# Patient Record
Sex: Female | Born: 1955
Health system: Southern US, Community
[De-identification: ages and names within clinical notes are randomized; demographics above are authoritative.]

## PROBLEM LIST (undated history)

## (undated) DIAGNOSIS — R011 Cardiac murmur, unspecified: Secondary | ICD-10-CM

## (undated) DIAGNOSIS — H269 Unspecified cataract: Secondary | ICD-10-CM

## (undated) DIAGNOSIS — H409 Unspecified glaucoma: Secondary | ICD-10-CM

## (undated) DIAGNOSIS — G4730 Sleep apnea, unspecified: Secondary | ICD-10-CM

## (undated) DIAGNOSIS — E785 Hyperlipidemia, unspecified: Secondary | ICD-10-CM

## (undated) DIAGNOSIS — F32A Depression, unspecified: Secondary | ICD-10-CM

## (undated) DIAGNOSIS — T8859XA Other complications of anesthesia, initial encounter: Secondary | ICD-10-CM

## (undated) DIAGNOSIS — I341 Nonrheumatic mitral (valve) prolapse: Secondary | ICD-10-CM

## (undated) DIAGNOSIS — N390 Urinary tract infection, site not specified: Secondary | ICD-10-CM

## (undated) DIAGNOSIS — K219 Gastro-esophageal reflux disease without esophagitis: Secondary | ICD-10-CM

## (undated) DIAGNOSIS — E119 Type 2 diabetes mellitus without complications: Secondary | ICD-10-CM

## (undated) DIAGNOSIS — I10 Essential (primary) hypertension: Secondary | ICD-10-CM

## (undated) DIAGNOSIS — F419 Anxiety disorder, unspecified: Secondary | ICD-10-CM

## (undated) DIAGNOSIS — M1990 Unspecified osteoarthritis, unspecified site: Secondary | ICD-10-CM

## (undated) DIAGNOSIS — T7840XA Allergy, unspecified, initial encounter: Secondary | ICD-10-CM

## (undated) DIAGNOSIS — F319 Bipolar disorder, unspecified: Secondary | ICD-10-CM

## (undated) DIAGNOSIS — K5909 Other constipation: Secondary | ICD-10-CM

## (undated) DIAGNOSIS — J189 Pneumonia, unspecified organism: Secondary | ICD-10-CM

## (undated) DIAGNOSIS — F329 Major depressive disorder, single episode, unspecified: Secondary | ICD-10-CM

## (undated) DIAGNOSIS — I499 Cardiac arrhythmia, unspecified: Secondary | ICD-10-CM

## (undated) DIAGNOSIS — T4145XA Adverse effect of unspecified anesthetic, initial encounter: Secondary | ICD-10-CM

## (undated) HISTORY — DX: Sleep apnea, unspecified: G47.30

## (undated) HISTORY — DX: Unspecified glaucoma: H40.9

## (undated) HISTORY — PX: COLONOSCOPY: SHX174

## (undated) HISTORY — PX: TONSILLECTOMY: SUR1361

## (undated) HISTORY — DX: Allergy, unspecified, initial encounter: T78.40XA

## (undated) HISTORY — DX: Type 2 diabetes mellitus without complications: E11.9

## (undated) HISTORY — DX: Hyperlipidemia, unspecified: E78.5

## (undated) HISTORY — DX: Essential (primary) hypertension: I10

## (undated) HISTORY — DX: Depression, unspecified: F32.A

## (undated) HISTORY — DX: Unspecified cataract: H26.9

## (undated) HISTORY — DX: Nonrheumatic mitral (valve) prolapse: I34.1

## (undated) HISTORY — DX: Bipolar disorder, unspecified: F31.9

## (undated) HISTORY — PX: CATARACT EXTRACTION: SUR2

## (undated) HISTORY — DX: Major depressive disorder, single episode, unspecified: F32.9

---

## 1998-05-09 ENCOUNTER — Inpatient Hospital Stay (HOSPITAL_COMMUNITY): Admission: RE | Admit: 1998-05-09 | Discharge: 1998-05-10 | Payer: Self-pay | Admitting: Neurosurgery

## 1998-08-09 ENCOUNTER — Other Ambulatory Visit: Admission: RE | Admit: 1998-08-09 | Discharge: 1998-08-09 | Payer: Self-pay | Admitting: Family Medicine

## 1999-09-05 ENCOUNTER — Other Ambulatory Visit: Admission: RE | Admit: 1999-09-05 | Discharge: 1999-09-05 | Payer: Self-pay | Admitting: Family Medicine

## 1999-09-24 ENCOUNTER — Encounter: Admission: RE | Admit: 1999-09-24 | Discharge: 1999-09-24 | Payer: Self-pay | Admitting: Family Medicine

## 1999-09-24 ENCOUNTER — Encounter: Payer: Self-pay | Admitting: Family Medicine

## 1999-11-10 HISTORY — PX: BACK SURGERY: SHX140

## 2000-05-17 ENCOUNTER — Encounter: Payer: Self-pay | Admitting: Family Medicine

## 2000-10-07 ENCOUNTER — Encounter: Admission: RE | Admit: 2000-10-07 | Discharge: 2000-10-07 | Payer: Self-pay | Admitting: Family Medicine

## 2000-10-07 ENCOUNTER — Encounter: Payer: Self-pay | Admitting: Family Medicine

## 2000-11-09 HISTORY — PX: ABDOMINAL HYSTERECTOMY: SHX81

## 2000-11-10 ENCOUNTER — Encounter (INDEPENDENT_AMBULATORY_CARE_PROVIDER_SITE_OTHER): Payer: Self-pay

## 2000-11-10 ENCOUNTER — Other Ambulatory Visit: Admission: RE | Admit: 2000-11-10 | Discharge: 2000-11-10 | Payer: Self-pay | Admitting: Obstetrics & Gynecology

## 2000-12-14 ENCOUNTER — Encounter (INDEPENDENT_AMBULATORY_CARE_PROVIDER_SITE_OTHER): Payer: Self-pay

## 2000-12-14 ENCOUNTER — Inpatient Hospital Stay (HOSPITAL_COMMUNITY): Admission: RE | Admit: 2000-12-14 | Discharge: 2000-12-16 | Payer: Self-pay | Admitting: Obstetrics & Gynecology

## 2001-03-28 ENCOUNTER — Encounter: Payer: Self-pay | Admitting: Internal Medicine

## 2001-03-28 ENCOUNTER — Inpatient Hospital Stay (HOSPITAL_COMMUNITY): Admission: EM | Admit: 2001-03-28 | Discharge: 2001-04-05 | Payer: Self-pay | Admitting: Psychiatry

## 2001-04-06 ENCOUNTER — Other Ambulatory Visit (HOSPITAL_COMMUNITY): Admission: RE | Admit: 2001-04-06 | Discharge: 2001-04-15 | Payer: Self-pay | Admitting: Psychiatry

## 2001-07-27 ENCOUNTER — Emergency Department (HOSPITAL_COMMUNITY): Admission: EM | Admit: 2001-07-27 | Discharge: 2001-07-27 | Payer: Self-pay | Admitting: Emergency Medicine

## 2001-07-27 ENCOUNTER — Encounter: Payer: Self-pay | Admitting: Emergency Medicine

## 2002-10-05 ENCOUNTER — Encounter: Payer: Self-pay | Admitting: Emergency Medicine

## 2002-10-05 ENCOUNTER — Emergency Department (HOSPITAL_COMMUNITY): Admission: EM | Admit: 2002-10-05 | Discharge: 2002-10-05 | Payer: Self-pay | Admitting: Emergency Medicine

## 2002-10-17 ENCOUNTER — Encounter: Admission: RE | Admit: 2002-10-17 | Discharge: 2003-01-15 | Payer: Self-pay | Admitting: Family Medicine

## 2003-01-16 ENCOUNTER — Encounter: Admission: RE | Admit: 2003-01-16 | Discharge: 2003-04-16 | Payer: Self-pay | Admitting: Family Medicine

## 2003-04-18 ENCOUNTER — Encounter: Admission: RE | Admit: 2003-04-18 | Discharge: 2003-07-17 | Payer: Self-pay | Admitting: Family Medicine

## 2003-08-01 ENCOUNTER — Encounter: Admission: RE | Admit: 2003-08-01 | Discharge: 2003-10-25 | Payer: Self-pay | Admitting: Family Medicine

## 2003-12-19 ENCOUNTER — Encounter: Admission: RE | Admit: 2003-12-19 | Discharge: 2004-03-18 | Payer: Self-pay | Admitting: Family Medicine

## 2004-12-22 ENCOUNTER — Encounter: Admission: RE | Admit: 2004-12-22 | Discharge: 2004-12-22 | Payer: Self-pay | Admitting: Family Medicine

## 2004-12-22 ENCOUNTER — Encounter: Payer: Self-pay | Admitting: Family Medicine

## 2006-11-09 DIAGNOSIS — Z86010 Personal history of colonic polyps: Secondary | ICD-10-CM

## 2006-11-09 DIAGNOSIS — Z860101 Personal history of adenomatous and serrated colon polyps: Secondary | ICD-10-CM

## 2006-11-09 HISTORY — DX: Personal history of adenomatous and serrated colon polyps: Z86.0101

## 2006-11-09 HISTORY — DX: Personal history of colonic polyps: Z86.010

## 2006-11-11 ENCOUNTER — Ambulatory Visit: Payer: Self-pay | Admitting: Internal Medicine

## 2006-12-10 ENCOUNTER — Encounter (INDEPENDENT_AMBULATORY_CARE_PROVIDER_SITE_OTHER): Payer: Self-pay | Admitting: *Deleted

## 2006-12-10 ENCOUNTER — Ambulatory Visit: Payer: Self-pay | Admitting: Internal Medicine

## 2007-01-31 ENCOUNTER — Other Ambulatory Visit (HOSPITAL_COMMUNITY): Admission: RE | Admit: 2007-01-31 | Discharge: 2007-05-01 | Payer: Self-pay | Admitting: Psychiatry

## 2007-01-31 ENCOUNTER — Ambulatory Visit: Payer: Self-pay | Admitting: Psychiatry

## 2008-03-12 ENCOUNTER — Encounter: Payer: Self-pay | Admitting: Family Medicine

## 2008-04-11 ENCOUNTER — Encounter: Payer: Self-pay | Admitting: Family Medicine

## 2008-04-12 ENCOUNTER — Encounter: Payer: Self-pay | Admitting: Family Medicine

## 2008-09-11 ENCOUNTER — Encounter: Payer: Self-pay | Admitting: Family Medicine

## 2008-09-11 ENCOUNTER — Encounter (INDEPENDENT_AMBULATORY_CARE_PROVIDER_SITE_OTHER): Payer: Self-pay | Admitting: *Deleted

## 2008-09-11 LAB — CONVERTED CEMR LAB
ALT: 29 units/L
Albumin: 4.5 g/dL
Alkaline Phosphatase: 42 units/L
BUN: 16 mg/dL
CO2, serum: 24 mmol/L
Calcium: 9.2 mg/dL
Chloride, Serum: 102 mmol/L
HDL: 52 mg/dL
Potassium, serum: 4.7 mmol/L
Sodium, serum: 140 mmol/L
Triglycerides: 449 mg/dL

## 2008-09-19 ENCOUNTER — Encounter (INDEPENDENT_AMBULATORY_CARE_PROVIDER_SITE_OTHER): Payer: Self-pay | Admitting: *Deleted

## 2008-10-10 ENCOUNTER — Encounter (INDEPENDENT_AMBULATORY_CARE_PROVIDER_SITE_OTHER): Payer: Self-pay | Admitting: *Deleted

## 2008-10-10 ENCOUNTER — Ambulatory Visit: Payer: Self-pay | Admitting: Family Medicine

## 2008-10-10 DIAGNOSIS — E785 Hyperlipidemia, unspecified: Secondary | ICD-10-CM | POA: Insufficient documentation

## 2008-10-10 DIAGNOSIS — F319 Bipolar disorder, unspecified: Secondary | ICD-10-CM | POA: Insufficient documentation

## 2008-10-10 DIAGNOSIS — F988 Other specified behavioral and emotional disorders with onset usually occurring in childhood and adolescence: Secondary | ICD-10-CM | POA: Insufficient documentation

## 2008-10-11 ENCOUNTER — Ambulatory Visit: Payer: Self-pay | Admitting: Family Medicine

## 2008-10-15 ENCOUNTER — Telehealth (INDEPENDENT_AMBULATORY_CARE_PROVIDER_SITE_OTHER): Payer: Self-pay | Admitting: *Deleted

## 2008-10-15 LAB — CONVERTED CEMR LAB
Alkaline Phosphatase: 22 units/L — ABNORMAL LOW (ref 39–117)
Basophils Absolute: 0 10*3/uL (ref 0.0–0.1)
Bilirubin, Direct: 0.1 mg/dL (ref 0.0–0.3)
CO2: 27 meq/L (ref 19–32)
Calcium: 9.3 mg/dL (ref 8.4–10.5)
Chloride: 105 meq/L (ref 96–112)
Cholesterol: 269 mg/dL (ref 0–200)
Creatinine, Ser: 0.7 mg/dL (ref 0.4–1.2)
Glucose, Bld: 87 mg/dL (ref 70–99)
HCT: 38.7 % (ref 36.0–46.0)
HDL: 50.3 mg/dL (ref >39.0)
Hemoglobin: 13.2 g/dL (ref 12.0–15.0)
MCV: 93.8 fL (ref 78.0–100.0)
Monocytes Absolute: 0.3 10*3/uL (ref 0.1–1.0)
Neutrophils Relative %: 66.4 % (ref 43.0–77.0)
Platelets: 218 10*3/uL (ref 150–400)
Potassium: 4.2 meq/L (ref 3.5–5.1)
RBC: 4.12 M/uL (ref 3.87–5.11)
TSH: 1.55 microintl units/mL (ref 0.35–5.50)
Triglycerides: 244 mg/dL (ref 0–149)

## 2008-10-16 ENCOUNTER — Telehealth (INDEPENDENT_AMBULATORY_CARE_PROVIDER_SITE_OTHER): Payer: Self-pay | Admitting: *Deleted

## 2008-11-19 ENCOUNTER — Encounter (INDEPENDENT_AMBULATORY_CARE_PROVIDER_SITE_OTHER): Payer: Self-pay | Admitting: *Deleted

## 2008-11-19 ENCOUNTER — Ambulatory Visit: Payer: Self-pay | Admitting: Family Medicine

## 2008-11-19 DIAGNOSIS — N760 Acute vaginitis: Secondary | ICD-10-CM | POA: Insufficient documentation

## 2008-11-19 LAB — CONVERTED CEMR LAB
ALT: 16 units/L (ref 0–35)
AST: 17 units/L (ref 0–37)
Albumin: 4.4 g/dL (ref 3.5–5.2)
Bilirubin, Direct: 0.1 mg/dL (ref 0.0–0.3)
Total Bilirubin: 0.9 mg/dL (ref 0.3–1.2)
Total Protein: 7.5 g/dL (ref 6.0–8.3)
Whiff Test: POSITIVE

## 2008-11-27 ENCOUNTER — Ambulatory Visit: Payer: Self-pay | Admitting: Family Medicine

## 2008-11-27 DIAGNOSIS — N390 Urinary tract infection, site not specified: Secondary | ICD-10-CM | POA: Insufficient documentation

## 2008-11-27 LAB — CONVERTED CEMR LAB
Bilirubin Urine: NEGATIVE
Protein, U semiquant: NEGATIVE
pH: 8

## 2008-11-28 ENCOUNTER — Encounter: Payer: Self-pay | Admitting: Family Medicine

## 2008-11-28 ENCOUNTER — Encounter (INDEPENDENT_AMBULATORY_CARE_PROVIDER_SITE_OTHER): Payer: Self-pay | Admitting: *Deleted

## 2008-12-03 ENCOUNTER — Encounter: Payer: Self-pay | Admitting: Family Medicine

## 2008-12-04 ENCOUNTER — Telehealth (INDEPENDENT_AMBULATORY_CARE_PROVIDER_SITE_OTHER): Payer: Self-pay | Admitting: *Deleted

## 2008-12-18 ENCOUNTER — Telehealth (INDEPENDENT_AMBULATORY_CARE_PROVIDER_SITE_OTHER): Payer: Self-pay | Admitting: *Deleted

## 2009-01-03 ENCOUNTER — Ambulatory Visit: Payer: Self-pay | Admitting: Family Medicine

## 2009-01-03 DIAGNOSIS — M6281 Muscle weakness (generalized): Secondary | ICD-10-CM | POA: Insufficient documentation

## 2009-01-03 DIAGNOSIS — R631 Polydipsia: Secondary | ICD-10-CM | POA: Insufficient documentation

## 2009-01-03 LAB — CONVERTED CEMR LAB: Heterophile Ab Screen: NEGATIVE

## 2009-01-04 ENCOUNTER — Encounter (INDEPENDENT_AMBULATORY_CARE_PROVIDER_SITE_OTHER): Payer: Self-pay | Admitting: *Deleted

## 2009-01-04 LAB — CONVERTED CEMR LAB
BUN: 20 mg/dL (ref 6–23)
Basophils Absolute: 0 10*3/uL (ref 0.0–0.1)
Basophils Relative: 0.4 % (ref 0.0–3.0)
CO2: 26 meq/L (ref 19–32)
Creatinine, Ser: 0.8 mg/dL (ref 0.4–1.2)
Eosinophils Absolute: 0.1 10*3/uL (ref 0.0–0.7)
GFR calc non Af Amer: 80 mL/min
Glucose, Bld: 89 mg/dL (ref 70–99)
HCT: 39.1 % (ref 36.0–46.0)
MCV: 93.1 fL (ref 78.0–100.0)
Monocytes Relative: 7.2 % (ref 3.0–12.0)
RBC: 4.2 M/uL (ref 3.87–5.11)
RDW: 12.6 % (ref 11.5–14.6)
Sodium: 142 meq/L (ref 135–145)
TSH: 1.11 microintl units/mL (ref 0.35–5.50)
Total CK: 73 units/L (ref 7–177)
WBC: 5.1 10*3/uL (ref 4.5–10.5)

## 2009-01-16 ENCOUNTER — Ambulatory Visit: Payer: Self-pay | Admitting: Family Medicine

## 2009-02-04 ENCOUNTER — Encounter: Payer: Self-pay | Admitting: Family Medicine

## 2009-02-13 ENCOUNTER — Encounter: Payer: Self-pay | Admitting: Family Medicine

## 2009-02-14 ENCOUNTER — Encounter: Payer: Self-pay | Admitting: Family Medicine

## 2009-02-15 ENCOUNTER — Encounter: Payer: Self-pay | Admitting: Family Medicine

## 2009-02-28 ENCOUNTER — Telehealth (INDEPENDENT_AMBULATORY_CARE_PROVIDER_SITE_OTHER): Payer: Self-pay | Admitting: *Deleted

## 2009-04-09 ENCOUNTER — Telehealth (INDEPENDENT_AMBULATORY_CARE_PROVIDER_SITE_OTHER): Payer: Self-pay | Admitting: *Deleted

## 2009-05-01 ENCOUNTER — Ambulatory Visit: Payer: Self-pay | Admitting: Family Medicine

## 2009-05-01 LAB — CONVERTED CEMR LAB

## 2009-06-14 ENCOUNTER — Ambulatory Visit: Payer: Self-pay | Admitting: Family Medicine

## 2009-06-14 DIAGNOSIS — J309 Allergic rhinitis, unspecified: Secondary | ICD-10-CM | POA: Insufficient documentation

## 2009-06-18 ENCOUNTER — Telehealth (INDEPENDENT_AMBULATORY_CARE_PROVIDER_SITE_OTHER): Payer: Self-pay | Admitting: *Deleted

## 2009-06-18 LAB — CONVERTED CEMR LAB
AST: 14 units/L (ref 0–37)
Albumin: 4.5 g/dL (ref 3.5–5.2)
Bilirubin, Direct: 0.1 mg/dL (ref 0.0–0.3)
Cholesterol: 219 mg/dL — ABNORMAL HIGH (ref 0–200)
Direct LDL: 153.5 mg/dL
HDL: 52.6 mg/dL (ref >39.00)
Triglycerides: 126 mg/dL (ref 0.0–149.0)

## 2009-07-18 ENCOUNTER — Ambulatory Visit: Payer: Self-pay | Admitting: Internal Medicine

## 2009-07-18 DIAGNOSIS — J069 Acute upper respiratory infection, unspecified: Secondary | ICD-10-CM | POA: Insufficient documentation

## 2009-08-19 ENCOUNTER — Ambulatory Visit: Payer: Self-pay | Admitting: Family Medicine

## 2009-08-22 LAB — CONVERTED CEMR LAB
AST: 18 units/L (ref 0–37)
Bilirubin, Direct: 0.1 mg/dL (ref 0.0–0.3)
Total Bilirubin: 0.7 mg/dL (ref 0.3–1.2)
Total Protein: 7.3 g/dL (ref 6.0–8.3)

## 2009-08-26 ENCOUNTER — Encounter: Payer: Self-pay | Admitting: Family Medicine

## 2009-08-30 ENCOUNTER — Telehealth (INDEPENDENT_AMBULATORY_CARE_PROVIDER_SITE_OTHER): Payer: Self-pay | Admitting: *Deleted

## 2009-09-09 ENCOUNTER — Encounter: Admission: RE | Admit: 2009-09-09 | Discharge: 2009-09-09 | Payer: Self-pay | Admitting: Otolaryngology

## 2009-10-30 ENCOUNTER — Ambulatory Visit: Payer: Self-pay | Admitting: Family

## 2009-10-30 DIAGNOSIS — R29818 Other symptoms and signs involving the nervous system: Secondary | ICD-10-CM | POA: Insufficient documentation

## 2009-12-16 ENCOUNTER — Encounter: Payer: Self-pay | Admitting: Family Medicine

## 2009-12-19 ENCOUNTER — Encounter: Payer: Self-pay | Admitting: Family Medicine

## 2009-12-20 ENCOUNTER — Encounter (INDEPENDENT_AMBULATORY_CARE_PROVIDER_SITE_OTHER): Payer: Self-pay | Admitting: *Deleted

## 2009-12-24 ENCOUNTER — Telehealth (INDEPENDENT_AMBULATORY_CARE_PROVIDER_SITE_OTHER): Payer: Self-pay | Admitting: *Deleted

## 2009-12-27 ENCOUNTER — Encounter: Admission: RE | Admit: 2009-12-27 | Discharge: 2009-12-27 | Payer: Self-pay | Admitting: Otolaryngology

## 2010-01-07 HISTORY — PX: NASAL SINUS SURGERY: SHX719

## 2010-01-14 ENCOUNTER — Ambulatory Visit (HOSPITAL_BASED_OUTPATIENT_CLINIC_OR_DEPARTMENT_OTHER): Admission: RE | Admit: 2010-01-14 | Discharge: 2010-01-14 | Payer: Self-pay | Admitting: Otolaryngology

## 2010-01-31 ENCOUNTER — Encounter: Admission: RE | Admit: 2010-01-31 | Discharge: 2010-01-31 | Payer: Self-pay | Admitting: Otolaryngology

## 2010-02-13 ENCOUNTER — Ambulatory Visit (HOSPITAL_BASED_OUTPATIENT_CLINIC_OR_DEPARTMENT_OTHER): Admission: RE | Admit: 2010-02-13 | Discharge: 2010-02-13 | Payer: Self-pay | Admitting: Otolaryngology

## 2010-02-24 ENCOUNTER — Ambulatory Visit: Payer: Self-pay | Admitting: Family Medicine

## 2010-02-24 DIAGNOSIS — B379 Candidiasis, unspecified: Secondary | ICD-10-CM | POA: Insufficient documentation

## 2010-02-27 ENCOUNTER — Telehealth (INDEPENDENT_AMBULATORY_CARE_PROVIDER_SITE_OTHER): Payer: Self-pay | Admitting: *Deleted

## 2010-02-27 LAB — CONVERTED CEMR LAB
ALT: 18 units/L (ref 0–35)
Albumin: 4.7 g/dL (ref 3.5–5.2)
Alkaline Phosphatase: 42 units/L (ref 39–117)
Basophils Relative: 0.3 % (ref 0.0–3.0)
Bilirubin, Direct: 0.1 mg/dL (ref 0.0–0.3)
CO2: 28 meq/L (ref 19–32)
Calcium: 9.5 mg/dL (ref 8.4–10.5)
Chloride: 106 meq/L (ref 96–112)
Cholesterol: 222 mg/dL — ABNORMAL HIGH (ref 0–200)
Eosinophils Relative: 1.7 % (ref 0.0–5.0)
GFR calc non Af Amer: 79.89 mL/min (ref >60)
Lymphocytes Relative: 18.7 % (ref 12.0–46.0)
Lymphs Abs: 1 10*3/uL (ref 0.7–4.0)
MCHC: 35.1 g/dL (ref 30.0–36.0)
MCV: 89.9 fL (ref 78.0–100.0)
Monocytes Absolute: 0.4 10*3/uL (ref 0.1–1.0)
Neutro Abs: 3.9 10*3/uL (ref 1.4–7.7)
Neutrophils Relative %: 72.5 % (ref 43.0–77.0)
Platelets: 313 10*3/uL (ref 150.0–400.0)
Sodium: 141 meq/L (ref 135–145)
Triglycerides: 167 mg/dL — ABNORMAL HIGH (ref 0.0–149.0)
VLDL: 33.4 mg/dL (ref 0.0–40.0)

## 2010-03-06 ENCOUNTER — Encounter (INDEPENDENT_AMBULATORY_CARE_PROVIDER_SITE_OTHER): Payer: Self-pay | Admitting: *Deleted

## 2010-04-08 ENCOUNTER — Telehealth (INDEPENDENT_AMBULATORY_CARE_PROVIDER_SITE_OTHER): Payer: Self-pay | Admitting: *Deleted

## 2010-07-08 ENCOUNTER — Telehealth (INDEPENDENT_AMBULATORY_CARE_PROVIDER_SITE_OTHER): Payer: Self-pay | Admitting: *Deleted

## 2010-07-17 ENCOUNTER — Ambulatory Visit: Payer: Self-pay | Admitting: Family Medicine

## 2010-07-17 DIAGNOSIS — F329 Major depressive disorder, single episode, unspecified: Secondary | ICD-10-CM | POA: Insufficient documentation

## 2010-07-17 DIAGNOSIS — R059 Cough, unspecified: Secondary | ICD-10-CM | POA: Insufficient documentation

## 2010-07-17 DIAGNOSIS — N951 Menopausal and female climacteric states: Secondary | ICD-10-CM | POA: Insufficient documentation

## 2010-07-17 DIAGNOSIS — R05 Cough: Secondary | ICD-10-CM

## 2010-07-18 LAB — CONVERTED CEMR LAB
Basophils Relative: 0.4 % (ref 0.0–3.0)
HCT: 37.2 % (ref 36.0–46.0)
Hemoglobin: 12.8 g/dL (ref 12.0–15.0)
Lymphocytes Relative: 20.9 % (ref 12.0–46.0)
Lymphs Abs: 1 10*3/uL (ref 0.7–4.0)
MCV: 92.2 fL (ref 78.0–100.0)
Monocytes Absolute: 0.3 10*3/uL (ref 0.1–1.0)
Monocytes Relative: 6.6 % (ref 3.0–12.0)
Neutro Abs: 3.3 10*3/uL (ref 1.4–7.7)
Neutrophils Relative %: 71.1 % (ref 43.0–77.0)
RBC: 4.04 M/uL (ref 3.87–5.11)
RDW: 13.6 % (ref 11.5–14.6)
WBC: 4.7 10*3/uL (ref 4.5–10.5)

## 2010-08-11 ENCOUNTER — Telehealth: Payer: Self-pay | Admitting: Family Medicine

## 2010-08-25 ENCOUNTER — Ambulatory Visit: Payer: Self-pay | Admitting: Family Medicine

## 2010-08-28 ENCOUNTER — Telehealth: Payer: Self-pay | Admitting: Family Medicine

## 2010-09-02 ENCOUNTER — Telehealth (INDEPENDENT_AMBULATORY_CARE_PROVIDER_SITE_OTHER): Payer: Self-pay | Admitting: *Deleted

## 2010-09-15 ENCOUNTER — Ambulatory Visit: Payer: Self-pay | Admitting: Family Medicine

## 2010-09-15 DIAGNOSIS — E559 Vitamin D deficiency, unspecified: Secondary | ICD-10-CM | POA: Insufficient documentation

## 2010-09-17 LAB — CONVERTED CEMR LAB
Basophils Relative: 0.3 % (ref 0.0–3.0)
Eosinophils Absolute: 0.1 10*3/uL (ref 0.0–0.7)
Eosinophils Relative: 2.1 % (ref 0.0–5.0)
Lymphocytes Relative: 27.4 % (ref 12.0–46.0)
Lymphs Abs: 1.2 10*3/uL (ref 0.7–4.0)
MCV: 91.7 fL (ref 78.0–100.0)
Neutrophils Relative %: 62.4 % (ref 43.0–77.0)
RDW: 13.6 % (ref 11.5–14.6)

## 2010-10-31 ENCOUNTER — Telehealth (INDEPENDENT_AMBULATORY_CARE_PROVIDER_SITE_OTHER): Payer: Self-pay | Admitting: *Deleted

## 2010-11-28 ENCOUNTER — Encounter
Admission: RE | Admit: 2010-11-28 | Discharge: 2010-11-28 | Payer: Self-pay | Source: Home / Self Care | Attending: Orthopedic Surgery | Admitting: Orthopedic Surgery

## 2010-12-09 NOTE — Progress Notes (Signed)
Summary: labs  Phone Note Outgoing Call   Call placed by: Doristine Devoid,  February 27, 2010 3:44 PM Summary of Call: total cholesterol, LDL, and triglycerides are elevated.  pt on Pravastatin but needs to switch to Crestor 10mg .  Please give her a month of samples and coupon card if available.  level is slightly low.  start 50,000 units weekly x8 weeks.  then repeat level  Follow-up for Phone Call        left message on machine ........Marland KitchenDoristine Devoid  February 27, 2010 3:45 PM   spoke w/ patient aware of labs and start of medication left samples and coupon card up front for pick up along w/ copy of labs....Marland KitchenMarland KitchenDoristine Devoid  February 27, 2010 4:07 PM     New/Updated Medications: CRESTOR 10 MG TABS (ROSUVASTATIN CALCIUM) take one tablet at bedtime VITAMIN D (ERGOCALCIFEROL) 50000 UNIT CAPS (ERGOCALCIFEROL) take one tablet weekly Prescriptions: VITAMIN D (ERGOCALCIFEROL) 50000 UNIT CAPS (ERGOCALCIFEROL) take one tablet weekly  #8 x 0   Entered by:   Doristine Devoid   Authorized by:   Neena Rhymes MD   Signed by:   Doristine Devoid on 02/27/2010   Method used:   Electronically to        [facility]* (retail)       [address]       [place], [place]  [address]       Ph: [phone] or [phone]       Fax: [phone]   RxID:   [id number] CRESTOR 10 MG TABS (ROSUVASTATIN CALCIUM) take one tablet at bedtime  #30 x 1   Entered by:   Doristine Devoid   Authorized by:   Neena Rhymes MD   Signed by:   Doristine Devoid on 02/27/2010   Method used:   Samples Given   RxID:   [id number]

## 2010-12-09 NOTE — Assessment & Plan Note (Signed)
Summary: HEAD COLD AND COUGH//PH   Vital Signs:  Patient profile:   55 year old female Weight:      222 pounds Temp:     98.9 degrees F oral BP sitting:   116 / 72  (left arm)  Vitals Entered By: Doristine Devoid CMA (August 25, 2010 3:59 PM) CC: sinus congestion and cough x1 week    History of Present Illness: 55 yo woman here today for URI.  sxs started 1 week ago w/ head congestion, nasal congestion, cough.  + sick contacts.  cough is productive of white sputum.  no fevers.  denies facial pain/pressure.  no N/V/D.  no ear pain.   Allergies (verified): 1)  ! Xanax  Review of Systems      See HPI  Physical Exam  General:  Well-developed,well-nourished,in no acute distress; alert,appropriate and cooperative throughout examination Head:  Normocephalic and atraumatic without obvious abnormalities. No apparent alopecia or balding.  no TTP over sinuses Eyes:  no injxn or inflammation Ears:  External ear exam shows no significant lesions or deformities.  Otoscopic examination reveals clear canals, tympanic membranes are intact bilaterally without bulging, retraction, inflammation or discharge. Hearing is grossly normal bilaterally. Nose:  mild congestion Mouth:  Oral mucosa and oropharynx without lesions or exudates.  Teeth in good repair. Neck:  No deformities, masses, or tenderness noted. Lungs:  Normal respiratory effort, chest expands symmetrically. Lungs are clear to auscultation, no crackles or wheezes.  + hacking cough Heart:  Normal rate and regular rhythm. S1 and S2 normal without gallop, murmur, click, rub or other extra sounds.   Impression & Recommendations:  Problem # 1:  BRONCHITIS- ACUTE (ICD-466.0) Assessment New pt's sxs consistent w/ bronchitis.  in grey area between viral and bacterial- but given 7 days of sxs and no improvement suspect bacterial.  will start Zpack.  tessalon as needed for cough.  if pt needs codeine syrup she is to call.  reviewed supportive care  and red flags that should prompt return.  Pt expresses understanding and is in agreement w/ this plan. Her updated medication list for this problem includes:    Singulair 10 Mg Tabs (Montelukast sodium) .Marland Kitchen... Take one tablet daily    Azithromycin 250 Mg Tabs (Azithromycin) .Marland Kitchen... 2 by  mouth today and then 1 daily for 4 days    Tessalon 200 Mg Caps (Benzonatate) .Marland Kitchen... Take one capsule by mouth three times a day as needed for cough  Complete Medication List: 1)  Seroquel 400 Mg Tabs (Quetiapine fumarate) .... Take one tablet daily and one tablet at bedtime 2)  Lamictal 200 Mg Tabs (Lamotrigine) .... Take one tablet two times a day 3)  Effexor 37.5 Mg Tabs (Venlafaxine hcl) .... Take  one tablet three times a day 4)  Klonopin 1 Mg Tabs (Clonazepam) .... Take one tablet qid as needed 5)  Atenolol 50 Mg Tabs (Atenolol) .... Take one tablet two times a day 6)  Gemfibrozil 600 Mg Tabs (Gemfibrozil) .... Take one tablet two times a day 7)  Fish Oil 1000 Mg Caps (Omega-3 fatty acids) .... Take one tablet qid 8)  Crestor 10 Mg Tabs (Rosuvastatin calcium) .... Take one tablet at bedtime 9)  Omeprazole 40 Mg Cpdr (Omeprazole) .Marland Kitchen.. 1 tab by mouth daily 10)  Flonase 50 Mcg/act Susp (Fluticasone propionate) .... 2 sprays each nostil daily 11)  Singulair 10 Mg Tabs (Montelukast sodium) .... Take one tablet daily 12)  Xyzal 5 Mg Tabs (Levocetirizine dihydrochloride) .... Take 2 tablet  daily 13)  Nystop 100000 Unit/gm Powd (Nystatin) .... Apply to affected area two times a day.  disp 60 grams 14)  Vitamin D (ergocalciferol) 50000 Unit Caps (Ergocalciferol) .... Take one tablet weekly 15)  Azithromycin 250 Mg Tabs (Azithromycin) .... 2 by  mouth today and then 1 daily for 4 days 16)  Tessalon 200 Mg Caps (Benzonatate) .... Take one capsule by mouth three times a day as needed for cough  Patient Instructions: 1)  Follow up as needed 2)  This is likely a bronchitis and should improve w/ the Azithromycin 3)   Drink plenty of fluids 4)  Tessalon (cough pills) as needed.  If no improvement, call and we can do a cough syrup 5)  Continue your allergy meds 6)  Hang in there!!! Prescriptions: TESSALON 200 MG CAPS (BENZONATATE) Take one capsule by mouth three times a day as needed for cough  #60 x 0   Entered and Authorized by:   Neena Rhymes MD   Signed by:   Neena Rhymes MD on 08/25/2010   Method used:   Print then Give to Patient   RxID:   [id number] AZITHROMYCIN 250 MG  TABS (AZITHROMYCIN) 2 by  mouth today and then 1 daily for 4 days  #6 x 0   Entered and Authorized by:   Neena Rhymes MD   Signed by:   Neena Rhymes MD on 08/25/2010   Method used:   Print then Give to Patient   RxID:   [id number]    Orders Added: 1)  Est. Patient Level III [84696]

## 2010-12-09 NOTE — Progress Notes (Signed)
Summary: mailorder rx  Phone Note Refill Request Message from:  Patient  Refills Requested: Medication #1:  GEMFIBROZIL 600 MG TABS take one tablet two times a day has 10 days left need rx sent to [facility]  Initial call taken by: Kandice Hams,  Apr 08, 2010 11:58 AM    Prescriptions: GEMFIBROZIL 600 MG TABS (GEMFIBROZIL) take one tablet two times a day  #180 x 1   Entered by:   Kandice Hams   Authorized by:   Nolon Rod. Paz MD   Signed by:   Kandice Hams on 04/08/2010   Method used:   Faxed to ...       [facility]* (mail-order)             ,          Ph: [phone]       Fax: [phone]   RxID:   [id number]

## 2010-12-09 NOTE — Progress Notes (Signed)
Summary: still no better  Phone Note Call from Patient Call back at Work Phone [phone]   Caller: Patient Summary of Call: Pt states that she has one pill left and is planing to going out of town this weekend. Pt now c/o greenish/yellowish mucous. pt advise med does still work in systems several days after completion of med. Pt ok info but is afraid that she will get stuck out of town with no med and is concerned that mucous was once clear and now has turn green......................Marland KitchenFelecia Deloach CMA  August 28, 2010 11:58 AM   Follow-up for Phone Call        change in mucous color doesn't mean infxn is worsening.  normal progression is for mucous to turn from white to yellow to green back to yellow then back to white.  she should finish meds and give this more time.  if still no better by next week should call. Follow-up by: Neena Rhymes MD,  August 28, 2010 12:56 PM  Additional Follow-up for Phone Call Additional follow up Details #1::        discuss with patient....................Marland KitchenFelecia Deloach CMA  August 28, 2010 2:47 PM

## 2010-12-09 NOTE — Progress Notes (Signed)
Summary: Fax labs to [facility] per request  Phone Note Call from Patient Call back at Work Phone [phone]   Summary of Call: Patient left message on triage that she would like her labs sent to [facility] at [facility]--- [place] Fax # [phone]  Patient aware that her labs were faxed per request. Initial call taken by: Lucious Groves CMA,  August 11, 2010 12:36 PM

## 2010-12-09 NOTE — Miscellaneous (Signed)
  Clinical Lists Changes  Observations: Added new observation of MAMMOGRAM: normal (12/19/2009 14:21)      Preventive Care Screening  Mammogram:    Date:  12/19/2009    Results:  normal

## 2010-12-09 NOTE — Letter (Signed)
Summary: [facility]  [facility]   Imported By: Lanelle Bal 12/31/2009 10:26:44  _____________________________________________________________________  External Attachment:    Type:   Image     Comment:   External Document

## 2010-12-09 NOTE — Assessment & Plan Note (Signed)
Summary: still coughing.cbs   Vital Signs:  Patient profile:   55 year old female Weight:      227 pounds BMI:     34.14 O2 Sat:      95 % on Room air Temp:     98.4 degrees F oral Pulse rate:   94 / minute BP sitting:   122 / 78  (left arm)  Vitals Entered By: Doristine Devoid CMA (September 15, 2010 1:51 PM)  O2 Flow:  Room air CC: cough mainly dry but deep and fever blisters along w/ weakness    History of Present Illness: 55 yo woman here today for cough.  started 5 day course of Avelox on 10/25.  'i just don't feel good'.  reports weakness, fatigue.  has also broken out w/ cold sores- 2 days ago.  no fever.  not using Mucinex.  taking Allegra.  cough is dry.  using Tessalon w/ some relief.  is sleeping at night.  using Qvar 2 puffs two times a day.  pt has not felt well and had cough for almost 1 month and been on abx w/out relief.  Current Medications (verified): 1)  Seroquel 400 Mg Tabs (Quetiapine Fumarate) .... Take One Tablet Daily and One Tablet At Bedtime 2)  Lamictal 200 Mg Tabs (Lamotrigine) .... Take One Tablet Two Times A Day 3)  Effexor 37.5 Mg Tabs (Venlafaxine Hcl) .... Take  One Tablet Three Times A Day 4)  Klonopin 1 Mg Tabs (Clonazepam) .... Take One Tablet Qid As Needed 5)  Atenolol 50 Mg Tabs (Atenolol) .... Take One Tablet Two Times A Day 6)  Gemfibrozil 600 Mg Tabs (Gemfibrozil) .... Take One Tablet Two Times A Day 7)  Fish Oil 1000 Mg Caps (Omega-3 Fatty Acids) .... Take One Tablet Qid 8)  Crestor 10 Mg Tabs (Rosuvastatin Calcium) .... Take One Tablet At Bedtime 9)  Omeprazole 40 Mg Cpdr (Omeprazole) .Marland Kitchen.. 1 Tab By Mouth Daily 10)  Flonase 50 Mcg/act Susp (Fluticasone Propionate) .... 2 Sprays Each Nostil Daily 11)  Singulair 10 Mg Tabs (Montelukast Sodium) .... Take One Tablet Daily 12)  Xyzal 5 Mg Tabs (Levocetirizine Dihydrochloride) .... Take 2 Tablet Daily 13)  Nystop 100000 Unit/gm Powd (Nystatin) .... Apply To Affected Area Two Times A Day.  Disp 60  Grams 14)  Tessalon 200 Mg Caps (Benzonatate) .... Take One Capsule By Mouth Three Times A Day As Needed For Cough  Allergies (verified): 1)  ! Xanax  Review of Systems      See HPI  Physical Exam  General:  Well-developed,well-nourished,in no acute distress; alert,appropriate and cooperative throughout examination Head:  Normocephalic and atraumatic without obvious abnormalities. No apparent alopecia or balding.  no TTP over sinuses Ears:  External ear exam shows no significant lesions or deformities.  Otoscopic examination reveals clear canals, tympanic membranes are intact bilaterally without bulging, retraction, inflammation or discharge. Hearing is grossly normal bilaterally. Nose:  mild congestion Mouth:  Oral mucosa and oropharynx without lesions or exudates.  Teeth in good repair. Neck:  No deformities, masses, or tenderness noted. Lungs:  Normal respiratory effort, chest expands symmetrically. Lungs are clear to auscultation, no crackles or wheezes.  + hacking cough Heart:  Normal rate and regular rhythm. S1 and S2 normal without gallop, murmur, click, rub or other extra sounds.   Impression & Recommendations:  Problem # 1:  COUGH (ICD-786.2) Assessment Unchanged pt w/ persistant cough for  ~ 74month.  not consistent w/ post-infectious cough b/c pt  continues to feel poorly.  get CXR to evaluate.  continue tessalon, add Mucinex.  hold on additional abx until CXR results available.  check CBC to r/o infxn.  reviewed supportive care and red flags that should prompt return.  Pt expresses understanding and is in agreement w/ this plan. Orders: Venipuncture (13086) TLB-CBC Platelet - w/Differential (85025-CBCD) T-2 View CXR (71020TC) Specimen Handling (57846)  Problem # 2:  VITAMIN D DEFICIENCY (ICD-268.9) Assessment: New due for lab recheck Orders: T-Vitamin D (25-Hydroxy) (96295-28413) Specimen Handling (24401)  Complete Medication List: 1)  Seroquel 400 Mg Tabs  (Quetiapine fumarate) .... Take one tablet daily and one tablet at bedtime 2)  Lamictal 200 Mg Tabs (Lamotrigine) .... Take one tablet two times a day 3)  Effexor 37.5 Mg Tabs (Venlafaxine hcl) .... Take  one tablet three times a day 4)  Klonopin 1 Mg Tabs (Clonazepam) .... Take one tablet qid as needed 5)  Atenolol 50 Mg Tabs (Atenolol) .... Take one tablet two times a day 6)  Gemfibrozil 600 Mg Tabs (Gemfibrozil) .... Take one tablet two times a day 7)  Fish Oil 1000 Mg Caps (Omega-3 fatty acids) .... Take one tablet qid 8)  Crestor 10 Mg Tabs (Rosuvastatin calcium) .... Take one tablet at bedtime 9)  Omeprazole 40 Mg Cpdr (Omeprazole) .Marland Kitchen.. 1 tab by mouth daily 10)  Flonase 50 Mcg/act Susp (Fluticasone propionate) .... 2 sprays each nostil daily 11)  Singulair 10 Mg Tabs (Montelukast sodium) .... Take one tablet daily 12)  Xyzal 5 Mg Tabs (Levocetirizine dihydrochloride) .... Take 2 tablet daily 13)  Nystop 100000 Unit/gm Powd (Nystatin) .... Apply to affected area two times a day.  disp 60 grams 14)  Tessalon 200 Mg Caps (Benzonatate) .... Take one capsule by mouth three times a day as needed for cough  Patient Instructions: 1)  Go to [address] for your chest xray 2)  We'll notify you of the results 3)  Keep using the Tessalon for cough 4)  Add Mucinex to try and break up the cough 5)  We'll notify you of your blood work 6)  Use Abreva on the cold sores 7)  Hang in there!!! Prescriptions: TESSALON 200 MG CAPS (BENZONATATE) Take one capsule by mouth three times a day as needed for cough  #60 x 0   Entered and Authorized by:   Neena Rhymes MD   Signed by:   Neena Rhymes MD on 09/15/2010   Method used:   Electronically to        [id number]* (retail)       [address]       [place], [place]  [address]       Ph: [phone]       Fax: [phone]   RxID:   [id number]    Orders Added: 1)  Venipuncture [01601] 2)  TLB-CBC Platelet - w/Differential [85025-CBCD] 3)   T-Vitamin D (25-Hydroxy) [09323-55732] 4)  T-2 View CXR [71020TC] 5)  Specimen Handling [99000] 6)  Est. Patient Level III [20254]

## 2010-12-09 NOTE — Assessment & Plan Note (Signed)
Summary: CPX/YEARLY//PH   Vital Signs:  Patient profile:   55 year old female Height:      68.50 inches Weight:      211 pounds BMI:     31.73 Pulse rate:   74 / minute BP sitting:   124 / 76  (left arm)  Vitals Entered By: Doristine Devoid (February 24, 2010 8:29 AM) CC: CPX AND PAP W/ LAB    History of Present Illness: 55 yo woman here today for CPE.  concerned b/c father had MI at early age.  notes recent depression- having a hard time w/ her 2 recent surgeries and loss of her mom.  Preventive Screening-Counseling & Management  Alcohol-Tobacco     Smoking Status: never  Caffeine-Diet-Exercise     Does Patient Exercise: yes     Type of exercise: walking  Current Medications (verified): 1)  Seroquel 400 Mg Tabs (Quetiapine Fumarate) .... Take One Tablet Daily and One Tablet At Bedtime 2)  Lamictal 200 Mg Tabs (Lamotrigine) .... Take One Tablet Two Times A Day 3)  Effexor 37.5 Mg Tabs (Venlafaxine Hcl) .... Take  One Tablet Three Times A Day 4)  Klonopin 1 Mg Tabs (Clonazepam) .... Take One Tablet Qid As Needed 5)  Atenolol 50 Mg Tabs (Atenolol) .... Take One Tablet Two Times A Day 6)  Gemfibrozil 600 Mg Tabs (Gemfibrozil) .... Take One Tablet Two Times A Day 7)  Fish Oil 1000 Mg Caps (Omega-3 Fatty Acids) .... Take One Tablet Qid 8)  Pravastatin Sodium 40 Mg Tabs (Pravastatin Sodium) .... Take One Tablet At Bedtime 9)  Omeprazole 40 Mg Cpdr (Omeprazole) .Marland Kitchen.. 1 Tab By Mouth Daily 10)  Flonase 50 Mcg/act Susp (Fluticasone Propionate) .... 2 Sprays Each Nostil Daily 11)  Singulair 10 Mg Tabs (Montelukast Sodium) .... Take One Tablet Daily 12)  Xyzal 5 Mg Tabs (Levocetirizine Dihydrochloride) .... Take 2 Tablet Daily  Allergies (verified): 1)  ! Xanax  Past History:  Past Medical History: Last updated: 10/10/2008 Bipolar Disorder Valve Prolaspe Hyperlipidemia  Family History: Last updated: 10/10/2008 CAD-father MI 29 HTN-brother DM-no STROKE-no COLON CA-no BREAST  CA-no  Social History: Last updated: 10/10/2008 not a smoker, glass of wine weekly, no drugs lives alone w/ 2 dogs and cats retired from [employer], has pet sitting business.  Past Surgical History: Hysterectomy Tonsillectomy Back Surgery Sinus Surgey x2-01/2010  Social History: Does Patient Exercise:  yes  Review of Systems       The patient complains of prolonged cough and depression.  The patient denies anorexia, fever, weight loss, weight gain, vision loss, decreased hearing, hoarseness, chest pain, syncope, dyspnea on exertion, peripheral edema, headaches, abdominal pain, melena, hematochezia, severe indigestion/heartburn, hematuria, suspicious skin lesions, abnormal bleeding, enlarged lymph nodes, and breast masses.         cough variant asthma  Physical Exam  General:  Well-developed,well-nourished,in no acute distress; alert,appropriate and cooperative throughout examination Head:  Normocephalic and atraumatic without obvious abnormalities. No apparent alopecia or balding. Eyes:  No corneal or conjunctival inflammation noted. EOMI. Perrla.  Vision grossly normal. Ears:  External ear exam shows no significant lesions or deformities.  Otoscopic examination reveals clear canals, tympanic membranes are intact bilaterally without bulging, retraction, inflammation or discharge. Hearing is grossly normal bilaterally. Nose:  External nasal examination shows no deformity or inflammation. Nasal mucosa aredry without lesions or exudates. Mouth:  Oral mucosa and oropharynx without lesions or exudates.  Teeth in good repair. Neck:  No deformities, masses, or tenderness noted. Breasts:  breast exam WNL w/ exception of yeast infxn in breast fold bilaterally Lungs:  Normal respiratory effort, chest expands symmetrically. Lungs are clear to auscultation, no crackles or wheezes. Heart:  Normal rate and regular rhythm. S1 and S2 normal without gallop, murmur, click, rub or other extra  sounds. Abdomen:  Bowel sounds positive,abdomen soft and non-tender without masses, organomegaly or hernias noted. Genitalia:  normal bimanual exam without adnexa fullness noted Pulses:  +2 carotid, radial, DP Extremities:  no C/C/E Neurologic:  No cranial nerve deficits noted. Station and gait are normal. Plantar reflexes are down-going bilaterally. DTRs are symmetrical throughout. Sensory, motor and coordinative functions appear intact. Skin:  yeast infxn of breast folds Cervical Nodes:  No lymphadenopathy noted Axillary Nodes:  No palpable lymphadenopathy Psych:  Cognition and judgment appear intact. Alert and cooperative with normal attention span and concentration. No apparent delusions, illusions, hallucinations   Impression & Recommendations:  Problem # 1:  ROUTINE GYNECOLOGICAL EXAMINATION (ICD-V72.31) Assessment Unchanged pt's PE WNL w/ exception of breast fold yeast (see below).  check labs.  UTD on health maintainence.  anticipatory guidance provided. Orders: T-Vitamin D (25-Hydroxy) [phone]) TLB-BMP (Basic Metabolic Panel-BMET) (80048-METABOL) TLB-CBC Platelet - w/Differential (85025-CBCD) TLB-TSH (Thyroid Stimulating Hormone) (84443-TSH)  Problem # 2:  HYPERLIPIDEMIA (ICD-272.4) Assessment: Unchanged due for labs.  tolerating meds w/out difficulty.  due to family hx of MI and elevated cholesterol will refer for stress test. Her updated medication list for this problem includes:    Gemfibrozil 600 Mg Tabs (Gemfibrozil) .Marland Kitchen... Take one tablet two times a day    Pravastatin Sodium 40 Mg Tabs (Pravastatin sodium) .Marland Kitchen... Take one tablet at bedtime  Orders: Venipuncture (44010) Cardiology Referral (Cardiology) TLB-Lipid Panel (80061-LIPID) TLB-Hepatic/Liver Function Pnl (80076-HEPATIC) EKG w/ Interpretation (93000)  Problem # 3:  MYOCARDIAL INFARCTION, FAMILY HX (ICD-V17.3) Assessment: New refer for baseline stress test. Orders: Cardiology Referral  (Cardiology)  Problem # 4:  CANDIDIASIS (ICD-112.9) Assessment: New start nystatin powder and OTC miconazole cream.  Complete Medication List: 1)  Seroquel 400 Mg Tabs (Quetiapine fumarate) .... Take one tablet daily and one tablet at bedtime 2)  Lamictal 200 Mg Tabs (Lamotrigine) .... Take one tablet two times a day 3)  Effexor 37.5 Mg Tabs (Venlafaxine hcl) .... Take  one tablet three times a day 4)  Klonopin 1 Mg Tabs (Clonazepam) .... Take one tablet qid as needed 5)  Atenolol 50 Mg Tabs (Atenolol) .... Take one tablet two times a day 6)  Gemfibrozil 600 Mg Tabs (Gemfibrozil) .... Take one tablet two times a day 7)  Fish Oil 1000 Mg Caps (Omega-3 fatty acids) .... Take one tablet qid 8)  Pravastatin Sodium 40 Mg Tabs (Pravastatin sodium) .... Take one tablet at bedtime 9)  Omeprazole 40 Mg Cpdr (Omeprazole) .Marland Kitchen.. 1 tab by mouth daily 10)  Flonase 50 Mcg/act Susp (Fluticasone propionate) .... 2 sprays each nostil daily 11)  Singulair 10 Mg Tabs (Montelukast sodium) .... Take one tablet daily 12)  Xyzal 5 Mg Tabs (Levocetirizine dihydrochloride) .... Take 2 tablet daily 13)  Nystop 100000 Unit/gm Powd (Nystatin) .... Apply to affected area two times a day.  disp 60 grams  Patient Instructions: 1)  Please schedule a follow-up appointment in 6 months to recheck cholesterol- or as needed. 2)  Use the Nystatin powder as directed.  Alternate w/ OTC Miconazole cream two times a day. 3)  Try and keep the area clean and dry 4)  Focus on healthy diet and regular exercise 5)  Someone will call  you with your cardiology appt 6)  We'll notify you of your lab results 7)  Hang in there! 8)  Happy Odis Luster!  Prescriptions: NYSTOP 100000 UNIT/GM POWD (NYSTATIN) apply to affected area two times a day.  disp 60 grams  #60 x 1   Entered and Authorized by:   Neena Rhymes MD   Signed by:   Neena Rhymes MD on 02/24/2010   Method used:   Electronically to        [facility]  (retail)       [address]       [place], [place]  [address]       Ph: [phone] or [phone]       Fax: [phone]   RxID:   [id number]

## 2010-12-09 NOTE — Progress Notes (Signed)
Summary: gemfibrozil refill   Phone Note Refill Request Message from:  Patient on December 24, 2009 11:13 AM  Refills Requested: Medication #1:  GEMFIBROZIL 600 MG TABS take one tablet two times a day Initial call taken by: Doristine Devoid,  December 24, 2009 11:13 AM    Prescriptions: GEMFIBROZIL 600 MG TABS (GEMFIBROZIL) take one tablet two times a day  #180 x 0   Entered by:   Doristine Devoid   Authorized by:   Neena Rhymes MD   Signed by:   Doristine Devoid on 12/24/2009   Method used:   Electronically to        [facility]* (mail-order)             ,          Ph: [phone]       Fax: [phone]   RxID:   [id number]

## 2010-12-09 NOTE — Progress Notes (Signed)
Summary: still no better  Phone Note Call from Patient Call back at Work Phone [phone]   Caller: Patient Summary of Call: Pt left VM that she is still no better. Pt still c/o cough and congestion. pt request another med rx. pls advise............Marland KitchenFelecia Deloach CMA  September 02, 2010 3:22 PM   Follow-up for Phone Call        avelox 400mg   1 by mouth once daily for 5 days  ov after that if no better Follow-up by: Loreen Freud DO,  September 02, 2010 3:36 PM    New/Updated Medications: AVELOX 400 MG TABS (MOXIFLOXACIN HCL) 1 by mouth once daily Prescriptions: AVELOX 400 MG TABS (MOXIFLOXACIN HCL) 1 by mouth once daily  #5 x 0   Entered by:   Doristine Devoid CMA   Authorized by:   Loreen Freud DO   Signed by:   Doristine Devoid CMA on 09/02/2010   Method used:   Electronically to        [id number]* (retail)       [address]       [place], [place]  [address]       Ph: [phone]       Fax: [phone]   RxID:   [id number] AVELOX 400 MG TABS (MOXIFLOXACIN HCL) 1 by mouth once daily  #5 x 0   Entered and Authorized by:   Loreen Freud DO   Signed by:   Loreen Freud DO on 09/02/2010   Method used:   Historical   RxID:   [id number]

## 2010-12-09 NOTE — Progress Notes (Signed)
Summary: crestor and atenolol refill   Phone Note Call from Patient   Caller: Patient Summary of Call: Pt left message on Triage line requesting a 3 month supply of Crestor and Attenenol be sent to [facility]. Also if she could have a few samples of Crestor she would appreciate it. Pt can be contacted at [phone]. Initial call taken by: Lavell Islam,  July 08, 2010 3:29 PM  Follow-up for Phone Call        left detailed msg prescriptions sent to pharmacy and samples left up front for pick up.......Marland KitchenDoristine Devoid CMA  July 09, 2010 12:20 PM     Prescriptions: CRESTOR 10 MG TABS (ROSUVASTATIN CALCIUM) take one tablet at bedtime  #90 x 0   Entered by:   Lucious Groves CMA   Authorized by:   Neena Rhymes MD   Signed by:   Lucious Groves CMA on 07/09/2010   Method used:   Faxed to ...       [facility] (mail-order)             , [place]         Ph: [phone]       Fax: [phone]   RxID:   [id number] ATENOLOL 50 MG TABS (ATENOLOL) take one tablet two times a day  #90 x 0   Entered by:   Lucious Groves CMA   Authorized by:   Neena Rhymes MD   Signed by:   Lucious Groves CMA on 07/09/2010   Method used:   Faxed to ...       [facility] (mail-order)             , [place]         Ph: [phone]       Fax: [phone]   RxID:   [id number]

## 2010-12-09 NOTE — Assessment & Plan Note (Signed)
Summary: night sweats,depressed/cbs   Vital Signs:  Patient profile:   55 year old female Weight:      216 pounds O2 Sat:      91 % on Room air Pulse rate:   100 / minute BP sitting:   120 / 82  (left arm)  Vitals Entered By: Doristine Devoid CMA (July 17, 2010 11:11 AM)  O2 Flow:  Room air CC: sweating and feeling sluggish somewhat emotional    History of Present Illness: 55 yo woman here today for 'feeling sluggish' and sweating.  sxs started 2 months ago.  also c/o increased depression.  pt isn't certain of her menopausal status due to hysterectomy- but ovaries remain.  hasn't talked w/ psych about her depression- anniversary of mom's death just passed.  no fevers.  + cough- hx of allergy/asthma, follows w/ Dr [place] Callas.  no sinus pain or pressure.  denies CP, SOB, N/V/D.  sweating will occur in waves, having night sweats.  also having HAs- relates this to her stress level.  HA is frontal.  no visual changes, dizziness.  Current Medications (verified): 1)  Seroquel 400 Mg Tabs (Quetiapine Fumarate) .... Take One Tablet Daily and One Tablet At Bedtime 2)  Lamictal 200 Mg Tabs (Lamotrigine) .... Take One Tablet Two Times A Day 3)  Effexor 37.5 Mg Tabs (Venlafaxine Hcl) .... Take  One Tablet Three Times A Day 4)  Klonopin 1 Mg Tabs (Clonazepam) .... Take One Tablet Qid As Needed 5)  Atenolol 50 Mg Tabs (Atenolol) .... Take One Tablet Two Times A Day 6)  Gemfibrozil 600 Mg Tabs (Gemfibrozil) .... Take One Tablet Two Times A Day 7)  Fish Oil 1000 Mg Caps (Omega-3 Fatty Acids) .... Take One Tablet Qid 8)  Crestor 10 Mg Tabs (Rosuvastatin Calcium) .... Take One Tablet At Bedtime 9)  Omeprazole 40 Mg Cpdr (Omeprazole) .Marland Kitchen.. 1 Tab By Mouth Daily 10)  Flonase 50 Mcg/act Susp (Fluticasone Propionate) .... 2 Sprays Each Nostil Daily 11)  Singulair 10 Mg Tabs (Montelukast Sodium) .... Take One Tablet Daily 12)  Xyzal 5 Mg Tabs (Levocetirizine Dihydrochloride) .... Take 2 Tablet Daily 13)  Nystop  100000 Unit/gm Powd (Nystatin) .... Apply To Affected Area Two Times A Day.  Disp 60 Grams 14)  Vitamin D (Ergocalciferol) 50000 Unit Caps (Ergocalciferol) .... Take One Tablet Weekly  Allergies (verified): 1)  ! Xanax  Past History:  Past Medical History: Last updated: 10/10/2008 Bipolar Disorder Valve Prolaspe Hyperlipidemia  Past Surgical History: Last updated: 02/24/2010 Hysterectomy Tonsillectomy Back Surgery Sinus Surgey x2-01/2010  Review of Systems      See HPI  Physical Exam  General:  Well-developed,well-nourished,in no acute distress; alert,appropriate and cooperative throughout examination Head:  Normocephalic and atraumatic without obvious abnormalities. No apparent alopecia or balding.  no TTP over sinuses Eyes:  No corneal or conjunctival inflammation noted. EOMI. Perrla.  Vision grossly normal. Ears:  External ear exam shows no significant lesions or deformities.  Otoscopic examination reveals clear canals, tympanic membranes are intact bilaterally without bulging, retraction, inflammation or discharge. Hearing is grossly normal bilaterally. Nose:  no congestion Mouth:  Oral mucosa and oropharynx without lesions or exudates.  Teeth in good repair. Neck:  No deformities, masses, or tenderness noted. Lungs:  Normal respiratory effort, chest expands symmetrically. Lungs are clear to auscultation, no crackles or wheezes. Heart:  Normal rate and regular rhythm. S1 and S2 normal without gallop, murmur, click, rub or other extra sounds. Abdomen:  Bowel sounds positive,abdomen soft and  non-tender without masses, organomegaly or hernias noted. Pulses:  +2 carotid, radial, DP Extremities:  no C/C/E Psych:  Cognition and judgment appear intact. Alert and cooperative with normal attention span and concentration. No apparent delusions, illusions, hallucinations   Impression & Recommendations:  Problem # 1:  FATIGUE (ICD-780.79) Assessment Unchanged pt not sure if this  is menopause or depression related- or something else entirely.  check TSH and r/o anemia w/ CBC. Orders: Venipuncture ([address]) Specimen Handling ([address]) TLB-TSH (Thyroid Stimulating Hormone) (84443-TSH) TLB-CBC Platelet - w/Differential (85025-CBCD)  Problem # 2:  HOT FLASHES (ICD-627.2) Assessment: New pt likely experiencing menopause.  check hormones as pt is s/p hysterectomy.  encouraged black cohosh- pt already on SSRI. Orders: TLB-Luteinizing Hormone (LH) (83002-LH) TLB-FSH (Follicle Stimulating Hormone) (83001-FSH)  Problem # 3:  DEPRESSIVE DISORDER (ICD-311) Assessment: Deteriorated pt struggling w/ anniversary of mom's death.  encouraged her to call psych for f/u.  pt agreeable. Her updated medication list for this problem includes:    Effexor 37.5 Mg Tabs (Venlafaxine hcl) .Marland Kitchen... Take  one tablet three times a day    Klonopin 1 Mg Tabs (Clonazepam) .Marland Kitchen... Take one tablet qid as needed  Problem # 4:  COUGH (ICD-786.2) Assessment: New not appreciated on exam today but pt w/ decreased O2 sat.  has hx of asthma- follows w/ Dr [place] Callas.  check CXR.  will follow closely. Orders: T-2 View CXR (71020TC)  Complete Medication List: 1)  Seroquel 400 Mg Tabs (Quetiapine fumarate) .... Take one tablet daily and one tablet at bedtime 2)  Lamictal 200 Mg Tabs (Lamotrigine) .... Take one tablet two times a day 3)  Effexor 37.5 Mg Tabs (Venlafaxine hcl) .... Take  one tablet three times a day 4)  Klonopin 1 Mg Tabs (Clonazepam) .... Take one tablet qid as needed 5)  Atenolol 50 Mg Tabs (Atenolol) .... Take one tablet two times a day 6)  Gemfibrozil 600 Mg Tabs (Gemfibrozil) .... Take one tablet two times a day 7)  Fish Oil 1000 Mg Caps (Omega-3 fatty acids) .... Take one tablet qid 8)  Crestor 10 Mg Tabs (Rosuvastatin calcium) .... Take one tablet at bedtime 9)  Omeprazole 40 Mg Cpdr (Omeprazole) .Marland Kitchen.. 1 tab by mouth daily 10)  Flonase 50 Mcg/act Susp (Fluticasone propionate) .... 2 sprays  each nostil daily 11)  Singulair 10 Mg Tabs (Montelukast sodium) .... Take one tablet daily 12)  Xyzal 5 Mg Tabs (Levocetirizine dihydrochloride) .... Take 2 tablet daily 13)  Nystop 100000 Unit/gm Powd (Nystatin) .... Apply to affected area two times a day.  disp 60 grams 14)  Vitamin D (ergocalciferol) 50000 Unit Caps (Ergocalciferol) .... Take one tablet weekly  Patient Instructions: 1)  Please go to [address] to get your chest xray 2)  We'll notify you of your lab and xray results 3)  Start the [facility] as directed on the package 4)  Call your psychiatrist if the depression continues 5)  Call with any questions or concerns 6)  Hang in there!

## 2010-12-09 NOTE — Letter (Signed)
Summary: Primary Care Consult Scheduled Letter  [facility]  [address] [place], [place] [address]   Phone: [phone]  Fax: [phone]      03/06/2010 MRN: [id number]  [facility] [address] [place], [place]  [address]    Dear Ms. Leinweber,    We have scheduled an appointment for you.  At the recommendation of Dr. Neena Rhymes, we have scheduled you for a Stress Test with [facility] on 03-27-2010 at 11:15am.  Their address is [address] floor, [place] [address]. The office phone number is [phone].  If this appointment day and time is not convenient for you, please feel free to call the office of the doctor you are being referred to at the number listed above and reschedule the appointment.    It is important for you to keep your scheduled appointments. We are here to make sure you are given good patient care.   Thank you,    Renee, Patient Care Coordinator [facility]

## 2010-12-11 NOTE — Progress Notes (Signed)
Summary: refill  Phone Note Refill Request Call back at Work Phone [phone] Message from:  Patient  Refills Requested: Medication #1:  ATENOLOL 50 MG TABS take one tablet two times a day [facility]............Marland KitchenFelecia Deloach CMA  October 31, 2010 8:58 AM      Prescriptions: ATENOLOL 50 MG TABS (ATENOLOL) take one tablet two times a day  #90 Tablet x 0   Entered by:   Doristine Devoid CMA   Authorized by:   Neena Rhymes MD   Signed by:   Doristine Devoid CMA on 10/31/2010   Method used:   Faxed to ...       [facility] (mail-order)             , [place]         Ph: [phone]       Fax: [phone]   RxID:   [id number]

## 2010-12-15 ENCOUNTER — Ambulatory Visit (INDEPENDENT_AMBULATORY_CARE_PROVIDER_SITE_OTHER): Payer: Medicare Other | Admitting: Family Medicine

## 2010-12-15 ENCOUNTER — Encounter: Payer: Self-pay | Admitting: Family Medicine

## 2010-12-15 DIAGNOSIS — R059 Cough, unspecified: Secondary | ICD-10-CM

## 2010-12-15 DIAGNOSIS — R05 Cough: Secondary | ICD-10-CM

## 2010-12-25 NOTE — Assessment & Plan Note (Signed)
Summary: CHEST CONGESTION/RH.....   Vital Signs:  Patient profile:   55 year old female Weight:      234 pounds BMI:     35.19 Temp:     98.4 degrees F oral BP sitting:   114 / 78  (left arm)  Vitals Entered By: Doristine Devoid CMA (December 15, 2010 2:42 PM) CC: cough and wheezing x1 week mainly dry cough    History of Present Illness: 55 yo woman here today for cough.  sxs started 1 week ago.  also reports wheezing.  no fevers.  cough is dry.  'sounds like a smokers' cough'  has been using Mucinex for chest congestion. not taking Singulair or xyzal.  using Qvar two times a day.  has been out of albuterol inhaler.  needs refill on Omeprazole.  Current Medications (verified): 1)  Seroquel 400 Mg Tabs (Quetiapine Fumarate) .... Take One Tablet Daily and One Tablet At Bedtime 2)  Lamictal 200 Mg Tabs (Lamotrigine) .... Take One Tablet Two Times A Day 3)  Effexor 37.5 Mg Tabs (Venlafaxine Hcl) .... Take  One Tablet Three Times A Day 4)  Klonopin 1 Mg Tabs (Clonazepam) .... Take One Tablet Qid As Needed 5)  Atenolol 50 Mg Tabs (Atenolol) .... Take One Tablet Two Times A Day 6)  Gemfibrozil 600 Mg Tabs (Gemfibrozil) .... Take One Tablet Two Times A Day 7)  Fish Oil 1000 Mg Caps (Omega-3 Fatty Acids) .... Take One Tablet Qid 8)  Crestor 10 Mg Tabs (Rosuvastatin Calcium) .... Take One Tablet At Bedtime 9)  Omeprazole 40 Mg Cpdr (Omeprazole) .Marland Kitchen.. 1 Tab By Mouth Daily 10)  Flonase 50 Mcg/act Susp (Fluticasone Propionate) .... 2 Sprays Each Nostil Daily 11)  Singulair 10 Mg Tabs (Montelukast Sodium) .... Take One Tablet Daily 12)  Xyzal 5 Mg Tabs (Levocetirizine Dihydrochloride) .... Take 2 Tablet Daily 13)  Nystop 100000 Unit/gm Powd (Nystatin) .... Apply To Affected Area Two Times A Day.  Disp 60 Grams 14)  Tessalon 200 Mg Caps (Benzonatate) .... Take One Capsule By Mouth Three Times A Day As Needed For Cough  Allergies (verified): 1)  ! Xanax  Past History:  Past Medical  History: Bipolar Disorder Valve Prolaspe Hyperlipidemia Asthma Allergic rhinitis  Review of Systems      See HPI  Physical Exam  General:  Well-developed,well-nourished,in no acute distress; alert,appropriate and cooperative throughout examination Head:  Normocephalic and atraumatic without obvious abnormalities. No apparent alopecia or balding.  no TTP over sinuses Eyes:  no injxn or inflammation Ears:  External ear exam shows no significant lesions or deformities.  Otoscopic examination reveals clear canals, tympanic membranes are intact bilaterally without bulging, retraction, inflammation or discharge. Hearing is grossly normal bilaterally. Nose:  marked turbinate edema and congestion Mouth:  + PND Neck:  No deformities, masses, or tenderness noted. Lungs:  Normal respiratory effort, chest expands symmetrically. Lungs are clear to auscultation, no crackles or wheezes.  + dry cough Heart:  Normal rate and regular rhythm. S1 and S2 normal without gallop, murmur, click, rub or other extra sounds.   Impression & Recommendations:  Problem # 1:  COUGH (ICD-786.2) Assessment Deteriorated  pt's cough likely multifactorial- untreated PND, untreated GERD.  restart nasal steroid spray, anti-histamine, singulair and both controller and rescue inhaler.  also provided refill on PPI.  will follow.  Orders: Prescription Created Electronically [id number])  Complete Medication List: 1)  Seroquel 400 Mg Tabs (Quetiapine fumarate) .... Take one tablet daily and one tablet at bedtime  2)  Lamictal 200 Mg Tabs (Lamotrigine) .... Take one tablet two times a day 3)  Effexor 37.5 Mg Tabs (Venlafaxine hcl) .... Take  one tablet three times a day 4)  Klonopin 1 Mg Tabs (Clonazepam) .... Take one tablet qid as needed 5)  Atenolol 50 Mg Tabs (Atenolol) .... Take one tablet two times a day 6)  Gemfibrozil 600 Mg Tabs (Gemfibrozil) .... Take one tablet two times a day 7)  Fish Oil 1000 Mg Caps (Omega-3 fatty  acids) .... Take one tablet qid 8)  Crestor 10 Mg Tabs (Rosuvastatin calcium) .... Take one tablet at bedtime 9)  Omeprazole 40 Mg Cpdr (Omeprazole) .Marland Kitchen.. 1 tab by mouth daily 10)  Flonase 50 Mcg/act Susp (Fluticasone propionate) .... 2 sprays each nostil daily 11)  Singulair 10 Mg Tabs (Montelukast sodium) .... Take one tablet daily 12)  Xyzal 5 Mg Tabs (Levocetirizine dihydrochloride) .... Take 2 tablet daily 13)  Nystop 100000 Unit/gm Powd (Nystatin) .... Apply to affected area two times a day.  disp 60 grams 14)  Tessalon 200 Mg Caps (Benzonatate) .... Take one capsule by mouth three times a day as needed for cough 15)  Proair Hfa 108 (90 Base) Mcg/act Aers (Albuterol sulfate) .... 2 puffs q4 as needed for cough/wheezing 16)  Qvar 80 Mcg/act Aers (Beclomethasone dipropionate) .... 2 puffs two times a day. 17)  Xyzal 5 Mg Tabs (Levocetirizine dihydrochloride) .... Once daily  Patient Instructions: 1)  Please schedule your complete physical in April- do not before this appt 2)  Your cough is due to your nasal allergies 3)  Restart the Xyzal daily 4)  Use the Flonase daily to decrease nasal swelling and drainage 5)  Continue the Singulair 6)  Use the Omeprazole for your acid reflux 7)  Call with any questions or concerns 8)  Hang in there! Prescriptions: SINGULAIR 10 MG TABS (MONTELUKAST SODIUM) take one tablet daily  #30 x 3   Entered and Authorized by:   Neena Rhymes MD   Signed by:   Neena Rhymes MD on 12/15/2010   Method used:   Electronically to        [facility]  [phone]* (retail)       [address] [place], [place]  [address]       Ph: [phone] or [phone]       Fax: [phone]   RxID:   [id number] FLONASE 50 MCG/ACT SUSP (FLUTICASONE PROPIONATE) 2 sprays each nostil daily  #1 x 3   Entered and Authorized by:   Neena Rhymes MD   Signed by:   Neena Rhymes MD on 12/15/2010   Method used:   [facility]ically [facility]        [facility]  [phone][facility] (retail)       [address] [place], [place]  [address]       Ph: [phone] or [phone]       Fax: [phone]   RxID:   [id number] OMEPRAZOLE 40 MG CPDR (OMEPRAZOLE) 1 tab by mouth daily  #30 x 3   Entered and Authorized by:   Neena Rhymes MD   Signed by:   Neena Rhymes MD on 12/15/2010   Method used:   Electronically to        [facility]  [phone][facility] (retail)       [address] [place], [place]  [address]  Ph: [phone] or [phone]       Fax: [phone]   RxID:   [id number] XYZAL 5 MG  TABS (LEVOCETIRIZINE DIHYDROCHLORIDE) once daily  #30 x 11   Entered and Authorized by:   Neena Rhymes MD   Signed by:   Neena Rhymes MD on 12/15/2010   Method used:   Electronically to        [facility]  [phone]* (retail)       [address] [place], [place]  [address]       Ph: [phone] or [phone]       Fax: [phone]   RxID:   [id number] QVAR 80 MCG/ACT AERS (BECLOMETHASONE DIPROPIONATE) 2 puffs two times a day.  #1 x 3   Entered and Authorized by:   Neena Rhymes MD   Signed by:   Neena Rhymes MD on 12/15/2010   Method used:   Electronically to        [facility]  [phone]* (retail)       [address] [place], [place]  [address]       Ph: [phone] or [phone]       Fax: [phone]   RxID:   [id number] PROAIR HFA 108 (90 BASE) MCG/ACT AERS (ALBUTEROL SULFATE) 2 puffs Q4 as needed for cough/wheezing  #1 x 3   Entered and Authorized by:   Neena Rhymes MD   Signed by:   Neena Rhymes MD on 12/15/2010   Method used:   Electronically to        [facility]  [phone]* (retail)       [address] [place], [place]  [address]       Ph: [phone] or [phone]       Fax: [phone]   RxID:   [id number]    Orders Added: 1)  Est. Patient Level III [83382] 2)  Prescription Created Electronically [phone]

## 2011-01-01 ENCOUNTER — Encounter: Payer: Self-pay | Admitting: Family Medicine

## 2011-01-20 NOTE — Consult Note (Signed)
Summary: [facility]  [facility]   Imported By: Maryln Gottron 01/13/2011 12:56:14  _____________________________________________________________________  External Attachment:    Type:   Image     Comment:   External Document

## 2011-01-28 LAB — BASIC METABOLIC PANEL
BUN: 14 mg/dL (ref 6–23)
Calcium: 8.9 mg/dL (ref 8.4–10.5)
Chloride: 106 mEq/L (ref 96–112)
GFR calc Af Amer: >60 mL/min (ref >60)
GFR calc non Af Amer: >60 mL/min (ref >60)
Potassium: 4.1 mEq/L (ref 3.5–5.1)

## 2011-01-28 LAB — ANAEROBIC CULTURE

## 2011-01-28 LAB — FUNGUS CULTURE W SMEAR

## 2011-01-28 LAB — CULTURE, ROUTINE-SINUS

## 2011-01-28 LAB — POCT HEMOGLOBIN-HEMACUE: Hemoglobin: 12.6 g/dL (ref 12.0–15.0)

## 2011-01-30 ENCOUNTER — Telehealth: Payer: Self-pay | Admitting: Family Medicine

## 2011-01-30 MED ORDER — ATENOLOL 50 MG PO TABS: 50.0000 mg | ORAL_TABLET | Freq: Two times a day (BID) | ORAL | Status: DC

## 2011-01-30 NOTE — Telephone Encounter (Signed)
I spoke with pt and scheduled CPX for May.

## 2011-01-30 NOTE — Telephone Encounter (Signed)
Pt is due for CPX 4/12. Will send refills.

## 2011-02-02 LAB — POCT HEMOGLOBIN-HEMACUE: Hemoglobin: 13 g/dL (ref 12.0–15.0)

## 2011-02-02 LAB — BASIC METABOLIC PANEL
BUN: 16 mg/dL (ref 6–23)
Calcium: 9.2 mg/dL (ref 8.4–10.5)
GFR calc non Af Amer: >60 mL/min (ref >60)
Glucose, Bld: 105 mg/dL — ABNORMAL HIGH (ref 70–99)
Potassium: 4.4 mEq/L (ref 3.5–5.1)
Sodium: 135 mEq/L (ref 135–145)

## 2011-02-04 MED ORDER — ATENOLOL 50 MG PO TABS: 50.0000 mg | ORAL_TABLET | Freq: Two times a day (BID) | ORAL | Status: DC

## 2011-02-04 NOTE — Telephone Encounter (Signed)
Addended byCandie Echevaria on: 02/04/2011 09:37 AM   Modules accepted: Orders

## 2011-02-04 NOTE — Telephone Encounter (Signed)
Pt called back stating that RX not at pharmacy.[facility]. Pt aware Rx sent to pharmacy.Felecia Khalie Wince CMA

## 2011-03-05 ENCOUNTER — Ambulatory Visit (INDEPENDENT_AMBULATORY_CARE_PROVIDER_SITE_OTHER): Payer: Medicare Other | Admitting: Family Medicine

## 2011-03-05 ENCOUNTER — Encounter: Payer: Self-pay | Admitting: Family Medicine

## 2011-03-05 DIAGNOSIS — J40 Bronchitis, not specified as acute or chronic: Secondary | ICD-10-CM

## 2011-03-05 DIAGNOSIS — R062 Wheezing: Secondary | ICD-10-CM

## 2011-03-05 DIAGNOSIS — J309 Allergic rhinitis, unspecified: Secondary | ICD-10-CM

## 2011-03-05 MED ORDER — AMOXICILLIN 500 MG PO CAPS: 500.0000 mg | ORAL_CAPSULE | Freq: Two times a day (BID) | ORAL | Status: AC

## 2011-03-05 MED ORDER — ALBUTEROL SULFATE HFA 108 (90 BASE) MCG/ACT IN AERS: 2.0000 | INHALATION_SPRAY | RESPIRATORY_TRACT | Status: DC | PRN

## 2011-03-05 NOTE — Progress Notes (Signed)
  Subjective:    Patient ID: [name], female    DOB: 12/10/1955, 55 y.o.   MRN: [id number]  HPI ? Bronchitis- having increased wheezing, coughing.  sxs started 1 month ago.  No fevers.  + nasal congestion, denies PND.  Some sinus pain.  + fatigue.  Not using inhalers b/c Dr [place] Callas took her off the meds and put her on steroids which made her manic.   Review of Systems For ROS see HPI     Objective:   Physical Exam  Constitutional: She appears well-developed and well-nourished. No distress.  HENT:  Head: Normocephalic and atraumatic.       TMs normal bilaterally + turbinate edema + PND  Eyes: Conjunctivae and EOM are normal. Pupils are equal, round, and reactive to light.  Neck: Normal range of motion. Neck supple.  Cardiovascular: Normal rate, regular rhythm, normal heart sounds and intact distal pulses.   No murmur heard. Pulmonary/Chest: Effort normal. No respiratory distress. She has wheezes.       + hacking cough  Lymphadenopathy:    She has no cervical adenopathy.          Assessment & Plan:

## 2011-03-05 NOTE — Patient Instructions (Signed)
This is most likely allergy/asthma related Take the Amoxicillin for a possible bronchitis- take w/ food to avoid upset stomach Use the albuterol inhaler as needed for cough and wheezing Use the Qvar daily- 1 puff twice a day- as your controller medicine Continue the Singulair at night and the Claritin/Zyrtec/Allegra daily for allergies Use the Tessalon as needed for cough Drink plenty of fluids Hang in there!

## 2011-03-10 NOTE — Assessment & Plan Note (Signed)
Restart daily nasal spray, continue antihistamines, singulair.

## 2011-03-10 NOTE — Assessment & Plan Note (Signed)
Given duration of sxs will start abx to treat bronchitis.  Restart controller inhaler and albuterol prn.  Reviewed supportive care and red flags that should prompt return.  Pt expressed understanding and is in agreement w/ plan.

## 2011-03-12 ENCOUNTER — Other Ambulatory Visit: Payer: Self-pay | Admitting: *Deleted

## 2011-03-12 NOTE — Telephone Encounter (Signed)
Pt was seen on 4/26. Please advise.

## 2011-03-12 NOTE — Telephone Encounter (Signed)
Ok to refill, #60 

## 2011-03-13 MED ORDER — BENZONATATE 200 MG PO CAPS: 200.0000 mg | ORAL_CAPSULE | Freq: Three times a day (TID) | ORAL | Status: DC | PRN

## 2011-03-13 NOTE — Telephone Encounter (Signed)
Pt aware.

## 2011-03-25 ENCOUNTER — Other Ambulatory Visit: Payer: Self-pay | Admitting: Family Medicine

## 2011-03-25 MED ORDER — ALBUTEROL SULFATE HFA 108 (90 BASE) MCG/ACT IN AERS: 2.0000 | INHALATION_SPRAY | RESPIRATORY_TRACT | Status: DC | PRN

## 2011-03-27 NOTE — Discharge Summary (Signed)
[facility]  Patient:    [name], [name]                        MRN: [id number] Adm. Date:  [id number] Disc. Date: [id number] Attending:  Geoffery Lyons A Dictator:   Candi Leash. Orsini, N.P.                           Discharge Summary  IDENTIFYING INFORMATION:  This is a 55 year old divorced white female who was voluntarily admitted for confusion and decreasing ADLs.  The patient presents with a history of bipolar disorder.  The patient was found in her home on the floor by her mother, apparently in a catatonic state.  The patient has no recollection of what had happened prior to this event.  She does report some sort of a fall and presented with slurred speech.  The patient does state that she has been taking up to 6 mg or more of Xanax for anxiety, but cannot remember if she took more or when she took them.  The patient reports that she has been having some slurred speech for approximately three weeks.  She denies any overt depression, denies taking the extra Xanax as a suicide attempt.  She reports she has been having increasing anxiety over the past several weeks. The patient does deny any suicidal or homicidal ideation.  She has been experiencing positive auditory hallucinations, with voices telling her that "there is something wrong with you."  She had positive visual hallucinations yesterday with the patient seeing spiders and "sunspots."  The patient is experiencing positive paranoia.  She feels that cameras in her room are watching her. The patient feels very afraid.  She also reports she has been sleeping well. Appetite has been good.  PAST MEDICAL HISTORY:  The patient has a history of palpitations, has been put on a beta blocker.  She had a recent hysterectomy in February of this year. Her primary care Duana Benedict is Dr. Duaine Dredge on [facility] in [place].  CURRENT MEDICATIONS: 1. Seroquel 25 mg two t.i.d. 2. Seroquel 100 mg two q.h.s. 3.  Atenolol 50 mg one q.a.m. 4. Gabitril tabs t.i.d. 5. Effexor XR 150 mg one q.d. 6. Gemfibrozil 600 mg b.i.d. 7. Xanax one tab t.i.d. as needed.  DRUG ALLERGIES:  The patient states she is allergic to [employer] [employer].  PHYSICAL EXAMINATION:  Performed at [facility].  The patient does present with some abrasions on her left knee and a reddened area on her right knee.  LABORATORY DATA:  CT scan was negative.  Neutrophils were 80.  Urine drug screen was positive for benzodiazepines, positive for phencyclidines.  MENTAL STATUS EXAMINATION:  She is an alert, middle-aged Caucasian female, mildly overweight, dressed in hospital wear.  She is cooperative and restless. The patient did vomit once during the interview.  She has good eye contact. Her speech is normal with a lot of thought blocking.  The patient is trying to come up with the words, but is unable to do so.  Mood is anxious and fearful. Affect is anxious and somewhat suspicious.  Thought processes:  The patient is experiencing positive auditory and visual hallucinations, positive paranoia, some flight of ideas, no delusions.  Cognitively, short term memory is poor. The patient knows who she is but is uncertain of the date and time.  She states she is in a clinic across from [employer].  She appears to  have average or above average intelligence.  Concentration is decreased.  Judgment is fair, insight is fair.  DIAGNOSES: Axis I:    1. Bipolar disorder.            2. Anxiety disorder.            3. Xanax abuse. Axis II:   Deferred. Axis III:  1. Heart palpitations.            2. Temporal mandibular joint problems. Axis IV:   Mild to moderate psychosocial stressors. Axis V:    Current is 30, this past year is 60.  HOSPITAL COURSE:  The patient was admitted on a voluntary basis for confusion and deteriorating anxiety and psychosis.  The patient was monitored every 15 minutes.  The patient is encouraged to ask for assistance.  A  phenobarb protocol will be initiated for her Xanax detox.  The patient is also considered to be a high fall risk.  We will contact the mother for background information and Dr. Betti Cruz for consideration of mood stabilizers.  We will resume her routine medications.  Our goal is to improve her thought process and improve her stabilization and thinking so the patient can return to prior living arrangements, and to decrease her endangering symptoms so the patient can live within safe limits.  Initially the patient was very anxious, having pressured speech and palpitations.  The clients mother was contacted and her mother stated that the patient was doing fairly well until the patient had her hysterectomy in February.  The patient was becoming increasingly agitated and confused, having difficulty following directions.  The patient seemed to be aware that she was having some problems.  The patient required a now dose of Ativan to calm the patient down.  The patient was sleeping kind of on and off, having some sort of twitching type episodes.  The patient required a lot of reassurance and support.  The patient was beginning to settle down some, although she was still somewhat labile and ruminated about getting better, what was wrong with her.  The patient was feeling very unsure about herself and continued to require a lot of reassurance.  She was continued to be detoxed with her phenobarbital.  We decreased her Gabitril and increased her Seroquel.  The patient did have a rough night.  It seemed to be related to her Seroquel.  She seemed to be more confused and agitated.  We discontinued her phenobarbital and discontinued her Gabitril and discussed the use of Depakote as a mood stabilizer.  The patient was started on Depakote 125 mg and appears to be tolerating it well without any side effects.  Her mental status remained the same.  There were no episodes of confusion.  She was tolerating  her medication.  The patient continued to improve.  There was no confusion.  She appeared to be back to her baseline.  The patient continued to have some  anxiety and some worry.  Her Depakote was increased and there was some consideration for the patient to attend the IOP program and thoughts about her seeing a therapist.  CONDITION UPON DISCHARGE:  The patient was much improved.  She was sleeping better on the Seroquel.  Her anxiety was in a more acceptable range.  The patient was not expressing any suicidal ideation.  It was felt that the patient could be managed on an outpatient basis.  The patient was to be discharged to the IOP program.  The patient was to  follow up with [facility] in an intensive outpatient program.  The patient was provided phone numbers and an appointment was made.  DISCHARGE MEDICATIONS: 1. Depakote 250 mg one b.i.d. 2. Seroquel 200 mg q.h.s. 3. Effexor XR 150 mg one q.d.  DISCHARGE DIAGNOSES: Axis I:    1. Bipolar disorder.            2. Anxiety disorder.            3. Xanax abuse. Axis II:   Deferred. Axis III:  1. Heart palpitations.            2. Temporomandibular joint problems. Axis IV:   Mild to moderate psychosocial stressors. Axis V:    Current is 60, this past year 60. DD:  04/05/01 TD:  04/05/01 Job: [id number]

## 2011-03-27 NOTE — Discharge Summary (Signed)
[facility]  Patient:    [name], [name]                     MRN: [id number] Adm. Date:  [id number] Disc. Date: [id number] Attending:  Lars Pinks                           Discharge Summary  FINAL DIAGNOSIS:              Myoma uteri.  SECONDARY DIAGNOSIS:          Bipolar psychiatric disorder.  PROCEDURE:                    Total abdominal hysterectomy.  COMPLICATIONS:                None.  CONDITION ON DISCHARGE:       Improved.  HISTORY OF PRESENT ILLNESS:   This is a 55 year old nulligravida female who was noted to have enlarged uterine myoma and also complained of severe menorrhagia which did not respond to oral contraceptives.  Because of the size and the symptoms associated with the myoma, the decision was made to proceed with hysterectomy.  HOSPITAL COURSE:              The patient was taken to the operating room on the day of admission where a total abdominal hysterectomy was performed without complications.  The patients postoperative course was benign.  Her hemoglobin, which was 11 on admission, dropped to 9 postoperatively; otherwise the patient remained perfectly stable.  On the second postoperative day the patient was afebrile, voiding without difficulty, passing gas, and her pain was controlled well with oral analgesics.  For that reason, she was felt to be ready for discharge.  DIET:                         She was discharged on a regular diet.  ACTIVITY:                     She was told to limit her activity.  DISCHARGE MEDICATIONS:        She was given Tylox 30 tablets to take one to two every four hours for pain. She was asked to supplement this with over-the-counter pain medication.  She was told to take iron one to two tablets a day with meals and to resume all her antipsychotic medications which she was taking prior to admission.  FOLLOW-UP:                    She was also asked to return to the office  in four weeks for follow-up evaluation. DD:  12/16/00 TD:  12/17/00 Job: [id number]

## 2011-03-27 NOTE — Assessment & Plan Note (Signed)
[facility]                         GASTROENTEROLOGY OFFICE NOTE   [name], [name]                        MRN:          [id number]  DATE:11/11/2006                            DOB:          [birth year]    REFERRING PHYSICIAN:  Mosetta Putt, M.D.   REASON FOR CONSULTATION:  Change in bowel habits with worsening  constipation.   ASSESSMENT:  A 55 year old, white woman with chronic constipation which  has gotten worse.  This may be related to medication.  Change in bowel  habits could be the tumor or diverticulosis.  She is currently better on  Amitiza 24 mcg twice daily.  She is using some of her mother's supply.  In the past, PEG-based regimens, stimulant laxatives fiber have either  not worked or made her worse.  She has taken up to 4 doses of MiraLax a  day.  It does sound like she probably has some sort of slow transient  constipation.  In fact, her mother has the same sort of problem.   RECOMMENDATIONS AND PLAN:  1. Continue Amitiza.  Samples and prescription given.  2. Go ahead and add MiraLax 2 doses a day to that.  This may help      relieve some of the straining she is having even though she is      moving her bowels every 3 days or so on Amitiza which is a big      improvement.  3. Schedule colonoscopy to investigate for any type of mass lesion or      other change that could be linked to a change in her bowels.  I      know she has chronic constipation but it has worsened, probably      medication-related but we should be sure and she is close enough to      50 anyway.  Risks, benefits, and indications are explained.      MiraLax prep will be used.   HISTORY:  A 55 year old black woman with problems as above.  She has  been constipated her whole life but she has developed worsening problems  and Dr. Duaine Dredge has treated her with aggressive doses of MiraLax and  even Colyte up to several liters at a time.  He has suspected that  this  may be somewhat in part due to her psychotropic medications and she  concurs.  She has really been constipated her whole life.  She has tried  the medications as described above without significant benefit.  Recently, she started using some of her mother's Amitiza and noted that  helps.   MEDICATIONS:  1. Lamictal 200 mg b.i.d.  2. Klonopin 2 mg p.r.n.  3. Effexor 150 mg daily.  4. Seroquel XR 400 mg nightly.  5. Welchol 625 mg 6 tablets daily.  6. Gemfibrozil 600 mg b.i.d.  7. Atenolol 50 mg daily.  8. Minocycline 100 mg b.i.d.   DRUG ALLERGIES:  XANAX.   PAST MEDICAL HISTORY:  1. Depression/bipolar disorder.  2. Some panic and anxiety problems.  3. Dyslipidemia.  4. Chronic headaches.  5. Prior  hysterectomy.  6. Mitral prolapse.  7. Ruptured disk and back surgery.   FAMILY HISTORY:  As above.  Mother has chronic constipation.  No colon  cancer.   SOCIAL HISTORY:  She is divorced.  She has worked at [employer].  Rare alcohol.  No tobacco or drugs.  No children.   REVIEW OF SYSTEMS:  Eyeglasses are noted.  When her constipation  worsens, she will have some left back pain.  All other systems including  GI review of systems except for rare heartburn are negative.   PHYSICAL EXAM:  Obese, pleasant, white woman in no acute distress.  Height 5 feet 8-1/2 inches.  Weight 217 pounds.  Blood pressure 92/64.  Pulse 70.  EYES:  Anicteric.  ENT:  Normal mouth, nose, pharynx.  NECK:  Supple.  No thyromegaly, mass.  CHEST:  Clear.  HEART:  S1, S2.  No gallops.  ABDOMEN:  Obese, soft, nontender.  No organomegaly or mass.  RECTAL:  Deferred.  LYMPHATIC:  __________.  EXTREMITIES:  No edema.  SKIN:  No rash.  NEURO:  She is alert and oriented x3.   LAB DATA:  White count 3.3, glucose 105, CO2 19, otherwise normal CMET.  Total cholesterol 228, triglycerides 278, VLDL 56, LDL 132.  TSH is  normal at 1.964.  Her hemoglobin was 13.4.   I appreciate the opportunity to care for this  patient.     Iva Boop, MD,FACG  Electronically Signed    CEG/MedQ  DD: 11/11/2006  DT: 11/11/2006  Job #: [id number]   cc:   Mosetta Putt, M.D.

## 2011-03-27 NOTE — H&P (Signed)
[facility]  PatientDELRAE, [name]                           MRN: [id number] Adm. Date:  12/14/00 Attending:  Caralyn Guile. Arlyce Dice, M.D.                         History and Physical  CHIEF COMPLAINT:              Menorrhagia and leiomyomata uteri.  HISTORY OF PRESENT ILLNESS:   This is a 55 year old, nulligravida female who was seen in the office in November with complaints of heavy vaginal bleeding for approximately one year.  The patient states that she has had three days of heavy bleeding that requires her to stay in the house.  At times she floods through pads and tampons and has to change her clothing.  She has tried birth control pills for this problem which has failed to alleviate the heavy bleeding.  She has had an ultrasound in the past which shows she has uterine leiomyomata.  PAST MEDICAL HISTORY:         She has manic bipolar disorder for which she is being treated by Dr. Betti Cruz in [place].  She has no other medical problems. She has had back surgery, no other operations.  Her family doctor is Dr. Duaine Dredge.  She has never had a blood transfusion.  ALLERGIES:                    No known allergies.  PRESENT MEDICATIONS:          Klonopin and gabitril, both of which are being used for her bipolar disorder.  FAMILY HISTORY:               Reveals that her father had heart disease.  No diabetes or hypertension in the family.  SOCIAL HISTORY:               She is a nonsmoker, and she has not experimented with nonprescription medications.  REVIEW OF SYSTEMS:            Negative in detail.  PHYSICAL EXAMINATION:  VITAL SIGNS:                  She is 5 feet 8-1/2 inches, 169 pounds.  Blood pressure 128/78.  HEENT:                        Normal.  NECK:                         Supple without thyromegaly.  BREASTS:                      Without masses.  HEART:                        Regular sinus rhythm without murmurs or  gallops.  CHEST:                        Clear to auscultation and percussion.  ABDOMEN:                      Revealed a 12 to 14-week pelvic mass consistent with myoma.  PELVIC:  External genitalia normal.  Vagina was normal. Her cervix showed a 1 cm polyp.  Her uterus was irregular with a pedunculated myoma approximately 12-week size.  Her adnexa were without masses.  ADMISSION DIAGNOSES:          Menorrhagia with leiomyomata uteri.  HOSPITAL PLAN:                Total abdominal hysterectomy and removal of the ovaries only if pathology of the ovaries is identified at the time of the laparotomy. DD:  12/13/00 TD:  12/13/00 Job: [id number]

## 2011-03-27 NOTE — Op Note (Signed)
[facility]  Patient:    [name], [name]                     MRN: [id number] Proc. Date: 12/14/00 Adm. Date:  [id number] Attending:  Lars Pinks                           Operative Report  PREOPERATIVE DIAGNOSIS:       1. Myoma uteri.                               2. Menorrhagia.  POSTOPERATIVE DIAGNOSIS:      1. Myoma uteri                               2. Menorrhagia  OPERATION:                    Total abdominal hysterectomy.  SURGEON:                      Richard D. Arlyce Dice, M.D.  ASSISTANT:                    Marcelle Overlie, M.D.  ANESTHESIA:                   General endotracheal anesthesia .  ESTIMATED BLOOD LOSS:         300 cc.  FINDINGS:                     Multiple myoma, normal adnexa.  The kidneys were palpable bilaterally.  The liver edge was smooth.  The gallbladder was nonpalpable.  There were no adhesions in the pelvis.  INDICATIONS:                  This is a 55 year old nulligravida female who has a history of markedly heavy periods which have not responded to oral contraceptives.  In addition, the patient has known uterine myoma confirmed by ultrasound.  Decision was made to proceed with hysterectomy.  DESCRIPTION OF PROCEDURE:     The patient was taken to the operating room, and and placed in supine position.  General endotracheal anesthesia was induced. The abdomen was then prepped and draped in sterile fashion.  The bladder was catheterized.  Low transverse incision was made, after local anesthesia was infused into the area, and carried down to the fascia.  The fascia was divided transversely.  The rectus muscle was separated from the overlying rectus sheath and then divided in the midline.  The peritoneum was entered sharply and extended vertically.  The self-retaining retractor was placed.  The bowel was packed off.  The uterus and myoma were delivered through the incision. The round ligaments were  identified, clamped, ligated, and incised, and the bladder plane was created, and the bladder was displaced inferiorly.  The utero-ovarian anastomoses were isolated bilaterally, clamped, cut, and doubly ligated.  The uterine vessels were then clamped, cut, and ligated.  The fundus and myoma were then separated from the cervix.  The cervical stump was grasped with Lahey clamp.  The bladder was further displaced inferiorly.  The cardinal ligaments were clamped, cut, and ligated.  The vagina was entered anteriorly and, with Satinsky scissors was then incised completely so that the cervix  and cervical stump were removed.  The vaginal cuff was then closed with figure-of-eight sutures at the angles and figure-of-eight sutures throughout. Hemostasis was noted to be present, and the lap packs were removed.  The retractor was removed.  The peritoneum was closed with running 0 Vicryl suture.  The fascia was closed with running 0 Vicryl suture.  The skin was closed with staples.  The patient tolerated the procedure well and left the operating room in good condition. DD:  12/14/00 TD:  12/15/00 Job: [id number]

## 2011-03-27 NOTE — H&P (Signed)
[facility]  Patient:    [name], [name]                     MRN: [id number] Adm. Date:  [id number] Disc. Date: [id number] Attending:  Kelvin Cellar Dictator:   Candi Leash. Orsini, N.P.                   Psychiatric Admission Assessment  IDENTIFYING INFORMATION:  This is a 55 year old divorced white female, voluntarily admitted to [facility] on Mar 28, 2001 for confusion and decrease in ADLs.  HISTORY OF PRESENT ILLNESS:  Patient presents with a history of bipolar disorder.  Patient was found in her home on the floor by her mother apparently in a catatonic state.  Patient has no recollection of what had happened to her or what her last memory was.  She recalled some type of fall and presented with slurred speech.  Patient does state that she has been taking up to 6 mg or more of Xanax for anxiety but she cannot remember if she took more or when she took them.  Patient reports she has been having slurred speech for about three weeks when her Xanax dosage was increased.  Patient denies any overt depression, denies taking the extra Xanax as a suicide attempt.  She reports she has been having increased anxiety over the past several weeks.  She denies any suicidal or homicidal ideation.  Patient has been experiencing positive auditory hallucinations with voices stating that "there is something wrong with you."  Positive visual hallucinations yesterday and this morning seeing spiders but now seeing "sun spots."  Patient has been experiencing positive paranoia.  She feels that there are cameras in her room watching her.  She is feeling very afraid.  Patient reports that she has been sleeping well.  Her appetite has been good.  Patient repeatedly states that "this all happened so fast, I dont know what happened."  Patient, during the interview, did vomit once after she took her medications.  PAST PSYCHIATRIC HISTORY:  Patient has a history of bipolar  diagnosed back in 1993.  She has been seeing Dr. Betti Cruz as an outpatient for the past 10 years. She states this is her first admission for any psychiatric problem.  She also sees a Warden/ranger.  I do not have that name available at this time.  SOCIAL HISTORY:  She is a 55 year old divorced white female.  Been divorced for 11 years.  She lives alone.  She does not work.  She is on disability for her psychiatric illness.  She has completed the 13th grade.  She has no legal problems.  FAMILY HISTORY:  She has a grandfather who is bipolar.  ALCOHOL/DRUG HISTORY:  She is a nonsmoker.  Nondrinker.  Denies any substance abuse.  PRIMARY CARE [name]:  Dr. Marden Noble, who is on [facility] in [place].  MEDICAL PROBLEMS:  Patient has history of palpitations, has been put on a beta-blocker.  She also had a hysterectomy in February of this year.  MEDICATIONS:  Seroquel 25 mg 2 t.i.d., Seroquel 100 mg 2 q.h.s., atenolol 50 mg 1 q.a.m., Gabitril tabs t.i.d., Effexor XR 150 mg 1 q.d., gemfibrozil 600 mg b.i.d., Xanax 1 tab t.i.d. p.r.n.  Patient does state she has been taking up to 6 mg for about a week but, again, she is not sure exactly how she has been dosing herself.  DRUG ALLERGIES:  Patient says she is allergic to [facility].  PHYSICAL  EXAMINATION:  Performed at [facility].  Patient does have some abrasions to her left knee and she has a reddened area to her right knee.  LABORATORY DATA:  There was some concerns that patient had some organic problem.  Her CT scan was negative.  Her neutrophils were 80.  Her urine drug screen was positive for benzodiazepines, positive for phencyclidine.  MENTAL STATUS EXAMINATION:  She is an alert, middle-aged, Caucasian female. She is mildly overweight.  She is dressed in hospital-wear.  She is cooperative.  She is restless.  Patient did vomit once during the interview. She has good eye contact.  Speech:  Her tone is normal.  There is a lot  of thought-blocking.  Patient is trying to come up with the words but is unable to do so.  Mood is anxious and fearful.  Her affect is anxious, somewhat suspicious.  Thought process:  Patient experiencing positive auditory and visual hallucinations.  Positive paranoia.  Some flight of ideas.  No delusions.  Cognitive:  Her short-term memory is poor.  Patient knows who she is but she is uncertain of the date and the time of day.  She states she is in the clinic across from [facility].  She appears to have average or above average intelligence.  Her concentration decreased.  Her judgment fair.  Her insight fair.  DIAGNOSES: Axis I:    1. Bipolar disorder.            2. Anxiety disorder.            3. Xanax abuse. Axis II:   Deferred. Axis III:  1. Heart palpitations.            2. Temporomandibular joint. Axis IV:   Mild to moderate psychosocial stressors. Axis V:    Current 30; this past year 79.  PLAN:  Voluntary admit to [facility] for confusion, deteriorating ADLs, psychosis.  Contract for safety.  Check every 15 minutes. Patient will ask for assistance and she will be monitored.  A phenobarbital protocol for Xanax detox.  Patient is considered to be a high fall risk.  Will contact the mother and Dr. Betti Cruz for consideration of mood stabilizers that had been tried in the past.  Will resume her routine medications.  Our goal is to improve her thought processes, improve stabilization in her thinking to return patient to her prior living arrangement and to decrease her endangering symptoms so patient can live within safe limits.  TENTATIVE LENGTH OF STAY:  Four to five days. DD:  03/29/01 TD:  03/29/01 Job: [id number]

## 2011-04-01 ENCOUNTER — Encounter: Payer: Medicare Other | Admitting: Family Medicine

## 2011-04-08 ENCOUNTER — Ambulatory Visit (INDEPENDENT_AMBULATORY_CARE_PROVIDER_SITE_OTHER): Payer: Medicare Other | Admitting: Internal Medicine

## 2011-04-08 ENCOUNTER — Encounter: Payer: Self-pay | Admitting: Internal Medicine

## 2011-04-08 DIAGNOSIS — N951 Menopausal and female climacteric states: Secondary | ICD-10-CM

## 2011-04-08 DIAGNOSIS — R5383 Other fatigue: Secondary | ICD-10-CM

## 2011-04-08 DIAGNOSIS — R5381 Other malaise: Secondary | ICD-10-CM

## 2011-04-08 DIAGNOSIS — E785 Hyperlipidemia, unspecified: Secondary | ICD-10-CM

## 2011-04-08 MED ORDER — CLONIDINE HCL 0.1 MG PO TABS: 0.1000 mg | ORAL_TABLET | Freq: Two times a day (BID) | ORAL | Status: AC

## 2011-04-08 NOTE — Progress Notes (Signed)
  Subjective:    Patient ID: [name], female    DOB: 02-13-1956, 55 y.o.   MRN: [id number]  HPI Her major concern is  hot flashes for > 12 months  which did  not respond to black cohosh & are getting worse. She also feels the nausea is a menopausal symptom.  She had been on gemfibrozil and lovastatin in the past but stopped these.She wanted to see if  Red Yeast Rice would be beneficial in treating  her cholesterol.   Her father had a massive myocardial infarction at age 58      Review of Systems she denies abdominal pain, vomiting, melena, rectal bleeding, diarrhea, or constipation.       Objective:   Physical Exam General appearance is one of good health and nourishment w/o distress.  Eyes: No conjunctival inflammation or scleral icterus is present.  Oral exam: Dental hygiene is good; lips and gums are healthy appearing.There is no oropharyngeal erythema or exudate noted.   Heart:  Normal rate and regular rhythm. S1 and S2 normal without gallop, murmur, click, rub or other extra sounds  . S4   Lungs:Chest clear to auscultation; no wheezes, rhonchi,rales ,or rubs present.No increased work of breathing.   Abdomen: bowel sounds normal, soft and non-tender without masses, organomegaly or hernias noted.  No guarding or rebound   Skin:Warm & dry.  Intact without suspicious lesions or rashes ; no jaundice or tenting  Lymphatic: No lymphadenopathy is noted about the head, neck, axilla, or inguinal areas.        Assessment & Plan:  #1 hot flashes  #2 nausea  #3 postmenopausal state  #4 dyslipidemia in the context of family history of premature coronary disease/heart attack. She is presently off statins and on  a supplement  There that it be increased risk of cardiovascular event with hormone replacement if her lipids are not at goal.  Plan: #1 low-dose clonidine will be implemented to  treat the hot flashes.  #2 advanced cholesterol testing would be recommended to  optimally assess her cardiovascular risk & need for  statin.

## 2011-04-08 NOTE — Patient Instructions (Addendum)
Your last labs were in November 2011.An  Advanced  cholesterol test  [facility] Heart panel) will be performed to optimally assess your risk and need for the statin drug. Codes: 272.4, V17.3

## 2011-04-09 ENCOUNTER — Other Ambulatory Visit (INDEPENDENT_AMBULATORY_CARE_PROVIDER_SITE_OTHER): Payer: Medicare Other

## 2011-04-09 DIAGNOSIS — R5383 Other fatigue: Secondary | ICD-10-CM

## 2011-04-09 DIAGNOSIS — R5381 Other malaise: Secondary | ICD-10-CM

## 2011-04-09 DIAGNOSIS — E785 Hyperlipidemia, unspecified: Secondary | ICD-10-CM

## 2011-04-09 DIAGNOSIS — N951 Menopausal and female climacteric states: Secondary | ICD-10-CM

## 2011-04-14 NOTE — Progress Notes (Signed)
Labs only

## 2011-04-28 ENCOUNTER — Ambulatory Visit: Payer: Medicare Other | Admitting: Family Medicine

## 2011-04-30 ENCOUNTER — Ambulatory Visit: Payer: Medicare Other | Admitting: Family Medicine

## 2011-05-28 ENCOUNTER — Other Ambulatory Visit: Payer: Self-pay | Admitting: Family Medicine

## 2011-05-28 MED ORDER — ATENOLOL 50 MG PO TABS: 50.0000 mg | ORAL_TABLET | Freq: Every day | ORAL | Status: DC

## 2011-05-28 NOTE — Telephone Encounter (Signed)
Pt is overdue for CPX, refill sent.

## 2011-06-04 ENCOUNTER — Telehealth: Payer: Self-pay | Admitting: Family Medicine

## 2011-06-04 NOTE — Telephone Encounter (Signed)
Pt called says she has BV again and would like to have something called into pharmacy again has appt pending for cpx 07/03/11.

## 2011-06-05 MED ORDER — METRONIDAZOLE 500 MG PO TABS: 500.0000 mg | ORAL_TABLET | Freq: Two times a day (BID) | ORAL | Status: AC

## 2011-06-05 NOTE — Telephone Encounter (Signed)
Rx sent, left message notifying pt.

## 2011-06-05 NOTE — Telephone Encounter (Signed)
Ok for Flagyl 500mg  1 tab po bid x7 days, #14, no refills

## 2011-06-10 ENCOUNTER — Encounter: Payer: Self-pay | Admitting: Family Medicine

## 2011-07-03 ENCOUNTER — Ambulatory Visit (INDEPENDENT_AMBULATORY_CARE_PROVIDER_SITE_OTHER): Payer: Medicare Other | Admitting: Family Medicine

## 2011-07-03 ENCOUNTER — Encounter: Payer: Self-pay | Admitting: *Deleted

## 2011-07-03 ENCOUNTER — Encounter: Payer: Self-pay | Admitting: Family Medicine

## 2011-07-03 ENCOUNTER — Other Ambulatory Visit (HOSPITAL_COMMUNITY)
Admission: RE | Admit: 2011-07-03 | Discharge: 2011-07-03 | Disposition: A | Discharge status: Home / Self Care | Payer: Medicare Other | Source: Ambulatory Visit | Attending: Family Medicine | Admitting: Family Medicine

## 2011-07-03 DIAGNOSIS — N760 Acute vaginitis: Secondary | ICD-10-CM | POA: Insufficient documentation

## 2011-07-03 DIAGNOSIS — Z0000 Encounter for general adult medical examination without abnormal findings: Secondary | ICD-10-CM | POA: Insufficient documentation

## 2011-07-03 DIAGNOSIS — Z01419 Encounter for gynecological examination (general) (routine) without abnormal findings: Secondary | ICD-10-CM

## 2011-07-03 DIAGNOSIS — E559 Vitamin D deficiency, unspecified: Secondary | ICD-10-CM

## 2011-07-03 DIAGNOSIS — Z124 Encounter for screening for malignant neoplasm of cervix: Secondary | ICD-10-CM

## 2011-07-03 DIAGNOSIS — E785 Hyperlipidemia, unspecified: Secondary | ICD-10-CM

## 2011-07-03 LAB — HEPATIC FUNCTION PANEL
ALT: 28 U/L (ref 0–35)
AST: 20 U/L (ref 0–37)
Alkaline Phosphatase: 43 U/L (ref 39–117)
Bilirubin, Direct: 0 mg/dL (ref 0.0–0.3)
Total Bilirubin: 0.9 mg/dL (ref 0.3–1.2)

## 2011-07-03 LAB — BASIC METABOLIC PANEL
CO2: 28 mEq/L (ref 19–32)
Chloride: 101 mEq/L (ref 96–112)
Potassium: 4.3 mEq/L (ref 3.5–5.1)
Sodium: 138 mEq/L (ref 135–145)

## 2011-07-03 LAB — CBC WITH DIFFERENTIAL/PLATELET
Basophils Absolute: 0 10*3/uL (ref 0.0–0.1)
Eosinophils Absolute: 0.1 10*3/uL (ref 0.0–0.7)
Lymphocytes Relative: 29.5 % (ref 12.0–46.0)
MCHC: 34.1 g/dL (ref 30.0–36.0)
Monocytes Relative: 7.6 % (ref 3.0–12.0)
Neutrophils Relative %: 61.2 % (ref 43.0–77.0)
RDW: 13.7 % (ref 11.5–14.6)

## 2011-07-03 MED ORDER — ATORVASTATIN CALCIUM 40 MG PO TABS: 40.0000 mg | ORAL_TABLET | Freq: Every day | ORAL | Status: DC

## 2011-07-03 NOTE — Patient Instructions (Signed)
Follow up in 3 months to recheck cholesterol- do not eat before this appt Try and get regular exercise and make healthy food choices Start the Lipitor daily We'll notify you of your lab results Call with any questions or concerns Happy Labor Day!

## 2011-07-03 NOTE — Progress Notes (Signed)
  Subjective:    Patient ID: [name], female    DOB: 1955/12/17, 55 y.o.   MRN: [id number]  HPI Here today for CPE.  Risk Factors: Hyperlipidemia- chronic problem for pt, not currently on meds.  Dr Alwyn Ren ordered [facility] panel in June.  Has strong family hx of CAD.  Not exercising or following healthy diet. Physical Activity: limited due to knee arthritis- seeing ortho for this.  Walking dogs Fall Risk: low risk Depression: ongoing problem for pt- is bipolar.  Regularly sees psych for this Hearing: normal to whispered voice at 6 ft ADL's: independent Cognitive: normal linear thought process, memory and attention intact Home Safety: safe at home Height, Weight, BMI, Visual Acuity: see vitals, vision corrected to 20/20 w/ glasses Counseling: UTD on mammo, colonosocopy Labs Ordered: See A&P Care Plan: See A&P    Review of Systems Patient reports no vision/ hearing changes, adenopathy,fever, weight change,  persistant/recurrent hoarseness , swallowing issues, chest pain, palpitations, edema, persistant/recurrent cough, hemoptysis, dyspnea (rest/exertional/paroxysmal nocturnal), gastrointestinal bleeding (melena, rectal bleeding), abdominal pain, significant heartburn, bowel changes, GU symptoms (dysuria, hematuria, incontinence), Gyn symptoms (abnormal  bleeding, pain),  syncope, focal weakness, memory loss, numbness & tingling, skin/hair/nail changes, abnormal bruising or bleeding.    Objective:   Physical Exam  General Appearance:    Alert, cooperative, no distress, appears stated age  Head:    Normocephalic, without obvious abnormality, atraumatic  Eyes:    PERRL, conjunctiva/corneas clear, EOM's intact, fundi    benign, both eyes  Ears:    Normal TM's and external ear canals, both ears  Nose:   Nares normal, septum midline, mucosa normal, no drainage    or sinus tenderness  Throat:   Lips, mucosa, and tongue normal; teeth and gums normal  Neck:   Supple, symmetrical,  trachea midline, no adenopathy;    Thyroid: no enlargement/tenderness/nodules  Back:     Symmetric, no curvature, ROM normal, no CVA tenderness  Lungs:     Clear to auscultation bilaterally, respirations unlabored  Chest Wall:    No tenderness or deformity   Heart:    Regular rate and rhythm, S1 and S2 normal, no murmur, rub   or gallop  Breast Exam:    No tenderness, masses, or nipple abnormality  Abdomen:     Soft, non-tender, bowel sounds active all four quadrants,    no masses, no organomegaly  Genitalia:    External genitalia normal, s/p hysterectomy, adnexa w/out mass or tenderness, mucosa pink and moist, no lesions or discharge present  Rectal:    Normal external appearance  Extremities:   Extremities normal, atraumatic, no cyanosis or edema  Pulses:   2+ and symmetric all extremities  Skin:   Skin color, texture, turgor normal, no rashes or lesions  Lymph nodes:   Cervical, supraclavicular, and axillary nodes normal  Neurologic:   CNII-XII intact, normal strength, sensation and reflexes    throughout          Assessment & Plan:

## 2011-07-06 ENCOUNTER — Telehealth: Payer: Self-pay | Admitting: *Deleted

## 2011-07-06 NOTE — Telephone Encounter (Signed)
Pt states that she just remeber why lipitor was stopped previously. Pt note that since starting back on med she has had increase nausea. Pt is requesting to be change to another med.Please advise

## 2011-07-06 NOTE — Telephone Encounter (Signed)
Can switch to Crestor 10mg - please provide samples to see if she tolerates this medicine

## 2011-07-06 NOTE — Telephone Encounter (Signed)
Pt notified and aware of lab results. Samples ready for pick up

## 2011-07-07 ENCOUNTER — Telehealth: Payer: Self-pay

## 2011-07-07 NOTE — Telephone Encounter (Signed)
Call from patient, she stated she started having Vertigo today. Denied any visual issues, sinus issues, no CP or Cardiac issuse. She stated she has had this issue in the past and not sure what her diagnosis was but she wanted to wait to see Dr.Tabori tomorrow. I offered her Dr.Lowne today and she declined. Appt scheduled 8/29 @ 2 pm and patient has been advised to call sooner if any other symptome present. She agreed    KP

## 2011-07-08 ENCOUNTER — Ambulatory Visit (INDEPENDENT_AMBULATORY_CARE_PROVIDER_SITE_OTHER): Payer: Medicare Other | Admitting: Family Medicine

## 2011-07-08 DIAGNOSIS — R42 Dizziness and giddiness: Secondary | ICD-10-CM

## 2011-07-08 NOTE — Patient Instructions (Signed)
This sounds like your blood sugar dropped Make sure you are eating regularly to avoid your sugar dropping Call with any questions or concerns Hang in there!!!

## 2011-07-08 NOTE — Assessment & Plan Note (Signed)
Pt's sxs are most consistent w/ hypoglycemia- pt did not eat breakfast and sxs improved ~1 hr after eating.  Asymptomatic today.  Neuro exam normal.  Not orthostatic.  Encouraged pt to eat regularly and not skip meals.  Reviewed importance of protein at each meal to stabilize blood sugar.  Pt to call if sxs return.  Pt expressed understanding and is in agreement w/ plan.

## 2011-07-08 NOTE — Progress Notes (Signed)
  Subjective:    Patient ID: [name], female    DOB: 09/20/1956, 55 y.o.   MRN: [id number]  HPI Dizziness- went to get eyes checked yesterday and had 'severe dizzy spell'.  Had sensation of room spinning, had difficulty walking upright.  Episode lasted 30 minutes and then had 2nd episode that lasted an hour.  No dizziness today- just fatigue.  sxs started when pt stood up to go into exam room.  Had not eaten breakfast.  Pt had sensation of about to pass out.  Was sweaty, shaking.  sxs improved ~1 hr after eating.  Asymptomatic today.   Review of Systems For ROS see HPI     Objective:   Physical Exam  Vitals reviewed. Constitutional: She is oriented to person, place, and time. She appears well-developed and well-nourished. No distress.  HENT:  Head: Normocephalic and atraumatic.  Eyes: Conjunctivae and EOM are normal. Pupils are equal, round, and reactive to light.  Neck: Normal range of motion. Neck supple. No thyromegaly present.  Cardiovascular: Normal rate, regular rhythm, normal heart sounds and intact distal pulses.   No murmur heard. Pulmonary/Chest: Effort normal and breath sounds normal. No respiratory distress. She has no wheezes. She has no rales.  Lymphadenopathy:    She has no cervical adenopathy.  Neurological: She is alert and oriented to person, place, and time. She has normal reflexes. No cranial nerve deficit. Coordination normal.  Psychiatric: She has a normal mood and affect. Her behavior is normal. Judgment and thought content normal.          Assessment & Plan:

## 2011-07-09 ENCOUNTER — Encounter: Payer: Self-pay | Admitting: Family Medicine

## 2011-07-09 NOTE — Assessment & Plan Note (Signed)
Check labs 

## 2011-07-09 NOTE — Assessment & Plan Note (Signed)
Reviewed results of [facility] lab w/ pt.  Given extensive family hx and cholesterol level pt needs to start statin to reduce her risk of MI/CVA.  Pt expressed understanding and is in agreement w/ plan.

## 2011-07-09 NOTE — Assessment & Plan Note (Signed)
Pt's PE WNL.  UTD on mammo and colonoscopy.  Check labs.  Anticipatory guidance provided.  

## 2011-07-16 ENCOUNTER — Telehealth: Payer: Self-pay

## 2011-07-16 NOTE — Telephone Encounter (Signed)
Message copied by Beverely Low on Thu Jul 16, 2011 11:38 AM ------      Message from: Sheliah Hatch      Created: Thu Jul 16, 2011  9:02 AM       Normal pap- no evidence of BV

## 2011-07-16 NOTE — Telephone Encounter (Signed)
Results mailed 

## 2011-07-20 ENCOUNTER — Other Ambulatory Visit: Payer: Self-pay | Admitting: Family Medicine

## 2011-07-20 MED ORDER — MONTELUKAST SODIUM 10 MG PO TABS: 10.0000 mg | ORAL_TABLET | Freq: Every day | ORAL | Status: DC

## 2011-07-20 NOTE — Telephone Encounter (Signed)
rx sent into pharmacy

## 2011-07-22 ENCOUNTER — Other Ambulatory Visit: Payer: Self-pay | Admitting: Family Medicine

## 2011-07-24 ENCOUNTER — Other Ambulatory Visit: Payer: Self-pay | Admitting: Family Medicine

## 2011-07-24 NOTE — Telephone Encounter (Signed)
Left message to call office to see if Pt is having symptoms, if so OV will be needed.

## 2011-07-28 NOTE — Telephone Encounter (Signed)
Patient complains of a fishy smell that will not go away.  Denies itching or irritations.  Wants the flagyl called in because it helped last time she had this smell.

## 2011-07-28 NOTE — Telephone Encounter (Signed)
Pt recently had pap and was (-) for BV.  Will need to have repeat testing done rather than take a medicine that isn't indicated

## 2011-08-04 ENCOUNTER — Telehealth: Payer: Self-pay | Admitting: Family Medicine

## 2011-08-04 NOTE — Telephone Encounter (Signed)
I spoke with patient and she indicated she understood and would schedule an appointment.

## 2011-08-05 ENCOUNTER — Ambulatory Visit: Payer: Medicare Other | Admitting: Family Medicine

## 2011-08-05 MED ORDER — ROSUVASTATIN CALCIUM 10 MG PO TABS: 10.0000 mg | ORAL_TABLET | Freq: Every day | ORAL | Status: DC

## 2011-08-05 NOTE — Telephone Encounter (Signed)
Crestor 10mg

## 2011-08-05 NOTE — Telephone Encounter (Signed)
Done

## 2011-08-06 ENCOUNTER — Ambulatory Visit (INDEPENDENT_AMBULATORY_CARE_PROVIDER_SITE_OTHER): Payer: Medicare Other | Admitting: Family Medicine

## 2011-08-06 ENCOUNTER — Encounter: Payer: Self-pay | Admitting: Family Medicine

## 2011-08-06 DIAGNOSIS — N898 Other specified noninflammatory disorders of vagina: Secondary | ICD-10-CM | POA: Insufficient documentation

## 2011-08-06 DIAGNOSIS — N949 Unspecified condition associated with female genital organs and menstrual cycle: Secondary | ICD-10-CM

## 2011-08-06 NOTE — Progress Notes (Signed)
  Subjective:    Patient ID: [name], female    DOB: September 03, 1956, 55 y.o.   MRN: [id number]  HPI Vaginal odor- pt reports strong odor despite washing regularly.  No discharge.  Reports this worsens w/ sweating.  If very self conscious about this.  Doesn't know what to do.   Review of Systems For ROS see HPI     Objective:   Physical Exam  Vitals reviewed. Constitutional: She is oriented to person, place, and time. She appears well-developed and well-nourished. No distress.  Genitourinary:       Deferred at pt's request  Neurological: She is alert and oriented to person, place, and time.  Skin: Skin is warm and dry.          Assessment & Plan:

## 2011-08-06 NOTE — Assessment & Plan Note (Signed)
Pt's odor may be caused by pH abnormality.  Start RePHresh OTC.  If sxs don't improve in 2 weeks will refer to GYN.  Pt expressed understanding and is in agreement w/ plan.

## 2011-08-06 NOTE — Patient Instructions (Signed)
Try the RePHresh gel for the odor If no better in 2 weeks- call me and we'll refer you to GYN Follow up as scheduled in November Call with any questions or concerns Hang in there!!!

## 2011-08-18 ENCOUNTER — Ambulatory Visit (INDEPENDENT_AMBULATORY_CARE_PROVIDER_SITE_OTHER): Payer: Medicare Other | Admitting: Internal Medicine

## 2011-08-18 ENCOUNTER — Encounter: Payer: Self-pay | Admitting: Internal Medicine

## 2011-08-18 VITALS — BP 102/68 | HR 76 | Temp 98.9°F | Resp 14 | Wt 227.0 lb

## 2011-08-18 DIAGNOSIS — N390 Urinary tract infection, site not specified: Secondary | ICD-10-CM

## 2011-08-18 DIAGNOSIS — R319 Hematuria, unspecified: Secondary | ICD-10-CM

## 2011-08-18 LAB — POCT URINALYSIS DIPSTICK
Bilirubin, UA: NEGATIVE
Ketones, UA: NEGATIVE
Spec Grav, UA: 1.005
pH, UA: 8.5

## 2011-08-18 MED ORDER — NITROFURANTOIN MONOHYD MACRO 100 MG PO CAPS: 100.0000 mg | ORAL_CAPSULE | Freq: Two times a day (BID) | ORAL | Status: AC

## 2011-08-18 NOTE — Patient Instructions (Signed)
Lots of fluids macrobid x 5 days Call if fevers or not getting better in few days

## 2011-08-18 NOTE — Progress Notes (Signed)
  Subjective:    Patient ID: [name], female    DOB: 09-Sep-1956, 55 y.o.   MRN: [id number]  HPI Today noted some blood in the urine, a few drops in the toilet paper. Also dysuria. She was recently seen with a "vaginal odor" using over-the-counter cream, the odor  has diminished.  Past medical history reviewed  Past surgical history reviewed   Review of Systems Denies any fever or chills She has been getting slightly now she is at but no vomiting. Denies lower abdominal pain or flank pain. Denies any vaginal bleeding or vaginal discharge per se.    Objective:   Physical Exam  Constitutional: She appears well-developed and well-nourished. No distress.  Cardiovascular: Normal rate, regular rhythm and normal heart sounds.   No murmur heard. Pulmonary/Chest: Effort normal and breath sounds normal. No respiratory distress. She has no wheezes. She has no rales.  Abdominal: Soft. Bowel sounds are normal. She exhibits no distension. There is no tenderness. There is no rebound and no guarding.       No CVA tenderness  Musculoskeletal: She exhibits no edema.  Skin: She is not diaphoretic.          Assessment & Plan:

## 2011-08-18 NOTE — Assessment & Plan Note (Signed)
Symptoms and urinalysis consistent with UTI. Cipro interact with some of her medicines so will use Macrobid. See instructions. She does not have at the present time any vaginitis symptoms except for  A "odor" which is getting better.

## 2011-08-19 ENCOUNTER — Ambulatory Visit: Payer: Medicare Other | Admitting: Family Medicine

## 2011-08-22 LAB — CULTURE, URINE COMPREHENSIVE

## 2011-09-07 ENCOUNTER — Telehealth: Payer: Self-pay | Admitting: *Deleted

## 2011-09-07 DIAGNOSIS — N898 Other specified noninflammatory disorders of vagina: Secondary | ICD-10-CM

## 2011-09-07 NOTE — Telephone Encounter (Signed)
Ok to refer- no need for OV

## 2011-09-07 NOTE — Telephone Encounter (Signed)
Discuss with patient referral put in.  

## 2011-09-07 NOTE — Telephone Encounter (Addendum)
Pt left VM on Friday that vaginal odor is still no better and would like to be referred to GYN.Pt saw Paz on 08-18-11 for UTI and issue was also discuss .Please advise if you would like to see Pt or refer.

## 2011-09-08 ENCOUNTER — Other Ambulatory Visit: Payer: Self-pay | Admitting: Orthopedic Surgery

## 2011-09-08 DIAGNOSIS — M25562 Pain in left knee: Secondary | ICD-10-CM

## 2011-09-09 ENCOUNTER — Ambulatory Visit
Admission: RE | Admit: 2011-09-09 | Discharge: 2011-09-09 | Disposition: A | Discharge status: Home / Self Care | Payer: Medicare Other | Source: Ambulatory Visit | Attending: Orthopedic Surgery | Admitting: Orthopedic Surgery

## 2011-09-09 DIAGNOSIS — M25562 Pain in left knee: Secondary | ICD-10-CM

## 2011-09-14 ENCOUNTER — Encounter: Payer: Self-pay | Admitting: Family Medicine

## 2011-09-14 ENCOUNTER — Ambulatory Visit (INDEPENDENT_AMBULATORY_CARE_PROVIDER_SITE_OTHER): Payer: Medicare Other | Admitting: Family Medicine

## 2011-09-14 DIAGNOSIS — R7309 Other abnormal glucose: Secondary | ICD-10-CM

## 2011-09-14 DIAGNOSIS — E785 Hyperlipidemia, unspecified: Secondary | ICD-10-CM

## 2011-09-14 DIAGNOSIS — M25569 Pain in unspecified knee: Secondary | ICD-10-CM | POA: Insufficient documentation

## 2011-09-14 DIAGNOSIS — R739 Hyperglycemia, unspecified: Secondary | ICD-10-CM

## 2011-09-14 LAB — BASIC METABOLIC PANEL
CO2: 27 mEq/L (ref 19–32)
Chloride: 105 mEq/L (ref 96–112)
Creatinine, Ser: 0.8 mg/dL (ref 0.4–1.2)

## 2011-09-14 LAB — HEPATIC FUNCTION PANEL
Albumin: 4.8 g/dL (ref 3.5–5.2)
Alkaline Phosphatase: 47 U/L (ref 39–117)
Bilirubin, Direct: 0 mg/dL (ref 0.0–0.3)

## 2011-09-14 NOTE — Progress Notes (Signed)
  Subjective:    Patient ID: [name], female    DOB: 1956-10-09, 55 y.o.   MRN: [id number]  HPI Knee pain- needs upcoming R TKR.  Ortho requesting medical clearance.  Had recent normal CPE.  Hyperlipidemia- on Crestor nightly.  Denies abdominal pain, N/V, myalgias.  Hyperglycemia- discovered on last labs.  Has changed diet, attempting to walk daily- this is limited by knee pain.  Denies dizziness, shaky feeling, CP, SOB, HAs, visual changes.   Review of Systems For ROS see HPI     Objective:   Physical Exam  Vitals reviewed. Constitutional: She is oriented to person, place, and time. She appears well-developed and well-nourished. No distress.  HENT:  Head: Normocephalic and atraumatic.  Eyes: Conjunctivae and EOM are normal. Pupils are equal, round, and reactive to light.  Neck: Normal range of motion. Neck supple. No thyromegaly present.  Cardiovascular: Normal rate, regular rhythm, normal heart sounds and intact distal pulses.   No murmur heard. Pulmonary/Chest: Effort normal and breath sounds normal. No respiratory distress.  Abdominal: Soft. She exhibits no distension. There is no tenderness.  Musculoskeletal: She exhibits no edema.  Lymphadenopathy:    She has no cervical adenopathy.  Neurological: She is alert and oriented to person, place, and time.  Skin: Skin is warm and dry.  Psychiatric: She has a normal mood and affect. Her behavior is normal.          Assessment & Plan:

## 2011-09-14 NOTE — Assessment & Plan Note (Signed)
Chronic problem for pt- on Crestor.  Check NMR given pt's poor [facility] Profile earlier this year.  Adjust meds prn.

## 2011-09-14 NOTE — Assessment & Plan Note (Signed)
Pt has upcoming TKR scheduled.  Will send clearance letter along w/ most recent lab results.  No reason at this time why pt can't proceed.

## 2011-09-14 NOTE — Assessment & Plan Note (Signed)
Pt has lost 15 lbs in 1 month by changing diet and limiting carb intake.  Applauded her efforts.  Will check fasting BMP.

## 2011-09-14 NOTE — Patient Instructions (Signed)
Follow up in 6 months to recheck cholesterol We'll fax the clearance letter to the surgeon [facility] notify you of your lab results Keep up the good work!!!  You look great and I'm so proud of you!!! Call with any questions or concerns Good luck w/ surgery!!!

## 2011-09-15 LAB — NMR LIPOPROFILE WITH LIPIDS
Cholesterol, Total: 195 mg/dL (ref <200)
HDL Particle Number: 40.7 umol/L (ref >=30.5)
LDL (calc): 103 mg/dL — ABNORMAL HIGH (ref <100)
LP-IR Score: 70 — ABNORMAL HIGH (ref <=45)

## 2011-09-16 ENCOUNTER — Other Ambulatory Visit: Payer: Self-pay | Admitting: *Deleted

## 2011-09-16 MED ORDER — FENOFIBRATE 160 MG PO TABS: 160.0000 mg | ORAL_TABLET | Freq: Every day | ORAL | Status: DC

## 2011-09-16 NOTE — Telephone Encounter (Signed)
Spoke to pt about lab results and new rx for tricor, pt understood. rx sent to pharmacy by e-script

## 2011-09-29 ENCOUNTER — Telehealth: Payer: Self-pay | Admitting: *Deleted

## 2011-09-29 ENCOUNTER — Telehealth: Payer: Self-pay

## 2011-09-29 NOTE — Telephone Encounter (Signed)
I tried to work patient in for an apt to be seen for her UTI in the morning and she stated she [facility] and she is upset because she was told we did not have any appointments here. She said she felt like the person who answered the phone did not care and she really wanted to talk to Dr.Tabori about it. She will wait until she is back in the office or she is willing to speak with Harriett Sine when he came back in the office. i apologized for the inconvenience and she voiced understanding, I advised I would deliver the message and someone will reach her .   KPP

## 2011-09-29 NOTE — Telephone Encounter (Signed)
Pt left VM that she thinks that she may have UTI again because she has the same symptoms again. Pt c/o nausea, dark urine and fatigue. Pt is requesting to have the same antibiotic Rx again. Left Pt detail message advising her the OV will be needed in order to Rx a antibiotic, Pt to return call to schedule OV.

## 2011-09-30 NOTE — Telephone Encounter (Signed)
I will remind Md on Monday and Harriett Sine on Friday as well.

## 2011-09-30 NOTE — Telephone Encounter (Signed)
This is a very sweet, typically low maintenance pt.  I hate that she's upset or feels unattended to.  Harriett Sine- please call her on Friday if you have time and I'll try and get in touch w/ her on Monday Selena Batten- please remind me!).

## 2011-10-05 ENCOUNTER — Other Ambulatory Visit: Payer: Self-pay | Admitting: *Deleted

## 2011-10-06 MED ORDER — ATENOLOL 50 MG PO TABS: 50.0000 mg | ORAL_TABLET | Freq: Every day | ORAL | Status: DC

## 2011-10-06 NOTE — Telephone Encounter (Signed)
rx sent to pharmacy by e-script  

## 2011-10-08 NOTE — Telephone Encounter (Signed)
Advised by nancy verbally that she spoke to pt about the matter.

## 2011-10-12 ENCOUNTER — Encounter: Payer: Self-pay | Admitting: Family Medicine

## 2011-10-13 ENCOUNTER — Telehealth: Payer: Self-pay | Admitting: *Deleted

## 2011-10-13 NOTE — Telephone Encounter (Signed)
Called pt to set up an EKG visit per MD tabori, left message for pt to call office to schedule a appt for just an ekg.

## 2011-10-29 ENCOUNTER — Encounter: Payer: Self-pay | Admitting: Family Medicine

## 2011-10-29 ENCOUNTER — Ambulatory Visit (INDEPENDENT_AMBULATORY_CARE_PROVIDER_SITE_OTHER): Payer: Medicare Other | Admitting: Family Medicine

## 2011-10-29 VITALS — BP 118/75 | HR 65 | Temp 98.5°F | Ht 69.0 in | Wt 222.2 lb

## 2011-10-29 DIAGNOSIS — Z01818 Encounter for other preprocedural examination: Secondary | ICD-10-CM

## 2011-10-29 NOTE — Progress Notes (Signed)
  Subjective:    Patient ID: [name], female    DOB: 1956-08-07, 55 y.o.   MRN: [id number]  HPI Surgical clearance- pt here today for pre-op EKG.  Surgical clearance letter already written based on recent CPE.   Review of Systems For ROS see HPI     Objective:   Physical Exam  Vitals reviewed. Constitutional: She appears well-developed and well-nourished. No distress.          Assessment & Plan:

## 2011-10-29 NOTE — Assessment & Plan Note (Signed)
Pt had normal PE recently.  Had EKG today.  Letter written to clear pt for surgery.

## 2011-10-30 ENCOUNTER — Other Ambulatory Visit (HOSPITAL_COMMUNITY): Payer: Medicare Other

## 2011-10-30 ENCOUNTER — Telehealth: Payer: Self-pay | Admitting: *Deleted

## 2011-10-30 NOTE — Telephone Encounter (Signed)
Sent letter via fax to MD [facility] office for surgery verifaction

## 2011-11-09 ENCOUNTER — Other Ambulatory Visit (INDEPENDENT_AMBULATORY_CARE_PROVIDER_SITE_OTHER): Payer: Medicare Other

## 2011-11-09 ENCOUNTER — Telehealth: Payer: Self-pay | Admitting: *Deleted

## 2011-11-09 DIAGNOSIS — N390 Urinary tract infection, site not specified: Secondary | ICD-10-CM

## 2011-11-09 LAB — POCT URINALYSIS DIPSTICK
Bilirubin, UA: NEGATIVE
Glucose, UA: NEGATIVE
Ketones, UA: NEGATIVE
Leukocytes, UA: NEGATIVE
Nitrite, UA: NEGATIVE

## 2011-11-09 MED ORDER — NITROFURANTOIN MONOHYD MACRO 100 MG PO CAPS: 100.0000 mg | ORAL_CAPSULE | Freq: Two times a day (BID) | ORAL | Status: AC

## 2011-11-09 NOTE — Progress Notes (Signed)
Addended by: Candie Echevaria L on: 11/09/2011 03:25 PM   Modules accepted: Orders

## 2011-11-09 NOTE — Telephone Encounter (Signed)
Pt had urine sample analyzed in lab via urine dipstick and MD Tabori noted pt need for ABT, sent via escribe for Marobid BID x 7 days #14 tablets

## 2011-11-11 LAB — URINE CULTURE: Colony Count: 65000

## 2011-11-17 ENCOUNTER — Encounter: Payer: Self-pay | Admitting: Family Medicine

## 2011-11-17 ENCOUNTER — Ambulatory Visit (INDEPENDENT_AMBULATORY_CARE_PROVIDER_SITE_OTHER): Payer: Medicare Other | Admitting: Family Medicine

## 2011-11-17 VITALS — BP 110/70 | HR 71 | Temp 98.3°F | Ht 69.0 in | Wt 217.4 lb

## 2011-11-17 DIAGNOSIS — R52 Pain, unspecified: Secondary | ICD-10-CM

## 2011-11-17 DIAGNOSIS — N390 Urinary tract infection, site not specified: Secondary | ICD-10-CM

## 2011-11-17 LAB — POCT URINALYSIS DIPSTICK
Blood, UA: NEGATIVE
Glucose, UA: NEGATIVE
Nitrite, UA: NEGATIVE
Spec Grav, UA: 1.01
Urobilinogen, UA: 0.2
pH, UA: 7.5

## 2011-11-17 NOTE — Assessment & Plan Note (Signed)
Given recent UTIs and pt's concern due to upcoming surgery, will refer to urology.  UA clear today, no abx needed.  Pt expressed understanding and is in agreement w/ plan.

## 2011-11-17 NOTE — Progress Notes (Signed)
  Subjective:    Patient ID: [name], female    DOB: 08/29/1956, 56 y.o.   MRN: [id number]  HPI Frequent UTI- having dysuria but this has improved.  Has had 3 infections since Oct (we only have 2 urine cxs but pt also sees GYN).  Concerned about upcoming surgery.  UA today is clear.  Recent infxns have been 40,000 e coli and then 65,000 mixed flora.  Has never seen uro.   Review of Systems For ROS see HPI     Objective:   Physical Exam  Vitals reviewed. Constitutional: She appears well-developed and well-nourished. No distress.  Abdominal: Soft. She exhibits no distension. There is no tenderness (no suprapubic or CVA tenderness).          Assessment & Plan:

## 2011-11-17 NOTE — Patient Instructions (Signed)
We'll call you with your Urology appt Continue to drink LOTS of fluids Call with any questions or concerns Happy New Year!!!

## 2011-12-08 ENCOUNTER — Encounter (HOSPITAL_COMMUNITY): Payer: Self-pay | Admitting: Pharmacy Technician

## 2011-12-08 ENCOUNTER — Other Ambulatory Visit (HOSPITAL_COMMUNITY): Payer: Medicare Other

## 2011-12-16 ENCOUNTER — Encounter (HOSPITAL_COMMUNITY): Payer: Self-pay

## 2011-12-16 ENCOUNTER — Encounter (HOSPITAL_COMMUNITY)
Admission: RE | Admit: 2011-12-16 | Discharge: 2011-12-16 | Disposition: A | Discharge status: Home / Self Care | Payer: Medicare Other | Source: Ambulatory Visit | Attending: Orthopedic Surgery | Admitting: Orthopedic Surgery

## 2011-12-16 ENCOUNTER — Encounter (HOSPITAL_COMMUNITY)
Admission: RE | Admit: 2011-12-16 | Discharge: 2011-12-16 | Disposition: A | Discharge status: Home / Self Care | Payer: Medicare Other | Source: Ambulatory Visit | Attending: Surgery | Admitting: Surgery

## 2011-12-16 ENCOUNTER — Inpatient Hospital Stay: Admit: 2011-12-16 | Payer: Self-pay | Admitting: Orthopedic Surgery

## 2011-12-16 HISTORY — DX: Unspecified osteoarthritis, unspecified site: M19.90

## 2011-12-16 HISTORY — DX: Urinary tract infection, site not specified: N39.0

## 2011-12-16 HISTORY — DX: Cardiac arrhythmia, unspecified: I49.9

## 2011-12-16 LAB — CBC
MCH: 31.1 pg (ref 26.0–34.0)
MCV: 91.1 fL (ref 78.0–100.0)
Platelets: 261 10*3/uL (ref 150–400)
RBC: 3.92 MIL/uL (ref 3.87–5.11)
RDW: 13.2 % (ref 11.5–15.5)

## 2011-12-16 LAB — URINALYSIS, ROUTINE W REFLEX MICROSCOPIC
Bilirubin Urine: NEGATIVE
Glucose, UA: NEGATIVE mg/dL
Hgb urine dipstick: NEGATIVE
Ketones, ur: NEGATIVE mg/dL
Leukocytes, UA: NEGATIVE
Protein, ur: NEGATIVE mg/dL

## 2011-12-16 LAB — SURGICAL PCR SCREEN: MRSA, PCR: NEGATIVE

## 2011-12-16 LAB — COMPREHENSIVE METABOLIC PANEL
AST: 19 U/L (ref 0–37)
BUN: 14 mg/dL (ref 6–23)
CO2: 25 mEq/L (ref 19–32)
Calcium: 9.8 mg/dL (ref 8.4–10.5)
Creatinine, Ser: 0.82 mg/dL (ref 0.50–1.10)
GFR calc non Af Amer: 80 mL/min — ABNORMAL LOW (ref >90)

## 2011-12-16 LAB — PROTIME-INR
INR: 1.06 (ref 0.00–1.49)
Prothrombin Time: 14 seconds (ref 11.6–15.2)

## 2011-12-16 LAB — TYPE AND SCREEN: ABO/RH(D): A POS

## 2011-12-16 SURGERY — ARTHROPLASTY, KNEE, TOTAL
Anesthesia: General | Laterality: Right

## 2011-12-16 NOTE — Progress Notes (Addendum)
Pts BP was 93/68 on arrival to pre- admit.  Pt states that she had to take extra dose of Seroquel on Tues night .  BP rechecked and 109/ 62 obtained.

## 2011-12-16 NOTE — H&P (Signed)
  [facility] [address]   [address] [place], [place] [address] [phone] A Division of [facility]  Loreta Ave, M.D.     Robert A. Thurston Hole, M.D.     Lunette Stands, M.D. Eulas Post, M.D.    Buford Dresser, M.D. Estell Harpin, M.D. Ralene Cork, D.O.          Genene Churn. Barry Dienes, PA-C            Kirstin A. Shepperson, PA-C [place], OPA-C   RE: Melanie, Hill   [id number]      DOB: October 08, 1956 PROGRESS NOTE: 12-11-11 Chief complaint: right knee pain. History of present illness: 56 year old white female with history of end stage degenerative joint disease right knee and chronic pain returns. Symptoms are unchanged from previous visit. She is wanting to proceed with total knee replacement as scheduled.  She states she's had a long history with constipation and is concerned about this post-op. This has been discussed with her primary care physician but she has not seen a gastroenterologist for workup. No abdominal pain melana hematochezia.  Current medications: Neurontin Seroquel Klonopin Lamictal Atenolol.  Drug allergies: Xanax.  Past medical/surgical history bipolar disorder asthma hypertension hysterectomy lumbar spine surgery tonsillectomy.  Family history: positive hypertension. Social history: she is single and retired. Admits occasional alcohol use. Denies smoking. Review of systems: all other systems unremarkable.  EXAMINATION: Height 5'8" weight 238 pounds. Blood pressure 111/78 pulse 74. Alert and oriented x3 in no acute distress. Head normal cephalic atraumatic. PERRLA Extraocular motion is intact. Cervical spine unremarkable. Lungs CTA bilaterally no wheezes noted. Heart regular rate and rhythm. S1 S2. Abdomen round non-distended. NBSx4 soft non-tender. Right knee decreased range of motion. Positive crepitus. Joint line tenderness. 1 to 2+ effusion. Ligaments stable. Bilateral calves non-tender  neurovascularly intact. Skin warm and dry.   IMPRESSION: Right knee end stage degenerative joint disease with chronic pain. Failed conservative treatment.  DISPOSITION: We'll proceed with right total knee replacement as scheduled. Discussed risks benefits and possible complications in detail. All questions answered. In regards to chronic constipation we feel best to avoid post-op problems. I recommend she discuss this with her primary care physician to see if gastroenterology consult is needed.   Loreta Ave, M.D. Electronically verified by Loreta Ave, M.D. DFM(JMO):kh cc:  Neena Rhymes, MD fax [phone] D 12-14-11 T 12-14-11

## 2011-12-16 NOTE — Pre-Procedure Instructions (Signed)
[name]  12/16/2011   Your procedure is scheduled on:  Wednesday, February 13th.   Report to [facility] at  9:00  AM.  Call this number if you have problems the morning of surgery: [phone]   Remember:   Do not eat food:After Midnight.  May have clear liquids: up to 4 Hours before arrival.   Clear liquids include soda, tea, black coffee, apple or grape juice, broth.  Take these medicines the morning of surgery with A SIP OF WATER: Altenol, Zyprexa, Singular, Lamictal, Allegra, Premarin. May use Flonase.   Do not wear jewelry, make-up or nail polish.  Do not wear lotions, powders, or perfumes. You may wear deodorant.  Do not shave 48 hours prior to surgery.  Do not bring valuables to the hospital.  Contacts, dentures or bridgework may not be worn into surgery.  Leave suitcase in the car. After surgery it may be brought to your room.  For patients admitted to the hospital, checkout time is 11:00 AM the day of discharge.   Patients discharged the day of surgery will not be allowed to drive home.  Name and phone number of your driver: ---  Special Instructions: CHG Shower Use Special Wash: 1/2 bottle night before surgery and 1/2 bottle morning of surgery.   Please read over the following fact sheets that you were given: Pain Booklet, Coughing and Deep Breathing, Blood Transfusion Information, Total Joint Packet, MRSA Information and Surgical Site Infection Prevention

## 2011-12-17 ENCOUNTER — Other Ambulatory Visit: Payer: Self-pay | Admitting: Family Medicine

## 2011-12-17 MED ORDER — FLUTICASONE PROPIONATE 50 MCG/ACT NA SUSP: 2.0000 | Freq: Every day | NASAL | Status: DC

## 2011-12-17 NOTE — Telephone Encounter (Signed)
rx sent to pharmacy by e-script  

## 2011-12-22 ENCOUNTER — Other Ambulatory Visit: Payer: Self-pay | Admitting: Family Medicine

## 2011-12-22 ENCOUNTER — Telehealth: Payer: Self-pay | Admitting: Family Medicine

## 2011-12-22 MED ORDER — CEFAZOLIN SODIUM-DEXTROSE 2-3 GM-% IV SOLR
2.0000 g | INTRAVENOUS | Status: AC
  Administered 2011-12-23: 2 g via INTRAVENOUS
  Filled 2011-12-22: qty 50

## 2011-12-22 MED ORDER — BECLOMETHASONE DIPROPIONATE 80 MCG/ACT IN AERS: 2.0000 | INHALATION_SPRAY | Freq: Two times a day (BID) | RESPIRATORY_TRACT | Status: DC

## 2011-12-22 NOTE — Telephone Encounter (Signed)
rx sent to pharmacy by e-script  

## 2011-12-22 NOTE — Telephone Encounter (Signed)
Opened in error

## 2011-12-22 NOTE — Telephone Encounter (Signed)
Refill- QVAR inhaler. Inhale 2 puffs two times a day. Qty 8.7gm. Last fill 2.6.11

## 2011-12-22 NOTE — Telephone Encounter (Signed)
Called and left Vm for pt on both lines to advise the Q-var has been sent to [facility].

## 2011-12-22 NOTE — Telephone Encounter (Signed)
Please refill Qvar for her

## 2011-12-22 NOTE — Telephone Encounter (Signed)
To: [facility] (Daytime Triage) Fax: [phone] From: [facility] Date/ Time: 12/22/2011 12:00 PM Taken By: Annalee Genta, CSR Caller: Chad Facility: not collected Patient: Melanie, Hill DOB: 1956-04-07 Phone: [phone] Reason for Call: Please call pt re Qvar inhaler. She advises pharmacy has requested refill with no response. She is scheduled to be admitted tomorrow and needs this today. Regarding Appointment: Appt Date: Appt Time: Unknown

## 2011-12-23 ENCOUNTER — Inpatient Hospital Stay (HOSPITAL_COMMUNITY)
Admission: RE | Admit: 2011-12-23 | Discharge: 2011-12-26 | DRG: 470 | Disposition: A | Discharge status: Home Health | Payer: Medicare Other | Source: Ambulatory Visit | Attending: Orthopedic Surgery | Admitting: Orthopedic Surgery

## 2011-12-23 ENCOUNTER — Encounter (HOSPITAL_COMMUNITY): Payer: Self-pay | Admitting: Certified Registered"

## 2011-12-23 ENCOUNTER — Encounter (HOSPITAL_COMMUNITY)
Admission: RE | Disposition: A | Discharge status: Home Health | Payer: Self-pay | Source: Ambulatory Visit | Attending: Orthopedic Surgery

## 2011-12-23 ENCOUNTER — Ambulatory Visit (HOSPITAL_COMMUNITY): Payer: Medicare Other

## 2011-12-23 ENCOUNTER — Ambulatory Visit (HOSPITAL_COMMUNITY): Payer: Medicare Other | Admitting: Certified Registered"

## 2011-12-23 DIAGNOSIS — M1710 Unilateral primary osteoarthritis, unspecified knee: Principal | ICD-10-CM | POA: Diagnosis present

## 2011-12-23 DIAGNOSIS — Z471 Aftercare following joint replacement surgery: Secondary | ICD-10-CM

## 2011-12-23 DIAGNOSIS — Z01818 Encounter for other preprocedural examination: Secondary | ICD-10-CM

## 2011-12-23 DIAGNOSIS — D62 Acute posthemorrhagic anemia: Secondary | ICD-10-CM | POA: Diagnosis not present

## 2011-12-23 DIAGNOSIS — Z01812 Encounter for preprocedural laboratory examination: Secondary | ICD-10-CM

## 2011-12-23 DIAGNOSIS — Z888 Allergy status to other drugs, medicaments and biological substances status: Secondary | ICD-10-CM

## 2011-12-23 DIAGNOSIS — G8929 Other chronic pain: Secondary | ICD-10-CM | POA: Diagnosis present

## 2011-12-23 HISTORY — PX: TOTAL KNEE ARTHROPLASTY: SHX125

## 2011-12-23 SURGERY — ARTHROPLASTY, KNEE, TOTAL
Anesthesia: Regional | Laterality: Right | Wound class: Clean

## 2011-12-23 MED ORDER — MENTHOL 3 MG MT LOZG: 1.0000 | LOZENGE | OROMUCOSAL | Status: DC | PRN

## 2011-12-23 MED ORDER — FLEET ENEMA 7-19 GM/118ML RE ENEM: 1.0000 | ENEMA | Freq: Once | RECTAL | Status: AC | PRN

## 2011-12-23 MED ORDER — DROPERIDOL 2.5 MG/ML IJ SOLN
INTRAMUSCULAR | Status: DC | PRN
  Administered 2011-12-23: 0.625 mg via INTRAVENOUS

## 2011-12-23 MED ORDER — QUETIAPINE FUMARATE ER 50 MG PO TB24
450.0000 mg | ORAL_TABLET | Freq: Every day | ORAL | Status: DC
  Administered 2011-12-23: 400 mg via ORAL
  Administered 2011-12-24: 300 mg via ORAL
  Administered 2011-12-25: 400 mg via ORAL
  Filled 2011-12-23 (×4): qty 1

## 2011-12-23 MED ORDER — MIDAZOLAM HCL 5 MG/5ML IJ SOLN
INTRAMUSCULAR | Status: DC | PRN
  Administered 2011-12-23: 2 mg via INTRAVENOUS

## 2011-12-23 MED ORDER — LAMOTRIGINE 200 MG PO TABS
200.0000 mg | ORAL_TABLET | Freq: Two times a day (BID) | ORAL | Status: DC
  Administered 2011-12-23: 100 mg via ORAL
  Administered 2011-12-24 – 2011-12-26 (×4): 200 mg via ORAL
  Filled 2011-12-23 (×7): qty 1

## 2011-12-23 MED ORDER — ALBUTEROL SULFATE HFA 108 (90 BASE) MCG/ACT IN AERS
2.0000 | INHALATION_SPRAY | Freq: Four times a day (QID) | RESPIRATORY_TRACT | Status: DC | PRN
  Filled 2011-12-23: qty 6.7

## 2011-12-23 MED ORDER — ONDANSETRON HCL 4 MG PO TABS: 4.0000 mg | ORAL_TABLET | Freq: Four times a day (QID) | ORAL | Status: DC | PRN

## 2011-12-23 MED ORDER — FLUTICASONE PROPIONATE HFA 44 MCG/ACT IN AERO
2.0000 | INHALATION_SPRAY | Freq: Two times a day (BID) | RESPIRATORY_TRACT | Status: DC
  Administered 2011-12-23 – 2011-12-25 (×5): 2 via RESPIRATORY_TRACT
  Filled 2011-12-23 (×2): qty 10.6

## 2011-12-23 MED ORDER — ONDANSETRON HCL 4 MG/2ML IJ SOLN: 4.0000 mg | Freq: Four times a day (QID) | INTRAMUSCULAR | Status: DC | PRN

## 2011-12-23 MED ORDER — DEXAMETHASONE SODIUM PHOSPHATE 4 MG/ML IJ SOLN
INTRAMUSCULAR | Status: DC | PRN
  Administered 2011-12-23: 4 mg via INTRAVENOUS

## 2011-12-23 MED ORDER — HYDROMORPHONE HCL PF 1 MG/ML IJ SOLN: 0.5000 mg | INTRAMUSCULAR | Status: DC | PRN

## 2011-12-23 MED ORDER — ENOXAPARIN SODIUM 30 MG/0.3ML ~~LOC~~ SOLN
30.0000 mg | Freq: Two times a day (BID) | SUBCUTANEOUS | Status: DC
  Administered 2011-12-24 – 2011-12-25 (×4): 30 mg via SUBCUTANEOUS
  Filled 2011-12-23 (×8): qty 0.3

## 2011-12-23 MED ORDER — SUFENTANIL CITRATE 50 MCG/ML IV SOLN
INTRAVENOUS | Status: DC | PRN
  Administered 2011-12-23 (×2): 5 ug via INTRAVENOUS
  Administered 2011-12-23: 20 ug via INTRAVENOUS

## 2011-12-23 MED ORDER — MORPHINE SULFATE 4 MG/ML IJ SOLN
INTRAMUSCULAR | Status: DC | PRN
  Administered 2011-12-23: 4 mg via INTRAVENOUS

## 2011-12-23 MED ORDER — METHOCARBAMOL 500 MG PO TABS
500.0000 mg | ORAL_TABLET | Freq: Four times a day (QID) | ORAL | Status: DC | PRN
  Administered 2011-12-24 – 2011-12-26 (×3): 500 mg via ORAL
  Filled 2011-12-23 (×3): qty 1

## 2011-12-23 MED ORDER — EPHEDRINE SULFATE 50 MG/ML IJ SOLN
INTRAMUSCULAR | Status: DC | PRN
  Administered 2011-12-23: 10 mg via INTRAVENOUS

## 2011-12-23 MED ORDER — METHOCARBAMOL 100 MG/ML IJ SOLN
500.0000 mg | Freq: Four times a day (QID) | INTRAVENOUS | Status: DC | PRN
  Filled 2011-12-23: qty 5

## 2011-12-23 MED ORDER — FENOFIBRATE 160 MG PO TABS
160.0000 mg | ORAL_TABLET | Freq: Every day | ORAL | Status: DC
  Administered 2011-12-23 – 2011-12-26 (×4): 160 mg via ORAL
  Filled 2011-12-23 (×4): qty 1

## 2011-12-23 MED ORDER — ONDANSETRON HCL 4 MG/2ML IJ SOLN
INTRAMUSCULAR | Status: DC | PRN
  Administered 2011-12-23: 4 mg via INTRAVENOUS

## 2011-12-23 MED ORDER — ESTROGENS CONJUGATED 0.625 MG PO TABS
0.6250 mg | ORAL_TABLET | Freq: Every day | ORAL | Status: DC
  Administered 2011-12-24 – 2011-12-26 (×3): 0.625 mg via ORAL
  Filled 2011-12-23 (×4): qty 1

## 2011-12-23 MED ORDER — PROPOFOL 10 MG/ML IV EMUL
INTRAVENOUS | Status: DC | PRN
  Administered 2011-12-23: 180 mg via INTRAVENOUS

## 2011-12-23 MED ORDER — PATIENT'S GUIDE TO USING COUMADIN BOOK
Freq: Once | Status: DC
  Filled 2011-12-23: qty 1

## 2011-12-23 MED ORDER — WARFARIN SODIUM 7.5 MG PO TABS
7.5000 mg | ORAL_TABLET | Freq: Once | ORAL | Status: AC
  Administered 2011-12-23: 7.5 mg via ORAL
  Filled 2011-12-23: qty 1

## 2011-12-23 MED ORDER — LACTATED RINGERS IV SOLN
INTRAVENOUS | Status: DC | PRN
  Administered 2011-12-23 (×2): via INTRAVENOUS

## 2011-12-23 MED ORDER — POTASSIUM CHLORIDE IN NACL 20-0.9 MEQ/L-% IV SOLN
INTRAVENOUS | Status: DC
  Administered 2011-12-23: via INTRAVENOUS
  Filled 2011-12-23 (×8): qty 1000

## 2011-12-23 MED ORDER — CLONAZEPAM 1 MG PO TABS
1.0000 mg | ORAL_TABLET | Freq: Four times a day (QID) | ORAL | Status: DC | PRN
  Administered 2011-12-24 – 2011-12-25 (×3): 1 mg via ORAL
  Filled 2011-12-23 (×4): qty 1

## 2011-12-23 MED ORDER — BUPIVACAINE HCL (PF) 0.25 % IJ SOLN
INTRAMUSCULAR | Status: DC | PRN
  Administered 2011-12-23: 30 mL

## 2011-12-23 MED ORDER — ACETAMINOPHEN 325 MG PO TABS
650.0000 mg | ORAL_TABLET | Freq: Four times a day (QID) | ORAL | Status: DC | PRN
  Filled 2011-12-23: qty 2

## 2011-12-23 MED ORDER — ACETAMINOPHEN 650 MG RE SUPP: 650.0000 mg | Freq: Four times a day (QID) | RECTAL | Status: DC | PRN

## 2011-12-23 MED ORDER — MIDAZOLAM HCL 2 MG/2ML IJ SOLN
INTRAMUSCULAR | Status: AC
  Filled 2011-12-23: qty 2

## 2011-12-23 MED ORDER — ATENOLOL 50 MG PO TABS
50.0000 mg | ORAL_TABLET | Freq: Every day | ORAL | Status: DC
  Administered 2011-12-25 – 2011-12-26 (×2): 50 mg via ORAL
  Filled 2011-12-23 (×4): qty 1

## 2011-12-23 MED ORDER — HYDROMORPHONE HCL PF 1 MG/ML IJ SOLN
0.2500 mg | INTRAMUSCULAR | Status: DC | PRN
  Administered 2011-12-23: 0.25 mg via INTRAVENOUS
  Administered 2011-12-23: 0.5 mg via INTRAVENOUS
  Administered 2011-12-23: 0.25 mg via INTRAVENOUS

## 2011-12-23 MED ORDER — LIDOCAINE HCL 4 % MT SOLN
OROMUCOSAL | Status: DC | PRN
  Administered 2011-12-23: 4 mL via TOPICAL

## 2011-12-23 MED ORDER — PHENOL 1.4 % MT LIQD
1.0000 | OROMUCOSAL | Status: DC | PRN
  Filled 2011-12-23: qty 177

## 2011-12-23 MED ORDER — FENTANYL CITRATE 0.05 MG/ML IJ SOLN
INTRAMUSCULAR | Status: AC
  Filled 2011-12-23: qty 2

## 2011-12-23 MED ORDER — SODIUM CHLORIDE 0.9 % IR SOLN
Status: DC | PRN
  Administered 2011-12-23: 3000 mL

## 2011-12-23 MED ORDER — MONTELUKAST SODIUM 10 MG PO TABS
10.0000 mg | ORAL_TABLET | Freq: Every day | ORAL | Status: DC
Start: 2011-12-23 — End: 2011-12-26
  Administered 2011-12-23 – 2011-12-25 (×3): 10 mg via ORAL
  Filled 2011-12-23 (×4): qty 1

## 2011-12-23 MED ORDER — VENLAFAXINE HCL ER 75 MG PO CP24
225.0000 mg | ORAL_CAPSULE | Freq: Every day | ORAL | Status: DC
  Administered 2011-12-23 – 2011-12-26 (×4): 225 mg via ORAL
  Filled 2011-12-23 (×4): qty 1

## 2011-12-23 MED ORDER — FENTANYL CITRATE 0.05 MG/ML IJ SOLN
50.0000 ug | INTRAMUSCULAR | Status: DC | PRN
  Administered 2011-12-23: 100 ug via INTRAVENOUS

## 2011-12-23 MED ORDER — ROSUVASTATIN CALCIUM 10 MG PO TABS
10.0000 mg | ORAL_TABLET | Freq: Every day | ORAL | Status: DC
  Administered 2011-12-23 – 2011-12-25 (×3): 10 mg via ORAL
  Filled 2011-12-23 (×4): qty 1

## 2011-12-23 MED ORDER — CEFAZOLIN SODIUM 1-5 GM-% IV SOLN
1.0000 g | Freq: Three times a day (TID) | INTRAVENOUS | Status: AC
  Administered 2011-12-23 – 2011-12-24 (×3): 1 g via INTRAVENOUS
  Filled 2011-12-23 (×3): qty 50

## 2011-12-23 MED ORDER — DOCUSATE SODIUM 100 MG PO CAPS
100.0000 mg | ORAL_CAPSULE | Freq: Two times a day (BID) | ORAL | Status: DC
  Administered 2011-12-23 – 2011-12-26 (×6): 100 mg via ORAL
  Filled 2011-12-23 (×7): qty 1

## 2011-12-23 MED ORDER — WARFARIN VIDEO: Freq: Once | Status: DC

## 2011-12-23 MED ORDER — OLANZAPINE 5 MG PO TABS
5.0000 mg | ORAL_TABLET | Freq: Two times a day (BID) | ORAL | Status: DC | PRN
  Administered 2011-12-24: 5 mg via ORAL
  Filled 2011-12-23 (×2): qty 1

## 2011-12-23 MED ORDER — SENNOSIDES-DOCUSATE SODIUM 8.6-50 MG PO TABS: 1.0000 | ORAL_TABLET | Freq: Every evening | ORAL | Status: DC | PRN

## 2011-12-23 MED ORDER — OXYCODONE-ACETAMINOPHEN 5-325 MG PO TABS
1.0000 | ORAL_TABLET | ORAL | Status: DC | PRN
  Administered 2011-12-23 – 2011-12-24 (×6): 2 via ORAL
  Administered 2011-12-25 (×2): 1 via ORAL
  Administered 2011-12-25 (×3): 2 via ORAL
  Administered 2011-12-25 – 2011-12-26 (×4): 1 via ORAL
  Filled 2011-12-23: qty 1
  Filled 2011-12-23 (×3): qty 2
  Filled 2011-12-23 (×2): qty 1
  Filled 2011-12-23 (×3): qty 2
  Filled 2011-12-23: qty 1
  Filled 2011-12-23 (×2): qty 2
  Filled 2011-12-23 (×2): qty 1
  Filled 2011-12-23: qty 2

## 2011-12-23 MED ORDER — MIDAZOLAM HCL 2 MG/2ML IJ SOLN
1.0000 mg | INTRAMUSCULAR | Status: DC | PRN
  Administered 2011-12-23: 2 mg via INTRAVENOUS

## 2011-12-23 MED ORDER — POTASSIUM CHLORIDE IN NACL 20-0.9 MEQ/L-% IV SOLN
INTRAVENOUS | Status: DC
  Filled 2011-12-23 (×2): qty 1000

## 2011-12-23 MED ORDER — FLUTICASONE PROPIONATE 50 MCG/ACT NA SUSP
2.0000 | Freq: Every day | NASAL | Status: DC
  Administered 2011-12-24 – 2011-12-25 (×3): 2 via NASAL
  Filled 2011-12-23: qty 16

## 2011-12-23 MED ORDER — BUPIVACAINE-EPINEPHRINE PF 0.5-1:200000 % IJ SOLN
INTRAMUSCULAR | Status: DC | PRN
  Administered 2011-12-23: 30 mL

## 2011-12-23 MED ORDER — LORATADINE 10 MG PO TABS
10.0000 mg | ORAL_TABLET | Freq: Every day | ORAL | Status: DC
  Administered 2011-12-23 – 2011-12-26 (×4): 10 mg via ORAL
  Filled 2011-12-23 (×4): qty 1

## 2011-12-23 MED ORDER — BISACODYL 10 MG RE SUPP
10.0000 mg | Freq: Every day | RECTAL | Status: DC | PRN
  Administered 2011-12-25: 10 mg via RECTAL
  Filled 2011-12-23: qty 1

## 2011-12-23 MED ORDER — ROCURONIUM BROMIDE 100 MG/10ML IV SOLN
INTRAVENOUS | Status: DC | PRN
  Administered 2011-12-23: 50 mg via INTRAVENOUS

## 2011-12-23 SURGICAL SUPPLY — 55 items
BANDAGE ESMARK 6X9 LF (GAUZE/BANDAGES/DRESSINGS) ×1 IMPLANT
BLADE SAG 18X100X1.27 (BLADE) ×4 IMPLANT
BNDG ESMARK 6X9 LF (GAUZE/BANDAGES/DRESSINGS) ×2
BOOTCOVER CLEANROOM LRG (PROTECTIVE WEAR) ×4 IMPLANT
BOWL SMART MIX CTS (DISPOSABLE) ×2 IMPLANT
CEMENT BONE SIMPLEX SPEEDSET (Cement) ×2 IMPLANT
CLOTH BEACON ORANGE TIMEOUT ST (SAFETY) ×2 IMPLANT
COVER BACK TABLE 24X17X13 BIG (DRAPES) ×2 IMPLANT
COVER SURGICAL LIGHT HANDLE (MISCELLANEOUS) ×2 IMPLANT
CUFF TOURNIQUET SINGLE 34IN LL (TOURNIQUET CUFF) ×2 IMPLANT
DRAPE EXTREMITY T 121X128X90 (DRAPE) ×2 IMPLANT
DRAPE PROXIMA HALF (DRAPES) ×2 IMPLANT
DRAPE U-SHAPE 47X51 STRL (DRAPES) ×2 IMPLANT
DRSG PAD ABDOMINAL 8X10 ST (GAUZE/BANDAGES/DRESSINGS) ×2 IMPLANT
DURAPREP 26ML APPLICATOR (WOUND CARE) ×2 IMPLANT
ELECT CAUTERY BLADE 6.4 (BLADE) ×2 IMPLANT
ELECT REM PT RETURN 9FT ADLT (ELECTROSURGICAL) ×2
ELECTRODE REM PT RTRN 9FT ADLT (ELECTROSURGICAL) ×1 IMPLANT
EVACUATOR 1/8 PVC DRAIN (DRAIN) ×2 IMPLANT
FACESHIELD LNG OPTICON STERILE (SAFETY) ×2 IMPLANT
GAUZE XEROFORM 5X9 LF (GAUZE/BANDAGES/DRESSINGS) ×2 IMPLANT
GLOVE BIOGEL PI IND STRL 8 (GLOVE) ×1 IMPLANT
GLOVE BIOGEL PI INDICATOR 8 (GLOVE) ×1
GOWN PREVENTION PLUS XLARGE (GOWN DISPOSABLE) ×4 IMPLANT
GOWN STRL NON-REIN LRG LVL3 (GOWN DISPOSABLE) ×4 IMPLANT
GOWN STRL REIN 2XL XLG LVL4 (GOWN DISPOSABLE) ×2 IMPLANT
HANDPIECE INTERPULSE COAX TIP (DISPOSABLE) ×1
IMMOBILIZER KNEE 22 UNIV (SOFTGOODS) ×2 IMPLANT
IMMOBILIZER KNEE 24 THIGH 36 (MISCELLANEOUS) IMPLANT
IMMOBILIZER KNEE 24 UNIV (MISCELLANEOUS)
KIT BASIN OR (CUSTOM PROCEDURE TRAY) ×2 IMPLANT
KIT ROOM TURNOVER OR (KITS) ×2 IMPLANT
MANIFOLD NEPTUNE II (INSTRUMENTS) ×2 IMPLANT
NS IRRIG 1000ML POUR BTL (IV SOLUTION) ×2 IMPLANT
PACK TOTAL JOINT (CUSTOM PROCEDURE TRAY) ×2 IMPLANT
PAD ARMBOARD 7.5X6 YLW CONV (MISCELLANEOUS) ×4 IMPLANT
PAD CAST 4YDX4 CTTN HI CHSV (CAST SUPPLIES) ×1 IMPLANT
PADDING CAST COTTON 4X4 STRL (CAST SUPPLIES) ×1
PADDING CAST COTTON 6X4 STRL (CAST SUPPLIES) ×2 IMPLANT
PADDING WEBRIL 4 STERILE (GAUZE/BANDAGES/DRESSINGS) ×2 IMPLANT
RUBBERBAND STERILE (MISCELLANEOUS) ×2 IMPLANT
SET HNDPC FAN SPRY TIP SCT (DISPOSABLE) ×1 IMPLANT
SPONGE GAUZE 4X4 12PLY (GAUZE/BANDAGES/DRESSINGS) ×2 IMPLANT
STAPLER VISISTAT 35W (STAPLE) ×2 IMPLANT
SUCTION FRAZIER TIP 10 FR DISP (SUCTIONS) ×2 IMPLANT
SUT VIC AB 1 CTX 36 (SUTURE) ×2
SUT VIC AB 1 CTX36XBRD ANBCTR (SUTURE) ×2 IMPLANT
SUT VIC AB 2-0 CT1 27 (SUTURE) ×2
SUT VIC AB 2-0 CT1 TAPERPNT 27 (SUTURE) ×2 IMPLANT
SYR 30ML LL (SYRINGE) ×2 IMPLANT
SYR 30ML SLIP (SYRINGE) ×2 IMPLANT
TOWEL OR 17X24 6PK STRL BLUE (TOWEL DISPOSABLE) ×2 IMPLANT
TOWEL OR 17X26 10 PK STRL BLUE (TOWEL DISPOSABLE) ×2 IMPLANT
TRAY FOLEY CATH 14FR (SET/KITS/TRAYS/PACK) ×2 IMPLANT
WATER STERILE IRR 1000ML POUR (IV SOLUTION) ×6 IMPLANT

## 2011-12-23 NOTE — Transfer of Care (Signed)
Immediate Anesthesia Transfer of Care Note  Patient: [name]  Procedure(s) Performed: Procedure(s) (LRB): TOTAL KNEE ARTHROPLASTY (Right)  Patient Location: PACU  Anesthesia Type: General and GA combined with regional for post-op pain  Level of Consciousness: alert , oriented, patient cooperative and responds to stimulation  Airway & Oxygen Therapy: Patient Spontanous Breathing and Patient connected to nasal cannula oxygen  Post-op Assessment: Report given to PACU RN, Post -op Vital signs reviewed and stable and Patient moving all extremities  Post vital signs: Reviewed and stable  Complications: No apparent anesthesia complications

## 2011-12-23 NOTE — Anesthesia Postprocedure Evaluation (Signed)
Anesthesia Post Note  Patient: [name]  Procedure(s) Performed: Procedure(s) (LRB): TOTAL KNEE ARTHROPLASTY (Right)  Anesthesia type: general  Patient location: PACU  Post pain: Pain level controlled  Post assessment: Patient's Cardiovascular Status Stable  Last Vitals:  Filed Vitals:   12/23/11 1530  BP:   Pulse:   Temp: 36.6 C  Resp:     Post vital signs: Reviewed and stable  Level of consciousness: sedated  Complications: No apparent anesthesia complications

## 2011-12-23 NOTE — Anesthesia Procedure Notes (Signed)
Anesthesia Regional Block:  Femoral nerve block  Pre-Anesthetic Checklist: ,, timeout performed, Correct Patient, Correct Site, Correct Laterality, Correct Procedure,, site marked, risks and benefits discussed, Surgical consent,  Pre-op evaluation,  At surgeon's request and post-op pain management  Laterality: Right  Prep: chloraprep       Needles:  Injection technique: Single-shot  Needle Type: Echogenic Stimulator Needle     Needle Length: 9cm  Needle Gauge: 21    Additional Needles:  Procedures: nerve stimulator Femoral nerve block  Nerve Stimulator or Paresthesia:  Response: Quadriceps muscle contraction, 0.45 mA,   Additional Responses:   Narrative:  Start time: 12/23/2011 10:10 AM End time: 12/23/2011 10:24 AM Injection made incrementally with aspirations every 5 mL.  Performed by: Personally  Anesthesiologist: Dr Chaney Malling  Additional Notes: Functioning IV was confirmed and monitors were applied.  A 90mm 21ga Arrow echogenic stimulator needle was used. Sterile prep and drape,hand hygiene and sterile gloves were used.  Negative aspiration and negative test dose prior to incremental administration of local anesthetic. The patient tolerated the procedure well.    Femoral nerve block

## 2011-12-23 NOTE — Anesthesia Preprocedure Evaluation (Signed)
Anesthesia Evaluation  Patient identified by MRN, date of birth, ID band Patient awake    Reviewed: Allergy & Precautions, H&P , NPO status , Patient's Chart, lab work & pertinent test results  Airway Mallampati: II  Neck ROM: full    Dental   Pulmonary asthma ,          Cardiovascular     Neuro/Psych PSYCHIATRIC DISORDERS Depression    GI/Hepatic   Endo/Other    Renal/GU      Musculoskeletal   Abdominal   Peds  Hematology   Anesthesia Other Findings   Reproductive/Obstetrics                           Anesthesia Physical Anesthesia Plan  ASA: II  Anesthesia Plan: General and Regional   Post-op Pain Management: MAC Combined w/ Regional for Post-op pain   Induction: Intravenous  Airway Management Planned:   Additional Equipment:   Intra-op Plan:   Post-operative Plan: Extubation in OR  Informed Consent: I have reviewed the patients History and Physical, chart, labs and discussed the procedure including the risks, benefits and alternatives for the proposed anesthesia with the patient or authorized representative who has indicated his/her understanding and acceptance.     Plan Discussed with: CRNA and Surgeon  Anesthesia Plan Comments:         Anesthesia Quick Evaluation

## 2011-12-23 NOTE — Progress Notes (Signed)
ANTICOAGULATION CONSULT NOTE - Initial Consult  Pharmacy Consult for Coumadin Indication: VTE prophylaxis  Allergies  Allergen Reactions  . Macrobid Nausea Only    Severe headache  . Alprazolam     Patient Measurements: Height: 5' 8.9" (175 cm) Weight: 216 lb 7.9 oz (98.2 kg) IBW/kg (Calculated) : 65.97   Vital Signs: Temp: 97.9 F (36.6 C) (02/13 1530) Temp src: Oral (02/13 0907) BP: 115/64 mmHg (02/13 1415) Pulse Rate: 59  (02/13 1022)  Labs: No results found for this basename: HGB:2,HCT:3,PLT:3,APTT:3,LABPROT:3,INR:3,HEPARINUNFRC:3,CREATININE:3,CKTOTAL:3,CKMB:3,TROPONINI:3 in the last 72 hours Estimated Creatinine Clearance: 97.7 ml/min (by C-G formula based on Cr of 0.82).  Medical History: Past Medical History  Diagnosis Date  . Bipolar disorder   . MVP (mitral valve prolapse)   . Hyperlipidemia   . Asthma   . Allergic rhinitis   . Arthritis     R knee, Left Knee  . Urinary tract infection     frequent, see Urololgist 12/16/2010  . Dysrhythmia     Palpations occ, takes Tenormin  . Constipation     Medications:  Scheduled:    . atenolol  50 mg Oral Daily  .  ceFAZolin (ANCEF) IV  1 g Intravenous Q8H  .  ceFAZolin (ANCEF) IV  2 g Intravenous 60 min Pre-Op  . docusate sodium  100 mg Oral BID  . enoxaparin  30 mg Subcutaneous Q12H  . estrogens (conjugated)  0.625 mg Oral Daily  . fenofibrate  160 mg Oral Daily  . fluticasone  2 spray Each Nare Daily  . fluticasone  2 puff Inhalation BID  . lamoTRIgine  200 mg Oral BID  . loratadine  10 mg Oral Daily  . montelukast  10 mg Oral QHS  . QUEtiapine  450 mg Oral QHS  . rosuvastatin  10 mg Oral QHS  . venlafaxine  225 mg Oral Daily    Assessment: 55 YOF s/p right TKA to start coumadin for post-op VTE prophylaxis. Patient does not take anticoagulants prior to surgery, baseline INR 1.06 (2/6), LFTs wnl. Patient is also on lovenox 30mg  sq q12hrs.   Goal of Therapy:  INR 2-3   Plan:  1. Coumadin 7.5 mg po x  1 2. F/u daily INR 3. Coumadin education book and video 4. D/c lovenox when INR > 2  Riki Rusk 12/23/2011,4:55 PM

## 2011-12-23 NOTE — Interval H&P Note (Signed)
History and Physical Interval Note:  12/23/2011 8:37 AM  [name]  has presented today for surgery, with the diagnosis of DJD RIGHT KNEE  The various methods of treatment have been discussed with the patient and family. After consideration of risks, benefits and other options for treatment, the patient has consented to  Procedure(s) (LRB): TOTAL KNEE ARTHROPLASTY (Right) as a surgical intervention .  The patients' history has been reviewed, patient examined, no change in status, stable for surgery.  I have reviewed the patients' chart and labs.  Questions were answered to the patient's satisfaction.     [name] F

## 2011-12-23 NOTE — Progress Notes (Signed)
Orthopedic Tech Progress Note Patient Details:  [name] Oct 16, 1956 [id number]  CPM Right Knee CPM Right Knee: On Right Knee Flexion (Degrees): 60  Right Knee Extension (Degrees): 0  Trapeze bar patient helper;viewed order from rn order list  Nikki Dom 12/23/2011, 1:56 PM

## 2011-12-23 NOTE — Brief Op Note (Signed)
12/23/2011  12:43 PM  PATIENT:  [name]  56 y.o. female  PRE-OPERATIVE DIAGNOSIS:  DJD RIGHT KNEE  POST-OPERATIVE DIAGNOSIS:  DJD RIGHT KNEE  PROCEDURE:  Procedure(s) (LRB): TOTAL KNEE ARTHROPLASTY (Right)  SURGEON:  Surgeon(s) and Role:    * Loreta Ave, MD - Primary  PHYSICIAN ASSISTANT: Zonia Kief M  ANESTHESIA:   general  EBL:  Total I/O In: 1000 [I.V.:1000] Out: -   SPECIMEN:  No Specimen  DISPOSITION OF SPECIMEN:  N/A  TOURNIQUET:   Total Tourniquet Time Documented: Thigh (Right) - 73 minutes   PATIENT DISPOSITION:  PACU - hemodynamically stable.

## 2011-12-24 ENCOUNTER — Encounter (HOSPITAL_COMMUNITY): Payer: Self-pay | Admitting: Orthopedic Surgery

## 2011-12-24 LAB — BASIC METABOLIC PANEL
Chloride: 104 mEq/L (ref 96–112)
Creatinine, Ser: 0.71 mg/dL (ref 0.50–1.10)
GFR calc Af Amer: >90 mL/min (ref >90)
Sodium: 140 mEq/L (ref 135–145)

## 2011-12-24 LAB — CBC
HCT: 33.1 % — ABNORMAL LOW (ref 36.0–46.0)
Hemoglobin: 11 g/dL — ABNORMAL LOW (ref 12.0–15.0)
MCH: 30.6 pg (ref 26.0–34.0)
Platelets: 238 10*3/uL (ref 150–400)
RDW: 12.9 % (ref 11.5–15.5)

## 2011-12-24 MED ORDER — DIPHENHYDRAMINE HCL 25 MG PO CAPS: 25.0000 mg | ORAL_CAPSULE | Freq: Every evening | ORAL | Status: DC | PRN

## 2011-12-24 MED ORDER — WARFARIN SODIUM 7.5 MG PO TABS
7.5000 mg | ORAL_TABLET | Freq: Once | ORAL | Status: AC
  Administered 2011-12-24: 7.5 mg via ORAL
  Filled 2011-12-24: qty 1

## 2011-12-24 NOTE — Op Note (Signed)
NAMESHALANE, FLORENDO                 ACCOUNT NO.:  [id number]  MEDICAL RECORD NO.:  [id number]  LOCATION:  5009                         FACILITY:  [facility]  PHYSICIAN:  Loreta Ave, M.D. DATE OF BIRTH:  1956-05-25  DATE OF PROCEDURE:  12/23/2011 DATE OF DISCHARGE:                              OPERATIVE REPORT   PREOPERATIVE DIAGNOSIS:  Right knee end-stage degenerative arthritis, varus alignment.  POSTOPERATIVE DIAGNOSIS:  Right knee end-stage degenerative arthritis, varus alignment.  PROCEDURE:  Right modified minimally invasive total knee replacement Stryker Triathlon prosthesis.  Soft tissue balancing.  Cemented pegged posterior stabilized #4 femoral component.  Cemented #4 tibial component, 9-mm polyethylene insert.  Cemented resurfacing 32-mm patellar component.  SURGEON:  Loreta Ave, MD  ASSISTANT:  Zonia Kief, PA present throughout the entire case, necessary for timely completion of procedure.  ANESTHESIA:  General.  BLOOD LOSS:  Minimal.  SPECIMENS:  None.  CULTURES:  None.  COMPLICATIONS:  None.  DRESSINGS:  Soft compressive knee immobilizer.  TOURNIQUET TIME:  1 hour.  DRAIN:  Hemovac x1.  PROCEDURE:  The patient was brought to the operating room, placed on the operating table in supine position.  After adequate anesthesia had been obtained, knee examined.  Varus alignment, which was correctable. Extension and flexion 100 degrees.  Tourniquet applied and prepped and draped in usual sterile fashion.  Exsanguinated with elevation of Esmarch,  tourniquet inflated to 350 mmHg.  Straight incision above the patella down to tibial tubercle.  Medial arthrotomy, vastus splitting, preserving quad tendon.  Knee exposed.  Marked grade 4 changes medially, lesser extent other compartments.  Remnants of menisci, cruciate ligaments, periarticular spurs removed.  Distal femur exposed. Intramedullary guide placed.  A 10-mm resection 5 -degrees of valgus. Using  epicondylar axis, the femur was sized, cut, and fitted for posterior stabilized #4 component.  Proximal tibial resection below the defect medially.  Size #4 component.  Debris cleared throughout the knee including posterior recess.  Patella exposed.  Posterior 10 mm removed. Drilled, sized, and fitted for a 32-mm component.  Trials put in place. #4 above and below, 9-mm insert, 32 patella.  With this construct, excellent motion, full extension, full flexion, good patellofemoral tracking.  Balance in flexion and extension, good biomechanical axis. Tibia was marked for rotation and hand reamed.  All trials removed. Copious irrigation with a pulse irrigating device.  Cement prepared, placed on all components, firmly seated.  Polyethylene attached to tibia, knee reduced.  Patella held with a clamp.  Once the cement hardened, the knee was reexamined.  Nicely balanced in flexion and extension.  Good mechanical axis, good tracking.  Good stability. Arthrotomy closed with #1 Vicryl.  Skin and subcutaneous tissue with Vicryl staples.  A Hemovac had been placed through a separate stab wound.  After wound was closed, sterile compressive dressing applied. Tourniquet deflated and removed.  Knee immobilizer applied.  Anesthesia reversed.  Brought to the recovery room.  Tolerated surgery well.  No complications.     Loreta Ave, M.D.     DFM/MEDQ  D:  12/23/2011  T:  12/24/2011  Job:  [id number]

## 2011-12-24 NOTE — Progress Notes (Signed)
Subjective: Doing well.  Pain controlled.  Some feeling of anxiety last night.   Objective: Vital signs in last 24 hours: Temp:  [97 F (36.1 C)-98.5 F (36.9 C)] 98 F (36.7 C) (02/14 0457) Pulse Rate:  [56-67] 67  (02/14 0457) Resp:  [10-18] 18  (02/14 0457) BP: (98-115)/(56-66) 111/62 mmHg (02/14 0457) SpO2:  [95 %-100 %] 99 % (02/14 0457) Weight:  [98.2 kg (216 lb 7.9 oz)] 98.2 kg (216 lb 7.9 oz) (02/13 1416)  Intake/Output from previous day: 02/13 0701 - 02/14 0700 In: 1700 [I.V.:1700] Out: 2225 [Urine:2000; Drains:225] Intake/Output this shift:     Basename 12/24/11 0516  HGB 11.0*    Basename 12/24/11 0516  WBC 7.2  RBC 3.59*  HCT 33.1*  PLT 238    Basename 12/24/11 0516  NA 140  K 4.4  CL 104  CO2 25  BUN 15  CREATININE 0.71  GLUCOSE 117*  CALCIUM 9.1    Basename 12/24/11 0516  LABPT --  INR 1.09    Neurovascular intact, dressing c/d/i.    Assessment/Plan: PT today.   Anticipate d/c home tomorrow.   Benadryl qhs prn sleep.   [name] M 12/24/2011, 8:36 AM

## 2011-12-24 NOTE — Progress Notes (Signed)
ANTICOAGULATION CONSULT NOTE - Initial Consult  Pharmacy Consult for Coumadin Indication: VTE prophylaxis s/p R TKA  Allergies  Allergen Reactions  . Macrobid Nausea Only    Severe headache  . Alprazolam     Patient Measurements: Height: 5' 8.9" (175 cm) Weight: 216 lb 7.9 oz (98.2 kg) IBW/kg (Calculated) : 65.97   Vital Signs: Temp: 98 F (36.7 C) (02/14 1312) Temp src: Oral (02/14 0457) BP: 91/49 mmHg (02/14 1312) Pulse Rate: 80  (02/14 1312)  Labs:  [facility] 12/24/11 0516  HGB 11.0*  HCT 33.1*  PLT 238  APTT --  LABPROT 14.3  INR 1.09  HEPARINUNFRC --  CREATININE 0.71  CKTOTAL --  CKMB --  TROPONINI --   Estimated Creatinine Clearance: 100.1 ml/min (by C-G formula based on Cr of 0.71).  Assessment: 50 YOF s/p right TKA to start coumadin for post-op VTE prophylaxis. INR with no movement past first dose of coumadin. Patient is also on lovenox 30mg  sq q12hrs. No bleeding noted.   Goal of Therapy:  INR 2-3   Plan:  1. Coumadin 7.5 mg po x 1 2. F/u daily INR 3. D/c lovenox when INR > 2  Daralyn Bert, Hilario Quarry, PharmD, BCPS Clinical pharmacist, pager [phone] 12/24/2011,2:52 PM

## 2011-12-24 NOTE — Progress Notes (Signed)
Physical Therapy Evaluation Patient Details Name: [name] MRN: [id number] DOB: 04/28/1956 Today's Date: 12/24/2011  Problem List:  Patient Active Problem List  Diagnoses  . CANDIDIASIS  . VITAMIN D DEFICIENCY  . HYPERLIPIDEMIA  . BIPOLAR AFFECTIVE DISORDER  . DEPRESSIVE DISORDER  . ADD  . RHINITIS  . VAGINITIS  . HOT FLASHES  . MUSCLE WEAKNESS (GENERALIZED)  . FATIGUE  . MUSCULOSKELETAL PAIN  . POLYDIPSIA  . COUGH  . Routine gynecological examination  . Dizziness  . Vaginal odor  . UTI (lower urinary tract infection)  . Knee pain  . Hyperglycemia  . Pre-op exam    Past Medical History:  Past Medical History  Diagnosis Date  . Bipolar disorder   . MVP (mitral valve prolapse)   . Hyperlipidemia   . Asthma   . Allergic rhinitis   . Arthritis     R knee, Left Knee  . Urinary tract infection     frequent, see Urololgist 12/16/2010  . Dysrhythmia     Palpations occ, takes Tenormin  . Constipation    Past Surgical History:  Past Surgical History  Procedure Date  . Nasal sinus surgery 01/2010    x2  . Back surgery 2001    L5 - S1  . Abdominal hysterectomy 2002    no oophorectomy  . Tonsillectomy     PT Assessment/Plan/Recommendation PT Assessment Clinical Impression Statement: Patient s/p R TKA presenting with decreased R LE strength and active/passive R knee ROM. Patient to benefit from skilled PT both while in hospital and upon d/c for maximal functional recovery. PT Recommendation/Assessment: Patient will need skilled PT in the acute care venue PT Problem List: Decreased strength;Decreased range of motion;Decreased activity tolerance;Decreased balance;Decreased coordination PT Therapy Diagnosis : Difficulty walking;Abnormality of gait;Generalized weakness;Acute pain PT Plan PT Frequency: 7X/week PT Treatment/Interventions: DME instruction;Gait training;Stair training;Functional mobility training;Therapeutic activities;Therapeutic exercise PT  Recommendation Follow Up Recommendations: Home health PT;Supervision/Assistance - 24 hour Equipment Recommended: None recommended by PT (pt has all DME recommended) PT Goals  Acute Rehab PT Goals PT Goal Formulation: With patient Time For Goal Achievement: 7 days Pt will go Supine/Side to Sit: with modified independence;with HOB 0 degrees PT Goal: Supine/Side to Sit - Progress: Goal set today Pt will go Sit to Stand: with modified independence PT Goal: Sit to Stand - Progress: Goal set today Pt will Ambulate: >150 feet;with supervision;with rolling walker PT Goal: Ambulate - Progress: Goal set today Pt will Go Up / Down Stairs: 1-2 stairs;with min assist;with rolling walker PT Goal: Up/Down Stairs - Progress: Goal set today Pt will Perform Home Exercise Program: Independently PT Goal: Perform Home Exercise Program - Progress: Goal set today  PT Evaluation Precautions/Restrictions  Precautions Precautions: Knee Required Braces or Orthoses: Yes Knee Immobilizer: On when out of bed or walking (R LE) Restrictions Weight Bearing Restrictions: Yes RLE Weight Bearing: Weight bearing as tolerated Prior Functioning  Home Living Lives With: Alone (family and caregiver to provide 24/7 assist upon d/c) Receives Help From: Home health;Family;Friend(s) Type of Home: House Home Layout: One level Home Access: Stairs to enter Entrance Stairs-Rails:  (post on R side) Entrance Stairs-Number of Steps: 1 Bathroom Shower/Tub: Forensic scientist: Handicapped height Bathroom Accessibility: Yes How Accessible: Accessible via walker Home Adaptive Equipment: Bedside commode/3-in-1;Walker - rolling;Tub transfer bench Prior Function Level of Independence: Independent with basic ADLs;Independent with homemaking with ambulation;Independent with homemaking with wheelchair;Independent with gait;Independent with transfers Able to Take Stairs?: Yes Driving: Yes Vocation:  Retired IT consultant  Cognition Arousal/Alertness: Awake/alert Overall Cognitive Status: Appears within functional limits for tasks assessed Orientation Level: Oriented X4 Sensation/Coordination Sensation Light Touch: Appears Intact  Pain: 6/10  R knee  Extremity Assessment RUE Assessment RUE Assessment: Within Functional Limits LUE Assessment LUE Assessment: Within Functional Limits RLE Assessment RLE Assessment: Not tested (due to pt s/p R TKA) LLE Assessment LLE Assessment: Within Functional Limits Mobility (including Balance) Bed Mobility Bed Mobility: Yes Supine to Sit: 4: Min assist Supine to Sit Details (indicate cue type and reason): verbal cues for technique, mina for R LE management Transfers Transfers: Yes Sit to Stand: 4: Min assist;From bed Sit to Stand Details (indicate cue type and reason): verbal cues for handplacement and safety with use of RW Ambulation/Gait Ambulation/Gait: Yes Ambulation/Gait Assistance: 4: Min assist Ambulation/Gait Assistance Details (indicate cue type and reason): pt with increased anxiety due to feeling R LE weakness limitng ambulation distance at this time Ambulation Distance (Feet): 5 Feet Assistive device: Rolling walker Gait Pattern: Step-to pattern;Decreased step length - right;Decreased stance time - right Gait velocity: decreased cadence Stairs: No Wheelchair Mobility Wheelchair Mobility: No  Posture/Postural Control Posture/Postural Control: No significant limitations Balance Balance Assessed: No Exercise  Total Joint Exercises Ankle Circles/Pumps: AROM;Both;10 reps;Supine Quad Sets: AROM;Right;10 reps;Supine Heel Slides: AAROM;Right;10 reps;Supine End of Session PT - End of Session Equipment Utilized During Treatment: Gait belt;Right knee immobilizer Activity Tolerance: Patient tolerated treatment well Patient left: in chair;with call bell in reach Nurse Communication: Mobility status for transfers;Mobility status for  ambulation General Behavior During Session: [facility] for tasks performed (however had mild anxiety/nerves) Cognition: WFL for tasks performed  Marcene Brawn 12/24/2011, 11:31 AM  Lewis Shock, PT, DPT Pager #: [phone] Office #: [phone]

## 2011-12-24 NOTE — Progress Notes (Signed)
UR COMPLETED  

## 2011-12-24 NOTE — Progress Notes (Signed)
Orthopedic Tech Progress Note Patient Details:  [name] [birth year] [id number]  Patient ID: [name], female   DOB: 1956-02-06, 56 y.o.   MRN: [id number] Changed out cpm and increased flexion by ten 0-70 degrees.  [name] 12/24/2011, 6:14 PM

## 2011-12-24 NOTE — Progress Notes (Signed)
PT TREATMENT NOTE   12/24/11 1341  PT Visit Information  Last PT Received On 12/24/11  Precautions  Precautions Knee  Knee Immobilizer On when out of bed or walking  Restrictions  RLE Weight Bearing WBAT  Bed Mobility  Supine to Sit 4: Min assist;HOB elevated (Comment degrees)  Supine to Sit Details (indicate cue type and reason) assist for R LE managment  Transfers  Sit to Stand 4: Min assist;From bed  Sit to Stand Details (indicate cue type and reason) verbal cues for hand placement  Ambulation/Gait  Ambulation/Gait Assistance 4: Min assist (contact guard)  Ambulation/Gait Assistance Details (indicate cue type and reason) patient with improved sequencing with min verbal cues  Ambulation Distance (Feet) 50 Feet  Assistive device Rolling walker  Gait Pattern Step-to pattern;Decreased step length - right;Decreased stance time - right  Gait velocity decreased  Stairs No  Wheelchair Mobility  Wheelchair Mobility No  Posture/Postural Control  Posture/Postural Control No significant limitations  Balance  Balance Assessed No  Total Joint Exercises  Ankle Circles/Pumps AROM;Both;15 reps;Supine  Quad Sets AROM;Right;10 reps;Supine  Heel Slides AROM;10 reps;Right;Supine  PT - End of Session  Equipment Utilized During Treatment Gait belt;Right knee immobilizer  Activity Tolerance Patient tolerated treatment well  Patient left in chair;with call bell in reach;with family/visitor present  Nurse Communication Mobility status for ambulation  General  Behavior During Session [facility] for tasks performed  Cognition [facility] for tasks performed  PT - Assessment/Plan  Comments on Treatment Session Patient with improved ambulation endurance. Patient remains to have decreased quad strength and active R knee ROM however has improved since AM.  PT Plan Discharge plan remains appropriate;Frequency remains appropriate  PT Frequency 7X/week  Follow Up Recommendations Home health PT;Supervision/Assistance  - 24 hour  Equipment Recommended None recommended by PT  Acute Rehab PT Goals  PT Goal: Supine/Side to Sit - Progress Progressing toward goal  PT Goal: Sit to Stand - Progress Progressing toward goal  PT Goal: Ambulate - Progress Progressing toward goal  PT Goal: Up/Down Stairs - Progress Progressing toward goal  PT Goal: Perform Home Exercise Program - Progress Progressing toward goal     Lewis Shock, PT, DPT Pager #: [phone] Office #: [phone]

## 2011-12-25 LAB — BASIC METABOLIC PANEL WITH GFR
BUN: 11 mg/dL (ref 6–23)
CO2: 26 meq/L (ref 19–32)
Calcium: 8.8 mg/dL (ref 8.4–10.5)
Chloride: 102 meq/L (ref 96–112)
Creatinine, Ser: 0.63 mg/dL (ref 0.50–1.10)
GFR calc Af Amer: >90 mL/min
GFR calc non Af Amer: >90 mL/min
Glucose, Bld: 109 mg/dL — ABNORMAL HIGH (ref 70–99)
Potassium: 3.4 meq/L — ABNORMAL LOW (ref 3.5–5.1)
Sodium: 137 meq/L (ref 135–145)

## 2011-12-25 LAB — CBC
MCH: 30.9 pg (ref 26.0–34.0)
MCHC: 33.7 g/dL (ref 30.0–36.0)
Platelets: 205 10*3/uL (ref 150–400)
RBC: 3.2 MIL/uL — ABNORMAL LOW (ref 3.87–5.11)

## 2011-12-25 LAB — PROTIME-INR
INR: 1.67 — ABNORMAL HIGH (ref 0.00–1.49)
Prothrombin Time: 20 s — ABNORMAL HIGH (ref 11.6–15.2)

## 2011-12-25 MED ORDER — DIPHENHYDRAMINE HCL 25 MG PO CAPS: 25.0000 mg | ORAL_CAPSULE | Freq: Every evening | ORAL | Status: DC | PRN

## 2011-12-25 MED ORDER — WARFARIN SODIUM 5 MG PO TABS
5.0000 mg | ORAL_TABLET | Freq: Once | ORAL | Status: AC
  Administered 2011-12-25: 5 mg via ORAL
  Filled 2011-12-25: qty 1

## 2011-12-25 NOTE — Progress Notes (Signed)
Physical Therapy Treatment Note   12/25/11 1100  PT Visit Information  Last PT Received On 12/25/11  Precautions  Precautions Knee  Restrictions  RLE Weight Bearing WBAT  Transfers  Sit to Stand 4: Min assist (contact guard)  Sit to Stand Details (indicate cue type and reason) min verbal cues for hand placement reminder due to lethargy from pain meds  Ambulation/Gait  Ambulation/Gait Assistance 4: Min assist (contact guard)  Ambulation/Gait Assistance Details (indicate cue type and reason) increased time due to increased lethargy  Ambulation Distance (Feet) 75 Feet  Assistive device Rolling walker  Gait Pattern Step-to pattern;Decreased step length - right;Decreased stance time - right  Gait velocity decreased  Stairs Yes  Stairs Assistance 4: Min assist (contact guard)  Stairs Assistance Details (indicate cue type and reason) cousin Tammy present for education and assist patient  Stair Management Technique No rails;With walker;Backwards;Forwards (trialed both ways)  Number of Stairs 1  (x2)  Height of Stairs 4  (to mimic home set up)  Wheelchair Mobility  Wheelchair Mobility No  Total Joint Exercises  Ankle Circles/Pumps AROM;Right;10 reps;Seated (with LEs elevated)  Quad Sets AROM;Right;10 reps;Seated (with LEs elevated)  Short Arc Quad AROM;Right;10 reps;Seated (with LEs elevated)  Knee Flexion AROM;Right;10 reps;Seated (with LEs elevated)  PT - End of Session  Equipment Utilized During Treatment Gait belt  Activity Tolerance Patient tolerated treatment well (however increased grogginess due to just received pain meds)  Patient left in chair;with call bell in reach;with family/visitor present  Nurse Communication Mobility status for ambulation  General  Behavior During Session [facility] for tasks performed  Cognition [facility] for tasks performed  PT - Assessment/Plan  Comments on Treatment Session Patient with increased R knee pain and increased lethargy/grogginess  secondary to just receiving pain medication.   PT Plan Discharge plan remains appropriate;Frequency remains appropriate  PT Frequency 7X/week  Follow Up Recommendations Home health PT  Equipment Recommended None recommended by PT  Acute Rehab PT Goals  PT Goal: Supine/Side to Sit - Progress Progressing toward goal  PT Goal: Sit to Stand - Progress Progressing toward goal  PT Goal: Ambulate - Progress Progressing toward goal  PT Goal: Up/Down Stairs - Progress Progressing toward goal  PT Goal: Perform Home Exercise Program - Progress Met   Pain: 6/10, R knee, RN provided pain medication  Lewis Shock, PT, DPT Pager #: [phone] Office #: [phone]

## 2011-12-25 NOTE — Progress Notes (Signed)
Subjective: Doing well.  Pain controlled.  Objective: Vital signs in last 24 hours: Temp:  [99.4 F (37.4 C)-99.5 F (37.5 C)] 99.4 F (37.4 C) (02/15 0557) Pulse Rate:  [102-118] 118  (02/15 0557) Resp:  [18] 18  (02/15 0557) BP: (119-120)/(61-71) 120/71 mmHg (02/15 0557) SpO2:  [92 %-93 %] 92 % (02/15 0557)  Intake/Output from previous day: 02/14 0701 - 02/15 0700 In: 480 [P.O.:480] Out: 2800 [Urine:2700; Drains:100] Intake/Output this shift: Total I/O In: 240 [P.O.:240] Out: 200 [Urine:200]   Basename 12/25/11 0630 12/24/11 0516  HGB 9.9* 11.0*    Basename 12/25/11 0630 12/24/11 0516  WBC 5.3 7.2  RBC 3.20* 3.59*  HCT 29.4* 33.1*  PLT 205 238    Basename 12/25/11 0630 12/24/11 0516  NA 137 140  K 3.4* 4.4  CL 102 104  CO2 26 25  BUN 11 15  CREATININE 0.63 0.71  GLUCOSE 109* 117*  CALCIUM 8.8 9.1    Basename 12/25/11 0630 12/24/11 0516  LABPT -- --  INR 1.67* 1.09    Neurovascular intact. Wound looks good.  Staples intact.  No drainage or signs of infection.  hemovac removed.   Assessment/Plan: D/c foley.  Anticipate d/c home Saturday.   Evonte Prestage M 12/25/2011, 1:25 PM

## 2011-12-25 NOTE — Progress Notes (Signed)
ANTICOAGULATION CONSULT NOTE - Follow-up  Pharmacy Consult for Coumadin Indication: VTE prophylaxis s/p R TKA  Allergies  Allergen Reactions  . Macrobid Nausea Only    Severe headache  . Alprazolam     Patient Measurements: Height: 5' 8.9" (175 cm) Weight: 216 lb 7.9 oz (98.2 kg) IBW/kg (Calculated) : 65.97   Vital Signs: Temp: 99.4 F (37.4 C) (02/15 0557) BP: 120/71 mmHg (02/15 0557) Pulse Rate: 118  (02/15 0557)  Labs:  Basename 12/25/11 0630 12/24/11 0516  HGB 9.9* 11.0*  HCT 29.4* 33.1*  PLT 205 238  APTT -- --  LABPROT 20.0* 14.3  INR 1.67* 1.09  HEPARINUNFRC -- --  CREATININE 0.63 0.71  CKTOTAL -- --  CKMB -- --  TROPONINI -- --   Estimated Creatinine Clearance: 100.1 ml/min (by C-G formula based on Cr of 0.63).  Assessment: 32 YOF s/p right TKA to start coumadin for post-op VTE prophylaxis. INR rising today. Patient is also on lovenox 30mg  sq q12hrs. No bleeding noted.   Goal of Therapy:  INR 2-3   Plan:  1. Coumadin 5 mg po x 1 2. F/u daily INR 3. D/c lovenox when INR > 2  Jaylie Neaves, Gwenlyn Found, PharmD, BCPS  12/25/2011,9:37 AM

## 2011-12-25 NOTE — Progress Notes (Signed)
PT Treatment Note   12/25/11 0834  PT Visit Information  Last PT Received On 12/25/11  Precautions  Precautions Knee  Restrictions  RLE Weight Bearing WBAT  Bed Mobility  Bed Mobility No (pt received sitting up in chair)  Transfers  Sit to Stand 4: Min assist  Ambulation/Gait  Ambulation/Gait Assistance 4: Min assist  Ambulation/Gait Assistance Details (indicate cue type and reason) patient with improved sequence, trialed ambulation without KI and tolerated well. no episodes of  R knee buckling  Ambulation Distance (Feet) 75 Feet  Assistive device Rolling walker  Gait Pattern Step-to pattern;Decreased step length - right;Decreased stance time - right  Gait velocity decreased  Stairs No  Wheelchair Mobility  Wheelchair Mobility No  Posture/Postural Control  Posture/Postural Control No significant limitations  Balance  Balance Assessed No  Total Joint Exercises  Heel Slides AROM;10 reps;Right;Seated (with towel beneath foot)  Long Arc Quad AROM;Right;10 reps;Seated  PT - End of Session  Equipment Utilized During Treatment Gait belt  Activity Tolerance Patient tolerated treatment well  Patient left in chair (with OT)  General  Behavior During Session [facility] for tasks performed  Cognition [facility] for tasks performed  PT - Assessment/Plan  Comments on Treatment Session Patient with improved transfer and ambulation tolerance this date. Will trial steps when cousin/caregiver arrives.  PT Plan Discharge plan remains appropriate;Frequency remains appropriate  PT Frequency 7X/week  Follow Up Recommendations Home health PT  Acute Rehab PT Goals  PT Goal: Supine/Side to Sit - Progress Progressing toward goal  PT Goal: Sit to Stand - Progress Progressing toward goal  PT Goal: Ambulate - Progress Progressing toward goal  PT Goal: Up/Down Stairs - Progress Progressing toward goal  PT Goal: Perform Home Exercise Program - Progress Met    Pain: 5/10 R knee  Lewis Shock, PT,  DPT Pager #: [phone] Office #: [phone]

## 2011-12-25 NOTE — Evaluation (Signed)
Occupational Therapy Evaluation Patient Details Name: [name] MRN: [id number] DOB: 05-12-1956 Today's Date: 12/25/2011  Problem List:  Patient Active Problem List  Diagnoses  . CANDIDIASIS  . VITAMIN D DEFICIENCY  . HYPERLIPIDEMIA  . BIPOLAR AFFECTIVE DISORDER  . DEPRESSIVE DISORDER  . ADD  . RHINITIS  . VAGINITIS  . HOT FLASHES  . MUSCLE WEAKNESS (GENERALIZED)  . FATIGUE  . MUSCULOSKELETAL PAIN  . POLYDIPSIA  . COUGH  . Routine gynecological examination  . Dizziness  . Vaginal odor  . UTI (lower urinary tract infection)  . Knee pain  . Hyperglycemia  . Pre-op exam    Past Medical History:  Past Medical History  Diagnosis Date  . Bipolar disorder   . MVP (mitral valve prolapse)   . Hyperlipidemia   . Asthma   . Allergic rhinitis   . Arthritis     R knee, Left Knee  . Urinary tract infection     frequent, see Urololgist 12/16/2010  . Dysrhythmia     Palpations occ, takes Tenormin  . Constipation    Past Surgical History:  Past Surgical History  Procedure Date  . Nasal sinus surgery 01/2010    x2  . Back surgery 2001    L5 - S1  . Abdominal hysterectomy 2002    no oophorectomy  . Tonsillectomy   . Total knee arthroplasty 12/23/2011    Procedure: TOTAL KNEE ARTHROPLASTY;  Surgeon: Loreta Ave, MD;  Location: [facility] OR;  Service: Orthopedics;  Laterality: Right;  DR MURPHY WANTS 120 MINUTES FOR SURGERY    OT Assessment/Plan/Recommendation OT Assessment Clinical Impression Statement: Pt. presents s/p Rt. TKA and with increased pain. All education complete and will sign off acutely on pt. recommend D/C home with family/caregiver assist. OT Recommendation/Assessment: Patient will need skilled OT in the acute care venue Barriers to Discharge: None OT Recommendation Follow Up Recommendations: No OT follow up Equipment Recommended: None recommended by OT Individuals Consulted Consulted and Agree with Results and Recommendations: Patient OT Goals    OT  Evaluation Precautions/Restrictions  Precautions Precautions: Knee Required Braces or Orthoses: Yes Knee Immobilizer: On except when in CPM;On when out of bed or walking Restrictions Weight Bearing Restrictions: Yes RLE Weight Bearing: Weight bearing as tolerated Prior Functioning Home Living Lives With: Alone (family and caregiver to provide 24/7 assist upon d/c) Receives Help From: Home health;Family;Friend(s) Type of Home: House Home Layout: One level Home Access: Stairs to enter Entrance Stairs-Rails:  (post on R side) Entrance Stairs-Number of Steps: 1 Bathroom Shower/Tub: Forensic scientist: Handicapped height Bathroom Accessibility: Yes How Accessible: Accessible via walker Home Adaptive Equipment: Bedside commode/3-in-1;Walker - rolling;Tub transfer bench Prior Function Level of Independence: Independent with basic ADLs;Independent with homemaking with ambulation;Independent with homemaking with wheelchair;Independent with gait;Independent with transfers Able to Take Stairs?: Yes Driving: Yes Vocation: Retired ADL ADL Eating/Feeding: Performed;Independent Where Assessed - Eating/Feeding: Chair Grooming: Simulated;Wash/dry hands;Wash/dry face;Set up;Supervision/safety Where Assessed - Grooming: Standing at sink Upper Body Bathing: Simulated;Chest;Right arm;Left arm;Abdomen;Set up Where Assessed - Upper Body Bathing: Sitting, chair Lower Body Bathing: Simulated;Set up Lower Body Bathing Details (indicate cue type and reason): With use of long handled sponge Where Assessed - Lower Body Bathing: Sit to stand from chair Upper Body Dressing: Performed;Minimal assistance Upper Body Dressing Details (indicate cue type and reason): With donning gown Where Assessed - Upper Body Dressing: Sitting, chair Lower Body Dressing: Performed;Minimal assistance Lower Body Dressing Details (indicate cue type and reason): Pt. donned bil socks with use  of reacher and  sock aid and educated pt. on use of reacher for LB dressing techniques Where Assessed - Lower Body Dressing: Sit to stand from chair Toilet Transfer: Simulated;Minimal assistance Toilet Transfer Details (indicate cue type and reason): Min verbal cues for hand placement and technique Toilet Transfer Method: Ambulating Toilet Transfer Equipment: Other (comment) (recliner) Toileting - Clothing Manipulation: Simulated;Minimal assistance Where Assessed - Glass blower/designer Manipulation: Standing Toileting - Hygiene: Simulated;Minimal assistance Where Assessed - Toileting Hygiene: Standing Tub/Shower Transfer: Engineer, manufacturing Details (indicate cue type and reason): Mod verbal cues for technique and sequencing of transfer for increased safety Tub/Shower Transfer Method: Ambulating Tub/Shower Transfer Equipment: Counsellor Used: Rolling walker;Sock aid;Reacher Ambulation Related to ADLs: Pt. min guard assist for ambulation ~ 25' with RW ADL Comments: Pt. educated on LB ADL techniques to increase independence at D/C home. Pt. reports will get AE kit prior to D/C home.    Cognition Cognition Arousal/Alertness: Awake/alert Overall Cognitive Status: Appears within functional limits for tasks assessed Orientation Level: Oriented X4    Extremity Assessment RUE Assessment RUE Assessment: Within Functional Limits LUE Assessment LUE Assessment: Within Functional Limits Mobility  Bed Mobility Bed Mobility:  (pt received sitting up in chair) Transfers Transfers: Yes Sit to Stand: 4: Min assist Sit to Stand Details (indicate cue type and reason): min verbal cues for hand placement  End of Session OT - End of Session Equipment Utilized During Treatment: Gait belt Activity Tolerance: Patient tolerated treatment well Patient left: in chair;with call bell in reach Nurse Communication: Mobility status for transfers General Behavior During  Session: [facility] for tasks performed Cognition: [facility] for tasks performed   Laverle Pillard, OTR/L Pager [phone] 12/25/2011, 10:44 AM

## 2011-12-26 LAB — CBC
HCT: 30.2 % — ABNORMAL LOW (ref 36.0–46.0)
Hemoglobin: 10.1 g/dL — ABNORMAL LOW (ref 12.0–15.0)
MCH: 30.5 pg (ref 26.0–34.0)
MCHC: 33.4 g/dL (ref 30.0–36.0)
MCV: 91.2 fL (ref 78.0–100.0)
Platelets: 218 K/uL (ref 150–400)
RBC: 3.31 MIL/uL — ABNORMAL LOW (ref 3.87–5.11)
RDW: 13.1 % (ref 11.5–15.5)
WBC: 5.7 K/uL (ref 4.0–10.5)

## 2011-12-26 LAB — PROTIME-INR
INR: 1.55 — ABNORMAL HIGH (ref 0.00–1.49)
Prothrombin Time: 18.9 seconds — ABNORMAL HIGH (ref 11.6–15.2)

## 2011-12-26 LAB — BASIC METABOLIC PANEL
GFR calc Af Amer: >90 mL/min (ref >90)
GFR calc non Af Amer: >90 mL/min (ref >90)
Potassium: 3.5 mEq/L (ref 3.5–5.1)
Sodium: 138 mEq/L (ref 135–145)

## 2011-12-26 MED ORDER — WARFARIN SODIUM 7.5 MG PO TABS
7.5000 mg | ORAL_TABLET | Freq: Once | ORAL | Status: DC
  Filled 2011-12-26: qty 1

## 2011-12-26 NOTE — Progress Notes (Signed)
Physical Therapy Treatment Patient Details Name: [name] MRN: [id number] DOB: 05/06/1956 Today's Date: 12/26/2011  PT Assessment/Plan  PT - Assessment/Plan Comments on Treatment Session: Pt admitted s/p right TKA and has continued to increase independence/tolerance to mobility.  Pt is ready for d/c home once medically cleared by MD. PT Plan: Discharge plan remains appropriate;Frequency remains appropriate PT Frequency: 7X/week Follow Up Recommendations: Home health PT Equipment Recommended: None recommended by PT PT Goals  Acute Rehab PT Goals PT Goal Formulation: With patient Time For Goal Achievement: 7 days PT Goal: Supine/Side to Sit - Progress: Met PT Goal: Sit to Stand - Progress: Progressing toward goal PT Goal: Ambulate - Progress: Progressing toward goal PT Goal: Up/Down Stairs - Progress: Progressing toward goal  PT Treatment Precautions/Restrictions  Precautions Precautions: Knee Precaution Booklet Issued: No Required Braces or Orthoses: Yes Knee Immobilizer: On except when in CPM;On when out of bed or walking (Right LE.) Restrictions Weight Bearing Restrictions: Yes RLE Weight Bearing: Weight bearing as tolerated Mobility (including Balance) Bed Mobility Bed Mobility: Yes Supine to Sit: 6: Modified independent (Device/Increase time) (2 trials.) Sit to Supine: 6: Modified independent (Device/Increase time) Transfers Transfers: Yes Sit to Stand: 5: Supervision;With upper extremity assist;From bed Sit to Stand Details (indicate cue type and reason): Verbal cues for hand placement. Stand to Sit: 5: Supervision;With upper extremity assist;To chair/3-in-1 Stand to Sit Details: Verbal cues for hand placement. Ambulation/Gait Ambulation/Gait: Yes Ambulation/Gait Assistance: 5: Supervision Ambulation/Gait Assistance Details (indicate cue type and reason): Verbal cues for safest sequence inside RW. Ambulation Distance (Feet): 120 Feet Assistive device: Rolling  walker Gait Pattern: Step-to pattern;Decreased step length - right;Decreased stance time - right Gait velocity: decreased Stairs: Yes Stairs Assistance: 4: Min assist (3 trials:  backward with RW twice and forward with HHA.) Stairs Assistance Details (indicate cue type and reason): Verbal cues for sequence using "up with good, down with bad."  Trialed backwards with RW for 1 stair x 2 instances and supervision as well as forward with unilateral HHA requiring min assist only for HHA. Stair Management Technique: Backwards;With walker;Step to pattern (2 trials backward; 1 trial forward with HHA.) Number of Stairs: 3  (1 step x 3 trials (twice backwards, once forward).) Height of Stairs: 4  (inches.) Wheelchair Mobility Wheelchair Mobility: No  Posture/Postural Control Posture/Postural Control: No significant limitations Balance Balance Assessed: No End of Session PT - End of Session Equipment Utilized During Treatment: Gait belt Activity Tolerance: Patient tolerated treatment well Patient left: in chair;with call bell in reach Nurse Communication: Mobility status for transfers;Mobility status for ambulation General Behavior During Session: [facility] for tasks performed Cognition: [facility] for tasks performed  Cephus Shelling 12/26/2011, 11:54 AM  12/26/2011 Cephus Shelling, PT, DPT [phone]

## 2011-12-26 NOTE — Progress Notes (Signed)
Patient ID: [name], female   DOB: 11/09/1956, 56 y.o.   MRN: [id number] PATIENT ID:      [name]  MRN:     [id number] DOB/AGE:    Sep 17, 1956 / 56 y.o.    PROGRESS NOTE Subjective:  negative for Chest Pain  negative for Shortness of Breath  negative for Nausea/Vomiting   negative for Calf Pain  positive for Bowel Movement   Tolerating Diet: yes         Patient reports pain as 5 on 0-10 scale.    Objective: Vital signs in last 24 hours:   Patient Vitals for the past 24 hrs:  BP Temp Pulse Resp SpO2  12/26/11 0619 103/63 mmHg 98.2 F (36.8 C) 91  18  97 %  12/25/11 2109 126/73 mmHg 98.9 F (37.2 C) 84  18  96 %  12/25/11 2046 - - - - 95 %  12/25/11 1440 100/63 mmHg 97.9 F (36.6 C) 73  18  94 %    @flow {1959:LAST@   Intake/Output from previous day:   02/15 0701 - 02/16 0700 In: 480 [P.O.:480] Out: 200 [Urine:200]   Intake/Output this shift:       Intake/Output      02/15 0701 - 02/16 0700 02/16 0701 - 02/17 0700   P.O. 480    Total Intake(mL/kg) 480 (4.9)    Urine (mL/kg/hr) 200 (0.1)    Drains     Total Output 200    Net +280         Urine Occurrence 5 x       LABORATORY DATA:  Basename 12/26/11 0600 12/25/11 0630 12/24/11 0516  WBC 5.7 5.3 7.2  HGB 10.1* 9.9* 11.0*  HCT 30.2* 29.4* 33.1*  PLT 218 205 238    Basename 12/26/11 0600 12/25/11 0630 12/24/11 0516  NA 138 137 140  K 3.5 3.4* 4.4  CL 101 102 104  CO2 29 26 25   BUN 9 11 15   CREATININE 0.69 0.63 0.71  GLUCOSE 103* 109* 117*  CALCIUM 9.1 8.8 9.1   Lab Results  Component Value Date   INR 1.55* 12/26/2011   INR 1.67* 12/25/2011   INR 1.09 12/24/2011    Examination:  General appearance: alert, cooperative, no distress and mildly obese  Wound Exam: clean, dry, intact   Drainage:  None: wound tissue dry  Motor Exam: EHL, FHL, Anterior Tibial and Posterior Tibial Intact  Sensory Exam: Superficial Peroneal, Deep Peroneal and Tibial normal  Vascular Exam: pulses +1  bilaterally  Assessment:    3 Days Post-Op  Procedure(s) (LRB): TOTAL KNEE ARTHROPLASTY (Right)  ADDITIONAL DIAGNOSIS:  Active Problems:  * No active hospital problems. *   Acute Blood Loss Anemia   Plan: Physical Therapy as ordered Weight Bearing as Tolerated (WBAT)  DVT Prophylaxis:  Lovenox and Coumadin  DISCHARGE PLAN: Home today  DISCHARGE NEEDS: HHPT, CPM, Walker and 3-in-1 comode seat         Kyelle Urbas W 12/26/2011, 9:55 AM

## 2011-12-26 NOTE — Progress Notes (Signed)
ANTICOAGULATION CONSULT NOTE - Follow-up  Pharmacy Consult for Coumadin Indication: VTE prophylaxis s/p R TKA  Allergies  Allergen Reactions  . Macrobid Nausea Only    Severe headache  . Alprazolam     Patient Measurements: Height: 5' 8.9" (175 cm) Weight: 216 lb 7.9 oz (98.2 kg) IBW/kg (Calculated) : 65.97   Vital Signs: Temp: 98.2 F (36.8 C) (02/16 0619) BP: 103/63 mmHg (02/16 0619) Pulse Rate: 91  (02/16 0619)  Labs:  Basename 12/26/11 0600 12/25/11 0630 12/24/11 0516  HGB 10.1* 9.9* --  HCT 30.2* 29.4* 33.1*  PLT 218 205 238  APTT -- -- --  LABPROT 18.9* 20.0* 14.3  INR 1.55* 1.67* 1.09  HEPARINUNFRC -- -- --  CREATININE 0.69 0.63 0.71  CKTOTAL -- -- --  CKMB -- -- --  TROPONINI -- -- --   Estimated Creatinine Clearance: 100.1 ml/min (by C-G formula based on Cr of 0.69).  Assessment: 15 YOF s/p right TKA to start coumadin for post-op VTE prophylaxis. INR still subtherapeutic today. Patient is also on lovenox 30mg  sq q12hrs. No bleeding noted.   Goal of Therapy:  INR 2-3   Plan:  1. Coumadin 7.5 mg po x 1 2. Noted plans to d/c home today, recommend Coumadin 7.5 mg daily at home until next INR check.  Would check INR Mon or Tues. 3. D/c lovenox when INR > 2  Trinka Keshishyan, Gwenlyn Found, PharmD, BCPS  12/26/2011,10:39 AM

## 2012-01-06 NOTE — Discharge Summary (Signed)
NAMEAISLIN, [name]                 ACCOUNT NO.:  [id number]  MEDICAL RECORD NO.:  [id number]  LOCATION:  5009                         FACILITY:  [facility]  PHYSICIAN:  Genene Churn. Barry Dienes, P.A.   DATE OF BIRTH:  1956-06-20  DATE OF ADMISSION:  12/23/2011 DATE OF DISCHARGE:  12/26/2011                              DISCHARGE SUMMARY   FINAL DIAGNOSES: 1. Status post right total knee replacement for end-stage degenerative     joint disease. 2. Bipolar disorder. 3. Asthma. 4. Hypertension.  HISTORY OF PRESENT ILLNESS:  A 56 year old white female with history of end-stage DJD right knee and chronic pain presented to our office for preop evaluation for total knee replacement.  She had progressively worsening pain with failed response with conservative treatment. Significant decrease in her daily activities due to the ongoing complaint.  SURGEON:  Mckinley Jewel, MD.  ASSISTANT:  Zonia Kief [facility].  ANESTHESIA:  General with femoral nerve block.  PATHOLOGY:  No specimens.  TOURNIQUET TIME:  73 minutes.  DRAINS:  One Hemovac drain placed.  HOSPITAL COURSE:  On December 23, 2011, the patient was taken to the [facility] OR and a right total knee replacement procedure performed.  There were no surgical or anesthesia complications. The patient was transferred to recovery in stable condition.  After arriving to the orthopedic unit, pharmacy protocol, Coumadin, Lovenox started for DVT prophylaxis.  On December 24, 2011, the patient doing well with good pain control. Did have some feeling of anxiety over night.  Hemoglobin 11.0, hematocrit 33.1. Sodium 140, potassium 4.4, chloride 104, CO2 of 25, BUN 15, creatinine 0.71, glucose 117, INR 1.09.  Dressing clean, dry, intact.  Calf nontender neurovascularly.  Skin warm and dry.  PT/OT consults.  On December 25, 2011, again the patient doing well.  Hemoglobin 9.9, hematocrit 29.4. Potassium 3.4. INR 1.67.  Knee wound looks good and staples  intact.  No drainage or signs of infection. Hemovac drain removed.  Discontinued Foley.  Continue rehab and anticipate discharge home Saturday.  On December 26, 2011, the patient doing well and states she is ready to go home. Excellent progress with rehab.  Vital signs stable, afebrile. Hemoglobin 10.1, hematocrit 30.2, glucose 103. INR 1.55.  Knee wound looks good and staples intact.  No drainage or signs of infection.  Calf nontender neurovascularly.  Skin warm and dry.  CONDITION:  Good and stable.  DISPOSITION:  Discharged home.  MEDICATIONS: 1. Percocet 5/325 one to two tablets p.o. every 4 to 6 hours p.r.n.     for pain. 2. Robaxin 500 mg 1 tablet p.o. every 6 hours p.r.n. for spasms. 3. Lovenox 30 mg 1 subcu injection every 12 hours and discontinue when     Coumadin is therapeutic with INR 2-3. 4. Coumadin pharmacy protocol.  Maintain INR 2 to 3 x4 weeks postop     for DVT prophylaxis.  This will be monitored by home health agency. 5. Resume previous home meds.  DISCHARGE INSTRUCTIONS:  The patient worked with home health PT and OT to improve ambulation and knee range of motion and strengthening. Weight bear as tolerated.  Continue using CPM 6 to 8 hours daily.  Okay to shower, but no tub soaking.  Daily dressing changes with 4 x 4 gauze and apply TED hose over this.  Coumadin x4 weeks postop for DVT prophylaxis and discontinue Lovenox when Coumadin is therapeutic with INR of 2 to 3.  Follow up in office with Dr. Eulah Pont 2 weeks postop for recheck or will return sooner if needed.     Genene Churn. Denton Meek.     JMO/MEDQ  D:  01/06/2012  T:  01/06/2012  Job:  [id number]

## 2012-03-01 ENCOUNTER — Ambulatory Visit (INDEPENDENT_AMBULATORY_CARE_PROVIDER_SITE_OTHER): Payer: Medicare Other | Admitting: Internal Medicine

## 2012-03-01 VITALS — BP 130/82 | HR 76 | Temp 98.2°F | Wt 214.0 lb

## 2012-03-01 DIAGNOSIS — J069 Acute upper respiratory infection, unspecified: Secondary | ICD-10-CM

## 2012-03-01 DIAGNOSIS — J309 Allergic rhinitis, unspecified: Secondary | ICD-10-CM

## 2012-03-01 MED ORDER — AMOXICILLIN 500 MG PO CAPS: 1000.0000 mg | ORAL_CAPSULE | Freq: Two times a day (BID) | ORAL | Status: AC

## 2012-03-01 NOTE — Patient Instructions (Signed)
Rest, fluids , tylenol For cough, take Mucinex DM twice a day as needed  Continue with the  allergy medications Take the antibiotic as prescribed  (Amoxicillin) Call if no better in few days Call anytime if the symptoms are severe

## 2012-03-01 NOTE — Progress Notes (Signed)
  Subjective:    Patient ID: [name], female    DOB: [birth year], 56 y.o.   MRN: [id number]  HPI Acute visit: Sx started a week ago with ST which is now slt better. Then she developed other sx: Fatigue, headaches mostly frontal, sinus congestion.  Past medical history reviewed   Past surgical history reviewed   Review of Systems No fever or chills. Very mild cough, no wheezing or chest congestion. No itchiness in the eyes or  nose but she has a sneezing more frequently lately. Status post a right total knee replacement-2000 been doing physical therapy, feeling well.      Objective:   Physical Exam  General -- alert, well-developed. No apparent distress.  Neck --no LADs HEENT -- TMs normal, throat w/o redness, face symmetric and  slt tender to palpation at both maxillary and frontal sinuses , nose congested . Lungs -- normal respiratory effort, no intercostal retractions, no accessory muscle use, and normal breath sounds.   Heart-- normal rate, regular rhythm, no murmur, and no gallop.   Extremities-- no pretibial edema bilaterally  Psych-- Cognition and judgment appear intact. Alert and cooperative with normal attention span and concentration.  not anxious appearing and not depressed appearing.         Assessment & Plan:  URI versus allergies, Upper respiratory symptoms, URI versus allergies, she is already taking an antihistaminic, nasal sprays, Singulair. Plan: Continue with present meds Amoxicillin Patient to call if not better in a few days. See instructions

## 2012-03-02 ENCOUNTER — Ambulatory Visit: Payer: Medicare Other | Admitting: Family Medicine

## 2012-03-02 ENCOUNTER — Encounter: Payer: Self-pay | Admitting: Internal Medicine

## 2012-04-13 ENCOUNTER — Other Ambulatory Visit: Payer: Self-pay | Admitting: Family Medicine

## 2012-04-13 MED ORDER — FENOFIBRATE 160 MG PO TABS: 160.0000 mg | ORAL_TABLET | Freq: Every day | ORAL | Status: DC

## 2012-04-13 NOTE — Telephone Encounter (Signed)
refill fenofibrate 160mg  tablet Qty 30 Take one tablet by mouth every day  Last filled 05.13.13 Last ov 4.23.13

## 2012-04-13 NOTE — Telephone Encounter (Signed)
rx sent to pharmacy by e-script  

## 2012-04-15 ENCOUNTER — Telehealth: Payer: Self-pay | Admitting: *Deleted

## 2012-04-15 MED ORDER — ROSUVASTATIN CALCIUM 10 MG PO TABS: 10.0000 mg | ORAL_TABLET | Freq: Every day | ORAL | Status: DC

## 2012-04-15 NOTE — Telephone Encounter (Signed)
Pt called to advise she wants to have a 90 day supply [facility] to [facility] [address], pt aware to call back in to schedule her 6month follow up office visit in 3 months. Pt understood

## 2012-05-16 ENCOUNTER — Telehealth: Payer: Self-pay | Admitting: *Deleted

## 2012-05-16 ENCOUNTER — Telehealth: Payer: Self-pay | Admitting: Family Medicine

## 2012-05-16 NOTE — Telephone Encounter (Signed)
Pt  Called and would like someone to call her back - questioning her physical and retaking chole testing, amym

## 2012-05-16 NOTE — Telephone Encounter (Signed)
Returned pt call to discuss her questions about CPE and cholesterol testing, advised to call office

## 2012-05-16 NOTE — Telephone Encounter (Signed)
Patient would like to know if time to come in for Cholesterol labs? Please advise.

## 2012-05-16 NOTE — Telephone Encounter (Signed)
Left vm on all numbers listed in chart advising pt needs to have fasting labs with her Complete Physical AFTER 07-02-12 per last CPE noted 07-03-11, per MD Tabori gave verbal order pt can wait til then to have the labs drawn per upcoming date

## 2012-05-17 NOTE — Telephone Encounter (Signed)
Called pt to advise and she noted that she scheduled an apt for 07-08-12

## 2012-05-17 NOTE — Telephone Encounter (Signed)
Called pt to advise and she noted that she scheduled an apt for 07-08-12 

## 2012-05-20 ENCOUNTER — Other Ambulatory Visit: Payer: Self-pay | Admitting: Family Medicine

## 2012-05-20 MED ORDER — ATENOLOL 50 MG PO TABS: 50.0000 mg | ORAL_TABLET | Freq: Every day | ORAL | Status: DC

## 2012-05-20 NOTE — Telephone Encounter (Signed)
refill Atenolol (Tab) TENORMIN 50 MG Take 50 mg by mouth daily. #180 last ov 4.23.13

## 2012-05-20 NOTE — Telephone Encounter (Signed)
Rx sent 

## 2012-06-16 ENCOUNTER — Ambulatory Visit: Payer: Medicare Other | Admitting: Family Medicine

## 2012-06-20 ENCOUNTER — Encounter: Payer: Self-pay | Admitting: Family Medicine

## 2012-06-20 ENCOUNTER — Ambulatory Visit (INDEPENDENT_AMBULATORY_CARE_PROVIDER_SITE_OTHER): Payer: Medicare Other | Admitting: Family Medicine

## 2012-06-20 VITALS — BP 135/73 | HR 77 | Temp 98.7°F | Ht 68.75 in | Wt 218.6 lb

## 2012-06-20 DIAGNOSIS — R42 Dizziness and giddiness: Secondary | ICD-10-CM

## 2012-06-20 LAB — CBC WITH DIFFERENTIAL/PLATELET
Basophils Absolute: 0 10*3/uL (ref 0.0–0.1)
Basophils Relative: 0.2 % (ref 0.0–3.0)
HCT: 33.4 % — ABNORMAL LOW (ref 36.0–46.0)
Hemoglobin: 11.4 g/dL — ABNORMAL LOW (ref 12.0–15.0)
Lymphocytes Relative: 23.9 % (ref 12.0–46.0)
Lymphs Abs: 1.3 10*3/uL (ref 0.7–4.0)
Monocytes Relative: 7.2 % (ref 3.0–12.0)
Neutro Abs: 3.7 10*3/uL (ref 1.4–7.7)
RBC: 3.68 Mil/uL — ABNORMAL LOW (ref 3.87–5.11)
RDW: 13.8 % (ref 11.5–14.6)

## 2012-06-20 LAB — BASIC METABOLIC PANEL
BUN: 27 mg/dL — ABNORMAL HIGH (ref 6–23)
CO2: 27 mEq/L (ref 19–32)
Calcium: 9.3 mg/dL (ref 8.4–10.5)
Chloride: 102 mEq/L (ref 96–112)
Creatinine, Ser: 0.8 mg/dL (ref 0.4–1.2)
Glucose, Bld: 80 mg/dL (ref 70–99)

## 2012-06-20 MED ORDER — MECLIZINE HCL 25 MG PO TABS: 25.0000 mg | ORAL_TABLET | Freq: Three times a day (TID) | ORAL | Status: AC | PRN

## 2012-06-20 NOTE — Patient Instructions (Addendum)
We'll notify you of your lab results Start the Meclizine as needed for the dizziness Try and keep a journal of your symptoms- when they occur, how long they last, what makes it better/worse, etc We can always send you to neuro Make sure you drink plenty of water Get lots of sleep Eat regularly Call with any questions or concerns Hang in there!!!

## 2012-06-20 NOTE — Assessment & Plan Note (Signed)
Deteriorated.  Pt reports that she has had this for 'years'.  Unclear as to whether pt is having hypoglycemia, orthostasis, anxiety, BPV, or other possible cause.  Pt reports she has been treated w/ meclizine previously but can't recall whether this helped.  Will check labs to r/o abnormal thyroid, anemia, electrolyte imbalance.  Offered neuro referral- pt declined.  Prefers to restart meclizine and see if sxs improve.  Pt to journal her sxs to see if there is a commonality or trigger.  Will follow closely.

## 2012-06-20 NOTE — Progress Notes (Signed)
  Subjective:    Patient ID: [name], female    DOB: [birth year], 56 y.o.   MRN: [id number]  HPI Equilibrium problem- pt reports this has been ongoing throughout her life.  Occurred last week while in [facility]- felt like she was 'going to the R'.  Felt like she was going to pass out.  Felt ok once she got to the car after eating protein bar.  Occuring 'every couple of weeks'.  Will get sweaty and clammy when it starts.  Denies nausea.  'i get scared to death'.  Pt reports she had eaten prior to episode.  sxs will vary from side to side- not always veering to R or L.  Hx of panic attacks and reports this doesn't feel similar.   Review of Systems For ROS see HPI     Objective:   Physical Exam  Vitals reviewed. Constitutional: She is oriented to person, place, and time. She appears well-developed and well-nourished. No distress.  HENT:  Head: Normocephalic and atraumatic.  Nose: Nose normal.  Mouth/Throat: Oropharynx is clear and moist.       TMs WNL No TTP over sinuses  Eyes: Conjunctivae and EOM are normal. Pupils are equal, round, and reactive to light.  Neck: Normal range of motion. Neck supple. No thyromegaly present.  Cardiovascular: Normal rate, regular rhythm and normal heart sounds.   Pulmonary/Chest: Effort normal and breath sounds normal. No respiratory distress. She has no wheezes. She has no rales.  Lymphadenopathy:    She has no cervical adenopathy.  Neurological: She is alert and oriented to person, place, and time. She has normal reflexes. No cranial nerve deficit. Coordination normal.       (-) Romberg Able to stand on 1 foot w/out difficulty Tandem gait intact  Skin: Skin is warm and dry.          Assessment & Plan:

## 2012-06-21 ENCOUNTER — Encounter: Payer: Self-pay | Admitting: *Deleted

## 2012-06-30 ENCOUNTER — Encounter: Payer: Self-pay | Admitting: Family Medicine

## 2012-07-08 ENCOUNTER — Encounter: Payer: Medicare Other | Admitting: Family Medicine

## 2012-07-15 ENCOUNTER — Ambulatory Visit (INDEPENDENT_AMBULATORY_CARE_PROVIDER_SITE_OTHER): Payer: Medicare Other | Admitting: Family Medicine

## 2012-07-15 ENCOUNTER — Encounter: Payer: Self-pay | Admitting: Family Medicine

## 2012-07-15 VITALS — BP 131/78 | HR 84 | Temp 98.2°F | Ht 69.0 in | Wt 223.6 lb

## 2012-07-15 DIAGNOSIS — Z01419 Encounter for gynecological examination (general) (routine) without abnormal findings: Secondary | ICD-10-CM

## 2012-07-15 DIAGNOSIS — E559 Vitamin D deficiency, unspecified: Secondary | ICD-10-CM

## 2012-07-15 DIAGNOSIS — E785 Hyperlipidemia, unspecified: Secondary | ICD-10-CM

## 2012-07-15 DIAGNOSIS — Z23 Encounter for immunization: Secondary | ICD-10-CM

## 2012-07-15 DIAGNOSIS — Z0000 Encounter for general adult medical examination without abnormal findings: Secondary | ICD-10-CM

## 2012-07-15 LAB — BASIC METABOLIC PANEL
BUN: 17 mg/dL (ref 6–23)
CO2: 20 mEq/L (ref 19–32)
Calcium: 9.2 mg/dL (ref 8.4–10.5)
Creatinine, Ser: 0.8 mg/dL (ref 0.4–1.2)
GFR: 84 mL/min (ref >60.00)
Glucose, Bld: 89 mg/dL (ref 70–99)
Sodium: 140 mEq/L (ref 135–145)

## 2012-07-15 LAB — TSH: TSH: 1.08 u[IU]/mL (ref 0.35–5.50)

## 2012-07-15 LAB — HEPATIC FUNCTION PANEL
Albumin: 4.3 g/dL (ref 3.5–5.2)
Alkaline Phosphatase: 35 U/L — ABNORMAL LOW (ref 39–117)
Total Protein: 7.1 g/dL (ref 6.0–8.3)

## 2012-07-15 NOTE — Assessment & Plan Note (Signed)
Check labs, replete prn. 

## 2012-07-15 NOTE — Patient Instructions (Addendum)
Follow up in 6 months We'll notify you of your lab results Keep up the good work!  You look great! Call with any questions or concerns Happy Early Birthday!!!

## 2012-07-15 NOTE — Assessment & Plan Note (Signed)
Chronic problem.  Pt has extensive family hx of CAD and had bad [facility] panel previously.  Taking Crestor daily and tolerating med w/out difficulty.  Will check NMR today to make sure that particle size and # has improved.

## 2012-07-15 NOTE — Progress Notes (Signed)
  Subjective:    Patient ID: [name], female    DOB: 1956/03/07, 56 y.o.   MRN: [id number]  HPI Here today for CPE.  Risk Factors: Hyperlipidemia- chronic problem, on Crestor and fenofibrate.  Strong family hx of CAD.  Due for labs.  Denies abd pain, N/V, myalgias. Physical Activity: walking regularly Fall Risk: low risk Depression: chronic problem, carries dx of bipolar- following regularly w/ psych Hearing: normal to conversational tones and whispered voice at 6 ft ADL's: independent Cognitive: normal linear thought process Home Safety: safe at home Height, Weight, BMI, Visual Acuity: see vitals, vision corrected to 20/20 w/ glasses Counseling: UTD on mammo, colonoscopy, Tdap given today Labs Ordered: See A&P Care Plan: See A&P    Review of Systems Patient reports no vision/ hearing changes, adenopathy,fever, weight change,  persistant/recurrent hoarseness , swallowing issues, chest pain, palpitations, edema, persistant/recurrent cough, hemoptysis, dyspnea (rest/exertional/paroxysmal nocturnal), gastrointestinal bleeding (melena, rectal bleeding), abdominal pain, significant heartburn, bowel changes, GU symptoms (dysuria, hematuria, incontinence), Gyn symptoms (abnormal  bleeding, pain),  syncope, focal weakness, memory loss, numbness & tingling, skin/hair/nail changes, abnormal bruising or bleeding, anxiety, or depression.     Objective:   Physical Exam General Appearance:    Alert, cooperative, no distress, appears stated age, overwt  Head:    Normocephalic, without obvious abnormality, atraumatic  Eyes:    PERRL, conjunctiva/corneas clear, EOM's intact, fundi    benign, both eyes  Ears:    Normal TM's and external ear canals, both ears  Nose:   Nares normal, septum midline, mucosa normal, no drainage    or sinus tenderness  Throat:   Lips, mucosa, and tongue normal; teeth and gums normal  Neck:   Supple, symmetrical, trachea midline, no adenopathy;    Thyroid: no  enlargement/tenderness/nodules  Back:     Symmetric, no curvature, ROM normal, no CVA tenderness  Lungs:     Clear to auscultation bilaterally, respirations unlabored  Chest Wall:    No tenderness or deformity   Heart:    Regular rate and rhythm, S1 and S2 normal, no murmur, rub   or gallop  Breast Exam:    Deferred to GYN  Abdomen:     Soft, non-tender, bowel sounds active all four quadrants,    no masses, no organomegaly  Genitalia:    Deferred to GYN  Rectal:    Extremities:   Extremities normal, atraumatic, no cyanosis or edema  Pulses:   2+ and symmetric all extremities  Skin:   Skin color, texture, turgor normal, no rashes or lesions  Lymph nodes:   Cervical, supraclavicular, and axillary nodes normal  Neurologic:   CNII-XII intact, normal strength, sensation and reflexes    throughout          Assessment & Plan:

## 2012-07-15 NOTE — Assessment & Plan Note (Signed)
Pt's PE WNL w/ exception of obesity.  UTD on health maintenance.  Check labs.  Anticipatory guidance provided.  

## 2012-07-19 LAB — NMR LIPOPROFILE WITH LIPIDS
HDL Particle Number: 41.4 umol/L (ref >=30.5)
HDL-C: 60 mg/dL (ref >=40)
LDL (calc): 101 mg/dL — ABNORMAL HIGH (ref <100)
LDL Particle Number: 2136 nmol/L — ABNORMAL HIGH (ref <1000)
LP-IR Score: 87 — ABNORMAL HIGH (ref <=45)

## 2012-07-20 ENCOUNTER — Telehealth: Payer: Self-pay | Admitting: Family Medicine

## 2012-07-20 ENCOUNTER — Encounter: Payer: Self-pay | Admitting: Family Medicine

## 2012-07-20 ENCOUNTER — Ambulatory Visit (INDEPENDENT_AMBULATORY_CARE_PROVIDER_SITE_OTHER): Payer: Medicare Other | Admitting: Family Medicine

## 2012-07-20 VITALS — BP 125/72 | HR 74 | Temp 98.6°F | Ht 69.0 in | Wt 223.0 lb

## 2012-07-20 DIAGNOSIS — J329 Chronic sinusitis, unspecified: Secondary | ICD-10-CM | POA: Insufficient documentation

## 2012-07-20 DIAGNOSIS — E785 Hyperlipidemia, unspecified: Secondary | ICD-10-CM

## 2012-07-20 LAB — VITAMIN D 1,25 DIHYDROXY
Vitamin D 1, 25 (OH)2 Total: 20 pg/mL (ref 18–72)
Vitamin D3 1, 25 (OH)2: 20 pg/mL

## 2012-07-20 MED ORDER — SULFAMETHOXAZOLE-TRIMETHOPRIM 800-160 MG PO TABS: 1.0000 | ORAL_TABLET | Freq: Two times a day (BID) | ORAL | Status: AC

## 2012-07-20 MED ORDER — FENOFIBRATE 160 MG PO TABS: 160.0000 mg | ORAL_TABLET | Freq: Every day | ORAL | Status: DC

## 2012-07-20 MED ORDER — NIACIN ER (ANTIHYPERLIPIDEMIC) 1000 MG PO TBCR: 1000.0000 mg | EXTENDED_RELEASE_TABLET | Freq: Every day | ORAL | Status: DC

## 2012-07-20 NOTE — Addendum Note (Signed)
Addended by: Derry Lory A on: 07/20/2012 04:31 PM   Modules accepted: Orders

## 2012-07-20 NOTE — Progress Notes (Signed)
  Subjective:    Patient ID: [name], female    DOB: 1956-09-07, 56 y.o.   MRN: [id number]  HPI Bilateral ear pain x3 days.  + sinus pressure/pain.  No fever.  No known sick contacts.  + hx of seasonal allergies.  On Allegra.  Minimal cough.   Review of Systems For ROS see HPI     Objective:   Physical Exam  Vitals reviewed. Constitutional: She appears well-developed and well-nourished. No distress.  HENT:  Head: Normocephalic and atraumatic.  Right Ear: Tympanic membrane normal.  Left Ear: Tympanic membrane normal.  Nose: Mucosal edema and rhinorrhea present. Right sinus exhibits maxillary sinus tenderness and frontal sinus tenderness. Left sinus exhibits maxillary sinus tenderness and frontal sinus tenderness.  Mouth/Throat: Uvula is midline and mucous membranes are normal. Posterior oropharyngeal erythema present. No oropharyngeal exudate.  Eyes: Conjunctivae normal and EOM are normal. Pupils are equal, round, and reactive to light.  Neck: Normal range of motion. Neck supple.  Cardiovascular: Normal rate, regular rhythm and normal heart sounds.   Pulmonary/Chest: Effort normal and breath sounds normal. No respiratory distress. She has no wheezes.  Lymphadenopathy:    She has no cervical adenopathy.          Assessment & Plan:

## 2012-07-20 NOTE — Assessment & Plan Note (Signed)
Chronic problem.  Pt's LDL is at goal but her particle size and # are terrible!  Already on Crestor and Fenofibrate.  Will start Niaspan.  Reviewed supportive care and red flags that should prompt return.  Pt expressed understanding and is in agreement w/ plan.

## 2012-07-20 NOTE — Telephone Encounter (Signed)
Refill: fenofibrate 160mg  tablet. Take 1 tablet by mouth every day. Qty 30. Last fill 7.17.13

## 2012-07-20 NOTE — Telephone Encounter (Signed)
rx sent to pharmacy by e-script  

## 2012-07-20 NOTE — Assessment & Plan Note (Signed)
New.  Pt's sxs and PE consistent w/ infxn.  Start abx.  Reviewed supportive care and red flags that should prompt return.  Pt expressed understanding and is in agreement w/ plan.  

## 2012-07-20 NOTE — Patient Instructions (Addendum)
This is a sinus infection Start the Bactrim twice daily (w/ food) for 10 days Continue your allergy meds Once you're done w/ your antibiotic, start the Niaspan before bed.  Take the 1 month of samples and then when you pick up the prescription it will be a higher dose (500mg  vs 1000mg ) Continue the Crestor, Fenofibrate Call with any questions or concerns Hang in there!

## 2012-07-22 ENCOUNTER — Telehealth: Payer: Self-pay | Admitting: Family Medicine

## 2012-07-22 MED ORDER — AMOXICILLIN 875 MG PO TABS: 875.0000 mg | ORAL_TABLET | Freq: Two times a day (BID) | ORAL | Status: DC

## 2012-07-22 NOTE — Telephone Encounter (Signed)
Ok to switch to Amox 875mg   Bid x10 days, #20, no refills

## 2012-07-22 NOTE — Telephone Encounter (Signed)
Please advise 

## 2012-07-22 NOTE — Telephone Encounter (Signed)
Mess Left on Triage line regarding BACTRIM @ 926am pt states she is allergic it is making her breakout & burn up, would like new rx for Amoxicillin CB# [phone]

## 2012-07-22 NOTE — Telephone Encounter (Signed)
RX sent.  Message left new RX sent to [facility]

## 2012-07-26 ENCOUNTER — Telehealth: Payer: Self-pay | Admitting: Family Medicine

## 2012-07-26 DIAGNOSIS — E782 Mixed hyperlipidemia: Secondary | ICD-10-CM

## 2012-07-26 NOTE — Telephone Encounter (Signed)
Pt states as soon as she started taking the niaspan, but started feeling terrible afterward, headache, arms are red, feeling like pins sticking all over, she did take asa before hand.  She last took it last night and does not want to take anymore.  [facility]

## 2012-07-26 NOTE — Telephone Encounter (Signed)
LM on triage line at 856am Pt states she is having a reaction to the niaspan it is making her feel tired, itchy and she feels like pins are sticking in all over her. She is going to stop taking today  She also stated her fenofibrate has not been sent to the pharmacy yet, she called the pharmacy and they stated we have not responded to them  Epic shows sent Escribe 9.11  Cb# [phone]

## 2012-07-26 NOTE — Telephone Encounter (Signed)
If pt unable to tolerate Niaspan will need to see lipid clinic for better management of high risk lipid profile.  Please refer.

## 2012-07-26 NOTE — Telephone Encounter (Signed)
Spoke to pt to advise results/instructions. Pt understood. Pt will STOP taking Niaspan, advised [facility] Tiffany and verifed the RX for fenofibrite is there, pt advised she will be out of town 08-09-12 through 08-13-12 and knee surgery for 08-31-12 schedule thus can not have apt for lipid clinic on those days, added notations to referral in chart, pt understood all instructions and will pick up fenofibrite when ready

## 2012-07-27 ENCOUNTER — Telehealth: Payer: Self-pay | Admitting: Family Medicine

## 2012-07-27 NOTE — Telephone Encounter (Signed)
LM on triage line 9.17.13 429pm  Needs labs faxed to psych dr. Tresa Endo virgil [phone] once done needs call back to confirm has an appt today  Cb# [phone]  I do not see a release form to share information with this provider and as recently informed HIPPA does not allow a verbal notification from the patient

## 2012-07-27 NOTE — Telephone Encounter (Signed)
Pt will come by today to sign release and to pick up a copy of her lab work to take with her.

## 2012-08-01 ENCOUNTER — Telehealth: Payer: Self-pay | Admitting: Hematology & Oncology

## 2012-08-01 ENCOUNTER — Telehealth: Payer: Self-pay | Admitting: Family Medicine

## 2012-08-01 NOTE — Telephone Encounter (Signed)
Patient is scheduled to be seen in Lipid Clinic on 08/08/12.  Patient is questioning why she needs to be seen in the lipid clinic instead of just raising her Crestor to 40 mg.  Please advise.

## 2012-08-01 NOTE — Telephone Encounter (Signed)
That may be an option but due to her exceptionally high particle number and family hx, we need the experts to help [place] w/ this decision.  Especially since she has had trouble tolerating meds in the past.

## 2012-08-01 NOTE — Telephone Encounter (Signed)
Renee at referring aware we don't see pt's for this

## 2012-08-02 NOTE — Telephone Encounter (Signed)
OZH:YQMVH to pt to advise results/instructions. Pt understood. Pt will keep her apt with the lipid apt per MD Tabori instructions.

## 2012-08-05 ENCOUNTER — Other Ambulatory Visit: Payer: Self-pay | Admitting: Family Medicine

## 2012-08-05 MED ORDER — MONTELUKAST SODIUM 10 MG PO TABS: 10.0000 mg | ORAL_TABLET | Freq: Every day | ORAL | Status: DC

## 2012-08-05 NOTE — Telephone Encounter (Signed)
MONTELUKAST SOD 10 MG  QTY:30 LAST REFILL:02/22/12 TAKE 1 TABLET BY MOUTH ONCE DAILY

## 2012-08-05 NOTE — Telephone Encounter (Signed)
Refill done.  

## 2012-08-08 ENCOUNTER — Ambulatory Visit (INDEPENDENT_AMBULATORY_CARE_PROVIDER_SITE_OTHER): Payer: Medicare Other | Admitting: Pharmacist

## 2012-08-08 DIAGNOSIS — E785 Hyperlipidemia, unspecified: Secondary | ICD-10-CM

## 2012-08-08 NOTE — Patient Instructions (Addendum)
It was nice meeting you today  Start taking the Zetia one 10mg  tablet by mouth daily  Continue to work on your diet and we will see you back in 6 weeks

## 2012-08-08 NOTE — Progress Notes (Signed)
HPI  [name] is a 56 yo F who is seen for her first visit in Lipid Clinic. Patient was in good spirits but fairly concerned with her cholesterol. She does not have any other significant co-morbidities (No CAD, HTN, DM, not a smoker). Her family history is significant for her father who died of an MI at age 17. She also has a brother w/ high cholesterol but like her has no other significant comorbidities.  Patient is currently taking Crestor 10mg , fenofibrate 160mg , and Krill oil. She has tried Niaspan in the past and experienced intolerable flushing. Patient also did not tolerate atorvastatin in the past but wasn't sure why.   Patient is currently unable to exercise much due to a knee brace, but she endorses walking in the past and looks forward to exercising more after the brace comes off. Her diet has been worse recently, but she was adamant about improving it. Her avg breakfast is small and usually consist of oatmeal. Lunch is usually her largest meal and she will often go out for lunch. She used to eat half and bring the leftovers home and plans to resume this practice. Dinner is often small and fairly inconsistent. She does snack frequently/stress eat on things like chips/crackers, but she does not drink sweet beverages.   Current Outpatient Prescriptions on File Prior to Visit  Medication Sig Dispense Refill  . albuterol (PROVENTIL HFA;VENTOLIN HFA) 108 (90 BASE) MCG/ACT inhaler Inhale 2 puffs into the lungs every 6 (six) hours as needed. For shortness of breath      . atenolol (TENORMIN) 50 MG tablet Take 1 tablet (50 mg total) by mouth daily.  180 tablet  0  . beclomethasone (QVAR) 80 MCG/ACT inhaler Inhale 2 puffs into the lungs 2 (two) times daily.  1 Inhaler  3  . clonazePAM (KLONOPIN) 1 MG tablet Take 1 mg by mouth 4 (four) times daily as needed. For anxiety      . estrogens, conjugated, (PREMARIN) 0.625 MG tablet Take 0.625 mg by mouth daily.       . fenofibrate 160 MG tablet Take 1  tablet (160 mg total) by mouth daily.  90 tablet  1  . fexofenadine (ALLEGRA) 180 MG tablet Take 180 mg by mouth daily.      . fluticasone (FLONASE) 50 MCG/ACT nasal spray Place 2 sprays into the nose daily.  16 g  3  . HYDROcodone-acetaminophen (NORCO) 5-325 MG per tablet Take 1 tablet by mouth every 6 (six) hours as needed.      . lamoTRIgine (LAMICTAL) 200 MG tablet Take 200 mg by mouth 2 (two) times daily.       . montelukast (SINGULAIR) 10 MG tablet Take 1 tablet (10 mg total) by mouth at bedtime.  30 tablet  3  . QUEtiapine (SEROQUEL XR) 300 MG 24 hr tablet Take 450 mg by mouth at bedtime.      . rosuvastatin (CRESTOR) 10 MG tablet Take 1 tablet (10 mg total) by mouth at bedtime.  90 tablet  0  . venlafaxine (EFFEXOR-XR) 75 MG 24 hr capsule Take 225 mg by mouth daily.       Marland Kitchen buPROPion (WELLBUTRIN SR) 200 MG 12 hr tablet       . gabapentin (NEURONTIN) 300 MG capsule       . OLANZapine (ZYPREXA) 10 MG tablet         Allergies  Allergen Reactions  . Nitrofurantoin Monohyd Macro Nausea Only    Severe headache  .  Alprazolam

## 2012-08-09 NOTE — Assessment & Plan Note (Signed)
Assesment/Plan  TC 238  (goal<200), TG 387 (goal<150), HDL 60 (goal>45), LDL 101 (goal<100), and LDL Particle Number [id number] (<2542). LFTs are WNL. Patient is near goal on all labs except TG and particle #. This is the first time her TG have been significantly elevated which should improve with improving her diet. Will start zetia 10mg  daily to target patient's high LDL particle number. Samples were given and patient will return in 6 weeks to have lipids rechecked and assess tolerance to Zetia.

## 2012-08-22 ENCOUNTER — Telehealth: Payer: Self-pay | Admitting: Family Medicine

## 2012-08-22 NOTE — Telephone Encounter (Signed)
Spoke with patient and informed her that message was received and will be addressed tomorrow, patient states that is fine. Patient aware that if she expierences Chest Pain, SOB, left side pain to urgently seek medical attention at the Emergency Room or Urgent Care. Patient verbalized understanding

## 2012-08-22 NOTE — Telephone Encounter (Signed)
Caller: Letticia/Patient; Patient Name: [name]; PCP: Sheliah Hatch.; Best Callback Phone Number: [phone]; Call regarding Reflux flare-up, onset 10-11.  Pt requesting Omeprazole script. Zantac not relieving symptoms.  All emergent symptoms ruled out per Heartburn, call provider immediately due to persistent burning sensation in epigastric area, no relief from home care. Pt last seen on 08-08-12.  [facility] uses  [facility], [phone]. Please follow up with Pt if MD will call in RX.

## 2012-08-23 ENCOUNTER — Encounter (HOSPITAL_COMMUNITY): Payer: Self-pay | Admitting: Pharmacy Technician

## 2012-08-23 ENCOUNTER — Telehealth: Payer: Self-pay | Admitting: Pharmacist

## 2012-08-23 NOTE — Telephone Encounter (Signed)
Ok for Omeprazole 40mg  #30, 3 refills

## 2012-08-23 NOTE — Telephone Encounter (Signed)
.  left message to have patient return my call.  

## 2012-08-23 NOTE — Telephone Encounter (Signed)
Pt called to report that she has had muscle aches and pains as well as GI upset and acid reflux from Zetia.  She stopped taking this 2 days ago and feels better now.  She has a TKA scheduled for later this month.  She wants to try something different but would prefer to wait until after her surgery.  She will call us once she is ambulating and able to come in for follow up.

## 2012-08-25 ENCOUNTER — Encounter (HOSPITAL_COMMUNITY)
Admission: RE | Admit: 2012-08-25 | Discharge: 2012-08-25 | Payer: Medicare Other | Source: Ambulatory Visit | Attending: Orthopedic Surgery | Admitting: Orthopedic Surgery

## 2012-08-25 NOTE — Telephone Encounter (Signed)
Left message on cell# for pt to return my call. 

## 2012-08-25 NOTE — Pre-Procedure Instructions (Signed)
[name]  08/25/2012   Your procedure is scheduled on:  Wednesday August 31, 2012  Report to [facility] at 6:30 AM.  Call this number if you have problems the morning of surgery: [phone]   Remember:   Do not eat food or drink :After Midnight.      Take these medicines the morning of surgery with A SIP OF WATER: albuterol, atenolol, qvar, clonazepam, premarin, allegra, flonase, norco, lamictal, singular, effexor   Do not wear jewelry, make-up or nail polish.  Do not wear lotions, powders, or perfumes.  Do not shave 48 hours prior to surgery. Men may shave face and neck.  Do not bring valuables to the hospital.  Contacts, dentures or bridgework may not be worn into surgery.  Leave suitcase in the car. After surgery it may be brought to your room.  For patients admitted to the hospital, checkout time is 11:00 AM the day of discharge.   Patients discharged the day of surgery will not be allowed to drive home.  Name and phone number of your driver: family / friend  Special Instructions: Incentive Spirometry - Practice and bring it with you on the day of surgery. Shower using CHG 2 nights before surgery and the night before surgery.  If you shower the day of surgery use CHG.  Use special wash - you have one bottle of CHG for all showers.  You should use approximately 1/3 of the bottle for each shower.   Please read over the following fact sheets that you were given: Pain Booklet, Coughing and Deep Breathing, Blood Transfusion Information, Total Joint Packet, MRSA Information and Surgical Site Infection Prevention

## 2012-08-26 ENCOUNTER — Telehealth: Payer: Self-pay

## 2012-08-26 MED ORDER — OMEPRAZOLE 40 MG PO CPDR: 40.0000 mg | DELAYED_RELEASE_CAPSULE | Freq: Every day | ORAL | Status: DC

## 2012-08-26 NOTE — Telephone Encounter (Signed)
Pt called back to see why she had been called and check on Rx I seen its been Okay by Beverely Low so I sent it over to pharmacy pt aware.      MW

## 2012-08-26 NOTE — Telephone Encounter (Signed)
Pt called back to see why she had been called and check on Rx I see its been Okay by Beverely Low so I sent it over to pharmacy.      MW

## 2012-08-29 ENCOUNTER — Encounter (HOSPITAL_COMMUNITY)
Admission: RE | Admit: 2012-08-29 | Discharge: 2012-08-29 | Disposition: A | Discharge status: Home / Self Care | Payer: Medicare Other | Source: Ambulatory Visit | Attending: Orthopedic Surgery | Admitting: Orthopedic Surgery

## 2012-08-29 ENCOUNTER — Other Ambulatory Visit (HOSPITAL_COMMUNITY): Payer: Self-pay | Admitting: Internal Medicine

## 2012-08-29 ENCOUNTER — Encounter (HOSPITAL_COMMUNITY): Payer: Self-pay

## 2012-08-29 HISTORY — DX: Cardiac murmur, unspecified: R01.1

## 2012-08-29 HISTORY — DX: Adverse effect of unspecified anesthetic, initial encounter: T41.45XA

## 2012-08-29 HISTORY — DX: Gastro-esophageal reflux disease without esophagitis: K21.9

## 2012-08-29 HISTORY — DX: Other constipation: K59.09

## 2012-08-29 HISTORY — DX: Other complications of anesthesia, initial encounter: T88.59XA

## 2012-08-29 HISTORY — DX: Pneumonia, unspecified organism: J18.9

## 2012-08-29 HISTORY — DX: Anxiety disorder, unspecified: F41.9

## 2012-08-29 LAB — COMPREHENSIVE METABOLIC PANEL
Alkaline Phosphatase: 37 U/L — ABNORMAL LOW (ref 39–117)
BUN: 17 mg/dL (ref 6–23)
Calcium: 10.5 mg/dL (ref 8.4–10.5)
GFR calc Af Amer: 87 mL/min — ABNORMAL LOW (ref >90)
GFR calc non Af Amer: 75 mL/min — ABNORMAL LOW (ref >90)
Glucose, Bld: 77 mg/dL (ref 70–99)
Total Protein: 7.8 g/dL (ref 6.0–8.3)

## 2012-08-29 LAB — URINALYSIS, ROUTINE W REFLEX MICROSCOPIC
Bilirubin Urine: NEGATIVE
Glucose, UA: NEGATIVE mg/dL
Hgb urine dipstick: NEGATIVE
Specific Gravity, Urine: 1.01 (ref 1.005–1.030)
Urobilinogen, UA: 0.2 mg/dL (ref 0.0–1.0)

## 2012-08-29 LAB — PROTIME-INR: Prothrombin Time: 14 seconds (ref 11.6–15.2)

## 2012-08-29 LAB — CBC
HCT: 38.5 % (ref 36.0–46.0)
Hemoglobin: 13.1 g/dL (ref 12.0–15.0)
MCH: 30.3 pg (ref 26.0–34.0)
MCHC: 34 g/dL (ref 30.0–36.0)
MCV: 88.9 fL (ref 78.0–100.0)

## 2012-08-29 LAB — SURGICAL PCR SCREEN: Staphylococcus aureus: NEGATIVE

## 2012-08-29 LAB — TYPE AND SCREEN
ABO/RH(D): A POS
Antibody Screen: NEGATIVE

## 2012-08-29 NOTE — Progress Notes (Addendum)
Patient denied having a stress test, cardiac cath, or sleep study. PC is Dr. Neena Rhymes.

## 2012-08-30 ENCOUNTER — Telehealth: Payer: Self-pay | Admitting: *Deleted

## 2012-08-30 MED ORDER — OMEPRAZOLE 40 MG PO CPDR: 40.0000 mg | DELAYED_RELEASE_CAPSULE | Freq: Every day | ORAL | Status: DC

## 2012-08-30 MED ORDER — CEFAZOLIN SODIUM-DEXTROSE 2-3 GM-% IV SOLR
2.0000 g | INTRAVENOUS | Status: AC
  Administered 2012-08-31: 2 g via INTRAVENOUS
  Filled 2012-08-30: qty 50

## 2012-08-30 NOTE — Telephone Encounter (Signed)
Noted incoming fax for pt RX for omeprazole, sent RX via escribe

## 2012-08-30 NOTE — H&P (Signed)
  [facility] [address]   [address] [place], [place] [address] [phone] A Division of [facility]  Loreta Ave, M.D.   Robert A. Thurston Hole, M.D.   Burnell Blanks, M.D.   Eulas Post, M.D.   Lunette Stands, M.D Buford Dresser, M.D.  Charlsie Quest, M.D.   Estell Harpin, M.D.   Melina Fiddler, M.D. Genene Churn. Barry Dienes, PA-C            Kirstin A. Shepperson, PA-C Josh Lake [place], PA-C [place], [place]   RE: [name], [name]   [id number]      DOB: 1956-08-16 PROGRESS NOTE: 08-19-12 Chief complaint: left knee pain. History of present illness: 56 year old white female with history of end stage degenerative joint disease and chronic pain. Symptoms are unchanged. She wants to proceed with total knee replacement as scheduled. She is status post right total knee replacement 2/13 and this is doing well.  Current medications: Fluticasone, Proair and Hydrocodone. Drug allergies: Xanax.  Past medical/surgical history: bipolar disorder asthma hypertension hysterectomy lumbar spine surgery tonsillectomy.  Family history: positive hypertension. Social history: she is single and retired. Admits occasional alcohol use. Denies smoking. Review of systems: all other systems unremarkable.  EXAMINATION: Height 5'8" weight 219 pounds. Blood pressure 123/75 pulse 68. Alert and oriented x3 in no acute distress. Head normal cephalic atraumatic. PERRLA Extraocular motion is intact. Cervical spine unremarkable. Lungs CTA bilaterally no wheezes noted. Heart regular rate and rhythm. S1 S2. Abdomen round non-distended. NBSx4 soft non-tender. Left knee decreased range of motion. Positive patellofemoral crepitus. Joint line tenderness. 1 to 2+ effusion. Ligaments stable. Bilateral calves non-tender neurovascularly intact. Skin warm and dry.  Right knee range of motion 0-125 degrees.  X-RAYS: Previous films left knee AP lateral sunrise show end stage  degenerative joint disease with periarticular spurs.  IMPRESSION: Left knee end stage degenerative joint disease with chronic pain. Failed conservative treatment.  DISPOSITION: We'll proceed with right total knee replacement as scheduled. Discussed risks benefits and possible complications in detail. All questions answered.  Immediately post-op we'll use low dose morphine PCA and wean to Percocet.  Loreta Ave, M.D.  Electronically verified by Loreta Ave, M.D. DFM(JMO):kh D 08-19-12 T 08-19-12

## 2012-08-31 ENCOUNTER — Encounter (HOSPITAL_COMMUNITY): Payer: Self-pay | Admitting: *Deleted

## 2012-08-31 ENCOUNTER — Encounter (HOSPITAL_COMMUNITY): Payer: Self-pay | Admitting: Vascular Surgery

## 2012-08-31 ENCOUNTER — Ambulatory Visit (HOSPITAL_COMMUNITY): Payer: Medicare Other | Admitting: Anesthesiology

## 2012-08-31 ENCOUNTER — Inpatient Hospital Stay (HOSPITAL_COMMUNITY)
Admission: RE | Admit: 2012-08-31 | Discharge: 2012-09-02 | DRG: 470 | Disposition: A | Discharge status: Home Health | Payer: Medicare Other | Source: Ambulatory Visit | Attending: Orthopedic Surgery | Admitting: Orthopedic Surgery

## 2012-08-31 ENCOUNTER — Inpatient Hospital Stay (HOSPITAL_COMMUNITY): Payer: Medicare Other

## 2012-08-31 ENCOUNTER — Encounter (HOSPITAL_COMMUNITY)
Admission: RE | Disposition: A | Discharge status: Home Health | Payer: Self-pay | Source: Ambulatory Visit | Attending: Orthopedic Surgery

## 2012-08-31 DIAGNOSIS — Z8249 Family history of ischemic heart disease and other diseases of the circulatory system: Secondary | ICD-10-CM

## 2012-08-31 DIAGNOSIS — F319 Bipolar disorder, unspecified: Secondary | ICD-10-CM | POA: Diagnosis present

## 2012-08-31 DIAGNOSIS — M1710 Unilateral primary osteoarthritis, unspecified knee: Principal | ICD-10-CM | POA: Diagnosis present

## 2012-08-31 DIAGNOSIS — Z96659 Presence of unspecified artificial knee joint: Secondary | ICD-10-CM

## 2012-08-31 DIAGNOSIS — Z01812 Encounter for preprocedural laboratory examination: Secondary | ICD-10-CM

## 2012-08-31 DIAGNOSIS — I10 Essential (primary) hypertension: Secondary | ICD-10-CM | POA: Diagnosis present

## 2012-08-31 DIAGNOSIS — Z23 Encounter for immunization: Secondary | ICD-10-CM

## 2012-08-31 DIAGNOSIS — J45909 Unspecified asthma, uncomplicated: Secondary | ICD-10-CM | POA: Diagnosis present

## 2012-08-31 HISTORY — PX: TOTAL KNEE ARTHROPLASTY: SHX125

## 2012-08-31 SURGERY — ARTHROPLASTY, KNEE, TOTAL
Anesthesia: General | Site: Knee | Laterality: Left | Wound class: Clean

## 2012-08-31 MED ORDER — BISACODYL 10 MG RE SUPP: 10.0000 mg | Freq: Every day | RECTAL | Status: DC | PRN

## 2012-08-31 MED ORDER — SENNOSIDES-DOCUSATE SODIUM 8.6-50 MG PO TABS: 1.0000 | ORAL_TABLET | Freq: Every evening | ORAL | Status: DC | PRN

## 2012-08-31 MED ORDER — DOCUSATE SODIUM 100 MG PO CAPS
100.0000 mg | ORAL_CAPSULE | Freq: Two times a day (BID) | ORAL | Status: DC
  Administered 2012-08-31 – 2012-09-02 (×4): 100 mg via ORAL
  Filled 2012-08-31 (×5): qty 1

## 2012-08-31 MED ORDER — VENLAFAXINE HCL ER 75 MG PO CP24
225.0000 mg | ORAL_CAPSULE | Freq: Every day | ORAL | Status: DC
  Administered 2012-09-01 – 2012-09-02 (×2): 225 mg via ORAL
  Filled 2012-08-31 (×2): qty 1

## 2012-08-31 MED ORDER — SODIUM CHLORIDE 0.9 % IJ SOLN: 9.0000 mL | INTRAMUSCULAR | Status: DC | PRN

## 2012-08-31 MED ORDER — MENTHOL 3 MG MT LOZG: 1.0000 | LOZENGE | OROMUCOSAL | Status: DC | PRN

## 2012-08-31 MED ORDER — FENOFIBRATE 160 MG PO TABS
160.0000 mg | ORAL_TABLET | Freq: Every day | ORAL | Status: DC
  Administered 2012-08-31 – 2012-09-02 (×3): 160 mg via ORAL
  Filled 2012-08-31 (×3): qty 1

## 2012-08-31 MED ORDER — BUPIVACAINE HCL (PF) 0.25 % IJ SOLN
INTRAMUSCULAR | Status: DC | PRN
  Administered 2012-08-31: 30 mL

## 2012-08-31 MED ORDER — QUETIAPINE FUMARATE 25 MG PO TABS
25.0000 mg | ORAL_TABLET | Freq: Four times a day (QID) | ORAL | Status: DC | PRN
  Administered 2012-08-31 – 2012-09-01 (×2): 25 mg via ORAL
  Filled 2012-08-31 (×2): qty 1

## 2012-08-31 MED ORDER — ACETAMINOPHEN 10 MG/ML IV SOLN: 1000.0000 mg | Freq: Once | INTRAVENOUS | Status: DC | PRN

## 2012-08-31 MED ORDER — CEFAZOLIN SODIUM 1-5 GM-% IV SOLN
1.0000 g | Freq: Three times a day (TID) | INTRAVENOUS | Status: AC
  Administered 2012-08-31 (×2): 1 g via INTRAVENOUS
  Filled 2012-08-31 (×2): qty 50

## 2012-08-31 MED ORDER — WARFARIN VIDEO: Freq: Once | Status: DC

## 2012-08-31 MED ORDER — METHOCARBAMOL 100 MG/ML IJ SOLN
500.0000 mg | Freq: Four times a day (QID) | INTRAVENOUS | Status: DC | PRN
  Filled 2012-08-31: qty 5

## 2012-08-31 MED ORDER — ATORVASTATIN CALCIUM 20 MG PO TABS
20.0000 mg | ORAL_TABLET | Freq: Every day | ORAL | Status: DC
  Filled 2012-08-31 (×3): qty 1

## 2012-08-31 MED ORDER — MORPHINE SULFATE (PF) 1 MG/ML IV SOLN
INTRAVENOUS | Status: AC
  Administered 2012-08-31: 5 mg
  Filled 2012-08-31: qty 25

## 2012-08-31 MED ORDER — LIDOCAINE HCL (CARDIAC) 20 MG/ML IV SOLN
INTRAVENOUS | Status: DC | PRN
  Administered 2012-08-31 (×2): 50 mg via INTRAVENOUS

## 2012-08-31 MED ORDER — ENOXAPARIN SODIUM 30 MG/0.3ML ~~LOC~~ SOLN
30.0000 mg | Freq: Two times a day (BID) | SUBCUTANEOUS | Status: DC
  Filled 2012-08-31: qty 0.3

## 2012-08-31 MED ORDER — DIPHENHYDRAMINE HCL 50 MG/ML IJ SOLN: 12.5000 mg | Freq: Four times a day (QID) | INTRAMUSCULAR | Status: DC | PRN

## 2012-08-31 MED ORDER — HYDROMORPHONE HCL PF 1 MG/ML IJ SOLN: 0.5000 mg | INTRAMUSCULAR | Status: DC | PRN

## 2012-08-31 MED ORDER — COUMADIN BOOK
Freq: Once | Status: AC
  Administered 2012-09-01: 08:00:00
  Filled 2012-08-31: qty 1

## 2012-08-31 MED ORDER — LORATADINE 10 MG PO TABS
10.0000 mg | ORAL_TABLET | Freq: Every day | ORAL | Status: DC
  Administered 2012-09-01 – 2012-09-02 (×2): 10 mg via ORAL
  Filled 2012-08-31 (×2): qty 1

## 2012-08-31 MED ORDER — PROPOFOL 10 MG/ML IV BOLUS
INTRAVENOUS | Status: DC | PRN
  Administered 2012-08-31: 170 mg via INTRAVENOUS

## 2012-08-31 MED ORDER — METHOCARBAMOL 500 MG PO TABS
500.0000 mg | ORAL_TABLET | Freq: Four times a day (QID) | ORAL | Status: DC | PRN
  Administered 2012-08-31 – 2012-09-02 (×4): 500 mg via ORAL
  Filled 2012-08-31 (×4): qty 1

## 2012-08-31 MED ORDER — FLUTICASONE PROPIONATE 50 MCG/ACT NA SUSP
2.0000 | Freq: Every day | NASAL | Status: DC
  Administered 2012-09-02: 2 via NASAL
  Filled 2012-08-31: qty 16

## 2012-08-31 MED ORDER — BUPIVACAINE HCL (PF) 0.25 % IJ SOLN
INTRAMUSCULAR | Status: AC
  Filled 2012-08-31: qty 30

## 2012-08-31 MED ORDER — ACETAMINOPHEN 325 MG PO TABS
650.0000 mg | ORAL_TABLET | Freq: Four times a day (QID) | ORAL | Status: DC | PRN
  Administered 2012-09-01 – 2012-09-02 (×2): 650 mg via ORAL
  Filled 2012-08-31 (×2): qty 2

## 2012-08-31 MED ORDER — ALBUTEROL SULFATE HFA 108 (90 BASE) MCG/ACT IN AERS
2.0000 | INHALATION_SPRAY | Freq: Four times a day (QID) | RESPIRATORY_TRACT | Status: DC | PRN
  Filled 2012-08-31: qty 6.7

## 2012-08-31 MED ORDER — ACETAMINOPHEN 650 MG RE SUPP: 650.0000 mg | Freq: Four times a day (QID) | RECTAL | Status: DC | PRN

## 2012-08-31 MED ORDER — NALOXONE HCL 0.4 MG/ML IJ SOLN: 0.4000 mg | INTRAMUSCULAR | Status: DC | PRN

## 2012-08-31 MED ORDER — ATENOLOL 50 MG PO TABS
50.0000 mg | ORAL_TABLET | Freq: Every day | ORAL | Status: DC
  Administered 2012-09-01 – 2012-09-02 (×2): 50 mg via ORAL
  Filled 2012-08-31 (×2): qty 1

## 2012-08-31 MED ORDER — ENOXAPARIN SODIUM 30 MG/0.3ML ~~LOC~~ SOLN
30.0000 mg | Freq: Two times a day (BID) | SUBCUTANEOUS | Status: DC
  Administered 2012-09-01 – 2012-09-02 (×4): 30 mg via SUBCUTANEOUS
  Filled 2012-08-31 (×5): qty 0.3

## 2012-08-31 MED ORDER — ONDANSETRON HCL 4 MG/2ML IJ SOLN: 4.0000 mg | Freq: Four times a day (QID) | INTRAMUSCULAR | Status: DC | PRN

## 2012-08-31 MED ORDER — CLONAZEPAM 1 MG PO TABS
1.0000 mg | ORAL_TABLET | Freq: Four times a day (QID) | ORAL | Status: DC | PRN
  Administered 2012-08-31 – 2012-09-01 (×2): 1 mg via ORAL
  Filled 2012-08-31 (×2): qty 1

## 2012-08-31 MED ORDER — DEXMEDETOMIDINE HCL IN NACL 200 MCG/50ML IV SOLN
0.4000 ug/kg/h | INTRAVENOUS | Status: AC
  Administered 2012-08-31: 0.4 ug/kg/h via INTRAVENOUS
  Filled 2012-08-31: qty 50

## 2012-08-31 MED ORDER — MIDAZOLAM HCL 5 MG/5ML IJ SOLN
INTRAMUSCULAR | Status: DC | PRN
  Administered 2012-08-31: 2 mg via INTRAVENOUS

## 2012-08-31 MED ORDER — METOCLOPRAMIDE HCL 5 MG/ML IJ SOLN: 5.0000 mg | Freq: Three times a day (TID) | INTRAMUSCULAR | Status: DC | PRN

## 2012-08-31 MED ORDER — FLUTICASONE PROPIONATE HFA 44 MCG/ACT IN AERO
1.0000 | INHALATION_SPRAY | Freq: Two times a day (BID) | RESPIRATORY_TRACT | Status: DC
  Administered 2012-08-31 – 2012-09-02 (×4): 1 via RESPIRATORY_TRACT
  Filled 2012-08-31: qty 10.6

## 2012-08-31 MED ORDER — INFLUENZA VIRUS VACC SPLIT PF IM SUSP
0.5000 mL | INTRAMUSCULAR | Status: AC
  Administered 2012-09-02: 0.5 mL via INTRAMUSCULAR
  Filled 2012-08-31: qty 0.5

## 2012-08-31 MED ORDER — ARTIFICIAL TEARS OP OINT
TOPICAL_OINTMENT | OPHTHALMIC | Status: DC | PRN
  Administered 2012-08-31: 1 via OPHTHALMIC

## 2012-08-31 MED ORDER — MORPHINE SULFATE 4 MG/ML IJ SOLN
INTRAMUSCULAR | Status: DC | PRN
  Administered 2012-08-31: 4 mg via INTRAVENOUS

## 2012-08-31 MED ORDER — DIPHENHYDRAMINE HCL 12.5 MG/5ML PO ELIX
12.5000 mg | ORAL_SOLUTION | Freq: Four times a day (QID) | ORAL | Status: DC | PRN
  Filled 2012-08-31: qty 5

## 2012-08-31 MED ORDER — PANTOPRAZOLE SODIUM 40 MG PO TBEC
40.0000 mg | DELAYED_RELEASE_TABLET | Freq: Every day | ORAL | Status: DC
  Administered 2012-09-01 – 2012-09-02 (×2): 40 mg via ORAL
  Filled 2012-08-31 (×2): qty 1

## 2012-08-31 MED ORDER — PHENOL 1.4 % MT LIQD: 1.0000 | OROMUCOSAL | Status: DC | PRN

## 2012-08-31 MED ORDER — FENTANYL CITRATE 0.05 MG/ML IJ SOLN
INTRAMUSCULAR | Status: DC | PRN
  Administered 2012-08-31 (×3): 50 ug via INTRAVENOUS

## 2012-08-31 MED ORDER — MORPHINE SULFATE 4 MG/ML IJ SOLN
INTRAMUSCULAR | Status: AC
  Filled 2012-08-31: qty 1

## 2012-08-31 MED ORDER — OXYCODONE-ACETAMINOPHEN 5-325 MG PO TABS
1.0000 | ORAL_TABLET | ORAL | Status: DC | PRN
  Administered 2012-08-31 – 2012-09-01 (×4): 2 via ORAL
  Filled 2012-08-31 (×4): qty 2

## 2012-08-31 MED ORDER — MONTELUKAST SODIUM 10 MG PO TABS
10.0000 mg | ORAL_TABLET | Freq: Every day | ORAL | Status: DC
  Administered 2012-08-31 – 2012-09-01 (×2): 10 mg via ORAL
  Filled 2012-08-31 (×3): qty 1

## 2012-08-31 MED ORDER — POTASSIUM CHLORIDE IN NACL 20-0.9 MEQ/L-% IV SOLN
INTRAVENOUS | Status: DC
  Administered 2012-08-31: 14:00:00 via INTRAVENOUS
  Filled 2012-08-31 (×6): qty 1000

## 2012-08-31 MED ORDER — METOCLOPRAMIDE HCL 10 MG PO TABS: 5.0000 mg | ORAL_TABLET | Freq: Three times a day (TID) | ORAL | Status: DC | PRN

## 2012-08-31 MED ORDER — PNEUMOCOCCAL VAC POLYVALENT 25 MCG/0.5ML IJ INJ
0.5000 mL | INJECTION | INTRAMUSCULAR | Status: AC
  Administered 2012-09-02: 0.5 mL via INTRAMUSCULAR
  Filled 2012-08-31: qty 0.5

## 2012-08-31 MED ORDER — LACTATED RINGERS IV SOLN
INTRAVENOUS | Status: DC | PRN
  Administered 2012-08-31 (×2): via INTRAVENOUS

## 2012-08-31 MED ORDER — MORPHINE SULFATE (PF) 1 MG/ML IV SOLN
INTRAVENOUS | Status: DC
  Administered 2012-08-31: 11:00:00 via INTRAVENOUS
  Administered 2012-08-31: 3 mg via INTRAVENOUS

## 2012-08-31 MED ORDER — HYDROMORPHONE HCL PF 1 MG/ML IJ SOLN: 0.2500 mg | INTRAMUSCULAR | Status: DC | PRN

## 2012-08-31 MED ORDER — ONDANSETRON HCL 4 MG/2ML IJ SOLN
INTRAMUSCULAR | Status: DC | PRN
  Administered 2012-08-31: 4 mg via INTRAVENOUS

## 2012-08-31 MED ORDER — ONDANSETRON HCL 4 MG/2ML IJ SOLN: 4.0000 mg | Freq: Once | INTRAMUSCULAR | Status: DC | PRN

## 2012-08-31 MED ORDER — QUETIAPINE FUMARATE ER 50 MG PO TB24
450.0000 mg | ORAL_TABLET | Freq: Every day | ORAL | Status: DC
  Administered 2012-08-31 – 2012-09-01 (×2): 450 mg via ORAL
  Filled 2012-08-31 (×3): qty 1

## 2012-08-31 MED ORDER — SODIUM CHLORIDE 0.9 % IR SOLN
Status: DC | PRN
  Administered 2012-08-31: 3000 mL

## 2012-08-31 MED ORDER — ONDANSETRON HCL 4 MG PO TABS: 4.0000 mg | ORAL_TABLET | Freq: Four times a day (QID) | ORAL | Status: DC | PRN

## 2012-08-31 SURGICAL SUPPLY — 55 items
BANDAGE ESMARK 6X9 LF (GAUZE/BANDAGES/DRESSINGS) ×1 IMPLANT
BLADE SAG 18X100X1.27 (BLADE) ×4 IMPLANT
BNDG ESMARK 6X9 LF (GAUZE/BANDAGES/DRESSINGS) ×2
BOOTCOVER CLEANROOM LRG (PROTECTIVE WEAR) ×4 IMPLANT
BOWL SMART MIX CTS (DISPOSABLE) ×2 IMPLANT
CEMENT BONE SIMPLEX SPEEDSET (Cement) ×4 IMPLANT
CLOTH BEACON ORANGE TIMEOUT ST (SAFETY) ×2 IMPLANT
COVER BACK TABLE 24X17X13 BIG (DRAPES) ×2 IMPLANT
COVER SURGICAL LIGHT HANDLE (MISCELLANEOUS) ×2 IMPLANT
CUFF TOURNIQUET SINGLE 34IN LL (TOURNIQUET CUFF) ×2 IMPLANT
DRAPE EXTREMITY T 121X128X90 (DRAPE) ×2 IMPLANT
DRAPE PROXIMA HALF (DRAPES) ×2 IMPLANT
DRAPE U-SHAPE 47X51 STRL (DRAPES) ×2 IMPLANT
DRSG PAD ABDOMINAL 8X10 ST (GAUZE/BANDAGES/DRESSINGS) ×2 IMPLANT
DURAPREP 26ML APPLICATOR (WOUND CARE) ×2 IMPLANT
ELECT CAUTERY BLADE 6.4 (BLADE) ×2 IMPLANT
ELECT REM PT RETURN 9FT ADLT (ELECTROSURGICAL) ×2
ELECTRODE REM PT RTRN 9FT ADLT (ELECTROSURGICAL) ×1 IMPLANT
EVACUATOR 1/8 PVC DRAIN (DRAIN) ×2 IMPLANT
FACESHIELD LNG OPTICON STERILE (SAFETY) ×2 IMPLANT
GAUZE XEROFORM 5X9 LF (GAUZE/BANDAGES/DRESSINGS) ×2 IMPLANT
GLOVE BIOGEL PI IND STRL 8 (GLOVE) ×1 IMPLANT
GLOVE BIOGEL PI INDICATOR 8 (GLOVE) ×1
GLOVE ORTHO TXT STRL SZ7.5 (GLOVE) ×2 IMPLANT
GOWN PREVENTION PLUS XLARGE (GOWN DISPOSABLE) ×4 IMPLANT
GOWN STRL NON-REIN LRG LVL3 (GOWN DISPOSABLE) ×4 IMPLANT
GOWN STRL REIN 2XL XLG LVL4 (GOWN DISPOSABLE) ×2 IMPLANT
HANDPIECE INTERPULSE COAX TIP (DISPOSABLE) ×1
IMMOBILIZER KNEE 22 UNIV (SOFTGOODS) ×2 IMPLANT
IMMOBILIZER KNEE 24 THIGH 36 (MISCELLANEOUS) IMPLANT
IMMOBILIZER KNEE 24 UNIV (MISCELLANEOUS)
KIT BASIN OR (CUSTOM PROCEDURE TRAY) ×2 IMPLANT
KIT ROOM TURNOVER OR (KITS) ×2 IMPLANT
MANIFOLD NEPTUNE II (INSTRUMENTS) ×2 IMPLANT
NS IRRIG 1000ML POUR BTL (IV SOLUTION) ×2 IMPLANT
PACK TOTAL JOINT (CUSTOM PROCEDURE TRAY) ×2 IMPLANT
PAD ARMBOARD 7.5X6 YLW CONV (MISCELLANEOUS) ×4 IMPLANT
PAD CAST 4YDX4 CTTN HI CHSV (CAST SUPPLIES) ×1 IMPLANT
PADDING CAST COTTON 4X4 STRL (CAST SUPPLIES) ×1
PADDING CAST COTTON 6X4 STRL (CAST SUPPLIES) ×2 IMPLANT
RUBBERBAND STERILE (MISCELLANEOUS) ×2 IMPLANT
SET HNDPC FAN SPRY TIP SCT (DISPOSABLE) ×1 IMPLANT
SPONGE GAUZE 4X4 12PLY (GAUZE/BANDAGES/DRESSINGS) ×2 IMPLANT
STAPLER VISISTAT 35W (STAPLE) ×2 IMPLANT
SUCTION FRAZIER TIP 10 FR DISP (SUCTIONS) ×2 IMPLANT
SUT VIC AB 1 CTX 36 (SUTURE) ×2
SUT VIC AB 1 CTX36XBRD ANBCTR (SUTURE) ×2 IMPLANT
SUT VIC AB 2-0 CT1 27 (SUTURE) ×2
SUT VIC AB 2-0 CT1 TAPERPNT 27 (SUTURE) ×2 IMPLANT
SYR 30ML LL (SYRINGE) ×2 IMPLANT
SYR 30ML SLIP (SYRINGE) ×2 IMPLANT
TOWEL OR 17X24 6PK STRL BLUE (TOWEL DISPOSABLE) ×2 IMPLANT
TOWEL OR 17X26 10 PK STRL BLUE (TOWEL DISPOSABLE) ×2 IMPLANT
TRAY FOLEY CATH 14FR (SET/KITS/TRAYS/PACK) ×2 IMPLANT
WATER STERILE IRR 1000ML POUR (IV SOLUTION) ×2 IMPLANT

## 2012-08-31 NOTE — Preoperative (Signed)
Beta Blockers   Reason not to administer Beta Blockers:Not Applicable 

## 2012-08-31 NOTE — Transfer of Care (Signed)
Immediate Anesthesia Transfer of Care Note  Patient: [name]  Procedure(s) Performed: Procedure(s) (LRB) with comments: TOTAL KNEE ARTHROPLASTY (Left)  Patient Location: PACU  Anesthesia Type: General  Level of Consciousness: awake and oriented  Airway & Oxygen Therapy: Patient Spontanous Breathing and Patient connected to nasal cannula oxygen  Post-op Assessment: Report given to PACU RN and Post -op Vital signs reviewed and stable  Post vital signs: Reviewed  Complications: No apparent anesthesia complications

## 2012-08-31 NOTE — Progress Notes (Signed)
ANTICOAGULATION CONSULT NOTE - Initial Consult  Pharmacy Consult for Coumadin Indication: VTE prophylaxis  Allergies  Allergen Reactions  . Nitrofurantoin Monohyd Macro Nausea Only    Severe headache  . Alprazolam   . Sulfa Antibiotics Nausea And Vomiting    Patient Measurements: Weight: 227 lb (102.967 kg)  Vital Signs: Temp: 97.7 F (36.5 C) (10/23 1130) Temp src: Oral (10/23 0701) BP: 101/61 mmHg (10/23 1130) Pulse Rate: 71  (10/23 1130)  Labs:  [facility] 08/29/12 1354  HGB 13.1  HCT 38.5  PLT 288  APTT 31  LABPROT 14.0  INR 1.09  HEPARINUNFRC --  CREATININE 0.86  CKTOTAL --  CKMB --  TROPONINI --    The CrCl is unknown because both a height and weight (above a minimum accepted value) are required for this calculation.   Medical History: Past Medical History  Diagnosis Date  . Bipolar disorder   . MVP (mitral valve prolapse)   . Hyperlipidemia   . Asthma   . Allergic rhinitis   . Arthritis     R knee, Left Knee  . Urinary tract infection     frequent, see Urololgist 12/16/2010  . Dysrhythmia     Palpations occ, takes Tenormin  . Constipation   . Complication of anesthesia     had bad anxiety after anesthesia  . Heart murmur   . Pneumonia   . Bronchitis     hx of  . Anxiety   . Constipation, chronic   . GERD (gastroesophageal reflux disease)   . Seasonal allergies     Medications:  Prescriptions prior to admission  Medication Sig Dispense Refill  . albuterol (PROVENTIL HFA;VENTOLIN HFA) 108 (90 BASE) MCG/ACT inhaler Inhale 2 puffs into the lungs every 6 (six) hours as needed. For shortness of breath      . atenolol (TENORMIN) 50 MG tablet Take 1 tablet (50 mg total) by mouth daily.  180 tablet  0  . beclomethasone (QVAR) 80 MCG/ACT inhaler Inhale 2 puffs into the lungs 2 (two) times daily.  1 Inhaler  3  . clonazePAM (KLONOPIN) 1 MG tablet Take 1 mg by mouth 4 (four) times daily as needed. For anxiety      . estrogens, conjugated, (PREMARIN)  0.625 MG tablet Take 0.625 mg by mouth daily.       . fenofibrate 160 MG tablet Take 1 tablet (160 mg total) by mouth daily.  90 tablet  1  . fexofenadine (ALLEGRA) 180 MG tablet Take 180 mg by mouth daily.      . fluticasone (FLONASE) 50 MCG/ACT nasal spray Place 2 sprays into the nose daily.  16 g  3  . HYDROcodone-acetaminophen (NORCO) 5-325 MG per tablet Take 1 tablet by mouth every 6 (six) hours as needed. For pain      . KRILL OIL 1000 MG CAPS Take 1,000 mg by mouth daily.       Marland Kitchen lamoTRIgine (LAMICTAL) 200 MG tablet Take 200 mg by mouth 2 (two) times daily.       . montelukast (SINGULAIR) 10 MG tablet Take 1 tablet (10 mg total) by mouth at bedtime.  30 tablet  3  . omeprazole (PRILOSEC) 40 MG capsule Take 1 capsule (40 mg total) by mouth daily.  30 capsule  3  . QUEtiapine (SEROQUEL XR) 300 MG 24 hr tablet Take 450 mg by mouth at bedtime.      Marland Kitchen QUEtiapine (SEROQUEL) 25 MG tablet Take 25 mg by mouth 4 (four) times daily  as needed. For anxiety      . rosuvastatin (CRESTOR) 10 MG tablet Take 1 tablet (10 mg total) by mouth at bedtime.  90 tablet  0  . venlafaxine (EFFEXOR-XR) 75 MG 24 hr capsule Take 225 mg by mouth daily.         Assessment: 17 YOF admitted w/ end stage DJD, now s/p left TKA.  Patient to start coumadin; baseline INR 1.09 Also noted on Lovenox 30 mg Q12.  Goal of Therapy:  INR 2-3 Monitor platelets by anticoagulation protocol: Yes   Plan: Coumadin 7.5 mg po x1 Daily INR Will begin education process  Bernadene Person PharmD Candidate  I have reviewed the above and agree with the plan.  Harland German, Pharm D 08/31/2012 1:05 PM

## 2012-08-31 NOTE — Anesthesia Preprocedure Evaluation (Addendum)
Anesthesia Evaluation  Patient identified by MRN, date of birth, ID band Patient awake    Reviewed: Allergy & Precautions, H&P , NPO status , Patient's Chart, lab work & pertinent test results  History of Anesthesia Complications (+) Emergence Delirium  Airway       Dental   Pulmonary  Regular inhaler use - Qvar for seasonal allergies. Albuterol past 2 days.         Cardiovascular + Valvular Problems/Murmurs MVP     Neuro/Psych Anxiety Depression Bipolar Disorder    GI/Hepatic GERD-  Medicated and Controlled,  Endo/Other    Renal/GU      Musculoskeletal   Abdominal   Peds  Hematology   Anesthesia Other Findings   Reproductive/Obstetrics                          Anesthesia Physical Anesthesia Plan  ASA: III  Anesthesia Plan: General   Post-op Pain Management:    Induction: Intravenous  Airway Management Planned: LMA  Additional Equipment:   Intra-op Plan:   Post-operative Plan:   Informed Consent: I have reviewed the patients History and Physical, chart, labs and discussed the procedure including the risks, benefits and alternatives for the proposed anesthesia with the patient or authorized representative who has indicated his/her understanding and acceptance.   Dental advisory given  Plan Discussed with: CRNA and Surgeon  Anesthesia Plan Comments:         Anesthesia Quick Evaluation

## 2012-08-31 NOTE — Progress Notes (Signed)
Orthopedic Tech Progress Note Patient Details:  [name] [birth year] [id number]  CPM Left Knee CPM Left Knee: On Left Knee Flexion (Degrees): 60  Left Knee Extension (Degrees): 0  Additional Comments: trapeze bar patient  helper   Nikki Dom 08/31/2012, 11:35 AM

## 2012-08-31 NOTE — Progress Notes (Signed)
UR COMPLETED  

## 2012-08-31 NOTE — Anesthesia Procedure Notes (Addendum)
Anesthesia Regional Block:  Femoral nerve block  Pre-Anesthetic Checklist: ,, timeout performed, Correct Patient, Correct Site, Correct Laterality, Correct Procedure, Correct Position, site marked, Risks and benefits discussed,  Surgical consent,  Pre-op evaluation,  At surgeon's request and post-op pain management  Laterality: Left  Prep: chloraprep       Needles:  Injection technique: Single-shot  Needle Type: Echogenic Stimulator Needle      Needle Gauge: 22 and 22 G    Additional Needles:  Procedures: ultrasound guided and nerve stimulator Femoral nerve block Narrative:  Start time: 08/31/2012 8:20 AM End time: 08/31/2012 8:25 AM  Performed by: Personally   Additional Notes: 30 cc 0.5% Marcaine with 1:200 Epi injected easily in 5 cc increments.  Kipp Brood, MD   Procedure Name: LMA Insertion Date/Time: 08/31/2012 8:49 AM Performed by: Lovie Chol Pre-anesthesia Checklist: Patient identified, Emergency Drugs available, Suction available, Patient being monitored and Timeout performed Patient Re-evaluated:Patient Re-evaluated prior to inductionOxygen Delivery Method: Circle system utilized Preoxygenation: Pre-oxygenation with 100% oxygen Intubation Type: IV induction Ventilation: Mask ventilation without difficulty LMA: LMA inserted LMA Size: 5.0 Number of attempts: 1 Placement Confirmation: ETT inserted through vocal cords under direct vision,  positive ETCO2 and breath sounds checked- equal and bilateral Tube secured with: Tape Dental Injury: Teeth and Oropharynx as per pre-operative assessment

## 2012-08-31 NOTE — Anesthesia Postprocedure Evaluation (Signed)
  Anesthesia Post-op Note  Patient: [name]  Procedure(s) Performed: Procedure(s) (LRB) with comments: TOTAL KNEE ARTHROPLASTY (Left)  Patient Location: PACU  Anesthesia Type: General and GA combined with regional for post-op pain  Level of Consciousness: awake, alert  and oriented  Airway and Oxygen Therapy: Patient Spontanous Breathing and Patient connected to nasal cannula oxygen  Post-op Pain: mild  Post-op Assessment: Post-op Vital signs reviewed and Patient's Cardiovascular Status Stable  Post-op Vital Signs: stable  Complications: No apparent anesthesia complications

## 2012-08-31 NOTE — Progress Notes (Signed)
Orthopedic Tech Progress Note Patient Details:  [name] 02-20-1956 [id number]  Patient ID: [name], female   DOB: [birth year], 56 y.o.   MRN: [id number] Viewed order from rn order list  Nikki Dom 08/31/2012, 11:35 AM

## 2012-08-31 NOTE — Brief Op Note (Signed)
08/31/2012  12:05 PM  PATIENT:  [name]  56 y.o. female  PRE-OPERATIVE DIAGNOSIS:  END STAGE DEGENERATIVE JOINT  POST-OPERATIVE DIAGNOSIS:  END STAGE DEGENERATIVE JOINT  PROCEDURE:  Procedure(s) (LRB) with comments: TOTAL KNEE ARTHROPLASTY (Left)  SURGEON:  Surgeon(s) and Role:    * Loreta Ave, MD - Primary  PHYSICIAN ASSISTANT: Junnie Loschiavo M  ANESTHESIA:   regional and general  EBL:  Total I/O In: 1600 [I.V.:1600] Out: 350 [Urine:250; Blood:100]   SPECIMEN:  No Specimen  DISPOSITION OF SPECIMEN:  N/A  COUNTS:  YES  TOURNIQUET:   Total Tourniquet Time Documented: Thigh (Left) - 72 minutes   PATIENT DISPOSITION:  PACU - hemodynamically stable.

## 2012-08-31 NOTE — Interval H&P Note (Signed)
History and Physical Interval Note:  08/31/2012 8:21 AM  [name]  has presented today for surgery, with the diagnosis of END STAGE DEGENERATIVE JOINT  The various methods of treatment have been discussed with the patient and family. After consideration of risks, benefits and other options for treatment, the patient has consented to  Procedure(s) (LRB) with comments: TOTAL KNEE ARTHROPLASTY (Left) as a surgical intervention .  The patient's history has been reviewed, patient examined, no change in status, stable for surgery.  I have reviewed the patient's chart and labs.  Questions were answered to the patient's satisfaction.     Shauntee Karp F

## 2012-09-01 ENCOUNTER — Encounter (HOSPITAL_COMMUNITY): Payer: Self-pay | Admitting: General Practice

## 2012-09-01 LAB — BASIC METABOLIC PANEL
BUN: 11 mg/dL (ref 6–23)
CO2: 28 mEq/L (ref 19–32)
Chloride: 101 mEq/L (ref 96–112)
Creatinine, Ser: 0.68 mg/dL (ref 0.50–1.10)

## 2012-09-01 LAB — CBC
HCT: 31.7 % — ABNORMAL LOW (ref 36.0–46.0)
Hemoglobin: 10.4 g/dL — ABNORMAL LOW (ref 12.0–15.0)
MCV: 90.6 fL (ref 78.0–100.0)
RBC: 3.5 MIL/uL — ABNORMAL LOW (ref 3.87–5.11)
WBC: 4.5 10*3/uL (ref 4.0–10.5)

## 2012-09-01 MED ORDER — OXYCODONE HCL 5 MG PO TABS
5.0000 mg | ORAL_TABLET | ORAL | Status: DC | PRN
  Administered 2012-09-01: 10 mg via ORAL
  Administered 2012-09-01 – 2012-09-02 (×4): 5 mg via ORAL
  Filled 2012-09-01: qty 1
  Filled 2012-09-01 (×2): qty 2
  Filled 2012-09-01 (×2): qty 1

## 2012-09-01 MED ORDER — OXYCODONE-ACETAMINOPHEN 5-325 MG PO TABS
1.0000 | ORAL_TABLET | ORAL | Status: DC | PRN
  Administered 2012-09-01: 2 via ORAL
  Administered 2012-09-02: 1 via ORAL

## 2012-09-01 MED ORDER — OXYCODONE-ACETAMINOPHEN 5-325 MG PO TABS
1.0000 | ORAL_TABLET | ORAL | Status: DC | PRN
  Administered 2012-09-01 (×2): 1 via ORAL
  Filled 2012-09-01: qty 1
  Filled 2012-09-01 (×2): qty 2
  Filled 2012-09-01: qty 1

## 2012-09-01 MED ORDER — WARFARIN SODIUM 7.5 MG PO TABS
7.5000 mg | ORAL_TABLET | Freq: Once | ORAL | Status: AC
  Administered 2012-09-01: 7.5 mg via ORAL
  Filled 2012-09-01: qty 1

## 2012-09-01 MED ORDER — WARFARIN - PHARMACIST DOSING INPATIENT: Freq: Every day | Status: DC

## 2012-09-01 MED ORDER — OXYCODONE HCL 5 MG PO TABS: 2.5000 mg | ORAL_TABLET | ORAL | Status: DC | PRN

## 2012-09-01 NOTE — Progress Notes (Signed)
Referral received for SNF. Chart reviewed and CSW has spoken with RNCM who indicates that patient is for DC to home with Home Health and DME.  CSW to sign off. Please re-consult if CSW needs arise.  Jalilah Wiltsie T. Zailee Vallely, LCSWA  [phone]  

## 2012-09-01 NOTE — Evaluation (Signed)
Occupational Therapy Evaluation Patient Details Name: [name] MRN: [id number] DOB: Oct 14, 1956 Today's Date: 09/01/2012 Time: 4098-1191 OT Time Calculation (min): 42 min  OT Assessment / Plan / Recommendation Clinical Impression  56 yo female s/p LT TKA that coudl benefit from skilled OT acutely.     OT Assessment  Patient needs continued OT Services    Follow Up Recommendations  No OT follow up    Barriers to Discharge      Equipment Recommendations  None recommended by OT    Recommendations for Other Services    Frequency  Min 2X/week    Precautions / Restrictions Precautions Precautions: Knee Required Braces or Orthoses: Knee Immobilizer - Left Restrictions LLE Weight Bearing: Weight bearing as tolerated   Pertinent Vitals/Pain 10 out 10 Rn called to room to provide pain medication Pt provided ice on LT TKA at end of session to (A) with pain management    ADL  Eating/Feeding: Performed;Independent Where Assessed - Eating/Feeding: Chair Grooming: Performed;Wash/dry hands;Wash/dry face;Teeth care;Brushing hair;Modified independent Where Assessed - Grooming: Unsupported standing Toilet Transfer: Performed;Minimal assistance Toilet Transfer Method: Sit to stand Toilet Transfer Equipment: Raised toilet seat with arms (or 3-in-1 over toilet) Toileting - Clothing Manipulation and Hygiene: Performed;Minimal assistance Where Assessed - Engineer, mining and Hygiene: Sit to stand from 3-in-1 or toilet Equipment Used: Gait belt;Knee Immobilizer;Rolling walker Transfers/Ambulation Related to ADLs: Pt required MOd v/c for sequence with RW. Using teach back method Ot had patient verbalize sequence aloud to increase return demo. Pt ambulated Min guard to bathroom ADL Comments: Pt completed bed mobility, basic transfer , toilet transfer and groomign at sink level.  Pt progressing well and will need LB adl education with tub transfer prior to d/c    OT Diagnosis:  Acute pain  OT Problem List: Decreased activity tolerance;Impaired balance (sitting and/or standing);Decreased safety awareness;Decreased knowledge of use of DME or AE;Decreased knowledge of precautions;Pain OT Treatment Interventions: Self-care/ADL training;Therapeutic exercise;DME and/or AE instruction;Therapeutic activities;Balance training;Patient/family education   OT Goals Acute Rehab OT Goals OT Goal Formulation: With patient Time For Goal Achievement: 09/15/12 Potential to Achieve Goals: Good ADL Goals Pt Will Perform Lower Body Bathing: with modified independence;Sit to stand from chair;with adaptive equipment ADL Goal: Lower Body Bathing - Progress: Goal set today Pt Will Perform Lower Body Dressing: with modified independence;Sit to stand from chair;with adaptive equipment ADL Goal: Lower Body Dressing - Progress: Goal set today Pt Will Transfer to Toilet: with modified independence;with DME;3-in-1 ADL Goal: Toilet Transfer - Progress: Goal set today Pt Will Perform Tub/Shower Transfer: Tub transfer;with supervision;Ambulation;with DME;Transfer tub bench ADL Goal: Tub/Shower Transfer - Progress: Goal set today  Visit Information  Last OT Received On: 09/01/12 Assistance Needed: +1    Subjective Data  Subjective: "I had my right one done in February on the 15th and this one hurts more" Patient Stated Goal: to go home with cousin (A) - got it all planned out this time   Prior Functioning     Home Living Lives With: Other (Comment) (cousin to (A) for 2 weeks) Available Help at Discharge: Available 24 hours/day Type of Home: Other (Comment) (townhome) Home Access: Level entry Home Layout: One level Bathroom Shower/Tub: Tub/shower unit;Curtain Firefighter: Standard (with 3n1) Bathroom Accessibility: Yes How Accessible: Accessible via walker Home Adaptive Equipment: Bedside commode/3-in-1;Walker - rolling;Tub transfer bench Prior Function Level of Independence:  Independent Able to Take Stairs?: Yes Driving: Yes Vocation: Retired Musician: No difficulties Dominant Hand: Right  Vision/Perception     Cognition  Overall Cognitive Status: Appears within functional limits for tasks assessed/performed Arousal/Alertness: Awake/alert Orientation Level: Appears intact for tasks assessed Behavior During Session: [facility] for tasks performed    Extremity/Trunk Assessment Right Upper Extremity Assessment RUE ROM/Strength/Tone: Within functional levels Left Upper Extremity Assessment LUE ROM/Strength/Tone: Within functional levels Trunk Assessment Trunk Assessment: Normal     Mobility Bed Mobility Bed Mobility: Supine to Sit;Sitting - Scoot to Edge of Bed Supine to Sit: 4: Min guard;HOB flat Sitting - Scoot to [place] of Bed: 4: Min guard Details for Bed Mobility Assistance: WFL - just extended time due to first attempt s/p surg LT TKA Transfers Transfers: Sit to Stand;Stand to Sit Sit to Stand: 4: Min assist;From elevated surface;With upper extremity assist;From bed Stand to Sit: 4: Min assist;With upper extremity assist;To chair/3-in-1 Details for Transfer Assistance: min v/c for Lt le extension prior to sitting and hand placement     Shoulder Instructions     Exercise     Balance     End of Session OT - End of Session Activity Tolerance: Patient tolerated treatment well Patient left: in chair;with call bell/phone within reach Nurse Communication: Mobility status;Precautions  GO     Lucile Shutters 09/01/2012, 10:27 AM Pager: [phone]

## 2012-09-01 NOTE — Progress Notes (Signed)
CARE MANAGEMENT NOTE 09/01/2012  Patient:  [name], [name]   Account Number:  [id number]  Date Initiated:  09/01/2012  Documentation initiated by:  Vance Peper  Subjective/Objective Assessment:   56 yr old female s/p left total knee arthroplasty.     Action/Plan:   CM spoke with patient concerning home health and DME needs. Patient preoperatively setup with [facility], no changes.CPM to be delivered to home, Patient has rolling walker and 3in1.   Anticipated DC Date:  09/02/2012   Anticipated DC Plan:  HOME W HOME HEALTH SERVICES      DC Planning Services  CM consult      [facility] Choice  HOME HEALTH   Choice offered to / List presented to:  C-1 Patient        HH arranged  HH-1 RN  HH-2 PT      [facility] agency  [facility]   Status of service:  Completed, signed off Medicare Important Message given?   (If response is "NO", the following Medicare IM given date fields will be blank) Date Medicare IM given:   Date Additional Medicare IM given:    Discharge Disposition:  HOME W HOME HEALTH SERVICES  Per UR Regulation:    If discussed at Long Length of Stay Meetings, dates discussed:    Comments:

## 2012-09-01 NOTE — Op Note (Signed)
NAMESHYENNE, MAGGARD                 ACCOUNT NO.:  [id number]  MEDICAL RECORD NO.:  [id number]  LOCATION:  5N28C                        FACILITY:  [facility]  PHYSICIAN:  Loreta Ave, M.D. DATE OF BIRTH:  1956/04/26  DATE OF PROCEDURE:  08/31/2012 DATE OF DISCHARGE:                              OPERATIVE REPORT   PREOPERATIVE DIAGNOSIS:  Left knee end-stage degenerative arthritis, varus alignment.  POSTOPERATIVE DIAGNOSIS:  Left knee end-stage degenerative arthritis, varus alignment.  PROCEDURE:  Left knee modified minimally invasive total knee replacement, Stryker triathlon prosthesis.  Soft tissue balancing. Cemented pegged posterior stabilized #3 femoral component.  Cemented #4 tibial component, 9 mm polyethylene insert.  Cemented resurfacing 32 mm patellar component.  SURGEON:  Loreta Ave, M.D.  ASSISTANT:  Genene Churn. Barry Dienes, [place], present throughout the entire case and necessary for timely completion of procedure.  ANESTHESIA:  General.  BLOOD LOSS:  Minimal.  SPECIMENS:  None.  CULTURES:  None.  COMPLICATIONS:  None.  DRESSINGS:  Soft compressive knee immobilizer.  DRAINS:  Hemovac x1.  TOURNIQUET TIME:  45 minutes.  PROCEDURE:  The patient was brought to the operating room, placed on the operating table in supine position.  After adequate anesthesia had been obtained, tourniquet applied.  Prepped and draped in usual sterile fashion.  Exsanguinated with elevation, Esmarch.  Tourniquet inflated to 350 mmHg.  Just full extension, 5 degrees of varus partially correctable.  Flexion better than 100 degrees.  Anterior incision patella tibial tubercle up above the patella.  Skin and subcutaneous tissue divided.  Medial arthrotomy, vastus splitting, preserving quad tendon.  Knee exposed.  Medial capsule release.  Grade 4 change throughout most marked medially.  Remnants of menisci, cruciate ligaments, periarticular spurs removed.  Distal femur  exposed. Intramedullary guide placed.  10-mm resection, 5 degrees of valgus. Using epicondylar axis, femur was initially sized and cut for a #4 component, which turned out to be too big from front to back.  I downsized it to a #3 component, which fit much better.  Appropriate jigs for all the cuts for the posterior stabilized components with pegs. Proximal tibial resection extramedullary guide.  A 3-degree posterior slope cut.  Size #4 component.  Knee was nicely balanced in flexion/extension.  Debris cleared throughout including posterior recess.  Patella exposed.  Posterior 10 mm removed.  Drilled, sized, and fitted for a 32 mm component.  Trials put in place.  #3 on the femur #4 on the tibia, 9 mm insert, and a 32 patella.  With this construct, excellent biomechanical axis.  Good patellar tracking.  Nicely balanced in flexion extension.  Full motion.  Tibia was marked for rotation and hand reamed.  All trials had been removed.  Copious irrigation with pulse irrigating device.  Cement prepared, placed on all components, firmly seated.  Polyethylene attached to tibia, knee reduced.  Patella held with a clamp.  Once cement hardened, knee was once again irrigated. Hemovac was placed and brought out through a separate stab wound. Arthrotomy closed with #1 Vicryl.  Skin and subcutaneous tissue with Vicryl and staples.  Sterile compressive dressing applied.  Tourniquet deflated and removed.  Knee immobilizer applied.  Anesthesia reversed.  Brought to the recovery room.  Tolerated surgery well.  No complications.     Loreta Ave, M.D.     DFM/MEDQ  D:  08/31/2012  T:  09/01/2012  Job:  [id number]

## 2012-09-01 NOTE — Progress Notes (Signed)
Physical Therapy Treatment Patient Details Name: [name] MRN: [id number] DOB: December 01, 1955 Today's Date: 09/01/2012 Time: 1478-2956 PT Time Calculation (min): 38 min  PT Assessment / Plan / Recommendation Comments on Treatment Session  Pt making excellent progress should meet all acute PT goals tomorrow.     Follow Up Recommendations  Home health PT;Supervision - Intermittent     Does the patient have the potential to tolerate intense rehabilitation     Barriers to Discharge        Equipment Recommendations  None recommended by PT    Recommendations for Other Services    Frequency 7X/week   Plan Discharge plan remains appropriate;Frequency remains appropriate    Precautions / Restrictions Precautions Precautions: Knee Required Braces or Orthoses: Knee Immobilizer - Left Restrictions LLE Weight Bearing: Weight bearing as tolerated   Pertinent Vitals/Pain 4/10 L knee.  Premedicated.     Mobility  Bed Mobility Bed Mobility: Sit to Supine;Sitting - Scoot to Edge of Bed;Supine to Sit Supine to Sit: 5: Supervision Sitting - Scoot to Edge of Bed: 5: Supervision Sit to Supine: 5: Supervision Details for Bed Mobility Assistance: Pt able to manage L LE without assistance.  Required extra time and effort to lift leg into bed.  Transfers Transfers: Sit to Stand;Stand to Sit Sit to Stand: 5: Supervision;With armrests;From bed Stand to Sit: 5: Supervision;To bed;With upper extremity assist Details for Transfer Assistance: Supervision for safety.  Ambulation/Gait Ambulation/Gait Assistance: 5: Supervision Ambulation Distance (Feet): 150 Feet Assistive device: Rolling walker Ambulation/Gait Assistance Details: Instructed pt in proper sequencing and walker management for reciprocal gait  Gait Pattern: Step-through pattern Gait velocity: WFL  Stairs: No Wheelchair Mobility Wheelchair Mobility: No    Exercises Total Joint Exercises Ankle Circles/Pumps: Both;10  reps;Seated Quad Sets: Left;10 reps;Seated Short Arc Quad: Left;10 reps;Supine;AAROM;AROM (5 AA and 5 active) Heel Slides: 10 reps;Left;AROM;Supine Straight Leg Raises: Left;5 reps;Supine   PT Diagnosis:    PT Problem List:   PT Treatment Interventions:     PT Goals Acute Rehab PT Goals PT Goal Formulation: With patient Time For Goal Achievement: 09/08/12 Potential to Achieve Goals: Good Pt will go Supine/Side to Sit: with modified independence PT Goal: Supine/Side to Sit - Progress: Progressing toward goal Pt will go Sit to Supine/Side: with modified independence PT Goal: Sit to Supine/Side - Progress: Progressing toward goal Pt will Transfer Bed to Chair/Chair to Bed: with modified independence PT Transfer Goal: Bed to Chair/Chair to Bed - Progress: Progressing toward goal Pt will Ambulate: >150 feet;with modified independence;with rolling walker PT Goal: Ambulate - Progress: Progressing toward goal Pt will Perform Home Exercise Program: Independently PT Goal: Perform Home Exercise Program - Progress: Progressing toward goal  Visit Information  Last PT Received On: 09/01/12 Assistance Needed: +1    Subjective Data  Subjective: I feel pretty good.  Patient Stated Goal: Walk without pain.    Cognition  Overall Cognitive Status: Appears within functional limits for tasks assessed/performed Arousal/Alertness: Awake/alert Orientation Level: Appears intact for tasks assessed Behavior During Session: [facility] for tasks performed    Balance     End of Session PT - End of Session Equipment Utilized During Treatment: Gait belt   GP     Harlym Gehling 09/01/2012, 5:13 PM Shaydon Lease L. Shandon Matson DPT [phone]

## 2012-09-01 NOTE — Progress Notes (Signed)
Subjective: Still c/o knee pain, but improved.     Objective: Vital signs in last 24 hours: Temp:  [97.6 F (36.4 C)-97.7 F (36.5 C)] 97.7 F (36.5 C) (10/24 0651) Pulse Rate:  [70-77] 74  (10/24 0651) Resp:  [9-18] 18  (10/24 0651) BP: (95-115)/(55-68) 115/68 mmHg (10/24 0651) SpO2:  [93 %-100 %] 100 % (10/24 0651) Weight:  [103 kg (227 lb 1.2 oz)] 103 kg (227 lb 1.2 oz) (10/23 1700)  Intake/Output from previous day: 10/23 0701 - 10/24 0700 In: 2570.8 [I.V.:2520.8; IV Piggyback:50] Out: 975 [Urine:500; Drains:375; Blood:100] Intake/Output this shift: Total I/O In: 240 [P.O.:240] Out: -    Basename 09/01/12 0540 08/29/12 1354  HGB 10.4* 13.1    Basename 09/01/12 0540 08/29/12 1354  WBC 4.5 6.1  RBC 3.50* 4.33  HCT 31.7* 38.5  PLT 176 288    Basename 09/01/12 0540 08/29/12 1354  NA 137 139  K 3.7 4.7  CL 101 99  CO2 28 27  BUN 11 17  CREATININE 0.68 0.86  GLUCOSE 105* 77  CALCIUM 8.4 10.5    Basename 09/01/12 0540 08/29/12 1354  LABPT -- --  INR 1.14 1.09    Exam:  Dressing c/d/i.  Calf nt, nvi.    Assessment/Plan: Start PT.  Change percocet to 7.5/325.  Anticipate d/c home Friday or Saturday.     Usman Millett M 09/01/2012, 10:42 AM

## 2012-09-01 NOTE — Evaluation (Signed)
Physical Therapy Evaluation Patient Details Name: [name] MRN: [id number] DOB: Apr 04, 1956 Today's Date: 09/01/2012 Time: 8657-8469 PT Time Calculation (min): 29 min  PT Assessment / Plan / Recommendation Clinical Impression  Pt is a 56 y/o female s/p L TKA.  Pt doing well with mobility and should be able to d/c home with HHPT.  Acute PT to follow pt to progress functional mobility.      PT Assessment  Patient needs continued PT services    Follow Up Recommendations  Home health PT;Supervision - Intermittent    Does the patient have the potential to tolerate intense rehabilitation      Barriers to Discharge        Equipment Recommendations  None recommended by PT    Recommendations for Other Services     Frequency 7X/week    Precautions / Restrictions Precautions Precautions: Knee Required Braces or Orthoses: Knee Immobilizer - Left Restrictions LLE Weight Bearing: Weight bearing as tolerated   Pertinent Vitals/Pain Pt reporting pain in knee 8/10 repositioned knee and performed A-P joint mobs on knee with pt reporting relief in pain 5/10.        Mobility  Bed Mobility Bed Mobility: Not assessed Supine to Sit: 4: Min guard;HOB flat Sitting - Scoot to Edge of Bed: 4: Min guard Details for Bed Mobility Assistance: WFL - just extended time due to first attempt s/p surg LT TKA Transfers Transfers: Sit to Stand;Stand to Sit Sit to Stand: 4: Min guard;From chair/3-in-1;With upper extremity assist Stand to Sit: 4: Min guard;To chair/3-in-1;With upper extremity assist;With armrests Details for Transfer Assistance: Verbal and visual cues for technique.  Ambulation/Gait Ambulation/Gait Assistance: 4: Min guard Ambulation Distance (Feet): 100 Feet Assistive device: Rolling walker Ambulation/Gait Assistance Details: Cues for sequencing and to decrease distance from walker.  Gait Pattern: Step-to pattern Stairs: No Wheelchair Mobility Wheelchair Mobility: No      Shoulder Instructions     Exercises Total Joint Exercises Ankle Circles/Pumps: Both;10 reps;Seated Quad Sets: Left;10 reps;Seated Goniometric ROM: 6-82 degrees of AAROM in L Knee   PT Diagnosis: Abnormality of gait;Acute pain  PT Problem List: Decreased strength;Decreased range of motion;Decreased activity tolerance;Decreased mobility;Pain;Obesity PT Treatment Interventions: Gait training;Stair training;DME instruction;Functional mobility training;Therapeutic activities;Therapeutic exercise;Neuromuscular re-education;Patient/family education   PT Goals Acute Rehab PT Goals PT Goal Formulation: With patient Time For Goal Achievement: 09/08/12 Potential to Achieve Goals: Good Pt will go Supine/Side to Sit: with modified independence PT Goal: Supine/Side to Sit - Progress: Goal set today Pt will go Sit to Supine/Side: with modified independence PT Goal: Sit to Supine/Side - Progress: Goal set today Pt will Transfer Bed to Chair/Chair to Bed: with modified independence PT Transfer Goal: Bed to Chair/Chair to Bed - Progress: Goal set today Pt will Ambulate: >150 feet;with modified independence;with rolling walker PT Goal: Ambulate - Progress: Goal set today Pt will Perform Home Exercise Program: Independently PT Goal: Perform Home Exercise Program - Progress: Goal set today  Visit Information  Last PT Received On: 09/01/12 Assistance Needed: +1    Subjective Data  Subjective: agree to PT eval.    Prior Functioning  Home Living Lives With: Other (Comment) (cousin to (A) for 2 weeks) Available Help at Discharge: Available 24 hours/day Type of Home: Other (Comment) (townhome) Home Access: Level entry Home Layout: One level Bathroom Shower/Tub: Tub/shower unit;Curtain Firefighter: Standard (with 3n1) Bathroom Accessibility: Yes How Accessible: Accessible via walker Home Adaptive Equipment: Bedside commode/3-in-1;Walker - rolling;Tub transfer bench Prior Function Level of  Independence: Independent  Able to Take Stairs?: Yes Driving: Yes Vocation: Retired Musician: No difficulties Dominant Hand: Right    Cognition  Overall Cognitive Status: Appears within functional limits for tasks assessed/performed Arousal/Alertness: Awake/alert Orientation Level: Appears intact for tasks assessed Behavior During Session: [facility] for tasks performed    Extremity/Trunk Assessment Right Upper Extremity Assessment RUE ROM/Strength/Tone: Within functional levels Left Upper Extremity Assessment LUE ROM/Strength/Tone: Within functional levels Right Lower Extremity Assessment RLE ROM/Strength/Tone: Within functional levels Left Lower Extremity Assessment LLE ROM/Strength/Tone: Unable to fully assess Trunk Assessment Trunk Assessment: Normal   Balance    End of Session PT - End of Session Equipment Utilized During Treatment: Gait belt CPM Left Knee CPM Left Knee: Off  GP     Lexany Belknap 09/01/2012, 12:32 PM  Koral Thaden L. Gittel Mccamish DPT [phone]

## 2012-09-02 ENCOUNTER — Other Ambulatory Visit: Payer: Self-pay | Admitting: *Deleted

## 2012-09-02 ENCOUNTER — Encounter: Payer: Self-pay | Admitting: Family Medicine

## 2012-09-02 DIAGNOSIS — K219 Gastro-esophageal reflux disease without esophagitis: Secondary | ICD-10-CM | POA: Insufficient documentation

## 2012-09-02 LAB — CBC
HCT: 29.9 % — ABNORMAL LOW (ref 36.0–46.0)
MCH: 30.1 pg (ref 26.0–34.0)
MCV: 89.3 fL (ref 78.0–100.0)
Platelets: 175 10*3/uL (ref 150–400)
RBC: 3.35 MIL/uL — ABNORMAL LOW (ref 3.87–5.11)
WBC: 4.5 10*3/uL (ref 4.0–10.5)

## 2012-09-02 LAB — BASIC METABOLIC PANEL
BUN: 8 mg/dL (ref 6–23)
CO2: 28 mEq/L (ref 19–32)
Calcium: 8.5 mg/dL (ref 8.4–10.5)
Chloride: 101 mEq/L (ref 96–112)
Creatinine, Ser: 0.73 mg/dL (ref 0.50–1.10)

## 2012-09-02 MED ORDER — OXYCODONE-ACETAMINOPHEN 7.5-325 MG PO TABS: 1.0000 | ORAL_TABLET | ORAL | Status: DC | PRN

## 2012-09-02 MED ORDER — WARFARIN SODIUM 7.5 MG PO TABS
7.5000 mg | ORAL_TABLET | Freq: Once | ORAL | Status: DC
  Filled 2012-09-02: qty 1

## 2012-09-02 MED ORDER — WARFARIN SODIUM 5 MG PO TABS: 5.0000 mg | ORAL_TABLET | Freq: Every day | ORAL | Status: DC

## 2012-09-02 MED ORDER — METHOCARBAMOL 500 MG PO TABS: 500.0000 mg | ORAL_TABLET | Freq: Four times a day (QID) | ORAL | Status: DC | PRN

## 2012-09-02 MED ORDER — ENOXAPARIN SODIUM 30 MG/0.3ML ~~LOC~~ SOLN: 30.0000 mg | Freq: Two times a day (BID) | SUBCUTANEOUS | Status: DC

## 2012-09-02 NOTE — Progress Notes (Signed)
ANTICOAGULATION CONSULT NOTE - Follow up Consult  Pharmacy Consult for Coumadin Indication: VTE prophylaxis  Allergies  Allergen Reactions  . Nitrofurantoin Monohyd Macro Nausea Only    Severe headache  . Alprazolam   . Sulfa Antibiotics Nausea And Vomiting    Patient Measurements: Height: 5' 8.9" (175 cm) Weight: 227 lb 1.2 oz (103 kg) IBW/kg (Calculated) : 65.97   Vital Signs: Temp: 98.6 F (37 C) (10/25 0611) Temp src: Oral (10/25 0611) BP: 93/50 mmHg (10/25 0611) Pulse Rate: 75  (10/25 0907)  Labs:  Basename 09/02/12 0555 09/01/12 0540  HGB 10.1* 10.4*  HCT 29.9* 31.7*  PLT 175 176  APTT -- --  LABPROT 14.8 14.4  INR 1.18 1.14  HEPARINUNFRC -- --  CREATININE 0.73 0.68  CKTOTAL -- --  CKMB -- --  TROPONINI -- --    Estimated Creatinine Clearance: 101.4 ml/min (by C-G formula based on Cr of 0.73).   Medical History: Past Medical History  Diagnosis Date  . Bipolar disorder   . MVP (mitral valve prolapse)   . Hyperlipidemia   . Asthma   . Allergic rhinitis   . Arthritis     R knee, Left Knee  . Urinary tract infection     frequent, see Urololgist 12/16/2010  . Dysrhythmia     Palpations occ, takes Tenormin  . Constipation   . Complication of anesthesia     had bad anxiety after anesthesia  . Heart murmur   . Pneumonia   . Bronchitis     hx of  . Anxiety   . Constipation, chronic   . GERD (gastroesophageal reflux disease)   . Seasonal allergies     Medications:  Prescriptions prior to admission  Medication Sig Dispense Refill  . albuterol (PROVENTIL HFA;VENTOLIN HFA) 108 (90 BASE) MCG/ACT inhaler Inhale 2 puffs into the lungs every 6 (six) hours as needed. For shortness of breath      . atenolol (TENORMIN) 50 MG tablet Take 1 tablet (50 mg total) by mouth daily.  180 tablet  0  . beclomethasone (QVAR) 80 MCG/ACT inhaler Inhale 2 puffs into the lungs 2 (two) times daily.  1 Inhaler  3  . clonazePAM (KLONOPIN) 1 MG tablet Take 1 mg by mouth 4  (four) times daily as needed. For anxiety      . estrogens, conjugated, (PREMARIN) 0.625 MG tablet Take 0.625 mg by mouth daily.       . fenofibrate 160 MG tablet Take 1 tablet (160 mg total) by mouth daily.  90 tablet  1  . fexofenadine (ALLEGRA) 180 MG tablet Take 180 mg by mouth daily.      . fluticasone (FLONASE) 50 MCG/ACT nasal spray Place 2 sprays into the nose daily.  16 g  3  . HYDROcodone-acetaminophen (NORCO) 5-325 MG per tablet Take 1 tablet by mouth every 6 (six) hours as needed. For pain      . KRILL OIL 1000 MG CAPS Take 1,000 mg by mouth daily.       Marland Kitchen lamoTRIgine (LAMICTAL) 200 MG tablet Take 200 mg by mouth 2 (two) times daily.       . montelukast (SINGULAIR) 10 MG tablet Take 1 tablet (10 mg total) by mouth at bedtime.  30 tablet  3  . omeprazole (PRILOSEC) 40 MG capsule Take 1 capsule (40 mg total) by mouth daily.  30 capsule  3  . QUEtiapine (SEROQUEL XR) 300 MG 24 hr tablet Take 450 mg by mouth at bedtime.      Marland Kitchen  QUEtiapine (SEROQUEL) 25 MG tablet Take 25 mg by mouth 4 (four) times daily as needed. For anxiety      . rosuvastatin (CRESTOR) 10 MG tablet Take 1 tablet (10 mg total) by mouth at bedtime.  90 tablet  0  . venlafaxine (EFFEXOR-XR) 75 MG 24 hr capsule Take 225 mg by mouth daily.         Assessment: INR = 1.18 in this 30 YOF admitted w/ end stage DJD started coumadin 09/01/12 s/p left TKA for VTE prophylaxis.  Also noted on Lovenox 30 mg Q12h until INR >/= 1.8  CBC decreased post op but stable.  Day #2 of coumadin. No signs of active bleeding.  Warfarin predictor points = 5 points.   Goal of Therapy:  INR 2-3 Monitor platelets by anticoagulation protocol: Yes   Plan: Coumadin 7.5 mg po x1 Daily INR  Noah Delaine, RPh Clinical Pharmacist Pager: [phone] 09/02/2012 11:45 AM

## 2012-09-02 NOTE — Progress Notes (Signed)
Subjective: Doing well.  Concerned about low bloodpressure, but denies feeling lightheaded or dizzy.  Did great with therapy and wants to go home.     Objective: Vital signs in last 24 hours: Temp:  [98.6 F (37 C)-99.7 F (37.6 C)] 98.6 F (37 C) (10/25 1324) Pulse Rate:  [60-81] 75  (10/25 0907) Resp:  [16-18] 18  (10/25 0907) BP: (93-116)/(50-66) 93/50 mmHg (10/25 0611) SpO2:  [96 %-98 %] 98 % (10/25 0907)  Intake/Output from previous day: 10/24 0701 - 10/25 0700 In: 240 [P.O.:240] Out: 50 [Drains:50] Intake/Output this shift:     Basename 09/02/12 0555 09/01/12 0540  HGB 10.1* 10.4*    Basename 09/02/12 0555 09/01/12 0540  WBC 4.5 4.5  RBC 3.35* 3.50*  HCT 29.9* 31.7*  PLT 175 176    Basename 09/02/12 0555 09/01/12 0540  NA 137 137  K 3.6 3.7  CL 101 101  CO2 28 28  BUN 8 11  CREATININE 0.73 0.68  GLUCOSE 143* 105*  CALCIUM 8.5 8.4    Basename 09/02/12 0555 09/01/12 0540  LABPT -- --  INR 1.18 1.14    Wound looks good.  Staples intact.  No drainage or signs of infection.  Calf nontender. Drain removed.    Assessment/Plan: D/c home today. F/u as scheduled.    Emalie Mcwethy M 09/02/2012, 12:42 PM

## 2012-09-02 NOTE — Telephone Encounter (Signed)
Noted incoming PA for omeprazole, called [employer] and requested form to be sent, will fill out when arrived

## 2012-09-02 NOTE — Telephone Encounter (Signed)
Noted incoming fax, filled out forms and faxed back to the pharmacy, will await the approval letter

## 2012-09-02 NOTE — Progress Notes (Signed)
PROGRESS NOTE  09/02/12 1230  PT Visit Information  Last PT Received On 09/02/12  PT Time Calculation  PT Start Time 1230  PT Stop Time 1254  PT Time Calculation (min) 24 min  Subjective Data  Subjective I am going home today  Precautions  Precautions Knee  Required Braces or Orthoses Knee Immobilizer - Left  Knee Immobilizer - Left On except when in CPM  Restrictions  Weight Bearing Restrictions Yes  LLE Weight Bearing WBAT  Cognition  Overall Cognitive Status Appears within functional limits for tasks assessed/performed  Arousal/Alertness Awake/alert  Orientation Level Appears intact for tasks assessed  Behavior During Session [facility] for tasks performed  Bed Mobility  Bed Mobility Supine to Sit;Sit to Supine  Supine to Sit 6: Modified independent (Device/Increase time)  Sitting - Scoot to Edge of Bed 6: Modified independent (Device/Increase time)  Sit to Supine 6: Modified independent (Device/Increase time)  Transfers  Transfers Sit to Stand;Stand to Sit  Sit to Stand 6: Modified independent (Device/Increase time)  Stand to Sit 6: Modified independent (Device/Increase time)  Ambulation/Gait  Ambulation/Gait Assistance Not tested (comment)  Exercises  Exercises Total Joint  Total Joint Exercises  Ankle Circles/Pumps Both;10 reps;Seated  Quad Sets Left;10 reps;Seated  Short Arc Quad 20 reps;Supine;AROM  Heel Slides 10 reps;Left;AROM;Supine  Straight Leg Raises 10 reps;Strengthening;AROM;Supine  PT - End of Session  Equipment Utilized During Treatment Gait belt;Left knee immobilizer  Activity Tolerance Patient tolerated treatment well  Patient left Other (comment) (Pt in bathroom with RN present in room. )  PT - Assessment/Plan  PT Plan All goals met and education completed, patient dischaged from PT services  PT Frequency 7X/week  Follow Up Recommendations Home health PT;Supervision - Intermittent  Equipment Recommended None recommended by PT  Acute Rehab PT Goals  PT  Goal Formulation With patient  Time For Goal Achievement 09/08/12  Potential to Achieve Goals Good  Pt will go Supine/Side to Sit with modified independence  PT Goal: Supine/Side to Sit - Progress Met  Pt will go Sit to Supine/Side with modified independence  PT Goal: Sit to Supine/Side - Progress Met  Pt will Transfer Bed to Chair/Chair to Bed with modified independence  PT Transfer Goal: Bed to Chair/Chair to Bed - Progress Met  Pt will Ambulate >150 feet;with modified independence;with rolling walker  Pt will Perform Home Exercise Program Independently  PT Goal: Perform Home Exercise Program - Progress Progressing toward goal  PT General Charges  $$ ACUTE PT VISIT 1 Procedure  PT Treatments  $Therapeutic Exercise 8-22 mins  $Therapeutic Activity 8-22 mins   Mubashir Mallek L. Clem Wisenbaker DPT [phone]

## 2012-09-02 NOTE — Progress Notes (Signed)
Physical Therapy Treatment Patient Details Name: [name] MRN: [id number] DOB: 1956/03/07 Today's Date: 09/02/2012 Time: 0926-1006 PT Time Calculation (min): 40 min  PT Assessment / Plan / Recommendation Comments on Treatment Session  Pt mobility not limited by her pain.  Pt should be safe to D/c home when cleared by MD.      Follow Up Recommendations  Home health PT;Supervision - Intermittent     Does the patient have the potential to tolerate intense rehabilitation     Barriers to Discharge        Equipment Recommendations  None recommended by PT    Recommendations for Other Services    Frequency 7X/week   Plan Discharge plan remains appropriate;Frequency remains appropriate    Precautions / Restrictions Precautions Precautions: Knee Required Braces or Orthoses: Knee Immobilizer - Left Knee Immobilizer - Left: On except when in CPM Restrictions Weight Bearing Restrictions: Yes LLE Weight Bearing: Weight bearing as tolerated   Pertinent Vitals/Pain Pain in L knee 7/10.  Pt medicated prior to session.      Mobility  Bed Mobility Bed Mobility: Supine to Sit;Sit to Supine Supine to Sit: 5: Supervision Sit to Supine: 4: Min assist Details for Bed Mobility Assistance: Assist lift L LE into bed.   Transfers Transfers: Sit to Stand;Stand to Sit Sit to Stand: 5: Supervision;From bed;From chair/3-in-1;With upper extremity assist Stand to Sit: 5: Supervision;To bed;To chair/3-in-1;With upper extremity assist Details for Transfer Assistance: Supervision for safety.  Ambulation/Gait Ambulation/Gait Assistance: 5: Supervision Ambulation Distance (Feet): 200 Feet Assistive device: Rolling walker Ambulation/Gait Assistance Details: Cues for proper use of RW with reiprocal gait pattern.  Gait Pattern: Step-through pattern Gait velocity: WFL  Stairs: No Wheelchair Mobility Wheelchair Mobility: No    Exercises Total Joint Exercises Quad Sets: Left;10 reps;Seated Short  Arc Quad: 20 reps;Supine;AROM Heel Slides: 10 reps;Left;AROM;Supine Straight Leg Raises: 10 reps;Strengthening;AROM;Supine Goniometric ROM: 0-80 Marching in Standing: 10 reps;Left   PT Diagnosis:    PT Problem List:   PT Treatment Interventions:     PT Goals Acute Rehab PT Goals PT Goal Formulation: With patient Time For Goal Achievement: 09/08/12 Potential to Achieve Goals: Good Pt will go Supine/Side to Sit: with modified independence PT Goal: Supine/Side to Sit - Progress: Progressing toward goal Pt will go Sit to Supine/Side: with modified independence PT Goal: Sit to Supine/Side - Progress: Progressing toward goal Pt will Transfer Bed to Chair/Chair to Bed: with modified independence PT Transfer Goal: Bed to Chair/Chair to Bed - Progress: Progressing toward goal Pt will Ambulate: >150 feet;with modified independence;with rolling walker PT Goal: Ambulate - Progress: Progressing toward goal Pt will Perform Home Exercise Program: Independently PT Goal: Perform Home Exercise Program - Progress: Progressing toward goal  Visit Information  Last PT Received On: 09/02/12    Subjective Data  Subjective: It still hurts pretty bad but I can do therapy.  Blood pressure was low last night.  Patient Stated Goal: Walk without pain.    Cognition  Overall Cognitive Status: Appears within functional limits for tasks assessed/performed Arousal/Alertness: Awake/alert Orientation Level: Appears intact for tasks assessed Behavior During Session: [facility] for tasks performed    Balance  Balance Balance Assessed: No  End of Session PT - End of Session Equipment Utilized During Treatment: Gait belt;Left knee immobilizer Activity Tolerance: Patient tolerated treatment well Patient left: in bed;in CPM;with call bell/phone within reach CPM Left Knee CPM Left Knee: On Left Knee Flexion (Degrees): 60  Left Knee Extension (Degrees): 0  Additional Comments:  CPM on at 1000   GP      Abigale Dorow 09/02/2012, 10:35 AM Darnella Zeiter L. Annabelle Rexroad DPT [phone]

## 2012-09-02 NOTE — Progress Notes (Signed)
Occupational Therapy Treatment Patient Details Name: [name] MRN: [id number] DOB: 1956/10/31 Today's Date: 09/02/2012 Time: 7829-5621 OT Time Calculation (min): 16 min  OT Assessment / Plan / Recommendation Comments on Treatment Session Pt. doing very well able to demonstrate BADL's at mod independence level. Plan for next session to address tub transfer    Follow Up Recommendations  No OT follow up    Barriers to Discharge       Equipment Recommendations  None recommended by OT    Recommendations for Other Services    Frequency Min 2X/week   Plan Discharge plan remains appropriate    Precautions / Restrictions Precautions Precautions: Knee Required Braces or Orthoses: Knee Immobilizer - Left Knee Immobilizer - Left: On except when in CPM Restrictions Weight Bearing Restrictions: Yes LLE Weight Bearing: Weight bearing as tolerated   Pertinent Vitals/Pain None reported     ADL  Lower Body Bathing: Simulated;Modified independent Where Assessed - Lower Body Bathing: Supported sit to stand Lower Body Dressing: Performed;Modified independent Where Assessed - Lower Body Dressing: Supported sit to Pharmacist, hospital: Research scientist (life sciences) Method: Sit to Barista: Raised toilet seat with arms (or 3-in-1 over toilet) Equipment Used: Gait belt;Rolling walker Transfers/Ambulation Related to ADLs: Pt. able to sequence transfers with no vc's. ADL Comments: Completed bed mobility and toilet transfer at supervision level. Educated on components of a hip kit, used reacher to (A) with donning/doffing pants and sock aide to don socks.Pt. plans to purchase hip kit in gift shop prior to returning home. Able to perform LB bathing and dressing at mod independence level.    OT Diagnosis:    OT Problem List:   OT Treatment Interventions:     OT Goals Acute Rehab OT Goals OT Goal Formulation: With patient Time For Goal Achievement:  09/15/12 Potential to Achieve Goals: Good ADL Goals Pt Will Perform Lower Body Bathing: with modified independence;Sit to stand from chair;with adaptive equipment ADL Goal: Lower Body Bathing - Progress: Met Pt Will Perform Lower Body Dressing: with modified independence;Sit to stand from chair;with adaptive equipment ADL Goal: Lower Body Dressing - Progress: Met Pt Will Transfer to Toilet: with modified independence;with DME;3-in-1 ADL Goal: Toilet Transfer - Progress: Progressing toward goals Pt Will Perform Tub/Shower Transfer: Tub transfer;with supervision;Ambulation;with DME;Transfer tub bench  Visit Information  Last OT Received On: 09/02/12 Assistance Needed: +1    Subjective Data      Prior Functioning       Cognition  Overall Cognitive Status: Appears within functional limits for tasks assessed/performed Arousal/Alertness: Awake/alert Orientation Level: Appears intact for tasks assessed Behavior During Session: [facility] for tasks performed    Mobility   Bed Mobility Bed Mobility: Supine to Sit;Sit to Supine Supine to Sit: 5: Supervision Sitting - Scoot to Edge of Bed: 5: Supervision Sit to Supine: 5: Supervision Details for Bed Mobility Assistance: Assist lift L LE into bed.   Transfers Transfers: Sit to Stand;Stand to Sit Sit to Stand: 5: Supervision;With upper extremity assist;From bed Stand to Sit: 5: Supervision;With upper extremity assist;To bed Details for Transfer Assistance: Supervision for safety.        Exercises  Total Joint Exercises Quad Sets: Left;10 reps;Seated Short Arc Quad: 20 reps;Supine;AROM Heel Slides: 10 reps;Left;AROM;Supine Straight Leg Raises: 10 reps;Strengthening;AROM;Supine Goniometric ROM: 0-80 Marching in Standing: 10 reps;Left   Balance Balance Balance Assessed: No   End of Session CPM Left Knee CPM Left Knee: On Left Knee Flexion (Degrees): 60  Left Knee Extension (Degrees):  0  Additional Comments: CPM on at 1000  GO       [name] 09/02/2012, 12:37 PM

## 2012-09-02 NOTE — Progress Notes (Signed)
I agree with the following treatment note after reviewing documentation.   Johnston, Shereese Bonnie Brynn   OTR/L Pager: [phone] Office: [phone] .   

## 2012-09-08 NOTE — Discharge Summary (Signed)
  ABBREVIATED DISCHARGE SUMMARY      DATE OF HOSPITALIZATION:  31 Aug 2012  REASON FOR HOSPITALIZATION:  56 yo wf with hx of end stage djd left knee and pain.  Failed conservative treatment.      SIGNIFICANT FINDINGS:  DJD  OPERATION:  Left total knee replacement  FINAL DIAGNOSIS:  same  SECONDARY DIAGNOSIS: none  CONSULTANTS:  none  DISCHARGE CONDITION:  STABLE  DISCHARGED TO:  HOME

## 2012-09-09 MED ORDER — OMEPRAZOLE 40 MG PO CPDR: 40.0000 mg | DELAYED_RELEASE_CAPSULE | Freq: Every day | ORAL | Status: DC

## 2012-09-09 NOTE — Telephone Encounter (Signed)
Called [employer] per still no incoming fax of approval, was advised the wrong form was sent to [place] unfortunatly was advised by rep Tray that the medication has been approved for this pt for the next 6months and a fax of approval will be sent in, another RX was sent to the pt pharmacy, once the letter is approved this will be scanned into the pt chart, left vm on pt phone to advise the rx has been sent in for her, to call office if any further assistance needed.

## 2012-09-15 ENCOUNTER — Other Ambulatory Visit: Payer: Self-pay

## 2012-09-15 NOTE — Telephone Encounter (Signed)
Ok for samples?

## 2012-09-15 NOTE — Telephone Encounter (Signed)
Called pt to advise Crestor samples will be upfront. Pt stated she will pick up samples tomorrow.        MW

## 2012-09-15 NOTE — Telephone Encounter (Signed)
Pt requesting samples. OV 08/08/12 Crestor last filled 04/15/12 #90 no refills  Plz advise     MW

## 2012-09-21 ENCOUNTER — Ambulatory Visit: Payer: Medicare Other | Admitting: Pharmacist

## 2012-11-21 ENCOUNTER — Ambulatory Visit: Payer: Medicare Other | Admitting: Family Medicine

## 2012-11-21 ENCOUNTER — Ambulatory Visit (INDEPENDENT_AMBULATORY_CARE_PROVIDER_SITE_OTHER): Payer: Medicare Other | Admitting: Family Medicine

## 2012-11-21 ENCOUNTER — Encounter: Payer: Self-pay | Admitting: Family Medicine

## 2012-11-21 VITALS — BP 120/90 | HR 72 | Temp 98.2°F | Ht 69.25 in | Wt 220.4 lb

## 2012-11-21 DIAGNOSIS — J329 Chronic sinusitis, unspecified: Secondary | ICD-10-CM

## 2012-11-21 MED ORDER — AMOXICILLIN 875 MG PO TABS: 875.0000 mg | ORAL_TABLET | Freq: Two times a day (BID) | ORAL | Status: AC

## 2012-11-21 MED ORDER — ALBUTEROL SULFATE HFA 108 (90 BASE) MCG/ACT IN AERS: 2.0000 | INHALATION_SPRAY | RESPIRATORY_TRACT | Status: DC | PRN

## 2012-11-21 MED ORDER — BECLOMETHASONE DIPROPIONATE 80 MCG/ACT IN AERS: 2.0000 | INHALATION_SPRAY | Freq: Two times a day (BID) | RESPIRATORY_TRACT | Status: DC

## 2012-11-21 NOTE — Assessment & Plan Note (Signed)
Pt's sxs and PE consistent w/ infxn.  Start abx.  Reviewed supportive care and red flags that should prompt return.  Pt expressed understanding and is in agreement w/ plan.  

## 2012-11-21 NOTE — Progress Notes (Signed)
  Subjective:    Patient ID: [name], female    DOB: 10-11-1956, 57 y.o.   MRN: [id number]  HPI URI- sxs started 4 days ago, 'feels like i might have some bronchitis'.  + fatigue, sweats/chills, sinus pain, R ear pain.  + dry cough.  Occasional wheezing.  No sore throat.  No known sick contacts.  No N/V/D.  'i just feel awful'.  No body aches.   Review of Systems For ROS see HPI     Objective:   Physical Exam  Vitals reviewed. Constitutional: She appears well-developed and well-nourished. No distress.  HENT:  Head: Normocephalic and atraumatic.  Right Ear: Tympanic membrane normal.  Left Ear: Tympanic membrane normal.  Nose: Mucosal edema and rhinorrhea present. Right sinus exhibits frontal sinus tenderness. Right sinus exhibits no maxillary sinus tenderness. Left sinus exhibits frontal sinus tenderness. Left sinus exhibits no maxillary sinus tenderness.  Mouth/Throat: Uvula is midline and mucous membranes are normal. Posterior oropharyngeal erythema present. No oropharyngeal exudate.  Eyes: Conjunctivae normal and EOM are normal. Pupils are equal, round, and reactive to light.  Neck: Normal range of motion. Neck supple.  Cardiovascular: Normal rate, regular rhythm and normal heart sounds.   Pulmonary/Chest: Effort normal and breath sounds normal. No respiratory distress. She has no wheezes.  Lymphadenopathy:    She has no cervical adenopathy.          Assessment & Plan:

## 2012-11-21 NOTE — Patient Instructions (Addendum)
Start the Amox twice daily- take w/ food- for the sinus infection Add Mucinex to thin your congestion REST! Drink plenty of fluids Hang in there!!!

## 2012-11-29 ENCOUNTER — Other Ambulatory Visit: Payer: Self-pay | Admitting: Internal Medicine

## 2012-12-12 ENCOUNTER — Encounter: Payer: Self-pay | Admitting: Family Medicine

## 2012-12-12 ENCOUNTER — Ambulatory Visit (INDEPENDENT_AMBULATORY_CARE_PROVIDER_SITE_OTHER): Payer: Medicare Other | Admitting: Family Medicine

## 2012-12-12 VITALS — BP 130/70 | HR 61 | Temp 98.3°F | Ht 69.25 in | Wt 219.0 lb

## 2012-12-12 DIAGNOSIS — R5381 Other malaise: Secondary | ICD-10-CM

## 2012-12-12 DIAGNOSIS — R002 Palpitations: Secondary | ICD-10-CM | POA: Insufficient documentation

## 2012-12-12 DIAGNOSIS — R5383 Other fatigue: Secondary | ICD-10-CM | POA: Insufficient documentation

## 2012-12-12 LAB — CBC WITH DIFFERENTIAL/PLATELET
Basophils Absolute: 0 10*3/uL (ref 0.0–0.1)
Eosinophils Absolute: 0.1 10*3/uL (ref 0.0–0.7)
Eosinophils Relative: 1.1 % (ref 0.0–5.0)
HCT: 36.5 % (ref 36.0–46.0)
Lymphs Abs: 1.5 10*3/uL (ref 0.7–4.0)
MCHC: 34.1 g/dL (ref 30.0–36.0)
MCV: 87.6 fl (ref 78.0–100.0)
Monocytes Absolute: 0.4 10*3/uL (ref 0.1–1.0)
Neutrophils Relative %: 65.9 % (ref 43.0–77.0)
Platelets: 314 10*3/uL (ref 150.0–400.0)
RDW: 13.7 % (ref 11.5–14.6)
WBC: 6 10*3/uL (ref 4.5–10.5)

## 2012-12-12 LAB — BASIC METABOLIC PANEL
Calcium: 10.1 mg/dL (ref 8.4–10.5)
GFR: 76.83 mL/min (ref >60.00)
Glucose, Bld: 88 mg/dL (ref 70–99)
Potassium: 4.5 mEq/L (ref 3.5–5.1)
Sodium: 139 mEq/L (ref 135–145)

## 2012-12-12 LAB — TSH: TSH: 1.35 u[IU]/mL (ref 0.35–5.50)

## 2012-12-12 NOTE — Assessment & Plan Note (Signed)
New.  Suspect this is just a prolonged recovery from recent illness but will check labs to r/o underlying metabolic cause (anemia, hypothyroid, electrolyte disturbance)

## 2012-12-12 NOTE — Assessment & Plan Note (Signed)
New.  Pt's EKG WNL today.  Check labs to r/o underlying cause.  If sxs persist- may need cards referral and possibly holter monitor.  Will follow.

## 2012-12-12 NOTE — Progress Notes (Signed)
  Subjective:    Patient ID: [name], female    DOB: 1956-07-27, 57 y.o.   MRN: [id number]  HPI Fatigue- pt was seen on 1/13 for sinusitis.  Reports she was initially feeling better but then 1 week ago developed cold symptoms and overwhelming fatigue.  Sleeping well at night.  Will wake up tired.  No hx of snoring.  Eating well.  'i'm just worn out, it doesn't matter how much sleep i get'.  No hx of anemia or thyroid problems.  Palpitations- hx of MVP, on atenolol.  More noticeable since she got sick.  sxs will come and go.  No CP, SOB, edema.   Review of Systems For ROS see HPI     Objective:   Physical Exam  Vitals reviewed. Constitutional: She is oriented to person, place, and time. She appears well-developed and well-nourished. No distress.  HENT:  Head: Normocephalic and atraumatic.  Nose: Nose normal.  Mouth/Throat: Oropharynx is clear and moist. No oropharyngeal exudate.       No TTP over sinuses TMs WNL bilaterally  Eyes: Conjunctivae normal and EOM are normal. Pupils are equal, round, and reactive to light.  Neck: Normal range of motion. Neck supple. No thyromegaly present.  Cardiovascular: Normal rate, regular rhythm, normal heart sounds and intact distal pulses.   No murmur heard. Pulmonary/Chest: Effort normal and breath sounds normal. No respiratory distress.  Abdominal: Soft. She exhibits no distension. There is no tenderness.  Musculoskeletal: She exhibits no edema.  Lymphadenopathy:    She has no cervical adenopathy.  Neurological: She is alert and oriented to person, place, and time.  Skin: Skin is warm and dry.  Psychiatric: She has a normal mood and affect. Her behavior is normal.          Assessment & Plan:

## 2012-12-12 NOTE — Patient Instructions (Addendum)
We'll notify you of your lab results and make any changes if needed Your EKG looks great!  This is good news! If the palpitations continue- please call and we'll get you set up w/ Cardiology Continue to rest- give yourself time to recover Hang in there!

## 2012-12-13 ENCOUNTER — Encounter: Payer: Self-pay | Admitting: *Deleted

## 2012-12-20 ENCOUNTER — Ambulatory Visit: Payer: BC Managed Care – PPO | Admitting: Pharmacist

## 2013-01-05 ENCOUNTER — Ambulatory Visit (INDEPENDENT_AMBULATORY_CARE_PROVIDER_SITE_OTHER): Payer: Medicare Other | Admitting: Pharmacist

## 2013-01-05 VITALS — Wt 229.0 lb

## 2013-01-05 DIAGNOSIS — E785 Hyperlipidemia, unspecified: Secondary | ICD-10-CM

## 2013-01-05 DIAGNOSIS — E7800 Pure hypercholesterolemia, unspecified: Secondary | ICD-10-CM

## 2013-01-05 MED ORDER — ROSUVASTATIN CALCIUM 10 MG PO TABS: 20.0000 mg | ORAL_TABLET | Freq: Every day | ORAL | Status: DC

## 2013-01-05 NOTE — Progress Notes (Signed)
HPI  [name] is a 57 yo F who is seen for follow up visit in [facility]id Clinic.  She did not come for fasting labs prior to this visit.  Patient was in good spirits but fairly concerned with her cholesterol. She does not have any other significant co-morbidities (No CAD, HTN, DM, not a smoker). Her family history is significant for her father who died of an MI at age 28. She also has a brother w/ high cholesterol but like her has no other significant comorbidities.  Patient is currently taking Crestor 10mg , fenofibrate 160mg .   She has tried Niaspan in the past and experienced intolerable flushing. Patient also did not tolerate atorvastatin in the past but wasn't sure why. She reports GI upset from Zetia as well per phone note 10/13  Diet -  Her diet has been worse recently, but she was adamant about improving it. Her avg breakfast is a prt shake from [facility] with skim milk.   Lunch is usually her largest meal and she will often go out for lunch for fast food or [employer]. She used to eat half and bring the leftovers home and plans to resume this practice but has not implemented that yet. Dinner is often small and fairly inconsistent. She does snack frequently/stress eat on things like chips/crackers, but she does not drink sweet beverages.   Exercise - was minimal until recently.  She has recently started walking 85min(1-1.5mi)  most days a week.  She has joined [employer] but has not gone yet.  Current Outpatient Prescriptions on File Prior to Visit  Medication Sig Dispense Refill  . albuterol (PROAIR HFA) 108 (90 BASE) MCG/ACT inhaler Inhale 2 puffs into the lungs every 4 (four) hours as needed for wheezing.  1 Inhaler  6  . atenolol (TENORMIN) 50 MG tablet TAKE 1 TABLET BY MOUTH EVERY DAY  180 tablet  0  . beclomethasone (QVAR) 80 MCG/ACT inhaler Inhale 2 puffs into the lungs 2 (two) times daily.  1 Inhaler  3  . clonazePAM (KLONOPIN) 1 MG tablet Take 1 mg by mouth 4 (four) times daily as  needed. For anxiety      . estrogens, conjugated, (PREMARIN) 0.625 MG tablet Take 0.625 mg by mouth daily.       . fenofibrate 160 MG tablet Take 1 tablet (160 mg total) by mouth daily.  90 tablet  1  . fexofenadine (ALLEGRA) 180 MG tablet Take 180 mg by mouth daily.      . fluticasone (FLONASE) 50 MCG/ACT nasal spray Place 2 sprays into the nose daily.  16 g  3  . lamoTRIgine (LAMICTAL) 200 MG tablet Take 200 mg by mouth 2 (two) times daily.       . montelukast (SINGULAIR) 10 MG tablet Take 1 tablet (10 mg total) by mouth at bedtime.  30 tablet  3  . omeprazole (PRILOSEC) 40 MG capsule Take 1 capsule (40 mg total) by mouth daily.  30 capsule  3  . QUEtiapine (SEROQUEL XR) 300 MG 24 hr tablet Take 450 mg by mouth at bedtime.      Marland Kitchen QUEtiapine (SEROQUEL) 25 MG tablet Take 25 mg by mouth 4 (four) times daily as needed. For anxiety      . venlafaxine (EFFEXOR-XR) 75 MG 24 hr capsule Take 225 mg by mouth daily.        No current facility-administered medications on file prior to visit.    Allergies  Allergen Reactions  . Nitrofurantoin Monohyd  Macro Nausea Only    Severe headache  . Alprazolam   . Sulfa Antibiotics Nausea And Vomiting

## 2013-01-05 NOTE — Assessment & Plan Note (Addendum)
[name] did not come for fasting blood work prior to this Lipid visit.  I have reviewed her NMR panel from 9/13.   She is compliant with medications Tricor 160mg  and Crestor 10mg  daily.  Last LFT wnl. I have increased her Crestor 20mg  daily and continue Tricor.  Most recent Lipid guidelines recommend highest dose of tolerated statin.  She is retired from [employer] and not working at this time.  She occasionally pet sits for friends. She was encouraged to exercise by walking 1.58mi daily and going to [employer] 2-3 days /week. I have encouraged her to make small strides in her diet each week.  Week 1 decrease portion size, week 2 decrease fast food 1 time week, week 3 decrease serving size of snacks and so on.  If she makes too many changes at a time I do not think she will follow through for very long.   Follow up visit scheduled for 8 weeks to eval inc dose of Crestor on lipid panel and lfts

## 2013-01-05 NOTE — Patient Instructions (Signed)
Glad you are ready to make changes to your diet and exercise!! 1.  Increase Crestor 20mg  daily 2.  Continue Fenofibrate 160mg  daily 3.  Walk 1.5-2 miles daily and start going to the gym 2-3 days a week 4.  Decrease portion sizes of 1/2 plate when out to eat. 5.  Limit fast food to 1 time week 6.  Come for fasting blood work the week before appt 7.  Come for Cholesterol appt in 8 weeks April 24 Thur at 1030

## 2013-01-17 ENCOUNTER — Telehealth: Payer: Self-pay | Admitting: Family Medicine

## 2013-01-17 NOTE — Telephone Encounter (Signed)
Please advise on RF request.//AB/CMA 

## 2013-01-17 NOTE — Telephone Encounter (Signed)
Ok to give 2 weeks of meds to allow pt to get to GYN.  At that time, they will determine whether she is to continue.

## 2013-01-17 NOTE — Telephone Encounter (Signed)
Caller: [name]/Patient; Phone: [phone]; Reason for Call: PCP is Dr.  Beverely Low at [facility] office, but call came in through [place] line.  Patient has appt with new OB/GYN but has run out of Premarin tabs.  States she has contacted GYN office for renewal, but states they have not returned her call.  States appt with new GYN is 01/24/13.  States her Premarin dose is 0.  625mg  1 tab daily.  Declines triage.  Would like bridge Rx for premarin if possible x 7 days.  Per patient request/insistence, info to office for provider review/callback.  Uses [facility].  May reach patient at [phone].  Krs/can

## 2013-01-18 ENCOUNTER — Telehealth: Payer: Self-pay | Admitting: Family Medicine

## 2013-01-18 NOTE — Telephone Encounter (Signed)
LM @ (12:04pm) asking the pt to RTC.//AB/CMA

## 2013-01-18 NOTE — Telephone Encounter (Signed)
Caller: Ranika/Patient; Phone: [phone]; Reason for Call: Patient calling to speak with Angie and states she spoke with her yesterday regarding medicaiton and has not heard anything back.

## 2013-01-18 NOTE — Telephone Encounter (Signed)
See other telephone encounter.Left message to call office.

## 2013-01-20 NOTE — Telephone Encounter (Signed)
Spoke with the pt and she stated that Dr. Thomasene Lot office refilled her Premarin.  Pt stated that she does have an appt with Dr. Arelia Sneddon and she will talk with him more regarding changing her meds.//AB/CMA

## 2013-01-26 ENCOUNTER — Encounter: Payer: Self-pay | Admitting: Internal Medicine

## 2013-01-26 NOTE — Telephone Encounter (Signed)
Message sent in error

## 2013-02-15 ENCOUNTER — Encounter: Payer: Self-pay | Admitting: Family Medicine

## 2013-02-15 ENCOUNTER — Ambulatory Visit (INDEPENDENT_AMBULATORY_CARE_PROVIDER_SITE_OTHER): Payer: Medicare Other | Admitting: Family Medicine

## 2013-02-15 VITALS — BP 108/80 | HR 69 | Temp 98.6°F | Ht 69.25 in | Wt 221.4 lb

## 2013-02-15 DIAGNOSIS — J329 Chronic sinusitis, unspecified: Secondary | ICD-10-CM

## 2013-02-15 MED ORDER — AMOXICILLIN 875 MG PO TABS: 875.0000 mg | ORAL_TABLET | Freq: Two times a day (BID) | ORAL | Status: DC

## 2013-02-15 NOTE — Progress Notes (Signed)
  Subjective:    Patient ID: [name], female    DOB: Aug 22, 1956, 57 y.o.   MRN: [id number]  HPI URI- pt reports sinus pain/pressure, HA, sore throat.  sxs started Sunday.  'i just feel sick'.  No tooth pain.  + maxillary pain.  No ear pain.  Minimal cough.  + nasal congestion.  No fevers.  + sick contacts.   Review of Systems For ROS see HPI     Objective:   Physical Exam  Vitals reviewed. Constitutional: She appears well-developed and well-nourished. No distress.  HENT:  Head: Normocephalic and atraumatic.  Right Ear: Tympanic membrane normal.  Left Ear: Tympanic membrane normal.  Nose: Mucosal edema and rhinorrhea present. Right sinus exhibits maxillary sinus tenderness. Right sinus exhibits no frontal sinus tenderness. Left sinus exhibits maxillary sinus tenderness. Left sinus exhibits no frontal sinus tenderness.  Mouth/Throat: Uvula is midline and mucous membranes are normal. Posterior oropharyngeal erythema present. No oropharyngeal exudate.  Eyes: Conjunctivae and EOM are normal. Pupils are equal, round, and reactive to light.  Neck: Normal range of motion. Neck supple.  Cardiovascular: Normal rate, regular rhythm and normal heart sounds.   Pulmonary/Chest: Effort normal and breath sounds normal. No respiratory distress. She has no wheezes.  Lymphadenopathy:    She has no cervical adenopathy.          Assessment & Plan:

## 2013-02-15 NOTE — Patient Instructions (Addendum)
This is a sinus infection Start the Amoxicillin twice daily- take w/ food Continue the Singulair and Allegra Call with any questions or concerns Hang in there!

## 2013-02-15 NOTE — Assessment & Plan Note (Signed)
Pt's sxs and PE consistent w/ infxn.  Start abx.  Reviewed supportive care and red flags that should prompt return.  Pt expressed understanding and is in agreement w/ plan.  

## 2013-02-20 ENCOUNTER — Other Ambulatory Visit: Payer: Medicare Other

## 2013-02-20 ENCOUNTER — Telehealth: Payer: Self-pay | Admitting: Family Medicine

## 2013-02-20 MED ORDER — AZITHROMYCIN 250 MG PO TABS: ORAL_TABLET | ORAL | Status: DC

## 2013-02-20 NOTE — Telephone Encounter (Signed)
Patient Information:  Caller Name: Milisa  Phone: [phone]  Patient: [name], [name]  Gender: Female  DOB: 04-06-1956  Age: 57 Years  PCP: Sheliah Hatch  Pregnant: No  Office Follow Up:  Does the office need to follow up with this patient?: Yes  Instructions For The Office: Rx question; please call back.  RN Note:  Itching nearly gone 24 hours after stopping Amoxicillin.  No visible rash. Sore throat resolved, sinus pressure improved, but continues to have mild frontal headaches. Asking if needs a different antibiotic. [facility]/[address].  Symptoms  Reason For Call & Symptoms: Called about suspected allergic reaction to Amoxicillin.  Noted "tingling in arms, hands and legs," red rash from itchy/scratching on arms, and generalized itching after several days of Amoxicillin.  Asking for a different antibiotic for sinus infection.  Reviewed Health History In EMR: Yes  Reviewed Medications In EMR: Yes  Reviewed Allergies In EMR: Yes  Reviewed Surgeries / Procedures: Yes  Date of Onset of Symptoms: 02/19/2013  Treatments Tried: Stopped the antibiotic  Treatments Tried Worked: Yes OB / GYN:  LMP: Unknown  Guideline(s) Used:  Itching - Widespread  Disposition Per Guideline:   Discuss with PCP and Callback by Nurse Today  Reason For Disposition Reached:   Taking prescription medication that could cause itching (e.g., codeine/morphine/other opiates, aspirin)  Advice Given:  Roe Coombs  Try not to scratch.  Itching is often worsened by scratching (the "Itch-Scratch" cycle).  Cut the fingernails short. (Reason: prevent secondary bacterial infection.)  Cool Bath For Flare-Up:  For flare-ups of itching, take a cool bath without soap for 15 minutes, 1-2 times per day. Pat dry using towel - do not rub.  Another option is an Insurance account manager Risk manager) [employer]. Sprinkle contents of one packet under running faucet. (Caution: this can make bathtub slippery.)  Oral Antihistamine Medication for  Itching:  Take an antihistamine by mouth to reduce the itching. Diphenhydramine (Benadryl) is available over-the-counter. Adult dose is 25-50 mg. Take it up to 4 times a day.  Antihistamines may cause sleepiness. Do not drink, drive, or operate dangerous machinery while taking antihistamines.  Patient Will Follow Care Advice:  YES

## 2013-02-20 NOTE — Telephone Encounter (Signed)
Can switch to Zpack to complete abx tx

## 2013-02-20 NOTE — Telephone Encounter (Signed)
Discuss with patient, Rx sent. 

## 2013-02-23 ENCOUNTER — Encounter: Payer: Self-pay | Admitting: Family Medicine

## 2013-02-23 ENCOUNTER — Ambulatory Visit (INDEPENDENT_AMBULATORY_CARE_PROVIDER_SITE_OTHER): Payer: Medicare Other | Admitting: Family Medicine

## 2013-02-23 VITALS — BP 130/78 | HR 65 | Temp 98.9°F | Wt 224.0 lb

## 2013-02-23 DIAGNOSIS — L853 Xerosis cutis: Secondary | ICD-10-CM | POA: Insufficient documentation

## 2013-02-23 DIAGNOSIS — L738 Other specified follicular disorders: Secondary | ICD-10-CM

## 2013-02-23 MED ORDER — AMMONIUM LACTATE 12 % EX CREA: TOPICAL_CREAM | CUTANEOUS | Status: DC | PRN

## 2013-02-23 NOTE — Assessment & Plan Note (Signed)
Lac hydrin cream Check thyroid F/u prn

## 2013-02-23 NOTE — Progress Notes (Signed)
  Subjective:    Patient ID: [name], female    DOB: 02/02/1956, 57 y.o.   MRN: [id number]  HPI Pt here c/o dry skin and itching since amoxicillin.  Rash has resolved.  Pt has finished zpak. No other complaints   Review of Systems As above    Objective:   Physical Exam BP 130/78  Pulse 65  Temp(Src) 98.9 F (37.2 C) (Oral)  Wt 224 lb (101.606 kg)  BMI 32.84 kg/m2  SpO2 97%  LMP 11/09/2000 General appearance: alert, cooperative, appears stated age and no distress Skin: Skin color, texture, turgor normal. No rashes or lesions          Skin is very dry        Assessment & Plan:

## 2013-02-24 LAB — TSH: TSH: 1.08 u[IU]/mL (ref 0.35–5.50)

## 2013-02-25 ENCOUNTER — Encounter (HOSPITAL_COMMUNITY): Payer: Self-pay | Admitting: Emergency Medicine

## 2013-02-25 ENCOUNTER — Emergency Department (HOSPITAL_COMMUNITY)
Admission: EM | Admit: 2013-02-25 | Discharge: 2013-02-25 | Disposition: A | Discharge status: Home / Self Care | Payer: Medicare Other | Source: Home / Self Care | Attending: Emergency Medicine | Admitting: Emergency Medicine

## 2013-02-25 DIAGNOSIS — R202 Paresthesia of skin: Secondary | ICD-10-CM

## 2013-02-25 DIAGNOSIS — R209 Unspecified disturbances of skin sensation: Secondary | ICD-10-CM

## 2013-02-25 DIAGNOSIS — L299 Pruritus, unspecified: Secondary | ICD-10-CM

## 2013-02-25 LAB — POCT I-STAT, CHEM 8
BUN: 13 mg/dL (ref 6–23)
Calcium, Ion: 1.22 mmol/L (ref 1.12–1.23)
Creatinine, Ser: 0.7 mg/dL (ref 0.50–1.10)
Glucose, Bld: 89 mg/dL (ref 70–99)
TCO2: 24 mmol/L (ref 0–100)

## 2013-02-25 MED ORDER — TRIAMCINOLONE ACETONIDE 0.1 % EX CREA: TOPICAL_CREAM | Freq: Three times a day (TID) | CUTANEOUS | Status: DC

## 2013-02-25 MED ORDER — HYDROXYZINE HCL 25 MG PO TABS
25.0000 mg | ORAL_TABLET | Freq: Four times a day (QID) | ORAL | Status: DC
Start: 2013-02-25 — End: 2013-06-09

## 2013-02-25 NOTE — ED Provider Notes (Signed)
Chief Complaint:   Chief Complaint  Patient presents with  . Medication Reaction    History of Present Illness:   [name] is a 57 year old female who presents with a one-week history of tingling all over her entire body and itching all over. She has a couple of red spots on her left antecubital fossa, but these appear to be reactions to a Band-Aid that she had there. She has no other skin rash. Her skin feels dry at times and sometimes feels like it's been sunburned. It's worse if she is inactive or sitting and better if she gets up and moves around. She had an allergic reaction to amoxicillin about 2 weeks ago. This was stopped and she was switched to azithromycin. She saw her primary care Dr. Laury Axon on April 17. She was diagnosed as having dry skin and given ammonium lactate cream. They also checked her thyroid levels which she did not know the results of, but looking in the computer it appears that they're normal. She denies any swelling of the lips, tongue, throat and she's had no difficulty breathing.  Review of Systems:  Other than noted above, the patient denies any of the following symptoms. Systemic:  No fever, chills, sweats, fatigue, myalgias, headache, or anorexia. Eye:  No redness, pain or drainage. ENT:  No earache, nasal congestion, rhinorrhea, sinus pressure, or sore throat. Lungs:  No cough, sputum production, wheezing, shortness of breath.  Cardiovascular:  No chest pain, palpitations, or syncope. GI:  No nausea, vomiting, abdominal pain or diarrhea. GU:  No dysuria, frequency, or hematuria. Skin:  No rash or pruritis.  PMFSH:  Past medical history, family history, social history, meds, and allergies were reviewed.  She is allergic to prednisone, Xanax, amoxicillin, clindamycin, and sulfa. She takes Seroquel, clonazepam, Lamictal, Effexor, Crestor, fenofibrate, and atenolol. She has bipolar disorder and elevated cholesterol.  Physical Exam:   Vital signs:  BP 115/68   Pulse 73  Temp(Src) 98.5 F (36.9 C) (Oral)  Resp 18  SpO2 98%  LMP 11/09/2000 General:  Alert, in no distress. Eye:  PERRL, full EOMs.  Lids and conjunctivas were normal. ENT:  TMs and canals were normal, without erythema or inflammation.  Nasal mucosa was clear and uncongested, without drainage.  Mucous membranes were moist.  Pharynx was clear, without exudate or drainage.  There were no oral ulcerations or lesions. Neck:  Supple, no adenopathy, tenderness or mass. Thyroid was normal. Lungs:  No respiratory distress.  Lungs were clear to auscultation, without wheezes, rales or rhonchi.  Breath sounds were clear and equal bilaterally. Heart:  Regular rhythm, without gallops, murmers or rubs. Abdomen:  Soft, flat, and non-tender to palpation.  No hepatosplenomagaly or mass. Skin:  Clear, warm, and dry, without rash or lesions.  Labs:   Results for orders placed during the hospital encounter of 02/25/13  POCT I-STAT, CHEM 8      Result Value Range   Sodium 140  135 - 145 mEq/L   Potassium 3.7  3.5 - 5.1 mEq/L   Chloride 104  96 - 112 mEq/L   BUN 13  6 - 23 mg/dL   Creatinine, Ser 1.61  0.50 - 1.10 mg/dL   Glucose, Bld 89  70 - 99 mg/dL   Calcium, Ion 0.96  0.45 - 1.23 mmol/L   TCO2 24  0 - 100 mmol/L   Hemoglobin 12.9  12.0 - 15.0 g/dL   HCT 40.9  81.1 - 91.4 %    Assessment:  The primary encounter diagnosis was Pruritus. A diagnosis of Paresthesias was also pertinent to this visit.  Paresthesias and pruritus of uncertain cause. This could be residual effects of reaction to amoxicillin, reaction to some other antigen, or idiopathic.  Plan:   1.  The following meds were prescribed:   Discharge Medication List as of 02/25/2013  6:13 PM    START taking these medications   Details  hydrOXYzine (ATARAX/VISTARIL) 25 MG tablet Take 1 tablet (25 mg total) by mouth every 6 (six) hours., Starting 02/25/2013, Until Discontinued, Normal    triamcinolone cream (KENALOG) 0.1 % Apply  topically 3 (three) times daily., Starting 02/25/2013, Until Discontinued, Normal       2.  The patient was instructed in symptomatic care and handouts were given. 3.  The patient was told to return if becoming worse in any way, if no better in 3 or 4 days, and given some red flag symptoms such as skin rash or difficulty breathing that would indicate earlier return.    Reuben Likes, MD 02/25/13 [phone]

## 2013-02-25 NOTE — ED Notes (Signed)
Pt is here for poss reaction to Amoxicillin Was given Amox on Tuesday of last week for a sinus inf Took it for about a week and stopped it b/c it was making her itch, feel tingly like "pins sticking her" Saw PCP on thurs; Thyroid checked... Pending results; given Ammonium Laccate 12% lotion Pt still feeling tingly and burning sensation Denies: f/v/n/d  She is alert and oriented w/no signs of acute distress.

## 2013-03-02 ENCOUNTER — Ambulatory Visit: Payer: Medicare Other | Admitting: Pharmacist

## 2013-03-03 ENCOUNTER — Telehealth: Payer: Self-pay | Admitting: Family Medicine

## 2013-03-03 NOTE — Telephone Encounter (Signed)
No evidence of kidney problems on labs done earlier this month.  Can take benadryl for itching and use Aveeno oatmeal bath to help.  Can schedule appt for Monday w/ me.  If sxs worsen over the weekend can go to UC.

## 2013-03-03 NOTE — Telephone Encounter (Signed)
Patient Information:  Caller Name: [name]  Phone: [phone]  Patient: [name], [name]  Gender: Female  DOB: [birth year]  Age: 57 Years  PCP: [name]  Pregnant: No  Office Follow Up:  Does the office need to follow up with this patient?: Yes  Instructions For The Office: Appts not available 03/03/13; patient wants to know next steps from Dr. Beverely Hill krs/can  RN Note:  States generalized itching noted x 2 weeks.  Initially started after taking amoxicillin, but has not resolved.  States skin feels tingly as well.  No rash present, but skin is dry and "stings."  Able to sleep, but irritation/tingling does interfere with daily activities.  Did see dermatolgist AM 03/03/13 and given Rx for cream and told to follow up with PCP regarding possible liver or kidney issues.  Per itching protocol, emergent symptoms denied; advised being seen within 72 hours.  Patient would like appt 03/03/13, but no appts available in Epic.  Patient is uncomfortable with the tingling.  Info to office for provider review/possible Rx/appt/callback.  May reach patient [phone].  krs/can  Symptoms  Reason For Call & Symptoms: tingling skin; seen in UC, dermatology, and PCP but not improving  Reviewed Health History In EMR: Yes  Reviewed Medications In EMR: Yes  Reviewed Allergies In EMR: Yes  Reviewed Surgeries / Procedures: Yes  Date of Onset of Symptoms: 02/17/2013 OB / GYN:  LMP: Unknown  Guideline(s) Used:  Itching - Widespread  Disposition Per Guideline:   See Within 3 Days in Office  Reason For Disposition Reached:   Widespread itching and cause unknown and present > 48 hours  Advice Given:  N/A  Patient Will Follow Care Advice:  YES

## 2013-03-03 NOTE — Telephone Encounter (Signed)
Spoke with the pt and informed her of Dr. Rennis Golden note and recommendation below.  Pt agreed and was scheduled for an appt on Monday(03-06-13).//AB/CMA

## 2013-03-06 ENCOUNTER — Encounter: Payer: Self-pay | Admitting: Family Medicine

## 2013-03-06 ENCOUNTER — Ambulatory Visit (INDEPENDENT_AMBULATORY_CARE_PROVIDER_SITE_OTHER): Payer: Medicare Other | Admitting: Family Medicine

## 2013-03-06 VITALS — BP 118/70 | HR 82 | Temp 98.7°F | Ht 69.25 in | Wt 222.6 lb

## 2013-03-06 DIAGNOSIS — R202 Paresthesia of skin: Secondary | ICD-10-CM | POA: Insufficient documentation

## 2013-03-06 DIAGNOSIS — R209 Unspecified disturbances of skin sensation: Secondary | ICD-10-CM

## 2013-03-06 NOTE — Progress Notes (Signed)
  Subjective:    Patient ID: [name], female    DOB: 29-Dec-1955, 57 y.o.   MRN: [id number]  HPI Tingling- sxs started 3 weeks ago.  Denies numbness.  Tingling will travel throughout body.  Reports excessively dry skin.  'feels prickly'.  Pt is questioning whether this is menopause related or something else.  Drinking plenty of water.  Is only taking gabapentin prn for severe anxiety but was previously taking it regularly.  Pt can't relate if sxs started when she stopped taking med regularly.   Review of Systems For ROS see HPI     Objective:   Physical Exam  Vitals reviewed. Constitutional: She is oriented to person, place, and time. She appears well-developed and well-nourished. No distress.  Musculoskeletal: She exhibits no edema.  Neurological: She is alert and oriented to person, place, and time. She has normal reflexes. No cranial nerve deficit. Coordination normal.  Skin: Skin is warm and dry. No rash noted. No erythema.  Psychiatric: Her behavior is normal. Thought content normal.  Slightly anxious          Assessment & Plan:

## 2013-03-06 NOTE — Patient Instructions (Addendum)
We'll notify you of your lab results and determine the next steps Restart the gabapentin daily We'll figure this out!

## 2013-03-07 ENCOUNTER — Encounter: Payer: Self-pay | Admitting: Internal Medicine

## 2013-03-07 LAB — B12 AND FOLATE PANEL: Folate: >24.8 ng/mL (ref >5.9)

## 2013-03-09 ENCOUNTER — Encounter: Payer: Self-pay | Admitting: *Deleted

## 2013-03-09 LAB — VITAMIN D 1,25 DIHYDROXY: Vitamin D 1, 25 (OH)2 Total: 46 pg/mL (ref 18–72)

## 2013-03-09 NOTE — Assessment & Plan Note (Signed)
New.  No obvious cause.  Pt had recent thyroid and electrolytes checked.  Check B12 and folate as this can cause sxs.  Encouraged her to resume gabapentin daily.  Will follow.

## 2013-03-21 ENCOUNTER — Other Ambulatory Visit: Payer: Self-pay | Admitting: Family Medicine

## 2013-03-21 ENCOUNTER — Encounter: Payer: Medicare Other | Admitting: Internal Medicine

## 2013-03-22 ENCOUNTER — Emergency Department: Payer: Self-pay

## 2013-03-23 NOTE — Telephone Encounter (Signed)
Rx sent to the pharmacy by e-script.//AB/CMA 

## 2013-04-24 ENCOUNTER — Telehealth: Payer: Self-pay | Admitting: *Deleted

## 2013-04-24 NOTE — Telephone Encounter (Signed)
Pt left VM wanting to know if she can have samples of Crestor.Please advise

## 2013-04-24 NOTE — Telephone Encounter (Signed)
Ok for [employer] 20mg  samples if available (or 10 mgs but will need to take 2)

## 2013-04-24 NOTE — Telephone Encounter (Signed)
Spoke with the pt and informed her that Dr. Tawni Carnes the samples of Crestor 20mg , and I will leave them up front for her to pick-up.  Pt agreed.//AB/CMA

## 2013-04-25 IMAGING — CR DG CHEST 2V
2 series · 2 of 2 positions shown · non-contrast
Comparison: None.

CLINICAL DATA: Preop right total knee arthroplasty

CHEST - 2 VIEW

[view not recorded (1 of 2)]
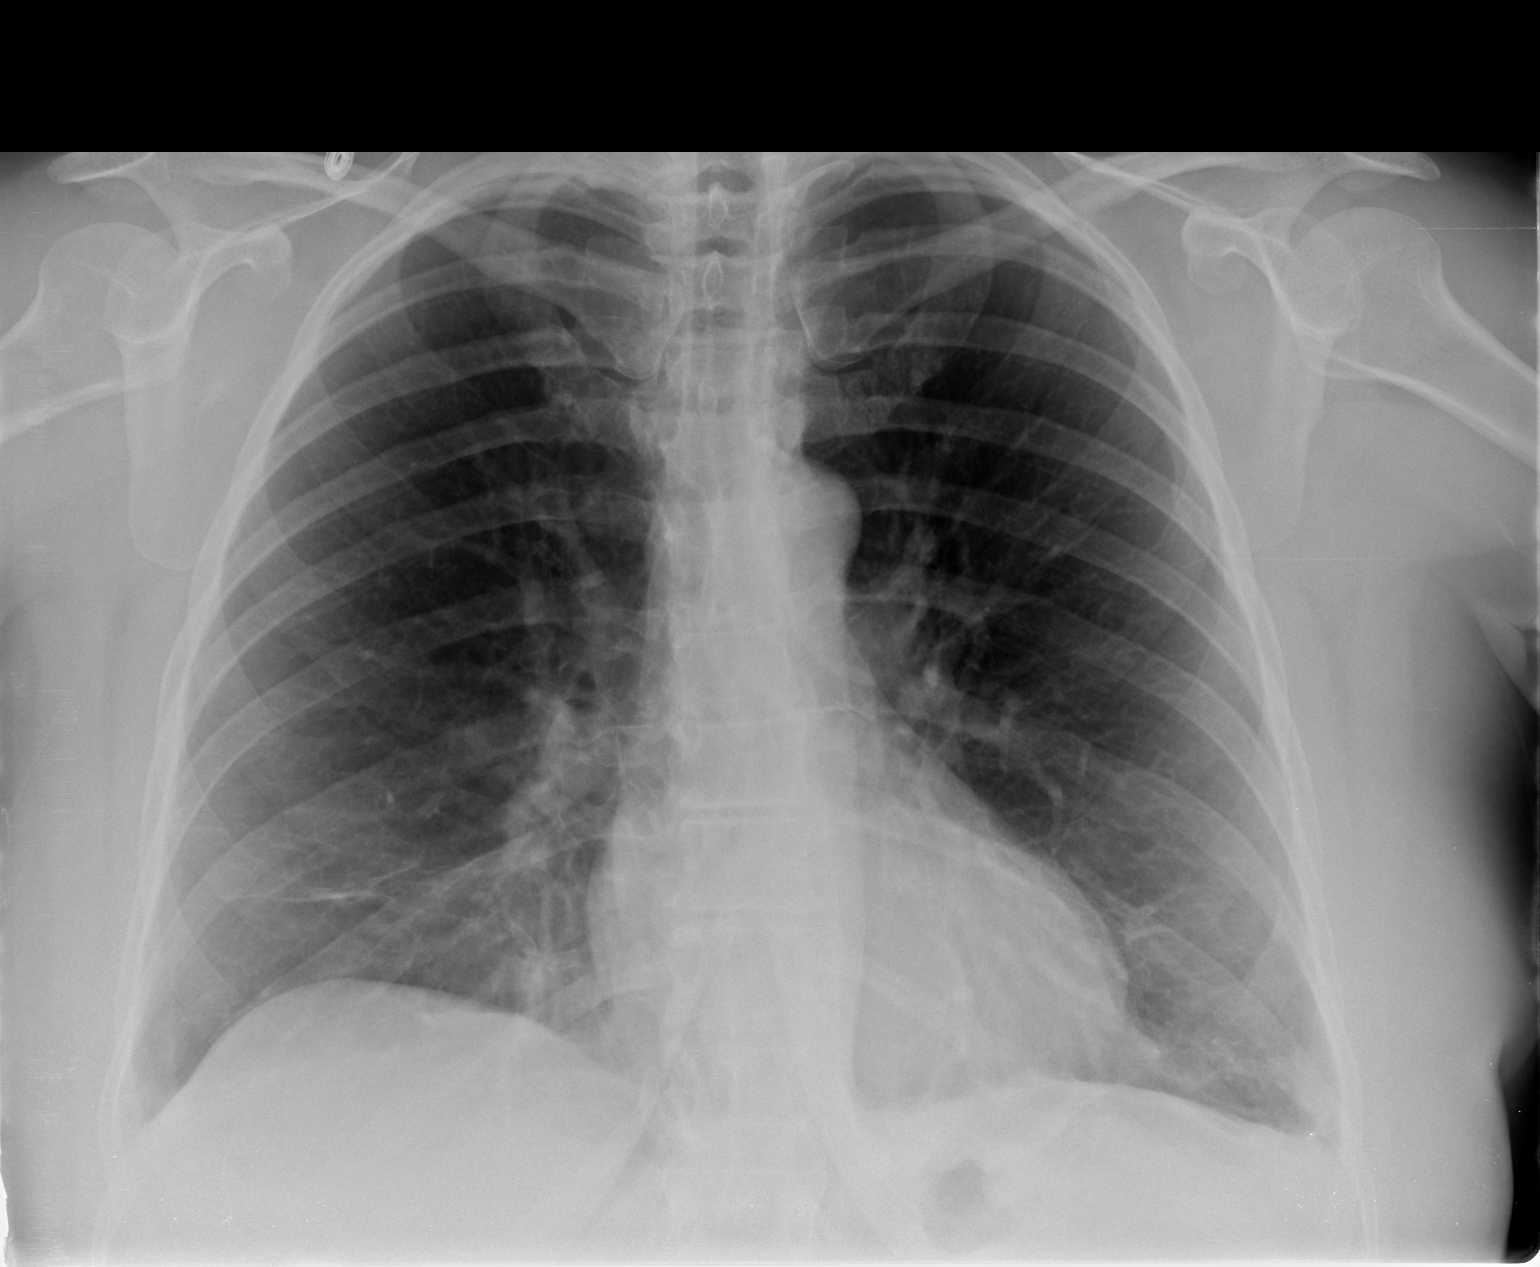

[view not recorded (2 of 2)]
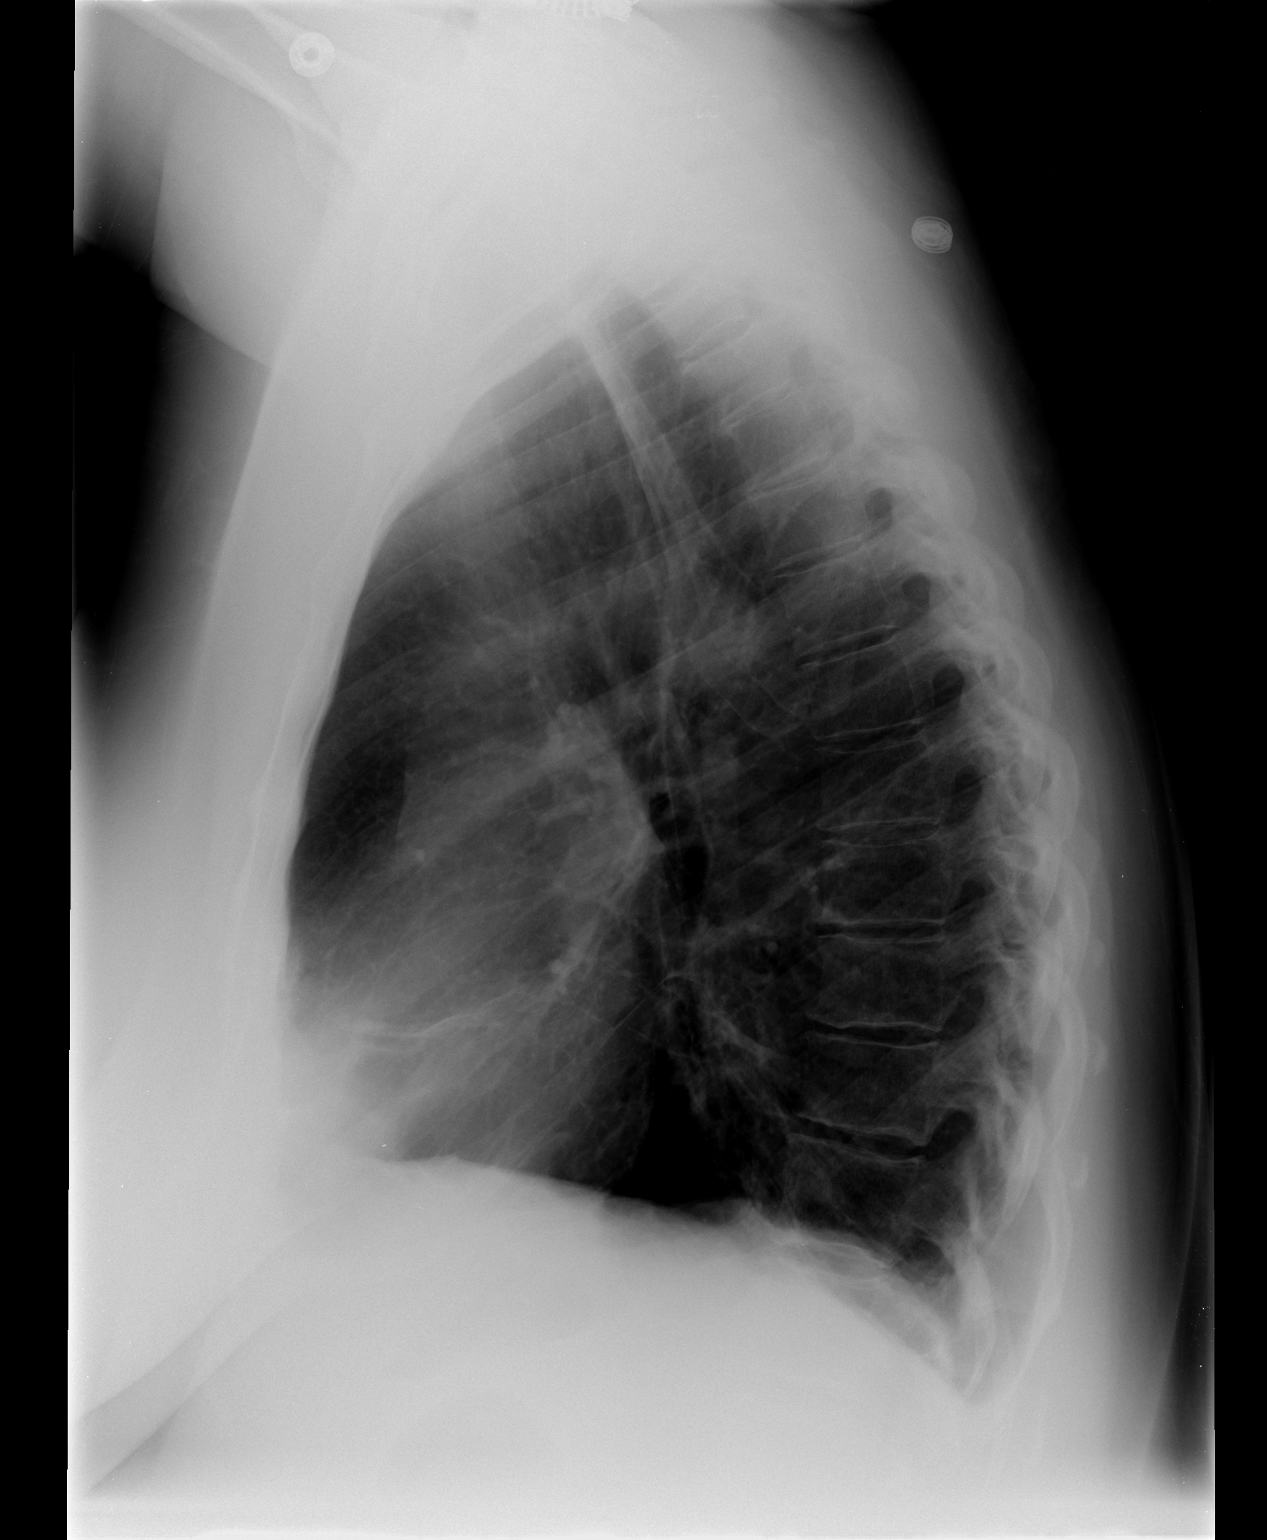

[2 of 2 positions shown; findings below may reference images not displayed]

FINDINGS: Mild bibasilar scarring versus atelectasis. No pleural
effusion or pneumothorax.

Cardiomediastinal silhouette is within normal limits.

Mild degenerative changes of the visualized thoracolumbar spine.
IMPRESSION: No evidence of acute cardiopulmonary disease.

## 2013-05-23 ENCOUNTER — Other Ambulatory Visit: Payer: Medicare Other

## 2013-05-31 ENCOUNTER — Telehealth: Payer: Self-pay | Admitting: Family Medicine

## 2013-05-31 NOTE — Telephone Encounter (Signed)
It is not standard practice that i'm aware of to order studies for an issue I'm not treating.  At this time, I don't feel comfortable authorizing this since another provider is the ordering MD.

## 2013-05-31 NOTE — Telephone Encounter (Signed)
Patient Information:  Caller Name: [name]  Phone: [phone]  Patient: [name], [name]  Gender: Female  DOB: 11-Mar-1956  Age: 57 Years  PCP: [name]  Pregnant: No  Office Follow Up:  Does the office need to follow up with this patient?: Yes  Instructions For The Office: [name] states she has an appointment on 7/24 with Chiroprator Dr. August Saucer [phone] who wants to do yearly xrays.  [employer] requesting authorization from PCP  for [employer] at [employer].   Symptoms  Reason For Call & Symptoms: [name] states she goes to Chiropractor on 7/24." Has been going for years."  Chiropractor is wanting to do xrays.  States she has thinning of  disc at L5 and S1. States her insurance  [employer]/ [employer] is requesting authorization from primary physician. Has been going to Chiropractor for years but BC/BS now wanting authorization from primary. [name] AT [phone]  Reviewed Health History In EMR: Yes  Reviewed Medications In EMR: Yes  Reviewed Allergies In EMR: Yes  Reviewed Surgeries / Procedures: Yes  Date of Onset of Symptoms: Unknown OB / GYN:  LMP: Unknown  Guideline(s) Used:  No Protocol Available - Information Only  Disposition Per Guideline:   Discuss with PCP and Callback by Nurse Today  Reason For Disposition Reached:   Nursing judgment  Advice Given:  N/A  Patient Will Follow Care Advice:  YES

## 2013-06-02 NOTE — Telephone Encounter (Signed)
Spoke with patient. Advised patient of Dr. Beverely Low recommendations. Patient states that she doesn't think that her insurance will cover the procedure. Patient good with the plan.

## 2013-06-07 ENCOUNTER — Telehealth: Payer: Self-pay | Admitting: *Deleted

## 2013-06-07 NOTE — Telephone Encounter (Signed)
Pt called office requesting samples of crestor. Samples placed up front Lot # [id number] , Exp date 08-2015.

## 2013-06-08 ENCOUNTER — Other Ambulatory Visit: Payer: Self-pay | Admitting: Family Medicine

## 2013-06-09 ENCOUNTER — Ambulatory Visit (INDEPENDENT_AMBULATORY_CARE_PROVIDER_SITE_OTHER): Payer: Medicare Other | Admitting: Family Medicine

## 2013-06-09 ENCOUNTER — Encounter: Payer: Self-pay | Admitting: Family Medicine

## 2013-06-09 VITALS — BP 118/88 | HR 68 | Temp 98.4°F | Ht 69.25 in | Wt 224.2 lb

## 2013-06-09 DIAGNOSIS — R5381 Other malaise: Secondary | ICD-10-CM

## 2013-06-09 DIAGNOSIS — R5383 Other fatigue: Secondary | ICD-10-CM

## 2013-06-09 DIAGNOSIS — F329 Major depressive disorder, single episode, unspecified: Secondary | ICD-10-CM

## 2013-06-09 DIAGNOSIS — E559 Vitamin D deficiency, unspecified: Secondary | ICD-10-CM

## 2013-06-09 LAB — BASIC METABOLIC PANEL
BUN: 18 mg/dL (ref 6–23)
GFR: 83.73 mL/min (ref >60.00)
Potassium: 4.1 mEq/L (ref 3.5–5.1)

## 2013-06-09 LAB — CBC WITH DIFFERENTIAL/PLATELET
Basophils Relative: 0.2 % (ref 0.0–3.0)
Eosinophils Absolute: 0.1 10*3/uL (ref 0.0–0.7)
MCHC: 34.1 g/dL (ref 30.0–36.0)
MCV: 90.4 fl (ref 78.0–100.0)
Monocytes Absolute: 0.3 10*3/uL (ref 0.1–1.0)
Neutrophils Relative %: 66.3 % (ref 43.0–77.0)
Platelets: 262 10*3/uL (ref 150.0–400.0)
RBC: 3.96 Mil/uL (ref 3.87–5.11)
RDW: 13.4 % (ref 11.5–14.6)

## 2013-06-09 LAB — TSH: TSH: 1.15 u[IU]/mL (ref 0.35–5.50)

## 2013-06-09 NOTE — Progress Notes (Signed)
  Subjective:    Patient ID: [name], female    DOB: 1956/05/24, 57 y.o.   MRN: [id number]  HPI Fatigue- 'very very tired'.  sxs started 3 weeks ago.  No recent med changes.  Increased stress- friend w/ brain cancer not doing well.  Very low energy.  Seeing psych regularly.  Sleeping well.  Pt reports her psych meds have never previously caused her to feel this well.   Review of Systems For ROS see HPI     Objective:   Physical Exam  Vitals reviewed. Constitutional: She is oriented to person, place, and time. She appears well-developed and well-nourished. No distress.  HENT:  Head: Normocephalic and atraumatic.  Eyes: Conjunctivae and EOM are normal. Pupils are equal, round, and reactive to light.  Neck: Normal range of motion. Neck supple. No thyromegaly present.  Cardiovascular: Normal rate, regular rhythm, normal heart sounds and intact distal pulses.   No murmur heard. Pulmonary/Chest: Effort normal and breath sounds normal. No respiratory distress.  Abdominal: Soft. She exhibits no distension. There is no tenderness.  Musculoskeletal: She exhibits no edema.  Lymphadenopathy:    She has no cervical adenopathy.  Neurological: She is alert and oriented to person, place, and time.  Skin: Skin is warm and dry.  Psychiatric: She has a normal mood and affect. Her behavior is normal.          Assessment & Plan:

## 2013-06-09 NOTE — Assessment & Plan Note (Signed)
Recurrent problem for pt.  Suspect this is due to her psych meds but pt concerned for metabolic abnormality.  Check labs.  Determine next steps based on results.  Will follow.

## 2013-06-09 NOTE — Assessment & Plan Note (Signed)
Check labs to determine if this is contributing to pt's fatigue.

## 2013-06-09 NOTE — Assessment & Plan Note (Signed)
Chronic problem for pt.  Likely contributing to her fatigue and low energy level.  Following w/ psych.  Will follow along and assist as able.

## 2013-06-09 NOTE — Patient Instructions (Addendum)
We'll notify you of your lab results and determine the next steps Try and get regular rest Find a stress outlet Call with any questions or concerns Hang in there!

## 2013-06-13 LAB — VITAMIN D 1,25 DIHYDROXY: Vitamin D2 1, 25 (OH)2: <8 pg/mL

## 2013-07-15 ENCOUNTER — Ambulatory Visit (INDEPENDENT_AMBULATORY_CARE_PROVIDER_SITE_OTHER): Payer: Medicare Other | Admitting: Internal Medicine

## 2013-07-15 ENCOUNTER — Encounter: Payer: Self-pay | Admitting: Internal Medicine

## 2013-07-15 VITALS — BP 120/70 | Temp 98.1°F | Wt 233.0 lb

## 2013-07-15 DIAGNOSIS — J0191 Acute recurrent sinusitis, unspecified: Secondary | ICD-10-CM | POA: Insufficient documentation

## 2013-07-15 DIAGNOSIS — J019 Acute sinusitis, unspecified: Secondary | ICD-10-CM

## 2013-07-15 MED ORDER — DOXYCYCLINE HYCLATE 100 MG PO CAPS: 100.0000 mg | ORAL_CAPSULE | Freq: Two times a day (BID) | ORAL | Status: DC

## 2013-07-15 NOTE — Progress Notes (Signed)
Chief Complaint  Patient presents with  . reccurent sinusitis    HPI: Patient comes in today for [facility] Saturday clinic for  new problem evaluation. Just finished 2 weeks of Levaquin  nasonex saline etc For right sinusitis   Per Dr Suszanne Conners and got better  Then  and about 3 days ago having similar sx.  Had recheck with him 5 days ago and was good   However  Right side pain congestion and tenderness soon after that getting worse  aching  Feeling   Bad all over . No hx of migraines  Hx left sinus surgery    And recurrent infections.   Doesn't take pred cause of hx of bipolar, levaquin made her very tired and had to take it at night ROS: See pertinent positives and negatives per HPI. No cp sob   Had som sweating recently and aches   Past Medical History  Diagnosis Date  . Bipolar disorder   . MVP (mitral valve prolapse)   . Hyperlipidemia   . Asthma   . Allergic rhinitis   . Arthritis     R knee, Left Knee  . Urinary tract infection     frequent, see Urololgist 12/16/2010  . Dysrhythmia     Palpations occ, takes Tenormin  . Constipation   . Complication of anesthesia     had bad anxiety after anesthesia  . Heart murmur   . Pneumonia   . Bronchitis     hx of  . Anxiety   . Constipation, chronic   . GERD (gastroesophageal reflux disease)   . Seasonal allergies     Family History  Problem Relation Age of Onset  . Coronary artery disease Father   . Heart attack Father 43  . Hypertension Brother   . Anesthesia problems Neg Hx     History   Social History  . Marital Status: Divorced    Spouse Name: N/A    Number of Children: N/A  . Years of Education: N/A   Social History Main Topics  . Smoking status: Never Smoker   . Smokeless tobacco: Never Used  . Alcohol Use: Yes     Comment: glass of wine occ.  . Drug Use: No  . Sexual Activity:    Other Topics Concern  . None   Social History Narrative   Lives alone with 2 dogs and cats. Has pet sitting business.     Outpatient Encounter Prescriptions as of 07/15/2013  Medication Sig Dispense Refill  . albuterol (PROAIR HFA) 108 (90 BASE) MCG/ACT inhaler Inhale 2 puffs into the lungs every 4 (four) hours as needed for wheezing.  1 Inhaler  6  . atenolol (TENORMIN) 50 MG tablet TAKE 1 TABLET BY MOUTH EVERY DAY  180 tablet  1  . beclomethasone (QVAR) 80 MCG/ACT inhaler Inhale 2 puffs into the lungs 2 (two) times daily.  1 Inhaler  3  . clonazePAM (KLONOPIN) 1 MG tablet Take 1 mg by mouth 4 (four) times daily as needed. For anxiety      . diclofenac (VOLTAREN) 75 MG EC tablet Take 1 tablet by mouth 2 (two) times daily as needed.      Marland Kitchen estrogens, conjugated, (PREMARIN) 0.625 MG tablet Take 0.625 mg by mouth daily.       . fenofibrate 160 MG tablet TAKE 1 TABLET (160 MG TOTAL) BY MOUTH DAILY.  90 tablet  1  . fexofenadine (ALLEGRA) 180 MG tablet Take 180 mg by mouth daily.      Marland Kitchen  fluticasone (FLONASE) 50 MCG/ACT nasal spray Place 2 sprays into the nose daily.  16 g  3  . gabapentin (NEURONTIN) 300 MG capsule Take 600 mg by mouth 3 (three) times daily.      Marland Kitchen lamoTRIgine (LAMICTAL) 200 MG tablet Take 200 mg by mouth 2 (two) times daily.       . montelukast (SINGULAIR) 10 MG tablet Take 1 tablet (10 mg total) by mouth at bedtime.  30 tablet  3  . omeprazole (PRILOSEC) 40 MG capsule Take 1 capsule (40 mg total) by mouth daily.  30 capsule  3  . rosuvastatin (CRESTOR) 10 MG tablet Take 2 tablets (20 mg total) by mouth at bedtime.  90 tablet  2  . SEROQUEL XR 150 MG 24 hr tablet Take 2 tablets by mouth at bedtime.      Marland Kitchen venlafaxine (EFFEXOR-XR) 75 MG 24 hr capsule Take 225 mg by mouth daily.       Marland Kitchen doxycycline (VIBRAMYCIN) 100 MG capsule Take 1 capsule (100 mg total) by mouth 2 (two) times daily.  20 capsule  0   No facility-administered encounter medications on file as of 07/15/2013.    EXAM:  BP 120/70  Temp(Src) 98.1 F (36.7 C) (Oral)  Wt 233 lb (105.688 kg)  BMI 34.16 kg/m2  LMP 11/09/2000  Body  mass index is 34.16 kg/(m^2).  GENERAL: vitals reviewed and listed above, alert, oriented, appears well hydrated and in no acute distress congested allergic appearing   HEENT: atraumatic, conjunctiva  clear, no obvious abnormalities on inspection of external nose and ears  Tm clear nares 2 + cong right maxilla tender no edema OP : no lesion edema or exudate mild arryhtma  NECK: no obvious masses on inspection palpation shoddy adenopathy  LUNGS: clear to auscultation bilaterally, no wheezes, rales or rhonchi, good air movement CV: HRRR, no clubbing cyanosis or  peripheral edema nl cap refill  MS: moves all extremities without noticeable focal  abnormality PSYCH: pleasant and cooperative,  ASSESSMENT AND PLAN:  Discussed the following assessment and plan:  Recurrent acute sinusitis - right s/p recent rx  relapse? cant take pred given doxy and fu with ENT continue nasal steroids saline etc   Acute sinusitis treated with antibiotics in the past 60 days   Expectant management. And fu with ENT -Patient advised to return or notify health care team  if symptoms worsen or persist or new concerns arise.  Patient Instructions  Will give different antibiotic . For now . Call Dr.   Suszanne Conners    Office on Monday . About further direction.  Compresses.   Increase nasonex twice day. For now.           [name]. [name] M.D.

## 2013-07-15 NOTE — Patient Instructions (Signed)
Will give different antibiotic . For now . Call Dr.   Suszanne Conners    Office on Monday . About further direction.  Compresses.   Increase nasonex twice day. For now.

## 2013-07-17 ENCOUNTER — Telehealth: Payer: Self-pay | Admitting: *Deleted

## 2013-07-17 NOTE — Telephone Encounter (Signed)
[facility] Triage Call Report Triage Record Num: [id number] Operator: April Finney Patient Name: [name] Call Date & Time: 07/15/2013 10:27:46AM Patient Phone: [phone] PCP: Lezlie Octave Patient Gender: Female PCP Fax : [phone] Patient DOB: May 30, 1956 Practice Name: [facility] Reason for Call: Caller: [name]/Patient; PCP: Other; CB#: [phone]; Call regarding Cough/Congestion; She feels she has another sinus infection and wants appt. Declines triage and denies any emergent symptoms. Called back line at [facility] office and appt scheduled for 12p today. Notified [name] of time and that it will be at the [place] office across from [facility] and she is aware. Protocol(s) Used: Office Note Recommended Outcome per Protocol: Information Noted and Sent to Office Reason for Outcome: Caller information to office Care Advice: ~ 09/

## 2013-07-20 ENCOUNTER — Telehealth: Payer: Self-pay | Admitting: Family Medicine

## 2013-07-20 NOTE — Telephone Encounter (Signed)
Last OV 06-2013. Last lipid panel was completed since 09/2011 (please advise if samples are ok)

## 2013-07-20 NOTE — Telephone Encounter (Signed)
Patient would like Samples:crestor 20mg   Thanks

## 2013-07-20 NOTE — Telephone Encounter (Signed)
Ok for samples but pt needs to schedule an appt to recheck cholesterol (or physical if she's due)

## 2013-07-21 ENCOUNTER — Other Ambulatory Visit: Payer: Self-pay | Admitting: General Practice

## 2013-07-21 MED ORDER — ROSUVASTATIN CALCIUM 10 MG PO TABS: 20.0000 mg | ORAL_TABLET | Freq: Every day | ORAL | Status: DC

## 2013-07-21 NOTE — Telephone Encounter (Signed)
LM notifying pt that samples are available and the need to schedule a 1 month follow up.

## 2013-07-26 ENCOUNTER — Encounter: Payer: Self-pay | Admitting: Physician Assistant

## 2013-07-26 ENCOUNTER — Ambulatory Visit (INDEPENDENT_AMBULATORY_CARE_PROVIDER_SITE_OTHER): Payer: Medicare Other | Admitting: Physician Assistant

## 2013-07-26 VITALS — BP 127/80 | HR 75 | Temp 98.6°F | Resp 17 | Wt 230.0 lb

## 2013-07-26 DIAGNOSIS — F319 Bipolar disorder, unspecified: Secondary | ICD-10-CM

## 2013-07-26 NOTE — Progress Notes (Signed)
   [address] [place] [address]   Phone [phone]  Subjective:    Patient ID: [name], female    DOB: 1956/03/02, 56 y.o.   MRN: [id number]  HPI  Pt presents to clinic for possible consultation about medication change.  She has been seeing Dr March Rummage or Saul Fordyce the PA at his office.  Dr Ledon Snare suggested that she see me but I have not had the chance to talk with him.  Pt is a little hesitant for me to change her medications but she thought she made the appt she would get my opinion.  She has had some recent stressors and she feels like her depression is not well controlled and she is having some increase in anxiety and does not feel like the Klonopin is helping her any as much as it used to.  Her mother died in 2008/04/10 and she does not feel like her depression has been good since then.  She is having increased anxiety recently - her good friend has been diagnosed with terminal brain cancer.  She sleeps ok at night because of the Seroquel.  She has been on her bipolar meds since the 1990s and feels like she has been in control but has not seen Saul Fordyce in a long time.  Pt does snore.  She is having hot flashes.   Review of Systems  Psychiatric/Behavioral: Positive for dysphoric mood. Negative for suicidal ideas, sleep disturbance and self-injury. The patient is nervous/anxious.       Objective:   Physical Exam  Vitals reviewed. Constitutional: She is oriented to person, place, and time. She appears well-developed and well-nourished.  HENT:  Head: Normocephalic and atraumatic.  Right Ear: External ear normal.  Left Ear: External ear normal.  Pulmonary/Chest: Effort normal.  Neurological: She is alert and oriented to person, place, and time.  Psychiatric: She has a normal mood and affect. Her behavior is normal. Judgment and thought content normal.      Assessment & Plan:  Bipolar disease with increased depression and anxiety due to recent stressors.  Patient and I  discussed that since she has a psychiatrist she should continue her medication evaluations with them.  She agrees with that plan.  I will call her psychiatrist and Dr Ledon Snare to alert them of our plan.  I do think that she warrants a change in her antidepressant and a possible sleep study as well as a change in her Benzo.  Benny Lennert PA-C 07/26/2013 8:03 PM

## 2013-07-27 ENCOUNTER — Other Ambulatory Visit (INDEPENDENT_AMBULATORY_CARE_PROVIDER_SITE_OTHER): Payer: Self-pay | Admitting: Otolaryngology

## 2013-07-27 DIAGNOSIS — J320 Chronic maxillary sinusitis: Secondary | ICD-10-CM

## 2013-08-04 ENCOUNTER — Other Ambulatory Visit (INDEPENDENT_AMBULATORY_CARE_PROVIDER_SITE_OTHER): Payer: Medicare Other

## 2013-08-04 DIAGNOSIS — E7800 Pure hypercholesterolemia, unspecified: Secondary | ICD-10-CM

## 2013-08-04 LAB — LIPID PANEL
Cholesterol: 223 mg/dL — ABNORMAL HIGH (ref 0–200)
HDL: 61.2 mg/dL (ref >39.00)
Total CHOL/HDL Ratio: 4
Triglycerides: 211 mg/dL — ABNORMAL HIGH (ref 0.0–149.0)
VLDL: 42.2 mg/dL — ABNORMAL HIGH (ref 0.0–40.0)

## 2013-08-04 LAB — HEPATIC FUNCTION PANEL
Albumin: 4.7 g/dL (ref 3.5–5.2)
Alkaline Phosphatase: 29 U/L — ABNORMAL LOW (ref 39–117)
Total Protein: 7.6 g/dL (ref 6.0–8.3)

## 2013-08-04 LAB — LDL CHOLESTEROL, DIRECT: Direct LDL: 144.4 mg/dL

## 2013-08-07 ENCOUNTER — Other Ambulatory Visit: Payer: Medicare Other

## 2013-08-08 ENCOUNTER — Ambulatory Visit
Admission: RE | Admit: 2013-08-08 | Discharge: 2013-08-08 | Disposition: A | Discharge status: Home / Self Care | Payer: Medicare Other | Source: Ambulatory Visit | Attending: Otolaryngology | Admitting: Otolaryngology

## 2013-08-08 ENCOUNTER — Other Ambulatory Visit (INDEPENDENT_AMBULATORY_CARE_PROVIDER_SITE_OTHER): Payer: Self-pay | Admitting: Otolaryngology

## 2013-08-08 ENCOUNTER — Other Ambulatory Visit: Payer: Medicare Other

## 2013-08-08 DIAGNOSIS — J320 Chronic maxillary sinusitis: Secondary | ICD-10-CM

## 2013-08-09 ENCOUNTER — Telehealth: Payer: Self-pay | Admitting: *Deleted

## 2013-08-09 NOTE — Telephone Encounter (Signed)
Based on the research, pt does not need abx prior to a dental cleaning for her artificial knee.  'In 2013, the ADA and AAOS published a joint guideline on the prevention of orthopedic implant infections in patients undergoing dental procedures; it states that there is no convincing evidence to support routine use of prophylactic antibiotics in patients with prosthetic joints who undergo dental procedures'

## 2013-08-09 NOTE — Telephone Encounter (Signed)
Spoke with pt, she is requesting an antibiotic for a dental cleaning. Pt wants it sent to [facility] on [address].

## 2013-08-09 NOTE — Telephone Encounter (Signed)
Spoke with pt. States that Dr. Thurston Hole will write Rx for antibiotics.

## 2013-08-09 NOTE — Telephone Encounter (Signed)
I answered the question to the best of my ability and if he would like her to have antibiotics, he can write the prescription.  This is the info listed in UpToDate.

## 2013-08-09 NOTE — Telephone Encounter (Signed)
Pt states that per Dr. Thurston Hole that your information is incorrect. States that she has to have antibiotics an hour before her appt due to her titanium knees.

## 2013-08-17 ENCOUNTER — Ambulatory Visit: Payer: Medicare Other | Admitting: Pharmacist

## 2013-08-22 ENCOUNTER — Ambulatory Visit: Payer: Medicare Other | Admitting: Pharmacist

## 2013-08-23 ENCOUNTER — Ambulatory Visit (INDEPENDENT_AMBULATORY_CARE_PROVIDER_SITE_OTHER): Payer: Medicare Other | Admitting: Pharmacist

## 2013-08-23 VITALS — Wt 236.0 lb

## 2013-08-23 DIAGNOSIS — Z79899 Other long term (current) drug therapy: Secondary | ICD-10-CM

## 2013-08-23 DIAGNOSIS — E785 Hyperlipidemia, unspecified: Secondary | ICD-10-CM

## 2013-08-23 MED ORDER — ROSUVASTATIN CALCIUM 20 MG PO TABS: 20.0000 mg | ORAL_TABLET | Freq: Every day | ORAL | Status: DC

## 2013-08-23 NOTE — Progress Notes (Signed)
HPI  [name] is a 57 yo F who is seen for follow up visit in Lipid Clinic.   Patient was in good spirits but fairly concerned with her cholesterol. She does not have any other significant co-morbidities (No CAD, HTN, DM, not a smoker). Her family history is significant for her father who died of an MI at age 61. She also has a brother w/ high cholesterol but like her has no other significant comorbidities.  She has gained 7 lbs over past few months as she hasn't been able to exercise given her multiple sinus infections.  She is having a sinus procedure done tomorrow by ENT, and will hopefully be able to start back at the gym again in the near future.  Risk factors include age (>19) and family h/o CAD.    LDL goal < 130, non-HDL goal < 160 at least.   She is taking and tolerating both Crestor 20 mg qd and fenofibrate 160 mg.  She states she is taking her fenofibrate on an empty stomach.  Intolerant:  She has tried Niaspan in the past and experienced intolerable flushing. Patient also did not tolerate atorvastatin in the past but wasn't sure why. She reports GI upset from Zetia as well per phone note 10/13  Patient is taking oral HRT (Premarin) which was given to her by [facility].  She has never tried the patch form.  TG can elevate from oral estrogen.  Diet -  Her diet has been worse recently. Breakfast is typically oatmeal or a protein shake.  Lunch is usually her largest meal and she will often go out for lunch for fast food or [employer]. She does try to eat salads when possible when she goes out to eat. . Dinner is often small and fairly inconsistent. She does snack frequently/stress eat on things like chips/crackers/candy.  Drinks soda on occasion, and rarely drinks alcohol.  She feels stress eating is contributing greatly to her weight.  Exercise - none for the past few months.  She states her sinus issues have prevented her from exercising as she feels fatigued and has hard  time breathing.  Hopes to restart exercising after having this sinus procedure tomorrow.  She has joined [employer] but has not gone yet.  07/2013 labs:  TC:  223;  TG 211;  HDL 61;  LDL 144 (Crestor 20 mg + fenofibrate 160 mg)   07/2012:  TC:  238, LDL 101, TG 387;  LDL-P#  1191 (Crestor 10 mg + fenofibrate 160 mg)  Current Outpatient Prescriptions on File Prior to Visit  Medication Sig Dispense Refill  . albuterol (PROAIR HFA) 108 (90 BASE) MCG/ACT inhaler Inhale 2 puffs into the lungs every 4 (four) hours as needed for wheezing.  1 Inhaler  6  . atenolol (TENORMIN) 50 MG tablet TAKE 1 TABLET BY MOUTH EVERY DAY  180 tablet  1  . beclomethasone (QVAR) 80 MCG/ACT inhaler Inhale 2 puffs into the lungs 2 (two) times daily.  1 Inhaler  3  . clonazePAM (KLONOPIN) 1 MG tablet Take 1 mg by mouth 4 (four) times daily as needed. For anxiety      . estrogens, conjugated, (PREMARIN) 0.625 MG tablet Take 0.625 mg by mouth daily.       . fenofibrate 160 MG tablet TAKE 1 TABLET (160 MG TOTAL) BY MOUTH DAILY.  90 tablet  1  . fexofenadine (ALLEGRA) 180 MG tablet Take 180 mg by mouth daily.      Marland Kitchen  fluticasone (FLONASE) 50 MCG/ACT nasal spray Place 2 sprays into the nose daily.  16 g  3  . gabapentin (NEURONTIN) 300 MG capsule Take 900 mg by mouth 3 (three) times daily.       Marland Kitchen lamoTRIgine (LAMICTAL) 200 MG tablet Take 200 mg by mouth 2 (two) times daily.       . montelukast (SINGULAIR) 10 MG tablet Take 1 tablet (10 mg total) by mouth at bedtime.  30 tablet  3  . omeprazole (PRILOSEC) 40 MG capsule Take 1 capsule (40 mg total) by mouth daily.  30 capsule  3  . rosuvastatin (CRESTOR) 10 MG tablet Take 2 tablets (20 mg total) by mouth at bedtime.  56 tablet  0  . SEROQUEL XR 150 MG 24 hr tablet Take 2 tablets by mouth at bedtime.      Marland Kitchen venlafaxine (EFFEXOR-XR) 75 MG 24 hr capsule Take 225 mg by mouth daily.        No current facility-administered medications on file prior to visit.    Allergies   Allergen Reactions  . Nitrofurantoin Monohyd Macro Nausea Only    Severe headache  . Alprazolam   . Ciprofloxacin     Made her hyper  . Sulfa Antibiotics Nausea And Vomiting  . Amoxicillin Rash

## 2013-08-23 NOTE — Assessment & Plan Note (Signed)
LDL appears to have gone up over past year, however TC similar - I suspect the LDL appears higher as TG have come down over past year, and thus "unmasking" her true LDL.  Her estrogen is likely a source of elevated TG, non-HDL, and LDL-P, and this would likely improve with a topical form of HRT.  Fenofibrate needs to be taken with the largest meal of the day, unless only ~ 60% of the drug is absorbed due to low bioavailability without food.  Patient starting to exercise again following sinus procedure will assist not only in weight loss, but also in lowering cholesterol further.  We discussed different dietary interventions she should make, and discussed some healthy options if she needed to "stress eat".  She will try to eat healther alternative such as a handful of almonds/walnuts.  Given family h/o premature disease, and h/o elevated LDL-P, will check an NMR in 3 months (goal at least < 1300 LDL-P).    Plan: 1.  STart taking fenofibrate 160 mg everyday with largest meal of the day to improve bioavailability. 2.  Continue Crestor 20 mg qd. 3.  Talk with OB/GYN about changing premarin to Vivelle Dot or Climara patch. 4.  Exercise at gym 5 days per week - at least 30-40 minutes per day  - after sinus procedure has been done.  Patient tells me her goal is to lose 60 lbs. 5.  Low fat snacks, eating out less, and avoiding sodas.   6.  Recheck NMR and liver fxn in 3 months, then see me 1 week after labs.  If LDL-P well above 1300 may need to discuss changing therapy. Labs set up, Crestor rx sent to pharmacy, appt made.

## 2013-08-23 NOTE — Patient Instructions (Signed)
Plan:   1.  STart taking fenofibrate with largest meal of the day, everyday. 2.  Continue Crestor 20 mg qd daily. 3.  [facility] to [facility] about changing estrogen to patch - Vivelle Dot or Climara would be appropriate.  Elevated TG due to oral estrogen. 4.  After sinus surgery tomorrow, try to get back to the gym 5 days per week - walk at least 30 minutes daily. 5.  Reduce eating out.  Instead of stress eating with bags of candy, eat a handful of walnuts/almonds when you need a snack. 6.  Avoid sodas. 7.  Recheck cholesterol with me in 3 months (see me 1 week after).

## 2013-09-14 ENCOUNTER — Other Ambulatory Visit: Payer: Self-pay

## 2013-09-29 ENCOUNTER — Other Ambulatory Visit: Payer: Self-pay | Admitting: Family Medicine

## 2013-09-29 NOTE — Telephone Encounter (Signed)
Med filled.  

## 2013-10-09 ENCOUNTER — Encounter: Payer: Self-pay | Admitting: Family Medicine

## 2013-10-09 ENCOUNTER — Ambulatory Visit: Payer: Medicare Other

## 2013-10-09 ENCOUNTER — Ambulatory Visit (INDEPENDENT_AMBULATORY_CARE_PROVIDER_SITE_OTHER): Payer: Medicare Other | Admitting: Family Medicine

## 2013-10-09 VITALS — BP 126/78 | HR 76 | Temp 98.5°F | Resp 16 | Ht 69.75 in | Wt 235.2 lb

## 2013-10-09 DIAGNOSIS — Z0000 Encounter for general adult medical examination without abnormal findings: Secondary | ICD-10-CM

## 2013-10-09 DIAGNOSIS — J4540 Moderate persistent asthma, uncomplicated: Secondary | ICD-10-CM | POA: Insufficient documentation

## 2013-10-09 DIAGNOSIS — Z23 Encounter for immunization: Secondary | ICD-10-CM

## 2013-10-09 DIAGNOSIS — E669 Obesity, unspecified: Secondary | ICD-10-CM | POA: Insufficient documentation

## 2013-10-09 DIAGNOSIS — E785 Hyperlipidemia, unspecified: Secondary | ICD-10-CM

## 2013-10-09 DIAGNOSIS — J45909 Unspecified asthma, uncomplicated: Secondary | ICD-10-CM

## 2013-10-09 DIAGNOSIS — E559 Vitamin D deficiency, unspecified: Secondary | ICD-10-CM

## 2013-10-09 DIAGNOSIS — R7309 Other abnormal glucose: Secondary | ICD-10-CM

## 2013-10-09 LAB — HEMOGLOBIN A1C: Hgb A1c MFr Bld: 6.3 % (ref 4.6–6.5)

## 2013-10-09 LAB — CBC WITH DIFFERENTIAL/PLATELET
Basophils Absolute: 0 10*3/uL (ref 0.0–0.1)
Eosinophils Absolute: 0.1 10*3/uL (ref 0.0–0.7)
HCT: 37.7 % (ref 36.0–46.0)
Hemoglobin: 13 g/dL (ref 12.0–15.0)
Lymphocytes Relative: 26.4 % (ref 12.0–46.0)
Lymphs Abs: 1.1 10*3/uL (ref 0.7–4.0)
MCHC: 34.5 g/dL (ref 30.0–36.0)
MCV: 89 fl (ref 78.0–100.0)
Monocytes Absolute: 0.3 10*3/uL (ref 0.1–1.0)
Neutro Abs: 2.7 10*3/uL (ref 1.4–7.7)
Platelets: 265 10*3/uL (ref 150.0–400.0)
RDW: 13.9 % (ref 11.5–14.6)

## 2013-10-09 LAB — BASIC METABOLIC PANEL
BUN: 19 mg/dL (ref 6–23)
Calcium: 9.8 mg/dL (ref 8.4–10.5)
Creatinine, Ser: 0.7 mg/dL (ref 0.4–1.2)
Potassium: 4.6 mEq/L (ref 3.5–5.1)

## 2013-10-09 LAB — TSH: TSH: 1.39 u[IU]/mL (ref 0.35–5.50)

## 2013-10-09 NOTE — Assessment & Plan Note (Signed)
Chronic problem.  Encouraged regular, aerobic activity for 30 minutes at least 4x/week and monitoring caloric intake w/ the help of MyFitnessPal app.  Will follow.

## 2013-10-09 NOTE — Assessment & Plan Note (Signed)
Chronic problem, check labs.  Replete prn. 

## 2013-10-09 NOTE — Assessment & Plan Note (Signed)
New.  Pt currently on Qvar daily as a maintenance med.  Refill Singulair today.  Continue Albuterol PRN.  Reviewed supportive care and red flags that should prompt return.  Pt expressed understanding and is in agreement w/ plan.

## 2013-10-09 NOTE — Assessment & Plan Note (Signed)
Chronic problem, following now w/ lipid clinic.  Will follow along.

## 2013-10-09 NOTE — Progress Notes (Signed)
   Subjective:    Patient ID: [name], female    DOB: 1956/08/15, 57 y.o.   MRN: [id number]  HPI Pre visit review using our clinic review tool, if applicable. No additional management support is needed unless otherwise documented below in the visit note.  Here today for CPE.  Risk Factors: Hyperlipidemia- following w/ lipid clinic. On Crestor and Fenofibrate.  Asking for Crestor samples. Vit D deficiency- due for labs Asthma- chronic problem, on Qvar and singulair regularly, albuterol prn.  Pt reports sxs are well controlled. Physical Activity: no formal exercise Fall Risk: low risk Depression: chronic problem, following w/ psych Hearing: normal to conversational tones and whispered voice ADL's: independent Cognitive: normal linear thought process, memory and attention intact Home Safety: safe at home Height, Weight, BMI, Visual Acuity: see vitals, vision corrected to 20/20 w/ glasses Counseling: UTD on colonoscopy, mammo, and pap (GYN- [facility]) Labs Ordered: See A&P Care Plan: See A&P    Review of Systems Patient reports no vision/ hearing changes, adenopathy,fever, weight change,  persistant/recurrent hoarseness , swallowing issues, chest pain, palpitations, edema, persistant/recurrent cough, hemoptysis, dyspnea (rest/exertional/paroxysmal nocturnal), gastrointestinal bleeding (melena, rectal bleeding), abdominal pain, significant heartburn, bowel changes, GU symptoms (dysuria, hematuria, incontinence), Gyn symptoms (abnormal  bleeding, pain),  syncope, focal weakness, memory loss, numbness & tingling, skin/hair/nail changes, abnormal bruising or bleeding, anxiety, or depression.     Objective:   Physical Exam General Appearance:    Alert, cooperative, no distress, appears stated age, overweight  Head:    Normocephalic, without obvious abnormality, atraumatic  Eyes:    PERRL, conjunctiva/corneas clear, EOM's intact, fundi    benign, both eyes  Ears:    Normal TM's and  external ear canals, both ears  Nose:   Nares normal, septum midline, mucosa normal, no drainage    or sinus tenderness  Throat:   Lips, mucosa, and tongue normal; teeth and gums normal  Neck:   Supple, symmetrical, trachea midline, no adenopathy;    Thyroid: no enlargement/tenderness/nodules  Back:     Symmetric, no curvature, ROM normal, no CVA tenderness  Lungs:     Clear to auscultation bilaterally, respirations unlabored  Chest Wall:    No tenderness or deformity   Heart:    Regular rate and rhythm, S1 and S2 normal, no murmur, rub   or gallop  Breast Exam:    Deferred to GYN  Abdomen:     Soft, non-tender, bowel sounds active all four quadrants,    no masses, no organomegaly  Genitalia:    Deferred to GYN  Rectal:    Extremities:   Extremities normal, atraumatic, no cyanosis or edema  Pulses:   2+ and symmetric all extremities  Skin:   Skin color, texture, turgor normal, no rashes or lesions  Lymph nodes:   Cervical, supraclavicular, and axillary nodes normal  Neurologic:   CNII-XII intact, normal strength, sensation and reflexes    throughout          Assessment & Plan:

## 2013-10-09 NOTE — Patient Instructions (Signed)
Follow up in 1 year for your physical- sooner if needed We'll notify you of your lab results and make any changes if needed Keep up the good work! Call with any questions or concerns Happy Holidays!!!

## 2013-10-09 NOTE — Assessment & Plan Note (Signed)
Pt's PE WNL.  UTD on health maintenance, continues to see GYN.  Check labs.  Anticipatory guidance provided.

## 2013-10-12 LAB — VITAMIN D 1,25 DIHYDROXY
Vitamin D 1, 25 (OH)2 Total: 55 pg/mL (ref 18–72)
Vitamin D2 1, 25 (OH)2: <8 pg/mL

## 2013-10-30 ENCOUNTER — Ambulatory Visit (INDEPENDENT_AMBULATORY_CARE_PROVIDER_SITE_OTHER): Payer: Medicare Other | Admitting: Internal Medicine

## 2013-10-30 ENCOUNTER — Encounter: Payer: Self-pay | Admitting: Internal Medicine

## 2013-10-30 VITALS — BP 133/74 | HR 66 | Temp 99.0°F | Wt 237.0 lb

## 2013-10-30 DIAGNOSIS — J40 Bronchitis, not specified as acute or chronic: Secondary | ICD-10-CM

## 2013-10-30 MED ORDER — DOXYCYCLINE HYCLATE 100 MG PO TABS: 100.0000 mg | ORAL_TABLET | Freq: Two times a day (BID) | ORAL | Status: DC

## 2013-10-30 NOTE — Progress Notes (Signed)
   Subjective:    Patient ID: [name], female    DOB: [birth year], 57 y.o.   MRN: [id number]  HPI Acute visit Sx started 5 days ago: Had fever initially. + Sore throat, sinus congestion, mild chest congestion, headaches, feeling achy and tired. She started ciprofloxacin few days ago, she had a leftover antibiotic. Did not see any improvement after abx started   Past Medical History  Diagnosis Date  . Bipolar disorder   . MVP (mitral valve prolapse)   . Hyperlipidemia   . Asthma   . Allergic rhinitis   . Arthritis     R knee, Left Knee  . Urinary tract infection     frequent, see Urololgist 12/16/2010  . Dysrhythmia     Palpations occ, takes Tenormin  . Constipation   . Complication of anesthesia     had bad anxiety after anesthesia  . Heart murmur   . Pneumonia   . Bronchitis     hx of  . Anxiety   . Constipation, chronic   . GERD (gastroesophageal reflux disease)   . Seasonal allergies   . Depression   . Cataract    Past Surgical History  Procedure Laterality Date  . Nasal sinus surgery  01/2010    x2  . Back surgery  2001    L5 - S1  . Abdominal hysterectomy  2002    no oophorectomy  . Tonsillectomy    . Total knee arthroplasty  12/23/2011    Procedure: TOTAL KNEE ARTHROPLASTY;  Surgeon: Loreta Ave, MD;  Location[facility];  Service: Orthopedics;  Laterality: Right;  DR MURPHY WANTS 120 MINUTES FOR SURGERY  . Total knee arthroplasty  08/31/2012    left knee  . Total knee arthroplasty  08/31/2012    Procedure: TOTAL KNEE ARTHROPLASTY;  Surgeon: Loreta Ave, MD;  Location: [facility];  Service: Orthopedics;  Laterality: Left;    Review of Systems Asthma symptoms have increased, using albuterol more frequently. Denies nausea, vomiting, diarrhea.     Objective:   Physical Exam BP 133/74  Pulse 66  Temp(Src) 99 F (37.2 C)  Wt 237 lb (107.502 kg)  SpO2 99%  LMP 11/09/2000 General -- alert, well-developed, NAD.  no toxic appearing HEENT-- Not pale.  TMs normal, throat symmetric, no redness or discharge. Face symmetric, sinuses not tender to palpation. Nose slt congested.  Lungs -- normal respiratory effort, no intercostal retractions, no accessory muscle use, few ronchi B and end expiratory wheezing. Heart-- normal rate, regular rhythm, no murmur.   Neurologic--  alert & oriented X3. Speech normal  Psych-- Cognition and judgment appear intact. Cooperative with normal attention span and concentration. No anxious appearing , no depressed appearing.         Assessment & Plan:  Bronchitis Discontinue ciprofloxacin, start Z-Pak Temporarily change to Symbicort, see instructions

## 2013-10-30 NOTE — Patient Instructions (Signed)
Rest, fluids , tylenol For cough, take Mucinex DM twice a day as needed  For congestion use OTC Nasocort: 2 nasal sprays on each side of the nose daily until you feel better  For 2 weeks: Stop Qvar, use symbicort 2 puffs twice a day (sample) Then go back to Qvar  Take the antibiotic as prescribed  (doxy) Call if no better in few days Call anytime if the symptoms are severe

## 2013-10-30 NOTE — Progress Notes (Signed)
Pre visit review using our clinic review tool, if applicable. No additional management support is needed unless otherwise documented below in the visit note. 

## 2013-11-29 ENCOUNTER — Other Ambulatory Visit: Payer: Medicare Other

## 2013-12-06 ENCOUNTER — Other Ambulatory Visit (INDEPENDENT_AMBULATORY_CARE_PROVIDER_SITE_OTHER): Payer: Medicare Other

## 2013-12-06 ENCOUNTER — Ambulatory Visit: Payer: Medicare Other | Admitting: Pharmacist

## 2013-12-06 DIAGNOSIS — E785 Hyperlipidemia, unspecified: Secondary | ICD-10-CM

## 2013-12-06 DIAGNOSIS — Z79899 Other long term (current) drug therapy: Secondary | ICD-10-CM

## 2013-12-06 LAB — HEPATIC FUNCTION PANEL
ALBUMIN: 4.4 g/dL (ref 3.5–5.2)
ALT: 16 U/L (ref 0–35)
AST: 14 U/L (ref 0–37)
Alkaline Phosphatase: 30 U/L — ABNORMAL LOW (ref 39–117)
Bilirubin, Direct: 0 mg/dL (ref 0.0–0.3)
Total Bilirubin: 0.5 mg/dL (ref 0.3–1.2)
Total Protein: 7.2 g/dL (ref 6.0–8.3)

## 2013-12-07 LAB — NMR LIPOPROFILE WITH LIPIDS
Cholesterol, Total: 230 mg/dL — ABNORMAL HIGH (ref <200)
HDL PARTICLE NUMBER: 52.3 umol/L (ref >=30.5)
HDL Size: 8.6 nm — ABNORMAL LOW (ref >=9.2)
HDL-C: 61 mg/dL (ref >=40)
LDL (calc): 117 mg/dL — ABNORMAL HIGH (ref <100)
LDL PARTICLE NUMBER: 2103 nmol/L — AB (ref <1000)
LDL SIZE: 20 nm — AB (ref >20.5)
LP-IR Score: 81 — ABNORMAL HIGH (ref <=45)
Large HDL-P: 4.8 umol/L (ref >=4.8)
Large VLDL-P: 8.9 nmol/L — ABNORMAL HIGH (ref <=2.7)
Small LDL Particle Number: 1422 nmol/L — ABNORMAL HIGH (ref <=527)
Triglycerides: 258 mg/dL — ABNORMAL HIGH (ref <150)
VLDL Size: 52.8 nm — ABNORMAL HIGH (ref <=46.6)

## 2013-12-13 ENCOUNTER — Ambulatory Visit (INDEPENDENT_AMBULATORY_CARE_PROVIDER_SITE_OTHER): Payer: Medicare Other | Admitting: Pharmacist

## 2013-12-13 ENCOUNTER — Other Ambulatory Visit: Payer: Self-pay | Admitting: Family Medicine

## 2013-12-13 VITALS — Wt 231.0 lb

## 2013-12-13 DIAGNOSIS — E785 Hyperlipidemia, unspecified: Secondary | ICD-10-CM

## 2013-12-13 DIAGNOSIS — Z79899 Other long term (current) drug therapy: Secondary | ICD-10-CM

## 2013-12-13 MED ORDER — PSYLLIUM 30.9 % PO POWD: ORAL | Status: DC

## 2013-12-13 NOTE — Assessment & Plan Note (Addendum)
Patient's diet has improved, starting working out at the gym last month, and has lost 5 lbs over past few weeks.  Despite this, her cholesterol is no better, most likely due to her running out of fenofibrate 10-14 days prior to her lab work in 11/2013.  She is now back on fenofibrate and is looking for a part time job to make sure she can afford her medications moving forward.  Crestor hasn't been an issue with cost, and fenofibrate isn't too expensive ($90 for 90 day supply).  I recommended using metamucil twice daily b/t her meals as she wants to be full so she won't snack on "junk" during the day.  I told her she could add fish oil 2 g/d if she would like as she was interested in this, but I think further weight loss and increase in exercise will make the biggest impact at this point.  She will discuss HRT in patch form with ob/gyn next visit as well.  I gave her a few examples of shakes she could do in the morning that would be both healthy and keep her full during the day.  She is to continue her exercise, and hopefully increase endurance moving forward. Plan: 1.  Continue fenofibrate 160 mg with largest meal of the day. 2.  Continue Crestor 20 mg qd. 3.  Start metamucil powder twice daily - use 1 tablespoon in 8 ounces of water and drink twice daily.  Do this between meals. 4.  If you want to start fish oil, okay to use 1,000 mg capsules  - 2 per day to start with. 5.  Breakfast options for your blender / bullet:  1).  Unsweetened almond milk, frozen strawberry and blueberry, banana, kale, and ice.  Can add whey protein powder if you would like.  2).  Unsweetened almond milk (small amount), peanut butter, banana, ice.  3).  Orange, kale, banana, ice, agave nectar (sweetner), lime wedge. 6.  Continue oatmeal other mornings. 7.  Talk to [facility] about changing you estrogen to patch form for cholesterol purposes. 8.  Continue working out at gym 3 days per week.  Goal 30 minutes on  treadmill/bike - work up to this.  Continue walking dogs daily. 9.  Continue to avoid fast food, and eat vegetables for lunch / salad. 10.  Recheck cholesterol in 3 months (5/18 - fasting), and see Ysidro Evert 03/29/14 at 10 am

## 2013-12-13 NOTE — Patient Instructions (Signed)
1.  Continue fenofibrate 160 mg with largest meal of the day. 2.  Continue Crestor 20 mg qd. 3.  Start metamucil powder twice daily - use 1 tablespoon in 8 ounces of water and drink twice daily.  Do this between meals. 4.  If you want to start fish oil, okay to use 1,000 mg capsules  - 2 per day to start with. 5.  Breakfast options for your blender / bullet:  1).  Unsweetened almond milk, frozen strawberry and blueberry, banana, kale, and ice.  Can add whey protein powder if you would like.  2).  Unsweetened almond milk (small amount), peanut butter, banana, ice.  3).  Orange, kale, banana, ice, agave nectar (sweetner), lime wedge. 6.  Continue oatmeal other mornings. 7.  Talk to [facility] about changing you estrogen to patch form for cholesterol purposes. 8.  Continue working out at gym 3 days per week.  Goal 30 minutes on treadmill/bike - work up to this.  Continue walking dogs daily. 9.  Continue to avoid fast food, and eat vegetables for lunch / salad. 10.  Recheck cholesterol in 3 months (5/18 - fasting), and see Ysidro Evert 03/29/14 at 10 am

## 2013-12-13 NOTE — Progress Notes (Signed)
HPI  [name] is a 58 yo F who is seen for follow up visit in [facility].   She is tolerating Crestor 20 mg qd and fenofibrate 160 mg well, however she tells me that she ran out of fibrate for 10 days prior to her most recent lab work being done.  She can get a 90 day supply of fenofibrate for $90, but she was short on money last month, but is back on it now.  Her brother helps her with living expenses typically, but hasn't been able to do this lately.  She is trying to get a part time job now to make sure she doesn't run out of meds moving forward.  She does not have any other significant co-morbidities (No CAD, HTN, DM, not a smoker). Her family history is significant for her father who died of an MI at age 87. She also has a brother w/ high cholesterol but like her has no other significant comorbidities.  She has lost 5 lbs over past month since starting to work out again.  Her chronic sinus infections have resolved as well so this isn't preventing her from working out.  She is now slowly working out again, building up her endurance.  She asks about fish oil and drinking a shake in the morning to help curb her appetite.   Risk factors include age (>98) and family h/o CAD.  LDL goal < 130, non-HDL goal < 160 at least. She is taking and tolerating both Crestor 20 mg qd and fenofibrate 160 mg.  Is now taking fenofibrate with full meal, but ran out of fibrate 10 days prior to most recent labs (thus numbers look higher). Intolerant:  She has tried Niaspan in the past and experienced intolerable flushing. Patient also did not tolerate atorvastatin in the past but wasn't sure why. She reports GI upset from Zetia as well per phone note 10/13  Patient is taking oral HRT (Premarin) still which was given to her by [facility].  She has never tried the patch form.  TG can elevate from oral estrogen.  She will see her ob/gyn soon and will discuss making this change.  Diet -  Diet has improved as she is  eating oatmeal most mornings, and eating less fast food for lunch.  Lunch is often a salad or half a sub sandwich.  Dinner she sometimes skips, but is trying to eat a meat and vegetable most nights.  Patient rarely drinks soda or alcohol.  She is stress eating less than she use to, especially now that she is working out.  She would like to start drinking some type of shake or protein drink, and wants my suggestion of what types to drink.  Exercise - She just started back in the gym last month.  Currently only going 3 times per week and working out on treadmill for 5-10 minutes as she is building up her endurance.  Her goal is to get up to at least 30 minutes a day, 3 days per week.  Labs: 11/2013  LDL-P number 2103 (goal < 1300), LDL 117, HDL 61, TG 258, TC 230, LFTs normal (Crestor 20 mg qd, Fenofibrate 160 mg w/ food - she was off fibrate 10 days prior to this lab work; back on now) 07/2013  TC:  223;  TG 211;  HDL 61;  LDL 144 (Crestor 20 mg + fenofibrate 160 mg)  07/2012:  TC:  238, LDL 101, TG 387;  LDL-P#  2136 (Crestor 10 mg + fenofibrate 160 mg)  Current Outpatient Prescriptions on File Prior to Visit  Medication Sig Dispense Refill  . albuterol (PROAIR HFA) 108 (90 BASE) MCG/ACT inhaler Inhale 2 puffs into the lungs every 4 (four) hours as needed for wheezing.  1 Inhaler  6  . atenolol (TENORMIN) 50 MG tablet TAKE 1 TABLET BY MOUTH EVERY DAY  180 tablet  1  . beclomethasone (QVAR) 80 MCG/ACT inhaler Inhale 2 puffs into the lungs 2 (two) times daily.  1 Inhaler  3  . clonazePAM (KLONOPIN) 1 MG tablet Take 1 mg by mouth 4 (four) times daily as needed. For anxiety      . estrogens, conjugated, (PREMARIN) 0.625 MG tablet Take 0.625 mg by mouth daily.       . fexofenadine (ALLEGRA) 180 MG tablet Take 180 mg by mouth daily.      Marland Kitchen gabapentin (NEURONTIN) 300 MG capsule Take 900 mg by mouth 3 (three) times daily.       Marland Kitchen lamoTRIgine (LAMICTAL) 200 MG tablet Take 200 mg by mouth 2 (two) times daily.        . montelukast (SINGULAIR) 10 MG tablet Take 1 tablet (10 mg total) by mouth at bedtime.  30 tablet  3  . omeprazole (PRILOSEC) 40 MG capsule TAKE 1 CAPSULE BY MOUTH ONCE DAILY  30 capsule  2  . rosuvastatin (CRESTOR) 20 MG tablet Take 1 tablet (20 mg total) by mouth daily.  30 tablet  5  . SEROQUEL XR 150 MG 24 hr tablet Take 2 tablets by mouth at bedtime.      Marland Kitchen venlafaxine (EFFEXOR-XR) 75 MG 24 hr capsule Take 225 mg by mouth daily.        No current facility-administered medications on file prior to visit.    Allergies  Allergen Reactions  . Nitrofurantoin Monohyd Macro Nausea Only    Severe headache  . Alprazolam   . Sulfa Antibiotics Nausea And Vomiting  . Amoxicillin Rash  . Prednisone Anxiety

## 2013-12-13 NOTE — Telephone Encounter (Signed)
Med filled.  

## 2014-02-01 ENCOUNTER — Encounter: Payer: Self-pay | Admitting: Internal Medicine

## 2014-02-02 ENCOUNTER — Other Ambulatory Visit: Payer: Self-pay | Admitting: Family Medicine

## 2014-02-05 NOTE — Telephone Encounter (Signed)
Med filled.  

## 2014-02-08 ENCOUNTER — Telehealth: Payer: Self-pay | Admitting: Family Medicine

## 2014-02-08 NOTE — Telephone Encounter (Signed)
Pt notified and samples placed at the front desk for pick up.

## 2014-02-08 NOTE — Telephone Encounter (Signed)
Patient called and requested a QVAR 80 MCG/ACT inhaler sample  because she states they are expensive at the pharmacy. Please advise

## 2014-02-22 ENCOUNTER — Ambulatory Visit (INDEPENDENT_AMBULATORY_CARE_PROVIDER_SITE_OTHER): Payer: Medicare Other | Admitting: Pulmonary Disease

## 2014-02-22 ENCOUNTER — Encounter: Payer: Self-pay | Admitting: Pulmonary Disease

## 2014-02-22 ENCOUNTER — Ambulatory Visit (INDEPENDENT_AMBULATORY_CARE_PROVIDER_SITE_OTHER)
Admission: RE | Admit: 2014-02-22 | Discharge: 2014-02-22 | Disposition: A | Discharge status: Home / Self Care | Payer: Medicare Other | Source: Ambulatory Visit | Attending: Pulmonary Disease | Admitting: Pulmonary Disease

## 2014-02-22 VITALS — BP 118/64 | HR 76 | Temp 99.8°F | Ht 69.0 in | Wt 229.8 lb

## 2014-02-22 DIAGNOSIS — R059 Cough, unspecified: Secondary | ICD-10-CM

## 2014-02-22 DIAGNOSIS — R05 Cough: Secondary | ICD-10-CM

## 2014-02-22 DIAGNOSIS — R053 Chronic cough: Secondary | ICD-10-CM

## 2014-02-22 MED ORDER — TRAMADOL HCL 50 MG PO TABS: 50.0000 mg | ORAL_TABLET | Freq: Four times a day (QID) | ORAL | Status: DC | PRN

## 2014-02-22 NOTE — Addendum Note (Signed)
Addended by: Virl Cagey on: 02/22/2014 10:47 AM   Modules accepted: Orders

## 2014-02-22 NOTE — Progress Notes (Signed)
   Subjective:    Patient ID: [name], female    DOB: [birth year], 57 y.o.   MRN: [id number]  HPI The patient is a 58 year old female who I've been asked to see for chronic cough. The patient has a history of cough dating back about 20 years, and it is only minimally worse over the years. She has a tickle either in her throat or upper chest the leads to her cough, and she does have issues with throat clearing at times. The cough is typically dry in nature, and can often lead to hoarseness. She was diagnosed with asthma about 4-5 years ago after seeing an allergist, but is unsure if she ever had spirometry. She has never had an asthma attack, or breathing issues that would cause her to go to the emergency room or hospital. She has been started on Qvar by an allergist, and she thinks that it did make a difference. She has not had a recent chest x-ray. She has a history of chronic sinus disease and is followed by otolaryngology. A recent CT of her sinuses last year showed mild chronic changes, but no air-fluid levels. She admits to ongoing postnasal drip that could be an issue, but has not had any purulence from her nose. However, she currently is having some type of upper respiratory issue that sounds viral in nature. The patient does have a history of reflux symptoms, and is currently taking omeprazole. Despite this, she occasionally has breakthrough reflux symptoms. She feels that her breathing is not overly abnormal, and that her exertional tolerance is completely stable.   Review of Systems  Constitutional: Positive for fever and diaphoresis. Negative for unexpected weight change.  HENT: Positive for postnasal drip and rhinorrhea. Negative for congestion, dental problem, ear pain, nosebleeds, sinus pressure, sneezing, sore throat and trouble swallowing.   Eyes: Negative for redness and itching.  Respiratory: Positive for cough and wheezing. Negative for chest tightness and shortness of breath.    Cardiovascular: Negative for palpitations and leg swelling.  Gastrointestinal: Positive for nausea. Negative for vomiting.  Genitourinary: Negative for dysuria.  Musculoskeletal: Negative for joint swelling.  Skin: Negative for rash.  Neurological: Positive for headaches.  Hematological: Does not bruise/bleed easily.  Psychiatric/Behavioral: Negative for dysphoric mood. The patient is not nervous/anxious.        Objective:   Physical Exam Constitutional:  Overweight female, no acute distress  HENT:  Nares patent without discharge, mild turbinate hypertrophy  Oropharynx without exudate, palate and uvula are elongated.  +drainage noted posteriorly  Eyes:  Perrla, eomi, no scleral icterus  Neck:  No JVD, no TMG  Cardiovascular:  Normal rate, regular rhythm, no rubs or gallops.  No murmurs        Intact distal pulses  Pulmonary :  Normal breath sounds, no stridor or respiratory distress   No rales, rhonchi, or wheezing  Abdominal:  Soft, nondistended, bowel sounds present.  No tenderness noted.   Musculoskeletal:  No lower extremity edema noted.  Lymph Nodes:  No cervical lymphadenopathy noted  Skin:  No cyanosis noted  Neurologic:  Alert, appropriate, moves all 4 extremities without obvious deficit.         Assessment & Plan:

## 2014-02-22 NOTE — Patient Instructions (Signed)
Take chlorpheniramine 4mg  otc, 2 tabs at bedtime and one at lunch for next 3 weeks Take tramadol 50mg  one every 6hrs if needed for cough. Do not clear your throat!  The cough will not improve if this continues. Keep hard candy, no mint or menthol, in your mouth all during the day to help with cough and throat clearing. Increase your omeprazole to one in am AND pm until next visit. Can take mucinex dm otc for your current cold symptoms.  Let me know if you feel getting worse.  Continue qvar for now, but will discuss stopping at next visit.  Will check chest xray today and call you with results. followup with me in 3 weeks.

## 2014-02-22 NOTE — Assessment & Plan Note (Signed)
The patient has a chronic cough of 20 years duration, and I suspect this is more upper airway in origin than lower. Her lungs are totally clear today, and her spirometry is totally normal as well. This does not mean that she does not have asthma, but simply that her current symptoms are not being caused by asthma. At this point, I would like to treat this as an upper airway cough, and we'll work on more aggressive intervention for postnasal drip, reflux, and the irritable larynx syndrome. She is clearing her throat constantly during our visit, and I have told her this will not result in total she quits doing that. I have outlined behavioral therapy to try and help with this, and will also try tramadol. Will check a chest x-ray for completeness. I will leave her on her Qvar for now, but it is unclear to me whether she even has asthma. If her cough does improve, I will discontinue the Qvar and check spirometry again at a later date. If she continues to have issues without improvement, I may have otolaryngology do an upper airway exam to make sure there is no abnormal pathology.

## 2014-02-22 NOTE — Addendum Note (Signed)
Addended by: Mathis Bud on: 02/22/2014 10:45 AM   Modules accepted: Orders

## 2014-02-23 ENCOUNTER — Telehealth: Payer: Self-pay | Admitting: Pulmonary Disease

## 2014-02-23 NOTE — Telephone Encounter (Signed)
Called and spoke with pt and she is aware of [place] on her meds.  Pt voiced her understanding and nothing further is needed.

## 2014-02-23 NOTE — Telephone Encounter (Signed)
She is to continue on singulair She can take allegra during the day if she takes chlorpheniramine only at night.

## 2014-02-23 NOTE — Telephone Encounter (Signed)
Pt wants to know if she should stop Allegra and Singulair while she is taking Chlorpheniramine.  Please advise.

## 2014-02-27 ENCOUNTER — Telehealth: Payer: Self-pay | Admitting: Pulmonary Disease

## 2014-02-27 NOTE — Telephone Encounter (Signed)
Notes Recorded by Kathee Delton, MD on 02/23/2014 at 5:44 PM Please let pt know that her cxr looks pretty good. Has a small area of lung that is not getting enough air, but is not the cause of her cough --  Called spoke with pt. She wants to know what he means by an area of lung no getting enough air and what we need to do about this? Please advise [place] thanks

## 2014-02-27 NOTE — Telephone Encounter (Signed)
Just what I said.  A very very small area of lung that is not inflated, and usually related to not taking a deep breath or sometimes a plug of mucus in the airway.

## 2014-02-27 NOTE — Telephone Encounter (Signed)
Called and spoke with pt and she is aware of cxr results per [facility].  Pt voiced her understanding and nothing further is needed.

## 2014-02-28 NOTE — Telephone Encounter (Signed)
I called and made pt aware of results. Nothing further needed

## 2014-03-09 ENCOUNTER — Ambulatory Visit (INDEPENDENT_AMBULATORY_CARE_PROVIDER_SITE_OTHER): Payer: Medicare Other | Admitting: Family Medicine

## 2014-03-09 ENCOUNTER — Encounter: Payer: Self-pay | Admitting: Family Medicine

## 2014-03-09 VITALS — BP 120/68 | HR 70 | Temp 98.2°F | Resp 16 | Wt 228.0 lb

## 2014-03-09 DIAGNOSIS — R5381 Other malaise: Secondary | ICD-10-CM

## 2014-03-09 DIAGNOSIS — R5383 Other fatigue: Secondary | ICD-10-CM

## 2014-03-09 LAB — HEPATIC FUNCTION PANEL
ALT: 23 U/L (ref 0–35)
AST: 20 U/L (ref 0–37)
Albumin: 4.5 g/dL (ref 3.5–5.2)
Alkaline Phosphatase: 28 U/L — ABNORMAL LOW (ref 39–117)
BILIRUBIN DIRECT: 0 mg/dL (ref 0.0–0.3)
BILIRUBIN TOTAL: 0.4 mg/dL (ref 0.3–1.2)
Total Protein: 7.5 g/dL (ref 6.0–8.3)

## 2014-03-09 LAB — CBC WITH DIFFERENTIAL/PLATELET
BASOS PCT: 0.2 % (ref 0.0–3.0)
Basophils Absolute: 0 10*3/uL (ref 0.0–0.1)
EOS PCT: 1 % (ref 0.0–5.0)
Eosinophils Absolute: 0.1 10*3/uL (ref 0.0–0.7)
HEMATOCRIT: 37 % (ref 36.0–46.0)
Hemoglobin: 12.7 g/dL (ref 12.0–15.0)
Lymphocytes Relative: 15.7 % (ref 12.0–46.0)
Lymphs Abs: 0.8 10*3/uL (ref 0.7–4.0)
MCHC: 34.3 g/dL (ref 30.0–36.0)
MCV: 89.7 fl (ref 78.0–100.0)
MONO ABS: 0.3 10*3/uL (ref 0.1–1.0)
MONOS PCT: 5.6 % (ref 3.0–12.0)
Neutro Abs: 4 10*3/uL (ref 1.4–7.7)
Neutrophils Relative %: 77.5 % — ABNORMAL HIGH (ref 43.0–77.0)
Platelets: 262 10*3/uL (ref 150.0–400.0)
RBC: 4.12 Mil/uL (ref 3.87–5.11)
RDW: 13.7 % (ref 11.5–14.6)
WBC: 5.2 10*3/uL (ref 4.5–10.5)

## 2014-03-09 LAB — BASIC METABOLIC PANEL
BUN: 16 mg/dL (ref 6–23)
CALCIUM: 9.7 mg/dL (ref 8.4–10.5)
CO2: 24 mEq/L (ref 19–32)
Chloride: 105 mEq/L (ref 96–112)
Creatinine, Ser: 0.7 mg/dL (ref 0.4–1.2)
GFR: 96.57 mL/min (ref >60.00)
Glucose, Bld: 90 mg/dL (ref 70–99)
Potassium: 3.8 mEq/L (ref 3.5–5.1)
SODIUM: 140 meq/L (ref 135–145)

## 2014-03-09 LAB — B12 AND FOLATE PANEL
FOLATE: 24.7 ng/mL (ref >5.9)
VITAMIN B 12: 316 pg/mL (ref 211–911)

## 2014-03-09 LAB — TSH: TSH: 0.81 u[IU]/mL (ref 0.35–5.50)

## 2014-03-09 LAB — HEMOGLOBIN A1C: HEMOGLOBIN A1C: 6.1 % (ref 4.6–6.5)

## 2014-03-09 MED ORDER — MONTELUKAST SODIUM 10 MG PO TABS: 10.0000 mg | ORAL_TABLET | Freq: Every day | ORAL | Status: DC

## 2014-03-09 NOTE — Assessment & Plan Note (Signed)
Recurrent issue for pt.  No evidence of infxn.  Suspect this is a component of seasonal allergies, depression, and deconditioning.  Check labs to r/o metabolic causes of fatigue.  Encouraged regular activity, healthy diet.  Will follow.

## 2014-03-09 NOTE — Progress Notes (Signed)
   Subjective:    Patient ID: [name], female    DOB: 1956/08/29, 58 y.o.   MRN: [id number]  HPI Fatigue- 'i just have not felt well'.  Decreased energy, low motivation, increased fatigue.  Saw Dr Gwenette Greet on 4/16 for chronic cough- had normal spirometry, CXR.  Sleeping well.  Pt reports increased stress recently- hx of severe depression.  Pt reports fatigue for 'a couple of months'.  No CP, SOB, HAs, visual changes, edema, N/V/D.   Review of Systems For ROS see HPI     Objective:   Physical Exam  Vitals reviewed. Constitutional: She is oriented to person, place, and time. She appears well-developed and well-nourished. No distress.  HENT:  Head: Normocephalic and atraumatic.  Nose: Nose normal.  Mouth/Throat: Oropharynx is clear and moist.  TMs WNL bilaterally No TTP over sinuses  Eyes: Conjunctivae and EOM are normal. Pupils are equal, round, and reactive to light.  Neck: Normal range of motion. Neck supple. No thyromegaly present.  Cardiovascular: Normal rate, regular rhythm, normal heart sounds and intact distal pulses.   No murmur heard. Pulmonary/Chest: Effort normal and breath sounds normal. No respiratory distress.  Abdominal: Soft. She exhibits no distension. There is no tenderness.  Musculoskeletal: She exhibits no edema.  Lymphadenopathy:    She has no cervical adenopathy.  Neurological: She is alert and oriented to person, place, and time.  Skin: Skin is warm and dry.  Psychiatric: She has a normal mood and affect. Her behavior is normal.          Assessment & Plan:

## 2014-03-09 NOTE — Progress Notes (Signed)
Pre visit review using our clinic review tool, if applicable. No additional management support is needed unless otherwise documented below in the visit note. 

## 2014-03-09 NOTE — Patient Instructions (Signed)
Follow up as needed We'll notify you of your lab results and make any changes if needed Try and get regular exercise to build your stamina and improve your energy Call with any questions or concerns Hang in there!!!

## 2014-03-14 LAB — VITAMIN D 1,25 DIHYDROXY
VITAMIN D3 1, 25 (OH): 65 pg/mL
Vitamin D 1, 25 (OH)2 Total: 65 pg/mL (ref 18–72)
Vitamin D2 1, 25 (OH)2: <8 pg/mL

## 2014-03-15 ENCOUNTER — Ambulatory Visit: Payer: Medicare Other | Admitting: Pulmonary Disease

## 2014-03-19 ENCOUNTER — Telehealth: Payer: Self-pay | Admitting: Family Medicine

## 2014-03-19 NOTE — Telephone Encounter (Signed)
Caller name:[name] Relation to [place]:OTRRNHA Call back [place]:  Reason for call: to request her recent labs be sent to [facility] [place] # [phone]

## 2014-03-19 NOTE — Telephone Encounter (Signed)
Labs faxed per pt request. Pt notified.

## 2014-03-22 ENCOUNTER — Telehealth: Payer: Self-pay

## 2014-03-22 NOTE — Telephone Encounter (Signed)
Patient called in for samples of crestor and had to reschedule her appointment for lab and lipid clinic. Placed samples up front

## 2014-03-26 ENCOUNTER — Other Ambulatory Visit: Payer: Medicare Other

## 2014-03-27 ENCOUNTER — Encounter: Payer: Medicare Other | Admitting: Internal Medicine

## 2014-03-29 ENCOUNTER — Ambulatory Visit: Payer: Medicare Other | Admitting: Pharmacist

## 2014-04-13 ENCOUNTER — Ambulatory Visit (INDEPENDENT_AMBULATORY_CARE_PROVIDER_SITE_OTHER): Payer: Medicare Other

## 2014-04-13 DIAGNOSIS — Z79899 Other long term (current) drug therapy: Secondary | ICD-10-CM

## 2014-04-13 DIAGNOSIS — E785 Hyperlipidemia, unspecified: Secondary | ICD-10-CM

## 2014-04-13 LAB — HEPATIC FUNCTION PANEL
ALT: 21 U/L (ref 0–35)
AST: 21 U/L (ref 0–37)
Albumin: 4.7 g/dL (ref 3.5–5.2)
Alkaline Phosphatase: 31 U/L — ABNORMAL LOW (ref 39–117)
BILIRUBIN DIRECT: 0.1 mg/dL (ref 0.0–0.3)
TOTAL PROTEIN: 7.3 g/dL (ref 6.0–8.3)
Total Bilirubin: 0.7 mg/dL (ref 0.2–1.2)

## 2014-04-16 ENCOUNTER — Other Ambulatory Visit: Payer: Medicare Other

## 2014-04-16 LAB — NMR LIPOPROFILE WITH LIPIDS

## 2014-04-17 ENCOUNTER — Ambulatory Visit (INDEPENDENT_AMBULATORY_CARE_PROVIDER_SITE_OTHER): Payer: Medicare Other | Admitting: *Deleted

## 2014-04-17 DIAGNOSIS — E785 Hyperlipidemia, unspecified: Secondary | ICD-10-CM

## 2014-04-18 LAB — NMR LIPOPROFILE WITH LIPIDS
CHOLESTEROL, TOTAL: 234 mg/dL — AB (ref <200)
HDL PARTICLE NUMBER: 46 umol/L (ref >=30.5)
HDL Size: 8.5 nm — ABNORMAL LOW (ref >=9.2)
HDL-C: 62 mg/dL (ref >=40)
LARGE HDL: 1.6 umol/L — AB (ref >=4.8)
LDL (calc): 134 mg/dL — ABNORMAL HIGH (ref <100)
LDL Particle Number: 2075 nmol/L — ABNORMAL HIGH (ref <1000)
LDL Size: 21.2 nm (ref >20.5)
LP-IR Score: 66 — ABNORMAL HIGH (ref <=45)
Large VLDL-P: 5 nmol/L — ABNORMAL HIGH (ref <=2.7)
SMALL LDL PARTICLE NUMBER: 818 nmol/L — AB (ref <=527)
TRIGLYCERIDES: 190 mg/dL — AB (ref <150)
VLDL SIZE: 45.3 nm (ref <=46.6)

## 2014-04-19 ENCOUNTER — Encounter: Payer: Self-pay | Admitting: Pharmacist

## 2014-04-19 ENCOUNTER — Ambulatory Visit (INDEPENDENT_AMBULATORY_CARE_PROVIDER_SITE_OTHER): Payer: Medicare Other | Admitting: Pharmacist Clinician (PhC)/ Clinical Pharmacy Specialist

## 2014-04-19 VITALS — Ht 69.0 in | Wt 218.8 lb

## 2014-04-19 DIAGNOSIS — E785 Hyperlipidemia, unspecified: Secondary | ICD-10-CM

## 2014-04-19 MED ORDER — ROSUVASTATIN CALCIUM 40 MG PO TABS: 40.0000 mg | ORAL_TABLET | Freq: Every day | ORAL | Status: DC

## 2014-04-19 NOTE — Progress Notes (Signed)
HPI  [name] is a 58 yo F who is seen for follow up visit in [facility].   She is tolerating Crestor 20 mg qd and fenofibrate 160 mg well.  She has not had any further problems with affording or picking up her medications.  She has been working hard on her diet for the past several months, and noted that some of her pants are loose enough for her to need a belt.  Since her visit on May 1, she has lost 10 pounds.  She has cut most breads and fast foods from her diet, increased the number of salads/vegetables.  Her family history is significant for her father who died of an MI at age 80. She also has a brother w/ high cholesterol but like her has no other significant comorbidities. She does not have any other significant co-morbidities (No CAD, HTN, DM, not a smoker)  Risk factors include age (>19) and family h/o CAD.   LDL goal < 130, non-HDL goal < 160 at least.  She is taking and tolerating both Crestor 20 mg qd and fenofibrate 160 mg.  Is now taking fenofibrate with full meal  Intolerant:  She has tried Niaspan in the past and experienced intolerable flushing. Patient also did not tolerate atorvastatin in the past but wasn't sure why. She reports GI upset from Zetia as well per phone note 10/13  Patient is taking oral HRT (Premarin) still which was given to her by [facility].  She spoke with her GYN about going to the patches, but was unsuccessful with both patches and topical cream.  Her menopausal symptoms were severe enough that she went back to the Premarin 0.625 tablets.  She states they spoke of trying to wean her off in the near future, as she has been taking for close to 5 years now.   Diet -  Diet has improved as she is eating oatmeal most mornings, and eating less fast food for lunch.  Lunch is often a salad or half a sub sandwich.  Dinner she sometimes skips, but is trying to eat a meat and vegetable most nights.  Patient rarely drinks soda or alcohol.  She is stress eating  less than she use to, especially now that she is working out.  She has started drinking a whey powder drink, mixing it with skim milk or water and sometimes adding fruit.    Exercise - states that it varies, but is walking 30 minutes at least 3-4 times per week, has not been to gym much recently  Labs: 03/2014  LDL-P number 2075 (goal <1300), LDL 134, HDL 62, TG 190, TC 234  11/2013  LDL-P number 2103 (goal < 1300), LDL 117, HDL 61, TG 258, TC 230, LFTs normal (Crestor 20 mg qd, Fenofibrate 160 mg w/ food - she was off fibrate 10 days prior to this lab work; back on now) 07/2013  TC:  223;  TG 211;  HDL 61;  LDL 144 (Crestor 20 mg + fenofibrate 160 mg)  07/2012:  TC:  238, LDL 101, TG 387;  LDL-P#  2136 (Crestor 10 mg + fenofibrate 160 mg)  Current Outpatient Prescriptions on File Prior to Visit  Medication Sig Dispense Refill  . albuterol (PROAIR HFA) 108 (90 BASE) MCG/ACT inhaler Inhale 2 puffs into the lungs every 4 (four) hours as needed for wheezing.  1 Inhaler  6  . atenolol (TENORMIN) 50 MG tablet TAKE 1 TABLET BY MOUTH EVERY DAY  180  tablet  1  . clonazePAM (KLONOPIN) 1 MG tablet Take 1 mg by mouth 4 (four) times daily as needed. For anxiety      . estrogens, conjugated, (PREMARIN) 0.625 MG tablet Take 0.625 mg by mouth daily.       . fenofibrate 160 MG tablet TAKE 1 TABLET BY MOUTH EVERY DAY  90 tablet  1  . fexofenadine (ALLEGRA) 180 MG tablet Take 180 mg by mouth daily.      Marland Kitchen gabapentin (NEURONTIN) 300 MG capsule Take 900 mg by mouth 3 (three) times daily.       Marland Kitchen lamoTRIgine (LAMICTAL) 200 MG tablet Take 200 mg by mouth 2 (two) times daily.       . montelukast (SINGULAIR) 10 MG tablet Take 1 tablet (10 mg total) by mouth at bedtime.  30 tablet  3  . omeprazole (PRILOSEC) 40 MG capsule TAKE 1 CAPSULE BY MOUTH ONCE DAILY  30 capsule  2  . Psyllium (METAMUCIL) 30.9 % POWD 1 tablespoon mixed in 8 ounces of water twice daily between meals      . QVAR 80 MCG/ACT inhaler INHALE 2 PUFFS INTO  THE LUNGS 2 (TWO) TIMES DAILY.  8.7 g  3  . rosuvastatin (CRESTOR) 20 MG tablet Take 1 tablet (20 mg total) by mouth daily.  30 tablet  5  . SEROQUEL XR 150 MG 24 hr tablet Take 150 mg by mouth at bedtime.       . traMADol (ULTRAM) 50 MG tablet Take 1 tablet (50 mg total) by mouth every 6 (six) hours as needed (cough).  30 tablet  1  . venlafaxine (EFFEXOR-XR) 75 MG 24 hr capsule Take 225 mg by mouth daily.        No current facility-administered medications on file prior to visit.    Allergies  Allergen Reactions  . Nitrofurantoin Monohyd Macro Nausea Only    Severe headache  . Alprazolam   . Sulfa Antibiotics Nausea And Vomiting  . Amoxicillin Rash  . Prednisone Anxiety    Weight 218.8 lbs (down from 228 on 5/1)  ASSESSMENT AND PLAN:  While her LDL and HDL levels are at goal, her LDL-P is still quite elevated, as are her TGs.  At her last visit, [name] recommended that she take Metamucil fiber twice daily between meals to help lower her cholesterol as well as help decrease her appetite.  Pt states she has not done that yet.  We will have her start that now, as she was concerned about taste/grittiness of Metamucil, I suggested Benefiber as a "tasteless" option.  We will also increase her Crestor to 40 daily.  I praised her weight loss and diet/exercise changes and encouraged her to keep working towards her goal weight.  We will repeat labs in 3 months and have her follow up with [name] at that time.  [name] PharmD CPP [facility]

## 2014-04-19 NOTE — Patient Instructions (Signed)
Increase Crestor to 40 mg daily.  If you develop muscle aches cut back to 20 mg  Start fiber supplement twice daily - Benefiber is tasteless.  Use twice daily between meals  Continue to increase exercise as tolerated  Continue to work on [employer] loss/diet.  Great job in losing 10 pounds in past month!    Repeat labs in 3 months, then see [name] several days later.

## 2014-05-16 ENCOUNTER — Ambulatory Visit (INDEPENDENT_AMBULATORY_CARE_PROVIDER_SITE_OTHER): Payer: Medicare Other | Admitting: Family Medicine

## 2014-05-16 ENCOUNTER — Encounter: Payer: Self-pay | Admitting: Family Medicine

## 2014-05-16 VITALS — BP 124/80 | HR 63 | Temp 98.0°F | Resp 16 | Wt 222.0 lb

## 2014-05-16 DIAGNOSIS — R5381 Other malaise: Secondary | ICD-10-CM

## 2014-05-16 DIAGNOSIS — R5383 Other fatigue: Principal | ICD-10-CM

## 2014-05-16 NOTE — Progress Notes (Signed)
Pre visit review using our clinic review tool, if applicable. No additional management support is needed unless otherwise documented below in the visit note. 

## 2014-05-16 NOTE — Patient Instructions (Signed)
Follow up as needed We'll call you with your Sleep appt- do not stress! Start and OTC Vit D supplement 2000 units daily if not already taking Call with any questions or concerns Hang in there!!!

## 2014-05-16 NOTE — Progress Notes (Signed)
   Subjective:    Patient ID: Waylynn Benefiel, female    DOB: Mar 28, 1956, 58 y.o.   MRN: [id number]  Headache    Fatigue- recurrent issue for pt.  This current bout started 2 weeks ago.  Reports adequate sleep.  Has never had a sleep study.  Pt has hx of loud snoring.  Pt reports fatigue will be severe and will have difficulty driving.  No recent med changes but she is on multiple sedating meds.  Pt had labs done in May and everything WNL.   Review of Systems  Neurological: Positive for headaches.   For ROS see HPI     Objective:   Physical Exam  Vitals reviewed. Constitutional: She is oriented to person, place, and time. She appears well-developed and well-nourished. No distress.  HENT:  Head: Normocephalic and atraumatic.  Eyes: Conjunctivae and EOM are normal. Pupils are equal, round, and reactive to light.  Neck: Normal range of motion. Neck supple. No thyromegaly present.  Cardiovascular: Normal rate, regular rhythm, normal heart sounds and intact distal pulses.   No murmur heard. Pulmonary/Chest: Effort normal and breath sounds normal. No respiratory distress.  Abdominal: Soft. She exhibits no distension. There is no tenderness.  Musculoskeletal: She exhibits no edema.  Lymphadenopathy:    She has no cervical adenopathy.  Neurological: She is alert and oriented to person, place, and time.  Skin: Skin is warm and dry.  Psychiatric: She has a normal mood and affect. Her behavior is normal.          Assessment & Plan:

## 2014-05-16 NOTE — Assessment & Plan Note (Signed)
Recurrent problem.  Reviewed recent labs w/ pt- no abnormalities.  Pt does report hx of loud snoring.  Has never had sleep study.  Due to ongoing fatigue and excessive daytime sleepiness, will refer for sleep evaluation.  Pt expressed understanding and is in agreement w/ plan.

## 2014-05-18 ENCOUNTER — Telehealth: Payer: Self-pay | Admitting: Family Medicine

## 2014-05-18 NOTE — Telephone Encounter (Signed)
Caller name: Alexah  Call back Mather   Reason for call:  Pt cant see Dr. Gwenette Greet until 07/10/14 for sleep study.  Pt wants to know other options if available that may be sooner.   Any suggestions.

## 2014-05-28 ENCOUNTER — Telehealth: Payer: Self-pay | Admitting: Pharmacist

## 2014-05-28 NOTE — Telephone Encounter (Signed)
New message     Pt was talking to Ysidro Evert and had to hang up----she needs to discuss what type of cholesterol medication she can switch to

## 2014-05-28 NOTE — Telephone Encounter (Signed)
Patient unable to tolerate Crestor 40 mg once daily. She tolerated 20 mg qd well historically.  She was unable to tolerate lipitor in the past due to muscle aches as well.  Cost is an issue currently, so she would like to get samples if possible, as she feels her finances will improve next month.  Crestor 20 mg samples #42 pills left up front for patient.

## 2014-06-22 ENCOUNTER — Encounter: Payer: Self-pay | Admitting: Family Medicine

## 2014-06-22 ENCOUNTER — Ambulatory Visit (INDEPENDENT_AMBULATORY_CARE_PROVIDER_SITE_OTHER): Payer: Medicare Other | Admitting: Family Medicine

## 2014-06-22 VITALS — BP 114/75 | HR 70 | Temp 97.8°F | Wt 213.0 lb

## 2014-06-22 DIAGNOSIS — M5126 Other intervertebral disc displacement, lumbar region: Secondary | ICD-10-CM

## 2014-06-22 DIAGNOSIS — M5127 Other intervertebral disc displacement, lumbosacral region: Secondary | ICD-10-CM | POA: Insufficient documentation

## 2014-06-22 MED ORDER — TRAMADOL HCL 50 MG PO TABS: 50.0000 mg | ORAL_TABLET | Freq: Four times a day (QID) | ORAL | Status: DC | PRN

## 2014-06-22 NOTE — Progress Notes (Signed)
   Subjective:    Patient ID: [name], female    DOB: Dec 10, 1955, 58 y.o.   MRN: [id number]  HPI Back pain- pt has hx of L5-S1 disc rupture in 'early 2000' after roller blading.  Had initial surgery w/ Dr Luiz Ochoa.  Pt has been seeing Chiropractic but pain has deteriorated and 'it will rip through my hips and down my legs'.  Pt is interested in seeing Dr Vertell Limber.   Review of Systems For ROS see HPI     Objective:   Physical Exam  Vitals reviewed. Constitutional: She is oriented to person, place, and time. She appears well-developed and well-nourished. No distress.  HENT:  Head: Normocephalic and atraumatic.  Musculoskeletal: She exhibits no edema and no tenderness.  Neurological: She is alert and oriented to person, place, and time. She has normal reflexes. No cranial nerve deficit. Coordination normal.  Skin: Skin is warm and dry.          Assessment & Plan:

## 2014-06-22 NOTE — Progress Notes (Signed)
Pre visit review using our clinic review tool, if applicable. No additional management support is needed unless otherwise documented below in the visit note. 

## 2014-06-22 NOTE — Patient Instructions (Signed)
Follow up as needed We'll call you with your Neurosurg appt Call with any questions or concerns Hang in there!!!

## 2014-06-24 NOTE — Assessment & Plan Note (Signed)
New to provider, hx of similar for pt.  Has had surgery previous.  Asking for referral for 2nd opinion.  Will follow.

## 2014-06-25 ENCOUNTER — Other Ambulatory Visit: Payer: Self-pay | Admitting: Family Medicine

## 2014-06-26 NOTE — Telephone Encounter (Signed)
Med filled.  

## 2014-07-10 ENCOUNTER — Institutional Professional Consult (permissible substitution): Payer: Medicare Other | Admitting: Pulmonary Disease

## 2014-07-14 ENCOUNTER — Other Ambulatory Visit: Payer: Self-pay | Admitting: Family Medicine

## 2014-07-19 ENCOUNTER — Other Ambulatory Visit: Payer: Self-pay | Admitting: Family Medicine

## 2014-07-19 NOTE — Telephone Encounter (Signed)
Med filled.  

## 2014-07-20 ENCOUNTER — Telehealth: Payer: Self-pay | Admitting: Family Medicine

## 2014-07-20 DIAGNOSIS — R5383 Other fatigue: Secondary | ICD-10-CM

## 2014-07-20 NOTE — Telephone Encounter (Signed)
Caller name:Horton Minola Relation to DZ:HGDJ Call back number:[phone] Pharmacy:  Reason for call: pt states [name] suggested that she have a sleep study done. Pt needing a referral to: [facility], pt states insurance will pay for it. Pt has Agency PPO, [phone].

## 2014-07-20 NOTE — Telephone Encounter (Signed)
Referral placed.

## 2014-07-20 NOTE — Telephone Encounter (Signed)
Please advise if ok for sleep study?

## 2014-07-20 NOTE — Telephone Encounter (Signed)
Ok for referral?

## 2014-07-26 ENCOUNTER — Ambulatory Visit: Payer: Medicare Other | Admitting: Pharmacist

## 2014-07-30 ENCOUNTER — Telehealth: Payer: Self-pay | Admitting: *Deleted

## 2014-07-30 NOTE — Telephone Encounter (Signed)
Crestor samples placed at the front desk for patient. 

## 2014-08-07 ENCOUNTER — Telehealth: Payer: Self-pay | Admitting: *Deleted

## 2014-08-07 NOTE — Telephone Encounter (Signed)
Spoke with patient to cancel appt for 08/09/14, confirmed and patient will call back to reschedule

## 2014-08-08 ENCOUNTER — Telehealth: Payer: Self-pay | Admitting: Neurology

## 2014-08-08 NOTE — Telephone Encounter (Signed)
Called patient and left Vm message for rescheduled appt,

## 2014-08-09 ENCOUNTER — Institutional Professional Consult (permissible substitution): Payer: Medicare Other | Admitting: Neurology

## 2014-08-15 ENCOUNTER — Ambulatory Visit: Payer: Medicare Other | Admitting: Pharmacist

## 2014-08-15 ENCOUNTER — Telehealth: Payer: Self-pay | Admitting: *Deleted

## 2014-08-15 ENCOUNTER — Encounter: Payer: Self-pay | Admitting: Family Medicine

## 2014-08-15 ENCOUNTER — Ambulatory Visit (INDEPENDENT_AMBULATORY_CARE_PROVIDER_SITE_OTHER): Payer: Medicare Other | Admitting: Family Medicine

## 2014-08-15 ENCOUNTER — Telehealth: Payer: Self-pay | Admitting: Family Medicine

## 2014-08-15 VITALS — BP 120/81 | HR 77 | Temp 99.1°F | Ht 69.0 in | Wt 218.0 lb

## 2014-08-15 DIAGNOSIS — J209 Acute bronchitis, unspecified: Secondary | ICD-10-CM

## 2014-08-15 MED ORDER — AZITHROMYCIN 250 MG PO TABS: ORAL_TABLET | ORAL | Status: DC

## 2014-08-15 NOTE — Progress Notes (Signed)
   Subjective:    Patient ID: [name], female    DOB: May 20, 1956, 58 y.o.   MRN: [id number]  HPI Here for 3 days of fever to 99.9 degrees, ST, chest tightness and coughing up green sputum. Using her inhaler.    Review of Systems  Constitutional: Positive for fever.  HENT: Positive for congestion and postnasal drip. Negative for sinus pressure.   Eyes: Negative.   Respiratory: Positive for cough, chest tightness and wheezing. Negative for shortness of breath.   Cardiovascular: Negative.        Objective:   Physical Exam  Constitutional: She appears well-developed and well-nourished.  HENT:  Right Ear: External ear normal.  Left Ear: External ear normal.  Nose: Nose normal.  Mouth/Throat: Oropharynx is clear and moist.  Eyes: Conjunctivae are normal.  Pulmonary/Chest: Effort normal. No respiratory distress. She has no rales.  Scattered rhonchi and wheezes   Lymphadenopathy:    She has no cervical adenopathy.          Assessment & Plan:  Add Mucinex. Recheck prn

## 2014-08-15 NOTE — Telephone Encounter (Addendum)
Caller name: Shellyann  Relation to pt: self  Call back number: [phone]   Reason for call:   Pt states she does not want to go to  [facility]  on [address]. Pt states she is tired of it and there not doing anything different as when she comes to visit you

## 2014-08-15 NOTE — Progress Notes (Signed)
Pre visit review using our clinic review tool, if applicable. No additional management support is needed unless otherwise documented below in the visit note. 

## 2014-08-15 NOTE — Telephone Encounter (Signed)
Patient calling to cancel appointment with Dr Rexene Alberts on 08/16/14 due to having Bronchitis. Informed patient that there will be a $35. No show fee.

## 2014-08-16 ENCOUNTER — Institutional Professional Consult (permissible substitution): Payer: Medicare Other | Admitting: Neurology

## 2014-08-21 ENCOUNTER — Telehealth: Payer: Self-pay | Admitting: Family Medicine

## 2014-08-21 MED ORDER — CEFUROXIME AXETIL 500 MG PO TABS: 500.0000 mg | ORAL_TABLET | Freq: Two times a day (BID) | ORAL | Status: DC

## 2014-08-21 MED ORDER — HYDROCODONE-HOMATROPINE 5-1.5 MG/5ML PO SYRP: 5.0000 mL | ORAL_SOLUTION | ORAL | Status: DC | PRN

## 2014-08-21 NOTE — Telephone Encounter (Signed)
I printed out a new antibiotic and a stronger cough med

## 2014-08-21 NOTE — Telephone Encounter (Signed)
Pt states she can not afford the crestor meds, and would like to know if there is something else she try.

## 2014-08-21 NOTE — Telephone Encounter (Signed)
Scripts are ready for pick up and I spoke with pt. 

## 2014-08-21 NOTE — Telephone Encounter (Signed)
Called and lmovm informing pt that she would have to contact her insurance formulary to see what is preferred.

## 2014-08-21 NOTE — Telephone Encounter (Signed)
Pt saw dr fry on 08-15-14 and was given abx. Pt is calling back still having cough and per pt she was told by md to callback if needed. Pt has [facility] [address] please advise

## 2014-08-23 NOTE — Telephone Encounter (Signed)
Called pt again. Closing encounter until pt returns call.  

## 2014-09-06 ENCOUNTER — Other Ambulatory Visit: Payer: Self-pay | Admitting: General Practice

## 2014-09-06 MED ORDER — SIMVASTATIN 40 MG PO TABS: 40.0000 mg | ORAL_TABLET | Freq: Every day | ORAL | Status: DC

## 2014-09-06 NOTE — Telephone Encounter (Signed)
Can switch to Simvastatin 40mg 

## 2014-09-06 NOTE — Telephone Encounter (Signed)
Caller name: Chanese  Call back number: [phone] Pharmacy: [facility]  Reason for call:  Pt spoke insurance, they advised changing to [employer]

## 2014-09-06 NOTE — Telephone Encounter (Signed)
Med filled.  

## 2014-09-13 ENCOUNTER — Ambulatory Visit (INDEPENDENT_AMBULATORY_CARE_PROVIDER_SITE_OTHER): Payer: Medicare Other | Admitting: Neurology

## 2014-09-13 ENCOUNTER — Encounter: Payer: Self-pay | Admitting: Neurology

## 2014-09-13 VITALS — BP 125/78 | HR 73 | Temp 97.2°F | Ht 70.0 in | Wt 221.0 lb

## 2014-09-13 DIAGNOSIS — R51 Headache: Secondary | ICD-10-CM

## 2014-09-13 DIAGNOSIS — G478 Other sleep disorders: Secondary | ICD-10-CM

## 2014-09-13 DIAGNOSIS — E669 Obesity, unspecified: Secondary | ICD-10-CM

## 2014-09-13 DIAGNOSIS — G4719 Other hypersomnia: Secondary | ICD-10-CM

## 2014-09-13 DIAGNOSIS — G2581 Restless legs syndrome: Secondary | ICD-10-CM

## 2014-09-13 DIAGNOSIS — G4761 Periodic limb movement disorder: Secondary | ICD-10-CM

## 2014-09-13 DIAGNOSIS — G4733 Obstructive sleep apnea (adult) (pediatric): Secondary | ICD-10-CM

## 2014-09-13 DIAGNOSIS — R519 Headache, unspecified: Secondary | ICD-10-CM

## 2014-09-13 NOTE — Patient Instructions (Signed)
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
## 2014-10-01 ENCOUNTER — Telehealth: Payer: Self-pay | Admitting: Family Medicine

## 2014-10-01 NOTE — Telephone Encounter (Signed)
Transferred to [facility] due to pt feeling weak and shaky and vomiting.

## 2014-10-01 NOTE — Telephone Encounter (Signed)
Patient Information:  Caller Name: Afsheen  Phone: [phone]  Patient: [name], [name]  Gender: Female  DOB: 25-Jan-1956  Age: 58 Years  PCP: Midge Minium.  Office Follow Up:  Does the office need to follow up with this patient?: No  Instructions For The Office: No appt available at the office; pt wants to see Dr Birdie Riddle; office called and instructed to send to UC; pt will comply and go to [facility]; encouraged another adult to drive   Symptoms  Reason For Call & Symptoms: Pt is calling and states that she feels weak; sx started 10/01/2014;  generalized weakness and nauseated yesterday with shaky feeling; today she still feels generally weak and shaky; still can get up and walk but just an uneasy feeling; no pain;  Reviewed Health History In EMR: Yes  Reviewed Medications In EMR: Yes  Reviewed Allergies In EMR: Yes  Reviewed Surgeries / Procedures: Yes  Date of Onset of Symptoms: 09/30/2014  Guideline(s) Used:  Weakness (Generalized) and Fatigue  Disposition Per Guideline:   Go to Office Now  Reason For Disposition Reached:   Moderate weakness (i.e., interferes with work, school, normal activities) and cause unknown  Advice Given:  Call Back If:  You become worse.  Patient Will Follow Care Advice:  YES

## 2014-10-02 NOTE — Telephone Encounter (Signed)
Noted  

## 2014-10-15 ENCOUNTER — Telehealth: Payer: Self-pay | Admitting: Family Medicine

## 2014-10-15 NOTE — Telephone Encounter (Signed)
Called pt and advised that we do not have samples and that she should call her formulary to find out if there is an alternative that they prefer.

## 2014-10-15 NOTE — Telephone Encounter (Signed)
Caller name:Riggin, Kameshia Relation to VI:FBPP Call back number:[phone] Pharmacy:  Reason for call: pt would like to know if you have any samples of qvar inhaler, pt states they are so expensive and she can't afforded it being on medicare. Would like to know if [name] has any suggestions on how she can get them cheaper.

## 2014-10-17 ENCOUNTER — Telehealth: Payer: Self-pay | Admitting: Family Medicine

## 2014-10-17 MED ORDER — FLUTICASONE PROPIONATE HFA 110 MCG/ACT IN AERO: 2.0000 | INHALATION_SPRAY | Freq: Two times a day (BID) | RESPIRATORY_TRACT | Status: DC

## 2014-10-17 NOTE — Telephone Encounter (Signed)
New inhaler placed and chart updated.

## 2014-10-17 NOTE — Telephone Encounter (Addendum)
Caller name: Jalea, Bronaugh Relation to pt: self  Call back number: [phone]   Reason for call:  Pt states insurance will cover flovent inhaler 110 mcg and will not cover inhaler prescribed.

## 2014-11-16 ENCOUNTER — Ambulatory Visit: Payer: Self-pay | Admitting: Internal Medicine

## 2014-11-27 ENCOUNTER — Ambulatory Visit (INDEPENDENT_AMBULATORY_CARE_PROVIDER_SITE_OTHER): Payer: Medicare Other | Admitting: Neurology

## 2014-11-27 VITALS — BP 130/85

## 2014-11-27 DIAGNOSIS — G4733 Obstructive sleep apnea (adult) (pediatric): Secondary | ICD-10-CM

## 2014-11-27 DIAGNOSIS — G479 Sleep disorder, unspecified: Secondary | ICD-10-CM

## 2014-11-27 DIAGNOSIS — G4734 Idiopathic sleep related nonobstructive alveolar hypoventilation: Secondary | ICD-10-CM

## 2014-11-27 DIAGNOSIS — G4720 Circadian rhythm sleep disorder, unspecified type: Secondary | ICD-10-CM

## 2014-11-27 NOTE — Sleep Study (Signed)
Please see the scanned sleep study interpretation located in the Procedure tab within the Chart Review section. 

## 2014-11-30 ENCOUNTER — Encounter (HOSPITAL_COMMUNITY): Payer: Self-pay | Admitting: *Deleted

## 2014-11-30 ENCOUNTER — Emergency Department (HOSPITAL_COMMUNITY)
Admission: EM | Admit: 2014-11-30 | Discharge: 2014-11-30 | Disposition: A | Discharge status: Home / Self Care | Payer: Medicare Other | Attending: Emergency Medicine | Admitting: Emergency Medicine

## 2014-11-30 ENCOUNTER — Emergency Department (HOSPITAL_COMMUNITY): Payer: Medicare Other

## 2014-11-30 DIAGNOSIS — S6991XA Unspecified injury of right wrist, hand and finger(s), initial encounter: Secondary | ICD-10-CM | POA: Diagnosis not present

## 2014-11-30 DIAGNOSIS — Z87440 Personal history of urinary (tract) infections: Secondary | ICD-10-CM | POA: Insufficient documentation

## 2014-11-30 DIAGNOSIS — Z8669 Personal history of other diseases of the nervous system and sense organs: Secondary | ICD-10-CM | POA: Diagnosis not present

## 2014-11-30 DIAGNOSIS — Y9301 Activity, walking, marching and hiking: Secondary | ICD-10-CM | POA: Insufficient documentation

## 2014-11-30 DIAGNOSIS — R011 Cardiac murmur, unspecified: Secondary | ICD-10-CM | POA: Insufficient documentation

## 2014-11-30 DIAGNOSIS — F419 Anxiety disorder, unspecified: Secondary | ICD-10-CM | POA: Diagnosis not present

## 2014-11-30 DIAGNOSIS — Z79899 Other long term (current) drug therapy: Secondary | ICD-10-CM | POA: Insufficient documentation

## 2014-11-30 DIAGNOSIS — Z8739 Personal history of other diseases of the musculoskeletal system and connective tissue: Secondary | ICD-10-CM | POA: Diagnosis not present

## 2014-11-30 DIAGNOSIS — K219 Gastro-esophageal reflux disease without esophagitis: Secondary | ICD-10-CM | POA: Insufficient documentation

## 2014-11-30 DIAGNOSIS — Y9289 Other specified places as the place of occurrence of the external cause: Secondary | ICD-10-CM | POA: Diagnosis not present

## 2014-11-30 DIAGNOSIS — Y998 Other external cause status: Secondary | ICD-10-CM | POA: Insufficient documentation

## 2014-11-30 DIAGNOSIS — J45909 Unspecified asthma, uncomplicated: Secondary | ICD-10-CM | POA: Diagnosis not present

## 2014-11-30 DIAGNOSIS — F319 Bipolar disorder, unspecified: Secondary | ICD-10-CM | POA: Insufficient documentation

## 2014-11-30 DIAGNOSIS — Y9389 Activity, other specified: Secondary | ICD-10-CM | POA: Insufficient documentation

## 2014-11-30 DIAGNOSIS — S59911A Unspecified injury of right forearm, initial encounter: Secondary | ICD-10-CM | POA: Diagnosis present

## 2014-11-30 DIAGNOSIS — W19XXXA Unspecified fall, initial encounter: Secondary | ICD-10-CM

## 2014-11-30 DIAGNOSIS — W01198A Fall on same level from slipping, tripping and stumbling with subsequent striking against other object, initial encounter: Secondary | ICD-10-CM | POA: Diagnosis not present

## 2014-11-30 DIAGNOSIS — Z7951 Long term (current) use of inhaled steroids: Secondary | ICD-10-CM | POA: Diagnosis not present

## 2014-11-30 DIAGNOSIS — S52121A Displaced fracture of head of right radius, initial encounter for closed fracture: Secondary | ICD-10-CM | POA: Diagnosis not present

## 2014-11-30 DIAGNOSIS — E785 Hyperlipidemia, unspecified: Secondary | ICD-10-CM | POA: Insufficient documentation

## 2014-11-30 DIAGNOSIS — Z880 Allergy status to penicillin: Secondary | ICD-10-CM | POA: Insufficient documentation

## 2014-11-30 MED ORDER — HYDROCODONE-ACETAMINOPHEN 5-325 MG PO TABS: ORAL_TABLET | ORAL | Status: DC

## 2014-11-30 MED ORDER — IBUPROFEN 600 MG PO TABS: 600.0000 mg | ORAL_TABLET | Freq: Four times a day (QID) | ORAL | Status: DC | PRN

## 2014-11-30 NOTE — ED Notes (Signed)
Bed: WA07 Expected date:  Expected time:  Means of arrival:  Comments: EMS- 59yo F, fall, arm pain

## 2014-11-30 NOTE — ED Provider Notes (Signed)
CSN: [id number]     Arrival date & time 11/30/14  1310 History   First MD Initiated Contact with Patient 11/30/14 1319     Chief Complaint  Patient presents with  . Arm Injury     (Consider location/radiation/quality/duration/timing/severity/associated sxs/prior Treatment) HPI Comments: Right-handed female presents with complaint of right wrist, elbow, and shoulder pain which started acutely last night after a mechanical fall onto an outstretched right arm. Patient tripped over an object that was in the ground. She treated the pain at home with ibuprofen but pain worsened overnight. She denies head or neck injury. She has no other complaints at this time. No numbness, weakness, or tingling. Pain is worse with movement of her hand and arm and shoulder. Nothing makes symptoms better.  The history is provided by the patient.    Past Medical History  Diagnosis Date  . Bipolar disorder   . MVP (mitral valve prolapse)   . Hyperlipidemia   . Asthma   . Allergic rhinitis   . Arthritis     R knee, Left Knee  . Urinary tract infection     frequent, see Urololgist 12/16/2010  . Dysrhythmia     Palpations occ, takes Tenormin  . Constipation   . Complication of anesthesia     had bad anxiety after anesthesia  . Heart murmur   . Anxiety   . Constipation, chronic   . GERD (gastroesophageal reflux disease)   . Depression   . Cataract    Past Surgical History  Procedure Laterality Date  . Nasal sinus surgery  01/2010    x2  . Back surgery  2001    L5 - S1  . Abdominal hysterectomy  2002    no oophorectomy  . Tonsillectomy    . Total knee arthroplasty  12/23/2011    Procedure: TOTAL KNEE ARTHROPLASTY;  Surgeon: Ninetta Lights, MD;  Location: [place];  Service: Orthopedics;  Laterality: Right;  DR MURPHY WANTS 120 MINUTES FOR SURGERY  . Total knee arthroplasty  08/31/2012    left knee  . Total knee arthroplasty  08/31/2012    Procedure: TOTAL KNEE ARTHROPLASTY;  Surgeon: Ninetta Lights,  MD;  Location: [place];  Service: Orthopedics;  Laterality: Left;   Family History  Problem Relation Age of Onset  . Coronary artery disease Father   . Heart attack Father 70  . Heart disease Father   . Hypertension Brother   . Anesthesia problems Neg Hx   . Lung disease Mother     Mycobacterium avium intracellulare   History  Substance Use Topics  . Smoking status: Never Smoker   . Smokeless tobacco: Never Used  . Alcohol Use: No   OB History    No data available     Review of Systems  Constitutional: Negative for activity change.  Musculoskeletal: Positive for arthralgias. Negative for back pain, joint swelling and neck pain.  Skin: Negative for wound.  Neurological: Negative for weakness and numbness.    Allergies  Nitrofurantoin monohyd macro; Sulfa antibiotics; Alprazolam; Amoxicillin; and Prednisone  Home Medications   Prior to Admission medications   Medication Sig Start Date End Date Taking? Authorizing Provider  albuterol (PROAIR HFA) 108 (90 BASE) MCG/ACT inhaler Inhale 2 puffs into the lungs every 4 (four) hours as needed for wheezing. 11/21/12  Yes Midge Minium, MD  atenolol (TENORMIN) 50 MG tablet TAKE 1 TABLET BY MOUTH ONCE DAILY 07/19/14  Yes Midge Minium, MD  beclomethasone (QVAR) 80 MCG/ACT  inhaler Inhale 2 puffs into the lungs 2 (two) times daily.   Yes Historical Provider, MD  clonazePAM (KLONOPIN) 1 MG tablet Take 1 mg by mouth 4 (four) times daily as needed for anxiety.    Yes Historical Provider, MD  estrogens, conjugated, (PREMARIN) 0.625 MG tablet Take 0.625 mg by mouth daily.    Yes Historical Provider, MD  fenofibrate 160 MG tablet TAKE 1 TABLET BY MOUTH EVERY DAY 07/17/14  Yes Midge Minium, MD  gabapentin (NEURONTIN) 600 MG tablet Take 600 mg by mouth 3 (three) times daily.   Yes Historical Provider, MD  lamoTRIgine (LAMICTAL) 200 MG tablet Take 200 mg by mouth 2 (two) times daily.    Yes Historical Provider, MD  montelukast  (SINGULAIR) 10 MG tablet Take 1 tablet (10 mg total) by mouth at bedtime. 03/09/14  Yes Midge Minium, MD  omeprazole (PRILOSEC) 40 MG capsule TAKE 1 CAPSULE BY MOUTH ONCE DAILY 09/29/13  Yes Midge Minium, MD  SEROQUEL XR 150 MG 24 hr tablet Take 150 mg by mouth at bedtime.  05/21/13  Yes Historical Provider, MD  simvastatin (ZOCOR) 40 MG tablet Take 1 tablet (40 mg total) by mouth at bedtime. 09/06/14  Yes Midge Minium, MD  venlafaxine (EFFEXOR-XR) 75 MG 24 hr capsule Take 225 mg by mouth daily.    Yes Historical Provider, MD  fluticasone (FLOVENT HFA) 110 MCG/ACT inhaler Inhale 2 puffs into the lungs 2 (two) times daily. Patient not taking: Reported on 11/30/2014 10/17/14   Midge Minium, MD  QVAR 80 MCG/ACT inhaler INHALE 2 PUFFS INTO THE LUNGS 2 (TWO) TIMES DAILY. Patient not taking: Reported on 11/30/2014    Midge Minium, MD  traMADol (ULTRAM) 50 MG tablet Take 1 tablet (50 mg total) by mouth every 6 (six) hours as needed (cough). Patient not taking: Reported on 11/30/2014 06/22/14   Midge Minium, MD   BP 135/72 mmHg  Pulse 83  Temp(Src) 99.3 F (37.4 C) (Oral)  Resp 18  SpO2 96%  LMP 11/09/2000   Physical Exam  Constitutional: She appears well-developed and well-nourished.  HENT:  Head: Normocephalic and atraumatic.  Eyes: Pupils are equal, round, and reactive to light.  Neck: Normal range of motion. Neck supple.  Cardiovascular: Exam reveals no decreased pulses.   Pulses:      Radial pulses are 2+ on the right side.  Musculoskeletal: She exhibits tenderness. She exhibits no edema.       Right shoulder: She exhibits tenderness and pain. She exhibits normal range of motion, no bony tenderness, no swelling, no deformity and no laceration.       Right elbow: She exhibits decreased range of motion. She exhibits no swelling and no effusion. Tenderness found. Radial head tenderness noted.       Right wrist: She exhibits tenderness. She exhibits normal range  of motion and no bony tenderness.       Cervical back: She exhibits normal range of motion, no tenderness and no bony tenderness.       Right upper arm: She exhibits tenderness. She exhibits no bony tenderness and no swelling.       Right forearm: She exhibits tenderness. She exhibits no bony tenderness and no swelling.       Arms:      Right hand: Normal. Normal sensation noted. Normal strength noted.  Neurological: She is alert. No sensory deficit.  Motor, sensation, and vascular distal to the injury is fully intact.   Skin:  Skin is warm and dry.  Psychiatric: She has a normal mood and affect.  Nursing note and vitals reviewed.   ED Course  Procedures (including critical care time) Labs Review Labs Reviewed - No data to display  Imaging Review Dg Shoulder Right  11/30/2014   CLINICAL DATA:  Tripped and fell with right shoulder pain, initial encounter  EXAM: RIGHT SHOULDER - 2+ VIEW  COMPARISON:  None.  FINDINGS: No acute fracture or dislocation is noted. No gross soft tissue abnormality is seen. The underlying bony thorax is within normal limits. Minimal right midlung atelectatic changes are seen.  IMPRESSION: No acute abnormality noted.   Electronically Signed   By: Inez Catalina M.D.   On: 11/30/2014 14:18   Dg Elbow Complete Right  11/30/2014   CLINICAL DATA:  The patient was walking across the front yard and fell last night with right elbow pain  EXAM: RIGHT ELBOW - COMPLETE 3+ VIEW  COMPARISON:  None.  FINDINGS: There is minimal displaced fracture of the radial head. There is fat fluid level in the anterior elbow joint.  IMPRESSION: Minimal displaced fracture of the radial head.   Electronically Signed   By: Abelardo Diesel M.D.   On: 11/30/2014 14:20   Dg Wrist Complete Right  11/30/2014   CLINICAL DATA:  Tripped and fall with wrist pain, initial encounter  EXAM: RIGHT WRIST - COMPLETE 3+ VIEW  COMPARISON:  None.  FINDINGS: There is no evidence of fracture or dislocation. There is no  evidence of arthropathy or other focal bone abnormality. Soft tissues are unremarkable.  IMPRESSION: No acute abnormality noted.   Electronically Signed   By: Inez Catalina M.D.   On: 11/30/2014 14:17     EKG Interpretation None       1:51 PM Patient seen and examined. Work-up initiated.    Vital signs reviewed and are as follows: BP 135/72 mmHg  Pulse 83  Temp(Src) 99.3 F (37.4 C) (Oral)  Resp 18  SpO2 96%  LMP 11/09/2000  2:40 PM X-ray shows a minimally displaced radial head fracture. Discussed findings with Dr. Eulis Foster. Patient provided with sling. She has seen Dr. Percell Miller in past -- he is also on-call today.   Patient counseled on use of narcotic pain medications. Counseled not to combine these medications with others containing tylenol. Urged not to drink alcohol, drive, or perform any other activities that requires focus while taking these medications. The patient verbalizes understanding and agrees with the plan.   MDM   Final diagnoses:  Fall  Radial head fracture, right, closed, initial encounter   Radial head fracture: Not significantly displaced. Circulation, motor, sensation intact distally. Treatment and follow-up as above.   No dangerous or life-threatening conditions suspected or identified by history, physical exam, and by work-up. No indications for hospitalization identified.      Carlisle Cater, PA-C 11/30/14 [place], MD 11/30/14 [phone]

## 2014-11-30 NOTE — ED Notes (Signed)
[facility] called to transport home 

## 2014-11-30 NOTE — Discharge Instructions (Signed)
Please read and follow all provided instructions.  Your diagnoses today include:  1. Radial head fracture, right, closed, initial encounter   2. Fall    Tests performed today include:  An x-ray of the affected area - shows radial head fracture, normal wrist and shoulder  Vital signs. See below for your results today.   Medications prescribed:   Vicodin (hydrocodone/acetaminophen) - narcotic pain medication  DO NOT drive or perform any activities that require you to be awake and alert because this medicine can make you drowsy. BE VERY CAREFUL not to take multiple medicines containing Tylenol (also called acetaminophen). Doing so can lead to an overdose which can damage your liver and cause liver failure and possibly death.   Ibuprofen (Motrin, Advil) - anti-inflammatory pain medication  Do not exceed 600mg  ibuprofen every 6 hours, take with food  You have been prescribed an anti-inflammatory medication or NSAID. Take with food. Take smallest effective dose for the shortest duration needed for your pain. Stop taking if you experience stomach pain or vomiting.   Take any prescribed medications only as directed.  Home care instructions:   Follow any educational materials contained in this packet  Follow R.I.C.E. Protocol:  R - rest your injury   I  - use ice on injury without applying directly to skin  C - compress injury with bandage or splint  E - elevate the injury as much as possible  Follow-up instructions: Please follow-up with your primary care provider or the provided orthopedic physician (bone specialist) if you continue to have significant pain in 1 week. In this case you may have a more severe injury that requires further care.   Return instructions:   Please return if your fingers are numb or tingling, appear gray or blue, or you have severe pain (also elevate the arm and loosen splint or wrap if you were given one)   Please return to the Emergency Department  if you experience worsening symptoms.   Please return if you have any other emergent concerns.  Additional Information:  Your vital signs today were: BP 135/72 mmHg   Pulse 83   Temp(Src) 99.3 F (37.4 C) (Oral)   Resp 18   SpO2 96%   LMP 11/09/2000 If your blood pressure (BP) was elevated above 135/85 this visit, please have this repeated by your doctor within one month. --------------

## 2014-11-30 NOTE — ED Notes (Signed)
Pt was walking across front yard, tripped over dog chain and fell. Caught self on outstretched right hand. Now with 9/10 right wrist, forearm, elbow pain.

## 2014-12-06 ENCOUNTER — Telehealth: Payer: Self-pay | Admitting: Neurology

## 2014-12-06 DIAGNOSIS — G4733 Obstructive sleep apnea (adult) (pediatric): Secondary | ICD-10-CM

## 2014-12-06 DIAGNOSIS — G4734 Idiopathic sleep related nonobstructive alveolar hypoventilation: Secondary | ICD-10-CM

## 2014-12-06 NOTE — Telephone Encounter (Signed)
Please call and notify the patient that the recent sleep study did confirm the diagnosis of obstructive sleep apnea and that I recommend treatment for this in the form of CPAP.  She has overall mild obstructive sleep apnea, but low baseline oxygen saturation in sleep and significant desaturations even in the absence of obstructive respiratory events. Her hypoxemia may be due to a combination of things, including sleep disordered breathing, obesity hypoventilation, multiple sedating medications and underlying chronic lung disease. She has seen Dr. Gwenette Greet in pulmonology in the recent past (02/22/14), but has not been labeled with a definitive underlying lung disease, but has had a chronic cough of over 20 years' duration. I would like to suggest that the patient be treated for her mild overall OSA with CPAP due to her degree of desaturations and her sleep related complaints. However, I would also like for her to see her lung doctor again. We can certainly wait for this till after the cpap study. Please also note, that her AHI and desaturation nadir may be underestimated by this study due to the absence of REM sleep. I have placed an order for cpap titration in the chart. Thanks, Star Age, MD, PhD [facility])

## 2014-12-07 ENCOUNTER — Encounter: Payer: Self-pay | Admitting: Neurology

## 2014-12-10 NOTE — Telephone Encounter (Signed)
Patient returned my phone calls and was provided the results of her sleep study that revealed sleep apnea and hypoxemia.  Patient was informed a CPAP titration study was recommended by Dr. Rexene Alberts for treatment.  The patient was in agreement and scheduled her sleep study for March 1st at 08:00 pm.  Dr. Annye Asa was sent a copy of the test results.

## 2015-01-08 ENCOUNTER — Ambulatory Visit (INDEPENDENT_AMBULATORY_CARE_PROVIDER_SITE_OTHER): Payer: Medicare Other | Admitting: Neurology

## 2015-01-08 DIAGNOSIS — G4739 Other sleep apnea: Secondary | ICD-10-CM

## 2015-01-08 DIAGNOSIS — G4734 Idiopathic sleep related nonobstructive alveolar hypoventilation: Secondary | ICD-10-CM

## 2015-01-08 DIAGNOSIS — G4733 Obstructive sleep apnea (adult) (pediatric): Secondary | ICD-10-CM

## 2015-01-08 DIAGNOSIS — G4731 Primary central sleep apnea: Secondary | ICD-10-CM

## 2015-01-08 NOTE — Sleep Study (Signed)
Please see the scanned sleep study interpretation located in the Procedure tab within the Chart Review section. 

## 2015-01-18 ENCOUNTER — Telehealth: Payer: Self-pay | Admitting: Neurology

## 2015-01-18 DIAGNOSIS — G4733 Obstructive sleep apnea (adult) (pediatric): Secondary | ICD-10-CM

## 2015-01-18 NOTE — Telephone Encounter (Signed)
Please call and inform patient that I have entered an order for treatment with PAP. She did well during the latest sleep study with CPAP. We will, therefore, arrange for a machine for home use through a DME (durable medical equipment) company of Her choice; and I will see the patient back in follow-up in about 6 weeks. Please also explain to the patient that I will be looking out for compliance data downloaded from the machine, which can be done remotely through a modem at times or stored on an SD card in the back of the machine. At the time of the followup appointment we will discuss sleep study results and how it is going with PAP treatment at home. Please advise patient to bring Her machine at the time of the visit; at least for the first visit, even though this is cumbersome. Bringing the machine for every visit after that may not be needed, but often helps for the first visit. Please also make sure, the patient has a follow-up appointment with me in about 6 weeks from the setup date, thanks.   Quinesha Selinger, MD, PhD [facility] ([facility])  

## 2015-01-21 ENCOUNTER — Encounter: Payer: Self-pay | Admitting: *Deleted

## 2015-01-21 ENCOUNTER — Encounter: Payer: Self-pay | Admitting: Neurology

## 2015-01-21 NOTE — Telephone Encounter (Signed)
Patient was contacted and provided the results of her CPAP titration sleep study that was considered effective in treatment.  Patient was referred to [facility] for CPAP set up.  [name] was routed a copy of the report.   Patient instructed to contact our office 6-8 weeks post set up to schedule a follow up appointment.  The patient gave verbal permission to mail a copy of her test results.

## 2015-03-05 ENCOUNTER — Telehealth: Payer: Self-pay

## 2015-03-05 NOTE — Telephone Encounter (Signed)
Patient called stating that she was having tingling in her left. Spoke with patient who states that it started about 3 days ago after she helped her neighbor put out mulch.  Patient is not experiencing any additional symptoms. Advised patient that if her symptoms changed to go directly to the ED. Scheduled to see PCP 03/05/14 at 830am.

## 2015-03-06 ENCOUNTER — Encounter: Payer: Self-pay | Admitting: Family Medicine

## 2015-03-06 ENCOUNTER — Other Ambulatory Visit (HOSPITAL_COMMUNITY)
Admission: RE | Admit: 2015-03-06 | Discharge: 2015-03-06 | Disposition: A | Discharge status: Home / Self Care | Payer: Medicare Other | Source: Ambulatory Visit | Attending: Family Medicine | Admitting: Family Medicine

## 2015-03-06 ENCOUNTER — Ambulatory Visit (INDEPENDENT_AMBULATORY_CARE_PROVIDER_SITE_OTHER): Payer: Medicare Other | Admitting: Family Medicine

## 2015-03-06 VITALS — BP 128/78 | HR 66 | Temp 98.1°F | Resp 16 | Wt 227.1 lb

## 2015-03-06 DIAGNOSIS — R202 Paresthesia of skin: Secondary | ICD-10-CM | POA: Diagnosis not present

## 2015-03-06 DIAGNOSIS — N9489 Other specified conditions associated with female genital organs and menstrual cycle: Secondary | ICD-10-CM | POA: Diagnosis not present

## 2015-03-06 DIAGNOSIS — N898 Other specified noninflammatory disorders of vagina: Secondary | ICD-10-CM

## 2015-03-06 DIAGNOSIS — N760 Acute vaginitis: Secondary | ICD-10-CM | POA: Insufficient documentation

## 2015-03-06 NOTE — Patient Instructions (Addendum)
Schedule your complete physical at your convenience We'll notify you of your lab results and make any changes if needed The tingling in your L arm is from overuse while doing the yard work this weekend- this should improve w/ time Heating pad for pain/inflammation Tylenol or ibuprofen as needed Call with any questions or concerns Happy Spring!

## 2015-03-06 NOTE — Assessment & Plan Note (Signed)
Pt has hx of similar.  Wet prep collected.  Will hold off on tx until results available.  Will follow.

## 2015-03-06 NOTE — Progress Notes (Signed)
   Subjective:    Patient ID: [name], female    DOB: Oct 13, 1956, 59 y.o.   MRN: [id number]  HPI L arm tingling- pt reports intermittent sxs.  Will occasionally wake w/ numb fingers.  Did a lot of yard work over the weekend and sxs started at the same time.  No weakness of arm or grip.  No swelling.  No bruising.  No neck pain.  Vaginal odor- recurrent problem for pt.  Hx of BV.  Denies d/c.  'the more you sweat, the worse it gets'.   Review of Systems For ROS see HPI     Objective:   Physical Exam  Constitutional: She is oriented to person, place, and time. She appears well-developed and well-nourished. No distress.  HENT:  Head: Normocephalic and atraumatic.  Neck: Normal range of motion. Neck supple.  Cardiovascular: Intact distal pulses.   Genitourinary: Vagina normal. No vaginal discharge found.  Musculoskeletal: She exhibits no edema or tenderness.  Full ROM of L shoulder Full ROM of L elbow  Lymphadenopathy:    She has no cervical adenopathy.  Neurological: She is alert and oriented to person, place, and time. She has normal reflexes. No cranial nerve deficit. Coordination normal.  Skin: Skin is warm and dry. No rash noted. No erythema.  Psychiatric: She has a normal mood and affect. Her behavior is normal. Thought content normal.  Vitals reviewed.         Assessment & Plan:

## 2015-03-06 NOTE — Progress Notes (Signed)
Pre visit review using our clinic review tool, if applicable. No additional management support is needed unless otherwise documented below in the visit note. 

## 2015-03-06 NOTE — Assessment & Plan Note (Signed)
Pt's L arm tingling is consistent w/ overuse and repetitive motion while gardening.  Pt to take tylenol or ibuprofen as needed for pain/inflammation.  Heat for pain relief.  No red flags on hx or PE.  Will continue to follow.

## 2015-03-07 LAB — CERVICOVAGINAL ANCILLARY ONLY: Wet Prep (BD Affirm): NEGATIVE

## 2015-03-12 ENCOUNTER — Telehealth: Payer: Self-pay | Admitting: Family Medicine

## 2015-03-12 NOTE — Telephone Encounter (Signed)
Caller name: Taina, Landry Relation to pt: self   Call back number: [phone]   Reason for call:  Pt checking the status of bacterial infection, please leave detail message on mobile

## 2015-03-12 NOTE — Telephone Encounter (Signed)
Negative for BV- this was one I tried to release when Epic was having issues over the weekend

## 2015-03-12 NOTE — Telephone Encounter (Signed)
Pt advised of the results and also told that provider stated that she can use rephresh clean and balance OTC.

## 2015-03-12 NOTE — Telephone Encounter (Signed)
I see where the results were sent to MyChart however I do not see any documentation from you. Please advise

## 2015-03-15 ENCOUNTER — Ambulatory Visit: Payer: Medicare Other | Admitting: Neurology

## 2015-03-15 ENCOUNTER — Telehealth: Payer: Self-pay

## 2015-03-15 NOTE — Telephone Encounter (Signed)
I spoke with patient and she is aware that Dr. Rexene Alberts is out sick. Patient is reascheduled for 5/13.

## 2015-03-22 ENCOUNTER — Ambulatory Visit: Payer: Self-pay | Admitting: Neurology

## 2015-03-27 ENCOUNTER — Encounter: Payer: Self-pay | Admitting: Neurology

## 2015-03-27 ENCOUNTER — Ambulatory Visit (INDEPENDENT_AMBULATORY_CARE_PROVIDER_SITE_OTHER): Payer: Medicare Other | Admitting: Neurology

## 2015-03-27 VITALS — BP 118/76 | HR 70 | Resp 18 | Ht 69.0 in | Wt 230.0 lb

## 2015-03-27 DIAGNOSIS — E669 Obesity, unspecified: Secondary | ICD-10-CM | POA: Diagnosis not present

## 2015-03-27 DIAGNOSIS — Z9989 Dependence on other enabling machines and devices: Principal | ICD-10-CM

## 2015-03-27 DIAGNOSIS — G4733 Obstructive sleep apnea (adult) (pediatric): Secondary | ICD-10-CM | POA: Diagnosis not present

## 2015-03-27 NOTE — Patient Instructions (Signed)

## 2015-03-27 NOTE — Progress Notes (Signed)
Subjective:    Patient ID: [name] is a 59 y.o. female.  HPI     Interim history:   [name] is a 59 year old right-handed woman with an underlying medical history of bipolar disorder, obesity, mitral valve prolapse, hyperlipidemia, allergic rhinitis, palpitations, constipation, heart murmur, chronic cough, reflux disease, anxiety, and low back pain status post back surgery in 2001, arthritis, status post left total knee arthroplasty and right total knee arthroplasty, tonsillectomy, and hysterectomy, and status post nasal surgeries twice, who presents for follow-up consultation of her obstructive sleep apnea. The patient is unaccompanied today. I first met her on 09/13/2014 at the request of her primary care physician. At which time the patient reported excessive daytime somnolence and snoring. I invited her back for sleep study. She had a baseline sleep study, followed by a CPAP titration study and I went over her test results with her in detail today. Her baseline sleep study from 11/27/2014 showed a sleep efficiency of 77.7%. Sleep latency was prolonged. Wake after sleep onset was 39.5 minutes with mild to moderate sleep fragmentation. She had an increased percentage of stage I and stage II sleep, absence of slow-wave sleep and absence of REM sleep. She had moderate to loud snoring. Total AHI was 5.8 per hour, rising to 15.8 per hour in the supine position. Average oxygen saturation was only 90%. Nadir was 77%. Based on her significant oxygen desaturations I invited her back for CPAP titration study. Of note, the absence of REM sleep with most likely have underestimated her obstructive sleep apnea. Her CPAP titration study from 01/08/2015 showed a sleep efficiency of 93.6%, latency to sleep was 0 minutes and wake after sleep onset was 33.5 minutes with mild sleep fragmentation noted. She had a normal arousal index. She had a absence of slow-wave sleep and a normal percentage of REM sleep with a  long REM latency. She had no significant PLMS. Average oxygen saturation was 92%, nadir was 84%. She did have evidence of central apneas upon starting with CPAP. CPAP was titrated from 5-11 cm. I prescribed CPAP therapy for home use. I did suggest that she should have a pulse oximetry test while on CPAP once established on treatment.  Today, 03/27/2015: I reviewed her CPAP compliance data from 02/23/2015 through 03/24/2015 which is a total of 30 days during which time she used her machine 25 days with percent used days greater than 4 hours at 77%, indicating good compliance with an average usage of 6 hours and 11 minutes. Residual AHI acceptable at 3.3 per hour and 95th percentile of leak at 12 L/m on a pressure of 11 cm with EPR of 3.  Today, 03/27/2015: She reports that she is feeling better, she is now dreaming and has no "crazy dreams". She feels better rested. Unfortunately, she has had more stress. She worries about her brother. She is a stress eater and has not yet been able to lose weight but she is going to try harder. She has found a book that talks about weight loss and healthy food choices. She is going to exercise more. Overall, she feels that she is doing better with treatment of her sleep apnea and her morning headaches are also improved. She rarely has to get up to use the bathroom. She is accustomed to using a nasal pillows.  Previously:   I reviewed her CPAP compliance data from 02/12/2015 through 03/13/2015 which is a total of 30 days during which time she used her machine only 22  days with percent used days greater than 4 hours at 73%, indicating adequate compliance with an average usage of 5 hours and 41 minutes. Residual AHI acceptable at 3.4 per hour and 95th percentile of leak at 14.7 L/m on a pressure of 11 cm with EPR of 3.  She goes to [facility] for her mood d/o. She is on multiple sedating medications, but has been told she snores. She has snored for 4 years  or so. She does not wake up rested. She wakes up with a headache. She denies nocturia. She has no FHx of OSA. She has had intermittent RLS symptoms and twitches her legs in her sleep. She has dozed off driving more than once and veered out of the lane, but thankfully has had no MVA.   She drinks 2 cups of coffee per day. She does not smoke and very rarely drinks alcohol.   She has 2 cats, who occasionally are in the bed, but don't bother her. She does not watch TV in bed. She mostly sleeps alone. She is divorced and has no children.   Her typical bedtime is reported to be around 10 PM and usual wake time is around 7 AM. Sleep onset typically occurs within minutes. She reports feeling poorly rested upon awakening. She wakes up on an average 3 times in the middle of the night and has to go to the bathroom 0 to 1 times on a typical night.    She reports excessive daytime somnolence (EDS) and Her Epworth Sleepiness Score (ESS) is 9/24 today.   The patient has not been taking a planned nap.   Snoring is reportedly marked, and associated with choking sounds and witnessed apneas. The patient denies a sense of choking or strangling feeling. The patient has noted any RLS symptoms and is known to kick while asleep or before falling asleep. There is no family history of RLS or OSA.   She is a restless sleeper and in the morning, the bed is quite disheveled.    She denies cataplexy, sleep paralysis, hypnagogic or hypnopompic hallucinations, or sleep attacks. She does not report any vivid dreams, nightmares, dream enactments, or parasomnias, such as sleep talking or sleep walking. The patient has not had a sleep study or a home sleep test. Her bedroom is usually dark and cool. She uses a box fan.    Her Past Medical History Is Significant For: Past Medical History  Diagnosis Date  . Bipolar disorder   . MVP (mitral valve prolapse)   . Hyperlipidemia   . Asthma   . Allergic rhinitis   . Arthritis     R knee,  Left Knee  . Urinary tract infection     frequent, see Urololgist 12/16/2010  . Dysrhythmia     Palpations occ, takes Tenormin  . Constipation   . Complication of anesthesia     had bad anxiety after anesthesia  . Heart murmur   . Anxiety   . Constipation, chronic   . GERD (gastroesophageal reflux disease)   . Depression   . Cataract     Her Past Surgical History Is Significant For: Past Surgical History  Procedure Laterality Date  . Nasal sinus surgery  01/2010    x2  . Back surgery  2001    L5 - S1  . Abdominal hysterectomy  2002    no oophorectomy  . Tonsillectomy    . Total knee arthroplasty  12/23/2011    Procedure: TOTAL KNEE ARTHROPLASTY;  Surgeon: Ninetta Lights, MD;  Location: [facility];  Service: Orthopedics;  Laterality: Right;  DR MURPHY WANTS 120 MINUTES FOR SURGERY  . Total knee arthroplasty  08/31/2012    left knee  . Total knee arthroplasty  08/31/2012    Procedure: TOTAL KNEE ARTHROPLASTY;  Surgeon: Ninetta Lights, MD;  Location: [facility];  Service: Orthopedics;  Laterality: Left;    Her Family History Is Significant For: Family History  Problem Relation Age of Onset  . Coronary artery disease Father   . Heart attack Father 69  . Heart disease Father   . Hypertension Brother   . Anesthesia problems Neg Hx   . Lung disease Mother     Mycobacterium avium intracellulare    Her Social History Is Significant For: History   Social History  . Marital Status: Single    Spouse Name: N/A  . Number of Children: 0  . Years of Education: 13   Occupational History  . retired//disability    Social History Main Topics  . Smoking status: Never Smoker   . Smokeless tobacco: Never Used  . Alcohol Use: No  . Drug Use: No  . Sexual Activity: No   Other Topics Concern  . None   Social History Narrative   Lives alone with 2 dogs and cats. Has pet sitting business.   2 cups of coffee a day, Rare soda     Her Allergies Are:  Allergies  Allergen Reactions  .  Nitrofurantoin Monohyd Macro Nausea Only    Severe headache  . Sulfa Antibiotics Nausea And Vomiting  . Alprazolam Anxiety and Other (See Comments)    Hyper   . Amoxicillin Rash  . Prednisone Anxiety  :   Her Current Medications Are:  Outpatient Encounter Prescriptions as of 03/27/2015  Medication Sig  . albuterol (PROAIR HFA) 108 (90 BASE) MCG/ACT inhaler Inhale 2 puffs into the lungs every 4 (four) hours as needed for wheezing.  Marland Kitchen atenolol (TENORMIN) 50 MG tablet TAKE 1 TABLET BY MOUTH ONCE DAILY  . clonazePAM (KLONOPIN) 1 MG tablet Take 1 mg by mouth 4 (four) times daily as needed for anxiety.   Marland Kitchen estrogens, conjugated, (PREMARIN) 0.625 MG tablet Take 0.625 mg by mouth daily.   . fenofibrate 160 MG tablet TAKE 1 TABLET BY MOUTH EVERY DAY  . FLOVENT HFA 110 MCG/ACT inhaler   . gabapentin (NEURONTIN) 600 MG tablet Take 600 mg by mouth 3 (three) times daily.  Marland Kitchen ibuprofen (ADVIL,MOTRIN) 600 MG tablet Take 1 tablet (600 mg total) by mouth every 6 (six) hours as needed.  . lamoTRIgine (LAMICTAL) 200 MG tablet Take 200 mg by mouth 2 (two) times daily.   . meloxicam (MOBIC) 15 MG tablet   . montelukast (SINGULAIR) 10 MG tablet Take 1 tablet (10 mg total) by mouth at bedtime.  Marland Kitchen omeprazole (PRILOSEC) 40 MG capsule TAKE 1 CAPSULE BY MOUTH ONCE DAILY  . QUEtiapine (SEROQUEL) 25 MG tablet Take 25 mg by mouth 3 (three) times daily.  . QUEtiapine (SEROQUEL) 300 MG tablet Take 300 mg by mouth at bedtime.  . simvastatin (ZOCOR) 40 MG tablet Take 1 tablet (40 mg total) by mouth at bedtime.  Marland Kitchen venlafaxine (EFFEXOR-XR) 75 MG 24 hr capsule Take 225 mg by mouth daily.   . [DISCONTINUED] HYDROcodone-acetaminophen (NORCO/VICODIN) 5-325 MG per tablet Take 1-2 tablets every 6 hours as needed for severe pain   No facility-administered encounter medications on file as of 03/27/2015.  :  Review of Systems:  Out of a complete 14 point review of systems, all are reviewed and negative with the exception of these  symptoms as listed below:   Review of Systems  All other systems reviewed and are negative.   Objective:  Neurologic Exam  Physical Exam Physical Examination:   Filed Vitals:   03/27/15 0926  BP: 118/76  Pulse: 70  Resp: 18    General Examination: The patient is a very pleasant 59 yo female in no acute distress. She appears well-developed and well-nourished and well groomed.   HEENT: Normocephalic, atraumatic, pupils are equal, round and reactive to light and accommodation. Funduscopic exam is normal with sharp disc margins noted. Extraocular tracking is good without limitation to gaze excursion or nystagmus noted. Normal smooth pursuit is noted. Hearing is grossly intact. Face is symmetric with normal facial animation and normal facial sensation. Speech is clear with no dysarthria noted. There is no hypophonia. There is no lip, neck/head, jaw or voice tremor. Neck is supple with full range of passive and active motion. There are no carotid bruits on auscultation. Oropharynx exam reveals: moderate mouth dryness, adequate dental hygiene and mild airway crowding, due to narrow airway entry and redundant soft palate. Mallampati is class II. Tongue protrudes centrally and palate elevates symmetrically. Tonsils are absent. She has a Absent overbite. Nasal inspection reveals no significant nasal mucosal bogginess or redness and no septal deviation.   Chest: Clear to auscultation without wheezing, rhonchi or crackles noted.  Heart: S1+S2+0, regular and normal without murmurs, rubs or gallops noted.   Abdomen: Soft, non-tender and non-distended with normal bowel sounds appreciated on auscultation.  Extremities: There is no pitting edema in the distal lower extremities bilaterally. Pedal pulses are intact.  Skin: Warm and dry without trophic changes noted. There are no varicose veins.  Musculoskeletal: exam reveals no obvious joint deformities, tenderness or joint swelling or erythema.    Neurologically:  Mental status: The patient is awake, alert and oriented in all 4 spheres. Her immediate and remote memory, attention, language skills and fund of knowledge are appropriate. There is no evidence of aphasia, agnosia, apraxia or anomia. Speech is clear with normal prosody and enunciation. Thought process is linear. Mood is normal and affect is normal.  Cranial nerves II - XII are as described above under HEENT exam. In addition: shoulder shrug is normal with equal shoulder height noted. Motor exam: Normal bulk, strength and tone is noted. There is no drift, tremor or rebound. Romberg is negative. Reflexes are 2+ throughout. Babinski: Toes are flexor bilaterally. Fine motor skills and coordination: intact with normal finger taps, normal hand movements, normal rapid alternating patting, normal foot taps and normal foot agility.  Cerebellar testing: No dysmetria or intention tremor on finger to nose testing. Heel to shin is unremarkable bilaterally. There is no truncal or gait ataxia.  Sensory exam: intact to light touch, pinprick, vibration, temperature sense in the upper and lower extremities.  Gait, station and balance: She stands easily. No veering to one side is noted. No leaning to one side is noted. Posture is age-appropriate and stance is narrow based. Gait shows normal stride length and normal pace. No problems turning are noted. She turns en bloc. Tandem walk is unremarkable.  Assessment and Plan:   In summary, Raysha Blust is a very pleasant 59 year old female with an underlying medical history of bipolar disorder, obesity, mitral valve prolapse, hyperlipidemia, allergic rhinitis, palpitations, constipation, heart murmur, chronic cough, reflux disease, anxiety, and low back pain status  post back surgery in 2001, arthritis, status post left total knee arthroplasty and right total knee arthroplasty, tonsillectomy, and hysterectomy, and status post nasal surgeries twice, who reports  presents for follow-up consultation of her recent diagnosis of obstructive sleep apnea. We talked about her baseline sleep study from January and her CPAP titration study from March 2016 in detail today. She is compliant with treatment. Of note, she skipped a few days in the last 30 days because she went on a trip and did not take her machine with her. Overall, she has noted improvement in her sleep quality, her dreaming, her daytime somnolence and her morning headaches. She is commended for treatment adherence but is advised not to skip any night sweats if at all possible. She has some recent stressors and has been eating more secondary to stress. She is encouraged to try to lose weight. She is motivated to do so. Her physical exam is stable and nonfocal. She is reassured in that regard. Her mood disorder is stable overall. She is advised to continue using CPAP regularly. We talked about her compliance data and I explained the findings as well. I explained the importance of being compliant with PAP treatment, not only for insurance purposes but primarily to improve Her symptoms, and for the patient's long term health benefit, including to reduce Her cardiovascular risks. I would like to see her back in 6 months, sooner if the need arises. I answered all her questions today and the patient was in agreement. I encouraged her to call with any interim questions, concerns, problems or updates.  I spent 25 minutes in total face-to-face time with the patient, more than 50% of which was spent in counseling and coordination of care, reviewing test results, reviewing medication and discussing or reviewing the diagnosis of OSA, its prognosis and treatment options.

## 2015-04-04 ENCOUNTER — Other Ambulatory Visit: Payer: Self-pay | Admitting: Family Medicine

## 2015-04-05 NOTE — Telephone Encounter (Signed)
Med filled.  

## 2015-04-29 ENCOUNTER — Encounter: Payer: Self-pay | Admitting: General Practice

## 2015-04-29 ENCOUNTER — Other Ambulatory Visit: Payer: Self-pay | Admitting: General Practice

## 2015-04-29 MED ORDER — FENOFIBRATE 160 MG PO TABS: 160.0000 mg | ORAL_TABLET | Freq: Every day | ORAL | Status: DC

## 2015-05-08 ENCOUNTER — Ambulatory Visit (INDEPENDENT_AMBULATORY_CARE_PROVIDER_SITE_OTHER): Payer: Medicare Other | Admitting: Family Medicine

## 2015-05-08 ENCOUNTER — Encounter: Payer: Self-pay | Admitting: Family Medicine

## 2015-05-08 VITALS — BP 122/74 | HR 63 | Temp 98.4°F | Resp 18 | Wt 228.0 lb

## 2015-05-08 DIAGNOSIS — R5383 Other fatigue: Secondary | ICD-10-CM | POA: Diagnosis not present

## 2015-05-08 DIAGNOSIS — R29898 Other symptoms and signs involving the musculoskeletal system: Secondary | ICD-10-CM

## 2015-05-08 LAB — BASIC METABOLIC PANEL
BUN: 19 mg/dL (ref 6–23)
CO2: 27 mEq/L (ref 19–32)
Calcium: 9.6 mg/dL (ref 8.4–10.5)
Chloride: 103 mEq/L (ref 96–112)
Creatinine, Ser: 0.7 mg/dL (ref 0.40–1.20)
GFR: 91.43 mL/min (ref >60.00)
GLUCOSE: 119 mg/dL — AB (ref 70–99)
POTASSIUM: 4.1 meq/L (ref 3.5–5.1)
Sodium: 139 mEq/L (ref 135–145)

## 2015-05-08 LAB — CBC WITH DIFFERENTIAL/PLATELET
Basophils Absolute: 0 10*3/uL (ref 0.0–0.1)
Basophils Relative: 0.3 % (ref 0.0–3.0)
Eosinophils Absolute: 0 10*3/uL (ref 0.0–0.7)
Eosinophils Relative: 0 % (ref 0.0–5.0)
HCT: 37.8 % (ref 36.0–46.0)
HEMOGLOBIN: 13 g/dL (ref 12.0–15.0)
Lymphocytes Relative: 22 % (ref 12.0–46.0)
Lymphs Abs: 0.8 10*3/uL (ref 0.7–4.0)
MCHC: 34.4 g/dL (ref 30.0–36.0)
MCV: 87.8 fl (ref 78.0–100.0)
MONO ABS: 0.2 10*3/uL (ref 0.1–1.0)
MONOS PCT: 5.7 % (ref 3.0–12.0)
NEUTROS ABS: 2.8 10*3/uL (ref 1.4–7.7)
Neutrophils Relative %: 72 % (ref 43.0–77.0)
Platelets: 253 10*3/uL (ref 150.0–400.0)
RBC: 4.31 Mil/uL (ref 3.87–5.11)
RDW: 14.1 % (ref 11.5–15.5)
WBC: 3.9 10*3/uL — ABNORMAL LOW (ref 4.0–10.5)

## 2015-05-08 LAB — HEPATIC FUNCTION PANEL
ALBUMIN: 4.7 g/dL (ref 3.5–5.2)
ALT: 14 U/L (ref 0–35)
AST: 13 U/L (ref 0–37)
Alkaline Phosphatase: 29 U/L — ABNORMAL LOW (ref 39–117)
Bilirubin, Direct: 0.1 mg/dL (ref 0.0–0.3)
Total Bilirubin: 0.5 mg/dL (ref 0.2–1.2)
Total Protein: 7.4 g/dL (ref 6.0–8.3)

## 2015-05-08 LAB — B12 AND FOLATE PANEL
Folate: >24.8 ng/mL (ref >5.9)
Vitamin B-12: 287 pg/mL (ref 211–911)

## 2015-05-08 LAB — TSH: TSH: 1.52 u[IU]/mL (ref 0.35–4.50)

## 2015-05-08 LAB — CK: Total CK: 123 U/L (ref 7–177)

## 2015-05-08 LAB — SEDIMENTATION RATE: Sed Rate: 10 mm/hr (ref 0–22)

## 2015-05-08 NOTE — Progress Notes (Signed)
Pre visit review using our clinic review tool, if applicable. No additional management support is needed unless otherwise documented below in the visit note. 

## 2015-05-08 NOTE — Assessment & Plan Note (Signed)
Recurrent problem for pt.  Reports she is compliant w/ CPAP, getting good rest.  sxs may be viral in nature and in that case would be self limiting but will get labs to r/o metabolic cause.  Reviewed supportive care and red flags that should prompt return.  Pt expressed understanding and is in agreement w/ plan.

## 2015-05-08 NOTE — Patient Instructions (Signed)
Follow up as needed We'll notify you of your lab results and make any changes if needed If no improvement in the next 1-2 weeks, or if your symptoms are worsening, please let me know so that we can move forward with your work up Make sure you are drinking plenty of fluids and resting as needed Call with any questions or concerns Hang in there!!!

## 2015-05-08 NOTE — Assessment & Plan Note (Signed)
New.  Check labs to r/o statin myopathy, ESR to r/o systemic inflammatory process, CBC to r/o infxn.  PE WNL.  sxs are not consistent w/ claudication as there is no pain or burning, just weakness.  If no improvement in sxs and labs are unrevealing, will refer to rheum.  Pt expressed understanding and is in agreement w/ plan.

## 2015-05-08 NOTE — Progress Notes (Signed)
   Subjective:    Patient ID: Waynette Buttery, female    DOB: 09/30/1956, 59 y.o.   MRN: [id number]  HPI Fatigue- recurrent problem for pt.  Pt reports getting plenty of rest, using CPAP.  + nausea, 'bad HA', 'weak'.  'it just doesn't feel normal to me'.  Denies medication changes.  sxs started 2.5 weeks ago.  No CP, SOB, visual changes, abd pain.  No known sick contacts.  Muscle weakness- pt reports 'a weakness in my legs'.  Walking regularly.  Pt reports that legs will feel 'tired and weak' in her thighs bilaterally w/ walking.  Denies burning or pain- 'they're just very very weak'.  Not consistent w/ claudication.   Review of Systems For ROS see HPI     Objective:   Physical Exam  Constitutional: She is oriented to person, place, and time. She appears well-developed and well-nourished. No distress.  HENT:  Head: Normocephalic and atraumatic.  Eyes: Conjunctivae and EOM are normal. Pupils are equal, round, and reactive to light.  Neck: Normal range of motion. Neck supple. No thyromegaly present.  Cardiovascular: Normal rate, regular rhythm, normal heart sounds and intact distal pulses.   No murmur heard. Pulmonary/Chest: Effort normal and breath sounds normal. No respiratory distress.  Abdominal: Soft. She exhibits no distension. There is no tenderness.  Musculoskeletal: She exhibits no edema or tenderness (no tenderness w/ palpation over upper or lower legs bilaterally).  Full ROM of knees and hips bilaterally  Lymphadenopathy:    She has no cervical adenopathy.  Neurological: She is alert and oriented to person, place, and time.  Skin: Skin is warm and dry.  Psychiatric: She has a normal mood and affect. Her behavior is normal.  Vitals reviewed.         Assessment & Plan:

## 2015-05-09 ENCOUNTER — Other Ambulatory Visit (INDEPENDENT_AMBULATORY_CARE_PROVIDER_SITE_OTHER): Payer: Medicare Other

## 2015-05-09 DIAGNOSIS — R7309 Other abnormal glucose: Secondary | ICD-10-CM | POA: Diagnosis not present

## 2015-05-09 LAB — HEMOGLOBIN A1C: Hgb A1c MFr Bld: 6.2 % (ref 4.6–6.5)

## 2015-05-10 ENCOUNTER — Telehealth: Payer: Self-pay | Admitting: Family Medicine

## 2015-05-10 DIAGNOSIS — M79604 Pain in right leg: Secondary | ICD-10-CM

## 2015-05-10 DIAGNOSIS — M79605 Pain in left leg: Secondary | ICD-10-CM

## 2015-05-10 DIAGNOSIS — R29898 Other symptoms and signs involving the musculoskeletal system: Secondary | ICD-10-CM

## 2015-05-10 NOTE — Telephone Encounter (Signed)
Spoke with pt and advised on lab results. We were disconnected before i could tell her that per PCP since she is still having leg weakness and pain we are going to complete a rheumatology referral. This was placed today.

## 2015-05-10 NOTE — Telephone Encounter (Signed)
Pt calling for results of A1C. She said she is still feeling weak. Please call her at [phone].

## 2015-05-10 NOTE — Telephone Encounter (Signed)
Pt called back. No answer to transfer. Advised her of Rheumatology referral. She said that was fine.

## 2015-05-16 ENCOUNTER — Encounter: Payer: Self-pay | Admitting: Family Medicine

## 2015-06-19 ENCOUNTER — Encounter: Payer: Self-pay | Admitting: Family Medicine

## 2015-06-19 ENCOUNTER — Ambulatory Visit (INDEPENDENT_AMBULATORY_CARE_PROVIDER_SITE_OTHER): Payer: Medicare Other | Admitting: Family Medicine

## 2015-06-19 VITALS — BP 126/78 | HR 76 | Temp 98.6°F | Resp 16 | Wt 224.1 lb

## 2015-06-19 DIAGNOSIS — E785 Hyperlipidemia, unspecified: Secondary | ICD-10-CM

## 2015-06-19 DIAGNOSIS — M6281 Muscle weakness (generalized): Secondary | ICD-10-CM | POA: Diagnosis not present

## 2015-06-19 LAB — TSH: TSH: 1.49 u[IU]/mL (ref 0.35–4.50)

## 2015-06-19 LAB — CBC WITH DIFFERENTIAL/PLATELET
BASOS ABS: 0 10*3/uL (ref 0.0–0.1)
Basophils Relative: 0.3 % (ref 0.0–3.0)
EOS PCT: 0 % (ref 0.0–5.0)
Eosinophils Absolute: 0 10*3/uL (ref 0.0–0.7)
HEMATOCRIT: 36.7 % (ref 36.0–46.0)
Hemoglobin: 12.7 g/dL (ref 12.0–15.0)
LYMPHS ABS: 1.1 10*3/uL (ref 0.7–4.0)
Lymphocytes Relative: 27.9 % (ref 12.0–46.0)
MCHC: 34.7 g/dL (ref 30.0–36.0)
MCV: 88.7 fl (ref 78.0–100.0)
MONOS PCT: 6.1 % (ref 3.0–12.0)
Monocytes Absolute: 0.2 10*3/uL (ref 0.1–1.0)
Neutro Abs: 2.5 10*3/uL (ref 1.4–7.7)
Neutrophils Relative %: 65.7 % (ref 43.0–77.0)
Platelets: 205 10*3/uL (ref 150.0–400.0)
RBC: 4.14 Mil/uL (ref 3.87–5.11)
RDW: 14 % (ref 11.5–15.5)
WBC: 3.8 10*3/uL — ABNORMAL LOW (ref 4.0–10.5)

## 2015-06-19 LAB — BASIC METABOLIC PANEL
BUN: 17 mg/dL (ref 6–23)
CALCIUM: 9.6 mg/dL (ref 8.4–10.5)
CO2: 25 meq/L (ref 19–32)
CREATININE: 0.54 mg/dL (ref 0.40–1.20)
Chloride: 104 mEq/L (ref 96–112)
GFR: 123.31 mL/min (ref >60.00)
GLUCOSE: 116 mg/dL — AB (ref 70–99)
Potassium: 3.9 mEq/L (ref 3.5–5.1)
Sodium: 140 mEq/L (ref 135–145)

## 2015-06-19 LAB — HEPATIC FUNCTION PANEL
ALBUMIN: 4.5 g/dL (ref 3.5–5.2)
ALK PHOS: 31 U/L — AB (ref 39–117)
ALT: 17 U/L (ref 0–35)
AST: 12 U/L (ref 0–37)
Bilirubin, Direct: 0.1 mg/dL (ref 0.0–0.3)
Total Bilirubin: 0.7 mg/dL (ref 0.2–1.2)
Total Protein: 7.1 g/dL (ref 6.0–8.3)

## 2015-06-19 LAB — CK: Total CK: 128 U/L (ref 7–177)

## 2015-06-19 LAB — SEDIMENTATION RATE: Sed Rate: 8 mm/hr (ref 0–22)

## 2015-06-19 NOTE — Progress Notes (Signed)
   Subjective:    Patient ID: [name], female    DOB: 1956/01/23, 59 y.o.   MRN: [id number]  HPI Weakness/fatigue- pt was seen on 6/29 w/ similar sxs.  At that time, pt's labs were all normal.  Pt reports that 1 week ago she woke w/ a sore throat.  Since that time, she has had HAs, body aches, sweats, fatigue.  Pt reports she is having a hard time taking small dog for a walk due to myalgias.  Pt reports her previous sxs resolved after stopping simvastatin.  Current sxs and weakness just started 1 week ago after recent illness.  Hyperlipidemia- chronic problem for pt.  Stopped her simvastatin due to body aches.  Previously seeing lipid clinic but stopped going.  Review of Systems For ROS see HPI     Objective:   Physical Exam  Constitutional: She is oriented to person, place, and time. She appears well-developed and well-nourished. No distress.  HENT:  Head: Normocephalic and atraumatic.  Eyes: Conjunctivae and EOM are normal. Pupils are equal, round, and reactive to light.  Neck: Normal range of motion. Neck supple. No thyromegaly present.  Cardiovascular: Normal rate, regular rhythm, normal heart sounds and intact distal pulses.   No murmur heard. Pulmonary/Chest: Effort normal and breath sounds normal. No respiratory distress.  Abdominal: Soft. She exhibits no distension. There is no tenderness.  Musculoskeletal: She exhibits no edema or tenderness (no TTP over muscles in arms, legs).  Lymphadenopathy:    She has no cervical adenopathy.  Neurological: She is alert and oriented to person, place, and time. No cranial nerve deficit. Coordination normal.  Strength 5/5 throughout  Skin: Skin is warm and dry.  Psychiatric: She has a normal mood and affect. Her behavior is normal.  Vitals reviewed.         Assessment & Plan:

## 2015-06-19 NOTE — Assessment & Plan Note (Signed)
Pt has hx of this w/ statin use but this episode of weakness is not due to statin use.  Pt has always had normal lab w/u when she notes these sxs.  Suspect that her muscle aches and weakness on this occasion are due to viral illness given preceding sore throat.  Repeat labs but explained to pt that viral illnesses can last 7-14 days and that these sxs are consistent w/ viral illness.  Will follow.

## 2015-06-19 NOTE — Patient Instructions (Signed)
We'll determine your follow up based on your lab results Allow yourself time to rest but also try and maintain your activity as this will prevent additional soreness Drink plenty of fluids Call with any questions or concerns Hang in there!!

## 2015-06-19 NOTE — Assessment & Plan Note (Signed)
Chronic problem.  Pt stopped her statin due to muscle aches.  Pt was previously seeing lipid clinic but stopped going.  Hx of poor lipoprofile and family hx.  Will repeat lipoprofile and adjust tx prn.

## 2015-06-19 NOTE — Progress Notes (Signed)
Pre visit review using our clinic review tool, if applicable. No additional management support is needed unless otherwise documented below in the visit note. 

## 2015-06-21 ENCOUNTER — Other Ambulatory Visit: Payer: Self-pay | Admitting: General Practice

## 2015-06-21 DIAGNOSIS — E785 Hyperlipidemia, unspecified: Secondary | ICD-10-CM

## 2015-06-21 LAB — NMR LIPOPROFILE WITH LIPIDS
Cholesterol, Total: 312 mg/dL — ABNORMAL HIGH (ref 100–199)
HDL Particle Number: 32.2 umol/L (ref >=30.5)
HDL Size: 8.5 nm — ABNORMAL LOW (ref >=9.2)
HDL-C: 44 mg/dL (ref >39)
LARGE VLDL-P: 48.8 nmol/L — AB (ref <=2.7)
LDL PARTICLE NUMBER: 3424 nmol/L — AB (ref <1000)
LDL SIZE: 19.8 nm (ref >=20.8)
LP-IR Score: 95 — ABNORMAL HIGH (ref <=45)
Large HDL-P: <1.3 umol/L — ABNORMAL LOW (ref >=4.8)
SMALL LDL PARTICLE NUMBER: 2274 nmol/L — AB (ref <=527)
Triglycerides: 657 mg/dL — ABNORMAL HIGH (ref 0–149)
VLDL SIZE: 61.6 nm — AB (ref <=46.6)

## 2015-06-21 MED ORDER — ROSUVASTATIN CALCIUM 20 MG PO TABS
20.0000 mg | ORAL_TABLET | Freq: Every day | ORAL | Status: DC
Start: 2015-06-21 — End: 2016-02-10

## 2015-07-01 ENCOUNTER — Other Ambulatory Visit: Payer: Self-pay | Admitting: Family Medicine

## 2015-07-01 NOTE — Telephone Encounter (Signed)
Medication filled to pharmacy as requested.   

## 2015-07-02 ENCOUNTER — Telehealth: Payer: Self-pay | Admitting: Family Medicine

## 2015-07-02 NOTE — Telephone Encounter (Signed)
Caller name: Relationship to patient: Can be reached: Pharmacy:  Reason for call: Pt states she cannot take Crestor is making her sick. She said simvastatin did same thing. Very tired, legs tired, nauseous. She is asking if pravastatin would be comparable? Please return call. She also said she can't afford the lipid clinic bc the copay is so high.

## 2015-07-03 MED ORDER — PRAVASTATIN SODIUM 40 MG PO TABS: 40.0000 mg | ORAL_TABLET | Freq: Every day | ORAL | Status: DC

## 2015-07-03 NOTE — Telephone Encounter (Signed)
Med filled, will notify pt.  

## 2015-07-03 NOTE — Telephone Encounter (Signed)
We can try pravastatin 40mg  nightly for pt

## 2015-07-11 LAB — HM PAP SMEAR

## 2015-07-11 LAB — HM DEXA SCAN

## 2015-07-11 LAB — HM MAMMOGRAPHY

## 2015-08-07 ENCOUNTER — Ambulatory Visit: Payer: Medicare Other

## 2015-08-13 ENCOUNTER — Telehealth: Payer: Self-pay

## 2015-08-13 ENCOUNTER — Ambulatory Visit (INDEPENDENT_AMBULATORY_CARE_PROVIDER_SITE_OTHER): Payer: Medicare Other

## 2015-08-13 VITALS — BP 108/62 | HR 67 | Temp 98.0°F | Ht 69.0 in | Wt 224.8 lb

## 2015-08-13 DIAGNOSIS — Z114 Encounter for screening for human immunodeficiency virus [HIV]: Secondary | ICD-10-CM | POA: Diagnosis not present

## 2015-08-13 DIAGNOSIS — Z1159 Encounter for screening for other viral diseases: Secondary | ICD-10-CM | POA: Diagnosis not present

## 2015-08-13 DIAGNOSIS — Z23 Encounter for immunization: Secondary | ICD-10-CM | POA: Diagnosis not present

## 2015-08-13 DIAGNOSIS — Z0000 Encounter for general adult medical examination without abnormal findings: Secondary | ICD-10-CM

## 2015-08-13 NOTE — Progress Notes (Signed)
   Subjective:    Patient ID: [name], female    DOB: 20-Jun-1956, 59 y.o.   MRN: [id number]  HPI Agree w/ documentation as provided by RN   Review of Systems     Objective:   Physical Exam Pending CPE       Assessment & Plan:  F/u for CPE w/ me

## 2015-08-13 NOTE — Patient Instructions (Addendum)
Follow up with [name] for CPE.  Contact [facility]- inquire about payment plans.    Make time for family and friends.  Healthy relationships are important.  Take medications as directed by your provider.  Maintain a healthy weight and a trim waistline.  Eat healthy meals and snacks, rich in fruits, vegetables, whole grains and lean proteins.  Aim for 150 minutes of moderate physical activity each week.  Don't smoke.  Avoid alcohol or drink in moderation.  Manage stress through prayer, meditation or mindful relaxation.  Get seven to nine hours of quality sleep each night.    Fat and Cholesterol Control Diet Fat and cholesterol levels in your blood and organs are influenced by your diet. High levels of fat and cholesterol may lead to diseases of the heart, small and large blood vessels, gallbladder, liver, and pancreas. CONTROLLING FAT AND CHOLESTEROL WITH DIET Although exercise and lifestyle factors are important, your diet is key. That is because certain foods are known to raise cholesterol and others to lower it. The goal is to balance foods for their effect on cholesterol and more importantly, to replace saturated and trans fat with other types of fat, such as monounsaturated fat, polyunsaturated fat, and omega-3 fatty acids. On average, a person should consume no more than 15 to 17 g of saturated fat daily. Saturated and trans fats are considered "bad" fats, and they will raise LDL cholesterol. Saturated fats are primarily found in animal products such as meats, butter, and cream. However, that does not mean you need to give up all your favorite foods. Today, there are good tasting, low-fat, low-cholesterol substitutes for most of the things you like to eat. Choose low-fat or nonfat alternatives. Choose round or loin cuts of red meat. These types of cuts are lowest in fat and cholesterol. Chicken (without the skin), fish, veal, and ground [place] breast are great choices. Eliminate  fatty meats, such as hot dogs and salami. Even shellfish have little or no saturated fat. Have a 3 oz (85 g) portion when you eat lean meat, poultry, or fish. Trans fats are also called "partially hydrogenated oils." They are oils that have been scientifically manipulated so that they are solid at room temperature resulting in a longer shelf life and improved taste and texture of foods in which they are added. Trans fats are found in stick margarine, some tub margarines, cookies, crackers, and baked goods.  When baking and cooking, oils are a great substitute for butter. The monounsaturated oils are especially beneficial since it is believed they lower LDL and raise HDL. The oils you should avoid entirely are saturated tropical oils, such as coconut and palm.  Remember to eat a lot from food groups that are naturally free of saturated and trans fat, including fish, fruit, vegetables, beans, grains (barley, rice, couscous, bulgur wheat), and pasta (without cream sauces).  IDENTIFYING FOODS THAT LOWER FAT AND CHOLESTEROL  Soluble fiber may lower your cholesterol. This type of fiber is found in fruits such as apples, vegetables such as broccoli, potatoes, and carrots, legumes such as beans, peas, and lentils, and grains such as barley. Foods fortified with plant sterols (phytosterol) may also lower cholesterol. You should eat at least 2 g per day of these foods for a cholesterol lowering effect.  Read package labels to identify low-saturated fats, trans fat free, and low-fat foods at the supermarket. Select cheeses that have only 2 to 3 g saturated fat per ounce. Use a heart-healthy tub margarine that  is free of trans fats or partially hydrogenated oil. When buying baked goods (cookies, crackers), avoid partially hydrogenated oils. Breads and muffins should be made from whole grains (whole-wheat or whole oat flour, instead of "flour" or "enriched flour"). Buy non-creamy canned soups with reduced salt and no added  fats.  FOOD PREPARATION TECHNIQUES  Never deep-fry. If you must fry, either stir-fry, which uses very little fat, or use non-stick cooking sprays. When possible, broil, bake, or roast meats, and steam vegetables. Instead of putting butter or margarine on vegetables, use lemon and herbs, applesauce, and cinnamon (for squash and sweet potatoes). Use nonfat yogurt, salsa, and low-fat dressings for salads.  LOW-SATURATED FAT / LOW-FAT FOOD SUBSTITUTES Meats / Saturated Fat (g)  Avoid: Steak, marbled (3 oz/85 g) / 11 g  Choose: Steak, lean (3 oz/85 g) / 4 g  Avoid: Hamburger (3 oz/85 g) / 7 g  Choose: Hamburger, lean (3 oz/85 g) / 5 g  Avoid: Ham (3 oz/85 g) / 6 g  Choose: Ham, lean cut (3 oz/85 g) / 2.4 g  Avoid: Chicken, with skin, dark meat (3 oz/85 g) / 4 g  Choose: Chicken, skin removed, dark meat (3 oz/85 g) / 2 g  Avoid: Chicken, with skin, light meat (3 oz/85 g) / 2.5 g  Choose: Chicken, skin removed, light meat (3 oz/85 g) / 1 g Dairy / Saturated Fat (g)  Avoid: Whole milk (1 cup) / 5 g  Choose: Low-fat milk, 2% (1 cup) / 3 g  Choose: Low-fat milk, 1% (1 cup) / 1.5 g  Choose: Skim milk (1 cup) / 0.3 g  Avoid: Hard cheese (1 oz/28 g) / 6 g  Choose: Skim milk cheese (1 oz/28 g) / 2 to 3 g  Avoid: Cottage cheese, 4% fat (1 cup) / 6.5 g  Choose: Low-fat cottage cheese, 1% fat (1 cup) / 1.5 g  Avoid: Ice cream (1 cup) / 9 g  Choose: Sherbet (1 cup) / 2.5 g  Choose: Nonfat frozen yogurt (1 cup) / 0.3 g  Choose: Frozen fruit bar / trace  Avoid: Whipped cream (1 tbs) / 3.5 g  Choose: Nondairy whipped topping (1 tbs) / 1 g Condiments / Saturated Fat (g)  Avoid: Mayonnaise (1 tbs) / 2 g  Choose: Low-fat mayonnaise (1 tbs) / 1 g  Avoid: Butter (1 tbs) / 7 g  Choose: Extra light margarine (1 tbs) / 1 g  Avoid: Coconut oil (1 tbs) / 11.8 g  Choose: Olive oil (1 tbs) / 1.8 g  Choose: Corn oil (1 tbs) / 1.7 g  Choose: Safflower oil (1 tbs) / 1.2 g  Choose:  Sunflower oil (1 tbs) / 1.4 g  Choose: Soybean oil (1 tbs) / 2.4 g  Choose: Canola oil (1 tbs) / 1 g Document Released: 10/26/2005 Document Revised: 02/20/2013 Document Reviewed: 01/24/2014 [employer] Patient Information 2015 [place], [place]. This information is not intended to replace advice given to you by your health care provider. Make sure you discuss any questions you have with your health care provider.  Fat and Cholesterol Control Diet Your diet has an affect on your fat and cholesterol levels in your blood and organs. Too much fat and cholesterol in your blood can affect your:  Heart.  Blood vessels (arteries, veins).  Gallbladder.  Liver.  Pancreas. CONTROL FAT AND CHOLESTEROL WITH DIET Certain foods raise cholesterol and others lower it. It is important to replace bad fats with other types of fat.  Do  not eat:  Fatty meats, such as hot dogs and salami.  Stick margarine and some tub margarines that have "partially hydrogenated oils" in them.  Baked goods, such as cookies and crackers that have "partially hydrogenated oils" in them.  Saturated tropical oils, such as coconut and palm oil. Eat the following foods:  Round or loin cuts of red meat.  Chicken (without skin).  Fish.  Veal.  Ground [place] breast.  Shellfish.  Fruit, such as apples.  Vegetables, such as broccoli, potatoes, and carrots.  Beans, peas, and lentils (legumes).  Grains, such as barley, rice, couscous, and bulgar wheat.  Pasta (without cream sauces). Look for foods that are nonfat, low in fat, and low in cholesterol.  FIND FOODS THAT ARE LOWER IN FAT AND CHOLESTEROL  Find foods with soluble fiber and plant sterols (phytosterol). You should eat 2 grams a day of these foods. These foods include:  Fruits.  Vegetables.  Whole grains.  Dried beans and peas.  Nuts and seeds.  Read package labels. Look for low-saturated fats, trans fat free, low-fat foods.  Choose cheese that  have only 2 to 3 grams of saturated fat per ounce.  Use heart-healthy tub margarine that is free of trans fat or partially hydrogenated oil.  Avoid buying baked goods that have partially hydrogenated oils in them. Instead, buy baked goods made with whole grains (whole-wheat or whole oat flour). Avoid baked goods labeled with "flour" or "enriched flour."  Buy non-creamy canned soups with reduced salt and no added fats. PREPARING YOUR FOOD  Broil, bake, steam, or roast foods. Do not fry food.  Use non-stick cooking sprays.  Use lemon or herbs to flavor food instead of using butter or stick margarine.  Use nonfat yogurt, salsa, or low-fat dressings for salads. LOW-SATURATED FAT / LOW-FAT FOOD SUBSTITUTES  Meats / Saturated Fat (g)  Avoid: Steak, marbled (3 oz/85 g) / 11 g.  Choose: Steak, lean (3 oz/85 g) / 4 g.  Avoid: Hamburger (3 oz/85 g) / 7 g.  Choose: Hamburger, lean (3 oz/85 g) / 5 g.  Avoid: Ham (3 oz/85 g) / 6 g.  Choose: Ham, lean cut (3 oz/85 g) / 2.4 g.  Avoid: Chicken, with skin, dark meat (3 oz/85 g) / 4 g.  Choose: Chicken, skin removed, dark meat (3 oz/85 g) / 2 g.  Avoid: Chicken, with skin, light meat (3 oz/85 g) / 2.5 g.  Choose: Chicken, skin removed, light meat (3 oz/85 g) / 1 g. Dairy / Saturated Fat (g)  Avoid: Whole milk (1 cup) / 5 g.  Choose: Low-fat milk, 2% (1 cup) / 3 g.  Choose: Low-fat milk, 1% (1 cup) / 1.5 g.  Choose: Skim milk (1 cup) / 0.3 g.  Avoid: Hard cheese (1 oz/28 g) / 6 g.  Choose: Skim milk cheese (1 oz/28 g) / 2 to 3 g.  Avoid: Cottage cheese, 4% fat (1 cup) / 6.5 g.  Choose: Low-fat cottage cheese, 1% fat (1 cup) / 1.5 g.  Avoid: Ice cream (1 cup) / 9 g.  Choose: Sherbet (1 cup) / 2.5 g.  Choose: Nonfat frozen yogurt (1 cup) / 0.3 g.  Choose: Frozen fruit bar / trace.  Avoid: Whipped cream (1 tbs) / 3.5 g.  Choose: Nondairy whipped topping (1 tbs) / 1 g. Condiments / Saturated Fat (g)  Avoid: Mayonnaise (1  tbs) / 2 g.  Choose: Low-fat mayonnaise (1 tbs) / 1 g.  Avoid: Butter (1 tbs) /  7 g.  Choose: Extra light margarine (1 tbs) / 1 g.  Avoid: Coconut oil (1 tbs) / 11.8 g.  Choose: Olive oil (1 tbs) / 1.8 g.  Choose: Corn oil (1 tbs) / 1.7 g.  Choose: Safflower oil (1 tbs) / 1.2 g.  Choose: Sunflower oil (1 tbs) / 1.4 g.  Choose: Soybean oil (1 tbs) / 2.4 g .  Choose: Canola oil (1 tbs) / 1 g. Document Released: 04/26/2012 Document Revised: 06/28/2013 Document Reviewed: 01/25/2014 [facility] Patient Information 2015 [place], [place]. This information is not intended to replace advice given to you by your health care provider. Make sure you discuss any questions you have with your health care provider.

## 2015-08-13 NOTE — Progress Notes (Signed)
Pre visit review using our clinic review tool, if applicable. No additional management support is needed unless otherwise documented below in the visit note. 

## 2015-08-13 NOTE — Telephone Encounter (Signed)
Noted  

## 2015-08-13 NOTE — Progress Notes (Signed)
Subjective:   [name] is a 59 y.o. female who presents for an Initial Medicare Annual Wellness Visit.    Review of Systems: No ROS     Sleep patterns: Sleeps with CPAP.  Sleeps 6-7 hours per night. Sometimes wakes up during the night due to anxiety.  Home Safety/Smoke Alarms: Feels safe at home.  Lives alone with 2 cats.  Firearm Safety: Keeps firearm in safe place.  Seat Belt Safety/Bike Helmet:  Always wears seat belt.    Counseling:   Eye Exam- 10/2014--[facility]- Goes every 6 months. Female:  Pap-Hysterectomy-Last Pelvic exam 07/2015     Mammo-07/2015     Dexa scan-07/2015   CCS- 12/10/06--Plans to schedule.    Flu vaccine given during visit.   Objective:    Today's Vitals   08/13/15 0930  BP: 108/62  Pulse: 67  Temp: 98 F (36.7 C)  TempSrc: Oral  Height: 5\' 9"  (1.753 m)  Weight: 224 lb 12.8 oz (101.969 kg)  SpO2: 94%  PainSc: 0-No pain    Current Medications (verified) Outpatient Encounter Prescriptions as of 08/13/2015  Medication Sig  . atenolol (TENORMIN) 50 MG tablet TAKE 1 TABLET BY MOUTH EVERY DAY  . clonazePAM (KLONOPIN) 1 MG tablet Take 1 mg by mouth 4 (four) times daily as needed for anxiety.   Marland Kitchen estrogens, conjugated, (PREMARIN) 0.625 MG tablet Take 0.625 mg by mouth daily.   Marland Kitchen FLOVENT HFA 110 MCG/ACT inhaler   . gabapentin (NEURONTIN) 600 MG tablet Take 600 mg by mouth 3 (three) times daily.  Marland Kitchen ibuprofen (ADVIL,MOTRIN) 600 MG tablet Take 1 tablet (600 mg total) by mouth every 6 (six) hours as needed.  . lamoTRIgine (LAMICTAL) 200 MG tablet Take 200 mg by mouth 2 (two) times daily.   . Omega-3 Fatty Acids (FISH OIL) 1000 MG CAPS Take 1 capsule by mouth daily.  Marland Kitchen omeprazole (PRILOSEC) 40 MG capsule TAKE 1 CAPSULE BY MOUTH ONCE DAILY  . QUEtiapine (SEROQUEL) 25 MG tablet Take 25 mg by mouth 3 (three) times daily.  . QUEtiapine (SEROQUEL) 300 MG tablet Take 300 mg by mouth at bedtime.  Marland Kitchen venlafaxine (EFFEXOR-XR) 75 MG 24 hr capsule Take 225  mg by mouth daily.   Marland Kitchen albuterol (PROAIR HFA) 108 (90 BASE) MCG/ACT inhaler Inhale 2 puffs into the lungs every 4 (four) hours as needed for wheezing. (Patient not taking: Reported on 08/13/2015)  . fenofibrate 160 MG tablet Take 1 tablet (160 mg total) by mouth daily. (Patient not taking: Reported on 08/13/2015)  . meloxicam (MOBIC) 15 MG tablet   . montelukast (SINGULAIR) 10 MG tablet Take 1 tablet (10 mg total) by mouth at bedtime. (Patient not taking: Reported on 08/13/2015)  . pravastatin (PRAVACHOL) 40 MG tablet Take 1 tablet (40 mg total) by mouth at bedtime. (Patient not taking: Reported on 08/13/2015)  . rosuvastatin (CRESTOR) 20 MG tablet Take 1 tablet (20 mg total) by mouth daily. (Patient not taking: Reported on 08/13/2015)  . simvastatin (ZOCOR) 40 MG tablet TAKE 1 TABLET AT BEDTIME (Patient not taking: Reported on 08/13/2015)   No facility-administered encounter medications on file as of 08/13/2015.    Allergies (verified) Nitrofurantoin monohyd macro; Sulfa antibiotics; Alprazolam; Amoxicillin; and Prednisone   History: Past Medical History  Diagnosis Date  . Bipolar disorder ([place])   . MVP (mitral valve prolapse)   . Hyperlipidemia   . Asthma   . Allergic rhinitis   . Arthritis     R knee, Left Knee  .  Urinary tract infection     frequent, see Urololgist 12/16/2010  . Dysrhythmia     Palpations occ, takes Tenormin  . Constipation   . Complication of anesthesia     had bad anxiety after anesthesia  . Heart murmur   . Anxiety   . Constipation, chronic   . GERD (gastroesophageal reflux disease)   . Depression   . Cataract    Past Surgical History  Procedure Laterality Date  . Nasal sinus surgery  01/2010    x2  . Back surgery  2001    L5 - S1  . Abdominal hysterectomy  2002    no oophorectomy  . Tonsillectomy    . Total knee arthroplasty  12/23/2011    Procedure: TOTAL KNEE ARTHROPLASTY;  Surgeon: Ninetta Lights, MD;  Location: [facility];  Service: Orthopedics;   Laterality: Right;  DR MURPHY WANTS 120 MINUTES FOR SURGERY  . Total knee arthroplasty  08/31/2012    left knee  . Total knee arthroplasty  08/31/2012    Procedure: TOTAL KNEE ARTHROPLASTY;  Surgeon: Ninetta Lights, MD;  Location: [facility];  Service: Orthopedics;  Laterality: Left;   Family History  Problem Relation Age of Onset  . Coronary artery disease Father   . Heart attack Father 47  . Heart disease Father   . Hypertension Brother   . Anesthesia problems Neg Hx   . Lung disease Mother     Mycobacterium avium intracellulare   Social History   Occupational History  . retired//disability    Social History Main Topics  . Smoking status: Never Smoker   . Smokeless tobacco: Never Used  . Alcohol Use: 0.0 oz/week    0 Standard drinks or equivalent per week     Comment: Occasional glass of wine/"hardly ever"  . Drug Use: No  . Sexual Activity: No    Tobacco Counseling Counseling given: Not Answered   Activities of Daily Living In your present state of health, do you have any difficulty performing the following activities: 08/13/2015 03/06/2015  Hearing? N N  Vision? N N  Difficulty concentrating or making decisions? N N  Walking or climbing stairs? N N  Dressing or bathing? N N  Doing errands, shopping? N N  Preparing Food and eating ? N -  Using the Toilet? N -  In the past six months, have you accidently leaked urine? N -  Do you have problems with loss of bowel control? N -  Managing your Medications? N -  Managing your Finances? Y -  Housekeeping or managing your Housekeeping? Y -    Immunizations and Health Maintenance Immunization History  Administered Date(s) Administered  . Influenza Split 09/02/2012  . Influenza,inj,Quad PF,36+ Mos 10/09/2013, 08/13/2015  . Pneumococcal Polysaccharide-23 09/02/2012  . Tdap 07/15/2012   Health Maintenance Due  Topic Date Due  . Hepatitis C Screening  1956-05-25  . HIV Screening  08/13/1972    Patient Care  Team: Midge Minium, MD as PCP - General Midge Minium, MD Gatha Mayer, MD as Consulting Physician (Gastroenterology) Arvella Nigh, MD as Consulting Physician (Obstetrics and Gynecology)  Indicate any recent Medical Services you may have received from other than [facility] providers in the past year (date may be approximate).     Assessment:   This is a routine wellness examination for Henslee.   Hyperlipidemia- Last NMR Lipoprofile- 06/19/15-Elevated.  Not on a medication.  Pt states statins make her sick, she cannot afford fenofibrate and lipid clinic  costs $50 up front.  Per patient she currently going through financial difficulty-she's having to move out of her home.  Discussed situation with provider, who suggested patient call lipid clinic to inquire about payment plan.  Pt was also encouraged to continue fish oil, eat heart healthy diet, and increase physical activity.    Obesity- BMI: 33.18 kg/m2. Discussed importance of healthy eating, exercise and weight loss.    Anxiety- Pt is anxious about having to move out of her home.  She is on medication and sees a therapist on a regular basis.  Reports having the emotional support of family and friends.  Denies suicidal and homicidal ideation.    Hearing/Vision screen  Hearing Screening   125Hz  250Hz  500Hz  1000Hz  2000Hz  4000Hz  8000Hz   Right ear:   100      Left ear:   100      Comments: No changes in hearing.   Vision Screening Comments: No changes in vision.   Wears corrective glasses/contacts. Last eye exam:  10/2014--[facility]   Dietary issues and exercise activities discussed: Exercise- Walks dog 6 days per week for 30 mins.    Diet-Consume a lot of sugar.  Tries to choose grilled chicken and vegetables when eating at restaurants.  States she's not eating right.  Stress eats given current financial state.    Goals    . Increase physical activity and eat healthy.    . LDL CALC < 130     LDL-P number goal < 1300 nmol/L       Depression Screen PHQ 2/9 Scores 08/13/2015  PHQ - 2 Score 0    Fall Risk Fall Risk  08/13/2015  Falls in the past year? No    Cognitive Function: MMSE - Mini Mental State Exam 08/13/2015  Orientation to time 5  Orientation to Place 5  Registration 3  Attention/ Calculation 5  Recall 1  Language- name 2 objects 2  Language- repeat 1  Language- follow 3 step command 3  Language- read & follow direction 1  Write a sentence 1  Copy design 1  Total score 28    Screening Tests Health Maintenance  Topic Date Due  . Hepatitis C Screening  [birth year]  . HIV Screening  08/13/1972  . INFLUENZA VACCINE  06/09/2016  . COLONOSCOPY  12/10/2016  . MAMMOGRAM  07/10/2017  . PAP SMEAR  07/10/2018  . TETANUS/TDAP  07/15/2022      Plan:  Follow up with [name] for CPE.  Contact Lipid Clinic- inquire about payment plans.    Make time for family and friends.  Healthy relationships are important.  Take medications as directed by your provider.  Maintain a healthy weight and a trim waistline.  Eat healthy meals and snacks, rich in fruits, vegetables, whole grains and lean proteins.  Aim for 150 minutes of moderate physical activity each week.  Don't smoke.  Avoid alcohol or drink in moderation.  Manage stress through prayer, meditation or mindful relaxation.  Get seven to nine hours of quality sleep each night.      During the course of the visit, Mekisha was educated and counseled about the following appropriate screening and preventive services:   Vaccines to include Pneumoccal, Influenza, Hepatitis B, Td, Zostavax, HCV  Electrocardiogram  Cardiovascular disease screening  Colorectal cancer screening  Bone density screening  Diabetes screening  Glaucoma screening  Mammography/PAP  Nutrition counseling  Smoking cessation counseling  Patient Instructions (the written plan) were given to the patient.  Rudene Anda, RN   08/13/2015

## 2015-08-13 NOTE — Telephone Encounter (Signed)
Pt just had NMR profile done in August- insurance won't pay for repeat testing.  Please read lab note from [place]- she is supposed to be on Crestor and go back to [facility]

## 2015-08-13 NOTE — Telephone Encounter (Signed)
Pt scheduled for Medicare Wellness today at 9:30.  Appt note states that patient would like her cholesterol checked.  She has not been on statin drug.  Please advise.

## 2015-09-03 ENCOUNTER — Encounter: Payer: Self-pay | Admitting: Obstetrics and Gynecology

## 2015-09-11 ENCOUNTER — Ambulatory Visit (INDEPENDENT_AMBULATORY_CARE_PROVIDER_SITE_OTHER): Payer: Medicare Other | Admitting: Family Medicine

## 2015-09-11 ENCOUNTER — Ambulatory Visit (HOSPITAL_BASED_OUTPATIENT_CLINIC_OR_DEPARTMENT_OTHER)
Admission: RE | Admit: 2015-09-11 | Discharge: 2015-09-11 | Disposition: A | Discharge status: Home / Self Care | Payer: Medicare Other | Source: Ambulatory Visit | Attending: Family Medicine | Admitting: Family Medicine

## 2015-09-11 ENCOUNTER — Encounter: Payer: Self-pay | Admitting: Family Medicine

## 2015-09-11 ENCOUNTER — Telehealth: Payer: Self-pay | Admitting: Internal Medicine

## 2015-09-11 DIAGNOSIS — M545 Low back pain, unspecified: Secondary | ICD-10-CM

## 2015-09-11 DIAGNOSIS — M25531 Pain in right wrist: Secondary | ICD-10-CM | POA: Insufficient documentation

## 2015-09-11 DIAGNOSIS — M549 Dorsalgia, unspecified: Secondary | ICD-10-CM | POA: Insufficient documentation

## 2015-09-11 MED ORDER — METHOCARBAMOL 750 MG PO TABS: 750.0000 mg | ORAL_TABLET | Freq: Three times a day (TID) | ORAL | Status: DC | PRN

## 2015-09-11 NOTE — Patient Instructions (Signed)
Follow up as needed Go downstairs and get your xray done Restart the Mobic once daily- take w/ food- for the next 7-10 days ICE the wrist HEAT your back Use the Robaxin at night for spasm Call with any questions or concerns Hang in there!!!

## 2015-09-11 NOTE — Progress Notes (Signed)
Pre visit review using our clinic review tool, if applicable. No additional management support is needed unless otherwise documented below in the visit note. 

## 2015-09-11 NOTE — Telephone Encounter (Signed)
LM on VMail to CB to sch'd colon
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
## 2015-09-11 NOTE — Telephone Encounter (Signed)
See letters tab.  Multiple letters about not being able to reach the patient.  Patient is due now.

## 2015-09-13 NOTE — Assessment & Plan Note (Signed)
New.  TTP over ulnar styloid.  Pain w/ lateral deviation.  Get xrays to assess.  Suspect sprain but must r/o fx.  Start NSAIDs.  Reviewed supportive care and red flags that should prompt return.  Pt expressed understanding and is in agreement w/ plan.
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
## 2015-09-21 ENCOUNTER — Encounter: Payer: Self-pay | Admitting: Internal Medicine

## 2015-09-21 ENCOUNTER — Ambulatory Visit (INDEPENDENT_AMBULATORY_CARE_PROVIDER_SITE_OTHER): Payer: Medicare Other | Admitting: Internal Medicine

## 2015-09-21 VITALS — BP 108/70 | HR 65 | Temp 98.3°F | Resp 16 | Ht 69.0 in | Wt 219.0 lb

## 2015-09-21 DIAGNOSIS — J029 Acute pharyngitis, unspecified: Secondary | ICD-10-CM | POA: Diagnosis not present

## 2015-09-21 DIAGNOSIS — R61 Generalized hyperhidrosis: Secondary | ICD-10-CM

## 2015-09-21 MED ORDER — CEPHALEXIN 500 MG PO CAPS: 500.0000 mg | ORAL_CAPSULE | Freq: Two times a day (BID) | ORAL | Status: DC

## 2015-09-21 NOTE — Patient Instructions (Addendum)
Checking for strep    Culture  test.  Sometimes .   Other infection that is self resolving  Is possible . No alarm features .  We can add antibiotic empirically  Because one sided     But if doesn't get better need to check with your PCP and or further evaluation

## 2015-09-21 NOTE — Progress Notes (Signed)
Pre visit review using our clinic review tool, if applicable. No additional management support is needed unless otherwise documented below in the visit note.   Chief Complaint  Patient presents with  . Sore Throat    left side 5 days     HPI: Patient comes in today for [facility] Saturday clinic for  new problem evaluation. Onset  4 days ago   Left side  Throat pain  Hurst to swallow and tender  Thought would go away and still there ..sweating  And ? Chills   Like not right but no fever .   Hx fo sinus surgery but no sinus infection obvious No fever .  Min nose congestion.    ROS: See pertinent positives and negatives per HPI. Dr Benjamine Mola  Past Medical History  Diagnosis Date  . Bipolar disorder ([place])   . MVP (mitral valve prolapse)   . Hyperlipidemia   . Asthma   . Allergic rhinitis   . Arthritis     R knee, Left Knee  . Urinary tract infection     frequent, see Urololgist 12/16/2010  . Dysrhythmia     Palpations occ, takes Tenormin  . Constipation   . Complication of anesthesia     had bad anxiety after anesthesia  . Heart murmur   . Anxiety   . Constipation, chronic   . GERD (gastroesophageal reflux disease)   . Depression   . Cataract     Family History  Problem Relation Age of Onset  . Coronary artery disease Father   . Heart attack Father 15  . Heart disease Father   . Hypertension Brother   . Anesthesia problems Neg Hx   . Lung disease Mother     Mycobacterium avium intracellulare    Social History   Social History  . Marital Status: Single    Spouse Name: N/A  . Number of Children: 0  . Years of Education: 13   Occupational History  . retired//disability    Social History Main Topics  . Smoking status: Never Smoker   . Smokeless tobacco: Never Used  . Alcohol Use: 0.0 oz/week    0 Standard drinks or equivalent per week     Comment: Occasional glass of wine/"hardly ever"  . Drug Use: No  . Sexual Activity: No   Other Topics Concern  . Not on file    Social History Narrative   Lives alone with 2 cats. Has pet sitting business.   2 cups of coffee a day, Rare soda     Outpatient Prescriptions Prior to Visit  Medication Sig Dispense Refill  . atenolol (TENORMIN) 50 MG tablet TAKE 1 TABLET BY MOUTH EVERY DAY 180 tablet 1  . clonazePAM (KLONOPIN) 1 MG tablet Take 1 mg by mouth 4 (four) times daily as needed for anxiety.     Marland Kitchen estrogens, conjugated, (PREMARIN) 0.625 MG tablet Take 0.625 mg by mouth daily.     . fenofibrate 160 MG tablet Take 1 tablet (160 mg total) by mouth daily. 90 tablet 1  . FLOVENT HFA 110 MCG/ACT inhaler   12  . gabapentin (NEURONTIN) 600 MG tablet Take 600 mg by mouth 3 (three) times daily.    Marland Kitchen ibuprofen (ADVIL,MOTRIN) 600 MG tablet Take 1 tablet (600 mg total) by mouth every 6 (six) hours as needed. 20 tablet 0  . lamoTRIgine (LAMICTAL) 200 MG tablet Take 200 mg by mouth 2 (two) times daily.     . meloxicam (MOBIC) 15 MG tablet  4  . methocarbamol (ROBAXIN) 750 MG tablet Take 1 tablet (750 mg total) by mouth every 8 (eight) hours as needed for muscle spasms. 30 tablet 0  . montelukast (SINGULAIR) 10 MG tablet Take 1 tablet (10 mg total) by mouth at bedtime. 30 tablet 3  . Omega-3 Fatty Acids (FISH OIL) 1000 MG CAPS Take 1 capsule by mouth daily.    Marland Kitchen omeprazole (PRILOSEC) 40 MG capsule TAKE 1 CAPSULE BY MOUTH ONCE DAILY 30 capsule 2  . QUEtiapine (SEROQUEL) 25 MG tablet Take 25 mg by mouth 3 (three) times daily.  3  . QUEtiapine (SEROQUEL) 300 MG tablet Take 300 mg by mouth at bedtime.  3  . rosuvastatin (CRESTOR) 20 MG tablet Take 1 tablet (20 mg total) by mouth daily. 30 tablet 6  . venlafaxine (EFFEXOR-XR) 75 MG 24 hr capsule Take 225 mg by mouth daily.     . pravastatin (PRAVACHOL) 40 MG tablet Take 1 tablet (40 mg total) by mouth at bedtime. 30 tablet 6  . simvastatin (ZOCOR) 40 MG tablet TAKE 1 TABLET AT BEDTIME 90 tablet 1   No facility-administered medications prior to visit.     EXAM:  BP 108/70  mmHg  Pulse 65  Temp(Src) 98.3 F (36.8 C) (Oral)  Resp 16  Ht 5\' 9"  (1.753 m)  Wt 219 lb (99.338 kg)  BMI 32.33 kg/m2  SpO2 95%  LMP 11/09/2000  Body mass index is 32.33 kg/(m^2).  GENERAL: vitals reviewed and listed above, alert, oriented, appears well hydrated and in no acute distress HEENT: atraumatic, conjunctiva  clear, no obvious abnormalities on inspection of external nose and ears tms no OP : no lesion edema or exudate  Red left more than right  No exudate  NECK: no obvious masses on inspection palpation  Tender left ac area   LUNGS: clear to auscultation bilaterally, no wheezes, rales or rhonchi,  MS: moves all extremities without noticeable focal  abnormality PSYCH: pleasant and cooperative, no obvious depression or anxiety  ASSESSMENT AND PLAN:  Discussed the following assessment and plan:  Sore throat left side  - left sided when swallowing  peristsent no fever but sweats . cx for strep.  - Plan: Culture, Group A Strep  sweats Atypical   Empiric  Therapy .   For now .  Call with cx fu if  persistent or progressive  -Patient advised to return or notify health care team  if symptoms worsen ,persist or new concerns arise.  Patient Instructions  Checking for strep    Culture  test.  Sometimes .   Other infection that is self resolving  Is possible . No alarm features .  We can add antibiotic empirically  Because one sided     But if doesn't get better need to check with your PCP and or further evaluation      Mariann Laster K. Panosh M.D.

## 2015-09-24 ENCOUNTER — Telehealth: Payer: Self-pay | Admitting: Family Medicine

## 2015-09-24 NOTE — Telephone Encounter (Signed)
Unable to perform testing.

## 2015-09-24 NOTE — Telephone Encounter (Signed)
Patient was seen at Saturday clinic by Dr. Regis Bill. A throat culture was order on this patient and [facility]  [facility] called regarding the culture stating that the swab was dry. [facility] lab can be reached at [phone] option 3 for microbiology.

## 2015-10-07 ENCOUNTER — Ambulatory Visit: Payer: Self-pay | Admitting: Neurology

## 2015-10-29 ENCOUNTER — Telehealth: Payer: Self-pay | Admitting: Family Medicine

## 2015-10-29 ENCOUNTER — Ambulatory Visit: Payer: Medicare Other | Admitting: Family Medicine

## 2015-10-29 NOTE — Telephone Encounter (Signed)
no

## 2015-10-29 NOTE — Telephone Encounter (Signed)
[name] please disregard previous message

## 2015-10-29 NOTE — Telephone Encounter (Signed)
Patient called and stated she's in [place] and will not be able to make her 2pm appointment today. Patient [facility] for tomorrow. Charge or no charge

## 2015-10-30 ENCOUNTER — Encounter: Payer: Self-pay | Admitting: Family Medicine

## 2015-10-30 ENCOUNTER — Ambulatory Visit (INDEPENDENT_AMBULATORY_CARE_PROVIDER_SITE_OTHER): Payer: Medicare Other | Admitting: Family Medicine

## 2015-10-30 VITALS — BP 124/80 | HR 67 | Temp 98.2°F | Resp 16 | Ht 69.0 in | Wt 216.2 lb

## 2015-10-30 DIAGNOSIS — M791 Myalgia: Secondary | ICD-10-CM

## 2015-10-30 DIAGNOSIS — IMO0001 Reserved for inherently not codable concepts without codable children: Secondary | ICD-10-CM | POA: Insufficient documentation

## 2015-10-30 DIAGNOSIS — M609 Myositis, unspecified: Secondary | ICD-10-CM | POA: Diagnosis not present

## 2015-10-30 NOTE — Progress Notes (Signed)
Pre visit review using our clinic review tool, if applicable. No additional management support is needed unless otherwise documented below in the visit note. 

## 2015-10-30 NOTE — Patient Instructions (Signed)
Follow up as needed We'll notify you of your lab results and make any changes if needed Drink plenty of fluids Hold the Crestor x2 weeks and let me know how you're doing Call with any questions or concerns If you want to join [place] at the new [place] office, any scheduled appointments will automatically transfer and we will see you at [address] [place] [address] [place], [place] [address] (OPENING 11/12/15) Happy Holidays!!!

## 2015-10-30 NOTE — Progress Notes (Signed)
   Subjective:    Patient ID: [name], female    DOB: 02/24/1956, 59 y.o.   MRN: [id number]  HPI Malaise- pt reports ~2 weeks of fatigue, muscle aches, leg weakness.  Some nausea.  Pt reports sxs are similar to when she had previous statin intolerance.  Pt is back on Crestor.  Pt reports sleeping well at night.  No fevers.  No cough.  Mild nasal congestion, some HA.     Review of Systems For ROS see HPI     Objective:   Physical Exam  Constitutional: She is oriented to person, place, and time. She appears well-developed and well-nourished. No distress.  HENT:  Head: Normocephalic and atraumatic.  Right Ear: Tympanic membrane normal.  Left Ear: Tympanic membrane normal.  Nose: Mucosal edema and rhinorrhea present. Right sinus exhibits no maxillary sinus tenderness and no frontal sinus tenderness. Left sinus exhibits no maxillary sinus tenderness and no frontal sinus tenderness.  Mouth/Throat: Mucous membranes are normal. Posterior oropharyngeal erythema (w/ PND) present.  Eyes: Conjunctivae and EOM are normal. Pupils are equal, round, and reactive to light.  Neck: Normal range of motion. Neck supple.  Cardiovascular: Normal rate, regular rhythm and normal heart sounds.   Pulmonary/Chest: Effort normal and breath sounds normal. No respiratory distress. She has no wheezes. She has no rales.  Lymphadenopathy:    She has no cervical adenopathy.  Neurological: She is alert and oriented to person, place, and time.  Skin: Skin is warm and dry.  Psychiatric: She has a normal mood and affect. Her behavior is normal. Thought content normal.  Vitals reviewed.         Assessment & Plan:

## 2015-10-31 LAB — CBC WITH DIFFERENTIAL/PLATELET
BASOS PCT: 0.2 % (ref 0.0–3.0)
Basophils Absolute: 0 10*3/uL (ref 0.0–0.1)
EOS PCT: 0.5 % (ref 0.0–5.0)
Eosinophils Absolute: 0 10*3/uL (ref 0.0–0.7)
HEMATOCRIT: 38.7 % (ref 36.0–46.0)
HEMOGLOBIN: 12.8 g/dL (ref 12.0–15.0)
LYMPHS PCT: 29.3 % (ref 12.0–46.0)
Lymphs Abs: 1.6 10*3/uL (ref 0.7–4.0)
MCHC: 33.2 g/dL (ref 30.0–36.0)
MCV: 90 fl (ref 78.0–100.0)
MONO ABS: 0.4 10*3/uL (ref 0.1–1.0)
Monocytes Relative: 6.6 % (ref 3.0–12.0)
Neutro Abs: 3.4 10*3/uL (ref 1.4–7.7)
Neutrophils Relative %: 63.4 % (ref 43.0–77.0)
Platelets: 230 10*3/uL (ref 150.0–400.0)
RBC: 4.3 Mil/uL (ref 3.87–5.11)
RDW: 13.7 % (ref 11.5–15.5)
WBC: 5.4 10*3/uL (ref 4.0–10.5)

## 2015-10-31 LAB — BASIC METABOLIC PANEL
BUN: 12 mg/dL (ref 6–23)
CHLORIDE: 105 meq/L (ref 96–112)
CO2: 25 meq/L (ref 19–32)
CREATININE: 0.56 mg/dL (ref 0.40–1.20)
Calcium: 9.4 mg/dL (ref 8.4–10.5)
GFR: 118.09 mL/min (ref >60.00)
Glucose, Bld: 78 mg/dL (ref 70–99)
POTASSIUM: 3.9 meq/L (ref 3.5–5.1)
SODIUM: 140 meq/L (ref 135–145)

## 2015-10-31 LAB — B12 AND FOLATE PANEL: VITAMIN B 12: 404 pg/mL (ref 211–911)

## 2015-10-31 LAB — TSH: TSH: 1.55 u[IU]/mL (ref 0.35–4.50)

## 2015-10-31 LAB — CK: Total CK: 127 U/L (ref 7–177)

## 2015-10-31 NOTE — Assessment & Plan Note (Signed)
Recurrent problem for pt.  She had similar sxs previously when taking a statin.  Check labs to r/o infxn, electrolyte disturbance, muscle breakdown.  Hold statin x2 weeks and see if sxs improve.  Reviewed supportive care and red flags that should prompt return.  Pt expressed understanding and is in agreement w/ plan.

## 2015-11-13 ENCOUNTER — Telehealth: Payer: Self-pay | Admitting: Neurology

## 2015-11-13 NOTE — Telephone Encounter (Signed)
error 

## 2015-11-14 ENCOUNTER — Ambulatory Visit: Payer: Medicare Other | Admitting: Neurology

## 2015-11-14 ENCOUNTER — Telehealth: Payer: Self-pay

## 2015-11-14 NOTE — Telephone Encounter (Signed)
Patient did not show to appt today  

## 2015-11-20 ENCOUNTER — Encounter: Payer: Self-pay | Admitting: Neurology

## 2016-01-13 ENCOUNTER — Encounter: Payer: Self-pay | Admitting: Medical

## 2016-01-13 ENCOUNTER — Ambulatory Visit (INDEPENDENT_AMBULATORY_CARE_PROVIDER_SITE_OTHER): Payer: Medicare HMO | Admitting: Medical

## 2016-01-13 VITALS — BP 120/80 | HR 78 | Temp 98.2°F | Resp 16 | Ht 69.0 in | Wt 215.0 lb

## 2016-01-13 DIAGNOSIS — K120 Recurrent oral aphthae: Secondary | ICD-10-CM

## 2016-01-13 MED ORDER — MAGIC MOUTHWASH: ORAL | Status: DC

## 2016-01-13 MED FILL — MAGIC MW LID/MAAL/DP1:1:1: 2 | 10 days supply | Qty: 200 | Fill #0

## 2016-01-13 NOTE — Progress Notes (Signed)
Subjective:    Patient ID: [name], female    DOB: 1956-01-28, 60 y.o.   MRN: [id number]  HPI  Pt in with canker sore in her mouth. Pt states never had before. She states stressed out with new move. Pt tried otc medicine. It burned a lot. Also tried cold gate otc product.   Pt states this started last wed.      Review of Systems  Constitutional: Negative for fever, chills and fatigue.  HENT: Negative for congestion, ear pain and rhinorrhea.        Canker sore in her mouth per pt.  Respiratory: Negative for cough, choking, chest tightness and wheezing.   Cardiovascular: Negative for chest pain and palpitations.  Musculoskeletal: Negative for back pain.  Neurological: Negative for dizziness and headaches.  Hematological: Negative for adenopathy. Does not bruise/bleed easily.   Past Medical History  Diagnosis Date  . Bipolar disorder ([place])   . MVP (mitral valve prolapse)   . Hyperlipidemia   . Asthma   . Allergic rhinitis   . Arthritis     R knee, Left Knee  . Urinary tract infection     frequent, see Urololgist 12/16/2010  . Dysrhythmia     Palpations occ, takes Tenormin  . Constipation   . Complication of anesthesia     had bad anxiety after anesthesia  . Heart murmur   . Anxiety   . Constipation, chronic   . GERD (gastroesophageal reflux disease)   . Depression   . Cataract     Social History   Social History  . Marital Status: Single    Spouse Name: N/A  . Number of Children: 0  . Years of Education: 13   Occupational History  . retired//disability    Social History Main Topics  . Smoking status: Never Smoker   . Smokeless tobacco: Never Used  . Alcohol Use: 0.0 oz/week    0 Standard drinks or equivalent per week     Comment: Occasional glass of wine/"hardly ever"  . Drug Use: No  . Sexual Activity: No   Other Topics Concern  . Not on file   Social History Narrative   Lives alone with 2 cats. Has pet sitting business.   2 cups of coffee a  day, Rare soda     Past Surgical History  Procedure Laterality Date  . Nasal sinus surgery  01/2010    x2  . Back surgery  2001    L5 - S1  . Abdominal hysterectomy  2002    no oophorectomy  . Tonsillectomy    . Total knee arthroplasty  12/23/2011    Procedure: TOTAL KNEE ARTHROPLASTY;  Surgeon: Ninetta Lights, MD;  Location: [facility];  Service: Orthopedics;  Laterality: Right;  DR MURPHY WANTS 120 MINUTES FOR SURGERY  . Total knee arthroplasty  08/31/2012    left knee  . Total knee arthroplasty  08/31/2012    Procedure: TOTAL KNEE ARTHROPLASTY;  Surgeon: Ninetta Lights, MD;  Location: [facility];  Service: Orthopedics;  Laterality: Left;    Family History  Problem Relation Age of Onset  . Coronary artery disease Father   . Heart attack Father 89  . Heart disease Father   . Hypertension Brother   . Anesthesia problems Neg Hx   . Lung disease Mother     Mycobacterium avium intracellulare    Allergies  Allergen Reactions  . Nitrofurantoin Monohyd Macro Nausea Only    Severe headache  .  Sulfa Antibiotics Nausea And Vomiting  . Alprazolam Anxiety and Other (See Comments)    Hyper   . Amoxicillin Rash  . Prednisone Anxiety    Current Outpatient Prescriptions on File Prior to Visit  Medication Sig Dispense Refill  . atenolol (TENORMIN) 50 MG tablet TAKE 1 TABLET BY MOUTH EVERY DAY 180 tablet 1  . clindamycin (CLEOCIN) 300 MG capsule   0  . clonazePAM (KLONOPIN) 2 MG tablet   0  . estrogens, conjugated, (PREMARIN) 0.625 MG tablet Take 0.625 mg by mouth daily.     . fenofibrate 160 MG tablet Take 1 tablet (160 mg total) by mouth daily. 90 tablet 1  . gabapentin (NEURONTIN) 600 MG tablet Take 600 mg by mouth 3 (three) times daily.    Marland Kitchen ibuprofen (ADVIL,MOTRIN) 600 MG tablet Take 1 tablet (600 mg total) by mouth every 6 (six) hours as needed. 20 tablet 0  . lamoTRIgine (LAMICTAL) 200 MG tablet Take 200 mg by mouth 2 (two) times daily.     . montelukast (SINGULAIR) 10 MG tablet Take  1 tablet (10 mg total) by mouth at bedtime. 30 tablet 3  . Omega-3 Fatty Acids (FISH OIL) 1000 MG CAPS Take 1 capsule by mouth daily.    Marland Kitchen omeprazole (PRILOSEC) 40 MG capsule TAKE 1 CAPSULE BY MOUTH ONCE DAILY 30 capsule 2  . QUEtiapine (SEROQUEL) 25 MG tablet Take 25 mg by mouth 3 (three) times daily.  3  . QUEtiapine (SEROQUEL) 300 MG tablet Take 300 mg by mouth at bedtime.  3  . rosuvastatin (CRESTOR) 20 MG tablet Take 1 tablet (20 mg total) by mouth daily. 30 tablet 6  . venlafaxine (EFFEXOR-XR) 75 MG 24 hr capsule Take 225 mg by mouth daily.      No current facility-administered medications on file prior to visit.    BP 120/80 mmHg  Pulse 78  Temp(Src) 98.2 F (36.8 C) (Oral)  Resp 16  Ht 5\' 9"  (1.753 m)  Wt 215 lb (97.523 kg)  BMI 31.74 kg/m2  SpO2 98%  LMP 11/09/2000       Objective:   Physical Exam  General  Mental Status - Alert. General Appearance - Well groomed. Not in acute distress.  Skin Rashes- No Rashes.  HEENT Head- Normal. Ear Auditory Canal - Left- Normal. Right - Normal.Tympanic Membrane- Left- Normal. Right- Normal. Eye Sclera/Conjunctiva- Left- Normal. Right- Normal. Nose & Sinuses Nasal Mucosa- Left-  Not boggy or Congested. Right-  Not  boggy or Congested. Mouth & Throat Lips: Upper Lip- Normal: no dryness, cracking, pallor, cyanosis, or vesicular eruption. Lower Lip-Normal: no dryness, cracking, pallor, cyanosis or vesicular eruption. Buccal Mucosa- Bilateral-  Aphthous ulcers inside lower lip lt side. Mild tender under tongue as well.  Oropharynx- No Discharge or Erythema. Tonsils: Characteristics- Bilateral- No Erythema or Congestion. Size/Enlargement- Bilateral- No enlargement. Discharge- bilateral-None.  Neck Neck- Supple. No Masses.   Chest and Lung Exam Auscultation: Breath Sounds:- even and unlabored  Cardiovascular Auscultation:Rythm- Regular, rate and rhythm. Murmurs & Other Heart Sounds:Ausculatation of the heart reveal- No  Murmurs.  Lymphatic Head & Neck General Head & Neck Lymphatics: Bilateral: Description- No Localized lymphadenopathy.       Assessment & Plan:  For the area inside lower lip and beneath tongue will rx magic mouthwash. This should help as your body heals itself. Avoid any salty, spicy or crunchy foods. Eat bland foods, baked potato no salt, and maybe eat jello.  If area persists then let us know and will  refer to ENT. You have had these already 5 days which is typically upper limits and should now resolve.

## 2016-01-13 NOTE — Patient Instructions (Addendum)
For the area inside lower lip and beneath tongue will rx magic mouthwash. This should help as your body heals itself. Avoid any salty, spicy or crunchy foods. Eat bland foods, baked potato no salt, and maybe eat jello.  If area persists then let us know and will refer to ENT. You have had these already 5 days which is typically upper limits and should now resolve.  Follow up in 7 days or as needed

## 2016-01-13 NOTE — Progress Notes (Signed)
Pre visit review using our clinic review tool, if applicable. No additional management support is needed unless otherwise documented below in the visit note. 

## 2016-02-06 ENCOUNTER — Telehealth: Payer: Self-pay

## 2016-02-06 NOTE — Telephone Encounter (Signed)
I spoke to patient and reminded her to bring in memory card for CPAP machine in with her on appt.

## 2016-02-10 ENCOUNTER — Ambulatory Visit (INDEPENDENT_AMBULATORY_CARE_PROVIDER_SITE_OTHER): Payer: Medicare HMO | Admitting: Neurology

## 2016-02-10 ENCOUNTER — Encounter: Payer: Self-pay | Admitting: Neurology

## 2016-02-10 VITALS — BP 110/64 | HR 80 | Resp 18 | Ht 69.0 in | Wt 215.0 lb

## 2016-02-10 DIAGNOSIS — G4733 Obstructive sleep apnea (adult) (pediatric): Secondary | ICD-10-CM

## 2016-02-10 DIAGNOSIS — E669 Obesity, unspecified: Secondary | ICD-10-CM | POA: Diagnosis not present

## 2016-02-10 NOTE — Patient Instructions (Addendum)
Please continue using your CPAP regularly. While your insurance requires that you use CPAP at least 4 hours each night on 70% of the nights, I recommend, that you not skip any nights and use it throughout the night if you can. Getting used to CPAP and staying with the treatment long term does take time and patience and discipline. Untreated obstructive sleep apnea when it is moderate to severe can have an adverse impact on cardiovascular health and raise her risk for heart disease, arrhythmias, hypertension, congestive heart failure, stroke and diabetes. Untreated obstructive sleep apnea causes sleep disruption, nonrestorative sleep, and sleep deprivation. This can have an impact on your day to day functioning and cause daytime sleepiness and impairment of cognitive function, memory loss, mood disturbance, and problems focussing. Using CPAP regularly can improve these symptoms.  For your sleep apnea, let's do a follow up in 1 year.  I will try to expedite your supplies.

## 2016-02-10 NOTE — Progress Notes (Signed)
Subjective:    Patient ID: [name] is a 60 y.o. female.  HPI     Interim history:  [name] is a 59 year old right-handed woman with an underlying medical history of bipolar disorder, obesity, mitral valve prolapse, hyperlipidemia, allergic rhinitis, palpitations, constipation, heart murmur, chronic cough, reflux disease, anxiety, and low back pain status post back surgery in 2001, arthritis, status post left total knee arthroplasty and right total knee arthroplasty, tonsillectomy, and hysterectomy, and status post nasal surgeries twice, who presents for follow-up consultation of her obstructive sleep apnea, established on CPAP therapy at home. The patient is unaccompanied today. Of note, the patient no showed for an appointment on 11/14/2015 due to a family emergency, and she also had to cancel an appointment for 10/07/2015. I last saw her on 03/27/2015, at which time she reported feeling better. She was feeling better rested. She did have more stress and was worrying about her brother. She was motivated to try to lose weight and exercise more. Overall, she felt that she was doing better and her morning headaches had also improved as well as her nocturia. She was compliant with treatment.  Today, 02/10/2016: I reviewed her CPAP compliance data from 12/13/2015 through 01/11/2016 which is a total of 30 days during which time she used her machine 17 days with percent used days greater than 4 hours at 53%, indicating suboptimal compliance with an average usage of 4 hours and 10 minutes only for all days. Residual AHI 1.9 per hour, leak acceptable with the 95th percentile at 15.2 L/m on a pressure of 11 cm with EPR of 3.  I reviewed her CPAP compliance data from 10/13/2015 through 11/11/2015 which is a total of 30 days during which time she used her machine 21 days with percent used days greater than 4 hours at 63%, indicating suboptimal compliance with an average usage of 4 hours and 36 minutes,  residual AHI 3 per hour, leak acceptable with the 95th percentile at 8.5 L/m on a pressure of 11 cm with EPR of 3.  I reviewed her CPAP compliance data from 09/01/2015 through 09/30/2015 which is a total of 30 days during which time she used her machine 23 days with percent used days greater than 4 hours at 67% only, indicating suboptimal compliance with an average usage of 4 hours and 55 minutes, residual AHI borderline at 5.1 per hour, leak acceptable with the 95th percentile at 10.4 L/m on a pressure of 11 cm with EPR of 3.    Today, 02/10/2016: She reports that she needs supplies. She moved to a smaller townhome recently and lost her mask and other parts of her headgear. She noticed worsening of her daytime somnolence, mood irritability and memory issues since stopping CPAP for the past month or so. She is motivated to get back on treatment. She admits that she thought she would do okay without treatment. Weight has been fairly stable.   Previously:   I first met her on 09/13/2014 at the request of her primary care physician. At which time the patient reported excessive daytime somnolence and snoring. I invited her back for sleep study. She had a baseline sleep study, followed by a CPAP titration study and I went over her test results with her in detail today. Her baseline sleep study from 11/27/2014 showed a sleep efficiency of 77.7%. Sleep latency was prolonged. Wake after sleep onset was 39.5 minutes with mild to moderate sleep fragmentation. She had an increased percentage of stage I  and stage II sleep, absence of slow-wave sleep and absence of REM sleep. She had moderate to loud snoring. Total AHI was 5.8 per hour, rising to 15.8 per hour in the supine position. Average oxygen saturation was only 90%. Nadir was 77%. Based on her significant oxygen desaturations I invited her back for CPAP titration study. Of note, the absence of REM sleep with most likely have underestimated her obstructive sleep  apnea. Her CPAP titration study from 01/08/2015 showed a sleep efficiency of 93.6%, latency to sleep was 0 minutes and wake after sleep onset was 33.5 minutes with mild sleep fragmentation noted. She had a normal arousal index. She had a absence of slow-wave sleep and a normal percentage of REM sleep with a long REM latency. She had no significant PLMS. Average oxygen saturation was 92%, nadir was 84%. She did have evidence of central apneas upon starting with CPAP. CPAP was titrated from 5-11 cm. I prescribed CPAP therapy for home use. I did suggest that she should have a pulse oximetry test while on CPAP once established on treatment.  I reviewed her CPAP compliance data from 02/23/2015 through 03/24/2015 which is a total of 30 days during which time she used her machine 25 days with percent used days greater than 4 hours at 77%, indicating good compliance with an average usage of 6 hours and 11 minutes. Residual AHI acceptable at 3.3 per hour and 95th percentile of leak at 12 L/m on a pressure of 11 cm with EPR of 3.  I reviewed her CPAP compliance data from 02/12/2015 through 03/13/2015 which is a total of 30 days during which time she used her machine only 22 days with percent used days greater than 4 hours at 73%, indicating adequate compliance with an average usage of 5 hours and 41 minutes. Residual AHI acceptable at 3.4 per hour and 95th percentile of leak at 14.7 L/m on a pressure of 11 cm with EPR of 3.  She goes to [facility] for her mood d/o. She is on multiple sedating medications, but has been told she snores. She has snored for 4 years or so. She does not wake up rested. She wakes up with a headache. She denies nocturia. She has no FHx of OSA. She has had intermittent RLS symptoms and twitches her legs in her sleep. She has dozed off driving more than once and veered out of the lane, but thankfully has had no MVA.   She drinks 2 cups of coffee per day. She does not smoke  and very rarely drinks alcohol.   She has 2 cats, who occasionally are in the bed, but don't bother her. She does not watch TV in bed. She mostly sleeps alone. She is divorced and has no children.   Her typical bedtime is reported to be around 10 PM and usual wake time is around 7 AM. Sleep onset typically occurs within minutes. She reports feeling poorly rested upon awakening. She wakes up on an average 3 times in the middle of the night and has to go to the bathroom 0 to 1 times on a typical night.    She reports excessive daytime somnolence (EDS) and Her Epworth Sleepiness Score (ESS) is 9/24 today.   The patient has not been taking a planned nap.   Snoring is reportedly marked, and associated with choking sounds and witnessed apneas. The patient denies a sense of choking or strangling feeling. The patient has noted any RLS symptoms and is known  to kick while asleep or before falling asleep. There is no family history of RLS or OSA.   She is a restless sleeper and in the morning, the bed is quite disheveled.    She denies cataplexy, sleep paralysis, hypnagogic or hypnopompic hallucinations, or sleep attacks. She does not report any vivid dreams, nightmares, dream enactments, or parasomnias, such as sleep talking or sleep walking. The patient has not had a sleep study or a home sleep test. Her bedroom is usually dark and cool. She uses a box fan.     Her Past Medical History Is Significant For: Past Medical History  Diagnosis Date  . Bipolar disorder ([place])   . MVP (mitral valve prolapse)   . Hyperlipidemia   . Asthma   . Allergic rhinitis   . Arthritis     R knee, Left Knee  . Urinary tract infection     frequent, see Urololgist 12/16/2010  . Dysrhythmia     Palpations occ, takes Tenormin  . Constipation   . Complication of anesthesia     had bad anxiety after anesthesia  . Heart murmur   . Anxiety   . Constipation, chronic   . GERD (gastroesophageal reflux disease)   . Depression    . Cataract     Her Past Surgical History Is Significant For: Past Surgical History  Procedure Laterality Date  . Nasal sinus surgery  01/2010    x2  . Back surgery  2001    L5 - S1  . Abdominal hysterectomy  2002    no oophorectomy  . Tonsillectomy    . Total knee arthroplasty  12/23/2011    Procedure: TOTAL KNEE ARTHROPLASTY;  Surgeon: Ninetta Lights, MD;  Location: [facility];  Service: Orthopedics;  Laterality: Right;  DR MURPHY WANTS 120 MINUTES FOR SURGERY  . Total knee arthroplasty  08/31/2012    left knee  . Total knee arthroplasty  08/31/2012    Procedure: TOTAL KNEE ARTHROPLASTY;  Surgeon: Ninetta Lights, MD;  Location: [facility];  Service: Orthopedics;  Laterality: Left;    Her Family History Is Significant For: Family History  Problem Relation Age of Onset  . Coronary artery disease Father   . Heart attack Father 81  . Heart disease Father   . Hypertension Brother   . Anesthesia problems Neg Hx   . Lung disease Mother     Mycobacterium avium intracellulare    Her Social History Is Significant For: Social History   Social History  . Marital Status: Single    Spouse Name: N/A  . Number of Children: 0  . Years of Education: 13   Occupational History  . retired//disability    Social History Main Topics  . Smoking status: Never Smoker   . Smokeless tobacco: Never Used  . Alcohol Use: 0.0 oz/week    0 Standard drinks or equivalent per week     Comment: Occasional glass of wine/"hardly ever"  . Drug Use: No  . Sexual Activity: No   Other Topics Concern  . 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   2 cups of coffee a day, Rare soda     Her Allergies Are:  Allergies  Allergen Reactions  . Nitrofurantoin Monohyd Macro Nausea Only    Severe headache  . Sulfa Antibiotics Nausea And Vomiting  . Alprazolam Anxiety and Other (See Comments)    Hyper   . Amoxicillin Rash  . Prednisone  Anxiety  :   Her Current  Medications Are:  Outpatient Encounter Prescriptions as of 02/10/2016  Medication Sig  . atenolol (TENORMIN) 50 MG tablet TAKE 1 TABLET BY MOUTH EVERY DAY  . clonazePAM (KLONOPIN) 2 MG tablet   . estrogens, conjugated, (PREMARIN) 0.625 MG tablet Take 0.625 mg by mouth daily.   . fenofibrate 160 MG tablet Take 1 tablet (160 mg total) by mouth daily.  Marland Kitchen gabapentin (NEURONTIN) 600 MG tablet Take 600 mg by mouth 3 (three) times daily.  Marland Kitchen lamoTRIgine (LAMICTAL) 200 MG tablet Take 200 mg by mouth 2 (two) times daily.   . magic mouthwash SOLN 5 ml po qid swish and spit.  . montelukast (SINGULAIR) 10 MG tablet Take 1 tablet (10 mg total) by mouth at bedtime.  . Omega-3 Fatty Acids (FISH OIL) 1000 MG CAPS Take 1 capsule by mouth daily.  Marland Kitchen omeprazole (PRILOSEC) 40 MG capsule TAKE 1 CAPSULE BY MOUTH ONCE DAILY  . QUEtiapine (SEROQUEL) 25 MG tablet Take 25 mg by mouth 3 (three) times daily.  . QUEtiapine (SEROQUEL) 300 MG tablet Take 300 mg by mouth at bedtime.  Marland Kitchen venlafaxine (EFFEXOR-XR) 75 MG 24 hr capsule Take 225 mg by mouth daily.   . [DISCONTINUED] clindamycin (CLEOCIN) 300 MG capsule   . [DISCONTINUED] ibuprofen (ADVIL,MOTRIN) 600 MG tablet Take 1 tablet (600 mg total) by mouth every 6 (six) hours as needed.  . [DISCONTINUED] rosuvastatin (CRESTOR) 20 MG tablet Take 1 tablet (20 mg total) by mouth daily.   No facility-administered encounter medications on file as of 02/10/2016.  :  Review of Systems:  Out of a complete 14 point review of systems, all are reviewed and negative with the exception of these symptoms as listed below:  Review of Systems  Neurological:       Patient reports that she has done well with CPAP. She just moved to another house and lost some of her CPAP supplies during the move. Needs new order for supplies.     Objective:  Neurologic Exam  Physical Exam Physical Examination:   Filed Vitals:   02/10/16 1616  BP: 110/64  Pulse: 80  Resp: 18   General Examination:  The patient is a very pleasant 60 yo female in no acute distress. She appears well-developed and well-nourished and well groomed. She is in good spirits today.  HEENT: Normocephalic, atraumatic, pupils are equal, round and reactive to light and accommodation. She has mild bilateral cataracts. Extraocular tracking is good without limitation to gaze excursion or nystagmus noted. Normal smooth pursuit is noted. Hearing is grossly intact. Face is symmetric with normal facial animation and normal facial sensation. Speech is clear with no dysarthria noted. There is no hypophonia. There is no lip, neck/head, jaw or voice tremor. Neck is supple with full range of passive and active motion. There are no carotid bruits on auscultation. Oropharynx exam reveals: mild mouth dryness, adequate dental hygiene and mild airway crowding, due to narrow airway entry and redundant soft palate. Mallampati is class II. Tongue protrudes centrally and palate elevates symmetrically. Tonsils are absent. She has a Absent overbite. Nasal inspection reveals no significant nasal mucosal bogginess or redness and no septal deviation.   Chest: Clear to auscultation without wheezing, rhonchi or crackles noted.  Heart: S1+S2+0, regular and normal without murmurs, rubs or gallops noted.   Abdomen: Soft, non-tender and non-distended with normal bowel sounds appreciated on auscultation.  Extremities: There is no pitting edema in the distal lower extremities bilaterally, ankles appear  puffy. Pedal pulses are intact.  Skin: Warm and dry without trophic changes noted. There are no varicose veins.  Musculoskeletal: exam reveals no obvious joint deformities, tenderness or joint swelling or erythema.   Neurologically:  Mental status: The patient is awake, alert and oriented in all 4 spheres. Her immediate and remote memory, attention, language skills and fund of knowledge are appropriate. There is no evidence of aphasia, agnosia, apraxia or  anomia. Speech is clear with normal prosody and enunciation. Thought process is linear. Mood is normal and affect is normal.  Cranial nerves II - XII are as described above under HEENT exam. In addition: shoulder shrug is normal with equal shoulder height noted. Motor exam: Normal bulk, strength and tone is noted. There is no drift, tremor or rebound. Romberg is negative. Reflexes are 1-2+ throughout. Babinski: Toes are flexor bilaterally. Fine motor skills and coordination: intact with normal finger taps, normal hand movements, normal rapid alternating patting, normal foot taps and normal foot agility.  Cerebellar testing: No dysmetria or intention tremor on finger to nose testing. Heel to shin is unremarkable bilaterally. There is no truncal or gait ataxia.  Sensory exam: intact to light touch, vibration, temperature sense in the upper and lower extremities.  Gait, station and balance: She stands easily. No veering to one side is noted. No leaning to one side is noted. Posture is age-appropriate and stance is narrow based. Gait shows normal stride length and normal pace. No problems turning are noted. She turns en bloc. Tandem walk is unremarkable.  Assessment and Plan:   In summary, Denyse Fretwell is a very pleasant 60 year old female with an underlying medical history of bipolar disorder, obesity, mitral valve prolapse, hyperlipidemia, allergic rhinitis, palpitations, constipation, heart murmur, chronic cough, reflux disease, anxiety, and low back pain status post back surgery in 2001, arthritis, status post left total knee arthroplasty and right total knee arthroplasty, tonsillectomy, and hysterectomy, status post nasal surgeries twice, who presents for follow-up consultation of her obstructive sleep apnea. We briefly talked about her baseline sleep study from January and her CPAP titration study from March 2016 again today. She was compliant with treatment up until about a month ago when she lost some of  her CPAP supplies. She has noticed an increase in her mood irritability, memory function, daytime somnolence and sleep quality has decreased as well. She had noticed improvement in all of these measures when she was on treatment. She is motivated to get back on treatment. I will try to expedite her CPAP supplies. Since she has previously been compliant with treatment and has no issues using CPAP, I suggested a one-year checkup. Physical exam and neurological exam are stable which is reassuring, blood pressure looks good. I answered all her questions today and the patient was in agreement. I encouraged her to call with any interim questions, concerns, problems or updates.  I spent 20 minutes in total face-to-face time with the patient, more than 50% of which was spent in counseling and coordination of care, reviewing test results, reviewing medication and discussing or reviewing the diagnosis of OSA, its prognosis and treatment options.

## 2016-02-17 ENCOUNTER — Telehealth: Payer: Self-pay | Admitting: Family Medicine

## 2016-02-17 MED ORDER — BECLOMETHASONE DIPROPIONATE 80 MCG/ACT IN AERS: 2.0000 | INHALATION_SPRAY | Freq: Two times a day (BID) | RESPIRATORY_TRACT | Status: DC

## 2016-02-17 NOTE — Telephone Encounter (Signed)
Can be reached: [phone] Pharmacy: [id number] - [place], [place]  Reason for call: Pt called for refill on Qvar. She changed to Flovent bc the Qvar was too expensive. She said she'll pay for it bc it works better.

## 2016-02-17 NOTE — Telephone Encounter (Signed)
Medication filled to pharmacy as requested.   

## 2016-02-18 DIAGNOSIS — J019 Acute sinusitis, unspecified: Secondary | ICD-10-CM | POA: Diagnosis not present

## 2016-02-18 DIAGNOSIS — B9689 Other specified bacterial agents as the cause of diseases classified elsewhere: Secondary | ICD-10-CM | POA: Diagnosis not present

## 2016-03-11 DIAGNOSIS — Z23 Encounter for immunization: Secondary | ICD-10-CM | POA: Diagnosis not present

## 2016-06-30 ENCOUNTER — Other Ambulatory Visit: Payer: Self-pay | Admitting: Family Medicine

## 2016-07-11 ENCOUNTER — Ambulatory Visit (INDEPENDENT_AMBULATORY_CARE_PROVIDER_SITE_OTHER): Payer: Medicare HMO | Admitting: Family Medicine

## 2016-07-11 ENCOUNTER — Encounter: Payer: Self-pay | Admitting: Family Medicine

## 2016-07-11 DIAGNOSIS — J0191 Acute recurrent sinusitis, unspecified: Secondary | ICD-10-CM

## 2016-07-11 MED ORDER — DOXYCYCLINE HYCLATE 100 MG PO TABS: 100.0000 mg | ORAL_TABLET | Freq: Two times a day (BID) | ORAL | 0 refills | Status: DC

## 2016-07-11 NOTE — Patient Instructions (Signed)
Take the doxycycline as prescribed.  Please follow-up closely with your primary.  Take care  Dr. Lacinda Axon

## 2016-07-11 NOTE — Assessment & Plan Note (Signed)
History of recurrent sinusitis, acute exacerbation. Treating with doxycycline.

## 2016-07-11 NOTE — Progress Notes (Signed)
Pre visit review using our clinic review tool, if applicable. No additional management support is needed unless otherwise documented below in the visit note. 

## 2016-07-11 NOTE — Progress Notes (Signed)
Subjective:  Patient ID: [name], female    DOB: 1956-09-04  Age: 60 y.o. MRN: [id number]  CC: Sinus infection  HPI:  60 year old female with a history of chronic cough and recurrent sinusitis presents with complaints of sinusitis.  Patient states that she's not been feeling well for the past week. She's had cough, sinus congestion, feeling hot, and feeling weak. No associated fever. She's used over-the-counter medication without second improvement. She has a history of recurrent sinusitis. No known exacerbating factors. No other complaints this time.  Social Hx   Social History   Social History  . Marital status: Single    Spouse name: N/A  . Number of children: 0  . Years of education: 21   Occupational History  . retired//disability    Social History Main Topics  . Smoking status: Never Smoker  . Smokeless tobacco: Never Used  . Alcohol use 0.0 oz/week     Comment: Occasional glass of wine/"hardly ever"  . Drug use: No  . Sexual activity: No   Other Topics Concern  . 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   2 cups of coffee a day, Rare soda    Review of Systems  Constitutional: Positive for fatigue.  HENT: Positive for sinus pressure.   Respiratory: Positive for cough.    Objective:  BP 130/80 (BP Location: Left Arm, Patient Position: Sitting, Cuff Size: Large)   Pulse 74   Temp 98.1 F (36.7 C) (Oral)   Resp 20   Ht 5\' 9"  (1.753 m)   Wt 214 lb (97.1 kg)   LMP 11/09/2000   SpO2 98%   BMI 31.60 kg/m   BP/Weight 07/11/2016 [id number]  Systolic BP [id number]  Diastolic BP 80 64 80  Wt. (Lbs) 214 215 215  BMI 31.6 31.74 31.74   Physical Exam  Constitutional: She is oriented to person, place, and time. She appears well-developed. No distress.  HENT:  Mouth/Throat: Oropharynx is clear and moist.  Mild maxillary sinus tenderness to palpation. Normal TMs bilaterally.  Cardiovascular: Normal rate  and regular rhythm.   Pulmonary/Chest: Effort normal. She has no wheezes. She has no rales.  Neurological: She is alert and oriented to person, place, and time.  Vitals reviewed.   Lab Results  Component Value Date   WBC 5.4 10/30/2015   HGB 12.8 10/30/2015   HCT 38.7 10/30/2015   PLT 230.0 10/30/2015   GLUCOSE 78 10/30/2015   CHOL 312 (H) 06/19/2015   TRIG 657 (H) 06/19/2015   HDL 44 06/19/2015   LDLDIRECT 144.4 08/04/2013   [place] Comment 06/19/2015   ALT 17 06/19/2015   AST 12 06/19/2015   NA 140 10/30/2015   K 3.9 10/30/2015   CL 105 10/30/2015   CREATININE 0.56 10/30/2015   BUN 12 10/30/2015   CO2 25 10/30/2015   TSH 1.55 10/30/2015   INR 1.18 09/02/2012   HGBA1C 6.2 05/09/2015    Assessment & Plan:   Problem List Items Addressed This Visit    Recurrent acute sinusitis    History of recurrent sinusitis, acute exacerbation. Treating with doxycycline.      Relevant Medications   doxycycline (VIBRA-TABS) 100 MG tablet    Other Visit Diagnoses   None.     Meds ordered this encounter  Medications  . doxycycline (VIBRA-TABS) 100 MG tablet    Sig: Take 1 tablet (100 mg total) by mouth 2 (two) times  daily.    Dispense:  14 tablet    Refill:  0    Follow-up: PRN  [facility]

## 2016-07-28 ENCOUNTER — Telehealth: Payer: Self-pay | Admitting: Emergency Medicine

## 2016-07-28 MED ORDER — BECLOMETHASONE DIPROPIONATE 80 MCG/ACT IN AERS
2.0000 | INHALATION_SPRAY | Freq: Two times a day (BID) | RESPIRATORY_TRACT | 0 refills | Status: DC
Start: 2016-07-28 — End: 2017-01-04

## 2016-07-28 NOTE — Telephone Encounter (Signed)
Patient calling requesting a refill of the Qvar inhaler.  Last visit 10/30/15 Please advise if ok to fill

## 2016-07-28 NOTE — Telephone Encounter (Signed)
Medication filled to pharmacy as requested.   

## 2016-07-30 ENCOUNTER — Encounter: Payer: Medicare Other | Admitting: Obstetrics & Gynecology

## 2016-08-08 ENCOUNTER — Ambulatory Visit (INDEPENDENT_AMBULATORY_CARE_PROVIDER_SITE_OTHER): Payer: Medicare HMO | Admitting: Family Medicine

## 2016-08-08 ENCOUNTER — Encounter: Payer: Self-pay | Admitting: Family Medicine

## 2016-08-08 VITALS — BP 126/80 | HR 69 | Temp 98.6°F | Resp 16 | Wt 220.0 lb

## 2016-08-08 DIAGNOSIS — J208 Acute bronchitis due to other specified organisms: Secondary | ICD-10-CM

## 2016-08-08 DIAGNOSIS — J4540 Moderate persistent asthma, uncomplicated: Secondary | ICD-10-CM | POA: Diagnosis not present

## 2016-08-08 MED ORDER — DOXYCYCLINE HYCLATE 100 MG PO TABS: 100.0000 mg | ORAL_TABLET | Freq: Two times a day (BID) | ORAL | 0 refills | Status: DC

## 2016-08-08 MED ORDER — ALBUTEROL SULFATE HFA 108 (90 BASE) MCG/ACT IN AERS: 2.0000 | INHALATION_SPRAY | Freq: Four times a day (QID) | RESPIRATORY_TRACT | 0 refills | Status: DC | PRN

## 2016-08-08 NOTE — Progress Notes (Signed)
Dr. Frederico Hamman T. Curlie Sittner, MD, [facility] [place], [address] Phone: [phone] Fax: [phone]  08/08/2016  Patient: [name], MRN: [id number], DOB: 1956-01-28, 60 y.o.  Primary Physician:  Annye Asa, MD   Chief Complaint  Patient presents with  . Cough    non productive cough, drainage, nasal and chest congestion, fatigue, sweating.    Subjective:   [name] is a 60 y.o. very pleasant female patient who presents with the following:  Cough and sweating and does not feel well. Feels unwell pretty much all over, drainage, cough and weakness.  Woke up at 5 AM and pouring sweat. Coughing and got up in the shower.   Tired of feeling bad - over a week.  Has some asthma, maybe a little wheezing.   Past Medical History, Surgical History, Social History, Family History, Problem List, Medications, and Allergies have been reviewed and updated if relevant.  Patient Active Problem List   Diagnosis Date Noted  . Herniated nucleus pulposus, L5-S1 06/22/2014  . Chronic cough 02/22/2014  . Asthma, moderate persistent 10/09/2013  . Obesity (BMI 30-39.9) 10/09/2013  . Recurrent acute sinusitis 07/15/2013  . Fatigue 12/12/2012  . GERD (gastroesophageal reflux disease) 09/02/2012  . VITAMIN D DEFICIENCY 09/15/2010  . DEPRESSIVE DISORDER 07/17/2010  . RHINITIS 06/14/2009  . MUSCLE WEAKNESS (GENERALIZED) 01/03/2009  . Hyperlipidemia 10/10/2008  . BIPOLAR AFFECTIVE DISORDER 10/10/2008  . ADD 10/10/2008    Past Medical History:  Diagnosis Date  . Allergic rhinitis   . Anxiety   . Arthritis    R knee, Left Knee  . Asthma   . Bipolar disorder ([place])   . Cataract   . Complication of anesthesia    had bad anxiety after anesthesia  . Constipation   . Constipation, chronic   . Depression   . Dysrhythmia    Palpations occ, takes Tenormin  . GERD (gastroesophageal reflux disease)   . Heart murmur   .  Hyperlipidemia   . MVP (mitral valve prolapse)   . Urinary tract infection    frequent, see Urololgist 12/16/2010    Past Surgical History:  Procedure Laterality Date  . ABDOMINAL HYSTERECTOMY  2002   no oophorectomy  . BACK SURGERY  2001   L5 - S1  . NASAL SINUS SURGERY  01/2010   x2  . TONSILLECTOMY    . TOTAL KNEE ARTHROPLASTY  12/23/2011   Procedure: TOTAL KNEE ARTHROPLASTY;  Surgeon: Ninetta Lights, MD;  Location: [facility];  Service: Orthopedics;  Laterality: Right;  DR MURPHY WANTS 120 MINUTES FOR SURGERY  . TOTAL KNEE ARTHROPLASTY  08/31/2012   left knee  . TOTAL KNEE ARTHROPLASTY  08/31/2012   Procedure: TOTAL KNEE ARTHROPLASTY;  Surgeon: Ninetta Lights, MD;  Location: [facility];  Service: Orthopedics;  Laterality: Left;    Social History   Social History  . Marital status: Single    Spouse name: N/A  . Number of children: 0  . Years of education: 33   Occupational History  . retired//disability    Social History Main Topics  . Smoking status: Never Smoker  . Smokeless tobacco: Never Used  . Alcohol use 0.0 oz/week     Comment: Occasional glass of wine/"hardly ever"  . Drug use: No  . Sexual activity: No   Other Topics Concern  . Not on file   Social History Narrative   Lives alone with 2 cats. Has pet sitting  business.   2 cups of coffee a day, Rare soda     Family History  Problem Relation Age of Onset  . Coronary artery disease Father   . Heart attack Father 3  . Heart disease Father   . Hypertension Brother   . Anesthesia problems Neg Hx   . Lung disease Mother     Mycobacterium avium intracellulare    Allergies  Allergen Reactions  . Nitrofurantoin Monohyd Macro Nausea Only    Severe headache  . Sulfa Antibiotics Nausea And Vomiting  . Alprazolam Anxiety and Other (See Comments)    Hyper   . Amoxicillin Rash  . Prednisone Anxiety    Medication list reviewed and updated in full in [facility].  ROS: GEN: Acute illness details  above GI: Tolerating PO intake GU: maintaining adequate hydration and urination Pulm: No SOB Interactive and getting along well at home.  Otherwise, ROS is as per the HPI.  Objective:   BP 126/80   Pulse 69   Temp 98.6 F (37 C) (Oral)   Resp 16   Wt 220 lb (99.8 kg)   LMP 11/09/2000   SpO2 98%   BMI 32.49 kg/m    GEN: A and O x 3. WDWN. NAD.    ENT: Nose clear, ext NML.  No LAD.  No JVD.  TM's clear. Oropharynx clear.  PULM: Normal WOB, no distress. No crackles, wheezes, rhonchi. CV: RRR, no M/G/R, No rubs, No JVD.   EXT: warm and well-perfused, No c/c/e. PSYCH: Pleasant and conversant.    Laboratory and Imaging Data:  Assessment and Plan:   Acute bronchitis due to other specified organisms  Asthma, moderate persistent, uncomplicated  Asthma stable now, add prn albuterol abx with persistent infection and asthma  Follow-up: No Follow-up on file.  New Prescriptions   ALBUTEROL (PROVENTIL HFA;VENTOLIN HFA) 108 (90 BASE) MCG/ACT INHALER    Inhale 2 puffs into the lungs every 6 (six) hours as needed for wheezing or shortness of breath.   DOXYCYCLINE (VIBRA-TABS) 100 MG TABLET    Take 1 tablet (100 mg total) by mouth 2 (two) times daily.   Signed,  Maud Deed. Passion Lavin, MD   Patient's Medications  New Prescriptions   ALBUTEROL (PROVENTIL HFA;VENTOLIN HFA) 108 (90 BASE) MCG/ACT INHALER    Inhale 2 puffs into the lungs every 6 (six) hours as needed for wheezing or shortness of breath.   DOXYCYCLINE (VIBRA-TABS) 100 MG TABLET    Take 1 tablet (100 mg total) by mouth 2 (two) times daily.  Previous Medications   ATENOLOL (TENORMIN) 50 MG TABLET    TAKE 1 TABLET BY MOUTH ONCE DAILY   BECLOMETHASONE (QVAR) 80 MCG/ACT INHALER    Inhale 2 puffs into the lungs 2 (two) times daily. Please call [phone] to schedule your physical.   CLONAZEPAM (KLONOPIN) 2 MG TABLET       ESTROGENS, CONJUGATED, (PREMARIN) 0.625 MG TABLET    Take 0.625 mg by mouth daily.    FENOFIBRATE 160 MG  TABLET    Take 1 tablet (160 mg total) by mouth daily.   GABAPENTIN (NEURONTIN) 600 MG TABLET    Take 600 mg by mouth 3 (three) times daily.   LAMOTRIGINE (LAMICTAL) 200 MG TABLET    Take 200 mg by mouth 2 (two) times daily.    MONTELUKAST (SINGULAIR) 10 MG TABLET    Take 1 tablet (10 mg total) by mouth at bedtime.   OMEGA-3 FATTY ACIDS (FISH OIL) 1000 MG CAPS  Take 1 capsule by mouth daily.   OMEPRAZOLE (PRILOSEC) 40 MG CAPSULE    TAKE 1 CAPSULE BY MOUTH ONCE DAILY   QUETIAPINE (SEROQUEL) 25 MG TABLET    Take 25 mg by mouth 3 (three) times daily.   QUETIAPINE (SEROQUEL) 300 MG TABLET    Take 300 mg by mouth at bedtime.   VENLAFAXINE (EFFEXOR-XR) 75 MG 24 HR CAPSULE    Take 225 mg by mouth daily.   Modified Medications   No medications on file  Discontinued Medications   DOXYCYCLINE (VIBRA-TABS) 100 MG TABLET    Take 1 tablet (100 mg total) by mouth 2 (two) times daily.

## 2016-08-08 NOTE — Progress Notes (Signed)
Pre visit review using our clinic review tool, if applicable. No additional management support is needed unless otherwise documented below in the visit note. 

## 2016-08-10 ENCOUNTER — Telehealth: Payer: Self-pay | Admitting: Behavioral Health

## 2016-08-10 ENCOUNTER — Telehealth: Payer: Self-pay | Admitting: Family Medicine

## 2016-08-10 NOTE — Telephone Encounter (Signed)
[facility] note received via fax  Call Date: 08/08/16 Time: 8:35 AM   Caller: [name] (Self) Return number: [phone]  Nurse: Reeves Forth, RN  Chief Complaint: Cough  Reason for call: Caller states that she has been sweating a lot; feels weak, has a cough and is very run down for over a week.   Related visit to physician within the last 2 weeks: No  Guideline: Weakness & Fatigue  Disposition: See Physician within 4 hours (or PCP triage)  Per the patient's chart she was seen at [facility] at [facility] on 08/08/16.

## 2016-08-10 NOTE — Telephone Encounter (Signed)
Pt asking if kt would call in Rx for fenofibrate, pt states that she wants to start back on this, [facility] [address].

## 2016-08-11 NOTE — Telephone Encounter (Signed)
Called pt and LMOVM to return call.  °

## 2016-08-11 NOTE — Telephone Encounter (Signed)
Can you please call pt and schedule a cholesterol follow up

## 2016-08-11 NOTE — Telephone Encounter (Signed)
Pt has not seen you since 09/2015 ok to restart medication or would you like her to be seen first?

## 2016-08-11 NOTE — Telephone Encounter (Signed)
Pt needs appt as it has been 10 months since her last visit to determine which meds are needed

## 2016-08-14 NOTE — Telephone Encounter (Signed)
Patient scheduled for follow up appt with pcp on 08/17/16 @ 9:30am

## 2016-08-17 ENCOUNTER — Encounter: Payer: Self-pay | Admitting: Family Medicine

## 2016-08-17 ENCOUNTER — Ambulatory Visit (INDEPENDENT_AMBULATORY_CARE_PROVIDER_SITE_OTHER): Payer: Medicare HMO | Admitting: Family Medicine

## 2016-08-17 VITALS — BP 124/80 | HR 68 | Temp 98.4°F | Resp 16 | Ht 69.0 in | Wt 217.2 lb

## 2016-08-17 DIAGNOSIS — E669 Obesity, unspecified: Secondary | ICD-10-CM

## 2016-08-17 DIAGNOSIS — E785 Hyperlipidemia, unspecified: Secondary | ICD-10-CM | POA: Diagnosis not present

## 2016-08-17 DIAGNOSIS — Z23 Encounter for immunization: Secondary | ICD-10-CM | POA: Diagnosis not present

## 2016-08-17 LAB — BASIC METABOLIC PANEL
BUN: 12 mg/dL (ref 6–23)
CALCIUM: 9 mg/dL (ref 8.4–10.5)
CHLORIDE: 105 meq/L (ref 96–112)
CO2: 25 meq/L (ref 19–32)
Creatinine, Ser: 0.57 mg/dL (ref 0.40–1.20)
GFR: 115.38 mL/min (ref >60.00)
GLUCOSE: 102 mg/dL — AB (ref 70–99)
Potassium: 4.2 mEq/L (ref 3.5–5.1)
SODIUM: 140 meq/L (ref 135–145)

## 2016-08-17 LAB — LIPID PANEL
Cholesterol: 271 mg/dL — ABNORMAL HIGH (ref 0–200)
HDL: 50.4 mg/dL (ref >39.00)
NonHDL: 220.95
Total CHOL/HDL Ratio: 5
Triglycerides: 359 mg/dL — ABNORMAL HIGH (ref 0.0–149.0)
VLDL: 71.8 mg/dL — ABNORMAL HIGH (ref 0.0–40.0)

## 2016-08-17 LAB — LDL CHOLESTEROL, DIRECT: Direct LDL: 164 mg/dL

## 2016-08-17 LAB — CBC WITH DIFFERENTIAL/PLATELET
BASOS PCT: 0.3 % (ref 0.0–3.0)
Basophils Absolute: 0 10*3/uL (ref 0.0–0.1)
EOS ABS: 0 10*3/uL (ref 0.0–0.7)
Eosinophils Relative: 0 % (ref 0.0–5.0)
HEMATOCRIT: 36.5 % (ref 36.0–46.0)
Hemoglobin: 12.5 g/dL (ref 12.0–15.0)
LYMPHS ABS: 1.1 10*3/uL (ref 0.7–4.0)
Lymphocytes Relative: 25.3 % (ref 12.0–46.0)
MCHC: 34.2 g/dL (ref 30.0–36.0)
MCV: 89.9 fl (ref 78.0–100.0)
MONO ABS: 0.3 10*3/uL (ref 0.1–1.0)
Monocytes Relative: 5.8 % (ref 3.0–12.0)
NEUTROS ABS: 3 10*3/uL (ref 1.4–7.7)
Neutrophils Relative %: 68.6 % (ref 43.0–77.0)
PLATELETS: 204 10*3/uL (ref 150.0–400.0)
RBC: 4.06 Mil/uL (ref 3.87–5.11)
RDW: 13.9 % (ref 11.5–15.5)
WBC: 4.4 10*3/uL (ref 4.0–10.5)

## 2016-08-17 LAB — HEPATIC FUNCTION PANEL
ALK PHOS: 32 U/L — AB (ref 39–117)
ALT: 14 U/L (ref 0–35)
AST: 10 U/L (ref 0–37)
Albumin: 4.1 g/dL (ref 3.5–5.2)
BILIRUBIN DIRECT: 0.1 mg/dL (ref 0.0–0.3)
TOTAL PROTEIN: 6.5 g/dL (ref 6.0–8.3)
Total Bilirubin: 0.6 mg/dL (ref 0.2–1.2)

## 2016-08-17 LAB — TSH: TSH: 0.81 u[IU]/mL (ref 0.35–4.50)

## 2016-08-17 NOTE — Assessment & Plan Note (Signed)
Chronic problem.  Pt has been off and on medication, has been to lipid clinic (but did not f/u), and has statin intolerance.  + family hx of CAD.  Check labs.  Restart meds if able or refer back to Lipid Clinic for initiation of injectable medication.  Pt expressed understanding and is in agreement w/ plan.

## 2016-08-17 NOTE — Assessment & Plan Note (Signed)
Chronic problem.  Down 3 lbs since last visit.  Stressed need for healthy diet and regular exercise.  Will follow.

## 2016-08-17 NOTE — Progress Notes (Signed)
   Subjective:    Patient ID: [name], female    DOB: [birth year], 60 y.o.   MRN: [id number]  HPI Hyperlipidemia- chronic problem, pt has been off and on Crestor over the last few years.  Not taking recently.  Pt has been intolerant to statins in the past- 'I almost couldn't walk'.  Pt was not able to afford Fenofibrate.  Previously saw [facility] Marcellina Millin 04/17/14).  Asking about possibility of injectable like Repatha.  No CP, SOB, HAs, visual changes, abd pain, N/V.   Review of Systems For ROS see HPI     Objective:   Physical Exam  Constitutional: She is oriented to person, place, and time. She appears well-developed and well-nourished. No distress.  HENT:  Head: Normocephalic and atraumatic.  Eyes: Conjunctivae and EOM are normal. Pupils are equal, round, and reactive to light.  Neck: Normal range of motion. Neck supple. No thyromegaly present.  Cardiovascular: Normal rate, regular rhythm, normal heart sounds and intact distal pulses.   No murmur heard. Pulmonary/Chest: Effort normal and breath sounds normal. No respiratory distress.  Abdominal: Soft. She exhibits no distension. There is no tenderness.  Musculoskeletal: She exhibits no edema.  Lymphadenopathy:    She has no cervical adenopathy.  Neurological: She is alert and oriented to person, place, and time.  Skin: Skin is warm and dry.  Psychiatric: She has a normal mood and affect. Her behavior is normal.  Vitals reviewed.         Assessment & Plan:

## 2016-08-17 NOTE — Progress Notes (Signed)
Pre visit review using our clinic review tool, if applicable. No additional management support is needed unless otherwise documented below in the visit note. 

## 2016-08-17 NOTE — Patient Instructions (Signed)
Schedule your complete physical in 3-4 months We'll notify you of your lab results and make any changes if needed Continue to work on healthy diet and regular exercise- you can do it! Call with any questions or concerns Welcome Back!! Happy [place] Birthday!!

## 2016-08-18 ENCOUNTER — Other Ambulatory Visit: Payer: Self-pay | Admitting: General Practice

## 2016-08-18 MED ORDER — FENOFIBRATE 160 MG PO TABS: 160.0000 mg | ORAL_TABLET | Freq: Every day | ORAL | 1 refills | Status: DC

## 2016-08-18 MED ORDER — EZETIMIBE 10 MG PO TABS: 10.0000 mg | ORAL_TABLET | Freq: Every day | ORAL | 6 refills | Status: DC

## 2016-09-03 DIAGNOSIS — G4733 Obstructive sleep apnea (adult) (pediatric): Secondary | ICD-10-CM | POA: Diagnosis not present

## 2016-10-09 ENCOUNTER — Other Ambulatory Visit: Payer: Self-pay | Admitting: Family Medicine

## 2016-10-13 DIAGNOSIS — R69 Illness, unspecified: Secondary | ICD-10-CM | POA: Diagnosis not present

## 2016-10-22 ENCOUNTER — Ambulatory Visit (INDEPENDENT_AMBULATORY_CARE_PROVIDER_SITE_OTHER): Payer: Medicare HMO | Admitting: Obstetrics & Gynecology

## 2016-10-22 ENCOUNTER — Encounter: Payer: Self-pay | Admitting: Obstetrics & Gynecology

## 2016-10-22 VITALS — BP 120/68 | HR 74 | Resp 14 | Ht 69.0 in | Wt 224.6 lb

## 2016-10-22 DIAGNOSIS — Z1211 Encounter for screening for malignant neoplasm of colon: Secondary | ICD-10-CM | POA: Diagnosis not present

## 2016-10-22 DIAGNOSIS — Z205 Contact with and (suspected) exposure to viral hepatitis: Secondary | ICD-10-CM

## 2016-10-22 DIAGNOSIS — Z01419 Encounter for gynecological examination (general) (routine) without abnormal findings: Secondary | ICD-10-CM

## 2016-10-22 DIAGNOSIS — N898 Other specified noninflammatory disorders of vagina: Secondary | ICD-10-CM | POA: Diagnosis not present

## 2016-10-22 LAB — HEPATITIS C ANTIBODY: HCV Ab: NEGATIVE

## 2016-10-22 MED ORDER — ESTROGENS CONJUGATED 0.625 MG PO TABS: 0.6250 mg | ORAL_TABLET | Freq: Every day | ORAL | 13 refills | Status: DC

## 2016-10-22 NOTE — Progress Notes (Signed)
60 y.o. G0P0000 SingleCaucasianF here for annual exam.  New pt today.  Did see Dr. Radene Knee at [facility].  Menopausal for about 15 years.  Has been on Premarin for years.  Has tried estradiol and the patch and these just do not work as well.  Would like to eventually try to decrease dosage of HRT, maybe not right now during the holidays.    Denies vaginal bleeding.   Patient's last menstrual period was 11/09/2000.          Sexually active: No.  The current method of family planning is status post hysterectomy.    Exercising: No.  The patient does not participate in regular exercise at present. Smoker:  no  Health Maintenance: Pap:  Unsure of last one, has hysterectomy History of abnormal Pap:  no MMG:  07/11/15 at [facility] Colonoscopy:  2008 with Dr. Carlean Purl- pt states she is due BMD:   07/11/15 at [facility] TDaP:   07/15/2012  Pneumonia vaccine(s):  09/02/12  Zostavax:   never Hep C testing: will do today Screening Labs: PCP, Hb today: PCP, Urine today: PCP   reports that she has never smoked. She has never used smokeless tobacco. She reports that she drinks alcohol. She reports that she does not use drugs.  Past Medical History:  Diagnosis Date  . Allergic rhinitis   . Anxiety   . Arthritis    R knee, Left Knee  . Asthma   . Bipolar disorder ([place])   . Cataract   . Complication of anesthesia    had bad anxiety after anesthesia  . Constipation   . Constipation, chronic   . Depression   . Dysrhythmia    Palpations occ, takes Tenormin  . GERD (gastroesophageal reflux disease)   . Heart murmur   . Hyperlipidemia   . MVP (mitral valve prolapse)   . Urinary tract infection    frequent, see Urololgist 12/16/2010    Past Surgical History:  Procedure Laterality Date  . ABDOMINAL HYSTERECTOMY  2002   no oophorectomy  . BACK SURGERY  2001   L5 - S1  . NASAL SINUS SURGERY  01/2010   x2  . TONSILLECTOMY    . TOTAL KNEE ARTHROPLASTY  12/23/2011    Procedure: TOTAL KNEE ARTHROPLASTY;  Surgeon: Ninetta Lights, MD;  Location: [facility];  Service: Orthopedics;  Laterality: Right;  DR MURPHY WANTS 120 MINUTES FOR SURGERY  . TOTAL KNEE ARTHROPLASTY  08/31/2012   left knee  . TOTAL KNEE ARTHROPLASTY  08/31/2012   Procedure: TOTAL KNEE ARTHROPLASTY;  Surgeon: Ninetta Lights, MD;  Location: [facility];  Service: Orthopedics;  Laterality: Left;    Current Outpatient Prescriptions  Medication Sig Dispense Refill  . albuterol (PROVENTIL HFA;VENTOLIN HFA) 108 (90 Base) MCG/ACT inhaler Inhale 2 puffs into the lungs every 6 (six) hours as needed for wheezing or shortness of breath. 1 Inhaler 0  . atenolol (TENORMIN) 50 MG tablet TAKE 1 TABLET BY MOUTH ONCE DAILY 180 tablet 0  . beclomethasone (QVAR) 80 MCG/ACT inhaler Inhale 2 puffs into the lungs 2 (two) times daily. Please call [phone] to schedule your physical. 1 Inhaler 0  . clonazePAM (KLONOPIN) 2 MG tablet 1 mg 4 (four) times daily as needed.   0  . estrogens, conjugated, (PREMARIN) 0.625 MG tablet Take 0.625 mg by mouth daily.     . fenofibrate 160 MG tablet Take 1 tablet (160 mg total) by mouth daily. 90 tablet 1  .  gabapentin (NEURONTIN) 600 MG tablet Take 600 mg by mouth 4 (four) times daily.     Marland Kitchen lamoTRIgine (LAMICTAL) 200 MG tablet Take 200 mg by mouth 2 (two) times daily.     . montelukast (SINGULAIR) 10 MG tablet Take 1 tablet (10 mg total) by mouth at bedtime. 30 tablet 3  . Omega-3 Fatty Acids (FISH OIL) 1000 MG CAPS Take 1 capsule by mouth daily.    . QUEtiapine (SEROQUEL XR) 400 MG 24 hr tablet TK 1 T PO QPM WF  0  . QUEtiapine (SEROQUEL) 50 MG tablet TK 1 T PO TID  3  . venlafaxine (EFFEXOR-XR) 75 MG 24 hr capsule Take 225 mg by mouth daily.      No current facility-administered medications for this visit.     Family History  Problem Relation Age of Onset  . Coronary artery disease Father   . Heart attack Father 78  . Heart disease Father   . Lung disease Mother      Mycobacterium avium intracellulare  . Hypertension Brother   . Hyperlipidemia Brother   . Anesthesia problems Neg Hx     ROS:  Pertinent items are noted in HPI.  Otherwise, a comprehensive ROS was negative.  Exam:   BP 120/68 (BP Location: Right Arm, Patient Position: Sitting, Cuff Size: Large)   Pulse 74   Resp 14   Ht 5\' 9"  (1.753 m)   Wt 224 lb 9.6 oz (101.9 kg)   LMP 11/09/2000   BMI 33.17 kg/m    Height: 5\' 9"  (175.3 cm)  Ht Readings from Last 3 Encounters:  10/22/16 5\' 9"  (1.753 m)  08/17/16 5\' 9"  (1.753 m)  07/11/16 5\' 9"  (1.753 m)   General appearance: alert, cooperative and appears stated age Head: Normocephalic, without obvious abnormality, atraumatic Neck: no adenopathy, supple, symmetrical, trachea midline and thyroid normal to inspection and palpation Lungs: clear to auscultation bilaterally Breasts: normal appearance, no masses or tenderness Heart: regular rate and rhythm Abdomen: soft, non-tender; bowel sounds normal; no masses,  no organomegaly Extremities: extremities normal, atraumatic, no cyanosis or edema Skin: Skin color, texture, turgor normal. No rashes or lesions Lymph nodes: Cervical, supraclavicular, and axillary nodes normal. No abnormal inguinal nodes palpated Neurologic: Grossly normal  Pelvic: External genitalia:  no lesions              Urethra:  normal appearing urethra with no masses, tenderness or lesions              Bartholins and Skenes: normal                 Vagina: normal appearing vagina with normal color and discharge, no lesions              Cervix: absent              Pap taken: No. Bimanual Exam:  Uterus:  uterus absent              Adnexa: normal adnexa and no mass, fullness, tenderness               Rectovaginal: Confirms               Anus:  normal sphincter tone, no lesions  Chaperone was present for exam.  A:  Well Woman with normal exam PMP, on HRT H/O TAH (ovaries remain) due to fibroids GERD Asthma On  HRT Bipolar d/o Elevated lipids Vit D deficiency Recurrent vaginal odor  P:  Mammogram guidelines reveiwed.  Pt is going to do a 3D MMG this year. pap smear not indicated Referral for colonoscopy Hep C antibody today.  Additional blood work done with [name] this year Affirm pending return annually or prn

## 2016-10-23 LAB — WET PREP BY MOLECULAR PROBE
CANDIDA SPECIES: NEGATIVE
Gardnerella vaginalis: NEGATIVE
TRICHOMONAS VAG: NEGATIVE

## 2016-10-28 ENCOUNTER — Telehealth: Payer: Self-pay | Admitting: Obstetrics & Gynecology

## 2016-10-28 NOTE — Telephone Encounter (Signed)
Left voicemail regarding referral appointment. The information is listed below. Should the patient need to cancel or reschedule this appointment, please advise them to call the office they've been referred to in order to reschedule.  [facility] Phone: [phone]  Dr Collene Mares 10/29/16 @ 230pm. Please arrive 15 minutes early with insurance card and photo id.

## 2016-11-06 DIAGNOSIS — Z1231 Encounter for screening mammogram for malignant neoplasm of breast: Secondary | ICD-10-CM | POA: Diagnosis not present

## 2016-11-06 LAB — HM MAMMOGRAPHY

## 2016-11-11 ENCOUNTER — Encounter: Payer: Self-pay | Admitting: General Practice

## 2016-11-16 DIAGNOSIS — R69 Illness, unspecified: Secondary | ICD-10-CM | POA: Diagnosis not present

## 2016-11-26 DIAGNOSIS — G4733 Obstructive sleep apnea (adult) (pediatric): Secondary | ICD-10-CM | POA: Diagnosis not present

## 2016-12-03 DIAGNOSIS — R69 Illness, unspecified: Secondary | ICD-10-CM | POA: Diagnosis not present

## 2016-12-04 ENCOUNTER — Telehealth: Payer: Self-pay

## 2016-12-04 NOTE — Telephone Encounter (Signed)
LM requesting return call regarding AWV. Pt is scheduled for AWV with PCP on 12/09/16. Requesting patient to come in for AWV with [facility] (same day) at 2pm, to be followed by OV with PCP at 3:15.

## 2016-12-09 ENCOUNTER — Ambulatory Visit: Payer: Medicare HMO | Admitting: Family Medicine

## 2016-12-16 DIAGNOSIS — R69 Illness, unspecified: Secondary | ICD-10-CM | POA: Diagnosis not present

## 2016-12-29 DIAGNOSIS — R69 Illness, unspecified: Secondary | ICD-10-CM | POA: Diagnosis not present

## 2017-01-04 ENCOUNTER — Encounter: Payer: Self-pay | Admitting: Family Medicine

## 2017-01-04 ENCOUNTER — Ambulatory Visit (INDEPENDENT_AMBULATORY_CARE_PROVIDER_SITE_OTHER): Payer: Medicare HMO | Admitting: Family Medicine

## 2017-01-04 VITALS — BP 130/80 | HR 71 | Temp 98.8°F | Resp 16 | Ht 69.0 in | Wt 233.5 lb

## 2017-01-04 DIAGNOSIS — R059 Cough, unspecified: Secondary | ICD-10-CM

## 2017-01-04 DIAGNOSIS — R05 Cough: Secondary | ICD-10-CM | POA: Diagnosis not present

## 2017-01-04 DIAGNOSIS — J4541 Moderate persistent asthma with (acute) exacerbation: Secondary | ICD-10-CM

## 2017-01-04 MED ORDER — IPRATROPIUM-ALBUTEROL 0.5-2.5 (3) MG/3ML IN SOLN
3.0000 mL | Freq: Once | RESPIRATORY_TRACT | Status: AC
  Administered 2017-01-04: 3 mL via RESPIRATORY_TRACT

## 2017-01-04 MED ORDER — ALBUTEROL SULFATE HFA 108 (90 BASE) MCG/ACT IN AERS: 2.0000 | INHALATION_SPRAY | Freq: Four times a day (QID) | RESPIRATORY_TRACT | 0 refills | Status: DC | PRN

## 2017-01-04 MED ORDER — GUAIFENESIN-CODEINE 100-10 MG/5ML PO SYRP: 10.0000 mL | ORAL_SOLUTION | Freq: Three times a day (TID) | ORAL | 0 refills | Status: DC | PRN

## 2017-01-04 MED ORDER — DOXYCYCLINE HYCLATE 100 MG PO TABS: 100.0000 mg | ORAL_TABLET | Freq: Two times a day (BID) | ORAL | 0 refills | Status: DC

## 2017-01-04 MED ORDER — BECLOMETHASONE DIPROPIONATE 80 MCG/ACT IN AERS: 2.0000 | INHALATION_SPRAY | Freq: Two times a day (BID) | RESPIRATORY_TRACT | 0 refills | Status: DC

## 2017-01-04 NOTE — Progress Notes (Signed)
   Subjective:    Patient ID: [name], female    DOB: 12-13-1955, 61 y.o.   MRN: [id number]  HPI URI-  sxs started Thursday w/ sore throat and cough.  sxs have been progressively worsening until last night when she could barely use her inhaler.  No fever.  + SOB.  + HA, sinus pressure.  No ear pain.  No known sick contacts.  Hx of underlying asthma.   Review of Systems For ROS see HPI     Objective:   Physical Exam  Constitutional: She is oriented to person, place, and time. She appears well-developed and well-nourished. No distress.  HENT:  Head: Normocephalic and atraumatic.  TMs normal bilaterally Mild nasal congestion Throat w/out erythema, edema, or exudate  Eyes: Conjunctivae and EOM are normal. Pupils are equal, round, and reactive to light.  Neck: Normal range of motion. Neck supple.  Cardiovascular: Normal rate, regular rhythm, normal heart sounds and intact distal pulses.   No murmur heard. Pulmonary/Chest: Effort normal. No respiratory distress. She has wheezes (diffuse expiratory wheezes- improved s/p neb tx but wheezes continue at bases).  + hacking cough  Lymphadenopathy:    She has no cervical adenopathy.  Neurological: She is alert and oriented to person, place, and time.  Skin: Skin is warm and dry.  Psychiatric: She has a normal mood and affect. Her behavior is normal. Thought content normal.  Vitals reviewed.         Assessment & Plan:  Asthmatic bronchitis- new.  Pt's sxs were severe upon arrival but improved s/p duoneb tx in office.  Start abx.  Restart Qvar and albuterol prn.  Cough meds prn.  Reviewed supportive care and red flags that should prompt return. Pt expressed understanding and is in agreement w/ plan.

## 2017-01-04 NOTE — Patient Instructions (Signed)
Follow up as needed Start the Doxycycline twice daily- take w/ food Drink plenty of fluids REST! Cough syrup as needed- it does have codeine so it can make you drowsy Restart the Qvar 2 puffs twice daily Use the albuterol as needed for cough, shortness or breath, wheezing Call with any questions or concerns Hang in there!!!

## 2017-01-04 NOTE — Progress Notes (Signed)
Pre visit review using our clinic review tool, if applicable. No additional management support is needed unless otherwise documented below in the visit note. 

## 2017-01-06 ENCOUNTER — Ambulatory Visit (INDEPENDENT_AMBULATORY_CARE_PROVIDER_SITE_OTHER): Payer: Medicare HMO | Admitting: Physician Assistant

## 2017-01-06 ENCOUNTER — Encounter: Payer: Self-pay | Admitting: Physician Assistant

## 2017-01-06 ENCOUNTER — Ambulatory Visit (INDEPENDENT_AMBULATORY_CARE_PROVIDER_SITE_OTHER): Payer: Medicare HMO

## 2017-01-06 VITALS — BP 122/80 | HR 66 | Temp 98.6°F | Resp 16 | Ht 69.0 in | Wt 233.0 lb

## 2017-01-06 DIAGNOSIS — J4541 Moderate persistent asthma with (acute) exacerbation: Secondary | ICD-10-CM

## 2017-01-06 DIAGNOSIS — J209 Acute bronchitis, unspecified: Secondary | ICD-10-CM

## 2017-01-06 DIAGNOSIS — J9811 Atelectasis: Secondary | ICD-10-CM | POA: Diagnosis not present

## 2017-01-06 MED ORDER — IPRATROPIUM-ALBUTEROL 0.5-2.5 (3) MG/3ML IN SOLN
3.0000 mL | Freq: Once | RESPIRATORY_TRACT | Status: AC
  Administered 2017-01-06: 3 mL via RESPIRATORY_TRACT

## 2017-01-06 MED ORDER — BENZONATATE 100 MG PO CAPS: 100.0000 mg | ORAL_CAPSULE | Freq: Two times a day (BID) | ORAL | 0 refills | Status: DC | PRN

## 2017-01-06 MED ORDER — FLUTICASONE-SALMETEROL 250-50 MCG/DOSE IN AEPB: 1.0000 | INHALATION_SPRAY | Freq: Two times a day (BID) | RESPIRATORY_TRACT | 0 refills | Status: DC

## 2017-01-06 NOTE — Patient Instructions (Signed)
Please stop by the front desk to speak to [place]. She will help give you directions for your x-ray at [place]. Stop the Qvar and take the Advair as directed during this sickness. Should help with breathing.  Continue albuterol inhaler as directed. Continue antibiotic as directed. Stop the cough syrup as it is not working. Start the [place] as directed. Also take a plain Mucinex twice daily with good fluid intake.  We will alter your regimen based on results. Follow-up if symptoms not improving within 48 hours. ER if anything worsens.

## 2017-01-06 NOTE — Progress Notes (Signed)
Patient presents to clinic today c/o continued cough with chest tightness and wheezing. Patient was seen by PCP 2 days ago. Was started on doxycycline and cough medication for URI. Endorses taking as directed. Notes cough medication is not working. Denies fever, chills. Cough is dry. Denies chest pain, racing heart.   Past Medical History:  Diagnosis Date  . Allergic rhinitis   . Anxiety   . Arthritis    R knee, Left Knee  . Asthma   . Bipolar disorder ([place])   . Cataract   . Complication of anesthesia    had bad anxiety after anesthesia  . Constipation   . Constipation, chronic   . Depression   . Dysrhythmia    Palpations occ, takes Tenormin  . GERD (gastroesophageal reflux disease)   . Heart murmur   . Hyperlipidemia   . MVP (mitral valve prolapse)   . Urinary tract infection    frequent, see Urololgist 12/16/2010    Current Outpatient Prescriptions on File Prior to Visit  Medication Sig Dispense Refill  . albuterol (PROVENTIL HFA;VENTOLIN HFA) 108 (90 Base) MCG/ACT inhaler Inhale 2 puffs into the lungs every 6 (six) hours as needed for wheezing or shortness of breath. 1 Inhaler 0  . atenolol (TENORMIN) 50 MG tablet TAKE 1 TABLET BY MOUTH ONCE DAILY 180 tablet 0  . beclomethasone (QVAR) 80 MCG/ACT inhaler Inhale 2 puffs into the lungs 2 (two) times daily. 1 Inhaler 0  . clonazePAM (KLONOPIN) 2 MG tablet 1 mg 4 (four) times daily as needed.   0  . doxycycline (VIBRA-TABS) 100 MG tablet Take 1 tablet (100 mg total) by mouth 2 (two) times daily. 20 tablet 0  . estrogens, conjugated, (PREMARIN) 0.625 MG tablet Take 1 tablet (0.625 mg total) by mouth daily. 30 tablet 13  . fenofibrate 160 MG tablet Take 1 tablet (160 mg total) by mouth daily. 90 tablet 1  . gabapentin (NEURONTIN) 600 MG tablet Take 600 mg by mouth 4 (four) times daily.     Marland Kitchen guaiFENesin-codeine (ROBITUSSIN AC) 100-10 MG/5ML syrup Take 10 mLs by mouth 3 (three) times daily as needed for cough. 240 mL 0  .  lamoTRIgine (LAMICTAL) 200 MG tablet Take 200 mg by mouth 2 (two) times daily.     . montelukast (SINGULAIR) 10 MG tablet Take 1 tablet (10 mg total) by mouth at bedtime. 30 tablet 3  . Omega-3 Fatty Acids (FISH OIL) 1000 MG CAPS Take 1 capsule by mouth daily.    . QUEtiapine (SEROQUEL XR) 400 MG 24 hr tablet TK 1 T PO QPM WF  0  . QUEtiapine (SEROQUEL) 50 MG tablet TK 1 T PO TID  3  . venlafaxine (EFFEXOR-XR) 75 MG 24 hr capsule Take 225 mg by mouth daily.      No current facility-administered medications on file prior to visit.     Allergies  Allergen Reactions  . Nitrofurantoin Monohyd Macro Nausea Only    Severe headache  . Sulfa Antibiotics Nausea And Vomiting  . Alprazolam Anxiety and Other (See Comments)    Hyper   . Amoxicillin Rash  . Prednisone Anxiety    Family History  Problem Relation Age of Onset  . Coronary artery disease Father   . Heart attack Father 17  . Heart disease Father   . Lung disease Mother     Mycobacterium avium intracellulare  . Hypertension Brother   . Hyperlipidemia Brother   . Anesthesia problems Neg Hx     Social  History   Social History  . Marital status: Single    Spouse name: N/A  . Number of children: 0  . Years of education: 24   Occupational History  . retired//disability    Social History Main Topics  . Smoking status: Never Smoker  . Smokeless tobacco: Never Used  . Alcohol use 0.0 oz/week     Comment: Occasional glass of wine/"hardly ever"  . Drug use: No  . Sexual activity: No   Other Topics Concern  . 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   2 cups of coffee a day, Rare soda     Review of Systems - See HPI.  All other ROS are negative.  BP 122/80   Temp 98.6 F (37 C) (Oral)   Resp 16   Ht 5\' 9"  (1.753 m)   Wt 233 lb (105.7 kg)   LMP 11/09/2000   SpO2 94%   BMI 34.41 kg/m   Physical Exam  Constitutional: She is oriented to person, place, and time and  well-developed, well-nourished, and in no distress.  HENT:  Head: Normocephalic and atraumatic.  Right Ear: External ear normal.  Left Ear: External ear normal.  Nose: Nose normal.  Mouth/Throat: Oropharynx is clear and moist. No oropharyngeal exudate.  TM within normal limits bilaterally.  Eyes: Conjunctivae are normal.  Neck: Neck supple.  Cardiovascular: Normal rate, regular rhythm, normal heart sounds and intact distal pulses.   Pulmonary/Chest: Effort normal. No respiratory distress. She has wheezes. She has no rales. She exhibits no tenderness.  Neurological: She is alert and oriented to person, place, and time.  Skin: Skin is warm and dry. No rash noted.  Psychiatric: Affect normal.  Vitals reviewed.   Recent Results (from the past 2160 hour(s))  WET PREP BY MOLECULAR PROBE     Status: None   Collection Time: 10/22/16 10:20 AM  Result Value Ref Range   Candida species NEG Negative   Trichomonas vaginosis NEG Negative   Gardnerella vaginalis NEG Negative  Hepatitis C antibody     Status: None   Collection Time: 10/22/16 10:23 AM  Result Value Ref Range   HCV Ab NEGATIVE NEGATIVE  HM MAMMOGRAPHY     Status: None   Collection Time: 11/06/16 12:00 AM  Result Value Ref Range   HM Mammogram 0-4 Bi-Rad 0-4 Bi-Rad, Self Reported Normal    Comment: bi-rads 2/[facility]    Assessment/Plan: 1. Moderate persistent asthma with acute bronchitis and acute exacerbation Xray today to r/o pneumonia. Continue doxycycline for now. Symptoms improved with duoneb. Will stop Qvar and Start Advair during exacerbation. Trying to avoid systemic steroids due to bipolar disorder and usually mania/anxiety 2/2 systemic steroids. Stop cough syrup. Start tessalon. Supportive measures and OTC medications reviewed. Strict return precautions given.   - DG Chest 2 View; Future - ipratropium-albuterol (DUONEB) 0.5-2.5 (3) MG/3ML nebulizer solution 3 mL; Take 3 mLs by nebulization once.   Leeanne Rio, PA-C

## 2017-01-06 NOTE — Progress Notes (Signed)
Pre visit review using our clinic review tool, if applicable. No additional management support is needed unless otherwise documented below in the visit note. 

## 2017-01-07 ENCOUNTER — Telehealth: Payer: Self-pay | Admitting: Family Medicine

## 2017-01-07 NOTE — Telephone Encounter (Signed)
Patient states Patina called her this morning and she is returning call.  Patient reports she is not feeling better.

## 2017-01-07 NOTE — Telephone Encounter (Signed)
Spoke with patient and she states she having rattling in her chest. She wanted to be recheck before the weekend. She is scheduled

## 2017-01-08 ENCOUNTER — Encounter: Payer: Self-pay | Admitting: Physician Assistant

## 2017-01-08 ENCOUNTER — Ambulatory Visit (INDEPENDENT_AMBULATORY_CARE_PROVIDER_SITE_OTHER): Payer: Medicare HMO | Admitting: Physician Assistant

## 2017-01-08 VITALS — BP 146/92 | HR 74 | Temp 98.5°F | Resp 16 | Ht 69.0 in | Wt 233.0 lb

## 2017-01-08 DIAGNOSIS — J189 Pneumonia, unspecified organism: Secondary | ICD-10-CM

## 2017-01-08 DIAGNOSIS — J181 Lobar pneumonia, unspecified organism: Secondary | ICD-10-CM | POA: Diagnosis not present

## 2017-01-08 MED ORDER — ALBUTEROL SULFATE (2.5 MG/3ML) 0.083% IN NEBU
2.5000 mg | INHALATION_SOLUTION | Freq: Once | RESPIRATORY_TRACT | Status: AC
  Administered 2017-01-08: 2.5 mg via RESPIRATORY_TRACT

## 2017-01-08 MED ORDER — FLUTICASONE PROPIONATE 50 MCG/ACT NA SUSP: 2.0000 | Freq: Every day | NASAL | 0 refills | Status: DC

## 2017-01-08 MED ORDER — NYSTATIN 100000 UNIT/ML MT SUSP: 5.0000 mL | Freq: Four times a day (QID) | OROMUCOSAL | 0 refills | Status: DC

## 2017-01-08 NOTE — Progress Notes (Signed)
Pre visit review using our clinic review tool, if applicable. No additional management support is needed unless otherwise documented below in the visit note. 

## 2017-01-08 NOTE — Progress Notes (Signed)
Patient presents to clinic today for repeat assessment of CAP. Is on Doxycycline and taking as directed. Notes improvement in breathing with change from Qvar to Advair. Denies recurrence of fever. Endorses still having productive cough that is thinned out. Denies hemoptysis. Denies chest pain or SOB. Tessalon perles are working well for her. Denies new or worsening symptoms.   Past Medical History:  Diagnosis Date  . Allergic rhinitis   . Anxiety   . Arthritis    R knee, Left Knee  . Asthma   . Bipolar disorder ([place])   . Cataract   . Complication of anesthesia    had bad anxiety after anesthesia  . Constipation   . Constipation, chronic   . Depression   . Dysrhythmia    Palpations occ, takes Tenormin  . GERD (gastroesophageal reflux disease)   . Heart murmur   . Hyperlipidemia   . MVP (mitral valve prolapse)   . Urinary tract infection    frequent, see Urololgist 12/16/2010    Current Outpatient Prescriptions on File Prior to Visit  Medication Sig Dispense Refill  . albuterol (PROVENTIL HFA;VENTOLIN HFA) 108 (90 Base) MCG/ACT inhaler Inhale 2 puffs into the lungs every 6 (six) hours as needed for wheezing or shortness of breath. 1 Inhaler 0  . atenolol (TENORMIN) 50 MG tablet TAKE 1 TABLET BY MOUTH ONCE DAILY 180 tablet 0  . beclomethasone (QVAR) 80 MCG/ACT inhaler Inhale 2 puffs into the lungs 2 (two) times daily. 1 Inhaler 0  . benzonatate (TESSALON) 100 MG capsule Take 1 capsule (100 mg total) by mouth 2 (two) times daily as needed for cough. 20 capsule 0  . clonazePAM (KLONOPIN) 2 MG tablet 1 mg 4 (four) times daily as needed.   0  . doxycycline (VIBRA-TABS) 100 MG tablet Take 1 tablet (100 mg total) by mouth 2 (two) times daily. 20 tablet 0  . estrogens, conjugated, (PREMARIN) 0.625 MG tablet Take 1 tablet (0.625 mg total) by mouth daily. 30 tablet 13  . fenofibrate 160 MG tablet Take 1 tablet (160 mg total) by mouth daily. 90 tablet 1  . Fluticasone-Salmeterol (ADVAIR  DISKUS) 250-50 MCG/DOSE AEPB Inhale 1 puff into the lungs 2 (two) times daily. 60 each 0  . gabapentin (NEURONTIN) 600 MG tablet Take 600 mg by mouth 4 (four) times daily.     Marland Kitchen lamoTRIgine (LAMICTAL) 200 MG tablet Take 200 mg by mouth 2 (two) times daily.     . montelukast (SINGULAIR) 10 MG tablet Take 1 tablet (10 mg total) by mouth at bedtime. 30 tablet 3  . Omega-3 Fatty Acids (FISH OIL) 1000 MG CAPS Take 1 capsule by mouth daily.    . QUEtiapine (SEROQUEL XR) 400 MG 24 hr tablet TK 1 T PO QPM WF  0  . QUEtiapine (SEROQUEL) 50 MG tablet TK 1 T PO TID  3  . venlafaxine (EFFEXOR-XR) 75 MG 24 hr capsule Take 225 mg by mouth daily.      No current facility-administered medications on file prior to visit.     Allergies  Allergen Reactions  . Nitrofurantoin Monohyd Macro Nausea Only    Severe headache  . Sulfa Antibiotics Nausea And Vomiting  . Alprazolam Anxiety and Other (See Comments)    Hyper   . Amoxicillin Rash  . Prednisone Anxiety    Family History  Problem Relation Age of Onset  . Coronary artery disease Father   . Heart attack Father 73  . Heart disease Father   .  Lung disease Mother     Mycobacterium avium intracellulare  . Hypertension Brother   . Hyperlipidemia Brother   . Anesthesia problems Neg Hx    Social History   Social History  . Marital status: Single    Spouse name: N/A  . Number of children: 0  . Years of education: 29   Occupational History  . retired//disability    Social History Main Topics  . Smoking status: Never Smoker  . Smokeless tobacco: Never Used  . Alcohol use 0.0 oz/week     Comment: Occasional glass of wine/"hardly ever"  . Drug use: No  . Sexual activity: No   Other Topics Concern  . 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   2 cups of coffee a day, Rare soda    Review of Systems - See HPI.  All other ROS are negative.  BP (!) 146/92   Pulse 74   Temp 98.5 F (36.9 C)  (Oral)   Resp 16   Ht 5\' 9"  (1.753 m)   Wt 233 lb (105.7 kg)   LMP 11/09/2000   SpO2 96%   BMI 34.41 kg/m   Physical Exam  Constitutional: She is well-developed, well-nourished, and in no distress.  HENT:  Head: Normocephalic and atraumatic.  Eyes: Conjunctivae are normal.  Neck: Neck supple.  Cardiovascular: Normal rate, regular rhythm, normal heart sounds and intact distal pulses.   Pulmonary/Chest: Effort normal and breath sounds normal. No respiratory distress. She has no wheezes. She has no rales. She exhibits no tenderness.  Lymphadenopathy:    She has no cervical adenopathy.  Skin: Skin is warm and dry. No rash noted. No erythema.  Psychiatric: Affect normal.  Vitals reviewed.  Recent Results (from the past 2160 hour(s))  WET PREP BY MOLECULAR PROBE     Status: None   Collection Time: 10/22/16 10:20 AM  Result Value Ref Range   Candida species NEG Negative   Trichomonas vaginosis NEG Negative   Gardnerella vaginalis NEG Negative  Hepatitis C antibody     Status: None   Collection Time: 10/22/16 10:23 AM  Result Value Ref Range   HCV Ab NEGATIVE NEGATIVE  HM MAMMOGRAPHY     Status: None   Collection Time: 11/06/16 12:00 AM  Result Value Ref Range   HM Mammogram 0-4 Bi-Rad 0-4 Bi-Rad, Self Reported Normal    Comment: bi-rads 2/[facility]    Assessment/Plan: 1. Community acquired pneumonia of right middle lobe of lung ([place]) Improving with Doxycycline. Afebrile. O2 sats improved. Some mild sinus pressure noted. Add on Flonase. Alb neb given. Continue respiratory medications as directed. Continue tessalon as directed. FU scheduled. Any worsening symptoms, patient to return immediately or go to the ER.   - albuterol (PROVENTIL) (2.5 MG/3ML) 0.083% nebulizer solution 2.5 mg; Take 3 mLs (2.5 mg total) by nebulization once.   Leeanne Rio, PA-C

## 2017-01-08 NOTE — Patient Instructions (Signed)
Lungs improved with the albuterol.  I am glad you are feeling better. Please finish the entire course of the antibiotic. Continue inhalers as directed, making sure to rinse mouth out each time.  I have sent in a prescription for nystatin mouthwash to help with the small bit of thrush (yeast) that has developed. Take a probiotic daily as directed. Use the Flonase as directed.  If symptoms are not continuing to improve over the weekend or any fever recurs while on antibiotic, please go to the ER. Otherwise, follow-up once antibiotic is completed.

## 2017-01-14 ENCOUNTER — Ambulatory Visit (INDEPENDENT_AMBULATORY_CARE_PROVIDER_SITE_OTHER): Payer: Medicare HMO | Admitting: Physician Assistant

## 2017-01-14 ENCOUNTER — Encounter: Payer: Self-pay | Admitting: Physician Assistant

## 2017-01-14 VITALS — BP 130/88 | HR 72 | Temp 98.6°F | Resp 14 | Ht 69.0 in | Wt 234.0 lb

## 2017-01-14 DIAGNOSIS — J181 Lobar pneumonia, unspecified organism: Secondary | ICD-10-CM

## 2017-01-14 DIAGNOSIS — J189 Pneumonia, unspecified organism: Secondary | ICD-10-CM

## 2017-01-14 NOTE — Patient Instructions (Signed)
Please continue staying well hydrated. Get plenty of rest. Continue Advair over the next week and then switch back to your Qvar (previous regimen). Continue saline nasal rinses and Flonase. If symptoms are not continuing to resolve or if you note any new or worsening symptoms, please return immediately.

## 2017-01-14 NOTE — Progress Notes (Signed)
Pt presents to clinic today for pneumonia follow-up. Pt finished course of antibiotics yesterday. States overall she is feeling much better. Reports weakness and fatigue, but states cough is much better. Noticed a small bit of sputum yesterday, but states this is much improved. Additionally reports frontal sinus headache, states she is still congested. Denies ear fullness, ear pain, tinnitus, myalgia, arthralgia, chest pain, palpitations, SOB, wheezing.   Past Medical History:  Diagnosis Date  . Allergic rhinitis   . Anxiety   . Arthritis    R knee, Left Knee  . Asthma   . Bipolar disorder ([place])   . Cataract   . Complication of anesthesia    had bad anxiety after anesthesia  . Constipation   . Constipation, chronic   . Depression   . Dysrhythmia    Palpations occ, takes Tenormin  . GERD (gastroesophageal reflux disease)   . Heart murmur   . Hyperlipidemia   . MVP (mitral valve prolapse)   . Urinary tract infection    frequent, see Urololgist 12/16/2010    Current Outpatient Prescriptions on File Prior to Visit  Medication Sig Dispense Refill  . albuterol (PROVENTIL HFA;VENTOLIN HFA) 108 (90 Base) MCG/ACT inhaler Inhale 2 puffs into the lungs every 6 (six) hours as needed for wheezing or shortness of breath. 1 Inhaler 0  . atenolol (TENORMIN) 50 MG tablet TAKE 1 TABLET BY MOUTH ONCE DAILY 180 tablet 0  . benzonatate (TESSALON) 100 MG capsule Take 1 capsule (100 mg total) by mouth 2 (two) times daily as needed for cough. 20 capsule 0  . clonazePAM (KLONOPIN) 2 MG tablet 1 mg 4 (four) times daily as needed.   0  . estrogens, conjugated, (PREMARIN) 0.625 MG tablet Take 1 tablet (0.625 mg total) by mouth daily. 30 tablet 13  . fenofibrate 160 MG tablet Take 1 tablet (160 mg total) by mouth daily. 90 tablet 1  . fluticasone (FLONASE) 50 MCG/ACT nasal spray Place 2 sprays into both nostrils daily. 16 g 0  . Fluticasone-Salmeterol (ADVAIR DISKUS) 250-50 MCG/DOSE AEPB Inhale 1 puff into  the lungs 2 (two) times daily. 60 each 0  . gabapentin (NEURONTIN) 600 MG tablet Take 600 mg by mouth 4 (four) times daily.     Marland Kitchen lamoTRIgine (LAMICTAL) 200 MG tablet Take 200 mg by mouth 2 (two) times daily.     . montelukast (SINGULAIR) 10 MG tablet Take 1 tablet (10 mg total) by mouth at bedtime. 30 tablet 3  . nystatin (MYCOSTATIN) 100000 UNIT/ML suspension Take 5 mLs (500,000 Units total) by mouth 4 (four) times daily. Swish and spit. 60 mL 0  . Omega-3 Fatty Acids (FISH OIL) 1000 MG CAPS Take 1 capsule by mouth daily.    . QUEtiapine (SEROQUEL XR) 400 MG 24 hr tablet TK 1 T PO QPM WF  0  . QUEtiapine (SEROQUEL) 50 MG tablet TK 1 T PO TID  3  . venlafaxine (EFFEXOR-XR) 75 MG 24 hr capsule Take 225 mg by mouth daily.     . beclomethasone (QVAR) 80 MCG/ACT inhaler Inhale 2 puffs into the lungs 2 (two) times daily. (Patient not taking: Reported on 01/14/2017) 1 Inhaler 0   No current facility-administered medications on file prior to visit.     Allergies  Allergen Reactions  . Nitrofurantoin Monohyd Macro Nausea Only    Severe headache  . Sulfa Antibiotics Nausea And Vomiting  . Alprazolam Anxiety and Other (See Comments)    Hyper   . Amoxicillin Rash  .  Prednisone Anxiety    Family History  Problem Relation Age of Onset  . Coronary artery disease Father   . Heart attack Father 31  . Heart disease Father   . Lung disease Mother     Mycobacterium avium intracellulare  . Hypertension Brother   . Hyperlipidemia Brother   . Anesthesia problems Neg Hx     Social History   Social History  . Marital status: Single    Spouse name: N/A  . Number of children: 0  . Years of education: 84   Occupational History  . retired//disability    Social History Main Topics  . Smoking status: Never Smoker  . Smokeless tobacco: Never Used  . Alcohol use 0.0 oz/week     Comment: Occasional glass of wine/"hardly ever"  . Drug use: No  . Sexual activity: No   Other Topics Concern  .  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   2 cups of coffee a day, Rare soda    Review of Systems - See HPI.  All other ROS are negative.  BP 130/88   Pulse 72   Temp 98.6 F (37 C) (Oral)   Resp 14   Ht 5\' 9"  (1.753 m)   Wt 234 lb (106.1 kg)   LMP 11/09/2000   SpO2 96%   BMI 34.56 kg/m   Physical Exam  Constitutional: She is oriented to person, place, and time. No distress.  HENT:  Right Ear: External ear normal. A middle ear effusion is present.  Left Ear: External ear normal.  No middle ear effusion.  Nose: Mucosal edema present. Right sinus exhibits no maxillary sinus tenderness and no frontal sinus tenderness. Left sinus exhibits no maxillary sinus tenderness and no frontal sinus tenderness.  Mouth/Throat: No oropharyngeal exudate or posterior oropharyngeal edema.  Eyes: Conjunctivae are normal. No scleral icterus.  Neck: Neck supple.  Cardiovascular: Normal rate, regular rhythm and normal heart sounds.  Exam reveals no gallop and no friction rub.   No murmur heard. Pulmonary/Chest: Effort normal and breath sounds normal. No respiratory distress. She has no wheezes. She has no rales.  Lymphadenopathy:    She has no cervical adenopathy.  Neurological: She is alert and oriented to person, place, and time.  Skin: Skin is warm and dry. She is not diaphoretic.  Psychiatric: Affect normal.    Recent Results (from the past 2160 hour(s))  WET PREP BY MOLECULAR PROBE     Status: None   Collection Time: 10/22/16 10:20 AM  Result Value Ref Range   Candida species NEG Negative   Trichomonas vaginosis NEG Negative   Gardnerella vaginalis NEG Negative  Hepatitis C antibody     Status: None   Collection Time: 10/22/16 10:23 AM  Result Value Ref Range   HCV Ab NEGATIVE NEGATIVE  HM MAMMOGRAPHY     Status: None   Collection Time: 11/06/16 12:00 AM  Result Value Ref Range   HM Mammogram 0-4 Bi-Rad 0-4 Bi-Rad, Self Reported Normal     Comment: bi-rads 2/[facility]    Assessment/Plan: 1. Community acquired pneumonia of right middle lobe of lung ([place]) Resolving clinically. ABX complete. Lungs CTAB. Vitals stable. Some nasal congestion still present. Discussed supportive measures and OTC medications. Continue Advair. Will transition back to Qvar over the next week.    Leeanne Rio, PA-C

## 2017-01-14 NOTE — Progress Notes (Signed)
Pre visit review using our clinic review tool, if applicable. No additional management support is needed unless otherwise documented below in the visit note. 

## 2017-01-19 DIAGNOSIS — R69 Illness, unspecified: Secondary | ICD-10-CM | POA: Diagnosis not present

## 2017-02-01 ENCOUNTER — Encounter: Payer: Self-pay | Admitting: Physician Assistant

## 2017-02-01 ENCOUNTER — Ambulatory Visit (INDEPENDENT_AMBULATORY_CARE_PROVIDER_SITE_OTHER): Payer: Medicare HMO | Admitting: Physician Assistant

## 2017-02-01 ENCOUNTER — Telehealth: Payer: Self-pay | Admitting: Family Medicine

## 2017-02-01 VITALS — BP 130/98 | HR 73 | Temp 99.1°F | Resp 14 | Ht 69.0 in | Wt 237.0 lb

## 2017-02-01 DIAGNOSIS — R000 Tachycardia, unspecified: Secondary | ICD-10-CM | POA: Diagnosis not present

## 2017-02-01 LAB — TSH: TSH: 0.79 m[IU]/L

## 2017-02-01 NOTE — Telephone Encounter (Signed)
Noted, pt has an appt with PA today.

## 2017-02-01 NOTE — Patient Instructions (Addendum)
Please keep well-hydrated and get plenty of rest.  Continue chronic medications as directed.  I will call you with your lab results.  Please follow-up with your psychiatrist and your counselor this week as scheduled to discuss further changes in bipolar medications.   I would limit caffeine. I feel your episode was related to stress/anxiety levels and when these are addressed you will be feeling better. If there is any recurrence of symptoms, would consider a Holter Monitor study. Also if BP is elevated at next visit, we may alter your regimen.   If you note any recurrence of symptom that is significant or there is any shortness of breath, fatigue or chest pain with symptoms, have someone carry you to the ER for assessment.    DASH Eating Plan DASH stands for "Dietary Approaches to Stop Hypertension." The DASH eating plan is a healthy eating plan that has been shown to reduce high blood pressure (hypertension). It may also reduce your risk for type 2 diabetes, heart disease, and stroke. The DASH eating plan may also help with weight loss. What are tips for following this plan? General guidelines   Avoid eating more than 2,300 mg (milligrams) of salt (sodium) a day. If you have hypertension, you may need to reduce your sodium intake to 1,500 mg a day.  Limit alcohol intake to no more than 1 drink a day for nonpregnant women and 2 drinks a day for men. One drink equals 12 oz of beer, 5 oz of wine, or 1 oz of hard liquor.  Work with your health care provider to maintain a healthy body weight or to lose weight. Ask what an ideal weight is for you.  Get at least 30 minutes of exercise that causes your heart to beat faster (aerobic exercise) most days of the week. Activities may include walking, swimming, or biking.  Work with your health care provider or diet and nutrition specialist (dietitian) to adjust your eating plan to your individual calorie needs. Reading food labels   Check food labels  for the amount of sodium per serving. Choose foods with less than 5 percent of the Daily Value of sodium. Generally, foods with less than 300 mg of sodium per serving fit into this eating plan.  To find whole grains, look for the word "whole" as the first word in the ingredient list. Shopping   Buy products labeled as "low-sodium" or "no salt added."  Buy fresh foods. Avoid canned foods and premade or frozen meals. Cooking   Avoid adding salt when cooking. Use salt-free seasonings or herbs instead of table salt or sea salt. Check with your health care provider or pharmacist before using salt substitutes.  Do not fry foods. Cook foods using healthy methods such as baking, boiling, grilling, and broiling instead.  Cook with heart-healthy oils, such as olive, canola, soybean, or sunflower oil. Meal planning    Eat a balanced diet that includes:  5 or more servings of fruits and vegetables each day. At each meal, try to fill half of your plate with fruits and vegetables.  Up to 6-8 servings of whole grains each day.  Less than 6 oz of lean meat, poultry, or fish each day. A 3-oz serving of meat is about the same size as a deck of cards. One egg equals 1 oz.  2 servings of low-fat dairy each day.  A serving of nuts, seeds, or beans 5 times each week.  Heart-healthy fats. Healthy fats called Omega-3 fatty acids are  found in foods such as flaxseeds and coldwater fish, like sardines, salmon, and mackerel.  Limit how much you eat of the following:  Canned or prepackaged foods.  Food that is high in trans fat, such as fried foods.  Food that is high in saturated fat, such as fatty meat.  Sweets, desserts, sugary drinks, and other foods with added sugar.  Full-fat dairy products.  Do not salt foods before eating.  Try to eat at least 2 vegetarian meals each week.  Eat more home-cooked food and less restaurant, buffet, and fast food.  When eating at a restaurant, ask that your  food be prepared with less salt or no salt, if possible. What foods are recommended? The items listed may not be a complete list. Talk with your dietitian about what dietary choices are best for you. Grains  Whole-grain or whole-wheat bread. Whole-grain or whole-wheat pasta. Brown rice. [name]. Bulgur. Whole-grain and low-sodium cereals. Pita bread. Low-fat, low-sodium crackers. Whole-wheat flour tortillas. Vegetables  Fresh or frozen vegetables (raw, steamed, roasted, or grilled). Low-sodium or reduced-sodium tomato and vegetable juice. Low-sodium or reduced-sodium tomato sauce and tomato paste. Low-sodium or reduced-sodium canned vegetables. Fruits  All fresh, dried, or frozen fruit. Canned fruit in natural juice (without added sugar). Meat and other protein foods  Skinless chicken or [place]. Ground chicken or [place]. Pork with fat trimmed off. Fish and seafood. Egg whites. Dried beans, peas, or lentils. Unsalted nuts, nut butters, and seeds. Unsalted canned beans. Lean cuts of beef with fat trimmed off. Low-sodium, lean deli meat. Dairy  Low-fat (1%) or fat-free (skim) milk. Fat-free, low-fat, or reduced-fat cheeses. Nonfat, low-sodium ricotta or cottage cheese. Low-fat or nonfat yogurt. Low-fat, low-sodium cheese. Fats and oils  Soft margarine without trans fats. Vegetable oil. Low-fat, reduced-fat, or light mayonnaise and salad dressings (reduced-sodium). Canola, safflower, olive, soybean, and sunflower oils. Avocado. Seasoning and other foods  Herbs. Spices. Seasoning mixes without salt. Unsalted popcorn and pretzels. Fat-free sweets. What foods are not recommended? The items listed may not be a complete list. Talk with your dietitian about what dietary choices are best for you. Grains  Baked goods made with fat, such as croissants, muffins, or some breads. Dry pasta or rice meal packs. Vegetables  Creamed or fried vegetables. Vegetables in a cheese sauce. Regular canned  vegetables (not low-sodium or reduced-sodium). Regular canned tomato sauce and paste (not low-sodium or reduced-sodium). Regular tomato and vegetable juice (not low-sodium or reduced-sodium). [name]. Olives. Fruits  Canned fruit in a light or heavy syrup. Fried fruit. Fruit in cream or butter sauce. Meat and other protein foods  Fatty cuts of meat. Ribs. Fried meat. Berniece Salines. Sausage. Bologna and other processed lunch meats. Salami. Fatback. Hotdogs. Bratwurst. Salted nuts and seeds. Canned beans with added salt. Canned or smoked fish. Whole eggs or egg yolks. Chicken or [place] with skin. Dairy  Whole or 2% milk, cream, and half-and-half. Whole or full-fat cream cheese. Whole-fat or sweetened yogurt. Full-fat cheese. Nondairy creamers. Whipped toppings. Processed cheese and cheese spreads. Fats and oils  Butter. Stick margarine. Lard. Shortening. Ghee. Bacon fat. Tropical oils, such as coconut, palm kernel, or palm oil. Seasoning and other foods  Salted popcorn and pretzels. Onion salt, garlic salt, seasoned salt, table salt, and sea salt. Worcestershire sauce. Tartar sauce. Barbecue sauce. Teriyaki sauce. Soy sauce, including reduced-sodium. Steak sauce. Canned and packaged gravies. Fish sauce. Oyster sauce. Cocktail sauce. Horseradish that you find on the shelf. Ketchup. Mustard. Meat flavorings and tenderizers. Bouillon cubes.  Hot sauce and Tabasco sauce. Premade or packaged marinades. Premade or packaged taco seasonings. Relishes. Regular salad dressings. Where to find more information:  National Heart, Lung, and [place]: https://wilson-eaton.com/  American Heart Association: www.heart.org Summary  The DASH eating plan is a healthy eating plan that has been shown to reduce high blood pressure (hypertension). It may also reduce your risk for type 2 diabetes, heart disease, and stroke.  With the DASH eating plan, you should limit salt (sodium) intake to 2,300 mg a day. If you have hypertension,  you may need to reduce your sodium intake to 1,500 mg a day.  When on the DASH eating plan, aim to eat more fresh fruits and vegetables, whole grains, lean proteins, low-fat dairy, and heart-healthy fats.  Work with your health care provider or diet and nutrition specialist (dietitian) to adjust your eating plan to your individual calorie needs. This information is not intended to replace advice given to you by your health care provider. Make sure you discuss any questions you have with your health care provider. Document Released: 10/15/2011 Document Revised: 10/19/2016 Document Reviewed: 10/19/2016 Elsevier Interactive Patient Education  2017 [employer].

## 2017-02-01 NOTE — Progress Notes (Signed)
Patient presents to clinic today increased heart rate noted yesterday. Patient states she was walking her dog in the park yesterday when symptoms began. Noted increased heart rate. States she finished the walk with the dog and went back to the car and noted symptoms continued. States she went home and relaxed, taking another of her Atenolol with resolution of symptoms. Denies increased caffeine intake. Denies alcohol consumption. Patient with history of mitral valve prolapse, hypertension, currently on a regimen of Atenolol 50 mg once daily. Last echocardiogram 2009, revealing mitral regurgitation without mitral valve prolapse. Of note, patient endorses significant increase in her anxiety and stress levels recently.  Is taking her psychiatric medications as directed but still noting increased anxiety. Denies SI/HI. Has follow-up with her therapist this Friday.  Past Medical History:  Diagnosis Date  . Allergic rhinitis   . Anxiety   . Arthritis    R knee, Left Knee  . Asthma   . Bipolar disorder ([place])   . Cataract   . Complication of anesthesia    had bad anxiety after anesthesia  . Constipation   . Constipation, chronic   . Depression   . Dysrhythmia    Palpations occ, takes Tenormin  . GERD (gastroesophageal reflux disease)   . Heart murmur   . Hyperlipidemia   . MVP (mitral valve prolapse)   . Urinary tract infection    frequent, see Urololgist 12/16/2010    Current Outpatient Prescriptions on File Prior to Visit  Medication Sig Dispense Refill  . albuterol (PROVENTIL HFA;VENTOLIN HFA) 108 (90 Base) MCG/ACT inhaler Inhale 2 puffs into the lungs every 6 (six) hours as needed for wheezing or shortness of breath. 1 Inhaler 0  . atenolol (TENORMIN) 50 MG tablet TAKE 1 TABLET BY MOUTH ONCE DAILY 180 tablet 0  . beclomethasone (QVAR) 80 MCG/ACT inhaler Inhale 2 puffs into the lungs 2 (two) times daily. 1 Inhaler 0  . clonazePAM (KLONOPIN) 2 MG tablet 1 mg 4 (four) times daily as needed.    0  . estrogens, conjugated, (PREMARIN) 0.625 MG tablet Take 1 tablet (0.625 mg total) by mouth daily. 30 tablet 13  . fenofibrate 160 MG tablet Take 1 tablet (160 mg total) by mouth daily. 90 tablet 1  . fluticasone (FLONASE) 50 MCG/ACT nasal spray Place 2 sprays into both nostrils daily. 16 g 0  . gabapentin (NEURONTIN) 600 MG tablet Take 600 mg by mouth 4 (four) times daily.     Marland Kitchen lamoTRIgine (LAMICTAL) 200 MG tablet Take 200 mg by mouth 2 (two) times daily.     . montelukast (SINGULAIR) 10 MG tablet Take 1 tablet (10 mg total) by mouth at bedtime. 30 tablet 3  . Omega-3 Fatty Acids (FISH OIL) 1000 MG CAPS Take 1 capsule by mouth daily.    . QUEtiapine (SEROQUEL XR) 400 MG 24 hr tablet TK 1 T PO QPM WF  0  . QUEtiapine (SEROQUEL) 50 MG tablet TK 1 T PO TID  3  . venlafaxine (EFFEXOR-XR) 75 MG 24 hr capsule Take 225 mg by mouth daily.     . Fluticasone-Salmeterol (ADVAIR DISKUS) 250-50 MCG/DOSE AEPB Inhale 1 puff into the lungs 2 (two) times daily. (Patient not taking: Reported on 02/01/2017) 60 each 0  . nystatin (MYCOSTATIN) 100000 UNIT/ML suspension Take 5 mLs (500,000 Units total) by mouth 4 (four) times daily. Swish and spit. (Patient not taking: Reported on 02/01/2017) 60 mL 0   No current facility-administered medications on file prior to visit.  Allergies  Allergen Reactions  . Nitrofurantoin Monohyd Macro Nausea Only    Severe headache  . Sulfa Antibiotics Nausea And Vomiting  . Alprazolam Anxiety and Other (See Comments)    Hyper   . Amoxicillin Rash  . Prednisone Anxiety    Family History  Problem Relation Age of Onset  . Coronary artery disease Father   . Heart attack Father 83  . Heart disease Father   . Lung disease Mother     Mycobacterium avium intracellulare  . Hypertension Brother   . Hyperlipidemia Brother   . Anesthesia problems Neg Hx     Social History   Social History  . Marital status: Single    Spouse name: N/A  . Number of children: 0  .  Years of education: 48   Occupational History  . retired//disability    Social History Main Topics  . Smoking status: Never Smoker  . Smokeless tobacco: Never Used  . Alcohol use 0.0 oz/week     Comment: Occasional glass of wine/"hardly ever"  . Drug use: No  . Sexual activity: No   Other Topics Concern  . 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   2 cups of coffee a day, Rare soda    Review of Systems - See HPI.  All other ROS are negative.  BP (!) 130/98 (BP Location: Left Arm)   Pulse 73   Temp 99.1 F (37.3 C) (Oral)   Resp 14   Ht 5\' 9"  (1.753 m)   Wt 237 lb (107.5 kg)   LMP 11/09/2000   SpO2 98%   BMI 35.00 kg/m   Physical Exam  Constitutional: She is oriented to person, place, and time and well-developed, well-nourished, and in no distress.  HENT:  Head: Normocephalic and atraumatic.  Eyes: Conjunctivae are normal.  Neck: Neck supple.  Cardiovascular: Normal rate, regular rhythm, normal heart sounds and intact distal pulses.   Pulmonary/Chest: Effort normal and breath sounds normal. No respiratory distress. She has no wheezes. She has no rales. She exhibits no tenderness.  Neurological: She is alert and oriented to person, place, and time.  Skin: Skin is warm and dry. No rash noted.  Psychiatric: Affect normal.  Vitals reviewed.  Recent Results (from the past 2160 hour(s))  [facility] MAMMOGRAPHY     Status: None   Collection Time: 11/06/16 12:00 AM  Result Value Ref Range   HM Mammogram 0-4 Bi-Rad 0-4 Bi-Rad, Self Reported Normal    Comment: bi-rads 2/SOLIS  TSH     Status: None   Collection Time: 02/01/17  4:17 PM  Result Value Ref Range   TSH 0.79 mIU/L    Comment:   Reference Range   > or = 20 Years  0.40-4.50   Pregnancy Range First trimester  0.26-2.66 Second trimester 0.55-2.73 Third trimester  0.43-2.91       Assessment/Plan: 1. Tachycardia EKG with NSR. No change compared to prior tracings.  Will check TSH today giving symptoms. Heart rate stable today. DBP increased today. Suspect secondary to her anxiety. Discussed avoidance of caffeine and alcohol. She is to work on [employer]. Follow-up with counseling this Friday. Also patient to schedule follow-up with psychiatry to discuss medication management for anxiety. If there is any recurrence, will get Holter study to further assess. No alarm signs/symptoms during episode. Discussed alarm signs/symptoms that would prompt ER assessment. F/U 1 week.  - EKG 12-Lead - TSH; Future - TSH  Leeanne Rio, PA-C

## 2017-02-01 NOTE — Progress Notes (Signed)
Pre visit review using our clinic review tool, if applicable. No additional management support is needed unless otherwise documented below in the visit note. 

## 2017-02-01 NOTE — Telephone Encounter (Signed)
[facility]  Patient Name: [name]  DOB: 1956-09-22    Initial Comment Caller states she has racing heart starting yesterday   Nurse Assessment  Nurse: Wynetta Emery, RN, [name] Date/Time (Eastern Time): 02/01/2017 8:52:32 AM  Confirm and document reason for call. If symptomatic, describe symptoms. ---[name] started yesterday the beat of her heart beating fast has mitral valve prolapse -- back to normal today  Does the patient have any new or worsening symptoms? ---Yes  Will a triage be completed? ---Yes  Related visit to physician within the last 2 weeks? ---No  Does the PT have any chronic conditions? (i.e. diabetes, asthma, etc.) ---Yes  List chronic conditions. ---Mitral valve prolapse  Is this a behavioral health or substance abuse call? ---No     Guidelines    Guideline Title Affirmed Question Affirmed Notes  Heart Rate and Heartbeat Questions [1] Heart beating very rapidly (e.g., > 140 / minute) AND [2] not present now (Exception: during exercise)    Final Disposition User   See Physician within 4 Hours (or PCP triage) Wynetta Emery, RN, [name]    Comments  NOTE given appt with Elyn Aquas PA unable to schedule with [name] today 02/01/2017 300pm with Einar Pheasant   Referrals  REFERRED TO PCP OFFICE   Disagree/Comply: Leta Baptist

## 2017-02-02 ENCOUNTER — Encounter: Payer: Self-pay | Admitting: Neurology

## 2017-02-02 ENCOUNTER — Telehealth: Payer: Self-pay | Admitting: Family Medicine

## 2017-02-02 NOTE — Telephone Encounter (Signed)
Maudie Mercury can you please talk to pt? And see if she needs to be seen. We cannot work her in as Birdie Riddle has an appt for her child today. Einar Pheasant says labs are normal and most of her symptoms seem to stem from anxiety.

## 2017-02-02 NOTE — Telephone Encounter (Signed)
SW patient regarding symptoms. Patient states she woke up with her heart pounding and her BP was elevated this morning when checked at [facility]. Patient states she doesn't feel that symptoms are anxiety related and is concerned about her blood pressure. Patient denies chest pain or SOB. Advised to continue prescribed medications and monitor home blood pressures at the same time daily, bring readings to appointment next week. Discussed low sodium diet and relaxation methods. Patient advised to go to Emergency Department or Urgent Care if she experiences chest pain, SOB, or feels she needs to be evaluated emergently. Patient verbalized understanding.

## 2017-02-02 NOTE — Telephone Encounter (Signed)
Patient Name: [name]  DOB: 05-Feb-1956    Initial Comment Caller states yesterday her blood pressure was high an went to doctor. She said it is elevated today. Currently blood pressure is 146/98. woke up at 5:30 am an heart rate seemed elevated an she was sweaty.   Nurse Assessment  Nurse: Mallie Mussel, RN, Alveta Heimlich Date/Time Eilene Ghazi Time): 02/02/2017 8:43:51 AM  Confirm and document reason for call. If symptomatic, describe symptoms. ---Caller states that her BP was 146/98 about an hour ago. Her heart rate was elevated earlier, it woke her up from sleep. She does not think it is beating that fast now. She was seen yesterday. She takes Atenolol but it doesn't seem to be helping. She denies headache. She states that she just doesn't feel right. She is fatigued. She is not dizzy upon standing.  Does the patient have any new or worsening symptoms? ---Yes  Will a triage be completed? ---Yes  Related visit to physician within the last 2 weeks? ---Yes  Does the PT have any chronic conditions? (i.e. diabetes, asthma, etc.) ---Yes  List chronic conditions. ---HTN, Hypercholesterolemia, Asthma  Is this a behavioral health or substance abuse call? ---No     Guidelines    Guideline Title Affirmed Question Affirmed Notes  High Blood Pressure [1] BP ? 140/90 AND [2] taking BP medications    Final Disposition User   See PCP within [place], RN, Alveta Heimlich    Comments  Caller states that she wants to be seen today.  [name] does not have any appointments available today. She is requesting a call back to see if they can work her in today. She was seen by PA Cody yesterday. I do not see a Einar Pheasant on our list of providers for this facility.   Referrals  REFERRED TO PCP OFFICE   Disagree/Comply: Comply

## 2017-02-04 DIAGNOSIS — R69 Illness, unspecified: Secondary | ICD-10-CM | POA: Diagnosis not present

## 2017-02-08 ENCOUNTER — Ambulatory Visit (INDEPENDENT_AMBULATORY_CARE_PROVIDER_SITE_OTHER): Payer: Medicare HMO | Admitting: Physician Assistant

## 2017-02-08 ENCOUNTER — Ambulatory Visit (INDEPENDENT_AMBULATORY_CARE_PROVIDER_SITE_OTHER): Payer: Medicare HMO

## 2017-02-08 ENCOUNTER — Telehealth: Payer: Self-pay | Admitting: Obstetrics & Gynecology

## 2017-02-08 ENCOUNTER — Encounter: Payer: Self-pay | Admitting: Physician Assistant

## 2017-02-08 VITALS — BP 146/90 | HR 66 | Temp 99.6°F | Resp 16 | Ht 69.0 in | Wt 241.0 lb

## 2017-02-08 DIAGNOSIS — B9789 Other viral agents as the cause of diseases classified elsewhere: Secondary | ICD-10-CM

## 2017-02-08 DIAGNOSIS — J069 Acute upper respiratory infection, unspecified: Secondary | ICD-10-CM

## 2017-02-08 DIAGNOSIS — R002 Palpitations: Secondary | ICD-10-CM

## 2017-02-08 DIAGNOSIS — I10 Essential (primary) hypertension: Secondary | ICD-10-CM

## 2017-02-08 DIAGNOSIS — J9811 Atelectasis: Secondary | ICD-10-CM | POA: Diagnosis not present

## 2017-02-08 LAB — CBC WITH DIFFERENTIAL/PLATELET
BASOS PCT: 0.1 % (ref 0.0–3.0)
Basophils Absolute: 0 10*3/uL (ref 0.0–0.1)
EOS ABS: 0 10*3/uL (ref 0.0–0.7)
Eosinophils Relative: 0 % (ref 0.0–5.0)
HEMATOCRIT: 37.4 % (ref 36.0–46.0)
Hemoglobin: 12.6 g/dL (ref 12.0–15.0)
LYMPHS ABS: 1.2 10*3/uL (ref 0.7–4.0)
LYMPHS PCT: 27.1 % (ref 12.0–46.0)
MCHC: 33.7 g/dL (ref 30.0–36.0)
MCV: 92.1 fl (ref 78.0–100.0)
Monocytes Absolute: 0.3 10*3/uL (ref 0.1–1.0)
Monocytes Relative: 6.4 % (ref 3.0–12.0)
NEUTROS ABS: 3.1 10*3/uL (ref 1.4–7.7)
NEUTROS PCT: 66.4 % (ref 43.0–77.0)
PLATELETS: 208 10*3/uL (ref 150.0–400.0)
RBC: 4.06 Mil/uL (ref 3.87–5.11)
RDW: 13.9 % (ref 11.5–15.5)
WBC: 4.6 10*3/uL (ref 4.0–10.5)

## 2017-02-08 MED ORDER — HYDROCHLOROTHIAZIDE 12.5 MG PO TABS: 12.5000 mg | ORAL_TABLET | Freq: Every day | ORAL | 1 refills | Status: DC

## 2017-02-08 NOTE — Patient Instructions (Signed)
Please stay well hydrated and get plenty of rest. Again, continue to limit high salt foods. Limit caffeine intake. Heart rate looks good but your BP are staying too elevated.  Continue the Atenolol but I have added another medication (HCTZ) to your regimen. Take as directed.  Use some saline nasal spray to flush out nasal passages. Use Flonase to help with nasal swelling and fluid behind ears. Lungs sound good but giving low-grade fever, recent pneumonia and current fatigue, I am getting an x-ray today to make sure lungs are completely clear.   We will alter regimen accordingly. Follow-up with [name] in 1 week. You will be contacted by Cardiology for further assessment.

## 2017-02-08 NOTE — Progress Notes (Signed)
Pre visit review using our clinic review tool, if applicable. No additional management support is needed unless otherwise documented below in the visit note. 

## 2017-02-08 NOTE — Telephone Encounter (Signed)
Patient called re: Premarin .625 mg needs prior authorization. Patient bought two pills our of pocket without insurance while this is being worked on.  Pharmacy on file is correct.

## 2017-02-08 NOTE — Progress Notes (Signed)
Patient presents to clinic today for follow-up of hypertension and episode of racing heart. Patient has continued the Atenolol as directed. When she has checked pulse at home, has noted it in 60-70s. Has had one other episode of racing heart occurring a couple of nights a go. Again no chest pain, lightheadedness, dizziness. Has been having pharmacist check BP manually. BP noted at 164/96 (3/27), 160/91 (3/28), 159/97 (3/30) and 180/100 (4/1). Patient has seen her therapist last week who she says agrees that anxiety is contributing factor. They have moved her appointment up with Psychiatry for medication management. Should be seeing them this Thursday. Of not, patient notes fatigue over the past couple of days with some mild ear pressure and nasal congestion. Denies cough or chest congestion. Denies fever, chills or aches. Was recently treated for CAP last month.  Past Medical History:  Diagnosis Date  . Allergic rhinitis   . Anxiety   . Arthritis    R knee, Left Knee  . Asthma   . Bipolar disorder ([place])   . Cataract   . Complication of anesthesia    had bad anxiety after anesthesia  . Constipation   . Constipation, chronic   . Depression   . Dysrhythmia    Palpations occ, takes Tenormin  . GERD (gastroesophageal reflux disease)   . Heart murmur   . Hyperlipidemia   . MVP (mitral valve prolapse)   . Urinary tract infection    frequent, see Urololgist 12/16/2010    Current Outpatient Prescriptions on File Prior to Visit  Medication Sig Dispense Refill  . albuterol (PROVENTIL HFA;VENTOLIN HFA) 108 (90 Base) MCG/ACT inhaler Inhale 2 puffs into the lungs every 6 (six) hours as needed for wheezing or shortness of breath. 1 Inhaler 0  . atenolol (TENORMIN) 50 MG tablet TAKE 1 TABLET BY MOUTH ONCE DAILY 180 tablet 0  . beclomethasone (QVAR) 80 MCG/ACT inhaler Inhale 2 puffs into the lungs 2 (two) times daily. 1 Inhaler 0  . clonazePAM (KLONOPIN) 2 MG tablet 1 mg 4 (four) times daily as  needed.   0  . estrogens, conjugated, (PREMARIN) 0.625 MG tablet Take 1 tablet (0.625 mg total) by mouth daily. 30 tablet 13  . fenofibrate 160 MG tablet Take 1 tablet (160 mg total) by mouth daily. 90 tablet 1  . fluticasone (FLONASE) 50 MCG/ACT nasal spray Place 2 sprays into both nostrils daily. 16 g 0  . gabapentin (NEURONTIN) 600 MG tablet Take 600 mg by mouth 4 (four) times daily.     Marland Kitchen lamoTRIgine (LAMICTAL) 200 MG tablet Take 200 mg by mouth 2 (two) times daily.     . montelukast (SINGULAIR) 10 MG tablet Take 1 tablet (10 mg total) by mouth at bedtime. 30 tablet 3  . nystatin (MYCOSTATIN) 100000 UNIT/ML suspension Take 5 mLs (500,000 Units total) by mouth 4 (four) times daily. Swish and spit. 60 mL 0  . Omega-3 Fatty Acids (FISH OIL) 1000 MG CAPS Take 1 capsule by mouth daily.    . QUEtiapine (SEROQUEL XR) 400 MG 24 hr tablet TK 1 T PO QPM WF  0  . QUEtiapine (SEROQUEL) 50 MG tablet TK 1 T PO TID  3  . venlafaxine (EFFEXOR-XR) 75 MG 24 hr capsule Take 225 mg by mouth daily.      No current facility-administered medications on file prior to visit.     Allergies  Allergen Reactions  . Nitrofurantoin Monohyd Macro Nausea Only    Severe headache  . Sulfa Antibiotics  Nausea And Vomiting  . Alprazolam Anxiety and Other (See Comments)    Hyper   . Amoxicillin Rash  . Prednisone Anxiety    Family History  Problem Relation Age of Onset  . Coronary artery disease Father   . Heart attack Father 53  . Heart disease Father   . Lung disease Mother     Mycobacterium avium intracellulare  . Hypertension Brother   . Hyperlipidemia Brother   . Anesthesia problems Neg Hx     Social History   Social History  . Marital status: Single    Spouse name: N/A  . Number of children: 0  . Years of education: 58   Occupational History  . retired//disability    Social History Main Topics  . Smoking status: Never Smoker  . Smokeless tobacco: Never Used  . Alcohol use 0.0 oz/week      Comment: Occasional glass of wine/"hardly ever"  . Drug use: No  . Sexual activity: No   Other Topics Concern  . 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   2 cups of coffee a day, Rare soda    Review of Systems - See HPI.  All other ROS are negative.  BP (!) 146/90 (BP Location: Right Arm, Cuff Size: Normal)   Pulse 66   Temp 99.6 F (37.6 C) (Oral)   Resp 16   Ht 5\' 9"  (1.753 m)   Wt 241 lb (109.3 kg)   LMP 11/09/2000   SpO2 97%   BMI 35.59 kg/m   Physical Exam  Constitutional: She is oriented to person, place, and time and well-developed, well-nourished, and in no distress.  HENT:  Head: Normocephalic and atraumatic.  Right Ear: External ear and ear canal normal. Tympanic membrane is not erythematous and not bulging. A middle ear effusion is present.  Left Ear: Tympanic membrane, external ear and ear canal normal. Tympanic membrane is not erythematous and not bulging.  Nose: Mucosal edema and rhinorrhea present. Right sinus exhibits no maxillary sinus tenderness and no frontal sinus tenderness. Left sinus exhibits no maxillary sinus tenderness and no frontal sinus tenderness.  Mouth/Throat: Uvula is midline, oropharynx is clear and moist and mucous membranes are normal.  Eyes: Conjunctivae are normal.  Neck: Neck supple.  Cardiovascular: Normal rate, regular rhythm, normal heart sounds and intact distal pulses.   Pulmonary/Chest: Effort normal and breath sounds normal. No respiratory distress. She has no wheezes. She has no rales. She exhibits no tenderness.  Lymphadenopathy:    She has no cervical adenopathy.  Neurological: She is alert and oriented to person, place, and time.  Skin: Skin is warm and dry. No rash noted.  Psychiatric: Affect normal.  Vitals reviewed.  Recent Results (from the past 2160 hour(s))  TSH     Status: None   Collection Time: 02/01/17  4:17 PM  Result Value Ref Range   TSH 0.79 mIU/L    Comment:     Reference Range   > or = 20 Years  0.40-4.50   Pregnancy Range First trimester  0.26-2.66 Second trimester 0.55-2.73 Third trimester  0.43-2.91       Assessment/Plan: 1. Intermittent palpitations Heart rate stable. Still feel anxiety is a major contributor. Has appt with Psychiatry. Will set up with Cardiology for further assessment and Holter Study. - Ambulatory referral to Cardiology  2. Viral URI CXR today to r/o recurrence of pneumonia giving fever and fatigue although on exam lungs seem  CTAB. Supportive measures and OTC medications reviewed. Will alter treatment base don CXR and labs.   - CBC w/Diff - DG Chest 2 View; Future  3. Essential hypertension Will add on HCTZ 12.5 mg. Continue Atenolol. Continue DASH diet. Follow-up 1 week.  Alarm signs/symptoms reviewed that would warrant ER assessment.  Patient voices understanding and agreement with plan.  - hydrochlorothiazide (HYDRODIURIL) 12.5 MG tablet; Take 1 tablet (12.5 mg total) by mouth daily.  Dispense: 30 tablet; Refill: 1   Leeanne Rio, [place]

## 2017-02-09 ENCOUNTER — Ambulatory Visit: Payer: Medicare HMO | Admitting: Neurology

## 2017-02-11 ENCOUNTER — Other Ambulatory Visit: Payer: Self-pay | Admitting: Family Medicine

## 2017-02-11 NOTE — Telephone Encounter (Signed)
PA for Premarin 0.625 mg tablets has been submitted via Cover my meds. Awaiting response from insurance company on approval or denial.

## 2017-02-11 NOTE — Telephone Encounter (Signed)
Spoke with patient. Advised PA has been submitted and is pending review with insurance company for approval or denial. Advised as soon as our office is notified she will be contacted. Patient is agreeable.

## 2017-02-11 NOTE — Telephone Encounter (Signed)
Patient calling to check status of medication authorization.

## 2017-02-15 ENCOUNTER — Encounter: Payer: Self-pay | Admitting: Family Medicine

## 2017-02-15 ENCOUNTER — Ambulatory Visit (INDEPENDENT_AMBULATORY_CARE_PROVIDER_SITE_OTHER): Payer: Medicare HMO | Admitting: Family Medicine

## 2017-02-15 DIAGNOSIS — I10 Essential (primary) hypertension: Secondary | ICD-10-CM | POA: Diagnosis not present

## 2017-02-15 MED ORDER — METOPROLOL SUCCINATE ER 50 MG PO TB24: 50.0000 mg | ORAL_TABLET | Freq: Every day | ORAL | 3 refills | Status: DC

## 2017-02-15 NOTE — Progress Notes (Signed)
   Subjective:    Patient ID: Waynette Buttery, female    DOB: 28-Sep-1956, 61 y.o.   MRN: [id number]  HPI HTN- pt was started on HCTZ at last visit.  BP is better than previous but still mildly elevated.  Pt reports feeling 'wiped out' b/c her heart was racing last night.  Already on Atenolol.  Continues to have intermittent HAs.     Review of Systems For ROS see HPI     Objective:   Physical Exam  Constitutional: She is oriented to person, place, and time. She appears well-developed and well-nourished. No distress.  HENT:  Head: Normocephalic and atraumatic.  Eyes: Conjunctivae and EOM are normal. Pupils are equal, round, and reactive to light.  Neck: Normal range of motion. Neck supple. No thyromegaly present.  Cardiovascular: Normal rate, regular rhythm, normal heart sounds and intact distal pulses.   No murmur heard. Pulmonary/Chest: Effort normal and breath sounds normal. No respiratory distress.  Abdominal: Soft. She exhibits no distension. There is no tenderness.  Musculoskeletal: She exhibits no edema.  Lymphadenopathy:    She has no cervical adenopathy.  Neurological: She is alert and oriented to person, place, and time.  Skin: Skin is warm and dry.  Psychiatric: She has a normal mood and affect. Her behavior is normal.  Vitals reviewed.         Assessment & Plan:

## 2017-02-15 NOTE — Assessment & Plan Note (Signed)
Chronic problem.  BP is better on HCTZ but still not at goal.  Due to her report of palpitations, will switch Atenolol toe Metoprolol prior to her cardiology appt.  Reviewed lifestyle and dietary modifications.  Will follow.

## 2017-02-15 NOTE — Patient Instructions (Signed)
Follow up in 1 month to recheck BP STOP the Atenolol START the Metoprolol once daily CONTINUE the HCTZ once daily Try and limit your salt intake Drink plenty of fluids Call with any questions or concerns Hang in there!!!

## 2017-02-16 NOTE — Telephone Encounter (Signed)
Left detailed message at number provided [phone], okay per ROI. Advised received notification from [place] that PA for Premarin has been approved until 11/08/2017. Advised to return call to the office with any questions. Approval letter to scan.  Routing to provider for final review. Patient agreeable to disposition. Will close encounter.

## 2017-02-17 ENCOUNTER — Ambulatory Visit: Payer: Medicare HMO | Admitting: Family Medicine

## 2017-02-17 ENCOUNTER — Telehealth: Payer: Self-pay

## 2017-02-17 NOTE — Telephone Encounter (Signed)
Spoke with patient at time of incoming call. Patient sates she contacted her pharmacy and is still unable to fill Premarin tablets. Requesting I call the pharmacy to discuss PA approval to process rx. [facility] [address]. Advised of PA approval for Premarin. Rx for Premarin was able to be processed. Call to patient advised rx can be processed and picked up at this time. Patient states that she has been off of the medication for a week and a half and is doing very well. States she discussed coming off this medication with Dr.Miller at her last visit and thinks she would like to stop taking at this time. Will notify the office if she decides to fill rx.  Routing to provider for final review. Patient agreeable to disposition. Will close encounter.

## 2017-02-18 ENCOUNTER — Other Ambulatory Visit: Payer: Self-pay | Admitting: *Deleted

## 2017-02-18 ENCOUNTER — Other Ambulatory Visit: Payer: Self-pay | Admitting: Physician Assistant

## 2017-02-18 DIAGNOSIS — R69 Illness, unspecified: Secondary | ICD-10-CM | POA: Diagnosis not present

## 2017-02-18 NOTE — Telephone Encounter (Signed)
Pharmacy notified PA has been approved, but patient does not want premarin refill at this time.

## 2017-02-22 ENCOUNTER — Telehealth: Payer: Self-pay | Admitting: *Deleted

## 2017-02-22 NOTE — Telephone Encounter (Signed)
PA started through Cover My Meds   [facility]  Awaiting approval

## 2017-03-01 NOTE — Telephone Encounter (Signed)
Insurance approved 11/07/16-11/08/17  Pharmacy was contacted via telephone and they will call the patient to let her know.   Approval # [id number]

## 2017-03-10 ENCOUNTER — Ambulatory Visit (INDEPENDENT_AMBULATORY_CARE_PROVIDER_SITE_OTHER): Payer: Medicare HMO | Admitting: Interventional Cardiology

## 2017-03-10 ENCOUNTER — Ambulatory Visit: Payer: Medicare HMO | Admitting: Neurology

## 2017-03-10 ENCOUNTER — Encounter: Payer: Self-pay | Admitting: Interventional Cardiology

## 2017-03-10 VITALS — BP 124/68 | HR 95 | Ht 69.0 in | Wt 233.9 lb

## 2017-03-10 DIAGNOSIS — Z8249 Family history of ischemic heart disease and other diseases of the circulatory system: Secondary | ICD-10-CM

## 2017-03-10 DIAGNOSIS — R002 Palpitations: Secondary | ICD-10-CM

## 2017-03-10 DIAGNOSIS — I10 Essential (primary) hypertension: Secondary | ICD-10-CM | POA: Diagnosis not present

## 2017-03-10 NOTE — Progress Notes (Signed)
Cardiology Office Note   Date:  03/10/2017   ID:  [name], DOB 1956-06-10, MRN [id number]  PCP:  Annye Asa, MD    No chief complaint on file.  palpitations  Wt Readings from Last 3 Encounters:  03/10/17 233 lb 14.4 oz (106.1 kg)  02/15/17 233 lb 2 oz (105.7 kg)  02/08/17 241 lb (109.3 kg)       History of Present Illness: [name] is a 61 y.o. female who is being seen today for the evaluation of palpitations at the request of Brunetta Jeans, PA-C.  She has had a h/o MVP for years.  She was treated with atenolol for many years.    She had some higher blood pressures and palpitations.  Her atenolol was switched to metoprolol and HCTZ was started for BP control.  SHe had some repratory problems in March.  SHe has had a lot of stress  No palpitations in the past few weeks.  She has been walking, but inconsistently.  She has lost weight since her illness in March.  BP has been checked at her friend's house.  It was 116/74.      Past Medical History:  Diagnosis Date  . Allergic rhinitis   . Anxiety   . Arthritis    R knee, Left Knee  . Asthma   . Bipolar disorder ([place])   . Cataract   . Complication of anesthesia    had bad anxiety after anesthesia  . Constipation   . Constipation, chronic   . Depression   . Dysrhythmia    Palpations occ, takes Tenormin  . GERD (gastroesophageal reflux disease)   . Heart murmur   . Hyperlipidemia   . MVP (mitral valve prolapse)   . Urinary tract infection    frequent, see Urololgist 12/16/2010    Past Surgical History:  Procedure Laterality Date  . ABDOMINAL HYSTERECTOMY  2002   no oophorectomy  . BACK SURGERY  2001   L5 - S1  . NASAL SINUS SURGERY  01/2010   x2  . TONSILLECTOMY    . TOTAL KNEE ARTHROPLASTY  12/23/2011   Procedure: TOTAL KNEE ARTHROPLASTY;  Surgeon: Ninetta Lights, MD;  Location: [facility];  Service: Orthopedics;  Laterality: Right;  DR MURPHY WANTS 120 MINUTES FOR SURGERY  . TOTAL KNEE  ARTHROPLASTY  08/31/2012   left knee  . TOTAL KNEE ARTHROPLASTY  08/31/2012   Procedure: TOTAL KNEE ARTHROPLASTY;  Surgeon: Ninetta Lights, MD;  Location: [facility];  Service: Orthopedics;  Laterality: Left;     Current Outpatient Prescriptions  Medication Sig Dispense Refill  . clonazePAM (KLONOPIN) 2 MG tablet 1 mg 4 (four) times daily as needed.   0  . gabapentin (NEURONTIN) 600 MG tablet Take 600 mg by mouth 4 (four) times daily.     . hydrochlorothiazide (HYDRODIURIL) 12.5 MG tablet Take 1 tablet (12.5 mg total) by mouth daily. 30 tablet 1  . lamoTRIgine (LAMICTAL) 200 MG tablet Take 200 mg by mouth 2 (two) times daily.     . metoprolol succinate (TOPROL-XL) 50 MG 24 hr tablet Take 1 tablet (50 mg total) by mouth daily. Take with or immediately following a meal. 30 tablet 3  . Omega-3 Fatty Acids (FISH OIL) 1000 MG CAPS Take 1 capsule by mouth daily.    . QUEtiapine (SEROQUEL XR) 400 MG 24 hr tablet TK 1 T PO QPM WF  0  . QUEtiapine (SEROQUEL) 50 MG tablet TK 1 T  PO TID  3  . QVAR 80 MCG/ACT inhaler INHALE 2 PUFFS INTO THE LUNGS 2 (TWO) TIMES DAILY. 8.7 g 0  . venlafaxine (EFFEXOR-XR) 75 MG 24 hr capsule Take 225 mg by mouth daily.      No current facility-administered medications for this visit.     Allergies:   Nitrofurantoin monohyd macro; Sulfa antibiotics; Alprazolam; Amoxicillin; and Prednisone    Social History:  The patient  reports that she has never smoked. She has never used smokeless tobacco. She reports that she drinks alcohol. She reports that she does not use drugs.   Family History:  The patient's family history includes Coronary artery disease in her father; Heart attack (age of onset: 37) in her father; Heart disease in her father; Hyperlipidemia in her brother; Hypertension in her brother; Lung disease in her mother.    ROS:  Please see the history of present illness.   Otherwise, review of systems are positive for recent respiratory illness.   All other systems  are reviewed and negative.    PHYSICAL EXAM: VS:  BP 124/68   Pulse 95   Ht 5\' 9"  (1.753 m)   Wt 233 lb 14.4 oz (106.1 kg)   LMP 11/09/2000   SpO2 95%   BMI 34.54 kg/m  , BMI Body mass index is 34.54 kg/m. GEN: Well nourished, well developed, in no acute distress  HEENT: normal  Neck: no JVD, carotid bruits, or masses Cardiac: RRR; no murmurs, rubs, or gallops,no edema  Respiratory:  clear to auscultation bilaterally, normal work of breathing GI: soft, nontender, nondistended, + BS MS: no deformity or atrophy  Skin: warm and dry, no rash Neuro:  Strength and sensation are intact Psych: euthymic mood, full affect   EKG:   The ekg ordered in March 2018 demonstrates normal ECG   Recent Labs: 08/17/2016: ALT 14; BUN 12; Creatinine, Ser 0.57; Potassium 4.2; Sodium 140 02/01/2017: TSH 0.79 02/08/2017: Hemoglobin 12.6; Platelets 208.0   Lipid Panel    Component Value Date/Time   CHOL 271 (H) 08/17/2016 1002   CHOL 312 (H) 06/19/2015 0924   TRIG 359.0 (H) 08/17/2016 1002   TRIG 657 (H) 06/19/2015 0924   HDL 50.40 08/17/2016 1002   HDL 44 06/19/2015 0924   CHOLHDL 5 08/17/2016 1002   VLDL 71.8 (H) 08/17/2016 1002   LDLCALC Comment 06/19/2015 0924   LDLDIRECT 164.0 08/17/2016 1002     Other studies Reviewed: Additional studies/ records that were reviewed today with results demonstrating: Notes from PMD.   ASSESSMENT AND PLAN:  1. Palpitations:  No symptoms in the last few weeks. I think a an outpatient monitor would be low yield. Therefore, would only consider monitoring if she has worsening of symptoms. 2. Family h/o early CAD: Father with an MI at an early age. We talked about risk factor modification including changing her diet to limit simple carbohydrates. Weight loss would also be beneficial. 3. MVP: Diagnosed 30 years ago.  Treated with atenolol.  Exam is normal.  No need for SBE prophylaxis based on more recent recommendations.   No history of endcarditis.  No prior  heart valve surgery.  4. HTN: BP well controlled.  Continue current meds. Lifestyle changes will also be helpful.   Current medicines are reviewed at length with the patient today.  The patient concerns regarding her medicines were addressed.  The following changes have been made:  No change  Labs/ tests ordered today include:  No orders of the defined types were  placed in this encounter.   Recommend 150 minutes/week of aerobic exercise; walking would be a good start for her Low fat, low carb, high fiber diet recommended  Disposition:   FU prn; she will let us know if sx worsen   Signed, Larae Grooms, MD  03/10/2017 2:32 PM    [facility] [place], [place], [place]  [address] Phone: [phone]; Fax: [phone]

## 2017-03-10 NOTE — Patient Instructions (Signed)
Medication Instructions:  Your physician recommends that you continue on your current medications as directed. Please refer to the Current Medication list given to you today.   Labwork: None ordered  Testing/Procedures: None ordered  Follow-Up: As needed  Any Other Special Instructions Will Be Listed Below (If Applicable).     If you need a refill on your cardiac medications before your next appointment, please call your pharmacy.  

## 2017-03-11 DIAGNOSIS — R69 Illness, unspecified: Secondary | ICD-10-CM | POA: Diagnosis not present

## 2017-03-15 ENCOUNTER — Encounter: Payer: Self-pay | Admitting: Family Medicine

## 2017-03-15 ENCOUNTER — Ambulatory Visit (INDEPENDENT_AMBULATORY_CARE_PROVIDER_SITE_OTHER): Payer: Medicare HMO | Admitting: Family Medicine

## 2017-03-15 VITALS — BP 123/83 | HR 85 | Resp 18 | Ht 69.0 in | Wt 233.0 lb

## 2017-03-15 DIAGNOSIS — I10 Essential (primary) hypertension: Secondary | ICD-10-CM

## 2017-03-15 DIAGNOSIS — E785 Hyperlipidemia, unspecified: Secondary | ICD-10-CM

## 2017-03-15 LAB — BASIC METABOLIC PANEL
BUN: 17 mg/dL (ref 6–23)
CO2: 28 mEq/L (ref 19–32)
CREATININE: 0.65 mg/dL (ref 0.40–1.20)
Calcium: 9.9 mg/dL (ref 8.4–10.5)
Chloride: 99 mEq/L (ref 96–112)
GFR: 98.96 mL/min (ref >60.00)
Glucose, Bld: 98 mg/dL (ref 70–99)
Potassium: 4.4 mEq/L (ref 3.5–5.1)
Sodium: 136 mEq/L (ref 135–145)

## 2017-03-15 LAB — CBC WITH DIFFERENTIAL/PLATELET
BASOS ABS: 0 10*3/uL (ref 0.0–0.1)
Basophils Relative: 0.4 % (ref 0.0–3.0)
EOS PCT: 0 % (ref 0.0–5.0)
Eosinophils Absolute: 0 10*3/uL (ref 0.0–0.7)
HEMATOCRIT: 39.7 % (ref 36.0–46.0)
Hemoglobin: 13.5 g/dL (ref 12.0–15.0)
LYMPHS ABS: 1.4 10*3/uL (ref 0.7–4.0)
LYMPHS PCT: 37.7 % (ref 12.0–46.0)
MCHC: 33.9 g/dL (ref 30.0–36.0)
MCV: 90.3 fl (ref 78.0–100.0)
MONOS PCT: 8.5 % (ref 3.0–12.0)
Monocytes Absolute: 0.3 10*3/uL (ref 0.1–1.0)
NEUTROS ABS: 2 10*3/uL (ref 1.4–7.7)
Neutrophils Relative %: 53.4 % (ref 43.0–77.0)
PLATELETS: 200 10*3/uL (ref 150.0–400.0)
RBC: 4.39 Mil/uL (ref 3.87–5.11)
RDW: 13.6 % (ref 11.5–15.5)
WBC: 3.8 10*3/uL — ABNORMAL LOW (ref 4.0–10.5)

## 2017-03-15 LAB — LIPID PANEL
CHOLESTEROL: 343 mg/dL — AB (ref 0–200)
HDL: 49.6 mg/dL (ref >39.00)
Total CHOL/HDL Ratio: 7
Triglycerides: 971 mg/dL — ABNORMAL HIGH (ref 0.0–149.0)

## 2017-03-15 LAB — HEPATIC FUNCTION PANEL
ALT: 38 U/L — ABNORMAL HIGH (ref 0–35)
AST: 24 U/L (ref 0–37)
Albumin: 5 g/dL (ref 3.5–5.2)
Alkaline Phosphatase: 32 U/L — ABNORMAL LOW (ref 39–117)
Bilirubin, Direct: 0.1 mg/dL (ref 0.0–0.3)
TOTAL PROTEIN: 7.4 g/dL (ref 6.0–8.3)
Total Bilirubin: 0.9 mg/dL (ref 0.2–1.2)

## 2017-03-15 LAB — LDL CHOLESTEROL, DIRECT: Direct LDL: 130 mg/dL

## 2017-03-15 NOTE — Progress Notes (Signed)
   Subjective:    Patient ID: [name], female    DOB: Oct 24, 1956, 61 y.o.   MRN: [id number]  HPI HTN- chronic problem, on HCTZ and Metoprolol.  BP is well controlled today.  PT saw cardiology last week and BP was well controlled there as well.  Pt reports she is no longer binge eating, she's exercising regularly.  No CP, SOB, HAs, visual changes, edema.  Hyperlipidemia- 'the statins killed me' but 'i'm really concerned'.  Is not currently on any lipid medication.     Review of Systems For ROS see HPI     Objective:   Physical Exam  Constitutional: She is oriented to person, place, and time. She appears well-developed and well-nourished. No distress.  HENT:  Head: Normocephalic and atraumatic.  Eyes: Conjunctivae and EOM are normal. Pupils are equal, round, and reactive to light.  Neck: Normal range of motion. Neck supple. No thyromegaly present.  Cardiovascular: Normal rate, regular rhythm, normal heart sounds and intact distal pulses.   No murmur heard. Pulmonary/Chest: Effort normal and breath sounds normal. No respiratory distress.  Abdominal: Soft. She exhibits no distension. There is no tenderness.  Musculoskeletal: She exhibits no edema.  Lymphadenopathy:    She has no cervical adenopathy.  Neurological: She is alert and oriented to person, place, and time.  Skin: Skin is warm and dry.  Psychiatric: She has a normal mood and affect. Her behavior is normal.  Vitals reviewed.         Assessment & Plan:

## 2017-03-15 NOTE — Assessment & Plan Note (Signed)
Chronic problem.  Well controlled today.  Asymptomatic.  Check labs.  No anticipated med changes.  Will follow. 

## 2017-03-15 NOTE — Patient Instructions (Signed)
Follow up as scheduled We'll notify you of your lab results and make any changes if needed Continue to work on healthy diet and regular exercise- you're doing great! Call with any questions or concerns Happy Spring!!!

## 2017-03-15 NOTE — Assessment & Plan Note (Signed)
Chronic problem.  Not able to tolerate cholesterol medication.  She has re-committed to healthy diet and regular exercise.  Check labs.  Re-visit meds prn.

## 2017-03-15 NOTE — Progress Notes (Signed)
Pre visit review using our clinic review tool, if applicable. No additional management support is needed unless otherwise documented below in the visit note. 

## 2017-03-17 ENCOUNTER — Telehealth: Payer: Self-pay | Admitting: Family Medicine

## 2017-03-17 DIAGNOSIS — E785 Hyperlipidemia, unspecified: Secondary | ICD-10-CM

## 2017-03-17 NOTE — Telephone Encounter (Signed)
Pt states that insurance suggested she try gemfibrozil 600mg  that this would be covered for her cholesterol[facility] [address]. Pt states that she is unable to go to the lipid clinic due to cost and would like to try a med in the simvastatin family. Pt states that she will try to go to the lipid clinic next month.

## 2017-03-19 MED ORDER — GEMFIBROZIL 600 MG PO TABS: 600.0000 mg | ORAL_TABLET | Freq: Two times a day (BID) | ORAL | 3 refills | Status: DC

## 2017-03-19 NOTE — Telephone Encounter (Signed)
Pt informed, med filled to local pharmacy and labs ordered.

## 2017-03-19 NOTE — Telephone Encounter (Signed)
Pt LMOVM wanting a status update and a call back from KT.

## 2017-03-19 NOTE — Telephone Encounter (Signed)
Pt calling checking status on this. Please advise.

## 2017-03-19 NOTE — Telephone Encounter (Signed)
Ok to start Gemfibrozil 600mg  BID 30 minutes prior to eating.  Will need repeat LFTs in 6-8 weeks at lab only visit.

## 2017-03-22 DIAGNOSIS — R69 Illness, unspecified: Secondary | ICD-10-CM | POA: Diagnosis not present

## 2017-04-01 ENCOUNTER — Other Ambulatory Visit: Payer: Self-pay | Admitting: Physician Assistant

## 2017-04-01 DIAGNOSIS — I10 Essential (primary) hypertension: Secondary | ICD-10-CM

## 2017-04-06 ENCOUNTER — Other Ambulatory Visit: Payer: Self-pay | Admitting: Emergency Medicine

## 2017-04-06 DIAGNOSIS — I10 Essential (primary) hypertension: Secondary | ICD-10-CM

## 2017-04-06 MED ORDER — HYDROCHLOROTHIAZIDE 12.5 MG PO TABS: 12.5000 mg | ORAL_TABLET | Freq: Every day | ORAL | 5 refills | Status: DC

## 2017-04-07 ENCOUNTER — Encounter: Payer: Medicare HMO | Admitting: Family Medicine

## 2017-04-13 DIAGNOSIS — R69 Illness, unspecified: Secondary | ICD-10-CM | POA: Diagnosis not present

## 2017-04-20 ENCOUNTER — Ambulatory Visit (INDEPENDENT_AMBULATORY_CARE_PROVIDER_SITE_OTHER): Payer: Medicare HMO | Admitting: Family Medicine

## 2017-04-20 ENCOUNTER — Encounter: Payer: Self-pay | Admitting: Family Medicine

## 2017-04-20 VITALS — BP 130/90 | HR 79 | Temp 98.5°F | Resp 16 | Ht 69.0 in | Wt 233.4 lb

## 2017-04-20 DIAGNOSIS — I10 Essential (primary) hypertension: Secondary | ICD-10-CM

## 2017-04-20 DIAGNOSIS — F310 Bipolar disorder, current episode hypomanic: Secondary | ICD-10-CM | POA: Diagnosis not present

## 2017-04-20 DIAGNOSIS — R69 Illness, unspecified: Secondary | ICD-10-CM | POA: Diagnosis not present

## 2017-04-20 NOTE — Assessment & Plan Note (Signed)
Chronic problem.  Adequate control today.  Asymptomatic w/ exception of anxiety.  No med changes at this time.  Will follow closely.

## 2017-04-20 NOTE — Progress Notes (Signed)
Pre visit review using our clinic review tool, if applicable. No additional management support is needed unless otherwise documented below in the visit note. 

## 2017-04-20 NOTE — Patient Instructions (Signed)
Follow up in 3-4 weeks to recheck mood and BP No med changes at this time Please call your psychiatrist or PA to discuss your worsening anxiety as this is likely the cause of your symptoms and not the Metoprolol Call with any questions or concerns Hang in there!  You can do this!

## 2017-04-20 NOTE — Assessment & Plan Note (Signed)
Pt admits to being very anxious and not able to sleep.  She is almost hypomanic at this time.  I spent quite a bit of time convincing her that this is not due to her metoprolol as she was doing very well in May when I saw her on the same medication regimen.  She did finally agree that she would call her psychiatrist and review her current sxs.

## 2017-04-20 NOTE — Progress Notes (Signed)
   Subjective:    Patient ID: [name], female    DOB: [birth year], 61 y.o.   MRN: [id number]  HPI 'i'm having some problems with the medication for my blood pressure (metoprolol)'- pt reports she's not able to sleep and when she wakes, 'i'm a nervous wreck'.  Some nausea and fatigue.  Was previously on Atenolol w/o difficulty.  'it makes me feel not so good'.  Pt reports increased anxiety at baseline.   Review of Systems For ROS see HPI     Objective:   Physical Exam  Constitutional: She is oriented to person, place, and time. She appears well-developed and well-nourished. No distress.  HENT:  Head: Normocephalic and atraumatic.  Eyes: Conjunctivae and EOM are normal. Pupils are equal, round, and reactive to light.  Neck: Normal range of motion. Neck supple. No thyromegaly present.  Cardiovascular: Normal rate, regular rhythm, normal heart sounds and intact distal pulses.   No murmur heard. Pulmonary/Chest: Effort normal and breath sounds normal. No respiratory distress.  Abdominal: Soft. She exhibits no distension. There is no tenderness.  Musculoskeletal: She exhibits no edema.  Lymphadenopathy:    She has no cervical adenopathy.  Neurological: She is alert and oriented to person, place, and time.  Skin: Skin is warm and dry.  Psychiatric:  Very anxious  Vitals reviewed.         Assessment & Plan:

## 2017-04-21 ENCOUNTER — Telehealth: Payer: Self-pay | Admitting: Interventional Cardiology

## 2017-04-21 ENCOUNTER — Ambulatory Visit: Payer: Medicare HMO | Admitting: Family Medicine

## 2017-04-21 DIAGNOSIS — R002 Palpitations: Secondary | ICD-10-CM

## 2017-04-21 NOTE — Telephone Encounter (Signed)
OK to schedule 30 day event monitor for palpitations

## 2017-04-21 NOTE — Telephone Encounter (Signed)
Patient is calling and states that she has been waking up between 3 and 5 AM with her heart racing that she feels like has gotten worse in the last 2 weeks. She states that she is very anxious and that she has been having trouble sleeping. She states that she does get SOB when it happens, but her palpitations get better with resting and deep breathing. She denies any other symptoms at this time. She states that she is a "hot mess" and does not know what to do. She states that her BP has been high and that she used to take atenolol that seemed to work better for her. Patient reports BP at 135/90. Patient currently takes Toprol XL 50 mg QD and HCTZ 12.5 mg QD. Patient also takes medicine for anxiety. Patient was seen by her PCP yesterday who felt that her symptoms were related to her anxiety. She was advised to follow up with her psychiatrist to address her increasing anxiety. Patient wanted to make MD aware that she was still having palpitations. Patient advised to follow up with her psychiatrist for her worsening anxiety and that the information would be forwarded to Dr. Irish Lack for review. Patient verbalized understanding thanked me for the call.

## 2017-04-21 NOTE — Telephone Encounter (Signed)
New message    Pt is calling stating between 3-5 am she wakes up feeling like her heart is going to beat out of her chest.   Patient c/o Palpitations:  High priority if patient c/o lightheadedness and shortness of breath.  1. How long have you been having palpitations? Since she saw Dr. Irish Lack last. Gotten worse the last 2 weeks.  2. Are you currently experiencing lightheadedness and shortness of breath? She said yes when it happens.   3. Have you checked your BP and heart rate? (document readings) 135/90  4. Are you experiencing any other symptoms? No

## 2017-04-21 NOTE — Telephone Encounter (Signed)
Patient made aware that Dr. Irish Lack is okay with scheduling a 30 day event monitor. Order placed. Patient made aware that someone would be contacting her soon to schedule the monitor. Patient verbalized understanding and appreciated the call. Message sent to [facility].

## 2017-04-22 NOTE — Telephone Encounter (Signed)
Patient is scheduled 6/22 @ 11:00 AM for 30-day event monitor.

## 2017-04-28 ENCOUNTER — Encounter: Payer: Self-pay | Admitting: Obstetrics & Gynecology

## 2017-04-30 ENCOUNTER — Ambulatory Visit (INDEPENDENT_AMBULATORY_CARE_PROVIDER_SITE_OTHER): Payer: Medicare HMO

## 2017-04-30 DIAGNOSIS — R002 Palpitations: Secondary | ICD-10-CM | POA: Diagnosis not present

## 2017-05-03 ENCOUNTER — Other Ambulatory Visit (INDEPENDENT_AMBULATORY_CARE_PROVIDER_SITE_OTHER): Payer: Medicare HMO

## 2017-05-03 DIAGNOSIS — E785 Hyperlipidemia, unspecified: Secondary | ICD-10-CM

## 2017-05-03 LAB — HEPATIC FUNCTION PANEL
ALBUMIN: 4.8 g/dL (ref 3.5–5.2)
ALT: 22 U/L (ref 0–35)
AST: 17 U/L (ref 0–37)
Alkaline Phosphatase: 31 U/L — ABNORMAL LOW (ref 39–117)
BILIRUBIN TOTAL: 0.6 mg/dL (ref 0.2–1.2)
Bilirubin, Direct: 0.1 mg/dL (ref 0.0–0.3)
TOTAL PROTEIN: 7.1 g/dL (ref 6.0–8.3)

## 2017-05-10 DIAGNOSIS — R69 Illness, unspecified: Secondary | ICD-10-CM | POA: Diagnosis not present

## 2017-05-14 ENCOUNTER — Telehealth: Payer: Self-pay | Admitting: Interventional Cardiology

## 2017-05-14 NOTE — Telephone Encounter (Signed)
Called, spoke with pt. Pt informed she woke up around 4 or 5 am this morning with her heart pounding and rapid heart rate. Pt stated she is not sure if it is anxiety related, or perhaps she was having a bad dream. Inquired if pt has had a chance to schedule an appt with her psychiatrist. Pt stated she has not. Pt stated she is on a waiting list.  Reviewed [facility] telemetry report from 05/14/17 at 5:13 AM (CT). It was auto-triggered. Symptoms: palpitations/rapid heart rate; Activity: laying down; Preliminary Findings: Sinus Tachycardia. Pt stated she feel okay today. Inquired about BP and HR. Pt stated she does not obtain on a regular basis. Pt goes to her pharmacy to obtain reading as needed. Pt stated she thinks she was better when she was taking Atenolol vs. taking Metoprolol Succinate. Informed Dr. Irish Lack will be in the office on Monday, 7/9.  Informed to call our office if rapid HR sustains, or if symptoms get worse.  I will forward this to Dr. Irish Lack to advise.

## 2017-05-14 NOTE — Telephone Encounter (Signed)
New message      Talk to a nurse.  Pt is wearing an event monitor and want to tell her about irr heart rate during the night.

## 2017-05-17 ENCOUNTER — Encounter: Payer: Self-pay | Admitting: Family Medicine

## 2017-05-17 ENCOUNTER — Ambulatory Visit (INDEPENDENT_AMBULATORY_CARE_PROVIDER_SITE_OTHER): Payer: Medicare HMO | Admitting: Family Medicine

## 2017-05-17 VITALS — BP 121/88 | HR 85 | Resp 16 | Ht 69.0 in | Wt 230.5 lb

## 2017-05-17 DIAGNOSIS — I10 Essential (primary) hypertension: Secondary | ICD-10-CM

## 2017-05-17 DIAGNOSIS — R69 Illness, unspecified: Secondary | ICD-10-CM | POA: Diagnosis not present

## 2017-05-17 DIAGNOSIS — F310 Bipolar disorder, current episode hypomanic: Secondary | ICD-10-CM

## 2017-05-17 LAB — BASIC METABOLIC PANEL
BUN: 15 mg/dL (ref 6–23)
CALCIUM: 9.9 mg/dL (ref 8.4–10.5)
CO2: 21 meq/L (ref 19–32)
Chloride: 103 mEq/L (ref 96–112)
Creatinine, Ser: 0.6 mg/dL (ref 0.40–1.20)
GFR: 108.47 mL/min (ref >60.00)
Glucose, Bld: 122 mg/dL — ABNORMAL HIGH (ref 70–99)
Potassium: 4.1 mEq/L (ref 3.5–5.1)
Sodium: 139 mEq/L (ref 135–145)

## 2017-05-17 NOTE — Progress Notes (Signed)
Pre visit review using our clinic review tool, if applicable. No additional management support is needed unless otherwise documented below in the visit note. 

## 2017-05-17 NOTE — Telephone Encounter (Signed)
Did we switch her to Metoprolol from atenolol?  If she prefers, we could go back to atenolol.

## 2017-05-17 NOTE — Patient Instructions (Signed)
Follow up as scheduled We'll notify you of your lab results and make any changes if needed We will reach out to Dr Ebony Hail and see if we can get you a sooner appt Continue to work on stress management- you can do it! Call with any questions or concerns Hang in there!

## 2017-05-17 NOTE — Telephone Encounter (Signed)
Patient made aware that Dr. Irish Lack is okay with her switching back to atenolol if she felt like her palpitations are better controlled with it. Patient states that she is going to discuss with [name] since she is managing her medicines.

## 2017-05-17 NOTE — Telephone Encounter (Signed)
Follow up ° ° °Pt calling back °

## 2017-05-17 NOTE — Telephone Encounter (Signed)
Left message for patient to call back  

## 2017-05-17 NOTE — Progress Notes (Signed)
   Subjective:    Patient ID: [name], female    DOB: Aug 01, 1956, 61 y.o.   MRN: [id number]  HPI HTN- started on HCTZ last time for elevated BP.  BP is better today.  Denies CP.  + palpitations- 'but I know it's coming from my nervousness'.  Pt is wearing an event monitor currently for Cards.  No SOB.    Anxiety- pt is waiting for appt w/ Dr Ebony Hail, 'my nerves just haven't been good'.  Pt is on a cancellation list but her appt isn't until September.  Pt is now taking Zyprexa at night due to her increased anxiety.   Review of Systems For ROS see HPI     Objective:   Physical Exam  Constitutional: She is oriented to person, place, and time. She appears well-developed and well-nourished. No distress.  HENT:  Head: Normocephalic and atraumatic.  Eyes: Conjunctivae and EOM are normal. Pupils are equal, round, and reactive to light.  Neck: Normal range of motion. Neck supple. No thyromegaly present.  Cardiovascular: Normal rate, regular rhythm, normal heart sounds and intact distal pulses.   No murmur heard. Pulmonary/Chest: Effort normal and breath sounds normal. No respiratory distress.  Abdominal: Soft. She exhibits no distension. There is no tenderness.  Musculoskeletal: She exhibits no edema.  Lymphadenopathy:    She has no cervical adenopathy.  Neurological: She is alert and oriented to person, place, and time.  Skin: Skin is warm and dry.  Psychiatric: She has a normal mood and affect. Her behavior is normal.  Vitals reviewed.         Assessment & Plan:

## 2017-05-18 ENCOUNTER — Encounter: Payer: Self-pay | Admitting: General Practice

## 2017-05-18 DIAGNOSIS — R69 Illness, unspecified: Secondary | ICD-10-CM | POA: Diagnosis not present

## 2017-05-20 NOTE — Assessment & Plan Note (Signed)
Chronic problem.  Well controlled today on HCTZ (added at last visit) and Metoprolol.  Asymptomatic w/ exception of palpitations when nervous.  Check BMP due to addition of HCTZ.  No anticipated med changes.  Will follow.

## 2017-05-20 NOTE — Assessment & Plan Note (Signed)
Chronic problem.  Anxiety is not well controlled but pt is having a hard time getting in to see her psychiatrist.  Will place referral and see if we can help facilitate this.  Pt expressed understanding and is in agreement w/ plan.

## 2017-05-21 ENCOUNTER — Telehealth: Payer: Self-pay | Admitting: Interventional Cardiology

## 2017-05-21 NOTE — Telephone Encounter (Signed)
New message    Pt is calling with some questions about her heart monitor. Please call.

## 2017-05-21 NOTE — Telephone Encounter (Signed)
Pt calling because the past couple of nights she has woke around 4 AM or so feeling like she is having a panic attack.  She is on several medications to treat anxiety.  She would like [place] to call her with the results of the monitor she is wearing once they are available.  Advised pt she will be called with the results and also made her aware the monitor company is very good about contacting the office/MD on call if any abnormal rhythms are noted while the patient is wearing the monitor.  Suggested she call MD that is treating her anxiety if episodes of panic continue.  She states understanding and thanked me for my time.

## 2017-05-24 DIAGNOSIS — G4733 Obstructive sleep apnea (adult) (pediatric): Secondary | ICD-10-CM | POA: Diagnosis not present

## 2017-06-04 ENCOUNTER — Telehealth: Payer: Self-pay

## 2017-06-04 ENCOUNTER — Other Ambulatory Visit: Payer: Self-pay | Admitting: Family Medicine

## 2017-06-04 NOTE — Telephone Encounter (Signed)
Patient is on the list for [employer] 2018 and may be a good candidate for an AWV. Please let me know if/when appt is scheduled.   

## 2017-06-09 ENCOUNTER — Other Ambulatory Visit: Payer: Self-pay | Admitting: Family Medicine

## 2017-06-10 DIAGNOSIS — R69 Illness, unspecified: Secondary | ICD-10-CM | POA: Diagnosis not present

## 2017-06-11 ENCOUNTER — Telehealth: Payer: Self-pay | Admitting: Interventional Cardiology

## 2017-06-11 NOTE — Telephone Encounter (Signed)
New message    Pt is calling to get results of her heart monitor. Please call.

## 2017-06-11 NOTE — Telephone Encounter (Signed)
Patient returned call to get results.  When I called her back got voice mail.  I left detailed message (DPR) of results and recommendations and instructed patient to please call back if she would like to discuss further or if she has questions.

## 2017-06-16 DIAGNOSIS — R69 Illness, unspecified: Secondary | ICD-10-CM | POA: Diagnosis not present

## 2017-06-29 DIAGNOSIS — R69 Illness, unspecified: Secondary | ICD-10-CM | POA: Diagnosis not present

## 2017-06-29 NOTE — Telephone Encounter (Signed)
Notes recorded by Rodman Key, RN on 06/11/2017 at 11:10 AM EDT Patient returned call to get results. When I called her back got voice mail. I left detailed message (DPR) of results and recommendations and instructed patient to please call back if she would like to discuss further or if she has questions.

## 2017-07-06 ENCOUNTER — Telehealth: Payer: Self-pay

## 2017-07-06 NOTE — Telephone Encounter (Signed)
LM requesting call back regarding AWV. Requesting pt to have AWV with [facility] on 07/15/17 @ 0900 (instead of 11am), prior to appt with PCP.

## 2017-07-07 DIAGNOSIS — R69 Illness, unspecified: Secondary | ICD-10-CM | POA: Diagnosis not present

## 2017-07-15 ENCOUNTER — Encounter: Payer: Medicare HMO | Admitting: Family Medicine

## 2017-07-20 DIAGNOSIS — R69 Illness, unspecified: Secondary | ICD-10-CM | POA: Diagnosis not present

## 2017-07-22 DIAGNOSIS — R69 Illness, unspecified: Secondary | ICD-10-CM | POA: Diagnosis not present

## 2017-07-27 ENCOUNTER — Other Ambulatory Visit: Payer: Self-pay | Admitting: *Deleted

## 2017-07-27 DIAGNOSIS — R69 Illness, unspecified: Secondary | ICD-10-CM | POA: Diagnosis not present

## 2017-07-27 NOTE — Telephone Encounter (Signed)
Medication refill request: Premarin 0.625 tab  Last AEX:  10/22/16 SM Next AEX: none Last MMG (if hormonal medication request): 11/06/16 BIRADS2:Benign  Refill authorized: 10/19/16 #[id number]  Please advise.

## 2017-07-28 ENCOUNTER — Ambulatory Visit (INDEPENDENT_AMBULATORY_CARE_PROVIDER_SITE_OTHER): Payer: Medicare HMO

## 2017-07-28 VITALS — BP 116/70 | HR 87 | Ht 69.0 in | Wt 229.4 lb

## 2017-07-28 DIAGNOSIS — Z0000 Encounter for general adult medical examination without abnormal findings: Secondary | ICD-10-CM | POA: Diagnosis not present

## 2017-07-28 DIAGNOSIS — Z23 Encounter for immunization: Secondary | ICD-10-CM

## 2017-07-28 NOTE — Progress Notes (Addendum)
Subjective:   [name] is a 61 y.o. female who presents for Medicare Annual (Subsequent) preventive examination.  Review of Systems:  No ROS.  Medicare Wellness Visit. Additional risk factors are reflected in the social history.  Cardiac Risk Factors include: dyslipidemia;obesity (BMI >30kg/m2);sedentary lifestyle;family history of premature cardiovascular disease   Sleep patterns: Sleeps 9 hours, uses CPAP.  Home Safety/Smoke Alarms: Feels safe in home. Smoke alarms in place.  Living environment; residence and Firearm Safety: Lives alone in 2 story home.  Seat Belt Safety/Bike Helmet: Wears seat belt.   Female:   Pap-07/11/2015       Mammo-11/06/2016, benign. [facility]       Dexa-07/11/2015, normal.    CCS-Colonoscopy 12/10/2006, polyps. Pt will call to schedule, number provided.      Objective:     Vitals: BP 116/70 (BP Location: Left Arm, Patient Position: Sitting, Cuff Size: Large)   Pulse 87   Ht 5\' 9"  (1.753 m)   Wt 229 lb 6.4 oz (104.1 kg)   LMP 11/09/2000   SpO2 96%   BMI 33.88 kg/m   Body mass index is 33.88 kg/m.   Tobacco History  Smoking Status  . Never Smoker  Smokeless Tobacco  . Never Used     Counseling given: Not Answered   Past Medical History:  Diagnosis Date  . Allergic rhinitis   . Anxiety   . Arthritis    R knee, Left Knee  . Asthma   . Bipolar disorder ([place])   . Cataract   . Complication of anesthesia    had bad anxiety after anesthesia  . Constipation   . Constipation, chronic   . Depression   . Dysrhythmia    Palpations occ, takes Tenormin  . GERD (gastroesophageal reflux disease)   . Heart murmur   . Hyperlipidemia   . MVP (mitral valve prolapse)   . Urinary tract infection    frequent, see Urololgist 12/16/2010   Past Surgical History:  Procedure Laterality Date  . ABDOMINAL HYSTERECTOMY  2002   no oophorectomy  . BACK SURGERY  2001   L5 - S1  . NASAL SINUS SURGERY  01/2010   x2  . TONSILLECTOMY    . TOTAL KNEE  ARTHROPLASTY  12/23/2011   Procedure: TOTAL KNEE ARTHROPLASTY;  Surgeon: Ninetta Lights, MD;  Location: [facility];  Service: Orthopedics;  Laterality: Right;  DR MURPHY WANTS 120 MINUTES FOR SURGERY  . TOTAL KNEE ARTHROPLASTY  08/31/2012   left knee  . TOTAL KNEE ARTHROPLASTY  08/31/2012   Procedure: TOTAL KNEE ARTHROPLASTY;  Surgeon: Ninetta Lights, MD;  Location: [facility];  Service: Orthopedics;  Laterality: Left;   Family History  Problem Relation Age of Onset  . Coronary artery disease Father   . Heart attack Father 68  . Heart disease Father   . Lung disease Mother        Mycobacterium avium intracellulare  . Hypertension Brother   . Hyperlipidemia Brother   . Anesthesia problems Neg Hx    History  Sexual Activity  . Sexual activity: No    Outpatient Encounter Prescriptions as of 07/28/2017  Medication Sig  . clonazePAM (KLONOPIN) 2 MG tablet 1 mg 4 (four) times daily as needed.   . gabapentin (NEURONTIN) 600 MG tablet Take 600 mg by mouth 4 (four) times daily.   Marland Kitchen gemfibrozil (LOPID) 600 MG tablet TAKE 1 TABLET (600 MG TOTAL) BY MOUTH 2 (TWO) TIMES DAILY BEFORE A MEAL.  . hydrochlorothiazide (HYDRODIURIL)  12.5 MG tablet Take 1 tablet (12.5 mg total) by mouth daily.  Marland Kitchen lamoTRIgine (LAMICTAL) 200 MG tablet Take 200 mg by mouth 2 (two) times daily.   . metoprolol succinate (TOPROL-XL) 50 MG 24 hr tablet TAKE 1 TABLET BY MOUTH EVERY DAY *WITH OR IMMEDIATELY FOLLOWING A MEAL*  . OLANZapine (ZYPREXA) 10 MG tablet Take 10 mg by mouth daily as needed.  . Omega-3 Fatty Acids (FISH OIL) 1000 MG CAPS Take 1 capsule by mouth daily.  . QUEtiapine (SEROQUEL XR) 400 MG 24 hr tablet TK 1 T PO QPM WF  . QUEtiapine (SEROQUEL) 50 MG tablet TK 1 T PO TID  . QVAR 80 MCG/ACT inhaler INHALE 2 PUFFS INTO THE LUNGS 2 (TWO) TIMES DAILY.  Marland Kitchen venlafaxine (EFFEXOR-XR) 75 MG 24 hr capsule Take 225 mg by mouth daily.    No facility-administered encounter medications on file as of 07/28/2017.     Activities of  Daily Living In your present state of health, do you have any difficulty performing the following activities: 07/28/2017 08/17/2016  Hearing? N N  Vision? N N  Difficulty concentrating or making decisions? N N  Walking or climbing stairs? N N  Dressing or bathing? N N  Doing errands, shopping? N N  Preparing Food and eating ? N -  Using the Toilet? N -  In the past six months, have you accidently leaked urine? N -  Do you have problems with loss of bowel control? N -  Managing your Medications? N -  Managing your Finances? N -  Housekeeping or managing your Housekeeping? N -  Some recent data might be hidden    Patient Care Team: Midge Minium, MD as PCP - General Midge Minium, MD Gatha Mayer, MD as Consulting Physician (Gastroenterology) Arvella Nigh, MD as Consulting Physician (Obstetrics and Gynecology) Megan Salon, MD as Consulting Physician (Gynecology) Jettie Booze, MD as Consulting Physician (Cardiology) Leta Baptist, MD as Consulting Physician (Otolaryngology) Brayton Caves, MD as Referring Physician (Psychiatry) Janeth Rase, NP as Nurse Practitioner (Adult Health Nurse Practitioner) Darlyne Russian, LCSW as Social Worker (Licensed Clinical Social Worker)    Assessment:    Physical assessment deferred to PCP.  Exercise Activities and Dietary recommendations Current Exercise Habits: Home exercise routine, Type of exercise: walking, Time (Minutes): 15, Frequency (Times/Week): 1, Weekly Exercise (Minutes/Week): 15, Exercise limited by: None identified   Diet (meal preparation, eat out, water intake, caffeinated beverages, dairy products, fruits and vegetables): Drinks water.   Breakfast: Skips; eggs/turkey bacon Lunch: salad Dinner: cereal   Discussed DASH diet and increasing activity.   Goals      Patient Stated   . Increase physical activity and eat healthy. (pt-stated)      Other   . LDL CALC < 130          LDL-P number goal <  1300 nmol/L      Fall Risk Fall Risk  07/28/2017 08/17/2016 08/13/2015  Falls in the past year? Yes No No  Number falls in past yr: 1 - -  Injury with Fall? No - -  Follow up Falls prevention discussed - -   Depression Screen PHQ 2/9 Scores 07/28/2017 03/15/2017 08/17/2016 08/13/2015  PHQ - 2 Score 2 0 1 0  PHQ- 9 Score 7 0 - -     Cognitive Function MMSE - Mini Mental State Exam 08/13/2015  Orientation to time 5  Orientation to Place 5  Registration 3  Attention/ Calculation 5  Recall 1  Language- name 2 objects 2  Language- repeat 1  Language- follow 3 step command 3  Language- read & follow direction 1  Write a sentence 1  Copy design 1  Total score 28   Ad8 score reviewed for issues:  Issues making decisions: no  Less interest in hobbies / activities: no  Repeats questions, stories (family complaining): no  Trouble using ordinary gadgets (microwave, computer, phone): no  Forgets the month or year: no  Mismanaging finances: no  Remembering appts: no  Daily problems with thinking and/or memory: no Ad8 score is=0        Immunization History  Administered Date(s) Administered  . Influenza Split 09/02/2012  . Influenza,inj,Quad PF,6+ Mos 10/09/2013, 08/13/2015, 08/17/2016  . Pneumococcal Polysaccharide-23 09/02/2012  . Tdap 07/15/2012   Screening Tests Health Maintenance  Topic Date Due  . INFLUENZA VACCINE  06/09/2017  . COLONOSCOPY  08/09/2017 (Originally 12/10/2016)  . HIV Screening  08/09/2017 (Originally 08/13/1971)  . PAP SMEAR  07/10/2018  . MAMMOGRAM  11/06/2018  . TETANUS/TDAP  07/15/2022  . Hepatitis C Screening  Completed   Influenza vaccine administered today.      Plan:    Schedule colonoscopy, [phone].   Bring a copy of your living will and/or healthcare power of attorney to your next office visit.  Start doing brain stimulating activities (puzzles, reading, adult coloring books, staying active) to keep memory sharp.    I have  personally reviewed and noted the following in the patient's chart:   . Medical and social history . Use of alcohol, tobacco or illicit drugs  . Current medications and supplements . Functional ability and status . Nutritional status . Physical activity . Advanced directives . List of other physicians . Hospitalizations, surgeries, and ER visits in previous 12 months . Vitals . Screenings to include cognitive, depression, and falls . Referrals and appointments  In addition, I have reviewed and discussed with patient certain preventive protocols, quality metrics, and best practice recommendations. A written personalized care plan for preventive services as well as general preventive health recommendations were provided to patient.     Gerilyn Nestle, RN  07/28/2017  PCP Notes: -PHQ9=7 -F/U with PCP (CPE) on 08/04/17  Reviewed documentation and agree w/ above.  Pt sees psychiatry and they are adjusting meds.  Will ask about this at her CPE.  Annye Asa, MD

## 2017-07-28 NOTE — Patient Instructions (Addendum)
Schedule colonoscopy, [phone].   Bring a copy of your living will and/or healthcare power of attorney to your next office visit.  Start doing brain stimulating activities (puzzles, reading, adult coloring books, staying active) to keep memory sharp.    DASH Eating Plan DASH stands for "Dietary Approaches to Stop Hypertension." The DASH eating plan is a healthy eating plan that has been shown to reduce high blood pressure (hypertension). It may also reduce your risk for type 2 diabetes, heart disease, and stroke. The DASH eating plan may also help with weight loss. What are tips for following this plan? General guidelines  Avoid eating more than 2,300 mg (milligrams) of salt (sodium) a day. If you have hypertension, you may need to reduce your sodium intake to 1,500 mg a day.  Limit alcohol intake to no more than 1 drink a day for nonpregnant women and 2 drinks a day for men. One drink equals 12 oz of beer, 5 oz of wine, or 1 oz of hard liquor.  Work with your health care provider to maintain a healthy body weight or to lose weight. Ask what an ideal weight is for you.  Get at least 30 minutes of exercise that causes your heart to beat faster (aerobic exercise) most days of the week. Activities may include walking, swimming, or biking.  Work with your health care provider or diet and nutrition specialist (dietitian) to adjust your eating plan to your individual calorie needs. Reading food labels  Check food labels for the amount of sodium per serving. Choose foods with less than 5 percent of the Daily Value of sodium. Generally, foods with less than 300 mg of sodium per serving fit into this eating plan.  To find whole grains, look for the word "whole" as the first word in the ingredient list. Shopping  Buy products labeled as "low-sodium" or "no salt added."  Buy fresh foods. Avoid canned foods and premade or frozen meals. Cooking  Avoid adding salt when cooking. Use salt-free  seasonings or herbs instead of table salt or sea salt. Check with your health care provider or pharmacist before using salt substitutes.  Do not fry foods. Cook foods using healthy methods such as baking, boiling, grilling, and broiling instead.  Cook with heart-healthy oils, such as olive, canola, soybean, or sunflower oil. Meal planning   Eat a balanced diet that includes: ? 5 or more servings of fruits and vegetables each day. At each meal, try to fill half of your plate with fruits and vegetables. ? Up to 6-8 servings of whole grains each day. ? Less than 6 oz of lean meat, poultry, or fish each day. A 3-oz serving of meat is about the same size as a deck of cards. One egg equals 1 oz. ? 2 servings of low-fat dairy each day. ? A serving of nuts, seeds, or beans 5 times each week. ? Heart-healthy fats. Healthy fats called Omega-3 fatty acids are found in foods such as flaxseeds and coldwater fish, like sardines, salmon, and mackerel.  Limit how much you eat of the following: ? Canned or prepackaged foods. ? Food that is high in trans fat, such as fried foods. ? Food that is high in saturated fat, such as fatty meat. ? Sweets, desserts, sugary drinks, and other foods with added sugar. ? Full-fat dairy products.  Do not salt foods before eating.  Try to eat at least 2 vegetarian meals each week.  Eat more home-cooked food and less restaurant, buffet,  and fast food.  When eating at a restaurant, ask that your food be prepared with less salt or no salt, if possible. What foods are recommended? The items listed may not be a complete list. Talk with your dietitian about what dietary choices are best for you. Grains Whole-grain or whole-wheat bread. Whole-grain or whole-wheat pasta. Brown rice. [place]. Bulgur. Whole-grain and low-sodium cereals. Pita bread. Low-fat, low-sodium crackers. Whole-wheat flour tortillas. Vegetables Fresh or frozen vegetables (raw, steamed, roasted,  or grilled). Low-sodium or reduced-sodium tomato and vegetable juice. Low-sodium or reduced-sodium tomato sauce and tomato paste. Low-sodium or reduced-sodium canned vegetables. Fruits All fresh, dried, or frozen fruit. Canned fruit in natural juice (without added sugar). Meat and other protein foods Skinless chicken or [place]. Ground chicken or [place]. Pork with fat trimmed off. Fish and seafood. Egg whites. Dried beans, peas, or lentils. Unsalted nuts, nut butters, and seeds. Unsalted canned beans. Lean cuts of beef with fat trimmed off. Low-sodium, lean deli meat. Dairy Low-fat (1%) or fat-free (skim) milk. Fat-free, low-fat, or reduced-fat cheeses. Nonfat, low-sodium ricotta or cottage cheese. Low-fat or nonfat yogurt. Low-fat, low-sodium cheese. Fats and oils Soft margarine without trans fats. Vegetable oil. Low-fat, reduced-fat, or light mayonnaise and salad dressings (reduced-sodium). Canola, safflower, olive, soybean, and sunflower oils. Avocado. Seasoning and other foods Herbs. Spices. Seasoning mixes without salt. Unsalted popcorn and pretzels. Fat-free sweets. What foods are not recommended? The items listed may not be a complete list. Talk with your dietitian about what dietary choices are best for you. Grains Baked goods made with fat, such as croissants, muffins, or some breads. Dry pasta or rice meal packs. Vegetables Creamed or fried vegetables. Vegetables in a cheese sauce. Regular canned vegetables (not low-sodium or reduced-sodium). Regular canned tomato sauce and paste (not low-sodium or reduced-sodium). Regular tomato and vegetable juice (not low-sodium or reduced-sodium). [name]. Olives. Fruits Canned fruit in a light or heavy syrup. Fried fruit. Fruit in cream or butter sauce. Meat and other protein foods Fatty cuts of meat. Ribs. Fried meat. Berniece Salines. Sausage. Bologna and other processed lunch meats. Salami. Fatback. Hotdogs. Bratwurst. Salted nuts and seeds. Canned beans  with added salt. Canned or smoked fish. Whole eggs or egg yolks. Chicken or [place] with skin. Dairy Whole or 2% milk, cream, and half-and-half. Whole or full-fat cream cheese. Whole-fat or sweetened yogurt. Full-fat cheese. Nondairy creamers. Whipped toppings. Processed cheese and cheese spreads. Fats and oils Butter. Stick margarine. Lard. Shortening. Ghee. Bacon fat. Tropical oils, such as coconut, palm kernel, or palm oil. Seasoning and other foods Salted popcorn and pretzels. Onion salt, garlic salt, seasoned salt, table salt, and sea salt. Worcestershire sauce. Tartar sauce. Barbecue sauce. Teriyaki sauce. Soy sauce, including reduced-sodium. Steak sauce. Canned and packaged gravies. Fish sauce. Oyster sauce. Cocktail sauce. Horseradish that you find on the shelf. Ketchup. Mustard. Meat flavorings and tenderizers. Bouillon cubes. Hot sauce and Tabasco sauce. Premade or packaged marinades. Premade or packaged taco seasonings. Relishes. Regular salad dressings. Where to find more information:  National Heart, Lung, and [place]: https://wilson-eaton.com/  American Heart Association: www.heart.org Summary  The DASH eating plan is a healthy eating plan that has been shown to reduce high blood pressure (hypertension). It may also reduce your risk for type 2 diabetes, heart disease, and stroke.  With the DASH eating plan, you should limit salt (sodium) intake to 2,300 mg a day. If you have hypertension, you may need to reduce your sodium intake to 1,500 mg a day.  When on  the DASH eating plan, aim to eat more fresh fruits and vegetables, whole grains, lean proteins, low-fat dairy, and heart-healthy fats.  Work with your health care provider or diet and nutrition specialist (dietitian) to adjust your eating plan to your individual calorie needs. This information is not intended to replace advice given to you by your health care provider. Make sure you discuss any questions you have with your health  care provider. Document Released: 10/15/2011 Document Revised: 10/19/2016 Document Reviewed: 10/19/2016 Elsevier Interactive Patient Education  2017 [place] Maintenance, Female Adopting a healthy lifestyle and getting preventive care can go a long way to promote health and wellness. Talk with your health care provider about what schedule of regular examinations is right for you. This is a good chance for you to check in with your provider about disease prevention and staying healthy. In between checkups, there are plenty of things you can do on your own. Experts have done a lot of research about which lifestyle changes and preventive measures are most likely to keep you healthy. Ask your health care provider for more information. Weight and diet Eat a healthy diet  Be sure to include plenty of vegetables, fruits, low-fat dairy products, and lean protein.  Do not eat a lot of foods high in solid fats, added sugars, or salt.  Get regular exercise. This is one of the most important things you can do for your health. ? Most adults should exercise for at least 150 minutes each week. The exercise should increase your heart rate and make you sweat (moderate-intensity exercise). ? Most adults should also do strengthening exercises at least twice a week. This is in addition to the moderate-intensity exercise.  Maintain a healthy weight  Body mass index (BMI) is a measurement that can be used to identify possible weight problems. It estimates body fat based on height and weight. Your health care provider can help determine your BMI and help you achieve or maintain a healthy weight.  For females 59 years of age and older: ? A BMI below 18.5 is considered underweight. ? A BMI of 18.5 to 24.9 is normal. ? A BMI of 25 to 29.9 is considered overweight. ? A BMI of 30 and above is considered obese.  Watch levels of cholesterol and blood lipids  You should start having your blood tested  for lipids and cholesterol at 60 years of age, then have this test every 5 years.  You may need to have your cholesterol levels checked more often if: ? Your lipid or cholesterol levels are high. ? You are older than 60 years of age. ? You are at high risk for heart disease.  Cancer screening Lung Cancer  Lung cancer screening is recommended for adults 30-67 years old who are at high risk for lung cancer because of a history of smoking.  A yearly low-dose CT scan of the lungs is recommended for people who: ? Currently smoke. ? Have quit within the past 15 years. ? Have at least a 30-pack-year history of smoking. A pack year is smoking an average of one pack of cigarettes a day for 1 year.  Yearly screening should continue until it has been 15 years since you quit.  Yearly screening should stop if you develop a health problem that would prevent you from having lung cancer treatment.  Breast Cancer  Practice breast self-awareness. This means understanding how your breasts normally appear and feel.  It also means doing regular breast self-exams.  Let your health care provider know about any changes, no matter how small.  If you are in your 20s or 30s, you should have a clinical breast exam (CBE) by a health care provider every 1-3 years as part of a regular health exam.  If you are 29 or older, have a CBE every year. Also consider having a breast X-ray (mammogram) every year.  If you have a family history of breast cancer, talk to your health care provider about genetic screening.  If you are at high risk for breast cancer, talk to your health care provider about having an MRI and a mammogram every year.  Breast cancer gene (BRCA) assessment is recommended for women who have family members with BRCA-related cancers. BRCA-related cancers include: ? Breast. ? Ovarian. ? Tubal. ? Peritoneal cancers.  Results of the assessment will determine the need for genetic counseling and BRCA1  and BRCA2 testing.  Cervical Cancer Your health care provider may recommend that you be screened regularly for cancer of the pelvic organs (ovaries, uterus, and vagina). This screening involves a pelvic examination, including checking for microscopic changes to the surface of your cervix (Pap test). You may be encouraged to have this screening done every 3 years, beginning at age 77.  For women ages 67-65, health care providers may recommend pelvic exams and Pap testing every 3 years, or they may recommend the Pap and pelvic exam, combined with testing for human papilloma virus (HPV), every 5 years. Some types of HPV increase your risk of cervical cancer. Testing for HPV may also be done on women of any age with unclear Pap test results.  Other health care providers may not recommend any screening for nonpregnant women who are considered low risk for pelvic cancer and who do not have symptoms. Ask your health care provider if a screening pelvic exam is right for you.  If you have had past treatment for cervical cancer or a condition that could lead to cancer, you need Pap tests and screening for cancer for at least 20 years after your treatment. If Pap tests have been discontinued, your risk factors (such as having a new sexual partner) need to be reassessed to determine if screening should resume. Some women have medical problems that increase the chance of getting cervical cancer. In these cases, your health care provider may recommend more frequent screening and Pap tests.  Colorectal Cancer  This type of cancer can be detected and often prevented.  Routine colorectal cancer screening usually begins at 60 years of age and continues through 60 years of age.  Your health care provider may recommend screening at an earlier age if you have risk factors for colon cancer.  Your health care provider may also recommend using home test kits to check for hidden blood in the stool.  A small camera at  the end of a tube can be used to examine your colon directly (sigmoidoscopy or colonoscopy). This is done to check for the earliest forms of colorectal cancer.  Routine screening usually begins at age 13.  Direct examination of the colon should be repeated every 5-10 years through 60 years of age. However, you may need to be screened more often if early forms of precancerous polyps or small growths are found.  Skin Cancer  Check your skin from head to toe regularly.  Tell your health care provider about any new moles or changes in moles, especially if there is a change in a mole's shape or color.  Also tell your health care provider if you have a mole that is larger than the size of a pencil eraser.  Always use sunscreen. Apply sunscreen liberally and repeatedly throughout the day.  Protect yourself by wearing long sleeves, pants, a wide-brimmed hat, and sunglasses whenever you are outside.  Heart disease, diabetes, and high blood pressure  High blood pressure causes heart disease and increases the risk of stroke. High blood pressure is more likely to develop in: ? People who have blood pressure in the high end of the normal range (130-139/85-89 mm Hg). ? People who are overweight or obese. ? People who are African American.  If you are 70-66 years of age, have your blood pressure checked every 3-5 years. If you are 66 years of age or older, have your blood pressure checked every year. You should have your blood pressure measured twice-once when you are at a hospital or clinic, and once when you are not at a hospital or clinic. Record the average of the two measurements. To check your blood pressure when you are not at a hospital or clinic, you can use: ? An automated blood pressure machine at a pharmacy. ? A home blood pressure monitor.  If you are between 34 years and 32 years old, ask your health care provider if you should take aspirin to prevent strokes.  Have regular diabetes  screenings. This involves taking a blood sample to check your fasting blood sugar level. ? If you are at a normal weight and have a low risk for diabetes, have this test once every three years after 60 years of age. ? If you are overweight and have a high risk for diabetes, consider being tested at a younger age or more often. Preventing infection Hepatitis B  If you have a higher risk for hepatitis B, you should be screened for this virus. You are considered at high risk for hepatitis B if: ? You were born in a country where hepatitis B is common. Ask your health care provider which countries are considered high risk. ? Your parents were born in a high-risk country, and you have not been immunized against hepatitis B (hepatitis B vaccine). ? You have HIV or AIDS. ? You use needles to inject street drugs. ? You live with someone who has hepatitis B. ? You have had sex with someone who has hepatitis B. ? You get hemodialysis treatment. ? You take certain medicines for conditions, including cancer, organ transplantation, and autoimmune conditions.  Hepatitis C  Blood testing is recommended for: ? Everyone born from [birth year] through 1964. ? Anyone with known risk factors for hepatitis C.  Sexually transmitted infections (STIs)  You should be screened for sexually transmitted infections (STIs) including gonorrhea and chlamydia if: ? You are sexually active and are younger than 60 years of age. ? You are older than 60 years of age and your health care provider tells you that you are at risk for this type of infection. ? Your sexual activity has changed since you were last screened and you are at an increased risk for chlamydia or gonorrhea. Ask your health care provider if you are at risk.  If you do not have HIV, but are at risk, it may be recommended that you take a prescription medicine daily to prevent HIV infection. This is called pre-exposure prophylaxis (PrEP). You are considered at risk  if: ? You are sexually active and do not regularly use condoms or know the HIV status of  your partner(s). ? You take drugs by injection. ? You are sexually active with a partner who has HIV.  Talk with your health care provider about whether you are at high risk of being infected with HIV. If you choose to begin PrEP, you should first be tested for HIV. You should then be tested every 3 months for as long as you are taking PrEP. Pregnancy  If you are premenopausal and you may become pregnant, ask your health care provider about preconception counseling.  If you may become pregnant, take 400 to 800 micrograms (mcg) of folic acid every day.  If you want to prevent pregnancy, talk to your health care provider about birth control (contraception). Osteoporosis and menopause  Osteoporosis is a disease in which the bones lose minerals and strength with aging. This can result in serious bone fractures. Your risk for osteoporosis can be identified using a bone density scan.  If you are 79 years of age or older, or if you are at risk for osteoporosis and fractures, ask your health care provider if you should be screened.  Ask your health care provider whether you should take a calcium or vitamin D supplement to lower your risk for osteoporosis.  Menopause may have certain physical symptoms and risks.  Hormone replacement therapy may reduce some of these symptoms and risks. Talk to your health care provider about whether hormone replacement therapy is right for you. Follow these instructions at home:  Schedule regular health, dental, and eye exams.  Stay current with your immunizations.  Do not use any tobacco products including cigarettes, chewing tobacco, or electronic cigarettes.  If you are pregnant, do not drink alcohol.  If you are breastfeeding, limit how much and how often you drink alcohol.  Limit alcohol intake to no more than 1 drink per day for nonpregnant women. One drink  equals 12 ounces of beer, 5 ounces of wine, or 1 ounces of hard liquor.  Do not use street drugs.  Do not share needles.  Ask your health care provider for help if you need support or information about quitting drugs.  Tell your health care provider if you often feel depressed.  Tell your health care provider if you have ever been abused or do not feel safe at home. This information is not intended to replace advice given to you by your health care provider. Make sure you discuss any questions you have with your health care provider. Document Released: 05/11/2011 Document Revised: 04/02/2016 Document Reviewed: 07/30/2015 Elsevier Interactive Patient Education  [employer].

## 2017-08-02 MED ORDER — ESTROGENS CONJUGATED 0.625 MG PO TABS: 0.6250 mg | ORAL_TABLET | Freq: Every day | ORAL | 0 refills | Status: DC

## 2017-08-04 ENCOUNTER — Encounter: Payer: Self-pay | Admitting: Family Medicine

## 2017-08-04 ENCOUNTER — Ambulatory Visit (INDEPENDENT_AMBULATORY_CARE_PROVIDER_SITE_OTHER): Payer: Medicare HMO | Admitting: Family Medicine

## 2017-08-04 VITALS — BP 120/80 | HR 76 | Temp 98.3°F | Resp 16 | Ht 69.0 in | Wt 231.5 lb

## 2017-08-04 DIAGNOSIS — E559 Vitamin D deficiency, unspecified: Secondary | ICD-10-CM | POA: Diagnosis not present

## 2017-08-04 DIAGNOSIS — Z1211 Encounter for screening for malignant neoplasm of colon: Secondary | ICD-10-CM | POA: Diagnosis not present

## 2017-08-04 DIAGNOSIS — I10 Essential (primary) hypertension: Secondary | ICD-10-CM | POA: Diagnosis not present

## 2017-08-04 DIAGNOSIS — Z0000 Encounter for general adult medical examination without abnormal findings: Secondary | ICD-10-CM

## 2017-08-04 DIAGNOSIS — E785 Hyperlipidemia, unspecified: Secondary | ICD-10-CM | POA: Diagnosis not present

## 2017-08-04 LAB — HEPATIC FUNCTION PANEL
ALBUMIN: 4.4 g/dL (ref 3.5–5.2)
ALT: 20 U/L (ref 0–35)
AST: 18 U/L (ref 0–37)
Alkaline Phosphatase: 32 U/L — ABNORMAL LOW (ref 39–117)
Bilirubin, Direct: 0.1 mg/dL (ref 0.0–0.3)
Total Bilirubin: 0.7 mg/dL (ref 0.2–1.2)
Total Protein: 6.8 g/dL (ref 6.0–8.3)

## 2017-08-04 LAB — CBC WITH DIFFERENTIAL/PLATELET
BASOS PCT: 0.2 % (ref 0.0–3.0)
Basophils Absolute: 0 10*3/uL (ref 0.0–0.1)
EOS PCT: 0 % (ref 0.0–5.0)
Eosinophils Absolute: 0 10*3/uL (ref 0.0–0.7)
HEMATOCRIT: 38.9 % (ref 36.0–46.0)
HEMOGLOBIN: 13.1 g/dL (ref 12.0–15.0)
LYMPHS PCT: 26.8 % (ref 12.0–46.0)
Lymphs Abs: 0.8 10*3/uL (ref 0.7–4.0)
MCHC: 33.6 g/dL (ref 30.0–36.0)
MCV: 91.3 fl (ref 78.0–100.0)
MONO ABS: 0.3 10*3/uL (ref 0.1–1.0)
MONOS PCT: 9.4 % (ref 3.0–12.0)
Neutro Abs: 2 10*3/uL (ref 1.4–7.7)
Neutrophils Relative %: 63.6 % (ref 43.0–77.0)
Platelets: 180 10*3/uL (ref 150.0–400.0)
RBC: 4.26 Mil/uL (ref 3.87–5.11)
RDW: 13.9 % (ref 11.5–15.5)
WBC: 3.1 10*3/uL — AB (ref 4.0–10.5)

## 2017-08-04 LAB — BASIC METABOLIC PANEL
BUN: 18 mg/dL (ref 6–23)
CALCIUM: 9.3 mg/dL (ref 8.4–10.5)
CO2: 27 mEq/L (ref 19–32)
Chloride: 96 mEq/L (ref 96–112)
Creatinine, Ser: 0.64 mg/dL (ref 0.40–1.20)
GFR: 100.61 mL/min (ref >60.00)
GLUCOSE: 159 mg/dL — AB (ref 70–99)
Potassium: 3.7 mEq/L (ref 3.5–5.1)
SODIUM: 135 meq/L (ref 135–145)

## 2017-08-04 LAB — TSH: TSH: 1.65 u[IU]/mL (ref 0.35–4.50)

## 2017-08-04 LAB — VITAMIN D 25 HYDROXY (VIT D DEFICIENCY, FRACTURES): VITD: 16.66 ng/mL — ABNORMAL LOW (ref 30.00–100.00)

## 2017-08-04 LAB — LDL CHOLESTEROL, DIRECT: Direct LDL: 109 mg/dL

## 2017-08-04 LAB — LIPID PANEL
CHOLESTEROL: 302 mg/dL — AB (ref 0–200)
HDL: 41.3 mg/dL (ref >39.00)
Total CHOL/HDL Ratio: 7

## 2017-08-04 NOTE — Progress Notes (Signed)
Pre visit review using our clinic review tool, if applicable. No additional management support is needed unless otherwise documented below in the visit note. 

## 2017-08-04 NOTE — Assessment & Plan Note (Signed)
Chronic problem.  Pt struggles w/ lipid management and complains about statin side effects.  Stressed need for healthy diet and regular exercise.  Check labs.  Adjust meds prn

## 2017-08-04 NOTE — Progress Notes (Signed)
   Subjective:    Patient ID: [name], female    DOB: [birth year], 61 y.o.   MRN: [id number]  HPI CPE- UTD on mammo.  No need for pap due to hysterectomy.  Due for repeat colonoscopy.  UTD on pneumovax, flu, and Tdap.   Review of Systems Patient reports no vision/ hearing changes, adenopathy,fever, weight change,  persistant/recurrent hoarseness , swallowing issues, chest pain, palpitations, edema, persistant/recurrent cough, hemoptysis, dyspnea (rest/exertional/paroxysmal nocturnal), gastrointestinal bleeding (melena, rectal bleeding), abdominal pain, significant heartburn, bowel changes, GU symptoms (dysuria, hematuria, incontinence), Gyn symptoms (abnormal  bleeding, pain),  syncope, focal weakness, memory loss, numbness & tingling, skin/hair/nail changes, abnormal bruising or bleeding, anxiety, or depression.     Objective:   Physical Exam General Appearance:    Alert, cooperative, no distress, appears stated age, obese  Head:    Normocephalic, without obvious abnormality, atraumatic  Eyes:    PERRL, conjunctiva/corneas clear, EOM's intact, fundi    benign, both eyes  Ears:    Normal TM's and external ear canals, both ears  Nose:   Nares normal, septum midline, mucosa normal, no drainage    or sinus tenderness  Throat:   Lips, mucosa, and tongue normal; teeth and gums normal  Neck:   Supple, symmetrical, trachea midline, no adenopathy;    Thyroid: no enlargement/tenderness/nodules  Back:     Symmetric, no curvature, ROM normal, no CVA tenderness  Lungs:     Clear to auscultation bilaterally, respirations unlabored  Chest Wall:    No tenderness or deformity   Heart:    Regular rate and rhythm, S1 and S2 normal, no murmur, rub   or gallop  Breast Exam:    Deferred to GYN  Abdomen:     Soft, non-tender, bowel sounds active all four quadrants,    no masses, no organomegaly  Genitalia:    Deferred to GYN  Rectal:    Extremities:   Extremities normal, atraumatic, no cyanosis or  edema  Pulses:   2+ and symmetric all extremities  Skin:   Skin color, texture, turgor normal, no rashes or lesions  Lymph nodes:   Cervical, supraclavicular, and axillary nodes normal  Neurologic:   CNII-XII intact, normal strength, sensation and reflexes    throughout          Assessment & Plan:

## 2017-08-04 NOTE — Patient Instructions (Signed)
Follow up in 6 months to recheck BP and cholesterol We'll notify you of your lab results and make any changes if needed Continue to work on healthy diet and regular exercise- you can do it!!! We'll call you with your GI appt for the repeat colonoscopy Call with any questions or concerns [place]!!!!

## 2017-08-04 NOTE — Assessment & Plan Note (Signed)
Check labs.  Replete prn. 

## 2017-08-04 NOTE — Assessment & Plan Note (Signed)
Pt's PE unchanged from previous.  UTD on mammo.  Due for recall colonoscopy- referral placed.  UTD on immunizations.  Check labs.  Anticipatory guidance provided.

## 2017-08-04 NOTE — Assessment & Plan Note (Signed)
Chronic problem, well controlled today.  Asymptomatic.  Check labs.  No anticipated med changes. 

## 2017-08-05 ENCOUNTER — Other Ambulatory Visit (INDEPENDENT_AMBULATORY_CARE_PROVIDER_SITE_OTHER): Payer: Medicare HMO

## 2017-08-05 DIAGNOSIS — R7309 Other abnormal glucose: Secondary | ICD-10-CM | POA: Diagnosis not present

## 2017-08-05 LAB — HEMOGLOBIN A1C: HEMOGLOBIN A1C: 6.6 % — AB (ref 4.6–6.5)

## 2017-08-06 ENCOUNTER — Other Ambulatory Visit: Payer: Self-pay | Admitting: General Practice

## 2017-08-06 DIAGNOSIS — E119 Type 2 diabetes mellitus without complications: Secondary | ICD-10-CM

## 2017-08-06 DIAGNOSIS — E785 Hyperlipidemia, unspecified: Secondary | ICD-10-CM

## 2017-08-06 MED ORDER — VITAMIN D (ERGOCALCIFEROL) 1.25 MG (50000 UNIT) PO CAPS: 50000.0000 [IU] | ORAL_CAPSULE | ORAL | 0 refills | Status: DC

## 2017-08-06 NOTE — Progress Notes (Signed)
Called pt and lmovm to return call.

## 2017-08-18 DIAGNOSIS — R69 Illness, unspecified: Secondary | ICD-10-CM | POA: Diagnosis not present

## 2017-08-24 NOTE — Progress Notes (Deleted)
Patient ID: [name]                 DOB: [birth year]                    MRN: [id number]     HPI: [name] is a 61 y.o. female patient referred to lipid clinic by ***. PMH is significant for   Current Medications:  Intolerances:  Risk Factors:  LDL goal:   Diet:   Exercise:   Family History:   Social History:   Labs:  Past Medical History:  Diagnosis Date  . Allergic rhinitis   . Anxiety   . Arthritis    R knee, Left Knee  . Asthma   . Bipolar disorder ([place])   . Cataract   . Complication of anesthesia    had bad anxiety after anesthesia  . Constipation   . Constipation, chronic   . Depression   . Dysrhythmia    Palpations occ, takes Tenormin  . GERD (gastroesophageal reflux disease)   . Heart murmur   . Hyperlipidemia   . MVP (mitral valve prolapse)   . Urinary tract infection    frequent, see Urololgist 12/16/2010    Current Outpatient Prescriptions on File Prior to Visit  Medication Sig Dispense Refill  . clonazePAM (KLONOPIN) 2 MG tablet 1 mg 4 (four) times daily as needed.   0  . estrogens, conjugated, (PREMARIN) 0.625 MG tablet Take 1 tablet (0.625 mg total) by mouth daily. 90 tablet 0  . gabapentin (NEURONTIN) 600 MG tablet Take 600 mg by mouth 4 (four) times daily.     Marland Kitchen gemfibrozil (LOPID) 600 MG tablet TAKE 1 TABLET (600 MG TOTAL) BY MOUTH 2 (TWO) TIMES DAILY BEFORE A MEAL. 30 tablet 3  . hydrochlorothiazide (HYDRODIURIL) 12.5 MG tablet Take 1 tablet (12.5 mg total) by mouth daily. 30 tablet 5  . lamoTRIgine (LAMICTAL) 200 MG tablet Take 200 mg by mouth 2 (two) times daily.     . metoprolol succinate (TOPROL-XL) 50 MG 24 hr tablet TAKE 1 TABLET BY MOUTH EVERY DAY *WITH OR IMMEDIATELY FOLLOWING A MEAL* 30 tablet 3  . OLANZapine (ZYPREXA) 10 MG tablet Take 10 mg by mouth daily as needed.    . Omega-3 Fatty Acids (FISH OIL) 1000 MG CAPS Take 1 capsule by mouth daily.    . QUEtiapine (SEROQUEL XR) 400 MG 24 hr tablet TK 1 T PO QPM WF  0  .  QUEtiapine (SEROQUEL) 50 MG tablet TK 1 T PO TID  3  . QVAR 80 MCG/ACT inhaler INHALE 2 PUFFS INTO THE LUNGS 2 (TWO) TIMES DAILY. 8.7 g 0  . venlafaxine (EFFEXOR-XR) 75 MG 24 hr capsule Take 225 mg by mouth daily.     . Vitamin D, Ergocalciferol, (DRISDOL) 50000 units CAPS capsule Take 1 capsule (50,000 Units total) by mouth every 7 (seven) days. 12 capsule 0   No current facility-administered medications on file prior to visit.     Allergies  Allergen Reactions  . Nitrofurantoin Monohyd Macro Nausea Only    Severe headache  . Sulfa Antibiotics Nausea And Vomiting  . Alprazolam Anxiety and Other (See Comments)    Hyper   . Amoxicillin Rash  . Prednisone Anxiety    Assessment/Plan:  1. Hyperlipidemia -

## 2017-08-27 DIAGNOSIS — E119 Type 2 diabetes mellitus without complications: Secondary | ICD-10-CM | POA: Diagnosis not present

## 2017-08-31 ENCOUNTER — Ambulatory Visit (INDEPENDENT_AMBULATORY_CARE_PROVIDER_SITE_OTHER): Payer: Medicare HMO | Admitting: Pharmacist

## 2017-08-31 DIAGNOSIS — E782 Mixed hyperlipidemia: Secondary | ICD-10-CM | POA: Diagnosis not present

## 2017-08-31 MED ORDER — FENOFIBRATE 145 MG PO TABS: 145.0000 mg | ORAL_TABLET | Freq: Every day | ORAL | 5 refills | Status: DC

## 2017-08-31 MED ORDER — OMEGA-3-ACID ETHYL ESTERS 1 G PO CAPS: ORAL_CAPSULE | ORAL | 1 refills | Status: DC

## 2017-08-31 NOTE — Progress Notes (Signed)
Patient ID: [name]                 DOB: 1955/11/30                    MRN: [id number]     HPI: [name] is a 61 y.o. female patient referred to lipid clinic by Dr Irish Lack. PMH is significant for high blood pressure, sleep apnea, palpitations, hyperlipidemia, and new diagnosed diabetic. Most recent triglycerides (3 weeks ago) were 983 mg/dL on Gemfibrozil 600mg  twice daily and Fish oil 1000mg  daily. Noted patient was seen in the past by our lipid clinic and was managed with fenofibrate and rosuvastatin. She reports intolerance to ezetimibe, niaspan, atorvastatin and rosuvastatin 40mg .   Current Medications:  Gemfibrozil 600mg  twice daily - not very compliant with it. Difficult to remember right before the meals Fish-oil 1000mg  daily  Intolerances:  Zetia (ezetimibe) - GI discomfort and muscle pain Niaspan - intolerable flushing Atorvastatin - severe muscle pain Rosuvastatin 40mg  - severe muscle pain  Risk factors: dyslipidemia;obesity (BMI >30kg/m2);sedentary lifestyle;family history of premature cardiovascular disease   Diet: cut out most sugar for diet, drinks mainly water, appointment with nutritional group tomorrow  Exercise: stated going to gym 2-3x/week; walking dog every day  Family History: CAD, MI at age 52 from father; hypertension and hyperlipidemia from brother  Social History: denies tobacco use; denies alcohol use  Labs:  08/04/2017: CHO 302; TG 983; HDL 41; LDL-d 109   08/04/2013: CHO 223; TG 211; HDL 61; LDL-d 144 (Crestor 20 mg + fenofibrate 160 mg)   Past Medical History:  Diagnosis Date  . Allergic rhinitis   . Anxiety   . Arthritis    R knee, Left Knee  . Asthma   . Bipolar disorder ([place])   . Cataract   . Complication of anesthesia    had bad anxiety after anesthesia  . Constipation   . Constipation, chronic   . Depression   . Dysrhythmia    Palpations occ, takes Tenormin  . GERD (gastroesophageal reflux disease)   . Heart murmur   .  Hyperlipidemia   . MVP (mitral valve prolapse)   . Urinary tract infection    frequent, see Urololgist 12/16/2010    Current Outpatient Prescriptions on File Prior to Visit  Medication Sig Dispense Refill  . clonazePAM (KLONOPIN) 2 MG tablet 1 mg 4 (four) times daily as needed.   0  . estrogens, conjugated, (PREMARIN) 0.625 MG tablet Take 1 tablet (0.625 mg total) by mouth daily. 90 tablet 0  . gabapentin (NEURONTIN) 600 MG tablet Take 600 mg by mouth 4 (four) times daily.     . hydrochlorothiazide (HYDRODIURIL) 12.5 MG tablet Take 1 tablet (12.5 mg total) by mouth daily. 30 tablet 5  . lamoTRIgine (LAMICTAL) 200 MG tablet Take 200 mg by mouth 2 (two) times daily.     . metoprolol succinate (TOPROL-XL) 50 MG 24 hr tablet TAKE 1 TABLET BY MOUTH EVERY DAY *WITH OR IMMEDIATELY FOLLOWING A MEAL* 30 tablet 3  . OLANZapine (ZYPREXA) 10 MG tablet Take 10 mg by mouth daily as needed.    . Omega-3 Fatty Acids (FISH OIL) 1000 MG CAPS Take 1 capsule by mouth daily.    . QUEtiapine (SEROQUEL XR) 400 MG 24 hr tablet TK 1 T PO QPM WF  0  . QUEtiapine (SEROQUEL) 50 MG tablet TK 1 T PO TID  3  . QVAR 80 MCG/ACT inhaler INHALE 2 PUFFS INTO THE  LUNGS 2 (TWO) TIMES DAILY. 8.7 g 0  . venlafaxine (EFFEXOR-XR) 75 MG 24 hr capsule Take 225 mg by mouth daily.     . Vitamin D, Ergocalciferol, (DRISDOL) 50000 units CAPS capsule Take 1 capsule (50,000 Units total) by mouth every 7 (seven) days. 12 capsule 0   No current facility-administered medications on file prior to visit.     Allergies  Allergen Reactions  . Nitrofurantoin Monohyd Macro Nausea Only    Severe headache  . Sulfa Antibiotics Nausea And Vomiting  . Alprazolam Anxiety and Other (See Comments)    Hyper   . Amoxicillin Rash  . Prednisone Anxiety    Hyperlipidemia Triglycerides and LDL are extremely elevated wit TG at 900s. Patient has a new diagnosis of diabetes with A1C at 6.6% as well. She reports poor compliance with her gemfibrozil,  fish-oil therapy, diet and exercise for the past couple of years. Due to recent changes in health she got "a wake up call" and started going to the gym about 1 week ago. Also cut most sugar out of her diet and has an appointment to see the nutritionist tomorrow. Noted has intolerance to ezetimibe, niaspan, atorvastatin and crestor 40mg .   Will increase fish-oil from 1 gram daily to 4 grams daily ( 2 grams daily x 1 week, then increase to 4grams daily), discontinue gemfibrozil, and initiate fenofibrate 145mg  daily. Patient understands needs to decrease TG before further attempts to decrease LDL.  Lovaza was prescribed for the patient ,to replace OTC fish-oil;  but co-pay is $66 and patient is not able to afford at this time. She request leaving Rx active with plans to get medication as soon as possible. Plan to call patient in 2 weeks to determine tolerability, then repeat Lipid profile in 4 weeks if patient tolerating therapy.   Ethelda Deangelo Rodriguez-Guzman PharmD, BCPS, [place] [id number] 08/31/2017 7:43 PM

## 2017-08-31 NOTE — Assessment & Plan Note (Signed)
Triglycerides and LDL are extremely elevated wit TG at 900s. Patient has a new diagnosis of diabetes with A1C at 6.6% as well. She reports poor compliance with her gemfibrozil, fish-oil therapy, diet and exercise for the past couple of years. Due to recent changes in health she got "a wake up call" and started going to the gym about 1 week ago. Also cut most sugar out of her diet and has an appointment to see the nutritionist tomorrow. Noted has intolerance to ezetimibe, niaspan, atorvastatin and crestor 40mg .   Will increase fish-oil from 1 gram daily to 4 grams daily ( 2 grams daily x 1 week, then increase to 4grams daily), discontinue gemfibrozil, and initiate fenofibrate 145mg  daily. Patient understands needs to decrease TG before further attempts to decrease LDL.  Lovaza was prescribed for the patient ,to replace OTC fish-oil;  but co-pay is $66 and patient is not able to afford at this time. She request leaving Rx active with plans to get medication as soon as possible. Plan to call patient in 2 weeks to determine tolerability, then repeat Lipid profile in 4 weeks if patient tolerating therapy.

## 2017-08-31 NOTE — Patient Instructions (Addendum)
Lipid clinic [phone] (Sosie Gato/[place])  **START taking fish-oil 1 capsule twice daily x 1 week, then increase to 2 capsules twice daily **STOP taking gemfibrozil **START taking fenofibrate 145mg  daily ** Repeat fasting blood work in 4 weeks  Food Choices to Lower Your Triglycerides Triglycerides are a type of fat in your blood. High levels of triglycerides can increase the risk of heart disease and stroke. If your triglyceride levels are high, the foods you eat and your eating habits are very important. Choosing the right foods can help lower your triglycerides. What general guidelines do I need to follow?  Lose weight if you are overweight.  Limit or avoid alcohol.  Fill one half of your plate with vegetables and green salads.  Limit fruit to two servings a day. Choose fruit instead of juice.  Make one fourth of your plate whole grains. Look for the word "whole" as the first word in the ingredient list.  Fill one fourth of your plate with lean protein foods.  Enjoy fatty fish (such as salmon, mackerel, sardines, and tuna) three times a week.  Choose healthy fats.  Limit foods high in starch and sugar.  Eat more home-cooked food and less restaurant, buffet, and fast food.  Limit fried foods.  Cook foods using methods other than frying.  Limit saturated fats.  Check ingredient lists to avoid foods with partially hydrogenated oils (trans fats) in them. What foods can I eat? Grains Whole grains, such as whole wheat or whole grain breads, crackers, cereals, and pasta. Unsweetened oatmeal, bulgur, barley, quinoa, or brown rice. Corn or whole wheat flour tortillas. Vegetables Fresh or frozen vegetables (raw, steamed, roasted, or grilled). Green salads. Fruits All fresh, canned (in natural juice), or frozen fruits. Meat and Other Protein Products Ground beef (85% or leaner), grass-fed beef, or beef trimmed of fat. Skinless chicken or [place]. Ground chicken or [place]. Pork  trimmed of fat. All fish and seafood. Eggs. Dried beans, peas, or lentils. Unsalted nuts or seeds. Unsalted canned or dry beans. Dairy Low-fat dairy products, such as skim or 1% milk, 2% or reduced-fat cheeses, low-fat ricotta or cottage cheese, or plain low-fat yogurt. Fats and Oils Tub margarines without trans fats. Light or reduced-fat mayonnaise and salad dressings. Avocado. Safflower, olive, or canola oils. Natural peanut or almond butter. The items listed above may not be a complete list of recommended foods or beverages. Contact your dietitian for more options. What foods are not recommended? Grains White bread. White pasta. White rice. Cornbread. Bagels, pastries, and croissants. Crackers that contain trans fat. Vegetables White potatoes. Corn. Creamed or fried vegetables. Vegetables in a cheese sauce. Fruits Dried fruits. Canned fruit in light or heavy syrup. Fruit juice. Meat and Other Protein Products Fatty cuts of meat. Ribs, chicken wings, bacon, sausage, bologna, salami, chitterlings, fatback, hot dogs, bratwurst, and packaged luncheon meats. Dairy Whole or 2% milk, cream, half-and-half, and cream cheese. Whole-fat or sweetened yogurt. Full-fat cheeses. Nondairy creamers and whipped toppings. Processed cheese, cheese spreads, or cheese curds. Sweets and Desserts Corn syrup, sugars, honey, and molasses. Candy. Jam and jelly. Syrup. Sweetened cereals. Cookies, pies, cakes, donuts, muffins, and ice cream. Fats and Oils Butter, stick margarine, lard, shortening, ghee, or bacon fat. Coconut, palm kernel, or palm oils. Beverages Alcohol. Sweetened drinks (such as sodas, lemonade, and fruit drinks or punches). The items listed above may not be a complete list of foods and beverages to avoid. Contact your dietitian for more information. This information is not intended to replace  advice given to you by your health care provider. Make sure you discuss any questions you have with your  health care provider. Document Released: 08/13/2004 Document Revised: 04/02/2016 Document Reviewed: 08/30/2013 Elsevier Interactive Patient Education  2017 [employer] Inc[employer]    Cholesterol Cholesterol is a fat. Your body needs a small amount of cholesterol. Cholesterol (plaque) may build up in your blood vessels (arteries). That makes you more likely to have a heart attack or stroke. You cannot feel your cholesterol level. Having a blood test is the only way to find out if your level is high. Keep your test results. Work with your doctor to keep your cholesterol at a good level. What do the results mean?  Total cholesterol is how much cholesterol is in your blood.  LDL is bad cholesterol. This is the type that can build up. Try to have low LDL.  HDL is good cholesterol. It cleans your blood vessels and carries LDL away. Try to have high HDL.  Triglycerides are fat that the body can store or burn for energy. What are good levels of cholesterol?  Total cholesterol below 200.  LDL below 100 is good for people who have health risks. LDL below 70 is good for people who have very high risks.  HDL above 40 is good. It is best to have HDL of 60 or higher.  Triglycerides below 150. How can I lower my cholesterol? Diet Follow your diet program as told by your doctor.  Choose fish, white meat chicken, or [place] that is roasted or baked. Try not to eat red meat, fried foods, sausage, or lunch meats.  Eat lots of fresh fruits and vegetables.  Choose whole grains, beans, pasta, potatoes, and cereals.  Choose olive oil, corn oil, or canola oil. Only use small amounts.  Try not to eat butter, mayonnaise, shortening, or palm kernel oils.  Try not to eat foods with trans fats.  Choose low-fat or nonfat dairy foods. ? Drink skim or nonfat milk. ? Eat low-fat or nonfat yogurt and cheeses. ? Try not to drink whole milk or cream. ? Try not to eat ice cream, egg yolks, or full-fat  cheeses.  Healthy desserts include angel food cake, ginger snaps, animal crackers, hard candy, popsicles, and low-fat or nonfat frozen yogurt. Try not to eat pastries, cakes, pies, and cookies.  Exercise Follow your exercise program as told by your doctor.  Be more active. Try gardening, walking, and taking the stairs.  Ask your doctor about ways that you can be more active.  Medicine  Take over-the-counter and prescription medicines only as told by your doctor.  This information is not intended to replace advice given to you by your health care provider. Make sure you discuss any questions you have with your health care provider. Document Released: 01/22/2009 Document Revised: 05/27/2016 Document Reviewed: 05/07/2016 Elsevier Interactive Patient Education  2017 [employer].

## 2017-09-01 DIAGNOSIS — R69 Illness, unspecified: Secondary | ICD-10-CM | POA: Diagnosis not present

## 2017-09-02 ENCOUNTER — Encounter: Payer: Medicare HMO | Attending: Family Medicine | Admitting: Dietician

## 2017-09-02 ENCOUNTER — Encounter: Payer: Self-pay | Admitting: Dietician

## 2017-09-02 DIAGNOSIS — E119 Type 2 diabetes mellitus without complications: Secondary | ICD-10-CM | POA: Diagnosis not present

## 2017-09-02 DIAGNOSIS — Z713 Dietary counseling and surveillance: Secondary | ICD-10-CM | POA: Insufficient documentation

## 2017-09-02 DIAGNOSIS — G4733 Obstructive sleep apnea (adult) (pediatric): Secondary | ICD-10-CM | POA: Diagnosis not present

## 2017-09-02 NOTE — Patient Instructions (Signed)
Great job on the great changes that you have made! Aim to be mindful.  Eat slowly and stop when you are satisfied.  Eat at the table.  When serving a snack:  Put a portion in a bowl and enjoy at the table. Continue the vitamin D. Continue to find ways to cope with your stress.  [place] with your dog Each day find something that brings you joy. Continue to stay active.  Aim for 30 minutes most days.  Aim for 2-3 Carb Choices per meal (30-45 grams) +/- 1 either way  Aim for 0-1 Carbs per snack if hungry  Include protein in moderation with your meals and snacks Consider reading food labels for Total Carbohydrate and Fat Grams of foods

## 2017-09-02 NOTE — Progress Notes (Signed)
Diabetes Self-Management Education  Visit Type: First/Initial  Appt. Start Time: 1015 Appt. End Time: 9485  09/02/2017  [name], identified by name and date of birth, is a 61 y.o. female with a diagnosis of Diabetes: Type 2. Other hx includes bipolar disorder, HTN, hyperlipidemia (she sees a lipid specialist), OSA on c-pap, constipation, GERD. Labs include Cholesterol 302, HDL 41, LDL 109, triglycerides 983 (08/04/17).  Her vitamin D was also 16 and she has been started on the high weekly dose.  Her A1C was 6.6% and she is currently not on any medications for this.  Weight hx: 231 lbs 1 month ago 226 lbs today. She has been decreasing her sugar intake and working to decrease stress eating.  Patient lives alone.  She worked for [employer] until they downsized.  She is on disability.  She has a pet sitting business and volunteers as well.  She has enough money for food but budget is tight.    ASSESSMENT  Height 5\' 9"  (1.753 m), weight 226 lb (102.5 kg), last menstrual period 11/09/2000. Body mass index is 33.37 kg/m.      Diabetes Self-Management Education - 09/02/17 1030      Visit Information   Visit Type First/Initial     Initial Visit   Diabetes Type Type 2   Are you currently following a meal plan? No   Are you taking your medications as prescribed? Not on Medications   Date Diagnosed 08/05/17     Health Coping   How would you rate your overall health? Fair     Psychosocial Assessment   Patient Belief/Attitude about Diabetes Motivated to manage diabetes   Self-care barriers None   Self-management support Doctor's office   Other persons present Patient   Patient Concerns Nutrition/Meal planning;Glycemic Control;Weight Control   Special Needs None   Preferred Learning Style No preference indicated   Learning Readiness Ready   How often do you need to have someone help you when you read instructions, pamphlets, or other written materials from your doctor or pharmacy? 1  - Never   What is the last grade level you completed in school? 3 years college     Pre-Education Assessment   Patient understands the diabetes disease and treatment process. Needs Instruction   Patient understands incorporating nutritional management into lifestyle. Needs Instruction   Patient undertands incorporating physical activity into lifestyle. Needs Instruction   Patient understands using medications safely. Needs Instruction   Patient understands monitoring blood glucose, interpreting and using results Needs Instruction   Patient understands prevention, detection, and treatment of acute complications. Needs Instruction   Patient understands prevention, detection, and treatment of chronic complications. Needs Instruction   Patient understands how to develop strategies to address psychosocial issues. Needs Instruction   Patient understands how to develop strategies to promote health/change behavior. Needs Instruction     Complications   Last HgB A1C per patient/outside source 6.6 %  07/2017   How often do you check your blood sugar? Not recommended by provider   Have you had a dilated eye exam in the past 12 months? Yes   Have you had a dental exam in the past 12 months? Yes   Are you checking your feet? Yes   How many days per week are you checking your feet? 7     Dietary Intake   Breakfast instant oatmeal, oatbran OR scrambled eggs, [place] bacon, coffee with coffee mate  8:30   Snack (morning) apple   Lunch occasional  1/2 hamburger   Snack (afternoon) none   Dinner brown rice, pinto beans, salsa OR grilled chicken, vegetables  5-6   Snack (evening) occasional yogurt   Beverage(s) water, coffee with coffee mate, sugar free lemonade, unsweetened iced tea     Exercise   Exercise Type Light (walking / raking leaves)   How many days per week to you exercise? 7   How many minutes per day do you exercise? 60   Total minutes per week of exercise 420     Patient Education    Previous Diabetes Education No   Disease state  Definition of diabetes, type 1 and 2, and the diagnosis of diabetes   Nutrition management  Role of diet in the treatment of diabetes and the relationship between the three main macronutrients and blood glucose level;Food label reading, portion sizes and measuring food.;Information on hints to eating out and maintain blood glucose control.;Meal options for control of blood glucose level and chronic complications.   Physical activity and exercise  Role of exercise on diabetes management, blood pressure control and cardiac health.   Monitoring Purpose and frequency of SMBG.;Identified appropriate SMBG and/or A1C goals.   Chronic complications Relationship between chronic complications and blood glucose control   Psychosocial adjustment Worked with patient to identify barriers to care and solutions;Role of stress on diabetes;Identified and addressed patients feelings and concerns about diabetes   Personal strategies to promote health Lifestyle issues that need to be addressed for better diabetes care     Individualized Goals (developed by patient)   Nutrition General guidelines for healthy choices and portions discussed   Physical Activity Exercise 5-7 days per week;30 minutes per day   Medications Not Applicable   Monitoring  Not Applicable   Reducing Risk do foot checks daily;increase portions of nuts and seeds   Health Coping discuss diabetes with (comment)  MD/RD     Post-Education Assessment   Patient understands the diabetes disease and treatment process. Demonstrates understanding / competency   Patient understands incorporating nutritional management into lifestyle. Demonstrates understanding / competency   Patient undertands incorporating physical activity into lifestyle. Demonstrates understanding / competency   Patient understands prevention, detection, and treatment of chronic complications. Demonstrates understanding / competency    Patient understands how to develop strategies to address psychosocial issues. Needs Review   Patient understands how to develop strategies to promote health/change behavior. Needs Review     Outcomes   Expected Outcomes Demonstrated interest in learning. Expect positive outcomes   Future DMSE 4-6 wks   Program Status Not Completed      Individualized Plan for Diabetes Self-Management Training:   Learning Objective:  Patient will have a greater understanding of diabetes self-management. Patient education plan is to attend individual and/or group sessions per assessed needs and concerns.   Plan:   Patient Instructions  Doristine Devoid job on the great changes that you have made! Aim to be mindful.  Eat slowly and stop when you are satisfied.  Eat at the table.  When serving a snack:  Put a portion in a bowl and enjoy at the table. Continue the vitamin D. Continue to find ways to cope with your stress.  [place] with your dog Each day find something that brings you joy. Continue to stay active.  Aim for 30 minutes most days.  Aim for 2-3 Carb Choices per meal (30-45 grams) +/- 1 either way  Aim for 0-1 Carbs per  snack if hungry  Include protein in moderation with your meals and snacks Consider reading food labels for Total Carbohydrate and Fat Grams of foods        Expected Outcomes:  Demonstrated interest in learning. Expect positive outcomes  Education material provided: Living Well with Diabetes, Food label handouts, A1C conversion sheet, Meal plan card, My Plate and Snack sheet, Vegetarian protein sources, plant based recipe resources  If problems or questions, patient to contact team via:  Phone  Future DSME appointment: 4-6 wks

## 2017-09-09 DIAGNOSIS — R69 Illness, unspecified: Secondary | ICD-10-CM | POA: Diagnosis not present

## 2017-09-15 DIAGNOSIS — R69 Illness, unspecified: Secondary | ICD-10-CM | POA: Diagnosis not present

## 2017-09-16 ENCOUNTER — Telehealth: Payer: Self-pay | Admitting: Pharmacist

## 2017-09-16 NOTE — Telephone Encounter (Signed)
LMOM; Patient to call back. Following low fat, lo carb diet? Tolerating fenofibrate and omega-3?

## 2017-09-20 ENCOUNTER — Telehealth: Payer: Self-pay | Admitting: *Deleted

## 2017-09-20 NOTE — Telephone Encounter (Signed)
error 

## 2017-09-28 ENCOUNTER — Other Ambulatory Visit: Payer: Self-pay | Admitting: General Practice

## 2017-09-28 DIAGNOSIS — I10 Essential (primary) hypertension: Secondary | ICD-10-CM

## 2017-09-28 DIAGNOSIS — R69 Illness, unspecified: Secondary | ICD-10-CM | POA: Diagnosis not present

## 2017-09-28 MED ORDER — HYDROCHLOROTHIAZIDE 12.5 MG PO TABS: 12.5000 mg | ORAL_TABLET | Freq: Every day | ORAL | 0 refills | Status: DC

## 2017-10-06 ENCOUNTER — Other Ambulatory Visit: Payer: Self-pay | Admitting: Family Medicine

## 2017-10-07 ENCOUNTER — Ambulatory Visit: Payer: Medicare HMO | Admitting: Dietician

## 2017-10-13 DIAGNOSIS — R69 Illness, unspecified: Secondary | ICD-10-CM | POA: Diagnosis not present

## 2017-10-14 ENCOUNTER — Encounter: Payer: Medicare HMO | Attending: Family Medicine | Admitting: Dietician

## 2017-10-14 DIAGNOSIS — E119 Type 2 diabetes mellitus without complications: Secondary | ICD-10-CM

## 2017-10-14 DIAGNOSIS — Z713 Dietary counseling and surveillance: Secondary | ICD-10-CM | POA: Diagnosis not present

## 2017-10-14 NOTE — Progress Notes (Signed)
Diabetes Self-Management Education  Visit Type: Follow-up  Appt. Start Time: 1110 Appt. End Time: 1140  10/14/2017  [name], identified by name and date of birth, is a 61 y.o. female with a diagnosis of Diabetes: Type 2. Other hx includes bipolar disorder, HTN, hyperlipidemia (she sees a lipid specialist), OSA on c-pap, constipation, GERD. Labs include Cholesterol 302, HDL 41, LDL 109, triglycerides 983 (08/04/17).  Her vitamin D was also 16 and she has finished the high weekly dose.  Her A1C was 6.6% and she is currently not on any medications for this.  Weight loss of 11 lbs in the past 6 weeks with lifestyle change.  She has not been able to exercise due to illness last week but is working to slowly add this back.  She is finding healthy alternatives to stress eating.  Weight hx: 231 lbs 1 month ago 226 lbs 09/02/17 215 lbs today She has been decreasing her sugar intake and working to decrease stress eating.  Patient lives alone.  She worked for [employer] until they downsized.  She is on disability.  She has a pet sitting business and volunteers as well.  She has enough money for food but budget is tight.     ASSESSMENT  Last menstrual period 11/09/2000. There is no height or weight on file to calculate BMI.  Diabetes Self-Management Education - 10/14/17 1146      Visit Information   Visit Type  Follow-up      Initial Visit   Diabetes Type  Type 2    Are you taking your medications as prescribed?  Not on Medications      Psychosocial Assessment   Patient Belief/Attitude about Diabetes  Motivated to manage diabetes    Patient Concerns  Nutrition/Meal planning;Glycemic Control;Weight Control      Pre-Education Assessment   Patient understands the diabetes disease and treatment process.  Demonstrates understanding / competency    Patient understands incorporating nutritional management into lifestyle.  Needs Review    Patient undertands incorporating physical activity into  lifestyle.  Demonstrates understanding / competency    Patient understands using medications safely.  Demonstrates understanding / competency    Patient understands monitoring blood glucose, interpreting and using results  Demonstrates understanding / competency    Patient understands prevention, detection, and treatment of acute complications.  Demonstrates understanding / competency    Patient understands prevention, detection, and treatment of chronic complications.  Demonstrates understanding / competency    Patient understands how to develop strategies to address psychosocial issues.  Needs Review    Patient understands how to develop strategies to promote health/change behavior.  Needs Review      Dietary Intake   Breakfast  steel cut oats with oat bran, sugar free maple syrup OR scrambled eggs, [place] bacon, coffee with coffee mate    Snack (morning)  appple or almonds    Lunch  veggies or omelet from [employer] (afternoon)  appple    Dinner  brown rice, pinto beans, salsa, chicken OR chicken and vegetables    Snack (evening)  occasional yogurt    Beverage(s)  water, coffee with coffee mate, sugar free lemonade, unsweetened iced tea      Exercise   Exercise Type  Light (walking / raking leaves)      Patient Education   Previous Diabetes Education  Yes (please comment) 6 weeks ago    Nutrition management   Meal options for control of blood glucose level  and chronic complications.;Other (comment) resources    Personal strategies to promote health  Lifestyle issues that need to be addressed for better diabetes care      Individualized Goals (developed by patient)   Nutrition  General guidelines for healthy choices and portions discussed    Physical Activity  Exercise 5-7 days per week;30 minutes per day    Medications  Not Applicable    Monitoring   Not Applicable    Reducing Risk  examine blood glucose patterns;increase portions of nuts and seeds    Health Coping   discuss diabetes with (comment) MD,RD,CDE      Patient Self-Evaluation of Goals - Patient rates self as meeting previously set goals (% of time)   Nutrition  >75%    Physical Activity  25 - 50% due to illness    Medications  Not Applicable    Monitoring  Not Applicable    Problem Solving  50 - 75 %    Reducing Risk  >75%    Health Coping  >75%      Post-Education Assessment   Patient understands the diabetes disease and treatment process.  Demonstrates understanding / competency    Patient understands incorporating nutritional management into lifestyle.  Needs Review    Patient undertands incorporating physical activity into lifestyle.  Demonstrates understanding / competency    Patient understands using medications safely.  Demonstrates understanding / competency    Patient understands monitoring blood glucose, interpreting and using results  Demonstrates understanding / competency    Patient understands prevention, detection, and treatment of acute complications.  Demonstrates understanding / competency    Patient understands how to develop strategies to address psychosocial issues.  Needs Review    Patient understands how to develop strategies to promote health/change behavior.  Needs Review      Outcomes   Expected Outcomes  Demonstrated interest in learning. Expect positive outcomes    Future DMSE  4-6 wks      Subsequent Visit   Since your last visit have you experienced any weight changes?  Loss    Weight Loss (lbs)  11       Individualized Plan for Diabetes Self-Management Training:   Learning Objective:  Patient will have a greater understanding of diabetes self-management. Patient education plan is to attend individual and/or group sessions per assessed needs and concerns.   Plan:   Patient Instructions   Find ways to increase your variety: [email or web address]  Consider 2000 units Vitamin D daily.  Great job on the great changes that  you have made! Aim to be mindful.             Eat slowly and stop when you are satisfied.             Eat at the table.             When serving a snack:  Put a portion in a bowl and enjoy at the table. Continue the vitamin D. Continue to find ways to cope with your stress.             [place] with your dog Each day find something that brings you joy.  Continue to stay active.  Aim for 30 minutes most days.  Aim for 2-3 Carb Choices per meal (30-45 grams) +/- 1 either way  Aim for 0-1 Carbs per snack if hungry  Include protein in moderation with your meals and snacks Consider reading food labels for Total Carbohydrate and Fat Grams of foods    Expected Outcomes:  Demonstrated interest in learning. Expect positive outcomes  Education material provided: none at this visit  If problems or questions, patient to contact team via:  Phone  Future DSME appointment: 4-6 wks

## 2017-10-14 NOTE — Patient Instructions (Addendum)
  Find ways to increase your variety: [email or web address]  Consider 2000 units Vitamin D daily.  Great job on the great changes that you have made! Aim to be mindful.             Eat slowly and stop when you are satisfied.             Eat at the table.             When serving a snack:  Put a portion in a bowl and enjoy at the table. Continue the vitamin D. Continue to find ways to cope with your stress.             [place] with your dog Each day find something that brings you joy. Continue to stay active.  Aim for 30 minutes most days.  Aim for 2-3 Carb Choices per meal (30-45 grams) +/- 1 either way  Aim for 0-1 Carbs per snack if hungry  Include protein in moderation with your meals and snacks Consider reading food labels for Total Carbohydrate and Fat Grams of foods

## 2017-10-25 ENCOUNTER — Ambulatory Visit: Payer: Medicare HMO | Admitting: Family Medicine

## 2017-10-25 ENCOUNTER — Other Ambulatory Visit: Payer: Self-pay | Admitting: Family Medicine

## 2017-11-04 ENCOUNTER — Encounter: Payer: Self-pay | Admitting: Family Medicine

## 2017-11-04 ENCOUNTER — Ambulatory Visit: Payer: Medicare HMO | Admitting: Family Medicine

## 2017-11-04 VITALS — BP 110/64 | HR 79 | Temp 99.2°F | Ht 69.0 in | Wt 213.4 lb

## 2017-11-04 DIAGNOSIS — E119 Type 2 diabetes mellitus without complications: Secondary | ICD-10-CM

## 2017-11-04 DIAGNOSIS — E782 Mixed hyperlipidemia: Secondary | ICD-10-CM

## 2017-11-04 DIAGNOSIS — I10 Essential (primary) hypertension: Secondary | ICD-10-CM

## 2017-11-04 DIAGNOSIS — G720 Drug-induced myopathy: Secondary | ICD-10-CM | POA: Insufficient documentation

## 2017-11-04 DIAGNOSIS — T466X5A Adverse effect of antihyperlipidemic and antiarteriosclerotic drugs, initial encounter: Secondary | ICD-10-CM | POA: Insufficient documentation

## 2017-11-04 DIAGNOSIS — Z789 Other specified health status: Secondary | ICD-10-CM

## 2017-11-04 LAB — MICROALBUMIN / CREATININE URINE RATIO
CREATININE, U: 238.2 mg/dL
MICROALB/CREAT RATIO: 0.9 mg/g (ref 0.0–30.0)
Microalb, Ur: 2.1 mg/dL — ABNORMAL HIGH (ref 0.0–1.9)

## 2017-11-04 LAB — LIPID PANEL
CHOL/HDL RATIO: 5
CHOLESTEROL: 253 mg/dL — AB (ref 0–200)
HDL: 49 mg/dL (ref >39.00)
NONHDL: 203.8
Triglycerides: 218 mg/dL — ABNORMAL HIGH (ref 0.0–149.0)
VLDL: 43.6 mg/dL — AB (ref 0.0–40.0)

## 2017-11-04 LAB — HEMOGLOBIN A1C: Hgb A1c MFr Bld: 6.2 % (ref 4.6–6.5)

## 2017-11-04 LAB — LDL CHOLESTEROL, DIRECT: Direct LDL: 178 mg/dL

## 2017-11-04 NOTE — Progress Notes (Signed)
Subjective  CC:  Chief Complaint  Patient presents with  . Diabetes    HPI: [name] is a 61 y.o. female who presents to the office today for follow up of diabetes, hypertension and hyperlipidemia.   Diabetic f/u: she is a new onset diabetes dxd in September. She has lost 20 pounds! Eating clean. Going to Nutrition and DM education. Feels great. On zyprexa.  Her diabetic control is due to be rechecked today. Had eye exam in October, no retinopathy by report.  She denies exertional CP or SOB or symptomatic hypoglycemia. She denies foot sores.    Hypertension f/u: Control is good . Pt reports she is doing well. taking medications as instructed, no medication side effects noted, no TIAs, no chest pain on exertion, no dyspnea on exertion, no swelling of ankles.  She denies adverse effects from his BP medications. Compliance with medication is good.    Hyperlipidemia, statin intolerant: doing well. Hoping diet changes will be helpful. Sees lipid clinic.   I reviewed the patients updated PMH, FH, and SocHx.  Patient Active Problem List   Diagnosis Date Noted  . Statin intolerance 11/04/2017  . Physical exam 08/04/2017  . Family history of early CAD 03/10/2017  . HTN (hypertension) 02/15/2017  . Herniated nucleus pulposus, L5-S1 06/22/2014  . Asthma, moderate persistent 10/09/2013  . Obesity (BMI 30-39.9) 10/09/2013  . Fatigue 12/12/2012  . GERD (gastroesophageal reflux disease) 09/02/2012  . Vitamin D deficiency 09/15/2010  . DEPRESSIVE DISORDER 07/17/2010  . RHINITIS 06/14/2009  . MUSCLE WEAKNESS (GENERALIZED) 01/03/2009  . Hyperlipidemia 10/10/2008  . BIPOLAR AFFECTIVE DISORDER 10/10/2008  . ADD 10/10/2008   Immunization History  Administered Date(s) Administered  . Influenza Split 09/02/2012  . Influenza,inj,Quad PF,6+ Mos 10/09/2013, 08/13/2015, 08/17/2016, 07/28/2017  . Pneumococcal Polysaccharide-23 09/02/2012  . Tdap 07/15/2012   Health Maintenance  Topic Date  Due  . OPHTHALMOLOGY EXAM  08/14/1967  . URINE MICROALBUMIN  08/14/1967  . COLONOSCOPY  12/10/2016  . PNEUMOCOCCAL POLYSACCHARIDE VACCINE (2) 09/02/2017  . HIV Screening  02/07/2018 (Originally 08/13/1971)  . HEMOGLOBIN A1C  02/02/2018  . PAP SMEAR  07/10/2018  . FOOT EXAM  11/04/2018  . MAMMOGRAM  11/06/2018  . TETANUS/TDAP  07/15/2022  . INFLUENZA VACCINE  Completed  . Hepatitis C Screening  Completed   Diabetes and HTN Related Lab Review: Lab Results  Component Value Date   HGBA1C 6.6 (H) 08/05/2017   No results found for: Derl Barrow Lab Results  Component Value Date   CREATININE 0.64 08/04/2017   BUN 18 08/04/2017   NA 135 08/04/2017   K 3.7 08/04/2017   CL 96 08/04/2017   CO2 27 08/04/2017   Lab Results  Component Value Date   CHOL 302 (H) 08/04/2017   CHOL 343 (H) 03/15/2017   CHOL 271 (H) 08/17/2016   Lab Results  Component Value Date   HDL 41.30 08/04/2017   HDL 49.60 03/15/2017   HDL 50.40 08/17/2016   Lab Results  Component Value Date   LDLCALC Comment 06/19/2015   LDLCALC 134 (H) 04/17/2014   LDLCALC 117 (H) 12/06/2013   Lab Results  Component Value Date   TRIG (H) 08/04/2017    983.0 Triglyceride is over 400; calculations on Lipids are invalid.   TRIG (H) 03/15/2017    971.0 Triglyceride is over 400; calculations on Lipids are invalid.   TRIG 359.0 (H) 08/17/2016   Lab Results  Component Value Date   CHOLHDL 7 08/04/2017   CHOLHDL 7  03/15/2017   CHOLHDL 5 08/17/2016   Lab Results  Component Value Date   LDLDIRECT 109.0 08/04/2017   LDLDIRECT 130.0 03/15/2017   LDLDIRECT 164.0 08/17/2016   The 10-year ASCVD risk score Mikey Bussing DC Jr., et al., 2013) is: 10.3%   Values used to calculate the score:     Age: 81 years     Sex: Female     Is Non-Hispanic African American: No     Diabetic: Yes     Tobacco smoker: No     Systolic Blood Pressure: 660 mmHg     Is BP treated: Yes     HDL Cholesterol: 41.3 mg/dL     Total Cholesterol:  302 mg/dL  BP Readings from Last 3 Encounters:  11/04/17 110/64  08/04/17 120/80  07/28/17 116/70   Wt Readings from Last 3 Encounters:  11/04/17 213 lb 6.1 oz (96.8 kg)  09/02/17 226 lb (102.5 kg)  08/04/17 231 lb 8 oz (105 kg)     Allergies: Patient is allergic to nitrofurantoin monohyd macro; statins; sulfa antibiotics; alprazolam; amoxicillin; and prednisone. Family History: Patient family history includes Coronary artery disease in her father; Heart attack (age of onset: 33) in her father; Heart disease in her father; Hyperlipidemia in her brother; Hypertension in her brother; Lung disease in her mother. Social History:  Patient  reports that  has never smoked. she has never used smokeless tobacco. She reports that she does not drink alcohol or use drugs.  Review of Systems: Ophthalmic: negative for eye pain, loss of vision or double vision Cardiovascular: negative for chest pain Respiratory: negative for SOB or persistent cough Gastrointestinal: negative for abdominal pain Genitourinary: negative for dysuria or gross hematuria MSK: negative for foot lesions Neurologic: negative for weakness or gait disturbance Current Meds  Medication Sig  . Cholecalciferol (VITAMIN D) 2000 units tablet Take 2,000 Units by mouth daily.  . clonazePAM (KLONOPIN) 2 MG tablet 1 mg 4 (four) times daily as needed.   Marland Kitchen estrogens, conjugated, (PREMARIN) 0.625 MG tablet Take 1 tablet (0.625 mg total) by mouth daily.  . fenofibrate (TRICOR) 145 MG tablet Take 1 tablet (145 mg total) by mouth daily.  Marland Kitchen gabapentin (NEURONTIN) 600 MG tablet Take 600 mg by mouth 4 (four) times daily.   . hydrochlorothiazide (HYDRODIURIL) 12.5 MG tablet Take 1 tablet (12.5 mg total) by mouth daily.  Marland Kitchen lamoTRIgine (LAMICTAL) 200 MG tablet Take 200 mg by mouth 2 (two) times daily.   . metoprolol succinate (TOPROL-XL) 50 MG 24 hr tablet TAKE 1 TABLET BY MOUTH EVERY DAY *WITH OR IMMEDIATELY FOLLOWING A MEAL*  . OLANZapine  (ZYPREXA) 10 MG tablet Take 10 mg by mouth daily as needed.  Marland Kitchen omega-3 acid ethyl esters (LOVAZA) 1 g capsule Take 1 gram (1 capsule) twice daily for 1 week, then increase to 2 grams (2 capsules) twice daily  . Omega-3 Fatty Acids (FISH OIL) 1000 MG CAPS Take 1 capsule by mouth daily.  . QUEtiapine (SEROQUEL XR) 400 MG 24 hr tablet Take one tablet every evening at bedtime  . QUEtiapine (SEROQUEL) 50 MG tablet TK 1 T PO TID  . QVAR 80 MCG/ACT inhaler INHALE 2 PUFFS INTO THE LUNGS 2 (TWO) TIMES DAILY.  Marland Kitchen venlafaxine (EFFEXOR-XR) 75 MG 24 hr capsule Take 225 mg by mouth daily.     Objective  Vitals: BP 110/64 (BP Location: Left Arm, Patient Position: Sitting, Cuff Size: Large)   Pulse 79   Temp 99.2 F (37.3 C) (Oral)   Ht  5\' 9"  (1.753 m)   Wt 213 lb 6.1 oz (96.8 kg)   LMP 11/09/2000   SpO2 (!) 76%   BMI 31.51 kg/m  General: well appearing, no acute distress  Psych:  Alert and oriented, normal mood and affect HEENT:  Normocephalic, atraumatic, moist mucous membranes, supple neck  Cardiovascular:  Nl S1 and S2, RRR without murmur, gallop or rub. no edema Respiratory:  Good breath sounds bilaterally, CTAB with normal effort, no rales Gastrointestinal: normal BS, soft, nontender Skin:  Warm, no rashes Neurologic:   Mental status is normal. normal gait Foot exam: no erythema, pallor, or cyanosis visible nl proprioception and sensation to monofilament testing bilaterally, +2 distal pulses bilaterally  Assessment  1. New onset type 2 diabetes mellitus ([place])   2. Essential hypertension   3. Mixed hyperlipidemia   4. Statin intolerance      Plan   Diabetes is currently well controlled. Will check today and should be improved with diet and weight loss changes. Praised. Eye exam ok per pt report. Foot exam nl. Had pneumovax. Check urine microalbumin today; start ace if +. Recheck lipids - hopefully better with diet changes.   Hypertension is currently very well controlled. On hctz and bb;  consider changing to ace if DM persists.   lipids: statin intolerant. LDL goal < 100. Sees lipid clinic. On tricor and fish oil. Expect hypertriglyceridemia is improved as well. Pt is fasting.  Diabetic education: ongoing education regarding chronic disease management for diabetes was given today. We continue to reinforce the ABC's of diabetic management: A1c (<7 or 8 dependent upon patient), tight blood pressure control, and cholesterol management with goal LDL < 100 minimally. We discuss diet strategies, exercise recommendations, medication options and possible side effects. At each visit, we review recommended immunizations and preventive care recommendations for diabetics and stress that good diabetic control can prevent other problems. See below for this patient's data. Hypertension education: ongoing education regarding management of these chronic disease states was given. Management strategies discussed on successive visits include dietary and exercise recommendations, goals of achieving and maintaining IBW, and lifestyle modifications aiming for adequate sleep and minimizing stressors.   Follow up: await a1c result: f/u in 3- 6 months depending upon results.   Commons side effects, risks, benefits, and alternatives for medications and treatment plan prescribed today were discussed, and the patient expressed understanding of the given instructions. Patient is instructed to call or message via MyChart if he/she has any questions or concerns regarding our treatment plan. No barriers to understanding were identified. We discussed Red Flag symptoms and signs in detail. Patient expressed understanding regarding what to do in case of urgent or emergency type symptoms.   Medication list was reconciled, printed and provided to the patient in AVS. Patient instructions and summary information was reviewed with the patient as documented in the AVS. This note was prepared with assistance of Dragon voice  recognition software. Occasional wrong-word or sound-a-like substitutions may have occurred due to the inherent limitations of voice recognition software  Orders Placed This Encounter  Procedures  . Hemoglobin A1c  . Lipid panel  . Microalbumin / creatinine urine ratio   No orders of the defined types were placed in this encounter.

## 2017-11-04 NOTE — Patient Instructions (Signed)
Great work with changing your lifestyle and losing weight. This will indeed improve your problems. I will let you know what your sugar test shows; I'm hopeful it has normalized.   Follow up in 3-6 months for recheck. I will let you know which after your lab tests have returned.

## 2017-11-05 ENCOUNTER — Other Ambulatory Visit: Payer: Self-pay | Admitting: Family Medicine

## 2017-11-11 DIAGNOSIS — R69 Illness, unspecified: Secondary | ICD-10-CM | POA: Diagnosis not present

## 2017-11-16 ENCOUNTER — Other Ambulatory Visit: Payer: Self-pay | Admitting: Family Medicine

## 2017-11-25 ENCOUNTER — Other Ambulatory Visit: Payer: Self-pay | Admitting: Obstetrics & Gynecology

## 2017-11-25 ENCOUNTER — Telehealth: Payer: Self-pay | Admitting: Pharmacist

## 2017-11-25 DIAGNOSIS — R69 Illness, unspecified: Secondary | ICD-10-CM | POA: Diagnosis not present

## 2017-11-25 MED ORDER — EVOLOCUMAB 140 MG/ML ~~LOC~~ SOAJ: 140.0000 mg | SUBCUTANEOUS | 11 refills | Status: DC

## 2017-11-25 NOTE — Telephone Encounter (Signed)
Patient has some questions about her Premarin.

## 2017-11-25 NOTE — Telephone Encounter (Signed)
Rx sent to prefer pharmacy and patient notified.  Will call back if unable to afford co-pay

## 2017-11-26 NOTE — Telephone Encounter (Signed)
Spoke with patient. Patient states that her Premarin 0.625 mg tablets are $60 for 10 tablets. Asking if there is an alternative she could try that may reduce cost. Advised will review with Dr.Miller and return call. Patient is agreeable.

## 2017-11-29 MED ORDER — ESTRADIOL 1 MG PO TABS: 1.0000 mg | ORAL_TABLET | Freq: Every day | ORAL | 0 refills | Status: DC

## 2017-11-29 NOTE — Telephone Encounter (Signed)
This was routed back to me so wanted to make sure you saw this.  Ok to change to estradiol 1.0mg  daily.  #90/0RF.  Does need AEX as well.  Thanks.  Sorry if duplicate message.

## 2017-11-29 NOTE — Telephone Encounter (Signed)
Yes, she could use estradiol 1.0mg  daily.  Ok to send into pharmacy of choice for pt.  Thanks.

## 2017-11-29 NOTE — Telephone Encounter (Signed)
Spoke with patient. Patient would like to try Estradiol 1 mg. Upon review of patient's chart to see when next aex was for quantity supply noticed there is no aex schedule and last aex was in 2017 with Dr.Miller. Last MMG was in 2017 as well. Routing to [place] for review before sending in rx.

## 2017-11-29 NOTE — Telephone Encounter (Signed)
Patient is calling for update on medication request.

## 2017-11-29 NOTE — Telephone Encounter (Signed)
Ok to send in 90 day supply but she does need AEX.  Last one was 12/17.  Thanks.

## 2017-11-30 NOTE — Telephone Encounter (Signed)
Spoke with patient. Advised rx for Estradiol 1 mg has been sent to her pharmacy for 3 month supply. Aex scheduled for 12/31/2017 at 10 am with Dr.Miller. Patient is agreeable to date and time. Encounter closed.

## 2017-11-30 NOTE — Addendum Note (Signed)
Addended by: Gwendlyn Deutscher on: 11/30/2017 09:06 AM   Modules accepted: Orders

## 2017-12-01 ENCOUNTER — Telehealth: Payer: Self-pay | Admitting: Interventional Cardiology

## 2017-12-01 NOTE — Telephone Encounter (Signed)
[facility] safetyNet paperwork provided. Patient to complete and return back to clinic ASAP

## 2017-12-01 NOTE — Telephone Encounter (Signed)
New message  Pt c/o medication issue:  1. Name of Medication:   Evolocumab (REPATHA SURECLICK) 979 MG/ML SOAJ   2. How are you currently taking this medication (dosage and times per day)? Inject 140 mg into the skin every 14 (fourteen) days  3. Are you having a reaction (difficulty breathing--STAT)? no  4. What is your medication issue? Patient says that she cant afford the injections and wants to know about an alternative

## 2017-12-02 ENCOUNTER — Ambulatory Visit: Payer: Medicare HMO | Admitting: Dietician

## 2017-12-09 DIAGNOSIS — R69 Illness, unspecified: Secondary | ICD-10-CM | POA: Diagnosis not present

## 2017-12-14 ENCOUNTER — Ambulatory Visit: Payer: Medicare HMO | Admitting: Dietician

## 2017-12-17 ENCOUNTER — Ambulatory Visit: Payer: Medicare HMO

## 2017-12-21 ENCOUNTER — Encounter: Payer: Medicare HMO | Attending: Family Medicine | Admitting: Dietician

## 2017-12-21 DIAGNOSIS — E559 Vitamin D deficiency, unspecified: Secondary | ICD-10-CM

## 2017-12-21 DIAGNOSIS — E119 Type 2 diabetes mellitus without complications: Secondary | ICD-10-CM | POA: Insufficient documentation

## 2017-12-21 DIAGNOSIS — Z713 Dietary counseling and surveillance: Secondary | ICD-10-CM | POA: Diagnosis not present

## 2017-12-21 DIAGNOSIS — E669 Obesity, unspecified: Secondary | ICD-10-CM

## 2017-12-21 NOTE — Patient Instructions (Signed)
Find ways to increase your variety: [email or web address]  Consider 2000 units Vitamin D daily.  Great job on the great changes that you have made! Aim to be mindful. Eat slowly and stop when you are satisfied. Eat at the table. When serving a snack: Put a portion in a bowl and enjoy at the table. Continue the vitamin D. Continue to find ways to cope with your stress. [place] with your dog Each day find something that brings you joy. Continue to stay active. Aim for 30 minutes most days.

## 2017-12-21 NOTE — Progress Notes (Signed)
  Medical Nutrition Therapy:  Appt start time: 1400 end time:  9242.   Assessment:  Primary concerns today: Patient is here alone for follow up for type 2 diabetes, hyperlipidemia, obesity.  She reports some increased depression recently and some isolation.  She is followed by a psychiatrist and therapist for bipolar.  Other history include vitamin D deficiency, HTN, OSA on C-pap, constipation, and GERD.   Labs 11/04/18:  Cholesterol 253 (decreased from 302) Triglycerides 218 decreased from 983), HDL 49 (increased from 41), LDL 178 increased from 109.  Her A1C has decreased to 6.2%  She has lost another 9 lbs since last visit 10/14/17. Weight hx: 231 lbs 1 month ago 207 lbs today 118 lowest adult weight  She continues to avoid anything with sugar, choose mindfully and working on decreasing stress eating.  She states that she struggles if she brings chips into the house.  She states that she is increasing the variety of the food that she prepares.  She has not been exercising recently due to illness but enjoys the 24 hour gym.  Patient lives alone.  She worked for [employer] until they downsized.  She is on disability.  She has a pet sitting business and volunteers as well.  She has enough money for food but is worried about finances in general. She has a brother in town but otherwise decreased support.  Preferred Learning Style:   No preference indicated   Learning Readiness:   Change in progress   MEDICATIONS: see list to include Vitamin D   DIETARY INTAKE:  Usual eating pattern includes 3 meals and 2-3 snacks per day.  24-hr recall:  B ( AM): steel cut oats with oat bran, sugar free maple syrup OR scrambled eggs, [place] bacon, coffee with coffee mate  Snk ( AM): apple or almonds or walnuts  L ( PM): vegetable plate OR salad with vinegrette Snk ( PM): apple D ( PM): brown rice, pinto beans, salsa, chicken OR chicken and vegetables Snk ( PM): Occasional frozen yogurt Beverages: water,  coffee with coffee mate, sugar free lemonade, unsweetened iced tea  Usual physical activity: decreased due to illness  Estimated energy needs: 1500 calories 170 g carbohydrates 112 g protein 42 g fat  Progress Towards Goal(s):  In progress.   Nutritional Diagnosis:  NB-1.1 Food and nutrition-related knowledge deficit As related to balance of carbohydrates, protein, and fat.  As evidenced by patient report, diet hx.    Intervention:  Nutrition counseling/education related to diabetes.  Continued to review healthy eating and provide support. Discussed meal options.  Encouraged adding activity back.  Discussed lipids.    Find ways to increase your variety: [employer]Closeouts.hu [email or web address].org https://craig.com/  Consider 2000 units Vitamin D daily.  Great job on the great changes that you have made! Aim to be mindful. Eat slowly and stop when you are satisfied. Eat at the table. When serving a snack: Put a portion in a bowl and enjoy at the table. Continue the vitamin D. Continue to find ways to cope with your stress. [place] with your dog Each day find something that brings you joy. Continue to stay active. Aim for 30 minutes most days.  Teaching Method Utilized:  Visual Auditory  Barriers to learning/adherence to lifestyle change: bipolar, finances  Demonstrated degree of understanding via:  Teach Back   Monitoring/Evaluation:  Dietary intake, exercise, label reading, and body weight in 2 month(s).

## 2017-12-23 ENCOUNTER — Ambulatory Visit: Payer: Medicare HMO

## 2017-12-23 DIAGNOSIS — R69 Illness, unspecified: Secondary | ICD-10-CM | POA: Diagnosis not present

## 2017-12-24 ENCOUNTER — Other Ambulatory Visit: Payer: Self-pay | Admitting: Family Medicine

## 2017-12-24 DIAGNOSIS — I10 Essential (primary) hypertension: Secondary | ICD-10-CM

## 2017-12-31 ENCOUNTER — Other Ambulatory Visit: Payer: Self-pay

## 2017-12-31 ENCOUNTER — Ambulatory Visit (INDEPENDENT_AMBULATORY_CARE_PROVIDER_SITE_OTHER): Payer: Medicare HMO | Admitting: Obstetrics & Gynecology

## 2017-12-31 ENCOUNTER — Telehealth: Payer: Self-pay | Admitting: General Practice

## 2017-12-31 ENCOUNTER — Ambulatory Visit: Payer: Self-pay | Admitting: *Deleted

## 2017-12-31 ENCOUNTER — Telehealth: Payer: Self-pay | Admitting: Obstetrics & Gynecology

## 2017-12-31 ENCOUNTER — Encounter: Payer: Self-pay | Admitting: Obstetrics & Gynecology

## 2017-12-31 VITALS — BP 98/60 | HR 70 | Resp 14 | Ht 68.25 in | Wt 207.0 lb

## 2017-12-31 DIAGNOSIS — Z01419 Encounter for gynecological examination (general) (routine) without abnormal findings: Secondary | ICD-10-CM | POA: Diagnosis not present

## 2017-12-31 DIAGNOSIS — Z1211 Encounter for screening for malignant neoplasm of colon: Secondary | ICD-10-CM | POA: Diagnosis not present

## 2017-12-31 NOTE — Telephone Encounter (Signed)
-----   Message from Midge Minium, MD sent at 12/31/2017 11:20 AM EST ----- Regarding: FW: mutual patient Please have pt call insurance to see what ICS (inhaled corticosteroid) is preferred since Qvar is too expensive.  Thanks! KT ----- Message ----- From: Megan Salon, MD Sent: 12/31/2017  10:56 AM To: Midge Minium, MD Subject: mutual patient                                 [name], I saw this pt today.  She admitted today that she is not using her Qvar inhaler due to cost.  She had pneumonia in March, 2018.  I was wondering if you 'd have another suggestion for her for her inhaler.  She was embarrassed to tell you that it costs too much.    Thanks for any input.  Edwinna Areola

## 2017-12-31 NOTE — Telephone Encounter (Signed)
   Answer Assessment - Initial Assessment Questions 1. DESCRIPTION: "Describe how you are feeling."      Worried     Weak  And  Tired   Lightheaded   When  Stands  Up   2. SEVERITY: "How bad is it?"  "Can you stand and walk?"   - MILD - Feels weak or tired, but does not interfere with work, school or normal activities   - [place] to stand and walk; weakness interferes with work, school, or normal activities   - SEVERE - Unable to stand or walk     mild 3. ONSET:  "When did the weakness begin?"       X   1   Week    4. CAUSE: "What do you think is causing the weakness?"      Possible  Medication    5. MEDICINES: "Have you recently started a new medicine or had a change in the amount of a medicine?"      None    6. OTHER SYMPTOMS: "Do you have any other symptoms?" (e.g., chest pain, fever, cough, SOB, vomiting, diarrhea, bleeding)        None    7. PREGNANCY: "Is there any chance you are pregnant?" "When was your last menstrual period?"      N/a  Protocols used: WEAKNESS (GENERALIZED) AND FATIGUE-A-AH

## 2017-12-31 NOTE — Telephone Encounter (Signed)
Called pt and advised per PCP that she needs to contact her insurance to verify what inhaler she prefers.   [place] for [facility] to Discuss results / PCP recommendations / Schedule patient.

## 2017-12-31 NOTE — Progress Notes (Signed)
Chief Complaint  Patient presents with  . Hypotension    HPI: [name] 62 y.o. come in for Patient comes in today for [facility] Saturday clinic for  new problem evaluation. Had an  norl cpx at her gyne dr Sabra Heck except bp was 98/60 and was noted to have lost weight since her dx of dm  She is on many meds  ? Monitoring  Was sent to  Sat clinic    Tired for a few  A few weeks .  Som sinus .   No fainting  Some dizzy  At times.  No couble vision .  No med change in last months  No cp sob  Just feels tired . Has made an effort to lose weight and control diabetes.   Pt  Called  Back  Appointment made  For  tommorow  At  [facility]  With  Dr  Regis Bill  Saturday  Clinic    Reason for Disposition . Taking a medicine that could cause weakness (e.g., blood pressure medications, diuretics) . [1] MODERATE weakness (i.e., interferes with work, school, normal activities) AND [2] persists > 3 days  Protocols used: WEAKNESS (GENERALIZED) AND Salena Saner, RN     12/31/17 5:18 PM  Note    Pt  Was   Seen today  By  Her  Gyn  Doctor    And   Was  Told  Her  Blood  Pressure  Was   Low    She  Reports  Some  Slight  Weakness     She   Denies  Any  Chest  Pain  Or  Shortness of breath    She  Is  Speaking  In  Complete  sentances . When I  Told  Patient  I  Was  Going to to  Call  [place]   To  Discuss  The  Patients  Symptoms  Pt  Hung  The  Phone  Up .  Attempted  To call  Pt   Back  Several  Times .        ROS: See pertinent positives and negatives per HPI.  hxof mvp  ges palpitaitons with anxiety   Past Medical History:  Diagnosis Date  . Allergic rhinitis   . Anxiety   . Arthritis    R knee, Left Knee  . Asthma   . Bipolar disorder ([place])   . Cataract   . Complication of anesthesia    had bad anxiety after anesthesia  . Constipation   . Constipation, chronic   . Depression   . Diabetes mellitus without complication ([place])   . Dysrhythmia    Palpations occ,  takes Tenormin  . GERD (gastroesophageal reflux disease)   . Heart murmur   . Hyperlipidemia   . Hypertension   . MVP (mitral valve prolapse)   . Urinary tract infection    frequent, see Urololgist 12/16/2010    Family History  Problem Relation Age of Onset  . Coronary artery disease Father   . Heart attack Father 66  . Heart disease Father   . Lung disease Mother        Mycobacterium avium intracellulare  . Hypertension Brother   . Hyperlipidemia Brother   . Anesthesia problems Neg Hx     Social History   Socioeconomic History  . Marital status: Single    Spouse name: None  . Number of children: 0  .  Years of education: 1  . Highest education level: None  Social Needs  . Financial resource strain: None  . Food insecurity - worry: None  . Food insecurity - inability: None  . Transportation needs - medical: None  . Transportation needs - non-medical: None  Occupational History  . Occupation: retired//disability  Tobacco Use  . Smoking status: Never Smoker  . Smokeless tobacco: Never Used  Substance and Sexual Activity  . Alcohol use: No    Alcohol/week: 0.0 oz    Comment: Occasional glass of wine/"hardly ever"  . Drug use: No  . Sexual activity: No    Birth control/protection: Abstinence  Other Topics Concern  . None  Social History Narrative   Lives alone with 2 cats. Has pet sitting business.   2 cups of coffee a day, Rare soda     Outpatient Medications Prior to Visit  Medication Sig Dispense Refill  . Cholecalciferol (VITAMIN D) 2000 units tablet Take 2,000 Units by mouth daily.    . clonazePAM (KLONOPIN) 2 MG tablet 1 mg 4 (four) times daily as needed.   0  . estradiol (ESTRACE) 1 MG tablet Take 1 tablet (1 mg total) by mouth daily. 90 tablet 0  . Evolocumab (REPATHA SURECLICK) 034 MG/ML SOAJ Inject 140 mg into the skin every 14 (fourteen) days. 2 pen 11  . fenofibrate (TRICOR) 145 MG tablet Take 1 tablet (145 mg total) by mouth daily. 30 tablet 5  .  gabapentin (NEURONTIN) 600 MG tablet Take 600 mg by mouth 4 (four) times daily.     . hydrochlorothiazide (HYDRODIURIL) 12.5 MG tablet TAKE 1 TABLET BY MOUTH EVERY DAY 90 tablet 0  . lamoTRIgine (LAMICTAL) 200 MG tablet Take 200 mg by mouth 2 (two) times daily.     . metoprolol succinate (TOPROL-XL) 50 MG 24 hr tablet TAKE 1 TABLET BY MOUTH EVERY DAY *WITH OR IMMEDIATELY FOLLOWING A MEAL* 30 tablet 6  . OLANZapine (ZYPREXA) 10 MG tablet Take 10 mg by mouth daily as needed.    Marland Kitchen omega-3 acid ethyl esters (LOVAZA) 1 g capsule Take 1 gram (1 capsule) twice daily for 1 week, then increase to 2 grams (2 capsules) twice daily 180 capsule 1  . QUEtiapine (SEROQUEL XR) 400 MG 24 hr tablet Take one tablet every evening at bedtime  0  . QUEtiapine (SEROQUEL) 50 MG tablet TK 1 T PO TID  3  . QVAR 80 MCG/ACT inhaler INHALE 2 PUFFS INTO THE LUNGS 2 (TWO) TIMES DAILY. 8.7 g 0  . venlafaxine (EFFEXOR-XR) 75 MG 24 hr capsule Take 225 mg by mouth daily.      No facility-administered medications prior to visit.      EXAM:  BP 110/72   Pulse 77   Temp 98.8 F (37.1 C) (Oral)   Ht 5' 8.25" (1.734 m)   Wt 209 lb 1.9 oz (94.9 kg)   LMP 11/09/2000   SpO2 98%   BMI 31.56 kg/m   Body mass index is 31.56 kg/m. bp repeat 110/70 right arm sitting  GENERAL: vitals reviewed and listed above, alert, oriented, appears well hydrated and in no acute distress HEENT: atraumatic, conjunctiva  clear, no obvious abnormalities on inspection of external nose and ears  NECK: no obvious masses on inspection palpation  LUNGS: clear to auscultation bilaterally, no wheezes, rales or rhonchi, good air movement CV: HRRR, no clubbing cyanosis or  peripheral edema nl cap refill  Abdomen:  Sof,t normal bowel sounds without hepatosplenomegaly, no guarding rebound  or masses no CVA tenderness MS: moves all extremities without noticeable focal  abnormality PSYCH: pleasant and cooperative, no obvious depression or anxiety Lab  Results  Component Value Date   WBC 3.1 (L) 08/04/2017   HGB 13.1 08/04/2017   HCT 38.9 08/04/2017   PLT 180.0 08/04/2017   GLUCOSE 159 (H) 08/04/2017   CHOL 253 (H) 11/04/2017   TRIG 218.0 (H) 11/04/2017   HDL 49.00 11/04/2017   LDLDIRECT 178.0 11/04/2017   LDLCALC Comment 06/19/2015   ALT 20 08/04/2017   AST 18 08/04/2017   NA 135 08/04/2017   K 3.7 08/04/2017   CL 96 08/04/2017   CREATININE 0.64 08/04/2017   BUN 18 08/04/2017   CO2 27 08/04/2017   TSH 1.65 08/04/2017   INR 1.18 09/02/2012   HGBA1C 6.2 11/04/2017   MICROALBUR 2.1 (H) 11/04/2017   BP Readings from Last 3 Encounters:  01/01/18 110/72  12/31/17 98/60  11/04/17 110/64   Wt Readings from Last 3 Encounters:  01/01/18 209 lb 1.9 oz (94.9 kg)  12/31/17 207 lb (93.9 kg)  11/04/17 213 lb 6.1 oz (96.8 kg)    ASSESSMENT AND PLAN:  Discussed the following assessment and plan:  Low blood pressure reading - exam reassuring   today see dr Sabra Heck note  poss dec need for med with weight loss - Plan: Comprehensive metabolic panel, TSH, CBC with Differential/Platelet  Essential hypertension - Plan: Comprehensive metabolic panel, TSH, CBC with Differential/Platelet  Other fatigue - Plan: Comprehensive metabolic panel, TSH, CBC with Differential/Platelet  Medication management - Plan: Comprehensive metabolic panel, TSH, CBC with Differential/Platelet   has had healthy weight loss and  For now stop the hctz get lab on Monday when lab open at [facility]  And make appt  Next week with pcp   -Patient advised to return or notify health care team  if  new concerns arise.  Patient Instructions   bp is better today but if  Problematic   We can hold the diuretic   The hctz int he interim .   Suggest   Lab  On Monday at [facility] get  Blood pressure monitor .    And bring to the net visiit     How to Take Your Blood Pressure You can take your blood pressure at home with a machine. You may need to check your blood pressure at  home:  To check if you have high blood pressure (hypertension).  To check your blood pressure over time.  To make sure your blood pressure medicine is working.  Supplies needed: You will need a blood pressure machine, or monitor. You can buy one at a drugstore or online. When choosing one:  Choose one with an arm cuff.  Choose one that wraps around your upper arm. Only one finger should fit between your arm and the cuff.  Do not choose one that measures your blood pressure from your wrist or finger.  Your doctor can suggest a monitor. How to prepare Avoid these things for 30 minutes before checking your blood pressure:  Drinking caffeine.  Drinking alcohol.  Eating.  Smoking.  Exercising.  Five minutes before checking your blood pressure:  Pee.  Sit in a dining chair. Avoid sitting in a soft couch or armchair.  Be quiet. Do not talk.  How to take your blood pressure Follow the instructions that came with your machine. If you have a digital blood pressure monitor, these may be the instructions: 1. Sit up straight. 2. Place  your feet on the floor. Do not cross your ankles or legs. 3. Rest your left arm at the level of your heart. You may rest it on a table, desk, or chair. 4. Pull up your shirt sleeve. 5. Wrap the blood pressure cuff around the upper part of your left arm. The cuff should be 1 inch (2.5 cm) above your elbow. It is best to wrap the cuff around bare skin. 6. Fit the cuff snugly around your arm. You should be able to place only one finger between the cuff and your arm. 7. Put the cord inside the groove of your elbow. 8. Press the power button. 9. Sit quietly while the cuff fills with air and loses air. 10. Write down the numbers on the screen. 11. Wait 2-3 minutes and then repeat steps 1-10.  What do the numbers mean? Two numbers make up your blood pressure. The first number is called systolic pressure. The second is called diastolic pressure. An  example of a blood pressure reading is "120 over 80" (or 120/80). If you are an adult and do not have a medical condition, use this guide to find out if your blood pressure is normal: Normal  First number: below 120.  Second number: below 80. Elevated  First number: 120-129.  Second number: below 80. Hypertension stage 1  First number: 130-139.  Second number: 80-89. Hypertension stage 2  First number: 140 or above.  Second number: 84 or above. Your blood pressure is above normal even if only the top or bottom number is above normal. Follow these instructions at home:  Check your blood pressure as often as your doctor tells you to.  Take your monitor to your next doctor's appointment. Your doctor will: ? Make sure you are using it correctly. ? Make sure it is working right.  Make sure you understand what your blood pressure numbers should be.  Tell your doctor if your medicines are causing side effects. Contact a doctor if:  Your blood pressure keeps being high. Get help right away if:  Your first blood pressure number is higher than 180.  Your second blood pressure number is higher than 120. This information is not intended to replace advice given to you by your health care provider. Make sure you discuss any questions you have with your health care provider. Document Released: 10/08/2008 Document Revised: 09/23/2016 Document Reviewed: 04/03/2016 Elsevier Interactive Patient Education  2018 [place]. Shanisha Lech M.D.

## 2017-12-31 NOTE — Telephone Encounter (Signed)
Pt  Was   Seen today  By  Her  Gyn  Doctor    And   Was  Told  Her  Blood  Pressure  Was   Low    She  Reports  Some  Slight  Weakness     She   Denies  Any  Chest  Pain  Or  Shortness of breath    She  Is  Speaking  In  Complete  sentances . When I  Told  Patient  I  Was  Going to to  Call  [place]   To  Discuss  The  Patients  Symptoms  Pt  Hung  The  Phone  Up .  Attempted  To call  Pt   Back  Several  Times .

## 2017-12-31 NOTE — Telephone Encounter (Signed)
Pt  Called  Back  Appointment made  For  tommorow  At  [facility]  With  Dr  Regis Bill  Saturday  Clinic    Reason for Disposition . Taking a medicine that could cause weakness (e.g., blood pressure medications, diuretics) . [1] MODERATE weakness (i.e., interferes with work, school, normal activities) AND [2] persists > 3 days  Protocols used: WEAKNESS (GENERALIZED) AND FATIGUE-A-AH

## 2017-12-31 NOTE — Progress Notes (Signed)
62 y.o. G0P0000 SingleCaucasianF here for annual exam.  Doing well now but had pneumonia last year.  Took "months" to get better.    Is being followed in the diabetes clinic and at the lipid clinic.  Has lost almost 30 pounds.  Last Hba1c was 6.2.  Can't take statins.  BP is 98/60.  Reports there are times she feels lightheaded when she stands up quickly.    Denies vaginal bleeding.    Patient's last menstrual period was 11/09/2000.          Sexually active: No.  The current method of family planning is status post hysterectomy.    Exercising: Yes.    walking, gym Smoker:  no  Health Maintenance: Pap:  07/03/11 Neg  History of abnormal Pap:  no MMG:  11/06/16 BIRADS2:Benign  Colonoscopy:  12/10/06 f/u 10 years. Pt knows she is due.  BMD:   2016   TDaP:  2013 Pneumonia vaccine(s):  10/13 Shingrix:   No Hep C testing: 10/22/16 Neg  Screening Labs: PCP   reports that  has never smoked. she has never used smokeless tobacco. She reports that she does not drink alcohol or use drugs.  Past Medical History:  Diagnosis Date  . Allergic rhinitis   . Anxiety   . Arthritis    R knee, Left Knee  . Asthma   . Bipolar disorder ([place])   . Cataract   . Complication of anesthesia    had bad anxiety after anesthesia  . Constipation   . Constipation, chronic   . Depression   . Diabetes mellitus without complication ([place])   . Dysrhythmia    Palpations occ, takes Tenormin  . GERD (gastroesophageal reflux disease)   . Heart murmur   . Hyperlipidemia   . Hypertension   . MVP (mitral valve prolapse)   . Urinary tract infection    frequent, see Urololgist 12/16/2010    Past Surgical History:  Procedure Laterality Date  . ABDOMINAL HYSTERECTOMY  2002   no oophorectomy  . BACK SURGERY  2001   L5 - S1  . NASAL SINUS SURGERY  01/2010   x2  . TONSILLECTOMY    . TOTAL KNEE ARTHROPLASTY  12/23/2011   Procedure: TOTAL KNEE ARTHROPLASTY;  Surgeon: Ninetta Lights, MD;  Location: [place];  Service:  Orthopedics;  Laterality: Right;  DR MURPHY WANTS 120 MINUTES FOR SURGERY  . TOTAL KNEE ARTHROPLASTY  08/31/2012   left knee  . TOTAL KNEE ARTHROPLASTY  08/31/2012   Procedure: TOTAL KNEE ARTHROPLASTY;  Surgeon: Ninetta Lights, MD;  Location: [place];  Service: Orthopedics;  Laterality: Left;    Current Outpatient Medications  Medication Sig Dispense Refill  . Cholecalciferol (VITAMIN D) 2000 units tablet Take 2,000 Units by mouth daily.    . clonazePAM (KLONOPIN) 2 MG tablet 1 mg 4 (four) times daily as needed.   0  . estradiol (ESTRACE) 1 MG tablet Take 1 tablet (1 mg total) by mouth daily. 90 tablet 0  . fenofibrate (TRICOR) 145 MG tablet Take 1 tablet (145 mg total) by mouth daily. 30 tablet 5  . gabapentin (NEURONTIN) 600 MG tablet Take 600 mg by mouth 4 (four) times daily.     . hydrochlorothiazide (HYDRODIURIL) 12.5 MG tablet TAKE 1 TABLET BY MOUTH EVERY DAY 90 tablet 0  . lamoTRIgine (LAMICTAL) 200 MG tablet Take 200 mg by mouth 2 (two) times daily.     . metoprolol succinate (TOPROL-XL) 50 MG 24 hr tablet TAKE 1  TABLET BY MOUTH EVERY DAY *WITH OR IMMEDIATELY FOLLOWING A MEAL* 30 tablet 6  . OLANZapine (ZYPREXA) 10 MG tablet Take 10 mg by mouth daily as needed.    Marland Kitchen omega-3 acid ethyl esters (LOVAZA) 1 g capsule Take 1 gram (1 capsule) twice daily for 1 week, then increase to 2 grams (2 capsules) twice daily 180 capsule 1  . QUEtiapine (SEROQUEL XR) 400 MG 24 hr tablet Take one tablet every evening at bedtime  0  . QUEtiapine (SEROQUEL) 50 MG tablet TK 1 T PO TID  3  . QVAR 80 MCG/ACT inhaler INHALE 2 PUFFS INTO THE LUNGS 2 (TWO) TIMES DAILY. 8.7 g 0  . venlafaxine (EFFEXOR-XR) 75 MG 24 hr capsule Take 225 mg by mouth daily.     . Evolocumab (REPATHA SURECLICK) 381 MG/ML SOAJ Inject 140 mg into the skin every 14 (fourteen) days. (Patient not taking: Reported on 12/31/2017) 2 pen 11   No current facility-administered medications for this visit.     Family History  Problem Relation  Age of Onset  . Coronary artery disease Father   . Heart attack Father 58  . Heart disease Father   . Lung disease Mother        Mycobacterium avium intracellulare  . Hypertension Brother   . Hyperlipidemia Brother   . Anesthesia problems Neg Hx     ROS:  Pertinent items are noted in HPI.  Otherwise, a comprehensive ROS was negative.  Exam:   BP 98/60 (BP Location: Right Arm, Patient Position: Sitting, Cuff Size: Large)   Pulse 70   Resp 14   Ht 5' 8.25" (1.734 m)   Wt 207 lb (93.9 kg)   LMP 11/09/2000   BMI 31.24 kg/m    Height: 5' 8.25" (173.4 cm)  Ht Readings from Last 3 Encounters:  12/31/17 5' 8.25" (1.734 m)  11/04/17 5\' 9"  (1.753 m)  09/02/17 5\' 9"  (1.753 m)    General appearance: alert, cooperative and appears stated age Head: Normocephalic, without obvious abnormality, atraumatic Neck: no adenopathy, supple, symmetrical, trachea midline and thyroid normal to inspection and palpation Lungs: clear to auscultation bilaterally Breasts: normal appearance, no masses or tenderness Heart: regular rate and rhythm Abdomen: soft, non-tender; bowel sounds normal; no masses,  no organomegaly Extremities: extremities normal, atraumatic, no cyanosis or edema Skin: Skin color, texture, turgor normal. No rashes or lesions Lymph nodes: Cervical, supraclavicular, and axillary nodes normal. No abnormal inguinal nodes palpated Neurologic: Grossly normal   Pelvic: External genitalia:  no lesions              Urethra:  normal appearing urethra with no masses, tenderness or lesions              Bartholins and Skenes: normal                 Vagina: normal appearing vagina with normal color and discharge, no lesions              Cervix: absent              Pap taken: No. Bimanual Exam:  Uterus:  uterus absent              Adnexa: no mass, fullness, tenderness               Rectovaginal: Confirms               Anus:  normal sphincter tone, no lesions  Chaperone was present for  exam.  A:  Well Woman with normal exam PMP, on HRT H/O TAH due to fibroids Hypertension hx but low BP today H/o bipolar d/o, under management Elevated lipids Low Vit D  P:   Mammogram guidelines reviewed.  Aware it is due.  Will make appt for pt today. Referral for colonoscopy made today Will call PCPs office for pt regarding three issues: 1) low BP today, 2) whether they have the Shingrix vaccination  3) Alternate inhaler options due to cost pap smear not indicated Estrace 1mg  daily.  Pt is going to start cutting this in 1/2 to lower dosage and possibly try to wean off Return annually or prn

## 2018-01-01 ENCOUNTER — Encounter: Payer: Self-pay | Admitting: Internal Medicine

## 2018-01-01 ENCOUNTER — Ambulatory Visit: Payer: Medicare HMO | Admitting: Internal Medicine

## 2018-01-01 VITALS — BP 110/72 | HR 77 | Temp 98.8°F | Ht 68.25 in | Wt 209.1 lb

## 2018-01-01 DIAGNOSIS — Z79899 Other long term (current) drug therapy: Secondary | ICD-10-CM | POA: Diagnosis not present

## 2018-01-01 DIAGNOSIS — R031 Nonspecific low blood-pressure reading: Secondary | ICD-10-CM | POA: Diagnosis not present

## 2018-01-01 DIAGNOSIS — R5383 Other fatigue: Secondary | ICD-10-CM

## 2018-01-01 DIAGNOSIS — I10 Essential (primary) hypertension: Secondary | ICD-10-CM

## 2018-01-01 NOTE — Patient Instructions (Addendum)
bp is better today but if  Problematic   We can hold the diuretic   The hctz int he interim .   Suggest   Lab  On Monday at [facility] get  Blood pressure monitor .    And bring to the net visiit     How to Take Your Blood Pressure You can take your blood pressure at home with a machine. You may need to check your blood pressure at home:  To check if you have high blood pressure (hypertension).  To check your blood pressure over time.  To make sure your blood pressure medicine is working.  Supplies needed: You will need a blood pressure machine, or monitor. You can buy one at a drugstore or online. When choosing one:  Choose one with an arm cuff.  Choose one that wraps around your upper arm. Only one finger should fit between your arm and the cuff.  Do not choose one that measures your blood pressure from your wrist or finger.  Your doctor can suggest a monitor. How to prepare Avoid these things for 30 minutes before checking your blood pressure:  Drinking caffeine.  Drinking alcohol.  Eating.  Smoking.  Exercising.  Five minutes before checking your blood pressure:  Pee.  Sit in a dining chair. Avoid sitting in a soft couch or armchair.  Be quiet. Do not talk.  How to take your blood pressure Follow the instructions that came with your machine. If you have a digital blood pressure monitor, these may be the instructions: 1. Sit up straight. 2. Place your feet on the floor. Do not cross your ankles or legs. 3. Rest your left arm at the level of your heart. You may rest it on a table, desk, or chair. 4. Pull up your shirt sleeve. 5. Wrap the blood pressure cuff around the upper part of your left arm. The cuff should be 1 inch (2.5 cm) above your elbow. It is best to wrap the cuff around bare skin. 6. Fit the cuff snugly around your arm. You should be able to place only one finger between the cuff and your arm. 7. Put the cord inside the groove of your elbow. 8. Press  the power button. 9. Sit quietly while the cuff fills with air and loses air. 10. Write down the numbers on the screen. 11. Wait 2-3 minutes and then repeat steps 1-10.  What do the numbers mean? Two numbers make up your blood pressure. The first number is called systolic pressure. The second is called diastolic pressure. An example of a blood pressure reading is "120 over 80" (or 120/80). If you are an adult and do not have a medical condition, use this guide to find out if your blood pressure is normal: Normal  First number: below 120.  Second number: below 80. Elevated  First number: 120-129.  Second number: below 80. Hypertension stage 1  First number: 130-139.  Second number: 80-89. Hypertension stage 2  First number: 140 or above.  Second number: 49 or above. Your blood pressure is above normal even if only the top or bottom number is above normal. Follow these instructions at home:  Check your blood pressure as often as your doctor tells you to.  Take your monitor to your next doctor's appointment. Your doctor will: ? Make sure you are using it correctly. ? Make sure it is working right.  Make sure you understand what your blood pressure numbers should be.  Tell your  doctor if your medicines are causing side effects. Contact a doctor if:  Your blood pressure keeps being high. Get help right away if:  Your first blood pressure number is higher than 180.  Your second blood pressure number is higher than 120. This information is not intended to replace advice given to you by your health care provider. Make sure you discuss any questions you have with your health care provider. Document Released: 10/08/2008 Document Revised: 09/23/2016 Document Reviewed: 04/03/2016 Elsevier Interactive Patient Education  [employer].

## 2018-01-03 ENCOUNTER — Other Ambulatory Visit (INDEPENDENT_AMBULATORY_CARE_PROVIDER_SITE_OTHER): Payer: Medicare HMO

## 2018-01-03 DIAGNOSIS — R5383 Other fatigue: Secondary | ICD-10-CM | POA: Diagnosis not present

## 2018-01-03 DIAGNOSIS — R031 Nonspecific low blood-pressure reading: Secondary | ICD-10-CM | POA: Diagnosis not present

## 2018-01-03 DIAGNOSIS — I10 Essential (primary) hypertension: Secondary | ICD-10-CM | POA: Diagnosis not present

## 2018-01-03 DIAGNOSIS — Z79899 Other long term (current) drug therapy: Secondary | ICD-10-CM

## 2018-01-03 LAB — COMPREHENSIVE METABOLIC PANEL
ALBUMIN: 4.6 g/dL (ref 3.5–5.2)
ALT: 17 U/L (ref 0–35)
AST: 11 U/L (ref 0–37)
Alkaline Phosphatase: 20 U/L — ABNORMAL LOW (ref 39–117)
BUN: 17 mg/dL (ref 6–23)
CO2: 28 mEq/L (ref 19–32)
Calcium: 9.9 mg/dL (ref 8.4–10.5)
Chloride: 104 mEq/L (ref 96–112)
Creatinine, Ser: 0.68 mg/dL (ref 0.40–1.20)
GFR: 93.68 mL/min (ref >60.00)
GLUCOSE: 97 mg/dL (ref 70–99)
POTASSIUM: 4.2 meq/L (ref 3.5–5.1)
SODIUM: 141 meq/L (ref 135–145)
Total Bilirubin: 0.6 mg/dL (ref 0.2–1.2)
Total Protein: 7.2 g/dL (ref 6.0–8.3)

## 2018-01-04 ENCOUNTER — Encounter: Payer: Self-pay | Admitting: Family Medicine

## 2018-01-04 ENCOUNTER — Ambulatory Visit (INDEPENDENT_AMBULATORY_CARE_PROVIDER_SITE_OTHER): Payer: Medicare HMO | Admitting: Family Medicine

## 2018-01-04 ENCOUNTER — Other Ambulatory Visit: Payer: Self-pay

## 2018-01-04 VITALS — BP 112/76 | HR 67 | Temp 97.9°F | Resp 14 | Ht 68.25 in | Wt 207.6 lb

## 2018-01-04 DIAGNOSIS — I10 Essential (primary) hypertension: Secondary | ICD-10-CM | POA: Diagnosis not present

## 2018-01-04 DIAGNOSIS — E119 Type 2 diabetes mellitus without complications: Secondary | ICD-10-CM | POA: Diagnosis not present

## 2018-01-04 DIAGNOSIS — J0100 Acute maxillary sinusitis, unspecified: Secondary | ICD-10-CM

## 2018-01-04 MED ORDER — DOXYCYCLINE HYCLATE 100 MG PO TABS: 100.0000 mg | ORAL_TABLET | Freq: Two times a day (BID) | ORAL | 0 refills | Status: AC

## 2018-01-04 NOTE — Patient Instructions (Signed)
Please return in 2-4 weeks for f/u [name] to recheck your blood pressure and diabetes.  Check your blood pressure outside of the office and log the numbers for Dr. Virgil Benedict review at your follow up visit.   Come sooner IF your blood pressure is consistently <100/60.   If you have any questions or concerns, please don't hesitate to send me a message via MyChart or call the office at [phone]. Thank you for visiting with [place] today! It's our pleasure caring for you.

## 2018-01-04 NOTE — Progress Notes (Signed)
Subjective  CC:  Chief Complaint  Patient presents with  . Facial Pain    Dizziness and nausea, drainage, sinus pressure, does not feel like herself, feels weak    HPI: [name] is a 62 y.o. female who presents to the office today to address the problems listed above in the chief complaint.  Patient is a 62 year old female with bipolar disorder that is well controlled and diet-controlled diabetes with hypertension.  Recently she was at her gynecologist office and her blood pressure was noted to be 98/60.  This caused her provider to be concerned.  The next day she was seen at the work in clinic for same.  She denies symptoms of lightheadedness or chest pressure.  Her blood pressure medicine was decreased, specifically her hydrochlorothiazide.  Since, she continues to deny symptoms of orthostatic hypotension, however she feels malaise and reports she has been sick for the last 1-2 weeks with facial pressure postnasal drainage and sinus pressure/pain.  No significant fevers.  She has had a sinus infection in the past and feels the same.  BP Readings from Last 3 Encounters:  01/04/18 112/76  01/01/18 110/72  12/31/17 98/60   Wt Readings from Last 3 Encounters:  01/04/18 207 lb 9.6 oz (94.2 kg)  01/01/18 209 lb 1.9 oz (94.9 kg)  12/31/17 207 lb (93.9 kg)     I reviewed the patients updated PMH, FH, and SocHx.    Patient Active Problem List   Diagnosis Date Noted  . Statin intolerance 11/04/2017  . Physical exam 08/04/2017  . Family history of early CAD 03/10/2017  . HTN (hypertension) 02/15/2017  . Herniated nucleus pulposus, L5-S1 06/22/2014  . Asthma, moderate persistent 10/09/2013  . Obesity (BMI 30-39.9) 10/09/2013  . Fatigue 12/12/2012  . GERD (gastroesophageal reflux disease) 09/02/2012  . Vitamin D deficiency 09/15/2010  . DEPRESSIVE DISORDER 07/17/2010  . RHINITIS 06/14/2009  . MUSCLE WEAKNESS (GENERALIZED) 01/03/2009  . Hyperlipidemia 10/10/2008  . BIPOLAR  AFFECTIVE DISORDER 10/10/2008  . ADD 10/10/2008   Current Meds  Medication Sig  . Cholecalciferol (VITAMIN D) 2000 units tablet Take 2,000 Units by mouth daily.  . clonazePAM (KLONOPIN) 2 MG tablet 1 mg 4 (four) times daily as needed.   Marland Kitchen estradiol (ESTRACE) 1 MG tablet Take 1 tablet (1 mg total) by mouth daily.  . Evolocumab (REPATHA SURECLICK) 174 MG/ML SOAJ Inject 140 mg into the skin every 14 (fourteen) days.  . fenofibrate (TRICOR) 145 MG tablet Take 1 tablet (145 mg total) by mouth daily.  Marland Kitchen gabapentin (NEURONTIN) 600 MG tablet Take 600 mg by mouth 4 (four) times daily.   Marland Kitchen lamoTRIgine (LAMICTAL) 200 MG tablet Take 200 mg by mouth 2 (two) times daily.   . metoprolol succinate (TOPROL-XL) 50 MG 24 hr tablet TAKE 1 TABLET BY MOUTH EVERY DAY *WITH OR IMMEDIATELY FOLLOWING A MEAL*  . OLANZapine (ZYPREXA) 10 MG tablet Take 10 mg by mouth daily as needed.  Marland Kitchen omega-3 acid ethyl esters (LOVAZA) 1 g capsule Take 1 gram (1 capsule) twice daily for 1 week, then increase to 2 grams (2 capsules) twice daily  . QUEtiapine (SEROQUEL XR) 400 MG 24 hr tablet Take one tablet every evening at bedtime  . QUEtiapine (SEROQUEL) 50 MG tablet TK 1 T PO TID  . QVAR 80 MCG/ACT inhaler INHALE 2 PUFFS INTO THE LUNGS 2 (TWO) TIMES DAILY.  Marland Kitchen venlafaxine (EFFEXOR-XR) 75 MG 24 hr capsule Take 225 mg by mouth daily.     Allergies: Patient  is allergic to nitrofurantoin monohyd macro; statins; sulfa antibiotics; alprazolam; amoxicillin; and prednisone. Family History: Patient family history includes Coronary artery disease in her father; Heart attack (age of onset: 3) in her father; Heart disease in her father; Hyperlipidemia in her brother; Hypertension in her brother; Lung disease in her mother. Social History:  Patient  reports that  has never smoked. she has never used smokeless tobacco. She reports that she does not drink alcohol or use drugs.  Review of Systems: Constitutional: Negative for fever malaise or  anorexia Cardiovascular: negative for chest pain Respiratory: negative for SOB or persistent cough Gastrointestinal: negative for abdominal pain  Objective  Vitals: BP 112/76   Pulse 67   Temp 97.9 F (36.6 C) (Oral)   Resp 14   Ht 5' 8.25" (1.734 m)   Wt 207 lb 9.6 oz (94.2 kg)   LMP 11/09/2000   SpO2 94%   BMI 31.33 kg/m  General: no acute distress , A&Ox3 HEENT: PEERL, conjunctiva normal, Oropharynx moist,neck is supple, nasal mucosa is erythematous and sinus pressure pain is present, normal TMs bilaterally Cardiovascular:  RRR without murmur or gallop.  Respiratory:  Good breath sounds bilaterally, CTAB with normal respiratory effort Skin:  Warm, no rashes  Assessment  1. Acute non-recurrent maxillary sinusitis   2. Essential hypertension   3. New onset type 2 diabetes mellitus (HCC)      Plan   Sinusitis, bacterial: This is the most likely cause of her malaise and feeling poorly.  Start antibiotics and treat supportively.  Follow-up if unimproved  Hypertension is well controlled on current dose of medication.  Likely low blood pressure due to 30 pound weight loss over the last 6 months or so.  Continue medications at current dose.  Follow-up with primary care doctor as scheduled  Diabetes remains well controlled, diet controlled.  Has follow-up with PCP.  Doubt hypoglycemia related to her symptoms.  Follow up: 1-4 weeks with PCP for blood pressure and diabetes follow-up, sooner if sinus symptoms do not improve   Commons side effects, risks, benefits, and alternatives for medications and treatment plan prescribed today were discussed, and the patient expressed understanding of the given instructions. Patient is instructed to call or message via MyChart if he/she has any questions or concerns regarding our treatment plan. No barriers to understanding were identified. We discussed Red Flag symptoms and signs in detail. Patient expressed understanding regarding what to do in  case of urgent or emergency type symptoms.   Medication list was reconciled, printed and provided to the patient in AVS. Patient instructions and summary information was reviewed with the patient as documented in the AVS. This note was prepared with assistance of Dragon voice recognition software. Occasional wrong-word or sound-a-like substitutions may have occurred due to the inherent limitations of voice recognition software  No orders of the defined types were placed in this encounter.  Meds ordered this encounter  Medications  . doxycycline (VIBRA-TABS) 100 MG tablet    Sig: Take 1 tablet (100 mg total) by mouth 2 (two) times daily for 7 days.    Dispense:  14 tablet    Refill:  0

## 2018-01-05 ENCOUNTER — Encounter: Payer: Self-pay | Admitting: Family Medicine

## 2018-01-05 DIAGNOSIS — Z1231 Encounter for screening mammogram for malignant neoplasm of breast: Secondary | ICD-10-CM | POA: Diagnosis not present

## 2018-01-07 ENCOUNTER — Other Ambulatory Visit: Payer: Self-pay | Admitting: Obstetrics & Gynecology

## 2018-01-07 ENCOUNTER — Ambulatory Visit: Payer: Medicare HMO | Admitting: Family Medicine

## 2018-01-07 DIAGNOSIS — Z1211 Encounter for screening for malignant neoplasm of colon: Secondary | ICD-10-CM

## 2018-01-10 DIAGNOSIS — R69 Illness, unspecified: Secondary | ICD-10-CM | POA: Diagnosis not present

## 2018-01-11 ENCOUNTER — Encounter: Payer: Self-pay | Admitting: Obstetrics & Gynecology

## 2018-01-13 DIAGNOSIS — R69 Illness, unspecified: Secondary | ICD-10-CM | POA: Diagnosis not present

## 2018-01-15 ENCOUNTER — Other Ambulatory Visit: Payer: Self-pay | Admitting: Interventional Cardiology

## 2018-01-19 ENCOUNTER — Telehealth: Payer: Self-pay | Admitting: Pharmacist Clinician (PhC)/ Clinical Pharmacy Specialist

## 2018-01-19 MED ORDER — OMEGA-3-ACID ETHYL ESTERS 1 G PO CAPS: 2.0000 g | ORAL_CAPSULE | Freq: Two times a day (BID) | ORAL | 5 refills | Status: DC

## 2018-01-19 NOTE — Telephone Encounter (Signed)
Patient called, concerned because of cost of generic lovaza was cost prohibitive even with her insurance.  Wanted to know if OTC would be as beneficial, but concerned, as the Rx was working so well (trigs decreased from >900 to 200s).    Gave her information on [facility].  Price quote online is about $50-75.  Will send rx to [facility] on [address] and patient knows to call if she will need to go elsewhere

## 2018-01-21 ENCOUNTER — Other Ambulatory Visit: Payer: Self-pay | Admitting: Pharmacist

## 2018-01-21 MED ORDER — OMEGA-3-ACID ETHYL ESTERS 1 G PO CAPS: 2.0000 g | ORAL_CAPSULE | Freq: Two times a day (BID) | ORAL | 5 refills | Status: DC

## 2018-01-24 DIAGNOSIS — R69 Illness, unspecified: Secondary | ICD-10-CM | POA: Diagnosis not present

## 2018-01-25 ENCOUNTER — Telehealth: Payer: Self-pay | Admitting: Pharmacist

## 2018-01-25 NOTE — Telephone Encounter (Signed)
[employer] safetyNet approved. Patient to call and arrange 1st delivery.

## 2018-01-31 ENCOUNTER — Ambulatory Visit: Payer: Medicare HMO | Admitting: Family Medicine

## 2018-02-03 ENCOUNTER — Telehealth: Payer: Self-pay | Admitting: Obstetrics & Gynecology

## 2018-02-03 NOTE — Telephone Encounter (Signed)
Call placed to patient [facility] for referral.

## 2018-02-08 DIAGNOSIS — R69 Illness, unspecified: Secondary | ICD-10-CM | POA: Diagnosis not present

## 2018-02-09 ENCOUNTER — Other Ambulatory Visit: Payer: Self-pay

## 2018-02-09 ENCOUNTER — Encounter: Payer: Self-pay | Admitting: Family Medicine

## 2018-02-09 ENCOUNTER — Ambulatory Visit (INDEPENDENT_AMBULATORY_CARE_PROVIDER_SITE_OTHER): Payer: Medicare HMO | Admitting: Family Medicine

## 2018-02-09 ENCOUNTER — Encounter: Payer: Self-pay | Admitting: General Practice

## 2018-02-09 VITALS — BP 113/80 | HR 73 | Temp 98.1°F | Resp 16 | Ht 68.0 in | Wt 202.2 lb

## 2018-02-09 DIAGNOSIS — E119 Type 2 diabetes mellitus without complications: Secondary | ICD-10-CM | POA: Diagnosis not present

## 2018-02-09 DIAGNOSIS — E782 Mixed hyperlipidemia: Secondary | ICD-10-CM

## 2018-02-09 DIAGNOSIS — F310 Bipolar disorder, current episode hypomanic: Secondary | ICD-10-CM

## 2018-02-09 DIAGNOSIS — R69 Illness, unspecified: Secondary | ICD-10-CM | POA: Diagnosis not present

## 2018-02-09 LAB — BASIC METABOLIC PANEL
BUN: 21 mg/dL (ref 6–23)
CHLORIDE: 104 meq/L (ref 96–112)
CO2: 26 mEq/L (ref 19–32)
CREATININE: 0.64 mg/dL (ref 0.40–1.20)
Calcium: 9.7 mg/dL (ref 8.4–10.5)
GFR: 100.44 mL/min (ref >60.00)
GLUCOSE: 101 mg/dL — AB (ref 70–99)
Potassium: 4.2 mEq/L (ref 3.5–5.1)
Sodium: 139 mEq/L (ref 135–145)

## 2018-02-09 LAB — HEMOGLOBIN A1C: HEMOGLOBIN A1C: 5.6 % (ref 4.6–6.5)

## 2018-02-09 NOTE — Progress Notes (Signed)
   Subjective:    Patient ID: [name], female    DOB: February 16, 1956, 62 y.o.   MRN: [id number]  HPI DM- ongoing issue for pt.  She has lost 36 lbs total since dx in September.  UTD on foot exam, microalbumin.  Due for eye exam.  Pt is exercising regularly- [place] 3[place]4x[place] for an hour.  Pt has given up sweets.  No CP, SOB, HAs, numbness/tingling of hands/feet.  No abd pain, N/V.  Hyperlipidemia- taking Repatha via lipid clinic.  Just started last week.  Getting this free through lipid clinic.  Anxiety- pt is very stressed over her finances.  Seeing psychiatry regularly.   Review of Systems For ROS see HPI     Objective:   Physical Exam  Constitutional: She is oriented to person, place, and time. She appears well-developed and well-nourished. No distress.  HENT:  Head: Normocephalic and atraumatic.  Eyes: Pupils are equal, round, and reactive to light. Conjunctivae and EOM are normal.  Neck: Normal range of motion. Neck supple. No thyromegaly present.  Cardiovascular: Normal rate, regular rhythm, normal heart sounds and intact distal pulses.  No murmur heard. Pulmonary/Chest: Effort normal and breath sounds normal. No respiratory distress.  Abdominal: Soft. She exhibits no distension. There is no tenderness.  Musculoskeletal: She exhibits no edema.  Lymphadenopathy:    She has no cervical adenopathy.  Neurological: She is alert and oriented to person, place, and time.  Skin: Skin is warm and dry.  Psychiatric: She has a normal mood and affect. Her behavior is normal.  Vitals reviewed.         Assessment & Plan:

## 2018-02-09 NOTE — Assessment & Plan Note (Signed)
Chronic problem.  Now on Repatha per lipid clinic.  Reviewed dosing schedule w/ her.  Will follow.

## 2018-02-09 NOTE — Assessment & Plan Note (Signed)
Pt continues to have high levels of anxiety despite tx by psych.  Will follow along and assist as able.

## 2018-02-09 NOTE — Assessment & Plan Note (Signed)
Ongoing issue for pt.  She has made overwhelming life changes since her dx in September.  Applauded her efforts.  UTD on foot exam, microalbumin.  Due for eye exam.  Pt plans to schedule.  Check labs.  Start meds prn.

## 2018-02-09 NOTE — Patient Instructions (Signed)
Follow up in 3-4 months to recheck sugar and cholesterol We'll notify you of your lab results and make any changes if needed Keep up the good work on healthy diet and regular exercise- you look great!!! Schedule your eye exam! Call with any questions or concerns [place]!!!

## 2018-02-21 ENCOUNTER — Ambulatory Visit: Payer: Medicare HMO | Admitting: Dietician

## 2018-02-22 DIAGNOSIS — R69 Illness, unspecified: Secondary | ICD-10-CM | POA: Diagnosis not present

## 2018-03-07 DIAGNOSIS — R69 Illness, unspecified: Secondary | ICD-10-CM | POA: Diagnosis not present

## 2018-03-08 ENCOUNTER — Telehealth: Payer: Self-pay | Admitting: Obstetrics & Gynecology

## 2018-03-08 NOTE — Telephone Encounter (Signed)
Call placed in reference to a referral. °

## 2018-03-09 ENCOUNTER — Encounter: Payer: Self-pay | Admitting: Obstetrics & Gynecology

## 2018-03-11 ENCOUNTER — Other Ambulatory Visit: Payer: Self-pay | Admitting: Pharmacist Clinician (PhC)/ Clinical Pharmacy Specialist

## 2018-03-11 MED ORDER — OMEGA-3-ACID ETHYL ESTERS 1 G PO CAPS: 2.0000 g | ORAL_CAPSULE | Freq: Two times a day (BID) | ORAL | 5 refills | Status: DC

## 2018-03-14 DIAGNOSIS — G4733 Obstructive sleep apnea (adult) (pediatric): Secondary | ICD-10-CM | POA: Diagnosis not present

## 2018-03-18 ENCOUNTER — Other Ambulatory Visit: Payer: Self-pay | Admitting: Obstetrics & Gynecology

## 2018-03-18 NOTE — Telephone Encounter (Signed)
Spoke with patient- she states that she could not tolerate taking just the half so she is back to taking a whole tablet.

## 2018-03-18 NOTE — Telephone Encounter (Signed)
She was going to cut this in 1/2 and start weaning off of this.  Can you get an update about her progress so I know what dosage she needs?  Thanks.

## 2018-03-18 NOTE — Telephone Encounter (Signed)
Patient calling for refill on estradiol. Pharmacy is [facility] on [address] at [phone].

## 2018-03-18 NOTE — Telephone Encounter (Signed)
Medication refill request: Estradiol  Last AEX:  12-31-17  Next AEX: 03-17-19  Last MMG (if hormonal medication request): 01-05-18 WNL  Refill authorized: please advise

## 2018-03-19 ENCOUNTER — Other Ambulatory Visit: Payer: Self-pay | Admitting: Family Medicine

## 2018-03-19 DIAGNOSIS — I10 Essential (primary) hypertension: Secondary | ICD-10-CM

## 2018-04-07 ENCOUNTER — Other Ambulatory Visit: Payer: Self-pay | Admitting: Family Medicine

## 2018-04-26 DIAGNOSIS — R69 Illness, unspecified: Secondary | ICD-10-CM | POA: Diagnosis not present

## 2018-04-29 ENCOUNTER — Ambulatory Visit: Payer: Medicare HMO | Admitting: Family Medicine

## 2018-05-04 DIAGNOSIS — R69 Illness, unspecified: Secondary | ICD-10-CM | POA: Diagnosis not present

## 2018-05-05 DIAGNOSIS — R69 Illness, unspecified: Secondary | ICD-10-CM | POA: Diagnosis not present

## 2018-05-09 DIAGNOSIS — M456 Ankylosing spondylitis lumbar region: Secondary | ICD-10-CM | POA: Diagnosis not present

## 2018-05-09 DIAGNOSIS — M47896 Other spondylosis, lumbar region: Secondary | ICD-10-CM | POA: Diagnosis not present

## 2018-05-09 DIAGNOSIS — M5136 Other intervertebral disc degeneration, lumbar region: Secondary | ICD-10-CM | POA: Diagnosis not present

## 2018-05-09 DIAGNOSIS — M545 Low back pain: Secondary | ICD-10-CM | POA: Diagnosis not present

## 2018-05-09 DIAGNOSIS — M5416 Radiculopathy, lumbar region: Secondary | ICD-10-CM | POA: Diagnosis not present

## 2018-05-11 ENCOUNTER — Other Ambulatory Visit: Payer: Self-pay | Admitting: Orthopaedic Surgery

## 2018-05-11 DIAGNOSIS — M5416 Radiculopathy, lumbar region: Secondary | ICD-10-CM

## 2018-05-19 DIAGNOSIS — R69 Illness, unspecified: Secondary | ICD-10-CM | POA: Diagnosis not present

## 2018-05-20 ENCOUNTER — Other Ambulatory Visit: Payer: Self-pay

## 2018-05-20 ENCOUNTER — Encounter: Payer: Self-pay | Admitting: Family Medicine

## 2018-05-20 ENCOUNTER — Ambulatory Visit: Payer: Medicare HMO | Admitting: Family Medicine

## 2018-05-20 VITALS — BP 114/80 | HR 72 | Temp 98.1°F | Resp 16 | Ht 68.0 in | Wt 189.2 lb

## 2018-05-20 DIAGNOSIS — E119 Type 2 diabetes mellitus without complications: Secondary | ICD-10-CM

## 2018-05-20 DIAGNOSIS — E782 Mixed hyperlipidemia: Secondary | ICD-10-CM

## 2018-05-20 DIAGNOSIS — I10 Essential (primary) hypertension: Secondary | ICD-10-CM

## 2018-05-20 LAB — BASIC METABOLIC PANEL
BUN: 24 mg/dL — AB (ref 6–23)
CO2: 27 meq/L (ref 19–32)
Calcium: 9.9 mg/dL (ref 8.4–10.5)
Chloride: 105 mEq/L (ref 96–112)
Creatinine, Ser: 0.69 mg/dL (ref 0.40–1.20)
GFR: 92 mL/min (ref >60.00)
GLUCOSE: 102 mg/dL — AB (ref 70–99)
Potassium: 4.2 mEq/L (ref 3.5–5.1)
Sodium: 142 mEq/L (ref 135–145)

## 2018-05-20 LAB — LIPID PANEL
CHOLESTEROL: 134 mg/dL (ref 0–200)
HDL: 71.2 mg/dL (ref >39.00)
LDL CALC: 41 mg/dL (ref 0–99)
NonHDL: 63.13
Total CHOL/HDL Ratio: 2
Triglycerides: 109 mg/dL (ref 0.0–149.0)
VLDL: 21.8 mg/dL (ref 0.0–40.0)

## 2018-05-20 LAB — CBC WITH DIFFERENTIAL/PLATELET
BASOS PCT: 0.1 % (ref 0.0–3.0)
Basophils Absolute: 0 10*3/uL (ref 0.0–0.1)
Eosinophils Absolute: 0 10*3/uL (ref 0.0–0.7)
Eosinophils Relative: 0 % (ref 0.0–5.0)
HEMATOCRIT: 36.8 % (ref 36.0–46.0)
Hemoglobin: 12.6 g/dL (ref 12.0–15.0)
LYMPHS ABS: 1 10*3/uL (ref 0.7–4.0)
Lymphocytes Relative: 26.9 % (ref 12.0–46.0)
MCHC: 34.4 g/dL (ref 30.0–36.0)
MCV: 91.2 fl (ref 78.0–100.0)
MONOS PCT: 5.1 % (ref 3.0–12.0)
Monocytes Absolute: 0.2 10*3/uL (ref 0.1–1.0)
NEUTROS ABS: 2.4 10*3/uL (ref 1.4–7.7)
NEUTROS PCT: 67.9 % (ref 43.0–77.0)
PLATELETS: 221 10*3/uL (ref 150.0–400.0)
RBC: 4.03 Mil/uL (ref 3.87–5.11)
RDW: 13.6 % (ref 11.5–15.5)
WBC: 3.5 10*3/uL — ABNORMAL LOW (ref 4.0–10.5)

## 2018-05-20 LAB — HEPATIC FUNCTION PANEL
ALBUMIN: 4.9 g/dL (ref 3.5–5.2)
ALK PHOS: 19 U/L — AB (ref 39–117)
ALT: 16 U/L (ref 0–35)
AST: 12 U/L (ref 0–37)
Bilirubin, Direct: 0.1 mg/dL (ref 0.0–0.3)
TOTAL PROTEIN: 7 g/dL (ref 6.0–8.3)
Total Bilirubin: 0.6 mg/dL (ref 0.2–1.2)

## 2018-05-20 LAB — HEMOGLOBIN A1C: Hgb A1c MFr Bld: 5.6 % (ref 4.6–6.5)

## 2018-05-20 LAB — TSH: TSH: 1.54 u[IU]/mL (ref 0.35–4.50)

## 2018-05-20 NOTE — Assessment & Plan Note (Signed)
Chronic problem.  Well controlled.  Asymptomatic.  Check labs.  No anticipated med changes. 

## 2018-05-20 NOTE — Assessment & Plan Note (Signed)
Ongoing issue.  UTD on foot exam, microalbumin.  Due for eye exam- pt to schedule.  Applauded her changes in diet and her regular exercise.  will follow.

## 2018-05-20 NOTE — Patient Instructions (Signed)
Follow up in 3-4 months to recheck diabetes We'll notify you of your lab results and make any changes if needed Continue to work on healthy diet and regular exercise- you look great!!! Schedule your eye exam!!! Call with any questions or concerns Have a great summer!!

## 2018-05-20 NOTE — Assessment & Plan Note (Signed)
Chronic problem.  On Repatha through the lipid clinic.  Applauded her efforts at diet and exercise.  Check labs.

## 2018-05-20 NOTE — Progress Notes (Signed)
   Subjective:    Patient ID: [name], female    DOB: 09-13-1956, 62 y.o.   MRN: [id number]  HPI Hyperlipidemia- chronic problem, on Raptha and Fenofibrate.  Pt is down 13 lbs since last visit.  No abd pain, N/V.  HTN- chronic problem, on Metoprolol 50mg  daily, HCTZ 12.5mg  daily.  +Fatigue (may be depression related).  No CP, SOB, HAs, visual changes, edema.  DM- chronic problem.  Currently diet controlled.  Pt has eliminated all sugar.  Is down 13 lbs.  Exercising regularly.  No numbness/tingling of hands/feet.   Review of Systems For ROS see HPI     Objective:   Physical Exam  Constitutional: She is oriented to person, place, and time. She appears well-developed and well-nourished. No distress.  HENT:  Head: Normocephalic and atraumatic.  Eyes: Pupils are equal, round, and reactive to light. Conjunctivae and EOM are normal.  Neck: Normal range of motion. Neck supple. No thyromegaly present.  Cardiovascular: Normal rate, regular rhythm, normal heart sounds and intact distal pulses.  No murmur heard. Pulmonary/Chest: Effort normal and breath sounds normal. No respiratory distress.  Abdominal: Soft. She exhibits no distension. There is no tenderness.  Musculoskeletal: She exhibits no edema.  Lymphadenopathy:    She has no cervical adenopathy.  Neurological: She is alert and oriented to person, place, and time.  Skin: Skin is warm and dry.  Psychiatric: She has a normal mood and affect. Her behavior is normal.  Vitals reviewed.         Assessment & Plan:

## 2018-05-21 ENCOUNTER — Inpatient Hospital Stay
Admission: RE | Admit: 2018-05-21 | Discharge: 2018-05-21 | Disposition: A | Discharge status: Home / Self Care | Payer: Medicare HMO | Source: Ambulatory Visit | Attending: Orthopaedic Surgery | Admitting: Orthopaedic Surgery

## 2018-05-23 ENCOUNTER — Encounter: Payer: Self-pay | Admitting: General Practice

## 2018-05-23 DIAGNOSIS — R69 Illness, unspecified: Secondary | ICD-10-CM | POA: Diagnosis not present

## 2018-05-26 ENCOUNTER — Ambulatory Visit
Admission: RE | Admit: 2018-05-26 | Discharge: 2018-05-26 | Disposition: A | Discharge status: Home / Self Care | Payer: Medicare HMO | Source: Ambulatory Visit | Attending: Orthopaedic Surgery | Admitting: Orthopaedic Surgery

## 2018-05-26 DIAGNOSIS — M5416 Radiculopathy, lumbar region: Secondary | ICD-10-CM

## 2018-05-26 DIAGNOSIS — M48061 Spinal stenosis, lumbar region without neurogenic claudication: Secondary | ICD-10-CM | POA: Diagnosis not present

## 2018-06-02 ENCOUNTER — Encounter: Payer: Self-pay | Admitting: Family Medicine

## 2018-06-02 ENCOUNTER — Ambulatory Visit: Payer: Self-pay | Admitting: *Deleted

## 2018-06-02 ENCOUNTER — Ambulatory Visit: Payer: Medicare HMO | Admitting: Family Medicine

## 2018-06-02 ENCOUNTER — Other Ambulatory Visit: Payer: Self-pay

## 2018-06-02 VITALS — BP 138/82 | HR 68 | Temp 99.1°F | Ht 68.0 in | Wt 197.4 lb

## 2018-06-02 DIAGNOSIS — R253 Fasciculation: Secondary | ICD-10-CM | POA: Diagnosis not present

## 2018-06-02 DIAGNOSIS — K529 Noninfective gastroenteritis and colitis, unspecified: Secondary | ICD-10-CM | POA: Diagnosis not present

## 2018-06-02 DIAGNOSIS — R42 Dizziness and giddiness: Secondary | ICD-10-CM | POA: Diagnosis not present

## 2018-06-02 LAB — CBC WITH DIFFERENTIAL/PLATELET
BASOS ABS: 0 10*3/uL (ref 0.0–0.1)
Basophils Relative: 0.1 % (ref 0.0–3.0)
EOS ABS: 0 10*3/uL (ref 0.0–0.7)
Eosinophils Relative: 0 % (ref 0.0–5.0)
HCT: 33.8 % — ABNORMAL LOW (ref 36.0–46.0)
Hemoglobin: 11.8 g/dL — ABNORMAL LOW (ref 12.0–15.0)
LYMPHS ABS: 1 10*3/uL (ref 0.7–4.0)
Lymphocytes Relative: 30.2 % (ref 12.0–46.0)
MCHC: 34.8 g/dL (ref 30.0–36.0)
MCV: 91.3 fl (ref 78.0–100.0)
MONO ABS: 0.2 10*3/uL (ref 0.1–1.0)
MONOS PCT: 7 % (ref 3.0–12.0)
NEUTROS ABS: 2 10*3/uL (ref 1.4–7.7)
NEUTROS PCT: 62.7 % (ref 43.0–77.0)
PLATELETS: 224 10*3/uL (ref 150.0–400.0)
RBC: 3.7 Mil/uL — AB (ref 3.87–5.11)
RDW: 13.5 % (ref 11.5–15.5)
WBC: 3.2 10*3/uL — ABNORMAL LOW (ref 4.0–10.5)

## 2018-06-02 LAB — COMPREHENSIVE METABOLIC PANEL
ALBUMIN: 4.6 g/dL (ref 3.5–5.2)
ALK PHOS: 15 U/L — AB (ref 39–117)
ALT: 16 U/L (ref 0–35)
AST: 12 U/L (ref 0–37)
BUN: 15 mg/dL (ref 6–23)
CALCIUM: 9.5 mg/dL (ref 8.4–10.5)
CHLORIDE: 107 meq/L (ref 96–112)
CO2: 26 mEq/L (ref 19–32)
CREATININE: 0.69 mg/dL (ref 0.40–1.20)
GFR: 91.99 mL/min (ref >60.00)
Glucose, Bld: 110 mg/dL — ABNORMAL HIGH (ref 70–99)
POTASSIUM: 4.1 meq/L (ref 3.5–5.1)
Sodium: 140 mEq/L (ref 135–145)
Total Bilirubin: 0.4 mg/dL (ref 0.2–1.2)
Total Protein: 6.8 g/dL (ref 6.0–8.3)

## 2018-06-02 LAB — CK: CK TOTAL: 107 U/L (ref 7–177)

## 2018-06-02 MED ORDER — MECLIZINE HCL 25 MG PO TABS: 25.0000 mg | ORAL_TABLET | Freq: Three times a day (TID) | ORAL | 0 refills | Status: DC | PRN

## 2018-06-02 NOTE — Patient Instructions (Addendum)
Please follow up if symptoms do not improve or as needed.   I will release your lab results to you on your MyChart account with further instructions. Please reply with any questions.   Take the meclizine to help manage the mild dizziness; take deep breaths to relax.   Return if persists.  IF your symptoms worsen, let me know. If you have one sided weakness or poor balance or slurred speech, call 911.

## 2018-06-02 NOTE — Progress Notes (Signed)
Subjective  CC:  Chief Complaint  Patient presents with  . Dizziness    Patient states she had Diarrhea non stop two days ago and yesterday and today she woke up with dizziness, ringing in her ears and muscle weakness    HPI: [name] is a 62 y.o. female who presents to the office today to address the problems listed above in the chief complaint.  62 year old female presents with dizziness and "weakness".  Reports 2 days ago had acute onset of watery frequent diarrhea without blood, mucus, abdominal cramping, nausea or vomiting.  No fevers.  Symptoms lasted throughout the day.  She ate and drank normally.  By the end of the knee she had a little bit of dizziness and felt weak.  Next morning symptoms persisted but did not have any further GI symptoms.  By the end of the night she felt fine.  Awoke today with similar mild dizziness and weakness.  Her description of weakness sounds more consistent with fatigue.  She denies lightheadedness, true ataxia, paresis, slurred speech, double vision.  She has had vertigo in the past.  She has not been around any sick contacts.  She drinks plenty of fluids yesterday, does not feel that she is dehydrated at this point.  No recent travel or unusual foods.  Right now, she feels more anxious than anything. Assessment  1. Vertigo   2. Gastroenteritis   3. Muscle twitch      Plan   Dizziness: Possible vertigo versus dehydration versus other.  Check lab work.  Trial of Antivert and discussed red flags and what to do in case of emergent symptoms.  No red flags identified now.  Normal neuro exam.  Patient feels reassured and reports she is feeling better.  Possible gastroenteritis: Check lab work.  Continue normal diet and monitoring symptoms.  Seems to have resolved.  Check CK  Follow up: Return if symptoms worsen or fail to improve.   Orders Placed This Encounter  Procedures  . CBC with Differential/Platelet  . CK (Creatine Kinase)  .  Comprehensive metabolic panel   Meds ordered this encounter  Medications  . meclizine (ANTIVERT) 25 MG tablet    Sig: Take 1 tablet (25 mg total) by mouth 3 (three) times daily as needed for dizziness.    Dispense:  30 tablet    Refill:  0      I reviewed the patients updated PMH, FH, and SocHx.    Patient Active Problem List   Diagnosis Date Noted  . Diet-controlled diabetes mellitus ([place]) 02/09/2018  . Statin intolerance 11/04/2017  . Physical exam 08/04/2017  . Family history of early CAD 03/10/2017  . HTN (hypertension) 02/15/2017  . Herniated nucleus pulposus, L5-S1 06/22/2014  . Asthma, moderate persistent 10/09/2013  . Fatigue 12/12/2012  . GERD (gastroesophageal reflux disease) 09/02/2012  . Vitamin D deficiency 09/15/2010  . DEPRESSIVE DISORDER 07/17/2010  . RHINITIS 06/14/2009  . MUSCLE WEAKNESS (GENERALIZED) 01/03/2009  . Hyperlipidemia 10/10/2008  . BIPOLAR AFFECTIVE DISORDER 10/10/2008  . ADD 10/10/2008   Current Meds  Medication Sig  . Cholecalciferol (VITAMIN D) 2000 units tablet Take 2,000 Units by mouth daily.  . clonazePAM (KLONOPIN) 2 MG tablet 1 mg 4 (four) times daily as needed.   Marland Kitchen estradiol (ESTRACE) 1 MG tablet TAKE 1 TABLET BY MOUTH EVERY DAY  . Evolocumab (REPATHA SURECLICK) 951 MG/ML SOAJ Inject 140 mg into the skin every 14 (fourteen) days.  . fenofibrate (TRICOR) 145 MG tablet Take 1  tablet (145 mg total) by mouth daily.  Marland Kitchen gabapentin (NEURONTIN) 600 MG tablet Take 600 mg by mouth 4 (four) times daily.   Marland Kitchen lamoTRIgine (LAMICTAL) 200 MG tablet Take 200 mg by mouth 2 (two) times daily.   . metoprolol succinate (TOPROL-XL) 50 MG 24 hr tablet TAKE 1 TABLET BY MOUTH EVERY DAY *WITH OR IMMEDIATELY FOLLOWING A MEAL*  . OLANZapine (ZYPREXA) 10 MG tablet Take 10 mg by mouth daily as needed.  Marland Kitchen omega-3 acid ethyl esters (LOVAZA) 1 g capsule Take 2 capsules (2 g total) by mouth 2 (two) times daily.  . QUEtiapine (SEROQUEL XR) 400 MG 24 hr tablet Take one  tablet every evening at bedtime  . QUEtiapine (SEROQUEL) 50 MG tablet TK 1 T PO TID  . venlafaxine (EFFEXOR-XR) 75 MG 24 hr capsule Take 225 mg by mouth daily.   . [DISCONTINUED] hydrochlorothiazide (HYDRODIURIL) 12.5 MG tablet TAKE 1 TABLET BY MOUTH EVERY DAY    Allergies: Patient is allergic to nitrofurantoin monohyd macro; statins; sulfa antibiotics; alprazolam; amoxicillin; and prednisone. Family History: Patient family history includes Coronary artery disease in her father; Heart attack (age of onset: 87) in her father; Heart disease in her father; Hyperlipidemia in her brother; Hypertension in her brother; Lung disease in her mother. Social History:  Patient  reports that she has never smoked. She has never used smokeless tobacco. She reports that she does not drink alcohol or use drugs.  Review of Systems: Constitutional: Negative for fever malaise or anorexia Cardiovascular: negative for chest pain Respiratory: negative for SOB or persistent cough Gastrointestinal: negative for abdominal pain  Objective  Vitals: BP 138/82   Pulse 68   Temp 99.1 F (37.3 C)   Ht 5\' 8"  (1.727 m)   Wt 197 lb 6.4 oz (89.5 kg)   LMP 11/09/2000   SpO2 96%   BMI 30.01 kg/m  General: no acute distress , A&Ox3, appears well, moves normally to and from exam table Psych: anxious HEENT: PEERL, conjunctiva normal, Oropharynx moist,neck is supple, nl TMs, nl EOMI bilaterally w/o nystagmus Cardiovascular:  RRR without murmur or gallop.  Respiratory:  Good breath sounds bilaterally, CTAB with normal respiratory effort Gastrointestinal: soft, flat abdomen, normal active bowel sounds, no palpable masses, no hepatosplenomegaly, no appreciated hernias Skin:  Warm, no rashes Neuro: cr n 2-12 nl; nl RAM and FtoN with mild symmetric intention tremor. Neg Romberg, nl 5/5 strength B UE and LE     Commons side effects, risks, benefits, and alternatives for medications and treatment plan prescribed today were  discussed, and the patient expressed understanding of the given instructions. Patient is instructed to call or message via MyChart if he/she has any questions or concerns regarding our treatment plan. No barriers to understanding were identified. We discussed Red Flag symptoms and signs in detail. Patient expressed understanding regarding what to do in case of urgent or emergency type symptoms.   Medication list was reconciled, printed and provided to the patient in AVS. Patient instructions and summary information was reviewed with the patient as documented in the AVS. This note was prepared with assistance of Dragon voice recognition software. Occasional wrong-word or sound-a-like substitutions may have occurred due to the inherent limitations of voice recognition software

## 2018-06-02 NOTE — Telephone Encounter (Signed)
Pt. Returned phone call to request office appt.  Pt. Verb. Being very frustrated with this process in trying to get in to see her doctor.  Informed that there have been two attempts to contact her this AM, per phone.  Pt. Stated she has not received any messages today from our office.    C/o muscle weakness in her arms and legs, and feels like she is losing her balance.  Reported she has "Vertigo".  Stated "it doesn't feel right; it is scary, and I need to see my doctor."  Reported she has not fallen, but voiced concern about this, as she has to go up and down steps to get into her house.  Denied headache, vision changes, numbness or tingling of extremities, fever or chills.  Reported she had diarrhea two days ago.  During assessment questions, pt. became very upset, and stated she has never had to go through this type of questioning before, when trying to get an appt.  Stated "[name] is going to hear about this."  Advised with the severity of her symptoms and with hx of Hypertension and diabetes, she should be checked in the ER.  Stated "I am not going to the Emergency Room!"  I just need to an appt., I need some help."    Called FC at office.  Advised ER is 1st recommendation, and if refuses, she must get someone to drive her to the office for her safety, and the safety of others on the road.  Advised pt. That the recommendation is to be eval. In the ER.  She refused.  Advised she must have another adult to drive her to the office; pt. Stated she can call her brother.  Given an appt. this morning at 11:30 AM, per pt. request.   Reason for Disposition . [1] Dizziness (vertigo) present now AND [2] one or more stroke risk factors (i.e., hypertension, diabetes, prior stroke/TIA/heart attack)  (Exception: prior physician evaluation for this AND no different/worse than usual)  Answer Assessment - Initial Assessment Questions 1. DESCRIPTION: "Describe your dizziness."     Like I am losing my balance; It  doesn't feel right  2. VERTIGO: "Do you feel like either you or the room is spinning or tilting?"      I have vertigo 3. LIGHTHEADED: "Do you feel lightheaded?" (e.g., somewhat faint, woozy, weak upon standing)     Low bad is it?"  "Can you walk?"   - MILD - Feels unsteady but walking normally.   - MODERATE - Feels very unsteady when walking, but not falling; interferes with normal activities (e.g., school, work) .   - SEVERE - Unable to walk without falling (requires assistance).    Almost severe 5. ONSET:  "When did the dizziness begin?"     yesterday 6. AGGRAVATING FACTORS: "Does anything make it worse?" (e.g., standing, change in head position)    Moving about 7. CAUSE: "What do you think is causing the dizziness?"     Unknown  8. RECURRENT SYMPTOM: "Have you had dizziness before?" If so, ask: "When was the last time?" "What happened that time?"     No, this is new 9. OTHER SYMPTOMS: "Do you have any other symptoms?" (e.g., headache, weakness, numbness, vomiting, earache)     C/o weakness in arms and legs, dizziness, denied headache or vision problems; denied nausea or vomiting; denied numbness/ tingling of extremities; denied fever/ chills  Protocols used: DIZZINESS - VERTIGO-A-AH

## 2018-06-09 ENCOUNTER — Encounter: Payer: Self-pay | Admitting: Dietician

## 2018-06-09 ENCOUNTER — Encounter: Payer: Medicare HMO | Attending: Family Medicine | Admitting: Dietician

## 2018-06-09 DIAGNOSIS — Z6829 Body mass index (BMI) 29.0-29.9, adult: Secondary | ICD-10-CM | POA: Insufficient documentation

## 2018-06-09 DIAGNOSIS — M47816 Spondylosis without myelopathy or radiculopathy, lumbar region: Secondary | ICD-10-CM | POA: Diagnosis not present

## 2018-06-09 DIAGNOSIS — M545 Low back pain: Secondary | ICD-10-CM | POA: Diagnosis not present

## 2018-06-09 DIAGNOSIS — E119 Type 2 diabetes mellitus without complications: Secondary | ICD-10-CM | POA: Insufficient documentation

## 2018-06-09 DIAGNOSIS — M5136 Other intervertebral disc degeneration, lumbar region: Secondary | ICD-10-CM | POA: Diagnosis not present

## 2018-06-09 DIAGNOSIS — Z713 Dietary counseling and surveillance: Secondary | ICD-10-CM | POA: Insufficient documentation

## 2018-06-09 LAB — HM DIABETES EYE EXAM

## 2018-06-09 NOTE — Patient Instructions (Addendum)
Take the vitamin D consistently. Take your multivitamin daily. Increase the variety of your diet  Increase your vegetable intake.  Have 2-3 carbohydrate servings with each meal. Stay hydrated.

## 2018-06-09 NOTE — Progress Notes (Signed)
Diabetes Self-Management Education  Visit Type: Follow-up  Appt. Start Time: 1030 Appt. End Time: 1130  06/09/2018  [name], identified by name and date of birth, is a 62 y.o. female with a diagnosis of Diabetes: type 2.   Other history includes bipolar disorder, HTN, hyperlipidemia (she sees a lipid specialist), OSA on c-pap, constipation, GERD.  She is concerned today because she has been feeling so tired and anxiety seems to be creeping back.  She states that her hemoglobin was low.  She has lost from 231 lbs in January to 193 lbs today. Labs: Cholesterol 134, HDL 71, LDL 41, triglycerides 109 (05/20/18), Hemoglobin 11.8.  Patient lives alone.  She worked for [employer] until they downsized.  She is on disability.  She has a pet sitting business and volunteers as well.  She states that financially she is doing better.  She is pet sitting more dogs and doing a lot of walking.  She moved in June and has not gotten back to cooking much.  Her diet currently is lacking balance.  She will not have carbohydrate at some meals.  Lipids are much improved.  ASSESSMENT  Weight 193 lb (87.5 kg), last menstrual period 11/09/2000. Body mass index is 29.35 kg/m.  Diabetes Self-Management Education - 06/09/18 2153      Visit Information   Visit Type  Follow-up      Psychosocial Assessment   Patient Belief/Attitude about Diabetes  Motivated to manage diabetes    Self-care barriers  None    Self-management support  Doctor's office    Other persons present  Patient    Patient Concerns  Nutrition/Meal planning;Healthy Lifestyle;Weight Control;Problem Solving    Special Needs  None    Preferred Learning Style  No preference indicated    Learning Readiness  Ready    How often do you need to have someone help you when you read instructions, pamphlets, or other written materials from your doctor or pharmacy?  1 - Never      Pre-Education Assessment   Patient understands the diabetes disease and treatment  process.  Demonstrates understanding / competency    Patient understands incorporating nutritional management into lifestyle.  Needs Review    Patient undertands incorporating physical activity into lifestyle.  Demonstrates understanding / competency    Patient understands using medications safely.  Demonstrates understanding / competency    Patient understands monitoring blood glucose, interpreting and using results  Demonstrates understanding / competency    Patient understands prevention, detection, and treatment of acute complications.  Demonstrates understanding / competency    Patient understands prevention, detection, and treatment of chronic complications.  Demonstrates understanding / competency    Patient understands how to develop strategies to address psychosocial issues.  Demonstrates understanding / competency    Patient understands how to develop strategies to promote health/change behavior.  Needs Review      Complications   Last HgB A1C per patient/outside source  5.6 % 05/20/18    How often do you check your blood sugar?  0 times/day (not testing)      Dietary Intake   Breakfast  Regular oatmeal with plain yogurt, 1 tsp honey    Lunch  Grilled chicken salad with crackers OR grilled hamburger no bun    Snack (afternoon)  nuts    Dinner  scrambled eggs, plain yogurt OR grilled chicken, vege, starch    Snack (evening)  cottage cheese and fruit    Beverage(s)  water, zero gatorade  Exercise   Exercise Type  Light (walking / raking leaves)    How many days per week to you exercise?  7    How many minutes per day do you exercise?  60    Total minutes per week of exercise  420      Patient Education   Previous Diabetes Education  Yes (please comment) 7 months ago    Nutrition management   Meal options for control of blood glucose level and chronic complications.    Personal strategies to promote health  Lifestyle issues that need to be addressed for better diabetes care       Individualized Goals (developed by patient)   Nutrition  General guidelines for healthy choices and portions discussed    Physical Activity  Exercise 5-7 days per week;60 minutes per day    Medications  Not Applicable    Monitoring   Not Applicable    Problem Solving  balanced meals    Health Coping  discuss diabetes with (comment) MD, RD, CDE      Patient Self-Evaluation of Goals - Patient rates self as meeting previously set goals (% of time)   Nutrition  >75%    Physical Activity  >75%    Medications  Not Applicable    Monitoring  Not Applicable    Problem Solving  50 - 75 %    Reducing Risk  >75%    Health Coping  >75%      Post-Education Assessment   Patient understands the diabetes disease and treatment process.  Demonstrates understanding / competency    Patient understands incorporating nutritional management into lifestyle.  Demonstrates understanding / competency    Patient undertands incorporating physical activity into lifestyle.  Demonstrates understanding / competency    Patient understands using medications safely.  Demonstrates understanding / competency    Patient understands monitoring blood glucose, interpreting and using results  Demonstrates understanding / competency    Patient understands prevention, detection, and treatment of acute complications.  Demonstrates understanding / competency    Patient understands prevention, detection, and treatment of chronic complications.  Demonstrates understanding / competency    Patient understands how to develop strategies to address psychosocial issues.  Demonstrates understanding / competency    Patient understands how to develop strategies to promote health/change behavior.  Demonstrates understanding / competency      Outcomes   Expected Outcomes  Demonstrated interest in learning. Expect positive outcomes    Future DMSE  3-4 months    Program Status  Completed      Subsequent Visit   Since your last visit have  you continued or begun to take your medications as prescribed?  Not on Medications    Since your last visit have you had your blood pressure checked?  Yes    Since your last visit have you experienced any weight changes?  Loss    Weight Loss (lbs)  14    Since your last visit, are you checking your blood glucose at least once a day?  No       Individualized Plan for Diabetes Self-Management Training:   Learning Objective:  Patient will have a greater understanding of diabetes self-management. Patient education plan is to attend individual and/or group sessions per assessed needs and concerns.   Plan:   Patient Instructions  Take the vitamin D consistently. Take your multivitamin daily. Increase the variety of your diet  Increase your vegetable intake.  Have 2-3 carbohydrate servings with each meal. Stay  hydrated.   Expected Outcomes:  Demonstrated interest in learning. Expect positive outcomes  Education material provided: My plate  If problems or questions, patient to contact team via:  Phone  Future DSME appointment: 3-4 months

## 2018-06-15 DIAGNOSIS — R69 Illness, unspecified: Secondary | ICD-10-CM | POA: Diagnosis not present

## 2018-06-16 ENCOUNTER — Other Ambulatory Visit: Payer: Self-pay | Admitting: Interventional Cardiology

## 2018-06-23 ENCOUNTER — Other Ambulatory Visit: Payer: Self-pay

## 2018-06-23 ENCOUNTER — Ambulatory Visit (INDEPENDENT_AMBULATORY_CARE_PROVIDER_SITE_OTHER): Payer: Medicare HMO | Admitting: Family Medicine

## 2018-06-23 ENCOUNTER — Encounter: Payer: Self-pay | Admitting: Family Medicine

## 2018-06-23 VITALS — BP 128/80 | HR 76 | Temp 98.1°F | Resp 16 | Ht 68.0 in | Wt 197.4 lb

## 2018-06-23 DIAGNOSIS — D649 Anemia, unspecified: Secondary | ICD-10-CM | POA: Diagnosis not present

## 2018-06-23 NOTE — Progress Notes (Signed)
   Subjective:    Patient ID: [name], female    DOB: [birth year], 62 y.o.   MRN: [id number]  HPI Lab f/u- pt had labs done on 7/25 and was told she had 'new onset mild anemia' w/ hgb of 11.8.  She started an OTC iron supplement.  Labs were drawn b/c pt was dizzy at that time.  Denies palpitations, SOB.   Review of Systems For ROS see HPI     Objective:   Physical Exam  Constitutional: She appears well-developed and well-nourished. No distress.  HENT:  Head: Normocephalic and atraumatic.  Cardiovascular: Normal rate, regular rhythm, normal heart sounds and intact distal pulses.  No murmur heard. Pulmonary/Chest: Effort normal and breath sounds normal. No respiratory distress.  Musculoskeletal: She exhibits no edema.  Skin: Skin is warm and dry. No erythema. No pallor.  Vitals reviewed.         Assessment & Plan:  Anemia- reassured pt that w/ a Hgb of 11.8 there is absolutely no cause for concern.  Since she has been taking OTC iron supplement, will repeat CBC today for peace of mind.  Pt expressed understanding and is in agreement w/ plan.

## 2018-06-23 NOTE — Patient Instructions (Addendum)
Follow up as scheduled to recheck diabetes We'll notify you of your lab results and make any changes if needed Continue to work on healthy diet and regular exercise- you're doing great!!! Call Dr Ebony Hail and let them know what's going on Take the Zyprexa if needed Call with any questions or concerns Hang in there!!!

## 2018-06-24 LAB — CBC WITH DIFFERENTIAL/PLATELET
BASOS ABS: 0 10*3/uL (ref 0.0–0.1)
Basophils Relative: 0.4 % (ref 0.0–3.0)
EOS ABS: 0 10*3/uL (ref 0.0–0.7)
Eosinophils Relative: 0.1 % (ref 0.0–5.0)
HEMATOCRIT: 32.7 % — AB (ref 36.0–46.0)
Hemoglobin: 11.3 g/dL — ABNORMAL LOW (ref 12.0–15.0)
LYMPHS PCT: 32.4 % (ref 12.0–46.0)
Lymphs Abs: 1.3 10*3/uL (ref 0.7–4.0)
MCHC: 34.6 g/dL (ref 30.0–36.0)
MCV: 91.1 fl (ref 78.0–100.0)
MONOS PCT: 6.6 % (ref 3.0–12.0)
Monocytes Absolute: 0.3 10*3/uL (ref 0.1–1.0)
Neutro Abs: 2.4 10*3/uL (ref 1.4–7.7)
Neutrophils Relative %: 60.5 % (ref 43.0–77.0)
Platelets: 224 10*3/uL (ref 150.0–400.0)
RBC: 3.59 Mil/uL — ABNORMAL LOW (ref 3.87–5.11)
RDW: 13.5 % (ref 11.5–15.5)
WBC: 4 10*3/uL (ref 4.0–10.5)

## 2018-06-28 DIAGNOSIS — M6281 Muscle weakness (generalized): Secondary | ICD-10-CM | POA: Diagnosis not present

## 2018-06-28 DIAGNOSIS — G8921 Chronic pain due to trauma: Secondary | ICD-10-CM | POA: Diagnosis not present

## 2018-06-28 DIAGNOSIS — M545 Low back pain: Secondary | ICD-10-CM | POA: Diagnosis not present

## 2018-07-05 DIAGNOSIS — R69 Illness, unspecified: Secondary | ICD-10-CM | POA: Diagnosis not present

## 2018-07-06 DIAGNOSIS — H2511 Age-related nuclear cataract, right eye: Secondary | ICD-10-CM | POA: Diagnosis not present

## 2018-07-06 DIAGNOSIS — H25043 Posterior subcapsular polar age-related cataract, bilateral: Secondary | ICD-10-CM | POA: Diagnosis not present

## 2018-07-06 DIAGNOSIS — H25013 Cortical age-related cataract, bilateral: Secondary | ICD-10-CM | POA: Diagnosis not present

## 2018-07-06 DIAGNOSIS — H2513 Age-related nuclear cataract, bilateral: Secondary | ICD-10-CM | POA: Diagnosis not present

## 2018-07-13 ENCOUNTER — Other Ambulatory Visit: Payer: Self-pay | Admitting: Interventional Cardiology

## 2018-07-13 DIAGNOSIS — R69 Illness, unspecified: Secondary | ICD-10-CM | POA: Diagnosis not present

## 2018-07-20 DIAGNOSIS — R69 Illness, unspecified: Secondary | ICD-10-CM | POA: Diagnosis not present

## 2018-08-02 NOTE — Progress Notes (Addendum)
Subjective:   [name] is a 62 y.o. female who presents for Medicare Annual (Subsequent) preventive examination.  Review of Systems:  No ROS.  Medicare Wellness Visit. Additional risk factors are reflected in the social history.    Sleep patterns: Sleeps 8 hours, uses CPAP. Home Safety/Smoke Alarms: Feels safe in home. Smoke alarms in place.  Living environment; residence and Firearm Safety: Lives alone in 1 story home.  Seat Belt Safety/Bike Helmet: Wears seat belt.   Female:   [id number]     Mammo-01/05/2018, negative.        Dexa scan-07/11/2015, normal.         CCS-Colonoscopy 12/10/2006, polyps. GI consult ordered.      Objective:     Vitals: LMP 11/09/2000   There is no height or weight on file to calculate BMI.  Advanced Directives 09/02/2017 07/28/2017 08/13/2015 11/30/2014 09/13/2014 09/01/2012 08/29/2012  Does Patient Have a Medical Advance Directive? Yes Yes Yes No;Yes Yes Patient has advance directive, copy not in chart Patient has advance directive, copy not in chart  Type of Advance Directive - Living will;Healthcare Power of [place];Living will Healthcare Power of [place] Living will [place];Living will [place];Living will  Does patient want to make changes to medical advance directive? - - No - Patient declined No - Patient declined - - -  Copy of [place] in Chart? - No - copy requested No - copy requested No - copy requested - Copy requested from other (Comment) Copy requested from family  Pre-existing out of facility DNR order (yellow form or pink MOST form) - - - - - No -    Tobacco Social History   Tobacco Use  Smoking Status Never Smoker  Smokeless Tobacco Never Used     Counseling given: Not Answered   Past Medical History:  Diagnosis Date  . Allergic rhinitis   . Anxiety   . Arthritis    R knee, Left Knee  . Asthma   . Bipolar disorder ([place])   .  Cataract   . Complication of anesthesia    had bad anxiety after anesthesia  . Constipation   . Constipation, chronic   . Depression   . Diabetes mellitus without complication ([place])   . Dysrhythmia    Palpations occ, takes Tenormin  . GERD (gastroesophageal reflux disease)   . Heart murmur   . Hyperlipidemia   . Hypertension   . MVP (mitral valve prolapse)   . Urinary tract infection    frequent, see Urololgist 12/16/2010   Past Surgical History:  Procedure Laterality Date  . ABDOMINAL HYSTERECTOMY  2002   no oophorectomy  . BACK SURGERY  2001   L5 - S1  . NASAL SINUS SURGERY  01/2010   x2  . TONSILLECTOMY    . TOTAL KNEE ARTHROPLASTY  12/23/2011   Procedure: TOTAL KNEE ARTHROPLASTY;  Surgeon: Ninetta Lights, MD;  Location: [place];  Service: Orthopedics;  Laterality: Right;  DR MURPHY WANTS 120 MINUTES FOR SURGERY  . TOTAL KNEE ARTHROPLASTY  08/31/2012   left knee  . TOTAL KNEE ARTHROPLASTY  08/31/2012   Procedure: TOTAL KNEE ARTHROPLASTY;  Surgeon: Ninetta Lights, MD;  Location: [place] [facility];  Service: Orthopedics;  Laterality: Left;   Family History  Problem Relation Age of Onset  . Coronary artery disease Father   . Heart attack Father 81  . Heart disease Father   . Lung disease Mother  Mycobacterium avium intracellulare  . Hypertension Brother   . Hyperlipidemia Brother   . Anesthesia problems Neg Hx    Social History   Socioeconomic History  . Marital status: Single    Spouse name: Not on file  . Number of children: 0  . Years of education: 44  . Highest education level: Not on file  Occupational History  . Occupation: retired//disability  Social Needs  . Financial resource strain: Not on file  . Food insecurity:    Worry: Not on file    Inability: Not on file  . Transportation needs:    Medical: Not on file    Non-medical: Not on file  Tobacco Use  . Smoking status: Never Smoker  . Smokeless tobacco: Never Used  Substance and Sexual Activity  .  Alcohol use: No    Alcohol/week: 0.0 standard drinks    Comment: Occasional glass of wine/"hardly ever"  . Drug use: No  . Sexual activity: Never    Birth control/protection: Abstinence  Lifestyle  . Physical activity:    Days per week: Not on file    Minutes per session: Not on file  . Stress: Not on file  Relationships  . Social connections:    Talks on phone: Not on file    Gets together: Not on file    Attends religious service: Not on file    Active member of club or organization: Not on file    Attends meetings of clubs or organizations: Not on file    Relationship status: Not on file  Other Topics Concern  . Not on file  Social History Narrative   Lives alone with 2 cats. Has pet sitting business.   2 cups of coffee a day, Rare soda     Outpatient Encounter Medications as of 08/03/2018  Medication Sig  . Cholecalciferol (VITAMIN D) 2000 units tablet Take 2,000 Units by mouth daily.  . clonazePAM (KLONOPIN) 2 MG tablet 1 mg 4 (four) times daily as needed.   Marland Kitchen estradiol (ESTRACE) 1 MG tablet TAKE 1 TABLET BY MOUTH EVERY DAY  . Evolocumab (REPATHA SURECLICK) 811 MG/ML SOAJ Inject 140 mg into the skin every 14 (fourteen) days.  . fenofibrate (TRICOR) 145 MG tablet Take 1 tablet (145 mg total) by mouth daily. Please schedule appt with Dr. Irish Lack for more refills, thanks! 2nd attmpt  . Ferrous Fumarate (IRON) 18 MG TBCR Take by mouth.  . gabapentin (NEURONTIN) 600 MG tablet Take 600 mg by mouth 4 (four) times daily.   Marland Kitchen lamoTRIgine (LAMICTAL) 200 MG tablet Take 200 mg by mouth 2 (two) times daily.   . meclizine (ANTIVERT) 25 MG tablet Take 1 tablet (25 mg total) by mouth 3 (three) times daily as needed for dizziness.  . metoprolol succinate (TOPROL-XL) 50 MG 24 hr tablet TAKE 1 TABLET BY MOUTH EVERY DAY *WITH OR IMMEDIATELY FOLLOWING A MEAL*  . Multiple Vitamin (MULTIVITAMIN WITH MINERALS) TABS tablet Take 1 tablet by mouth daily.  Marland Kitchen OLANZapine (ZYPREXA) 10 MG tablet Take 10  mg by mouth daily as needed.  Marland Kitchen omega-3 acid ethyl esters (LOVAZA) 1 g capsule Take 2 capsules (2 g total) by mouth 2 (two) times daily.  . QUEtiapine (SEROQUEL XR) 400 MG 24 hr tablet Take one tablet every evening at bedtime  . QUEtiapine (SEROQUEL) 50 MG tablet TK 1 T PO TID  . venlafaxine (EFFEXOR-XR) 75 MG 24 hr capsule Take 225 mg by mouth daily.    No facility-administered encounter medications on  file as of 08/03/2018.     Activities of Daily Living In your present state of health, do you have any difficulty performing the following activities: 05/20/2018 02/09/2018  Hearing? N N  Vision? N N  Difficulty concentrating or making decisions? N N  Walking or climbing stairs? N N  Dressing or bathing? N N  Doing errands, shopping? N N  Preparing Food and eating ? - -  Using the Toilet? - -  In the past six months, have you accidently leaked urine? - -  Do you have problems with loss of bowel control? - -  Managing your Medications? - -  Managing your Finances? - -  Housekeeping or managing your Housekeeping? - -  Some recent data might be hidden    Patient Care Team: Midge Minium, MD as PCP - General Birdie Riddle Aundra Millet, MD Megan Salon, MD as Consulting Physician (Gynecology) Jettie Booze, MD as Consulting Physician (Cardiology) Leta Baptist, MD as Consulting Physician (Otolaryngology) Brayton Caves, MD as Referring Physician (Psychiatry) Janeth Rase, NP as Nurse Practitioner (Adult Health Nurse Practitioner) Leilani Merl, Hyman Hopes, LCSW as Social Worker (Licensed Clinical Social Worker)    Assessment:   This is a routine wellness examination for [name].  Exercise Activities and Dietary recommendations   Diet (meal preparation, eat out, water intake, caffeinated beverages, dairy products, fruits and vegetables): Drinks water. Avoids sodas and juices.   Breakfast: oatmeal and fruit, coffee; yogurt with almonds Lunch: salad Dinner: protein and vegetables  Goals        Patient Stated   . Increase physical activity and eat healthy. (pt-stated)      Other   . LDL CALC < 130     LDL-P number goal < 1300 nmol/L       Fall Risk Fall Risk  06/09/2018 05/20/2018 02/09/2018 01/04/2018 12/21/2017  Falls in the past year? No No No No No  Number falls in past yr: - - - - -  Injury with Fall? - - - - -  Follow up - - - - -    Depression Screen PHQ 2/9 Scores 06/09/2018 05/20/2018 02/09/2018 01/04/2018  PHQ - 2 Score 1 0 0 0  PHQ- 9 Score - 0 0 -     Cognitive Function MMSE - Mini Mental State Exam 08/13/2015  Orientation to time 5  Orientation to Place 5  Registration 3  Attention/ Calculation 5  Recall 1  Language- name 2 objects 2  Language- repeat 1  Language- follow 3 step command 3  Language- read & follow direction 1  Write a sentence 1  Copy design 1  Total score 28        Immunization History  Administered Date(s) Administered  . Influenza Split 09/02/2012  . Influenza,inj,Quad PF,6+ Mos 10/09/2013, 08/13/2015, 08/17/2016, 07/28/2017  . Pneumococcal Polysaccharide-23 09/02/2012  . Tdap 07/15/2012    Screening Tests Health Maintenance  Topic Date Due  . OPHTHALMOLOGY EXAM  08/14/1967  . INFLUENZA VACCINE  06/09/2018  . PAP SMEAR  07/10/2018  . HIV Screening  02/10/2019 (Originally 08[birth year]13/1972)  . COLONOSCOPY  05/21/2019 (Originally 12/10/2016)  . FOOT EXAM  11/04/2018  . URINE MICROALBUMIN  11/04/2018  . HEMOGLOBIN A1C  11/20/2018  . MAMMOGRAM  01/06/2020  . TETANUS/TDAP  07/15/2022  . PNEUMOCOCCAL POLYSACCHARIDE VACCINE AGE 52-64 HIGH RISK  Completed  . Hepatitis C Screening  Completed        Plan:    Schedule colonoscopy.   Shingles vaccine at pharmacy  Bring a copy of your living will and/or healthcare power of attorney to your next office visit.  Continue doing brain stimulating activities (puzzles, reading, adult coloring books, staying active) to keep memory sharp.   I have personally reviewed and noted the  following in the patient's chart:   . Medical and social history . Use of alcohol, tobacco or illicit drugs  . Current medications and supplements . Functional ability and status . Nutritional status . Physical activity . Advanced directives . List of other physicians . Hospitalizations, surgeries, and ER visits in previous 12 months . Vitals . Screenings to include cognitive, depression, and falls . Referrals and appointments  In addition, I have reviewed and discussed with patient certain preventive protocols, quality metrics, and best practice recommendations. A written personalized care plan for preventive services as well as general preventive health recommendations were provided to patient.     Gerilyn Nestle, RN  08/02/2018  PCP Notes: -Cataract surgery next week -PHQ9=9 -F/U with PCP 09/02/18  Reviewed documentation provided by RN and agree w/ above.  Annye Asa, MD

## 2018-08-03 ENCOUNTER — Ambulatory Visit (INDEPENDENT_AMBULATORY_CARE_PROVIDER_SITE_OTHER): Payer: Medicare HMO

## 2018-08-03 ENCOUNTER — Other Ambulatory Visit: Payer: Self-pay

## 2018-08-03 VITALS — BP 110/60 | HR 68 | Ht 68.0 in | Wt 197.0 lb

## 2018-08-03 DIAGNOSIS — Z23 Encounter for immunization: Secondary | ICD-10-CM

## 2018-08-03 DIAGNOSIS — E669 Obesity, unspecified: Secondary | ICD-10-CM

## 2018-08-03 DIAGNOSIS — Z1211 Encounter for screening for malignant neoplasm of colon: Secondary | ICD-10-CM

## 2018-08-03 DIAGNOSIS — Z0000 Encounter for general adult medical examination without abnormal findings: Secondary | ICD-10-CM | POA: Diagnosis not present

## 2018-08-03 MED ORDER — ZOSTER VAC RECOMB ADJUVANTED 50 MCG/0.5ML IM SUSR: 0.5000 mL | Freq: Once | INTRAMUSCULAR | 1 refills | Status: AC

## 2018-08-03 NOTE — Patient Instructions (Addendum)
Schedule colonoscopy.   Shingles vaccine at pharmacy. rr  Bring a copy of your living will and/or healthcare power of attorney to your next office visit.  Continue doing brain stimulating activities (puzzles, reading, adult coloring books, staying active) to keep memory sharp.   Health Maintenance, Female Adopting a healthy lifestyle and getting preventive care can go a long way to promote health and wellness. Talk with your health care provider about what schedule of regular examinations is right for you. This is a good chance for you to check in with your provider about disease prevention and staying healthy. In between checkups, there are plenty of things you can do on your own. Experts have done a lot of research about which lifestyle changes and preventive measures are most likely to keep you healthy. Ask your health care provider for more information. Weight and diet Eat a healthy diet  Be sure to include plenty of vegetables, fruits, low-fat dairy products, and lean protein.  Do not eat a lot of foods high in solid fats, added sugars, or salt.  Get regular exercise. This is one of the most important things you can do for your health. ? Most adults should exercise for at least 150 minutes each week. The exercise should increase your heart rate and make you sweat (moderate-intensity exercise). ? Most adults should also do strengthening exercises at least twice a week. This is in addition to the moderate-intensity exercise.  Maintain a healthy weight  Body mass index (BMI) is a measurement that can be used to identify possible weight problems. It estimates body fat based on height and weight. Your health care provider can help determine your BMI and help you achieve or maintain a healthy weight.  For females 62 years of age and older: ? A BMI below 18.5 is considered underweight. ? A BMI of 18.5 to 24.9 is normal. ? A BMI of 25 to 29.9 is considered overweight. ? A BMI of 30 and  above is considered obese.  Watch levels of cholesterol and blood lipids  You should start having your blood tested for lipids and cholesterol at 61 years of age, then have this test every 5 years.  You may need to have your cholesterol levels checked more often if: ? Your lipid or cholesterol levels are high. ? You are older than 61 years of age. ? You are at high risk for heart disease.  Cancer screening Lung Cancer  Lung cancer screening is recommended for adults 78-3 years old who are at high risk for lung cancer because of a history of smoking.  A yearly low-dose CT scan of the lungs is recommended for people who: ? Currently smoke. ? Have quit within the past 15 years. ? Have at least a 30-pack-year history of smoking. A pack year is smoking an average of one pack of cigarettes a day for 1 year.  Yearly screening should continue until it has been 15 years since you quit.  Yearly screening should stop if you develop a health problem that would prevent you from having lung cancer treatment.  Breast Cancer  Practice breast self-awareness. This means understanding how your breasts normally appear and feel.  It also means doing regular breast self-exams. Let your health care provider know about any changes, no matter how small.  If you are in your 20s or 30s, you should have a clinical breast exam (CBE) by a health care provider every 1-3 years as part of a regular health exam.  If you are 40 or older, have a CBE every year. Also consider having a breast X-ray (mammogram) every year.  If you have a family history of breast cancer, talk to your health care provider about genetic screening.  If you are at high risk for breast cancer, talk to your health care provider about having an MRI and a mammogram every year.  Breast cancer gene (BRCA) assessment is recommended for women who have family members with BRCA-related cancers. BRCA-related cancers  include: ? Breast. ? Ovarian. ? Tubal. ? Peritoneal cancers.  Results of the assessment will determine the need for genetic counseling and BRCA1 and BRCA2 testing.  Cervical Cancer Your health care provider may recommend that you be screened regularly for cancer of the pelvic organs (ovaries, uterus, and vagina). This screening involves a pelvic examination, including checking for microscopic changes to the surface of your cervix (Pap test). You may be encouraged to have this screening done every 3 years, beginning at age 21.  For women ages 30-65, health care providers may recommend pelvic exams and Pap testing every 3 years, or they may recommend the Pap and pelvic exam, combined with testing for human papilloma virus (HPV), every 5 years. Some types of HPV increase your risk of cervical cancer. Testing for HPV may also be done on women of any age with unclear Pap test results.  Other health care providers may not recommend any screening for nonpregnant women who are considered low risk for pelvic cancer and who do not have symptoms. Ask your health care provider if a screening pelvic exam is right for you.  If you have had past treatment for cervical cancer or a condition that could lead to cancer, you need Pap tests and screening for cancer for at least 20 years after your treatment. If Pap tests have been discontinued, your risk factors (such as having a new sexual partner) need to be reassessed to determine if screening should resume. Some women have medical problems that increase the chance of getting cervical cancer. In these cases, your health care provider may recommend more frequent screening and Pap tests.  Colorectal Cancer  This type of cancer can be detected and often prevented.  Routine colorectal cancer screening usually begins at 61 years of age and continues through 61 years of age.  Your health care provider may recommend screening at an earlier age if you have risk factors  for colon cancer.  Your health care provider may also recommend using home test kits to check for hidden blood in the stool.  A small camera at the end of a tube can be used to examine your colon directly (sigmoidoscopy or colonoscopy). This is done to check for the earliest forms of colorectal cancer.  Routine screening usually begins at age 50.  Direct examination of the colon should be repeated every 5-10 years through 61 years of age. However, you may need to be screened more often if early forms of precancerous polyps or small growths are found.  Skin Cancer  Check your skin from head to toe regularly.  Tell your health care provider about any new moles or changes in moles, especially if there is a change in a mole's shape or color.  Also tell your health care provider if you have a mole that is larger than the size of a pencil eraser.  Always use sunscreen. Apply sunscreen liberally and repeatedly throughout the day.  Protect yourself by wearing long sleeves, pants, a wide-brimmed hat, and   sunglasses whenever you are outside.  Heart disease, diabetes, and high blood pressure  High blood pressure causes heart disease and increases the risk of stroke. High blood pressure is more likely to develop in: ? People who have blood pressure in the high end of the normal range (130-139/85-89 mm Hg). ? People who are overweight or obese. ? People who are African American.  If you are 25-70 years of age, have your blood pressure checked every 3-5 years. If you are 86 years of age or older, have your blood pressure checked every year. You should have your blood pressure measured twice-once when you are at a hospital or clinic, and once when you are not at a hospital or clinic. Record the average of the two measurements. To check your blood pressure when you are not at a hospital or clinic, you can use: ? An automated blood pressure machine at a pharmacy. ? A home blood pressure monitor.  If  you are between 34 years and 33 years old, ask your health care provider if you should take aspirin to prevent strokes.  Have regular diabetes screenings. This involves taking a blood sample to check your fasting blood sugar level. ? If you are at a normal weight and have a low risk for diabetes, have this test once every three years after 61 years of age. ? If you are overweight and have a high risk for diabetes, consider being tested at a younger age or more often. Preventing infection Hepatitis B  If you have a higher risk for hepatitis B, you should be screened for this virus. You are considered at high risk for hepatitis B if: ? You were born in a country where hepatitis B is common. Ask your health care provider which countries are considered high risk. ? Your parents were born in a high-risk country, and you have not been immunized against hepatitis B (hepatitis B vaccine). ? You have HIV or AIDS. ? You use needles to inject street drugs. ? You live with someone who has hepatitis B. ? You have had sex with someone who has hepatitis B. ? You get hemodialysis treatment. ? You take certain medicines for conditions, including cancer, organ transplantation, and autoimmune conditions.  Hepatitis C  Blood testing is recommended for: ? Everyone born from [birth year] through 1964. ? Anyone with known risk factors for hepatitis C.  Sexually transmitted infections (STIs)  You should be screened for sexually transmitted infections (STIs) including gonorrhea and chlamydia if: ? You are sexually active and are younger than 61 years of age. ? You are older than 61 years of age and your health care provider tells you that you are at risk for this type of infection. ? Your sexual activity has changed since you were last screened and you are at an increased risk for chlamydia or gonorrhea. Ask your health care provider if you are at risk.  If you do not have HIV, but are at risk, it may be recommended  that you take a prescription medicine daily to prevent HIV infection. This is called pre-exposure prophylaxis (PrEP). You are considered at risk if: ? You are sexually active and do not regularly use condoms or know the HIV status of your partner(s). ? You take drugs by injection. ? You are sexually active with a partner who has HIV.  Talk with your health care provider about whether you are at high risk of being infected with HIV. If you choose to begin PrEP, you  should first be tested for HIV. You should then be tested every 3 months for as long as you are taking PrEP. Pregnancy  If you are premenopausal and you may become pregnant, ask your health care provider about preconception counseling.  If you may become pregnant, take 400 to 800 micrograms (mcg) of folic acid every day.  If you want to prevent pregnancy, talk to your health care provider about birth control (contraception). Osteoporosis and menopause  Osteoporosis is a disease in which the bones lose minerals and strength with aging. This can result in serious bone fractures. Your risk for osteoporosis can be identified using a bone density scan.  If you are 65 years of age or older, or if you are at risk for osteoporosis and fractures, ask your health care provider if you should be screened.  Ask your health care provider whether you should take a calcium or vitamin D supplement to lower your risk for osteoporosis.  Menopause may have certain physical symptoms and risks.  Hormone replacement therapy may reduce some of these symptoms and risks. Talk to your health care provider about whether hormone replacement therapy is right for you. Follow these instructions at home:  Schedule regular health, dental, and eye exams.  Stay current with your immunizations.  Do not use any tobacco products including cigarettes, chewing tobacco, or electronic cigarettes.  If you are pregnant, do not drink alcohol.  If you are  breastfeeding, limit how much and how often you drink alcohol.  Limit alcohol intake to no more than 1 drink per day for nonpregnant women. One drink equals 12 ounces of beer, 5 ounces of wine, or 1 ounces of hard liquor.  Do not use street drugs.  Do not share needles.  Ask your health care provider for help if you need support or information about quitting drugs.  Tell your health care provider if you often feel depressed.  Tell your health care provider if you have ever been abused or do not feel safe at home. This information is not intended to replace advice given to you by your health care provider. Make sure you discuss any questions you have with your health care provider. Document Released: 05/11/2011 Document Revised: 04/02/2016 Document Reviewed: 07/30/2015 Elsevier Interactive Patient Education  2018 [employer] Inc[employer]  

## 2018-08-04 ENCOUNTER — Other Ambulatory Visit: Payer: Self-pay | Admitting: Family Medicine

## 2018-08-09 DIAGNOSIS — H25811 Combined forms of age-related cataract, right eye: Secondary | ICD-10-CM | POA: Diagnosis not present

## 2018-08-09 DIAGNOSIS — H2511 Age-related nuclear cataract, right eye: Secondary | ICD-10-CM | POA: Diagnosis not present

## 2018-08-10 DIAGNOSIS — H2511 Age-related nuclear cataract, right eye: Secondary | ICD-10-CM | POA: Diagnosis not present

## 2018-08-10 DIAGNOSIS — H25042 Posterior subcapsular polar age-related cataract, left eye: Secondary | ICD-10-CM | POA: Diagnosis not present

## 2018-08-10 DIAGNOSIS — H2512 Age-related nuclear cataract, left eye: Secondary | ICD-10-CM | POA: Diagnosis not present

## 2018-08-10 DIAGNOSIS — H25012 Cortical age-related cataract, left eye: Secondary | ICD-10-CM | POA: Diagnosis not present

## 2018-08-11 ENCOUNTER — Telehealth: Payer: Self-pay | Admitting: Interventional Cardiology

## 2018-08-11 ENCOUNTER — Other Ambulatory Visit: Payer: Self-pay | Admitting: *Deleted

## 2018-08-11 MED ORDER — FENOFIBRATE 145 MG PO TABS: 145.0000 mg | ORAL_TABLET | Freq: Every day | ORAL | 0 refills | Status: DC

## 2018-08-11 NOTE — Telephone Encounter (Signed)
New Message   *STAT* If patient is at the pharmacy, call can be transferred to refill team.   1. Which medications need to be refilled? (please list name of each medication and dose if known) fenofibrate (TRICOR) 145 MG tablet  2. Which pharmacy/location (including street and city if local pharmacy) is medication to be sent to? [place], [place]  3. Do they need a 30 day or 90 day supply? [place]

## 2018-08-12 ENCOUNTER — Telehealth: Payer: Self-pay | Admitting: Interventional Cardiology

## 2018-08-12 NOTE — Telephone Encounter (Signed)
New Message    Patient called in to cancel her appt on the 7th because she is having eye surgery and has to take her contact out so she can not drive. I rescheduled her with a PA on 10/29. But she insist on being worked in on Dr. Irish Lack schedule sooner because she says "he really wants to see her". Because he worked her in before.  Please contact patient.

## 2018-08-12 NOTE — Telephone Encounter (Signed)
Most recent lipid profile is only 61 months old while patient on current therapy. Okay to follow up with APP as previously scheduled and repeat blood work before the end of the year.   No need for appointment with Lipid clinic at this time.

## 2018-08-12 NOTE — Telephone Encounter (Signed)
Okay.  Thanks.

## 2018-08-12 NOTE — Telephone Encounter (Signed)
Returned call to patient. Patient states that on her medicine bottle it says that Dr. Irish Lack said that he needs to see her or her fenofibrate will not be refilled. She states that she does not have any complaints and has only been following with our lipid clinic. Patient does have current LIPIDS on file. Patient was last seen by Dr. Irish Lack on 03/2017 and was deemed a PRN follow-up. Patient was already scheduled with PA on 10/29. Patient will be seeing her PCP on 10/25 and she is going to see if her PCP will take over filling her meds and call us back and let us know. Will also send a message to [facility] to determine if any f/u is need with them. Patient has enough supply of her meds to get through appointment.

## 2018-08-15 ENCOUNTER — Ambulatory Visit: Payer: Medicare HMO | Admitting: Interventional Cardiology

## 2018-08-16 DIAGNOSIS — H25812 Combined forms of age-related cataract, left eye: Secondary | ICD-10-CM | POA: Diagnosis not present

## 2018-08-16 DIAGNOSIS — H2512 Age-related nuclear cataract, left eye: Secondary | ICD-10-CM | POA: Diagnosis not present

## 2018-08-22 DIAGNOSIS — R69 Illness, unspecified: Secondary | ICD-10-CM | POA: Diagnosis not present

## 2018-08-30 DIAGNOSIS — R69 Illness, unspecified: Secondary | ICD-10-CM | POA: Diagnosis not present

## 2018-09-02 ENCOUNTER — Encounter: Payer: Self-pay | Admitting: Family Medicine

## 2018-09-02 ENCOUNTER — Other Ambulatory Visit: Payer: Self-pay

## 2018-09-02 ENCOUNTER — Ambulatory Visit (INDEPENDENT_AMBULATORY_CARE_PROVIDER_SITE_OTHER): Payer: Medicare HMO | Admitting: Family Medicine

## 2018-09-02 VITALS — BP 110/76 | HR 76 | Temp 98.3°F | Resp 18 | Ht 68.0 in | Wt 198.2 lb

## 2018-09-02 DIAGNOSIS — E119 Type 2 diabetes mellitus without complications: Secondary | ICD-10-CM | POA: Diagnosis not present

## 2018-09-02 DIAGNOSIS — I10 Essential (primary) hypertension: Secondary | ICD-10-CM | POA: Diagnosis not present

## 2018-09-02 DIAGNOSIS — E782 Mixed hyperlipidemia: Secondary | ICD-10-CM

## 2018-09-02 LAB — BASIC METABOLIC PANEL
BUN: 17 mg/dL (ref 6–23)
CHLORIDE: 106 meq/L (ref 96–112)
CO2: 26 meq/L (ref 19–32)
Calcium: 9.7 mg/dL (ref 8.4–10.5)
Creatinine, Ser: 0.74 mg/dL (ref 0.40–1.20)
GFR: 84.79 mL/min (ref >60.00)
Glucose, Bld: 94 mg/dL (ref 70–99)
POTASSIUM: 4.6 meq/L (ref 3.5–5.1)
SODIUM: 141 meq/L (ref 135–145)

## 2018-09-02 LAB — HEMOGLOBIN A1C: HEMOGLOBIN A1C: 5.6 % (ref 4.6–6.5)

## 2018-09-02 MED ORDER — FENOFIBRATE 145 MG PO TABS: 145.0000 mg | ORAL_TABLET | Freq: Every day | ORAL | 6 refills | Status: DC

## 2018-09-02 MED ORDER — ZOSTER VACCINE LIVE 19400 UNT/0.65ML ~~LOC~~ SUSR: 0.6500 mL | Freq: Once | SUBCUTANEOUS | 1 refills | Status: AC

## 2018-09-02 NOTE — Progress Notes (Signed)
   Subjective:    Patient ID: [name], female    DOB: 02-12-1956, 62 y.o.   MRN: [id number]  HPI DM- chronic problem. UTD on microalbumin, foot exam, eye exam.  Currently controlled w/ diet and exercise.  Last A1C 5.6.  Continues to walk regularly.  No numbness/tingling of hands/feet.  Hyperlipidemia- chronic problem, now on Repatha through the lipid clinic and Fenofibrate 145mg  daily.  She is due for upcoming appt at lipid clinic.  No abd pain, N/V  HTN- chronic problem, on Metoprolol 50mg  daily.  Excellent control today.  Denies CP, SOB, HAs, visual changes, edema.  Review of Systems For ROS see HPI     Objective:   Physical Exam  Constitutional: She is oriented to person, place, and time. She appears well-developed and well-nourished. No distress.  HENT:  Head: Normocephalic and atraumatic.  Eyes: Pupils are equal, round, and reactive to light. Conjunctivae and EOM are normal.  Neck: Normal range of motion. Neck supple. No thyromegaly present.  Cardiovascular: Normal rate, regular rhythm, normal heart sounds and intact distal pulses.  No murmur heard. Pulmonary/Chest: Effort normal and breath sounds normal. No respiratory distress.  Abdominal: Soft. She exhibits no distension. There is no tenderness.  Musculoskeletal: She exhibits no edema.  Lymphadenopathy:    She has no cervical adenopathy.  Neurological: She is alert and oriented to person, place, and time.  Skin: Skin is warm and dry.  Psychiatric: She has a normal mood and affect. Her behavior is normal.  Vitals reviewed.         Assessment & Plan:

## 2018-09-02 NOTE — Patient Instructions (Addendum)
Schedule your complete physical in 3-4 months We'll notify you of your lab results and make any changes if needed Continue to work on healthy diet and regular exercise- you can do it! Call with any questions or concerns Happy Fall!!!

## 2018-09-02 NOTE — Assessment & Plan Note (Signed)
Chronic problem.  Now following w/ Lip[facility] Clinic and on [place].  Will fill Fenofibrate at pt's request but told her she will need to continue to see [facility] for [facility].  Pt is aware.

## 2018-09-02 NOTE — Assessment & Plan Note (Signed)
Ongoing issue for pt.  Hx of excellent control.  UTD on eye exam, foot exam, microalbumin.  Check labs and determine if change in tx plan needed.

## 2018-09-02 NOTE — Assessment & Plan Note (Signed)
Chronic problem.  Excellent control.  Asymptomatic.  Check labs.  No anticipated med changes.  Will follow. 

## 2018-09-05 ENCOUNTER — Encounter: Payer: Self-pay | Admitting: General Practice

## 2018-09-05 ENCOUNTER — Telehealth: Payer: Self-pay | Admitting: Pharmacist

## 2018-09-05 DIAGNOSIS — E782 Mixed hyperlipidemia: Secondary | ICD-10-CM

## 2018-09-05 NOTE — Telephone Encounter (Signed)
[employer] safetynet paperwork for 2020 renewal mailed today with order for new lipid panel.

## 2018-09-05 NOTE — Telephone Encounter (Signed)
New message    Patient called she cancelled her appt for 09/06/18 her pcp will be taking over care for the fenofibrate

## 2018-09-05 NOTE — Telephone Encounter (Signed)
Called and spoke to patient. She states that her PCP has taken over prescribing her fenofibrate so she cancelled her appointment tomorrow. She states that she does not have any cardiac concerns at this time.

## 2018-09-06 ENCOUNTER — Ambulatory Visit: Payer: Medicare HMO | Admitting: Physician Assistant

## 2018-09-07 ENCOUNTER — Encounter: Payer: Self-pay | Admitting: Family Medicine

## 2018-09-08 ENCOUNTER — Ambulatory Visit: Payer: Medicare HMO | Admitting: Dietician

## 2018-09-09 DIAGNOSIS — G4733 Obstructive sleep apnea (adult) (pediatric): Secondary | ICD-10-CM | POA: Diagnosis not present

## 2018-09-19 ENCOUNTER — Telehealth: Payer: Self-pay | Admitting: *Deleted

## 2018-09-19 MED ORDER — ZOSTER VAC RECOMB ADJUVANTED 50 MCG/0.5ML IM SUSR: 0.5000 mL | Freq: Once | INTRAMUSCULAR | 1 refills | Status: AC

## 2018-09-19 NOTE — Telephone Encounter (Signed)
Pt notified and rx sent to local pharmacy.

## 2018-09-19 NOTE — Addendum Note (Signed)
Addended by: Davis Gourd on: 09/19/2018 04:20 PM   Modules accepted: Orders

## 2018-09-19 NOTE — Telephone Encounter (Signed)
Pt has Medicare and would need a prescription sent to pharmacy for Shingrix

## 2018-09-19 NOTE — Telephone Encounter (Signed)
Copied from [place] [id number]. Topic: General - Other >> Sep 19, 2018  1:47 PM Mcneil, Ja-Kwan wrote: Reason for CRM: Pt states she would like to schedule an appt for the shingles vaccine. Pt does not know when she should have the vaccine. Pt requests a call back.

## 2018-09-22 ENCOUNTER — Ambulatory Visit: Payer: Medicare HMO | Admitting: Podiatry

## 2018-09-22 VITALS — BP 130/88 | HR 70

## 2018-09-22 DIAGNOSIS — L600 Ingrowing nail: Secondary | ICD-10-CM

## 2018-09-22 NOTE — Patient Instructions (Addendum)

## 2018-09-23 NOTE — Progress Notes (Signed)
Subjective:   Patient ID: [name], female   DOB: 62 y.o.   MRN: [id number]   HPI 62 year old female presents the office today for concerns of ingrown toenail to left big toe on both corners which is been ongoing for the last 7 months has been getting more painful.  She denies any redness or drainage or any swelling to the toenail sites.  She denies any recent injury or trauma.  She said no treatment other than try to trim her toenails.  She has no other concerns.   Review of Systems  All other systems reviewed and are negative.  Past Medical History:  Diagnosis Date  . Allergic rhinitis   . Anxiety   . Arthritis    R knee, Left Knee  . Asthma   . Bipolar disorder ([place])   . Cataract   . Complication of anesthesia    had bad anxiety after anesthesia  . Constipation   . Constipation, chronic   . Depression   . Diabetes mellitus without complication ([place])   . Dysrhythmia    Palpations occ, takes Tenormin  . GERD (gastroesophageal reflux disease)   . Heart murmur   . Hyperlipidemia   . Hypertension   . MVP (mitral valve prolapse)   . Urinary tract infection    frequent, see Urololgist 12/16/2010    Past Surgical History:  Procedure Laterality Date  . ABDOMINAL HYSTERECTOMY  2002   no oophorectomy  . BACK SURGERY  2001   L5 - S1  . CATARACT EXTRACTION    . NASAL SINUS SURGERY  01/2010   x2  . TONSILLECTOMY    . TOTAL KNEE ARTHROPLASTY  12/23/2011   Procedure: TOTAL KNEE ARTHROPLASTY;  Surgeon: Ninetta Lights, MD;  Location: [facility];  Service: Orthopedics;  Laterality: Right;  DR MURPHY WANTS 120 MINUTES FOR SURGERY  . TOTAL KNEE ARTHROPLASTY  08/31/2012   left knee  . TOTAL KNEE ARTHROPLASTY  08/31/2012   Procedure: TOTAL KNEE ARTHROPLASTY;  Surgeon: Ninetta Lights, MD;  Location: [facility];  Service: Orthopedics;  Laterality: Left;     Current Outpatient Medications:  .  BESIVANCE 0.6 % SUSP, , Disp: , Rfl:  .  Cholecalciferol (VITAMIN D) 2000 units tablet, Take  2,000 Units by mouth daily., Disp: , Rfl:  .  clonazePAM (KLONOPIN) 2 MG tablet, 1 mg 4 (four) times daily as needed. , Disp: , Rfl: 0 .  DUREZOL 0.05 % EMUL, , Disp: , Rfl:  .  estradiol (ESTRACE) 1 MG tablet, TAKE 1 TABLET BY MOUTH EVERY DAY, Disp: 90 tablet, Rfl: 3 .  Evolocumab (REPATHA SURECLICK) 449 MG/ML SOAJ, Inject 140 mg into the skin every 14 (fourteen) days., Disp: 2 pen, Rfl: 11 .  fenofibrate (TRICOR) 145 MG tablet, Take 1 tablet (145 mg total) by mouth daily., Disp: 30 tablet, Rfl: 6 .  Ferrous Fumarate (IRON) 18 MG TBCR, Take by mouth., Disp: , Rfl:  .  gabapentin (NEURONTIN) 600 MG tablet, Take 600 mg by mouth 4 (four) times daily. , Disp: , Rfl:  .  lamoTRIgine (LAMICTAL) 200 MG tablet, Take 200 mg by mouth 2 (two) times daily. , Disp: , Rfl:  .  meclizine (ANTIVERT) 25 MG tablet, Take 1 tablet (25 mg total) by mouth 3 (three) times daily as needed for dizziness., Disp: 30 tablet, Rfl: 0 .  metoprolol succinate (TOPROL-XL) 50 MG 24 hr tablet, TAKE 1 TABLET BY MOUTH EVERY DAY *WITH OR IMMEDIATELY FOLLOWING A MEAL*, Disp: 90  tablet, Rfl: 1 .  Multiple Vitamin (MULTIVITAMIN WITH MINERALS) TABS tablet, Take 1 tablet by mouth daily., Disp: , Rfl:  .  OLANZapine (ZYPREXA) 10 MG tablet, Take 10 mg by mouth daily as needed., Disp: , Rfl:  .  omega-3 acid ethyl esters (LOVAZA) 1 g capsule, Take 2 capsules (2 g total) by mouth 2 (two) times daily., Disp: 120 capsule, Rfl: 5 .  PROLENSA 0.07 % SOLN, , Disp: , Rfl:  .  QUEtiapine (SEROQUEL XR) 400 MG 24 hr tablet, Take one tablet every evening at bedtime, Disp: , Rfl: 0 .  QUEtiapine (SEROQUEL) 50 MG tablet, TK 1 T PO TID, Disp: , Rfl: 3 .  venlafaxine (EFFEXOR-XR) 75 MG 24 hr capsule, Take 225 mg by mouth daily. , Disp: , Rfl:   Allergies  Allergen Reactions  . Nitrofurantoin Monohyd Macro Nausea Only    Severe headache  . Statins Other (See Comments)    Severe weakness, pain  . Sulfa Antibiotics Nausea And Vomiting  . Alprazolam  Anxiety and Other (See Comments)    Hyper   . Amoxicillin Rash  . Prednisone Anxiety         Objective:  Physical Exam  General: AAO x3, NAD  Dermatological: Incurvation present to both medial lateral aspects of the left hallux toenail with tenderness palpation.  There is localized edema to the nail corners but there is no erythema or increase in warmth there is no ascending cellulitis.  No fluctuation or crepitation.  No open lesions.  Vascular: Dorsalis Pedis artery and Posterior Tibial artery pedal pulses are 2/4 bilateral with immedate capillary fill time. There is no pain with calf compression, swelling, warmth, erythema.   Neruologic: Grossly intact via light touch bilateral.  Protective threshold with Semmes Wienstein monofilament intact to all pedal sites bilateral.   Musculoskeletal: No gross boney pedal deformities bilateral. No pain, crepitus, or limitation noted with foot and ankle range of motion bilateral. Muscular strength 5/5 in all groups tested bilateral.  Gait: Unassisted, Nonantalgic.       Assessment:   Ingrown toenail left hallux    Plan:  -Treatment options discussed including all alternatives, risks, and complications -Etiology of symptoms were discussed -At this time, the patient is requesting partial nail removal with chemical matricectomy to the symptomatic portion of the nail. Risks and complications were discussed with the patient for which they understand and written consent was obtained. Under sterile conditions a total of 3 mL of a mixture of 2% lidocaine plain and 0.5% Marcaine plain was infiltrated in a hallux block fashion. Once anesthetized, the skin was prepped in sterile fashion. A tourniquet was then applied. Next the medial and lateral aspect of hallux nail border was then sharply excised making sure to remove the entire offending nail border. Once the nails were ensured to be removed area was debrided and the underlying skin was intact. There  is no purulence identified in the procedure. Next phenol was then applied under standard conditions and copiously irrigated. Silvadene was applied. A dry sterile dressing was applied. After application of the dressing the tourniquet was removed and there is found to be an immediate capillary refill time to the digit. The patient tolerated the procedure well any complications. Post procedure instructions were discussed the patient for which he verbally understood. Follow-up in one week for nail check or sooner if any problems are to arise. Discussed signs/symptoms of infection and directed to call the office immediately should any occur or go directly to  the emergency room. In the meantime, encouraged to call the office with any questions, concerns, changes symptoms.  Trula Slade DPM

## 2018-09-26 DIAGNOSIS — R69 Illness, unspecified: Secondary | ICD-10-CM | POA: Diagnosis not present

## 2018-09-28 ENCOUNTER — Ambulatory Visit (INDEPENDENT_AMBULATORY_CARE_PROVIDER_SITE_OTHER): Payer: Self-pay

## 2018-09-28 DIAGNOSIS — L600 Ingrowing nail: Secondary | ICD-10-CM

## 2018-09-28 MED ORDER — DOXYCYCLINE HYCLATE 100 MG PO TABS: 100.0000 mg | ORAL_TABLET | Freq: Two times a day (BID) | ORAL | 0 refills | Status: DC

## 2018-09-28 NOTE — Patient Instructions (Signed)

## 2018-09-29 NOTE — Progress Notes (Signed)
Patient is here today for follow-up appointment, recent procedure performed on 09/22/2018, removal of ingrown toenail hallux left.  She states that the area is still swollen and painful, and that the Epson salt soaks burn.  Noted redness around the base of the nail, with warmth to the touch.  No purulent drainage and no malodor.  Discussed the signs and symptoms with Dr. Carman Ching who prescribed her doxycycline 100 mg twice daily x10 days.  Discussed signs symptoms of infection.  Verbal and written instructions given to the patient.  I did advise her to switch to soapy water soaks for 2 days, then try the Epson salt soaks until there is no redness, no pain, and no drainage from the area.  She is to follow-up next week for reevaluation if needed.

## 2018-10-04 ENCOUNTER — Encounter: Payer: Self-pay | Admitting: Podiatry

## 2018-10-04 ENCOUNTER — Ambulatory Visit: Payer: Self-pay | Admitting: Podiatry

## 2018-10-04 DIAGNOSIS — Z9889 Other specified postprocedural states: Secondary | ICD-10-CM

## 2018-10-04 DIAGNOSIS — L600 Ingrowing nail: Secondary | ICD-10-CM

## 2018-10-04 NOTE — Patient Instructions (Signed)

## 2018-10-10 NOTE — Progress Notes (Signed)
Subjective: [name] is a 62 y.o.  female returns to office today for follow up evaluation after having left Hallux partial nail avulsion performed. Patient has been soaking using epsom salts and applying topical antibiotic covered with bandaid daily. She states that the redness is better and her pain has improved. Denies any drainage or pus. Patient denies fevers, chills, nausea, vomiting. Denies any calf pain, chest pain, SOB.   Objective:  Vitals: Reviewed  General: Well developed, nourished, in no acute distress, alert and oriented x3   Dermatology: Skin is warm, dry and supple bilateral. Left hallux nail border appears to be clean, dry, with mild granular tissue and surrounding scab. There is decreased erythema and there is no ascending cellulitis, edema, drainage/purulence. The remaining nails appear unremarkable at this time. There are no other lesions or other signs of infection present.  Neurovascular status: Intact. No lower extremity swelling; No pain with calf compression bilateral.  Musculoskeletal: Decreased tenderness to palpation of the left hallux nail fold. Muscular strength within normal limits bilateral.   Assesement and Plan: S/p partial nail avulsion, doing well.   -Continue soaking in epsom salts twice a day followed by antibiotic ointment and a band-aid. Can leave uncovered at night. Continue this until completely healed.  -Finish course of antibiotics.  -If the area has not healed in 2 weeks, call the office for follow-up appointment, or sooner if any problems arise.  -Monitor for any signs/symptoms of infection. Call the office immediately if any occur or go directly to the emergency room. Call with any questions/concerns.  Celesta Gentile, DPM

## 2018-10-12 DIAGNOSIS — R69 Illness, unspecified: Secondary | ICD-10-CM | POA: Diagnosis not present

## 2018-10-18 ENCOUNTER — Ambulatory Visit: Payer: Medicare HMO | Admitting: Podiatry

## 2018-10-18 ENCOUNTER — Encounter: Payer: Self-pay | Admitting: Podiatry

## 2018-10-18 DIAGNOSIS — L600 Ingrowing nail: Secondary | ICD-10-CM | POA: Diagnosis not present

## 2018-10-19 NOTE — Progress Notes (Signed)
Subjective: [name] is a 62 y.o.  female returns to office today for follow up evaluation after having left Hallux partial nail avulsion performed.  Since the last saw her she states that she is doing much better.  She has no pain she denies any drainage of pus or any red streaks.  She had some minimal redness previously and this is almost resolved as well.  She been soaking in Epsom salts cover with antibiotic ointment and a bandage. Patient denies fevers, chills, nausea, vomiting. Denies any calf pain, chest pain, SOB.   Objective:  Vitals: Reviewed  General: Well developed, nourished, in no acute distress, alert and oriented x3   Dermatology: Skin is warm, dry and supple bilateral. Left hallux nail border appears to be clean, dry, with mild granular tissue and surrounding scab. There is minimal erythema and there is no ascending cellulitis, edema, drainage/purulence. The remaining nails appear unremarkable at this time. There are no other lesions or other signs of infection present.  Neurovascular status: Intact. No lower extremity swelling; No pain with calf compression bilateral.  Musculoskeletal: There is no tenderness to palpation of the left hallux nail fold. Muscular strength within normal limits bilateral.   Assesement and Plan: S/p partial nail avulsion, doing well.   -Continue soaking in epsom salts twice a day followed by antibiotic ointment and a band-aid. Can leave uncovered at night. Continue this until completely healed.  -Overall she is doing much better.  Want to continue soaking Epsom salts cover with antibiotic ointment and a bandage during the day.  If not completely resolve the next 2 weeks I want her to follow-up with me or sooner if any issues are to arise.  She agrees this plan has no further questions or concerns.  Trula Slade DPM

## 2018-10-23 ENCOUNTER — Other Ambulatory Visit: Payer: Self-pay | Admitting: Interventional Cardiology

## 2018-10-27 ENCOUNTER — Other Ambulatory Visit: Payer: Self-pay | Admitting: Pharmacist

## 2018-10-27 MED ORDER — OMEGA-3-ACID ETHYL ESTERS 1 G PO CAPS: 2.0000 g | ORAL_CAPSULE | Freq: Two times a day (BID) | ORAL | 5 refills | Status: DC

## 2018-10-27 NOTE — Telephone Encounter (Signed)
If we are not seeing her regularly, would defer to PCP.

## 2018-11-03 ENCOUNTER — Ambulatory Visit: Payer: Self-pay

## 2018-11-03 NOTE — Telephone Encounter (Signed)
FYI

## 2018-11-03 NOTE — Telephone Encounter (Signed)
Please call pt and ask her to limit salt until appt tomorrow

## 2018-11-03 NOTE — Telephone Encounter (Signed)
Called and informed pt. She stated an understanding.

## 2018-11-03 NOTE — Telephone Encounter (Signed)
Pt. Reports she noticed "some leg swelling" 3 days ago. "My shoes feel tighter and my socks are leaving indention's." Denies any other symptoms. Appointment made for tomorrow at pt.'s preference.   Reason for Disposition . [1] MODERATE leg swelling (e.g., swelling extends up to knees) AND [2] new onset or worsening  Answer Assessment - Initial Assessment Questions 1. ONSET: "When did the swelling start?" (e.g., minutes, hours, days)     Started 3 days ago 2. LOCATION: "What part of the leg is swollen?"  "Are both legs swollen or just one leg?"     Both legs - shin area 3. SEVERITY: "How bad is the swelling?" (e.g., localized; mild, moderate, severe)  - Localized - small area of swelling localized to one leg  - MILD pedal edema - swelling limited to foot and ankle, pitting edema < 1/4 inch (6 mm) deep, rest and elevation eliminate most or all swelling  - MODERATE edema - swelling of lower leg to knee, pitting edema > 1/4 inch (6 mm) deep, rest and elevation only partially reduce swelling  - SEVERE edema - swelling extends above knee, facial or hand swelling present      Mild 4. REDNESS: "Does the swelling look red or infected?"     No 5. PAIN: "Is the swelling painful to touch?" If so, ask: "How painful is it?"   (Scale 1-10; mild, moderate or severe)     No 6. FEVER: "Do you have a fever?" If so, ask: "What is it, how was it measured, and when did it start?"      No 7. CAUSE: "What do you think is causing the leg swelling?"     Unsure 8. MEDICAL HISTORY: "Do you have a history of heart failure, kidney disease, liver failure, or cancer?"     No 9. RECURRENT SYMPTOM: "Have you had leg swelling before?" If so, ask: "When was the last time?" "What happened that time?"     No 10. OTHER SYMPTOMS: "Do you have any other symptoms?" (e.g., chest pain, difficulty breathing)       No 11. PREGNANCY: "Is there any chance you are pregnant?" "When was your last menstrual period?"        No  Protocols used: LEG SWELLING AND EDEMA-A-AH

## 2018-11-04 ENCOUNTER — Encounter: Payer: Self-pay | Admitting: Family Medicine

## 2018-11-04 ENCOUNTER — Ambulatory Visit: Payer: Medicare HMO | Admitting: Family Medicine

## 2018-11-04 VITALS — BP 122/84 | HR 86 | Temp 98.4°F | Resp 15 | Ht 68.0 in | Wt 202.0 lb

## 2018-11-04 DIAGNOSIS — R5383 Other fatigue: Secondary | ICD-10-CM

## 2018-11-04 DIAGNOSIS — M7989 Other specified soft tissue disorders: Secondary | ICD-10-CM | POA: Diagnosis not present

## 2018-11-04 LAB — HEPATIC FUNCTION PANEL
ALT: 13 U/L (ref 0–35)
AST: 13 U/L (ref 0–37)
Albumin: 4.6 g/dL (ref 3.5–5.2)
Alkaline Phosphatase: 16 U/L — ABNORMAL LOW (ref 39–117)
BILIRUBIN TOTAL: 0.5 mg/dL (ref 0.2–1.2)
Bilirubin, Direct: 0.1 mg/dL (ref 0.0–0.3)
Total Protein: 6.7 g/dL (ref 6.0–8.3)

## 2018-11-04 LAB — CBC WITH DIFFERENTIAL/PLATELET
BASOS PCT: 0.1 % (ref 0.0–3.0)
Basophils Absolute: 0 10*3/uL (ref 0.0–0.1)
EOS PCT: 0 % (ref 0.0–5.0)
Eosinophils Absolute: 0 10*3/uL (ref 0.0–0.7)
HCT: 35 % — ABNORMAL LOW (ref 36.0–46.0)
Hemoglobin: 12 g/dL (ref 12.0–15.0)
LYMPHS ABS: 1 10*3/uL (ref 0.7–4.0)
Lymphocytes Relative: 32.2 % (ref 12.0–46.0)
MCHC: 34.3 g/dL (ref 30.0–36.0)
MCV: 91.4 fl (ref 78.0–100.0)
Monocytes Absolute: 0.2 10*3/uL (ref 0.1–1.0)
Monocytes Relative: 6.1 % (ref 3.0–12.0)
NEUTROS ABS: 1.9 10*3/uL (ref 1.4–7.7)
NEUTROS PCT: 61.6 % (ref 43.0–77.0)
PLATELETS: 214 10*3/uL (ref 150.0–400.0)
RBC: 3.83 Mil/uL — ABNORMAL LOW (ref 3.87–5.11)
RDW: 13.4 % (ref 11.5–15.5)
WBC: 3.1 10*3/uL — ABNORMAL LOW (ref 4.0–10.5)

## 2018-11-04 LAB — BASIC METABOLIC PANEL
BUN: 17 mg/dL (ref 6–23)
CHLORIDE: 103 meq/L (ref 96–112)
CO2: 28 meq/L (ref 19–32)
Calcium: 9.6 mg/dL (ref 8.4–10.5)
Creatinine, Ser: 0.89 mg/dL (ref 0.40–1.20)
GFR: 68.48 mL/min (ref >60.00)
Glucose, Bld: 88 mg/dL (ref 70–99)
Potassium: 4.7 mEq/L (ref 3.5–5.1)
SODIUM: 141 meq/L (ref 135–145)

## 2018-11-04 LAB — TSH: TSH: 0.94 u[IU]/mL (ref 0.35–4.50)

## 2018-11-04 NOTE — Progress Notes (Signed)
   Subjective:    Patient ID: [name], female    DOB: Dec 23, 1955, 62 y.o.   MRN: [id number]  HPI Leg swelling- pt reports she noticed her feet were swelling 3-4 days ago when she wears her sneakers.  Doesn't happen when she wears boots.  Pt reports only occurs when she wears her sneakers.  Pt had bologna, holiday eating.  No SOB, no CP.  Fatigue- pt reports getting enough sleep but feeling completely wiped out.  This is a recurrent problem for her and tends to worsen w/ her depression (which worsens around the holidays)   Review of Systems For ROS see HPI     Objective:   Physical Exam Vitals signs reviewed.  Constitutional:      General: She is not in acute distress.    Appearance: Normal appearance. She is well-developed.  HENT:     Head: Normocephalic and atraumatic.  Eyes:     Conjunctiva/sclera: Conjunctivae normal.     Pupils: Pupils are equal, round, and reactive to light.  Neck:     Musculoskeletal: Normal range of motion and neck supple.     Thyroid: No thyromegaly.  Cardiovascular:     Rate and Rhythm: Normal rate and regular rhythm.     Heart sounds: Normal heart sounds. No murmur.  Pulmonary:     Effort: Pulmonary effort is normal. No respiratory distress.     Breath sounds: Normal breath sounds.  Abdominal:     General: There is no distension.     Palpations: Abdomen is soft.     Tenderness: There is no abdominal tenderness.  Musculoskeletal:     Right lower leg: No edema.     Left lower leg: No edema.  Lymphadenopathy:     Cervical: No cervical adenopathy.  Skin:    General: Skin is warm and dry.  Neurological:     Mental Status: She is alert and oriented to person, place, and time.  Psychiatric:        Behavior: Behavior normal.           Assessment & Plan:  Leg swelling- new per pt report.  No evidence of this today.  Only occurs when she wears sneakers.  No swelling today.  Stressed need to limit salt intake and increase water intake.   Check labs to r/o metabolic cause.  Will follow.  Fatigue- recurrent problem for pt.  Typically mood related.  Will check labs to determine if there's a metabolic cause.

## 2018-11-04 NOTE — Patient Instructions (Signed)
Follow up as needed or as scheduled We'll notify you of your lab results and make any changes if needed LIMIT your salt intake!  Restaurant food, take out, microwave meals, deli meats, etc are all high in salt INCREASE your water intake! Be sure not to tie your shoes too tight REST!  The holidays are exhausting! Call with any questions or concerns Happy New Year!

## 2018-11-14 DIAGNOSIS — E782 Mixed hyperlipidemia: Secondary | ICD-10-CM | POA: Diagnosis not present

## 2018-11-15 DIAGNOSIS — R69 Illness, unspecified: Secondary | ICD-10-CM | POA: Diagnosis not present

## 2018-11-15 LAB — LIPID PANEL
CHOL/HDL RATIO: 2.3 ratio (ref 0.0–4.4)
CHOLESTEROL TOTAL: 163 mg/dL (ref 100–199)
HDL: 71 mg/dL (ref >39)
LDL CALC: 66 mg/dL (ref 0–99)
TRIGLYCERIDES: 132 mg/dL (ref 0–149)
VLDL Cholesterol Cal: 26 mg/dL (ref 5–40)

## 2018-11-21 DIAGNOSIS — R69 Illness, unspecified: Secondary | ICD-10-CM | POA: Diagnosis not present

## 2018-11-22 ENCOUNTER — Ambulatory Visit (INDEPENDENT_AMBULATORY_CARE_PROVIDER_SITE_OTHER): Payer: Medicare HMO | Admitting: Family Medicine

## 2018-11-22 ENCOUNTER — Other Ambulatory Visit: Payer: Self-pay

## 2018-11-22 ENCOUNTER — Encounter: Payer: Self-pay | Admitting: Family Medicine

## 2018-11-22 VITALS — BP 140/82 | HR 68 | Temp 98.8°F | Resp 16 | Ht 68.0 in | Wt 207.0 lb

## 2018-11-22 DIAGNOSIS — R202 Paresthesia of skin: Secondary | ICD-10-CM

## 2018-11-22 NOTE — Progress Notes (Signed)
Subjective  CC:  Chief Complaint  Patient presents with  . Burning feet    Started about 3 days.. No pain or itching.. Denies any new medications or food   Same day acute visit; PCP not available. chart reviewed.   HPI: [name] is a 63 y.o. female who presents to the office today to address the problems listed above in the chief complaint.  63 year old female with well-controlled diabetes, diagnosed about a year ago, presents for burning in the bottom of both her feet for 2 to 3 days.  It is more of a nuisance.  She denies pain.  She denies numbness, tingling, swelling, rash or discoloration.  She is very anxious and is worried that this could be a vascular problem.  She denies claudication.  She denies recent medication changes.  Diabetes is very well controlled with recent A1c of 5.6 Assessment  1. Paresthesia of both feet      Plan   Paresthesias of both feet: Education given.  This could be the development of an early peripheral neuropathy.  Because it is so new and mild, I do not recommend further work-up at this time.  Start multivitamin.  Monitor symptoms for progression or worsening.  Return if they do and will start work-up at that time.  Educated on possible etiologies.  See after visit summary.  Normal exam today  Follow up: Return if symptoms worsen or fail to improve.  01/26/2019  No orders of the defined types were placed in this encounter.  No orders of the defined types were placed in this encounter.     I reviewed the patients updated PMH, FH, and SocHx.    Patient Active Problem List   Diagnosis Date Noted  . Diet-controlled diabetes mellitus ([place]) 02/09/2018  . Statin intolerance 11/04/2017  . Physical exam 08/04/2017  . Family history of early CAD 03/10/2017  . HTN (hypertension) 02/15/2017  . Herniated nucleus pulposus, L5-S1 06/22/2014  . Asthma, moderate persistent 10/09/2013  . Fatigue 12/12/2012  . GERD (gastroesophageal reflux disease)  09/02/2012  . Vitamin D deficiency 09/15/2010  . DEPRESSIVE DISORDER 07/17/2010  . RHINITIS 06/14/2009  . MUSCLE WEAKNESS (GENERALIZED) 01/03/2009  . Hyperlipidemia 10/10/2008  . BIPOLAR AFFECTIVE DISORDER 10/10/2008  . ADD 10/10/2008   No outpatient medications have been marked as taking for the 11/22/18 encounter (Office Visit) with Leamon Arnt, MD.    Allergies: Patient is allergic to nitrofurantoin monohyd macro; statins; sulfa antibiotics; alprazolam; amoxicillin; and prednisone. Family History: Patient family history includes Coronary artery disease in her father; Heart attack (age of onset: 48) in her father; Heart disease in her father; Hyperlipidemia in her brother; Hypertension in her brother; Lung disease in her mother. Social History:  Patient  reports that she has never smoked. She has never used smokeless tobacco. She reports that she does not drink alcohol or use drugs.  Review of Systems: Constitutional: Negative for fever malaise or anorexia Cardiovascular: negative for chest pain Respiratory: negative for SOB or persistent cough Gastrointestinal: negative for abdominal pain  Objective  Vitals: BP 140/82   Pulse 68   Temp 98.8 F (37.1 C) (Oral)   Resp 16   Ht 5\' 8"  (1.727 m)   Wt 207 lb (93.9 kg)   LMP 11/09/2000   SpO2 98%   BMI 31.47 kg/m  General: no acute distress , A&Ox3 HEENT: PEERL, conjunctiva normal, Oropharynx moist,neck is supple Cardiovascular:  RRR without murmur or gallop.  Respiratory:  Good breath sounds  bilaterally, CTAB with normal respiratory effort Skin:  Warm, no rashes Diabetic Foot Exam: Appearance - no lesions, ulcers or calluses Skin - no sigificant pallor or erythema Monofilament testing - sensitive bilaterally in following locations:  Right - Great toe, medial, central, lateral ball and posterior foot intact  Left - Great toe, medial, central, lateral ball and posterior foot intact Pulses - +2 distally  bilaterally       Commons side effects, risks, benefits, and alternatives for medications and treatment plan prescribed today were discussed, and the patient expressed understanding of the given instructions. Patient is instructed to call or message via MyChart if he/she has any questions or concerns regarding our treatment plan. No barriers to understanding were identified. We discussed Red Flag symptoms and signs in detail. Patient expressed understanding regarding what to do in case of urgent or emergency type symptoms.   Medication list was reconciled, printed and provided to the patient in AVS. Patient instructions and summary information was reviewed with the patient as documented in the AVS. This note was prepared with assistance of Dragon voice recognition software. Occasional wrong-word or sound-a-like substitutions may have occurred due to the inherent limitations of voice recognition software

## 2018-11-22 NOTE — Patient Instructions (Signed)
Please follow up if symptoms do not improve or as needed.   This could be due to a neuropathy; but it may improve on its own. If it persists or worsens, return and we will check blood work to start.    Peripheral Neuropathy Peripheral neuropathy is a type of nerve damage. It affects nerves that carry signals between the spinal cord and the arms, legs, and the rest of the body (peripheral nerves). It does not affect nerves in the spinal cord or brain. In peripheral neuropathy, one nerve or a group of nerves may be damaged. Peripheral neuropathy is a broad category that includes many specific nerve disorders, like diabetic neuropathy, hereditary neuropathy, and carpal tunnel syndrome. What are the causes? This condition may be caused by:  Diabetes. This is the most common cause of peripheral neuropathy.  Nerve injury.  Pressure or stress on a nerve that lasts a long time.  Lack (deficiency) of B vitamins. This can result from alcoholism, poor diet, or a restricted diet.  Infections.  Autoimmune diseases, such as rheumatoid arthritis and systemic lupus erythematosus.  Nerve diseases that are passed from parent to child (inherited).  Some medicines, such as cancer medicines (chemotherapy).  Poisonous (toxic) substances, such as lead and mercury.  Too little blood flowing to the legs.  Kidney disease.  Thyroid disease. In some cases, the cause of this condition is not known. What are the signs or symptoms? Symptoms of this condition depend on which of your nerves is damaged. Common symptoms include:  Loss of feeling (numbness) in the feet, hands, or both.  Tingling in the feet, hands, or both.  Burning pain.  Very sensitive skin.  Weakness.  Not being able to move a part of the body (paralysis).  Muscle twitching.  Clumsiness or poor coordination.  Loss of balance.  Not being able to control your bladder.  Feeling dizzy.  Sexual problems. How is this  diagnosed? Diagnosing and finding the cause of peripheral neuropathy can be difficult. Your health care provider will take your medical history and do a physical exam. A neurological exam will also be done. This involves checking things that are affected by your brain, spinal cord, and nerves (nervous system). For example, your health care provider will check your reflexes, how you move, and what you can feel. You may have other tests, such as:  Blood tests.  Electromyogram (EMG) and nerve conduction tests. These tests check nerve function and how well the nerves are controlling the muscles.  Imaging tests, such as CT scans or MRI to rule out other causes of your symptoms.  Removing a small piece of nerve to be examined in a lab (nerve biopsy). This is rare.  Removing and examining a small amount of the fluid that surrounds the brain and spinal cord (lumbar puncture). This is rare. How is this treated? Treatment for this condition may involve:  Treating the underlying cause of the neuropathy, such as diabetes, kidney disease, or vitamin deficiencies.  Stopping medicines that can cause neuropathy, such as chemotherapy.  Medicine to relieve pain. Medicines may include: ? Prescription or over-the-counter pain medicine. ? Antiseizure medicine. ? Antidepressants. ? Pain-relieving patches that are applied to painful areas of skin.  Surgery to relieve pressure on a nerve or to destroy a nerve that is causing pain.  Physical therapy to help improve movement and balance.  Devices to help you move around (assistive devices). Follow these instructions at home: Medicines  Take over-the-counter and prescription medicines only  as told by your health care provider. Do not take any other medicines without first asking your health care provider.  Do not drive or use heavy machinery while taking prescription pain medicine. Lifestyle   Do not use any products that contain nicotine or tobacco,  such as cigarettes and e-cigarettes. Smoking keeps blood from reaching damaged nerves. If you need help quitting, ask your health care provider.  Avoid or limit alcohol. Too much alcohol can cause a vitamin B deficiency, and vitamin B is needed for healthy nerves.  Eat a healthy diet. This includes: ? Eating foods that are high in fiber, such as fresh fruits and vegetables, whole grains, and beans. ? Limiting foods that are high in fat and processed sugars, such as fried or sweet foods. General instructions   If you have diabetes, work closely with your health care provider to keep your blood sugar under control.  If you have numbness in your feet: ? Check every day for signs of injury or infection. Watch for redness, warmth, and swelling. ? Wear padded socks and comfortable shoes. These help protect your feet.  Develop a good support system. Living with peripheral neuropathy can be stressful. Consider talking with a mental health specialist or joining a support group.  Use assistive devices and attend physical therapy as told by your health care provider. This may include using a walker or a cane.  Keep all follow-up visits as told by your health care provider. This is important. Contact a health care provider if:  You have new signs or symptoms of peripheral neuropathy.  You are struggling emotionally from dealing with peripheral neuropathy.  Your pain is not well-controlled. Get help right away if:  You have an injury or infection that is not healing normally.  You develop new weakness in an arm or leg.  You fall frequently. Summary  Peripheral neuropathy is when the nerves in the arms, or legs are damaged, resulting in numbness, weakness, or pain.  There are many causes of peripheral neuropathy, including diabetes, pinched nerves, vitamin deficiencies, autoimmune disease, and hereditary conditions.  Diagnosing and finding the cause of peripheral neuropathy can be  difficult. Your health care provider will take your medical history, do a physical exam, and do tests, including blood tests and nerve function tests.  Treatment involves treating the underlying cause of the neuropathy and taking medicines to help control pain. Physical therapy and assistive devices may also help. This information is not intended to replace advice given to you by your health care provider. Make sure you discuss any questions you have with your health care provider. Document Released: 10/16/2002 Document Revised: 01/04/2017 Document Reviewed: 01/04/2017 Elsevier Interactive Patient Education  2019 [employer].

## 2018-11-28 ENCOUNTER — Encounter: Payer: Medicare HMO | Attending: Family Medicine | Admitting: Dietician

## 2018-11-28 ENCOUNTER — Encounter: Payer: Self-pay | Admitting: Dietician

## 2018-11-28 DIAGNOSIS — E119 Type 2 diabetes mellitus without complications: Secondary | ICD-10-CM | POA: Diagnosis not present

## 2018-11-28 NOTE — Progress Notes (Signed)
Diabetes Self-Management Education  Visit Type:  Follow-up  Appt. Start Time: 0900 Appt. End Time: 0935  11/28/2018  [name], identified by name and date of birth, is a 63 y.o. female with a diagnosis of Diabetes:   Type 2 diabetes  ASSESSMENT Other history includes bipolar disorder, HTN, HLD (she is followed by a lipid specialist), OSA on c-pap, constipation, GERD.  She states that she is sleeping well and is concerned today because she has gained weight over the holidays.  States that she was eating a lot more foods that were higher in fat and sugar and also snacking more at night. Labs noted to include GFR 68 which has decreased.  Lipids WNL She states that she has had increased stress and anxiety and sees a therapist for this.  Weight hx: 231 lbs January 2019 193 lbs 06/2018 207 lbs 11/22/2018  Patient lives alone.  She worked for [employer] until they downsized.  She is on disability.  She has a pet sitting business and volunteers as well.  She just joined a gym ([employer] [facility] [employer]) and plans on going 2 times per week.  She states that she tends to overdue it when she goes.  Encouraged consistency.  Diabetes Self-Management Education - 11/28/18 0945      Psychosocial Assessment   Patient Belief/Attitude about Diabetes  Motivated to manage diabetes    Patient Concerns  Nutrition/Meal planning;Weight Control    Special Needs  None    Preferred Learning Style  No preference indicated    Learning Readiness  Ready      Pre-Education Assessment   Patient understands the diabetes disease and treatment process.  Demonstrates understanding / competency    Patient understands incorporating nutritional management into lifestyle.  Needs Review    Patient undertands incorporating physical activity into lifestyle.  Demonstrates understanding / competency    Patient understands using medications safely.  Demonstrates understanding / competency    Patient understands monitoring blood  glucose, interpreting and using results  Demonstrates understanding / competency    Patient understands prevention, detection, and treatment of acute complications.  Demonstrates understanding / competency    Patient understands prevention, detection, and treatment of chronic complications.  Demonstrates understanding / competency    Patient understands how to develop strategies to address psychosocial issues.  Needs Review    Patient understands how to develop strategies to promote health/change behavior.  Needs Review      Complications   Last HgB A1C per patient/outside source  5.6 %   08/2018     Dietary Intake   Breakfast  regular oatmeal, plain yogurt, oat bran, blueberries or peaches   8   Snack (morning)  none    Lunch  grilled chicken salad with Balsamic dressing OR chicken, pinto beans, brown rice   2   Snack (afternoon)  none    Dinner  cottage cheese and fruit    Snack (evening)  pretzels    Beverage(s)  water, zero gatorade      Exercise   Exercise Type  Light (walking / raking leaves)    How many days per week to you exercise?  2    How many minutes per day do you exercise?  30    Total minutes per week of exercise  60      Patient Education   Previous Diabetes Education  Yes (please comment)   06/2018   Nutrition management   Information on hints to eating out and maintain  blood glucose control.;Meal options for control of blood glucose level and chronic complications.    Physical activity and exercise   Other (comment)   encouraged a consistent exercise plan most days   Psychosocial adjustment  Worked with patient to identify barriers to care and solutions      Individualized Goals (developed by patient)   Nutrition  General guidelines for healthy choices and portions discussed    Physical Activity  Exercise 5-7 days per week;30 minutes per day    Reducing Risk  increase portions of healthy fats    Health Coping  discuss diabetes with (comment)   MD, RD, CDE      Patient Self-Evaluation of Goals - Patient rates self as meeting previously set goals (% of time)   Nutrition  50 - 75 %    Physical Activity  25 - 50%    Medications  Not Applicable    Monitoring  Not Applicable    Problem Solving  50 - 75 %    Reducing Risk  50 - 75 %    Health Coping  >75%      Post-Education Assessment   Patient understands the diabetes disease and treatment process.  Demonstrates understanding / competency    Patient understands incorporating nutritional management into lifestyle.  Demonstrates understanding / competency    Patient undertands incorporating physical activity into lifestyle.  Demonstrates understanding / competency    Patient understands using medications safely.  Demonstrates understanding / competency    Patient understands monitoring blood glucose, interpreting and using results  Demonstrates understanding / competency    Patient understands prevention, detection, and treatment of acute complications.  Demonstrates understanding / competency    Patient understands prevention, detection, and treatment of chronic complications.  Demonstrates understanding / competency    Patient understands how to develop strategies to address psychosocial issues.  Needs Review    Patient understands how to develop strategies to promote health/change behavior.  Needs Review      Outcomes   Program Status  Completed      Subsequent Visit   Since your last visit have you experienced any weight changes?  Gain    Weight Gain (lbs)  14       Learning Objective:  Patient will have a greater understanding of diabetes self-management. Patient education plan is to attend individual and/or group sessions per assessed needs and concerns.   Plan:   Patient Instructions  Routine/Structure is very important. Aim for some form of exercise every day. Consider the gym 3 times per week.  Consider a class.  Continue Breakfast, Lunch, and a light dinner daily. Light snack  if you are hungry. Meat portion the size of a deck of cards Half your plate should be vegetables. Continue to stay hydrated Avoid buying chips  Continue the MVI Vitamin D 2,000 units daily    Expected Outcomes:  Demonstrated interest in learning. Expect positive outcomes  Education material provided: Snack sheet  If problems or questions, patient to contact team via:  Phone  Future DSME appointment: - 2 months

## 2018-11-28 NOTE — Patient Instructions (Signed)
Routine/Structure is very important. Aim for some form of exercise every day. Consider the gym 3 times per week.  Consider a class.  Continue Breakfast, Lunch, and a light dinner daily. Light snack if you are hungry. Meat portion the size of a deck of cards Half your plate should be vegetables. Continue to stay hydrated Avoid buying chips  Continue the MVI Vitamin D 2,000 units daily

## 2018-11-30 ENCOUNTER — Other Ambulatory Visit: Payer: Self-pay | Admitting: Family Medicine

## 2018-11-30 MED ORDER — BECLOMETHASONE DIPROPIONATE 80 MCG/ACT IN AERS: 2.0000 | INHALATION_SPRAY | Freq: Two times a day (BID) | RESPIRATORY_TRACT | 3 refills | Status: DC

## 2018-11-30 NOTE — Telephone Encounter (Signed)
Ok to send in.  

## 2018-11-30 NOTE — Telephone Encounter (Signed)
Ok to send Qvar

## 2018-11-30 NOTE — Telephone Encounter (Signed)
See PC note-12/31/17 - patient had not been able to afford the Qvar inhaler. Patient had asked for replacement. Patient is requesting Qvar- she states she can afford it now and would like Rx if that would be appropriate.

## 2018-11-30 NOTE — Telephone Encounter (Signed)
Copied from [place] [id number]. Topic: Quick Communication - Rx Refill/Question >> Nov 30, 2018  1:20 PM Scherrie Gerlach wrote: Medication: beclomethasone (QVAR) 80 MCG/ACT inhaler   Pt states she used to could not afford this med, but now she can, and knows she needs as well. Would like to know if Dr Birdie Riddle will send in for her?  She has not had in a while.  [address], [place] [phone] (Phone) [phone] (Fax)

## 2018-12-05 ENCOUNTER — Other Ambulatory Visit: Payer: Self-pay

## 2018-12-05 MED ORDER — EVOLOCUMAB 140 MG/ML ~~LOC~~ SOAJ: 140.0000 mg | SUBCUTANEOUS | 11 refills | Status: DC

## 2018-12-05 NOTE — Telephone Encounter (Signed)
Called pt to let them know that [employer] had denied it to get it free and to call [id number]

## 2018-12-07 ENCOUNTER — Telehealth: Payer: Self-pay | Admitting: Obstetrics & Gynecology

## 2018-12-07 NOTE — Telephone Encounter (Signed)
Call to patient.  Advised just needs to call [facility] and schedule appointment.  Pt will call and schedule appointment.  Encounter closed.

## 2018-12-07 NOTE — Telephone Encounter (Signed)
Patient is calling requesting to go ahead and get a  mammogram done in February, but does not have aex scheduled until May.

## 2018-12-08 ENCOUNTER — Telehealth: Payer: Self-pay

## 2018-12-08 NOTE — Telephone Encounter (Signed)
Copied from [place] [id number]. Topic: General - Other >> Dec 08, 2018  3:37 PM Mcneil, Ja-Kwan wrote: Reason for CRM: Pt states she can not afford the beclomethasone (QVAR) 80 MCG/ACT inhaler and she needs a Rx for another comparable medication. Pt requests call back. Cb# [phone]

## 2018-12-09 NOTE — Telephone Encounter (Signed)
Pt will need to contact insurance or pharmacy and determine what is on her formulary

## 2018-12-09 NOTE — Telephone Encounter (Signed)
Left detailed message for patient letting her know that we needed her to call her insurance company and find out what they will approve.  Once they have given her this, she can let us know which medication and we will call it in.

## 2018-12-12 DIAGNOSIS — R69 Illness, unspecified: Secondary | ICD-10-CM | POA: Diagnosis not present

## 2018-12-13 ENCOUNTER — Ambulatory Visit (INDEPENDENT_AMBULATORY_CARE_PROVIDER_SITE_OTHER): Payer: Medicare HMO | Admitting: Family Medicine

## 2018-12-13 ENCOUNTER — Ambulatory Visit: Payer: Self-pay

## 2018-12-13 ENCOUNTER — Telehealth: Payer: Self-pay

## 2018-12-13 ENCOUNTER — Encounter: Payer: Self-pay | Admitting: Family Medicine

## 2018-12-13 ENCOUNTER — Other Ambulatory Visit: Payer: Self-pay

## 2018-12-13 VITALS — BP 122/81 | HR 64 | Temp 98.1°F | Resp 16 | Ht 68.0 in | Wt 210.1 lb

## 2018-12-13 DIAGNOSIS — J301 Allergic rhinitis due to pollen: Secondary | ICD-10-CM | POA: Diagnosis not present

## 2018-12-13 DIAGNOSIS — R5383 Other fatigue: Secondary | ICD-10-CM | POA: Diagnosis not present

## 2018-12-13 MED ORDER — FLUTICASONE PROPIONATE 50 MCG/ACT NA SUSP: 2.0000 | Freq: Every day | NASAL | 6 refills | Status: DC

## 2018-12-13 MED ORDER — CETIRIZINE HCL 10 MG PO TABS: 10.0000 mg | ORAL_TABLET | Freq: Every day | ORAL | 11 refills | Status: DC

## 2018-12-13 NOTE — Patient Instructions (Signed)
Follow up as scheduled next month START daily Zyrtec (Cetirizine) to improve drainage and fatigue ADD Flonase- 2 sprays each nostril Drink plenty of fluids Rest when needed Continue to work on healthy diet and regular exercise- this will help you feel better Call with any questions or concerns Happy Valentine's Day!

## 2018-12-13 NOTE — Progress Notes (Signed)
   Subjective:    Patient ID: Waynette Buttery, female    DOB: 1955-11-27, 63 y.o.   MRN: [id number]  HPI Fatigue- pt reports good rest and using CPAP but 'i've got a weakness going on and I just don't feel well'.  sxs started last week.  No fevers.  + HA.  Mild nasal congestion.  Not taking any allergy medication.  Pt feel asleep in a movie theater this weekend.  Pt had 'some depression' last week- which frequently triggers fatigue and sleepiness.   Review of Systems For ROS see HPI     Objective:   Physical Exam Vitals signs reviewed.  Constitutional:      General: She is not in acute distress.    Appearance: She is well-developed.  HENT:     Head: Normocephalic and atraumatic.     Right Ear: Tympanic membrane normal.     Left Ear: Tympanic membrane normal.     Nose: Mucosal edema and rhinorrhea present.     Right Sinus: No maxillary sinus tenderness or frontal sinus tenderness.     Left Sinus: No maxillary sinus tenderness or frontal sinus tenderness.     Mouth/Throat:     Pharynx: Posterior oropharyngeal erythema (w/ PND) present.  Eyes:     Conjunctiva/sclera: Conjunctivae normal.     Pupils: Pupils are equal, round, and reactive to light.  Neck:     Musculoskeletal: Normal range of motion and neck supple.  Cardiovascular:     Rate and Rhythm: Normal rate and regular rhythm.     Heart sounds: Normal heart sounds.  Pulmonary:     Effort: Pulmonary effort is normal. No respiratory distress.     Breath sounds: Normal breath sounds. No wheezing or rales.  Lymphadenopathy:     Cervical: No cervical adenopathy.           Assessment & Plan:

## 2018-12-13 NOTE — Assessment & Plan Note (Signed)
Recurrent problem for pt.  Most notable when her depression flares.  This time, also has component of allergic rhinitis.  Start allergy treatment.  No labs necessary as all were recently WNL.  Pt expressed understanding and is in agreement w/ plan.

## 2018-12-13 NOTE — Telephone Encounter (Signed)
Patient called in with c/o "fatigue, weakness." She says "it started last week and is not getting any better. I'm still going, just feel tired and weak. I asked about other symptoms, she denies. Appointment scheduled for today at 43 with [name], care advice given, patient verbalized understanding.  Reason for Disposition . [1] MILD weakness (i.e., does not interfere with ability to work, go to school, normal activities) AND [2] persists > 1 week  Answer Assessment - Initial Assessment Questions 1. DESCRIPTION: "Describe how you are feeling."     Weakness and fatigue 2. SEVERITY: "How bad is it?"  "Can you stand and walk?"   - MILD - Feels weak or tired, but does not interfere with work, school or normal activities   - [place] to stand and walk; weakness interferes with work, school, or normal activities   - SEVERE - Unable to stand or walk     Mild 3. ONSET:  "When did the weakness begin?"     Last week 4. CAUSE: "What do you think is causing the weakness?"     I don't know, maybe a virus 5. MEDICINES: "Have you recently started a new medicine or had a change in the amount of a medicine?"     No 6. OTHER SYMPTOMS: "Do you have any other symptoms?" (e.g., chest pain, fever, cough, SOB, vomiting, diarrhea, bleeding, other areas of pain)     No 7. PREGNANCY: "Is there any chance you are pregnant?" "When was your last menstrual period?"     No  Protocols used: WEAKNESS (GENERALIZED) AND FATIGUE-A-AH

## 2018-12-13 NOTE — Assessment & Plan Note (Signed)
Ongoing issue for pt.  She has not been taking any medication recently and I suspect that her recent flare is contributing to her fatigue.  Start daily antihistamine and nasal steroid.  Reviewed supportive care and red flags that should prompt return.  Pt expressed understanding and is in agreement w/ plan.

## 2018-12-13 NOTE — Telephone Encounter (Signed)
Called to see if the pt had made any progress getting the mediction free from the manufacturer and if not try the the [employer] and I gave them the phone number

## 2018-12-19 DIAGNOSIS — R69 Illness, unspecified: Secondary | ICD-10-CM | POA: Diagnosis not present

## 2018-12-26 DIAGNOSIS — R69 Illness, unspecified: Secondary | ICD-10-CM | POA: Diagnosis not present

## 2019-01-04 NOTE — Telephone Encounter (Signed)
Pt called stating that they had came by and dropped the denial letter for the low income subsidy so that I can resend that along with the repatha forms to [employer] for pt assistance to try to get the drug free from the manufacturer for the pt and also that they need samples. I told the pt that we can infact give them a sample and they stated that they will come and pick up them tomorrow.

## 2019-01-09 DIAGNOSIS — R69 Illness, unspecified: Secondary | ICD-10-CM | POA: Diagnosis not present

## 2019-01-11 ENCOUNTER — Telehealth: Payer: Self-pay | Admitting: Family Medicine

## 2019-01-11 ENCOUNTER — Encounter: Payer: Self-pay | Admitting: Obstetrics & Gynecology

## 2019-01-11 DIAGNOSIS — Z1231 Encounter for screening mammogram for malignant neoplasm of breast: Secondary | ICD-10-CM | POA: Diagnosis not present

## 2019-01-11 NOTE — Telephone Encounter (Signed)
Copied from [place] [phone]. Topic: Quick Communication - Rx Refill/Question >> Jan 11, 2019  3:16 PM Margot Ables wrote: Medication: beclomethasone (QVAR) 80 MCG/ACT inhaler - not covered by ins (ok to cancel PA on Qvar - pharmacy will cancel on their end) - pt called insurance and Arnuity Ellipta is covered - please advise on sending new RX   Has the patient contacted their pharmacy? yes Preferred Pharmacy (with phone number or street name): [place], [place] [phone] (Phone) [phone] (Fax)

## 2019-01-11 NOTE — Telephone Encounter (Signed)
Prior Auth was submitted on her Qvar- we are waiting on their decision.  If denied, will switch to Arnuity

## 2019-01-12 NOTE — Telephone Encounter (Signed)
Patient called stating that Qvar is too expensive ($240). She is requesting Arnuity Ellipta sent to pharmacy instead. Patient is requesting this be sent as soon as possible. Please advise. Thanks.

## 2019-01-12 NOTE — Telephone Encounter (Signed)
Received PA from patient insurance co of approval for Qvar 80 mcg inhaler Approval # [id number] Approved until 11/09/2019 Patient pharmacy notified of approval.

## 2019-01-12 NOTE — Telephone Encounter (Signed)
PA determination is that Qvar is refused.

## 2019-01-12 NOTE — Telephone Encounter (Signed)
Pt states that her pharmacy stated that the pt could try arnuityelpt inhaler 200 MCG. Her insurance will cover this inhaler.

## 2019-01-13 ENCOUNTER — Other Ambulatory Visit: Payer: Self-pay

## 2019-01-13 ENCOUNTER — Ambulatory Visit (INDEPENDENT_AMBULATORY_CARE_PROVIDER_SITE_OTHER): Payer: Medicare HMO | Admitting: Family Medicine

## 2019-01-13 ENCOUNTER — Encounter: Payer: Self-pay | Admitting: Family Medicine

## 2019-01-13 VITALS — BP 122/78 | HR 65 | Temp 98.5°F | Resp 16 | Ht 68.0 in | Wt 214.0 lb

## 2019-01-13 DIAGNOSIS — J301 Allergic rhinitis due to pollen: Secondary | ICD-10-CM

## 2019-01-13 DIAGNOSIS — J0100 Acute maxillary sinusitis, unspecified: Secondary | ICD-10-CM | POA: Diagnosis not present

## 2019-01-13 MED ORDER — FLUTICASONE FUROATE 100 MCG/ACT IN AEPB: 1.0000 | INHALATION_SPRAY | Freq: Every day | RESPIRATORY_TRACT | 6 refills | Status: DC

## 2019-01-13 MED ORDER — DOXYCYCLINE HYCLATE 100 MG PO TABS: 100.0000 mg | ORAL_TABLET | Freq: Two times a day (BID) | ORAL | 0 refills | Status: AC

## 2019-01-13 NOTE — Addendum Note (Signed)
Addended by: Midge Minium on: 01/13/2019 07:32 AM   Modules accepted: Orders

## 2019-01-13 NOTE — Telephone Encounter (Signed)
Prescription sent to pharmacy.

## 2019-01-13 NOTE — Patient Instructions (Signed)
Please follow up if symptoms do not improve or as needed.    Sinusitis, Adult Sinusitis is soreness and swelling (inflammation) of your sinuses. Sinuses are hollow spaces in the bones around your face. They are located:  Around your eyes.  In the middle of your forehead.  Behind your nose.  In your cheekbones. Your sinuses and nasal passages are lined with a fluid called mucus. Mucus drains out of your sinuses. Swelling can trap mucus in your sinuses. This lets germs (bacteria, virus, or fungus) grow, which leads to infection. Most of the time, this condition is caused by a virus. What are the causes? This condition is caused by:  Allergies.  Asthma.  Germs.  Things that block your nose or sinuses.  Growths in the nose (nasal polyps).  Chemicals or irritants in the air.  Fungus (rare). What increases the risk? You are more likely to develop this condition if:  You have a weak body defense system (immune system).  You do a lot of swimming or diving.  You use nasal sprays too much.  You smoke. What are the signs or symptoms? The main symptoms of this condition are pain and a feeling of pressure around the sinuses. Other symptoms include:  Stuffy nose (congestion).  Runny nose (drainage).  Swelling and warmth in the sinuses.  Headache.  Toothache.  A cough that may get worse at night.  Mucus that collects in the throat or the back of the nose (postnasal drip).  Being unable to smell and taste.  Being very tired (fatigue).  A fever.  Sore throat.  Bad breath. How is this diagnosed? This condition is diagnosed based on:  Your symptoms.  Your medical history.  A physical exam.  Tests to find out if your condition is short-term (acute) or long-term (chronic). Your doctor may: ? Check your nose for growths (polyps). ? Check your sinuses using a tool that has a light (endoscope). ? Check for allergies or germs. ? Do imaging tests, such as an MRI  or CT scan. How is this treated? Treatment for this condition depends on the cause and whether it is short-term or long-term.  If caused by a virus, your symptoms should go away on their own within 10 days. You may be given medicines to relieve symptoms. They include: ? Medicines that shrink swollen tissue in the nose. ? Medicines that treat allergies (antihistamines). ? A spray that treats swelling of the nostrils. ? Rinses that help get rid of thick mucus in your nose (nasal saline washes).  If caused by bacteria, your doctor may wait to see if you will get better without treatment. You may be given antibiotic medicine if you have: ? A very bad infection. ? A weak body defense system.  If caused by growths in the nose, you may need to have surgery. Follow these instructions at home: Medicines  Take, use, or apply over-the-counter and prescription medicines only as told by your doctor. These may include nasal sprays.  If you were prescribed an antibiotic medicine, take it as told by your doctor. Do not stop taking the antibiotic even if you start to feel better. Hydrate and humidify   Drink enough water to keep your pee (urine) pale yellow.  Use a cool mist humidifier to keep the humidity level in your home above 50%.  Breathe in steam for 10-15 minutes, 3-4 times a day, or as told by your doctor. You can do this in the bathroom while a hot  shower is running.  Try not to spend time in cool or dry air. Rest  Rest as much as you can.  Sleep with your head raised (elevated).  Make sure you get enough sleep each night. General instructions   Put a warm, moist washcloth on your face 3-4 times a day, or as often as told by your doctor. This will help with discomfort.  Wash your hands often with soap and water. If there is no soap and water, use hand sanitizer.  Do not smoke. Avoid being around people who are smoking (secondhand smoke).  Keep all follow-up visits as told by  your doctor. This is important. Contact a doctor if:  You have a fever.  Your symptoms get worse.  Your symptoms do not get better within 10 days. Get help right away if:  You have a very bad headache.  You cannot stop throwing up (vomiting).  You have very bad pain or swelling around your face or eyes.  You have trouble seeing.  You feel confused.  Your neck is stiff.  You have trouble breathing. Summary  Sinusitis is swelling of your sinuses. Sinuses are hollow spaces in the bones around your face.  This condition is caused by tissues in your nose that become inflamed or swollen. This traps germs. These can lead to infection.  If you were prescribed an antibiotic medicine, take it as told by your doctor. Do not stop taking it even if you start to feel better.  Keep all follow-up visits as told by your doctor. This is important. This information is not intended to replace advice given to you by your health care provider. Make sure you discuss any questions you have with your health care provider. Document Released: 04/13/2008 Document Revised: 03/28/2018 Document Reviewed: 03/28/2018 Elsevier Interactive Patient Education  2019 [employer].

## 2019-01-13 NOTE — Progress Notes (Signed)
Subjective   CC:  Chief Complaint  Patient presents with  . Headache    Thinks it is sinus, saw [name] 2 weeks ago is not feeling much better.   Same day acute visit; PCP not available. New pt to me. Chart reviewed.   HPI: [name] is a 63 y.o. female who presents to the office today to address the problems listed above in the chief complaint.  Patient reports sinus congestion and pressure with thick drainage, mild nonproductive cough, ear pressure without pain, and malaise and persistent fatigue that was evaluated about a month ago and treated for allergies.  She has intermittent diaphoresis.  Some nausea from the postnasal drainage..  Symptoms have been present for several weeks.Shedenies high fevers, GI symptoms, shortness of breath. Shehas had sinus infections in the past and this feels similar.  Last treated with doxycycline in February of last year.  Patient is a non-smoker.  Her asthma is not active currently  I reviewed the patients updated PMH, FH, and SocHx.    Patient Active Problem List   Diagnosis Date Noted  . Allergic rhinitis due to pollen 12/13/2018  . Diet-controlled diabetes mellitus ([place]) 02/09/2018  . Statin intolerance 11/04/2017  . Physical exam 08/04/2017  . Family history of early CAD 03/10/2017  . HTN (hypertension) 02/15/2017  . Herniated nucleus pulposus, L5-S1 06/22/2014  . Asthma, moderate persistent 10/09/2013  . Fatigue 12/12/2012  . GERD (gastroesophageal reflux disease) 09/02/2012  . Vitamin D deficiency 09/15/2010  . DEPRESSIVE DISORDER 07/17/2010  . RHINITIS 06/14/2009  . MUSCLE WEAKNESS (GENERALIZED) 01/03/2009  . Hyperlipidemia 10/10/2008  . BIPOLAR AFFECTIVE DISORDER 10/10/2008  . ADD 10/10/2008   Current Meds  Medication Sig  . beclomethasone (QVAR) 80 MCG/ACT inhaler Inhale 2 puffs into the lungs 2 (two) times daily. Please call [phone] to schedule your physical.  . BESIVANCE 0.6 % SUSP   . cetirizine (ZYRTEC) 10 MG tablet  Take 1 tablet (10 mg total) by mouth daily.  . Cholecalciferol (VITAMIN D) 2000 units tablet Take 2,000 Units by mouth daily.  . clonazePAM (KLONOPIN) 2 MG tablet 1 mg 4 (four) times daily as needed.   . DUREZOL 0.05 % EMUL   . estradiol (ESTRACE) 1 MG tablet TAKE 1 TABLET BY MOUTH EVERY DAY  . Evolocumab (REPATHA SURECLICK) 786 MG/ML SOAJ Inject 140 mg into the skin every 14 (fourteen) days.  . fenofibrate (TRICOR) 145 MG tablet Take 1 tablet (145 mg total) by mouth daily.  . Ferrous Fumarate (IRON) 18 MG TBCR Take by mouth.  . fluticasone (FLONASE) 50 MCG/ACT nasal spray Place 2 sprays into both nostrils daily.  . Fluticasone Furoate (ARNUITY ELLIPTA) 100 MCG/ACT AEPB Inhale 1 puff into the lungs daily.  Marland Kitchen gabapentin (NEURONTIN) 600 MG tablet Take 600 mg by mouth 4 (four) times daily.   Marland Kitchen lamoTRIgine (LAMICTAL) 200 MG tablet Take 200 mg by mouth 2 (two) times daily.   . meclizine (ANTIVERT) 25 MG tablet Take 1 tablet (25 mg total) by mouth 3 (three) times daily as needed for dizziness.  . metoprolol succinate (TOPROL-XL) 50 MG 24 hr tablet TAKE 1 TABLET BY MOUTH EVERY DAY *WITH OR IMMEDIATELY FOLLOWING A MEAL*  . Multiple Vitamin (MULTIVITAMIN WITH MINERALS) TABS tablet Take 1 tablet by mouth daily.  Marland Kitchen OLANZapine (ZYPREXA) 10 MG tablet Take 10 mg by mouth daily as needed.  Marland Kitchen omega-3 acid ethyl esters (LOVAZA) 1 g capsule Take 2 capsules (2 g total) by mouth 2 (two) times  daily.  Marland Kitchen PROLENSA 0.07 % SOLN   . QUEtiapine (SEROQUEL XR) 400 MG 24 hr tablet Take one tablet every evening at bedtime  . QUEtiapine (SEROQUEL) 50 MG tablet TK 1 T PO TID  . venlafaxine (EFFEXOR-XR) 75 MG 24 hr capsule Take 225 mg by mouth daily.     Review of Systems: Cardiovascular: negative for chest pain Respiratory: negative for SOB or persistent cough Gastrointestinal: negative for abdominal pain Genitourinary: negative for dysuria or gross hematuria  Objective  Vitals: BP 122/78   Pulse 65   Temp 98.5 F  (36.9 C) (Oral)   Resp 16   Ht 5\' 8"  (1.727 m)   Wt 214 lb (97.1 kg)   LMP 11/09/2000   SpO2 97%   BMI 32.54 kg/m  General: no acute distress  Psych:  Alert and oriented, normal mood and affect HEENT:  Normocephalic, atraumatic, TMs with serous effusions or retraction w/o erythema, nasal mucosa is red with purulent drainage, left tender maxillary sinus present, OP mild erythematous w/o eudate, supple neck without LAD Cardiovascular:  RRR without murmur or gallop. no peripheral edema Respiratory:  Good breath sounds bilaterally, CTAB with normal respiratory effort Skin:  Warm, no rashes Neurologic:   Mental status is normal. normal gait  Assessment  1. Acute non-recurrent maxillary sinusitis   2. Allergic rhinitis due to pollen, unspecified seasonality      Plan    Sinusitis: History and exam is most consistent with bacterial sinus infection.  Etiology and prognosis discussed with patient.  Recommend antibiotics as ordered below.  Patient to complete course of antibiotics, use supportive medications like mucolytics and decongestants as needed.  May use Tylenol or Advil if needed.  Symptoms should improve over the next 2 weeks.  Patient will return or call if symptoms persist or worsen.  Follow up: As needed   Commons side effects, risks, benefits, and alternatives for medications and treatment plan prescribed today were discussed, and the patient expressed understanding of the given instructions. Patient is instructed to call or message via MyChart if he/she has any questions or concerns regarding our treatment plan. No barriers to understanding were identified. We discussed Red Flag symptoms and signs in detail. Patient expressed understanding regarding what to do in case of urgent or emergency type symptoms.   Medication list was reconciled, printed and provided to the patient in AVS. Patient instructions and summary information was reviewed with the patient as documented in the  AVS. This note was prepared with assistance of Dragon voice recognition software. Occasional wrong-word or sound-a-like substitutions may have occurred due to the inherent limitations of voice recognition software  No orders of the defined types were placed in this encounter.  Meds ordered this encounter  Medications  . doxycycline (VIBRA-TABS) 100 MG tablet    Sig: Take 1 tablet (100 mg total) by mouth 2 (two) times daily for 7 days.    Dispense:  14 tablet    Refill:  0

## 2019-01-25 ENCOUNTER — Other Ambulatory Visit: Payer: Self-pay | Admitting: Family Medicine

## 2019-01-26 ENCOUNTER — Ambulatory Visit (INDEPENDENT_AMBULATORY_CARE_PROVIDER_SITE_OTHER): Payer: Medicare HMO | Admitting: Family Medicine

## 2019-01-26 ENCOUNTER — Other Ambulatory Visit: Payer: Self-pay

## 2019-01-26 ENCOUNTER — Encounter: Payer: Self-pay | Admitting: Family Medicine

## 2019-01-26 VITALS — BP 123/81 | HR 72 | Temp 98.6°F | Resp 16 | Ht 68.0 in | Wt 216.5 lb

## 2019-01-26 DIAGNOSIS — E119 Type 2 diabetes mellitus without complications: Secondary | ICD-10-CM

## 2019-01-26 DIAGNOSIS — F3132 Bipolar disorder, current episode depressed, moderate: Secondary | ICD-10-CM

## 2019-01-26 DIAGNOSIS — Z0000 Encounter for general adult medical examination without abnormal findings: Secondary | ICD-10-CM

## 2019-01-26 DIAGNOSIS — Z1211 Encounter for screening for malignant neoplasm of colon: Secondary | ICD-10-CM

## 2019-01-26 DIAGNOSIS — R69 Illness, unspecified: Secondary | ICD-10-CM | POA: Diagnosis not present

## 2019-01-26 LAB — CBC WITH DIFFERENTIAL/PLATELET
Basophils Absolute: 0 10*3/uL (ref 0.0–0.1)
Basophils Relative: 0.1 % (ref 0.0–3.0)
Eosinophils Absolute: 0 10*3/uL (ref 0.0–0.7)
Eosinophils Relative: 0.1 % (ref 0.0–5.0)
HCT: 37.2 % (ref 36.0–46.0)
Hemoglobin: 12.7 g/dL (ref 12.0–15.0)
Lymphocytes Relative: 32 % (ref 12.0–46.0)
Lymphs Abs: 1.1 10*3/uL (ref 0.7–4.0)
MCHC: 34.1 g/dL (ref 30.0–36.0)
MCV: 90.8 fl (ref 78.0–100.0)
Monocytes Absolute: 0.3 10*3/uL (ref 0.1–1.0)
Monocytes Relative: 7.7 % (ref 3.0–12.0)
Neutro Abs: 2.1 10*3/uL (ref 1.4–7.7)
Neutrophils Relative %: 60.1 % (ref 43.0–77.0)
Platelets: 234 10*3/uL (ref 150.0–400.0)
RBC: 4.1 Mil/uL (ref 3.87–5.11)
RDW: 13.7 % (ref 11.5–15.5)
WBC: 3.5 10*3/uL — AB (ref 4.0–10.5)

## 2019-01-26 LAB — HEPATIC FUNCTION PANEL
ALT: 13 U/L (ref 0–35)
AST: 11 U/L (ref 0–37)
Albumin: 4.8 g/dL (ref 3.5–5.2)
Alkaline Phosphatase: 21 U/L — ABNORMAL LOW (ref 39–117)
Bilirubin, Direct: 0.1 mg/dL (ref 0.0–0.3)
Total Bilirubin: 0.4 mg/dL (ref 0.2–1.2)
Total Protein: 7 g/dL (ref 6.0–8.3)

## 2019-01-26 LAB — LIPID PANEL
Cholesterol: 216 mg/dL — ABNORMAL HIGH (ref 0–200)
HDL: 66.4 mg/dL (ref >39.00)
NonHDL: 149.48
Total CHOL/HDL Ratio: 3
Triglycerides: 221 mg/dL — ABNORMAL HIGH (ref 0.0–149.0)
VLDL: 44.2 mg/dL — ABNORMAL HIGH (ref 0.0–40.0)

## 2019-01-26 LAB — MICROALBUMIN / CREATININE URINE RATIO
CREATININE, U: 125.7 mg/dL
Microalb Creat Ratio: 0.8 mg/g (ref 0.0–30.0)
Microalb, Ur: 1 mg/dL (ref 0.0–1.9)

## 2019-01-26 LAB — LDL CHOLESTEROL, DIRECT: Direct LDL: 123 mg/dL

## 2019-01-26 LAB — BASIC METABOLIC PANEL
BUN: 19 mg/dL (ref 6–23)
CO2: 26 mEq/L (ref 19–32)
CREATININE: 0.65 mg/dL (ref 0.40–1.20)
Calcium: 9.8 mg/dL (ref 8.4–10.5)
Chloride: 106 mEq/L (ref 96–112)
GFR: 92.53 mL/min (ref >60.00)
Glucose, Bld: 120 mg/dL — ABNORMAL HIGH (ref 70–99)
Potassium: 4.5 mEq/L (ref 3.5–5.1)
Sodium: 140 mEq/L (ref 135–145)

## 2019-01-26 LAB — HEMOGLOBIN A1C: Hgb A1c MFr Bld: 5.8 % (ref 4.6–6.5)

## 2019-01-26 LAB — TSH: TSH: 1.19 u[IU]/mL (ref 0.35–4.50)

## 2019-01-26 NOTE — Assessment & Plan Note (Signed)
Ongoing issue for pt.  UTD on eye exam.  Foot exam done today.  microalbumin ordered.  Encouraged healthy diet and regular exercise.  Check labs.  Adjust tx plan prn.

## 2019-01-26 NOTE — Assessment & Plan Note (Signed)
Pt's PE WNL w/ exception of obesity.  UTD on mammo.  Due for colon cancer screen- pt prefers Cologuard.  Check labs.  Anticipatory guidance provided.

## 2019-01-26 NOTE — Assessment & Plan Note (Signed)
Following w/ psychiatry

## 2019-01-26 NOTE — Progress Notes (Signed)
   Subjective:    Patient ID: [name], female    DOB: [birth year], 63 y.o.   MRN: [id number]  HPI CPE- UTD on immunizations, eye exam, mammo.  Due for pap w/ GYN.  Due for colon cancer screening- pt opts for cologuard.     Review of Systems Patient reports no vision/ hearing changes, adenopathy,fever, weight change,  persistant/recurrent hoarseness , swallowing issues, chest pain, palpitations, edema, persistant/recurrent cough, hemoptysis, dyspnea (rest/exertional/paroxysmal nocturnal), gastrointestinal bleeding (melena, rectal bleeding), abdominal pain, significant heartburn, bowel changes, GU symptoms (dysuria, hematuria, incontinence), Gyn symptoms (abnormal  bleeding, pain),  syncope, focal weakness, memory loss, numbness & tingling, skin/hair/nail changes, abnormal bruising or bleeding, anxiety, or depression.     Objective:   Physical Exam General Appearance:    Alert, cooperative, no distress, appears stated age, obese  Head:    Normocephalic, without obvious abnormality, atraumatic  Eyes:    PERRL, conjunctiva/corneas clear, EOM's intact, fundi    benign, both eyes  Ears:    Normal TM's and external ear canals, both ears  Nose:   Nares normal, septum midline, mucosa normal, no drainage    or sinus tenderness  Throat:   Lips, mucosa, and tongue normal; teeth and gums normal  Neck:   Supple, symmetrical, trachea midline, no adenopathy;    Thyroid: no enlargement/tenderness/nodules  Back:     Symmetric, no curvature, ROM normal, no CVA tenderness  Lungs:     Clear to auscultation bilaterally, respirations unlabored  Chest Wall:    No tenderness or deformity   Heart:    Regular rate and rhythm, S1 and S2 normal, no murmur, rub   or gallop  Breast Exam:    Deferred to GYN  Abdomen:     Soft, non-tender, bowel sounds active all four quadrants,    no masses, no organomegaly  Genitalia:    Deferred to GYN  Rectal:    Extremities:   Extremities normal, atraumatic, no cyanosis or  edema  Pulses:   2+ and symmetric all extremities  Skin:   Skin color, texture, turgor normal, no rashes or lesions  Lymph nodes:   Cervical, supraclavicular, and axillary nodes normal  Neurologic:   CNII-XII intact, normal strength, sensation and reflexes    throughout          Assessment & Plan:

## 2019-01-26 NOTE — Patient Instructions (Addendum)
Follow up in 3-4 months to recheck diabetes We'll notify you of your lab results and make any changes if needed Continue to work on healthy diet and regular exercise- you can do it!! Complete the Cologuard and return as directed in the package Call with any questions or concerns STAY SAFE!!!

## 2019-01-27 ENCOUNTER — Encounter: Payer: Self-pay | Admitting: General Practice

## 2019-01-30 ENCOUNTER — Ambulatory Visit: Payer: Medicare HMO | Admitting: Dietician

## 2019-02-01 DIAGNOSIS — R69 Illness, unspecified: Secondary | ICD-10-CM | POA: Diagnosis not present

## 2019-02-02 ENCOUNTER — Telehealth: Payer: Self-pay | Admitting: Pharmacist

## 2019-02-02 NOTE — Telephone Encounter (Signed)
Samples of REPATHA SURECLICK 140MG  were given to the patient, quantity 32, Lot Number [id number] EXP 08/22

## 2019-02-07 DIAGNOSIS — R69 Illness, unspecified: Secondary | ICD-10-CM | POA: Diagnosis not present

## 2019-02-10 ENCOUNTER — Telehealth: Payer: Self-pay

## 2019-02-10 NOTE — Telephone Encounter (Signed)
Called left msg to let the pt know that even w/ the lis letter [employer] stated to apply for repatha ready first and then go from there also gave the pt my callback number at the office

## 2019-02-16 DIAGNOSIS — R69 Illness, unspecified: Secondary | ICD-10-CM | POA: Diagnosis not present

## 2019-02-22 DIAGNOSIS — R69 Illness, unspecified: Secondary | ICD-10-CM | POA: Diagnosis not present

## 2019-03-01 DIAGNOSIS — R69 Illness, unspecified: Secondary | ICD-10-CM | POA: Diagnosis not present

## 2019-03-03 ENCOUNTER — Ambulatory Visit (INDEPENDENT_AMBULATORY_CARE_PROVIDER_SITE_OTHER): Payer: Medicare HMO | Admitting: Family Medicine

## 2019-03-03 ENCOUNTER — Other Ambulatory Visit: Payer: Self-pay

## 2019-03-03 ENCOUNTER — Encounter: Payer: Self-pay | Admitting: Family Medicine

## 2019-03-03 VITALS — Ht 68.0 in | Wt 214.0 lb

## 2019-03-03 DIAGNOSIS — B9689 Other specified bacterial agents as the cause of diseases classified elsewhere: Secondary | ICD-10-CM

## 2019-03-03 DIAGNOSIS — G4733 Obstructive sleep apnea (adult) (pediatric): Secondary | ICD-10-CM | POA: Diagnosis not present

## 2019-03-03 DIAGNOSIS — J329 Chronic sinusitis, unspecified: Secondary | ICD-10-CM

## 2019-03-03 MED ORDER — DOXYCYCLINE HYCLATE 100 MG PO TABS: 100.0000 mg | ORAL_TABLET | Freq: Two times a day (BID) | ORAL | 0 refills | Status: DC

## 2019-03-03 NOTE — Progress Notes (Signed)
I have discussed the procedure for the virtual visit with the patient who has given consent to proceed with assessment and treatment.   BETHANY DILLARD, CMA     

## 2019-03-03 NOTE — Progress Notes (Signed)
Virtual Visit via Video   I connected with patient on 03/03/19 at 11:20 AM EDT by a video enabled telemedicine application and verified that I am speaking with the correct person using two identifiers.  Location patient: Home Location provider: [facility], Office Persons participating in the virtual visit: Patient, Provider, [place] ([place])  I discussed the limitations of evaluation and management by telemedicine and the availability of in person appointments. The patient expressed understanding and agreed to proceed.  Subjective:   HPI:   URI- 'it feels like a relapse of my sinuses.  i've been fighting it for a couple of weeks'.  Using Flonase, Mucinex, and switching between Zyrtec and Allegra.  Pain in frontal and maxillary sinuses.  Denies fever.  No SOB.  No cough.  No tooth pain.  + PND, nasal congestion.  Pt has multiple medication allergies.  ROS:   See pertinent positives and negatives per HPI.  Patient Active Problem List   Diagnosis Date Noted  . Allergic rhinitis due to pollen 12/13/2018  . Diet-controlled diabetes mellitus ([place]) 02/09/2018  . Statin intolerance 11/04/2017  . Physical exam 08/04/2017  . Family history of early CAD 03/10/2017  . HTN (hypertension) 02/15/2017  . Herniated nucleus pulposus, L5-S1 06/22/2014  . Asthma, moderate persistent 10/09/2013  . Fatigue 12/12/2012  . GERD (gastroesophageal reflux disease) 09/02/2012  . Vitamin D deficiency 09/15/2010  . DEPRESSIVE DISORDER 07/17/2010  . RHINITIS 06/14/2009  . MUSCLE WEAKNESS (GENERALIZED) 01/03/2009  . Hyperlipidemia 10/10/2008  . BIPOLAR AFFECTIVE DISORDER 10/10/2008  . ADD 10/10/2008    Social History   Tobacco Use  . Smoking status: Never Smoker  . Smokeless tobacco: Never Used  Substance Use Topics  . Alcohol use: No    Alcohol/week: 0.0 standard drinks    Comment: Occasional glass of wine/"hardly ever"    Current Outpatient Medications:  .  BESIVANCE 0.6 %  SUSP, , Disp: , Rfl:  .  cetirizine (ZYRTEC) 10 MG tablet, Take 1 tablet (10 mg total) by mouth daily., Disp: 30 tablet, Rfl: 11 .  Cholecalciferol (VITAMIN D) 2000 units tablet, Take 2,000 Units by mouth daily., Disp: , Rfl:  .  clonazePAM (KLONOPIN) 2 MG tablet, 1 mg 4 (four) times daily as needed. , Disp: , Rfl: 0 .  DUREZOL 0.05 % EMUL, , Disp: , Rfl:  .  estradiol (ESTRACE) 1 MG tablet, TAKE 1 TABLET BY MOUTH EVERY DAY, Disp: 90 tablet, Rfl: 3 .  Evolocumab (REPATHA SURECLICK) 443 MG/ML SOAJ, Inject 140 mg into the skin every 14 (fourteen) days., Disp: 2 pen, Rfl: 11 .  fenofibrate (TRICOR) 145 MG tablet, Take 1 tablet (145 mg total) by mouth daily., Disp: 30 tablet, Rfl: 6 .  Ferrous Fumarate (IRON) 18 MG TBCR, Take by mouth., Disp: , Rfl:  .  fluticasone (FLONASE) 50 MCG/ACT nasal spray, Place 2 sprays into both nostrils daily., Disp: 16 g, Rfl: 6 .  Fluticasone Furoate (ARNUITY ELLIPTA) 100 MCG/ACT AEPB, Inhale 1 puff into the lungs daily., Disp: 30 each, Rfl: 6 .  gabapentin (NEURONTIN) 600 MG tablet, Take 600 mg by mouth 4 (four) times daily. , Disp: , Rfl:  .  lamoTRIgine (LAMICTAL) 200 MG tablet, Take 200 mg by mouth 2 (two) times daily. , Disp: , Rfl:  .  meclizine (ANTIVERT) 25 MG tablet, Take 1 tablet (25 mg total) by mouth 3 (three) times daily as needed for dizziness., Disp: 30 tablet, Rfl: 0 .  metoprolol succinate (TOPROL-XL) 50 MG 24 hr  tablet, TAKE 1 TABLET BY MOUTH EVERY DAY *WITH OR IMMEDIATELY FOLLOWING A MEAL*, Disp: 90 tablet, Rfl: 1 .  Multiple Vitamin (MULTIVITAMIN WITH MINERALS) TABS tablet, Take 1 tablet by mouth daily., Disp: , Rfl:  .  OLANZapine (ZYPREXA) 10 MG tablet, Take 10 mg by mouth daily as needed., Disp: , Rfl:  .  omega-3 acid ethyl esters (LOVAZA) 1 g capsule, Take 2 capsules (2 g total) by mouth 2 (two) times daily., Disp: 120 capsule, Rfl: 5 .  QUEtiapine (SEROQUEL XR) 400 MG 24 hr tablet, Take one tablet every evening at bedtime, Disp: , Rfl: 0 .   QUEtiapine (SEROQUEL) 50 MG tablet, TK 1 T PO TID, Disp: , Rfl: 3 .  venlafaxine (EFFEXOR-XR) 75 MG 24 hr capsule, Take 225 mg by mouth daily. , Disp: , Rfl:   Allergies  Allergen Reactions  . Nitrofurantoin Monohyd Macro Nausea Only    Severe headache  . Statins Other (See Comments)    Severe weakness, pain  . Sulfa Antibiotics Nausea And Vomiting  . Alprazolam Anxiety and Other (See Comments)    Hyper   . Amoxicillin Rash  . Prednisone Anxiety    Objective:   Ht 5\' 8"  (1.727 m)   Wt 214 lb (97.1 kg)   LMP 11/09/2000   BMI 32.54 kg/m   AAOx3, NAD NCAT, EOMI No obvious CN deficits Coloring WNL Pt is able to speak clearly, coherently without shortness of breath or increased work of breathing.  Thought process is linear.  Mood is appropriate.   Assessment and Plan:   Sinusitis- pt has hx of these and is familiar w/ the sxs.  The facial pain and HA she is experiencing is consistent w/ infxn.  Start abx.  Reviewed supportive care and red flags that should prompt return.  Pt expressed understanding and is in agreement w/ plan.    Annye Asa, MD 03/03/2019

## 2019-03-13 DIAGNOSIS — R69 Illness, unspecified: Secondary | ICD-10-CM | POA: Diagnosis not present

## 2019-03-16 DIAGNOSIS — R69 Illness, unspecified: Secondary | ICD-10-CM | POA: Diagnosis not present

## 2019-03-17 ENCOUNTER — Ambulatory Visit: Payer: Medicare HMO | Admitting: Obstetrics & Gynecology

## 2019-03-22 DIAGNOSIS — R69 Illness, unspecified: Secondary | ICD-10-CM | POA: Diagnosis not present

## 2019-03-23 ENCOUNTER — Encounter: Payer: Self-pay | Admitting: Family Medicine

## 2019-03-23 ENCOUNTER — Ambulatory Visit (INDEPENDENT_AMBULATORY_CARE_PROVIDER_SITE_OTHER): Payer: Medicare HMO | Admitting: Family Medicine

## 2019-03-23 ENCOUNTER — Other Ambulatory Visit: Payer: Self-pay

## 2019-03-23 VITALS — Temp 98.5°F | Ht 69.0 in | Wt 214.0 lb

## 2019-03-23 DIAGNOSIS — J0140 Acute pansinusitis, unspecified: Secondary | ICD-10-CM

## 2019-03-23 MED ORDER — AZELASTINE HCL 0.1 % NA SOLN: 2.0000 | Freq: Two times a day (BID) | NASAL | 3 refills | Status: DC

## 2019-03-23 NOTE — Progress Notes (Signed)
Virtual Visit via Video   I connected with patient on 03/23/19 at 10:20 AM EDT by a video enabled telemedicine application and verified that I am speaking with the correct person using two identifiers.  Location patient: Home Location provider: Acupuncturist, Office Persons participating in the virtual visit: Patient, Provider, Annetta North (Jess B)  I discussed the limitations of evaluation and management by telemedicine and the availability of in person appointments. The patient expressed understanding and agreed to proceed.  Subjective:   HPI:   URI- 'I keep getting that same thing w/ my sinuses'.  + HA, nasal congestion, 'drip'.  Was feeling 'ok' until she spent a day at the park on Monday.  The next morning work feeling poorly.  Was seen on 4/24 and tx'd w/ Doxy.  Continues to use daily Flonase.  Now taking Allegra.    ROS:   See pertinent positives and negatives per HPI.  Patient Active Problem List   Diagnosis Date Noted  . Allergic rhinitis due to pollen 12/13/2018  . Diet-controlled diabetes mellitus ([place]) 02/09/2018  . Statin intolerance 11/04/2017  . Physical exam 08/04/2017  . Family history of early CAD 03/10/2017  . HTN (hypertension) 02/15/2017  . Herniated nucleus pulposus, L5-S1 06/22/2014  . Asthma, moderate persistent 10/09/2013  . Fatigue 12/12/2012  . GERD (gastroesophageal reflux disease) 09/02/2012  . Vitamin D deficiency 09/15/2010  . DEPRESSIVE DISORDER 07/17/2010  . RHINITIS 06/14/2009  . MUSCLE WEAKNESS (GENERALIZED) 01/03/2009  . Hyperlipidemia 10/10/2008  . BIPOLAR AFFECTIVE DISORDER 10/10/2008  . ADD 10/10/2008    Social History   Tobacco Use  . Smoking status: Never Smoker  . Smokeless tobacco: Never Used  Substance Use Topics  . Alcohol use: No    Alcohol/week: 0.0 standard drinks    Comment: Occasional glass of wine/"hardly ever"    Current Outpatient Medications:  .  BESIVANCE 0.6 % SUSP, , Disp: , Rfl:  .  cetirizine (ZYRTEC)  10 MG tablet, Take 1 tablet (10 mg total) by mouth daily., Disp: 30 tablet, Rfl: 11 .  Cholecalciferol (VITAMIN D) 2000 units tablet, Take 2,000 Units by mouth daily., Disp: , Rfl:  .  clonazePAM (KLONOPIN) 2 MG tablet, 1 mg 4 (four) times daily as needed. , Disp: , Rfl: 0 .  DUREZOL 0.05 % EMUL, , Disp: , Rfl:  .  estradiol (ESTRACE) 1 MG tablet, TAKE 1 TABLET BY MOUTH EVERY DAY, Disp: 90 tablet, Rfl: 3 .  Evolocumab (REPATHA SURECLICK) 403 MG/ML SOAJ, Inject 140 mg into the skin every 14 (fourteen) days., Disp: 2 pen, Rfl: 11 .  fenofibrate (TRICOR) 145 MG tablet, Take 1 tablet (145 mg total) by mouth daily., Disp: 30 tablet, Rfl: 6 .  Ferrous Fumarate (IRON) 18 MG TBCR, Take by mouth., Disp: , Rfl:  .  fluticasone (FLONASE) 50 MCG/ACT nasal spray, Place 2 sprays into both nostrils daily., Disp: 16 g, Rfl: 6 .  Fluticasone Furoate (ARNUITY ELLIPTA) 100 MCG/ACT AEPB, Inhale 1 puff into the lungs daily., Disp: 30 each, Rfl: 6 .  gabapentin (NEURONTIN) 600 MG tablet, Take 600 mg by mouth 4 (four) times daily. , Disp: , Rfl:  .  lamoTRIgine (LAMICTAL) 200 MG tablet, Take 200 mg by mouth 2 (two) times daily. , Disp: , Rfl:  .  meclizine (ANTIVERT) 25 MG tablet, Take 1 tablet (25 mg total) by mouth 3 (three) times daily as needed for dizziness., Disp: 30 tablet, Rfl: 0 .  metoprolol succinate (TOPROL-XL) 50 MG 24 hr tablet, TAKE  1 TABLET BY MOUTH EVERY DAY *WITH OR IMMEDIATELY FOLLOWING A MEAL*, Disp: 90 tablet, Rfl: 1 .  Multiple Vitamin (MULTIVITAMIN WITH MINERALS) TABS tablet, Take 1 tablet by mouth daily., Disp: , Rfl:  .  OLANZapine (ZYPREXA) 10 MG tablet, Take 10 mg by mouth daily as needed., Disp: , Rfl:  .  omega-3 acid ethyl esters (LOVAZA) 1 g capsule, Take 2 capsules (2 g total) by mouth 2 (two) times daily., Disp: 120 capsule, Rfl: 5 .  QUEtiapine (SEROQUEL XR) 400 MG 24 hr tablet, Take one tablet every evening at bedtime, Disp: , Rfl: 0 .  QUEtiapine (SEROQUEL) 50 MG tablet, TK 1 T PO TID,  Disp: , Rfl: 3 .  venlafaxine (EFFEXOR-XR) 75 MG 24 hr capsule, Take 225 mg by mouth daily. , Disp: , Rfl:   Allergies  Allergen Reactions  . Nitrofurantoin Monohyd Macro Nausea Only    Severe headache  . Statins Other (See Comments)    Severe weakness, pain  . Sulfa Antibiotics Nausea And Vomiting  . Alprazolam Anxiety and Other (See Comments)    Hyper   . Amoxicillin Rash  . Prednisone Anxiety    Objective:   Temp 98.5 F (36.9 C) (Oral)   Ht 5\' 9"  (1.753 m)   Wt 214 lb (97.1 kg)   LMP 11/09/2000   BMI 31.60 kg/m  AAOx3, NAD NCAT, EOMI No obvious CN deficits Coloring WNL Pt is able to speak clearly, coherently without shortness of breath or increased work of breathing.  Thought process is linear.  Mood is appropriate.   Assessment and Plan:   Sinusitis- recurrent problem for pt.  No evidence of infxn at this time but definitely inflammation.  Add Astelin to pt's current Flonase.  Continue Allegra.  Discussed sinus rinse after time outside.  If no improvement, will need allergy referral.  Pt expressed understanding and is in agreement w/ plan.   Annye Asa, MD 03/23/2019

## 2019-03-23 NOTE — Progress Notes (Signed)
I have discussed the procedure for the virtual visit with the patient who has given consent to proceed with assessment and treatment.   Jessica L Brodmerkel, CMA     

## 2019-04-05 DIAGNOSIS — R69 Illness, unspecified: Secondary | ICD-10-CM | POA: Diagnosis not present

## 2019-04-12 DIAGNOSIS — R69 Illness, unspecified: Secondary | ICD-10-CM | POA: Diagnosis not present

## 2019-04-25 DIAGNOSIS — H40013 Open angle with borderline findings, low risk, bilateral: Secondary | ICD-10-CM | POA: Diagnosis not present

## 2019-04-26 ENCOUNTER — Other Ambulatory Visit: Payer: Self-pay | Admitting: Family Medicine

## 2019-04-27 DIAGNOSIS — R69 Illness, unspecified: Secondary | ICD-10-CM | POA: Diagnosis not present

## 2019-05-03 ENCOUNTER — Other Ambulatory Visit: Payer: Self-pay | Admitting: Obstetrics & Gynecology

## 2019-05-03 NOTE — Telephone Encounter (Signed)
Medication refill request: Estradiol  Last AEX:  12-31-17 SM  Next AEX: 06-09-2019 Last MMG (if hormonal medication request): 01-11-2019 BIRADS 1 negative  Refill authorized: Today, please advise.   Medication pended for #30, 0RF. Please refill if appropriate.

## 2019-05-10 DIAGNOSIS — R69 Illness, unspecified: Secondary | ICD-10-CM | POA: Diagnosis not present

## 2019-05-18 DIAGNOSIS — R69 Illness, unspecified: Secondary | ICD-10-CM | POA: Diagnosis not present

## 2019-05-22 ENCOUNTER — Other Ambulatory Visit: Payer: Self-pay | Admitting: Podiatry

## 2019-05-22 ENCOUNTER — Ambulatory Visit (INDEPENDENT_AMBULATORY_CARE_PROVIDER_SITE_OTHER): Payer: Medicare HMO

## 2019-05-22 ENCOUNTER — Ambulatory Visit: Payer: Medicare HMO | Admitting: Podiatry

## 2019-05-22 ENCOUNTER — Encounter: Payer: Self-pay | Admitting: Podiatry

## 2019-05-22 ENCOUNTER — Encounter

## 2019-05-22 ENCOUNTER — Other Ambulatory Visit: Payer: Self-pay

## 2019-05-22 VITALS — Temp 97.7°F

## 2019-05-22 DIAGNOSIS — S99921A Unspecified injury of right foot, initial encounter: Secondary | ICD-10-CM | POA: Diagnosis not present

## 2019-05-22 DIAGNOSIS — I739 Peripheral vascular disease, unspecified: Secondary | ICD-10-CM

## 2019-05-22 DIAGNOSIS — S92401A Displaced unspecified fracture of right great toe, initial encounter for closed fracture: Secondary | ICD-10-CM | POA: Diagnosis not present

## 2019-05-22 DIAGNOSIS — M79674 Pain in right toe(s): Secondary | ICD-10-CM

## 2019-05-22 NOTE — Progress Notes (Signed)
Subjective: 63 year old female presents the office for concerns of right big toe pain.  She states that she fell down 2 steps about 2 weeks ago injuring her toe and since that she is had swelling and pain.  She had no recent treatment.  She has no other concerns today. Denies any systemic complaints such as fevers, chills, nausea, vomiting. No acute changes since last appointment, and no other complaints at this time.   Objective: AAO x3, NAD DP/PT pulses palpable bilaterally, CRT less than 3 seconds There is tenderness palpation along the right hallux mostly on the IPJ.  There is mild edema to the area there is no open lesions.  No pain the metatarsal with MPJ range of motion.  No other areas of tenderness. No open lesions or pre-ulcerative lesions.  No pain with calf compression, swelling, warmth, erythema  Assessment: Right hallux fracture  Plan: -All treatment options discussed with the patient including all alternatives, risks, complications.  -X-rays were obtained reviewed there is an intra-articular fracture present at the hallux.  Mild displacement. -Recommended offloading surgical shoe was dispensed today.  Ice elevation.  Limit activity. -Discussed long-term sequela of this fracture. -Patient encouraged to call the office with any questions, concerns, change in symptoms.   Return in about 4 weeks (around 06/19/2019).  Repeat x-rays next appointment  Trula Slade DPM

## 2019-05-25 NOTE — Addendum Note (Signed)
Addended by: Cranford Mon R on: 05/25/2019 01:47 PM   Modules accepted: Orders

## 2019-05-26 ENCOUNTER — Ambulatory Visit: Payer: Medicare HMO | Admitting: Podiatry

## 2019-06-02 DIAGNOSIS — L821 Other seborrheic keratosis: Secondary | ICD-10-CM | POA: Diagnosis not present

## 2019-06-02 DIAGNOSIS — L309 Dermatitis, unspecified: Secondary | ICD-10-CM | POA: Diagnosis not present

## 2019-06-02 DIAGNOSIS — L918 Other hypertrophic disorders of the skin: Secondary | ICD-10-CM | POA: Diagnosis not present

## 2019-06-07 ENCOUNTER — Other Ambulatory Visit: Payer: Self-pay

## 2019-06-09 ENCOUNTER — Ambulatory Visit: Payer: Medicare HMO | Admitting: Obstetrics & Gynecology

## 2019-06-09 ENCOUNTER — Telehealth: Payer: Self-pay | Admitting: Obstetrics & Gynecology

## 2019-06-09 NOTE — Telephone Encounter (Signed)
Patient canceled her aex today due to "upset stomach". She rescheduled to 07/05/19.

## 2019-06-10 ENCOUNTER — Other Ambulatory Visit: Payer: Self-pay | Admitting: Obstetrics & Gynecology

## 2019-06-12 NOTE — Telephone Encounter (Signed)
Medication refill request: estradiol 1mg  tablet Last AEX:  12-31-17 Next AEX: 07-05-2019 Last MMG (if hormonal medication request): 01-11-2019 category b density birads 1:neg Refill authorized: please approve until aex if appropriate

## 2019-06-13 DIAGNOSIS — R69 Illness, unspecified: Secondary | ICD-10-CM | POA: Diagnosis not present

## 2019-06-13 NOTE — Telephone Encounter (Signed)
Patient calling to check on refill request

## 2019-06-14 DIAGNOSIS — M25521 Pain in right elbow: Secondary | ICD-10-CM | POA: Diagnosis not present

## 2019-06-14 NOTE — Telephone Encounter (Signed)
Patient is calling regarding refill request. Patient stated that her last dose was 06/12/2019. Patient stated that she is now experiencing "physical effects such as sweating and nervousness."   Cc: Triage and Dr. Sabra Heck.

## 2019-06-14 NOTE — Telephone Encounter (Signed)
Spoke to patient. Patient aware rx has been sent to Dr Sabra Heck for review.

## 2019-06-14 NOTE — Telephone Encounter (Signed)
Patient aware rx was sent.

## 2019-06-15 DIAGNOSIS — R69 Illness, unspecified: Secondary | ICD-10-CM | POA: Diagnosis not present

## 2019-06-19 ENCOUNTER — Encounter: Payer: Self-pay | Admitting: Podiatry

## 2019-06-19 ENCOUNTER — Ambulatory Visit: Payer: Medicare HMO | Admitting: Podiatry

## 2019-06-19 ENCOUNTER — Other Ambulatory Visit: Payer: Self-pay

## 2019-06-19 ENCOUNTER — Ambulatory Visit (INDEPENDENT_AMBULATORY_CARE_PROVIDER_SITE_OTHER): Payer: Medicare HMO

## 2019-06-19 VITALS — Temp 97.6°F

## 2019-06-19 DIAGNOSIS — S92401D Displaced unspecified fracture of right great toe, subsequent encounter for fracture with routine healing: Secondary | ICD-10-CM | POA: Diagnosis not present

## 2019-06-19 DIAGNOSIS — M79674 Pain in right toe(s): Secondary | ICD-10-CM | POA: Diagnosis not present

## 2019-06-19 NOTE — Progress Notes (Signed)
Subjective: 63 year old female presents with our evaluation of right hallux fracture.  She says overall the toe feels better but she still gets some burning sensation and soreness at times.  She is wearing a surgical shoe. Denies any systemic complaints such as fevers, chills, nausea, vomiting. No acute changes since last appointment, and no other complaints at this time.   Objective: AAO x3, NAD DP/PT pulses palpable bilaterally, CRT less than 3 seconds There is still some minimal tenderness palpation on the right hallux and minimal edema.  The toes in rectus position.  No pain with MPJ are better. No open lesions or pre-ulcerative lesions.  No pain with calf compression, swelling, warmth, erythema  Assessment: Right hallux fracture  Plan: -All treatment options discussed with the patient including all alternatives, risks, complications.  -X-rays were obtained and reviewed with the patient.  Intra-articular fracture present to the IPJ of the hallux with some minimal displacement.  There is mild increased consolidation. This time recommend continue immobilization in a surgical shoe.  As she starts to feel better transition to a sneaker I dispensed a graphite insert for her.  Do ice elevate.  Limit activity for now and gradually increase as she can tolerate.  -Patient encouraged to call the office with any questions, concerns, change in symptoms.   RTC 3 weeks or sooner if needed. Repeat x-rays next appointment   Trula Slade DPM

## 2019-06-26 ENCOUNTER — Other Ambulatory Visit: Payer: Self-pay | Admitting: Interventional Cardiology

## 2019-06-28 DIAGNOSIS — R69 Illness, unspecified: Secondary | ICD-10-CM | POA: Diagnosis not present

## 2019-06-30 DIAGNOSIS — M7021 Olecranon bursitis, right elbow: Secondary | ICD-10-CM | POA: Diagnosis not present

## 2019-07-04 DIAGNOSIS — R69 Illness, unspecified: Secondary | ICD-10-CM | POA: Diagnosis not present

## 2019-07-05 ENCOUNTER — Ambulatory Visit (INDEPENDENT_AMBULATORY_CARE_PROVIDER_SITE_OTHER): Payer: Medicare HMO | Admitting: Obstetrics & Gynecology

## 2019-07-05 ENCOUNTER — Other Ambulatory Visit: Payer: Self-pay

## 2019-07-05 ENCOUNTER — Encounter: Payer: Self-pay | Admitting: Obstetrics & Gynecology

## 2019-07-05 VITALS — BP 138/88 | HR 84 | Temp 97.2°F | Resp 14 | Ht 68.25 in | Wt 221.0 lb

## 2019-07-05 DIAGNOSIS — Z01419 Encounter for gynecological examination (general) (routine) without abnormal findings: Secondary | ICD-10-CM | POA: Diagnosis not present

## 2019-07-05 MED ORDER — ESTRADIOL 1 MG PO TABS: 1.0000 mg | ORAL_TABLET | Freq: Every day | ORAL | 4 refills | Status: DC

## 2019-07-05 NOTE — Progress Notes (Signed)
63 y.o. G0P0000 Single White or Caucasian female here for annual exam.  Slipped off two steps and she broke her right toe.  Had some issues with fluid on her elbow as well.  Had to have fluid removed.  She is wearing a brace on her elbow and special shoe on her right foot.    Has gained a little weight.    Denies vaginal bleeding.    She didn't take 1/2 dosing of estradiol.  Is willing to try this at this time.  Patient's last menstrual period was 11/09/2000.          Sexually active: No.  The current method of family planning is status post hysterectomy.    Exercising: Yes.    walking Smoker:  no  Health Maintenance: Pap:  07/03/11 Neg  History of abnormal Pap:  no MMG:  01/11/19 BIRADS 1 negative/density b Colonoscopy:  12/10/06; patient still has Cologuard.  Plans to do this. BMD:  07/11/2015 TDaP:  2013 Pneumonia vaccine(s): 09/02/12 Shingrix:   11/10/18 and 06/09/19 Hep C testing: 10/22/16 Negative Screening Labs: PCP   reports that she has never smoked. She has never used smokeless tobacco. She reports that she does not drink alcohol or use drugs.  Past Medical History:  Diagnosis Date  . Allergic rhinitis   . Anxiety   . Arthritis    R knee, Left Knee  . Asthma   . Bipolar disorder ([place])   . Cataract   . Complication of anesthesia    had bad anxiety after anesthesia  . Constipation   . Constipation, chronic   . Depression   . Diabetes mellitus without complication ([place])   . Dysrhythmia    Palpations occ, takes Tenormin  . GERD (gastroesophageal reflux disease)   . Heart murmur   . Hyperlipidemia   . Hypertension   . MVP (mitral valve prolapse)   . Urinary tract infection    frequent, see Urololgist 12/16/2010    Past Surgical History:  Procedure Laterality Date  . ABDOMINAL HYSTERECTOMY  2002   no oophorectomy  . BACK SURGERY  2001   L5 - S1  . CATARACT EXTRACTION    . NASAL SINUS SURGERY  01/2010   x2  . TONSILLECTOMY    . TOTAL KNEE ARTHROPLASTY  12/23/2011    Procedure: TOTAL KNEE ARTHROPLASTY;  Surgeon: Ninetta Lights, MD;  Location: [facility];  Service: Orthopedics;  Laterality: Right;  DR MURPHY WANTS 120 MINUTES FOR SURGERY  . TOTAL KNEE ARTHROPLASTY  08/31/2012   left knee  . TOTAL KNEE ARTHROPLASTY  08/31/2012   Procedure: TOTAL KNEE ARTHROPLASTY;  Surgeon: Ninetta Lights, MD;  Location: [facility];  Service: Orthopedics;  Laterality: Left;    Current Outpatient Medications  Medication Sig Dispense Refill  . azelastine (ASTELIN) 0.1 % nasal spray Place 2 sprays into both nostrils 2 (two) times daily. Use in each nostril as directed 30 mL 3  . Cholecalciferol (VITAMIN D) 2000 units tablet Take 2,000 Units by mouth daily.    . clonazePAM (KLONOPIN) 2 MG tablet 1 mg 4 (four) times daily as needed.   0  . estradiol (ESTRACE) 1 MG tablet TAKE ONE TABLET BY MOUTH DAILY 30 tablet 0  . Evolocumab (REPATHA SURECLICK) XX123456 MG/ML SOAJ Inject 140 mg into the skin every 14 (fourteen) days. 2 pen 11  . fenofibrate (TRICOR) 145 MG tablet TAKE ONE TABLET BY MOUTH DAILY 30 tablet 5  . fexofenadine (ALLEGRA) 180 MG tablet Take 180 mg  by mouth daily.    . fluticasone (FLONASE) 50 MCG/ACT nasal spray Place 2 sprays into both nostrils daily. 16 g 6  . Fluticasone Furoate (ARNUITY ELLIPTA) 100 MCG/ACT AEPB Inhale 1 puff into the lungs daily. 30 each 6  . gabapentin (NEURONTIN) 600 MG tablet Take 600 mg by mouth 4 (four) times daily.     Marland Kitchen lamoTRIgine (LAMICTAL) 200 MG tablet Take 200 mg by mouth 2 (two) times daily.     . meclizine (ANTIVERT) 25 MG tablet Take 1 tablet (25 mg total) by mouth 3 (three) times daily as needed for dizziness. 30 tablet 0  . metoprolol succinate (TOPROL-XL) 50 MG 24 hr tablet TAKE 1 TABLET BY MOUTH EVERY DAY *WITH OR IMMEDIATELY FOLLOWING A MEAL* 90 tablet 1  . Multiple Vitamin (MULTIVITAMIN WITH MINERALS) TABS tablet Take 1 tablet by mouth daily.    Marland Kitchen OLANZapine (ZYPREXA) 10 MG tablet Take 10 mg by mouth daily as needed.    Marland Kitchen omega-3 acid  ethyl esters (LOVAZA) 1 g capsule TAKE TWO CAPSULES BY MOUTH TWICE A DAY 120 capsule 0  . QUEtiapine (SEROQUEL XR) 400 MG 24 hr tablet Take one tablet every evening at bedtime  0  . QUEtiapine (SEROQUEL) 50 MG tablet TK 1 T PO TID  3  . venlafaxine (EFFEXOR-XR) 75 MG 24 hr capsule Take 225 mg by mouth daily.      No current facility-administered medications for this visit.     Family History  Problem Relation Age of Onset  . Coronary artery disease Father   . Heart attack Father 24  . Heart disease Father   . Lung disease Mother        Mycobacterium avium intracellulare  . Hypertension Brother   . Hyperlipidemia Brother   . Anesthesia problems Neg Hx     Review of Systems  Constitutional: Negative.   HENT: Negative.   Eyes: Negative.   Respiratory: Negative.   Cardiovascular: Negative.   Gastrointestinal: Negative.   Endocrine: Negative.   Genitourinary: Negative.   Musculoskeletal: Negative.   Skin: Negative.   Allergic/Immunologic: Negative.   Neurological: Negative.   Hematological: Negative.   Psychiatric/Behavioral: Negative.     Exam:   BP 138/88   Pulse 84   Temp (!) 97.2 F (36.2 C)   Resp 14   Ht 5' 8.25" (1.734 m)   Wt 221 lb (100.2 kg)   LMP 11/09/2000   BMI 33.36 kg/m    Height: 5' 8.25" (173.4 cm)  Ht Readings from Last 3 Encounters:  07/05/19 5' 8.25" (1.734 m)  03/23/19 5\' 9"  (1.753 m)  03/03/19 5\' 8"  (1.727 m)    General appearance: alert, cooperative and appears stated age Head: Normocephalic, without obvious abnormality, atraumatic Neck: no adenopathy, supple, symmetrical, trachea midline and thyroid normal to inspection and palpation Lungs: clear to auscultation bilaterally Breasts: normal appearance, no masses or tenderness Heart: regular rate and rhythm Abdomen: soft, non-tender; bowel sounds normal; no masses,  no organomegaly Extremities: extremities normal, atraumatic, no cyanosis or edema Skin: Skin color, texture, turgor normal.  No rashes or lesions Lymph nodes: Cervical, supraclavicular, and axillary nodes normal. No abnormal inguinal nodes palpated Neurologic: Grossly normal   Pelvic: External genitalia:  no lesions              Urethra:  normal appearing urethra with no masses, tenderness or lesions              Bartholins and Skenes: normal  Vagina: normal appearing vagina with normal color and discharge, no lesions              Cervix: absent              Pap taken: No. Bimanual Exam:  Uterus:  uterus absent              Adnexa: no mass, fullness, tenderness               Rectovaginal: Confirms               Anus:  normal sphincter tone, no lesions  Chaperone was present for exam.  A:  Well Woman with normal exam PMP, no HRT H/o TAH due to fibroids H/o hypertension H/o bipolar d/o, followed Vit D deficiency   Hyperlipidemia  P:   Mammogram guidelines reviewed.  pap smear not indicated Estradiol 1.0mg  daily.  #90/4RF.  She is going to take 1/2 tab daily. Lab work done with [name] Cologuard information provided.   BMD up to date return annually or prn

## 2019-07-10 ENCOUNTER — Ambulatory Visit (INDEPENDENT_AMBULATORY_CARE_PROVIDER_SITE_OTHER): Payer: Medicare HMO

## 2019-07-10 ENCOUNTER — Ambulatory Visit: Payer: Medicare HMO | Admitting: Podiatry

## 2019-07-10 ENCOUNTER — Other Ambulatory Visit: Payer: Self-pay

## 2019-07-10 DIAGNOSIS — S92401D Displaced unspecified fracture of right great toe, subsequent encounter for fracture with routine healing: Secondary | ICD-10-CM

## 2019-07-10 NOTE — Progress Notes (Signed)
Subjective: 63 year old female presents the office today for follow-up evaluation of fracture of her right big toe.  She says overall she is doing better and she is back to her regular shoe.  She is some occasional discomfort but overall improving. Denies any systemic complaints such as fevers, chills, nausea, vomiting. No acute changes since last appointment, and no other complaints at this time.   Objective: AAO x3, NAD DP/PT pulses palpable bilaterally, CRT less than 3 seconds Mild tenderness palpation the distal aspect the right hallux is on the IPJ.  Mild edema to the toe but there is no significant erythema.  No open sores.  Toes is rectus.  No open lesions or pre-ulcerative lesions.  No pain with calf compression, swelling, warmth, erythema  Assessment: Right hallux fracture  Plan: -All treatment options discussed with the patient including all alternatives, risks, complications.  -Repeat x-rays were obtained and reviewed.  There is increased consolidation at the fracture site.  Intra-articular fracture present of the distal phalanx into the IPJ. -Discussed with her toe still healing.  She is on her feet a lot she should wear the surgical shoe as she starts to feel better overall she can transition to wearing regular shoe as able.  Continue to ice the area. -Patient encouraged to call the office with any questions, concerns, change in symptoms.   Return in about 4 weeks (around 08/07/2019).  Repeat x-rays.  Trula Slade DPM

## 2019-07-19 DIAGNOSIS — R69 Illness, unspecified: Secondary | ICD-10-CM | POA: Diagnosis not present

## 2019-07-21 DIAGNOSIS — M7021 Olecranon bursitis, right elbow: Secondary | ICD-10-CM | POA: Diagnosis not present

## 2019-08-02 DIAGNOSIS — R69 Illness, unspecified: Secondary | ICD-10-CM | POA: Diagnosis not present

## 2019-08-03 ENCOUNTER — Other Ambulatory Visit: Payer: Self-pay | Admitting: Family Medicine

## 2019-08-07 ENCOUNTER — Ambulatory Visit (INDEPENDENT_AMBULATORY_CARE_PROVIDER_SITE_OTHER): Payer: Medicare HMO

## 2019-08-07 ENCOUNTER — Other Ambulatory Visit: Payer: Self-pay

## 2019-08-07 ENCOUNTER — Ambulatory Visit: Payer: Medicare HMO | Admitting: Podiatry

## 2019-08-07 ENCOUNTER — Encounter: Payer: Self-pay | Admitting: Podiatry

## 2019-08-07 ENCOUNTER — Telehealth: Payer: Self-pay

## 2019-08-07 VITALS — BP 148/98

## 2019-08-07 DIAGNOSIS — S92401D Displaced unspecified fracture of right great toe, subsequent encounter for fracture with routine healing: Secondary | ICD-10-CM | POA: Diagnosis not present

## 2019-08-07 NOTE — Telephone Encounter (Signed)
Patient called in stating that she has been having headaches for the past week. BP this morning at her podiatrist appt 148/98. Patient is scheduled to see [name] for AWV on Wednesday. Please advise is separate appt needs to be scheduled or can this be discussed at that visit.

## 2019-08-07 NOTE — Telephone Encounter (Signed)
Can be addressed at same time as [place] if she would like to avoid two trips but please make her aware that there may be a separate OV charge as Medicare does not consider that part of a wellness visit.

## 2019-08-07 NOTE — Telephone Encounter (Signed)
Informed patient that both can be discussed at [facility] and offered to move appt to tomorrow at 1030. Patient agreeable

## 2019-08-07 NOTE — Progress Notes (Signed)
Subjective: 63 year old female presents the office today for follow-up evaluation of fracture of her right big toe.  Overall she states she is doing better.  She still has some soreness but improving.  She is able to wear regular shoe.  Denies any systemic complaints such as fevers, chills, nausea, vomiting. No acute changes since last appointment, and no other complaints at this time.  Blood pressure is elevated today.  She has not contact her primary care physician.  She is under stress recently as her cat passed away recently.  Objective: AAO x3, NAD DP/PT pulses palpable bilaterally, CRT less than 3 seconds Not able to identify any significant tenderness palpation the distal aspect the right hallux is on the IPJ or to other areas of the foot.  Mild edema to the toe still but no erythema or warmth.  No skin breakdown.  Toe is rectus position.  She is moving the IPJ and MPJ without any significant discomfort.  No pain with calf compression, swelling, warmth, erythema  Assessment: Right hallux fracture-healing  Plan: -All treatment options discussed with the patient including all alternatives, risks, complications.  -Repeat x-rays were obtained and reviewed.  Radiolucent line still evident across the fracture site but there is increased consolidation. -On her to continue with a stiffer soled shoe.  Ice to the area as well and gradual increase activity level.  If any increase in pain return to surgical shoe.  Return if symptoms worsen or fail to improve.  If not resolved next month for me know or sooner.  Trula Slade DPM

## 2019-08-08 ENCOUNTER — Encounter: Payer: Self-pay | Admitting: Physician Assistant

## 2019-08-08 ENCOUNTER — Ambulatory Visit (INDEPENDENT_AMBULATORY_CARE_PROVIDER_SITE_OTHER): Payer: Medicare HMO | Admitting: Physician Assistant

## 2019-08-08 VITALS — BP 110/78 | HR 79 | Temp 97.3°F | Resp 16 | Ht 69.0 in | Wt 227.0 lb

## 2019-08-08 DIAGNOSIS — Z23 Encounter for immunization: Secondary | ICD-10-CM

## 2019-08-08 DIAGNOSIS — I10 Essential (primary) hypertension: Secondary | ICD-10-CM

## 2019-08-08 DIAGNOSIS — Z0000 Encounter for general adult medical examination without abnormal findings: Secondary | ICD-10-CM | POA: Diagnosis not present

## 2019-08-08 LAB — CBC WITH DIFFERENTIAL/PLATELET
Basophils Absolute: 0 10*3/uL (ref 0.0–0.1)
Basophils Relative: 0.2 % (ref 0.0–3.0)
Eosinophils Absolute: 0 10*3/uL (ref 0.0–0.7)
Eosinophils Relative: 0 % (ref 0.0–5.0)
HCT: 35.5 % — ABNORMAL LOW (ref 36.0–46.0)
Hemoglobin: 12.3 g/dL (ref 12.0–15.0)
Lymphocytes Relative: 26.9 % (ref 12.0–46.0)
Lymphs Abs: 1.1 10*3/uL (ref 0.7–4.0)
MCHC: 34.7 g/dL (ref 30.0–36.0)
MCV: 90.4 fl (ref 78.0–100.0)
Monocytes Absolute: 0.2 10*3/uL (ref 0.1–1.0)
Monocytes Relative: 5.8 % (ref 3.0–12.0)
Neutro Abs: 2.6 10*3/uL (ref 1.4–7.7)
Neutrophils Relative %: 67.1 % (ref 43.0–77.0)
Platelets: 217 10*3/uL (ref 150.0–400.0)
RBC: 3.92 Mil/uL (ref 3.87–5.11)
RDW: 13.8 % (ref 11.5–15.5)
WBC: 3.9 10*3/uL — ABNORMAL LOW (ref 4.0–10.5)

## 2019-08-08 LAB — BASIC METABOLIC PANEL
BUN: 14 mg/dL (ref 6–23)
CO2: 23 mEq/L (ref 19–32)
Calcium: 9.6 mg/dL (ref 8.4–10.5)
Chloride: 104 mEq/L (ref 96–112)
Creatinine, Ser: 0.55 mg/dL (ref 0.40–1.20)
GFR: 112 mL/min (ref >60.00)
Glucose, Bld: 148 mg/dL — ABNORMAL HIGH (ref 70–99)
Potassium: 4 mEq/L (ref 3.5–5.1)
Sodium: 139 mEq/L (ref 135–145)

## 2019-08-08 LAB — TSH: TSH: 0.96 u[IU]/mL (ref 0.35–4.50)

## 2019-08-08 NOTE — Patient Instructions (Signed)
Please go to the lab today for blood work.  I will call you with your results. We will alter treatment regimen(s) if indicated by your results.   Keep hydrated. Follow dietary recommendations below. I think a lof of your symptoms are stemming from anxiety levels. I want you to discuss with both your counselor and your psychiatrist as they may want to change some things.   Keep check on BP over the next week making sure you have rested for at least 5-10 minutes in a quiet room before checking.   Headache I feel is triggered by your stress levels. Recommend taking an Excedrin Migraine or 2 extra strength tylenol.  Follow-up if not improving with dietary changes and working more on anxiety.    DASH Eating Plan DASH stands for "Dietary Approaches to Stop Hypertension." The DASH eating plan is a healthy eating plan that has been shown to reduce high blood pressure (hypertension). It may also reduce your risk for type 2 diabetes, heart disease, and stroke. The DASH eating plan may also help with weight loss. What are tips for following this plan?  General guidelines  Avoid eating more than 2,300 mg (milligrams) of salt (sodium) a day. If you have hypertension, you may need to reduce your sodium intake to 1,500 mg a day.  Limit alcohol intake to no more than 1 drink a day for nonpregnant women and 2 drinks a day for men. One drink equals 12 oz of beer, 5 oz of wine, or 1 oz of hard liquor.  Work with your health care provider to maintain a healthy body weight or to lose weight. Ask what an ideal weight is for you.  Get at least 30 minutes of exercise that causes your heart to beat faster (aerobic exercise) most days of the week. Activities may include walking, swimming, or biking.  Work with your health care provider or diet and nutrition specialist (dietitian) to adjust your eating plan to your individual calorie needs. Reading food labels   Check food labels for the amount of sodium per  serving. Choose foods with less than 5 percent of the Daily Value of sodium. Generally, foods with less than 300 mg of sodium per serving fit into this eating plan.  To find whole grains, look for the word "whole" as the first word in the ingredient list. Shopping  Buy products labeled as "low-sodium" or "no salt added."  Buy fresh foods. Avoid canned foods and premade or frozen meals. Cooking  Avoid adding salt when cooking. Use salt-free seasonings or herbs instead of table salt or sea salt. Check with your health care provider or pharmacist before using salt substitutes.  Do not fry foods. Cook foods using healthy methods such as baking, boiling, grilling, and broiling instead.  Cook with heart-healthy oils, such as olive, canola, soybean, or sunflower oil. Meal planning  Eat a balanced diet that includes: ? 5 or more servings of fruits and vegetables each day. At each meal, try to fill half of your plate with fruits and vegetables. ? Up to 6-8 servings of whole grains each day. ? Less than 6 oz of lean meat, poultry, or fish each day. A 3-oz serving of meat is about the same size as a deck of cards. One egg equals 1 oz. ? 2 servings of low-fat dairy each day. ? A serving of nuts, seeds, or beans 5 times each week. ? Heart-healthy fats. Healthy fats called Omega-3 fatty acids are found in foods such as  flaxseeds and coldwater fish, like sardines, salmon, and mackerel.  Limit how much you eat of the following: ? Canned or prepackaged foods. ? Food that is high in trans fat, such as fried foods. ? Food that is high in saturated fat, such as fatty meat. ? Sweets, desserts, sugary drinks, and other foods with added sugar. ? Full-fat dairy products.  Do not salt foods before eating.  Try to eat at least 2 vegetarian meals each week.  Eat more home-cooked food and less restaurant, buffet, and fast food.  When eating at a restaurant, ask that your food be prepared with less salt or  no salt, if possible. What foods are recommended? The items listed may not be a complete list. Talk with your dietitian about what dietary choices are best for you. Grains Whole-grain or whole-wheat bread. Whole-grain or whole-wheat pasta. Brown rice. [place]. Bulgur. Whole-grain and low-sodium cereals. Pita bread. Low-fat, low-sodium crackers. Whole-wheat flour tortillas. Vegetables Fresh or frozen vegetables (raw, steamed, roasted, or grilled). Low-sodium or reduced-sodium tomato and vegetable juice. Low-sodium or reduced-sodium tomato sauce and tomato paste. Low-sodium or reduced-sodium canned vegetables. Fruits All fresh, dried, or frozen fruit. Canned fruit in natural juice (without added sugar). Meat and other protein foods Skinless chicken or [place]. Ground chicken or [place]. Pork with fat trimmed off. Fish and seafood. Egg whites. Dried beans, peas, or lentils. Unsalted nuts, nut butters, and seeds. Unsalted canned beans. Lean cuts of beef with fat trimmed off. Low-sodium, lean deli meat. Dairy Low-fat (1%) or fat-free (skim) milk. Fat-free, low-fat, or reduced-fat cheeses. Nonfat, low-sodium ricotta or cottage cheese. Low-fat or nonfat yogurt. Low-fat, low-sodium cheese. Fats and oils Soft margarine without trans fats. Vegetable oil. Low-fat, reduced-fat, or light mayonnaise and salad dressings (reduced-sodium). Canola, safflower, olive, soybean, and sunflower oils. Avocado. Seasoning and other foods Herbs. Spices. Seasoning mixes without salt. Unsalted popcorn and pretzels. Fat-free sweets. What foods are not recommended? The items listed may not be a complete list. Talk with your dietitian about what dietary choices are best for you. Grains Baked goods made with fat, such as croissants, muffins, or some breads. Dry pasta or rice meal packs. Vegetables Creamed or fried vegetables. Vegetables in a cheese sauce. Regular canned vegetables (not low-sodium or reduced-sodium).  Regular canned tomato sauce and paste (not low-sodium or reduced-sodium). Regular tomato and vegetable juice (not low-sodium or reduced-sodium). [name]. Olives. Fruits Canned fruit in a light or heavy syrup. Fried fruit. Fruit in cream or butter sauce. Meat and other protein foods Fatty cuts of meat. Ribs. Fried meat. Berniece Salines. Sausage. Bologna and other processed lunch meats. Salami. Fatback. Hotdogs. Bratwurst. Salted nuts and seeds. Canned beans with added salt. Canned or smoked fish. Whole eggs or egg yolks. Chicken or [place] with skin. Dairy Whole or 2% milk, cream, and half-and-half. Whole or full-fat cream cheese. Whole-fat or sweetened yogurt. Full-fat cheese. Nondairy creamers. Whipped toppings. Processed cheese and cheese spreads. Fats and oils Butter. Stick margarine. Lard. Shortening. Ghee. Bacon fat. Tropical oils, such as coconut, palm kernel, or palm oil. Seasoning and other foods Salted popcorn and pretzels. Onion salt, garlic salt, seasoned salt, table salt, and sea salt. Worcestershire sauce. Tartar sauce. Barbecue sauce. Teriyaki sauce. Soy sauce, including reduced-sodium. Steak sauce. Canned and packaged gravies. Fish sauce. Oyster sauce. Cocktail sauce. Horseradish that you find on the shelf. Ketchup. Mustard. Meat flavorings and tenderizers. Bouillon cubes. Hot sauce and Tabasco sauce. Premade or packaged marinades. Premade or packaged taco seasonings. Relishes. Regular salad dressings. Where  to find more information:  National Heart, Lung, and [place]: https://wilson-eaton.com/  American Heart Association: www.heart.org Summary  The DASH eating plan is a healthy eating plan that has been shown to reduce high blood pressure (hypertension). It may also reduce your risk for type 2 diabetes, heart disease, and stroke.  With the DASH eating plan, you should limit salt (sodium) intake to 2,300 mg a day. If you have hypertension, you may need to reduce your sodium intake to 1,500 mg  a day.  When on the DASH eating plan, aim to eat more fresh fruits and vegetables, whole grains, lean proteins, low-fat dairy, and heart-healthy fats.  Work with your health care provider or diet and nutrition specialist (dietitian) to adjust your eating plan to your individual calorie needs. This information is not intended to replace advice given to you by your health care provider. Make sure you discuss any questions you have with your health care provider. Document Released: 10/15/2011 Document Revised: 10/08/2017 Document Reviewed: 10/19/2016 Elsevier Patient Education  2020 [employer].

## 2019-08-08 NOTE — Progress Notes (Signed)
Subjective:   [name] is a 63 y.o. female who presents for Medicare Annual (Subsequent) preventive examination.  Patient with history of Hypertension, Bipolar Affective Disorder, ADD. Endorses elevated BP over the past couple of weeks. Notes BP elevated to 150s/90s on checks at home. Notes headaches. Denies chest pain, racing heart, LH. Is trying to keep a low salt diet since noting elevated BP measures. Notes some increased stressors recently affecting anxiety levels -- social isolation due to COVID, cat recently died. Is followed by Psychiatry currently on a regimen of Toprol XL 50 mg QD, Gabapentin 600 mg QID, Lamictal 200 mg BID, Olanzapine 10 mg PRN, Seroquel 400 QHS, Effexor 225 mg QD. Sees Psychiatrist every 3 months. Is seeing counselor weekly at present as well.     Review of Systems:  Review of Systems  Constitutional: Negative for fever and weight loss.  HENT: Negative for ear discharge, ear pain, hearing loss and tinnitus.   Eyes: Negative for blurred vision, double vision, photophobia and pain.  Respiratory: Negative for cough and shortness of breath.   Cardiovascular: Negative for chest pain and palpitations.  Gastrointestinal: Negative for abdominal pain, blood in stool, constipation, diarrhea, heartburn, melena, nausea and vomiting.  Genitourinary: Negative for dysuria, flank pain, frequency, hematuria and urgency.  Musculoskeletal: Negative for falls.  Neurological: Positive for headaches. Negative for dizziness and loss of consciousness.  Endo/Heme/Allergies: Negative for environmental allergies.  Psychiatric/Behavioral: Negative for depression, hallucinations, substance abuse and suicidal ideas. The patient is not nervous/anxious and does not have insomnia.    Objective:   Vitals: BP 110/78    Pulse 79    Temp (!) 97.3 F (36.3 C) (Temporal)    Resp 16    Ht 5\' 9"  (1.753 m)    Wt 227 lb (103 kg)    LMP 11/09/2000    SpO2 98%    BMI 33.52 kg/m   Body mass index is  33.52 kg/m.  Advanced Directives 08/08/2019 08/03/2018 09/02/2017 07/28/2017 08/13/2015 11/30/2014 09/13/2014  Does Patient Have a Medical Advance Directive? Yes Yes Yes Yes Yes No;Yes Yes  Type of Paramedic of [place];Living will [place];Living will - Living will;Healthcare Power of [place];Living will [place] will  Does patient want to make changes to medical advance directive? - - - - No - Patient declined No - Patient declined -  Copy of [place] in Chart? No - copy requested No - copy requested - No - copy requested No - copy requested No - copy requested -  Pre-existing out of facility DNR order (yellow form or pink MOST form) - - - - - - -    Tobacco Social History   Tobacco Use  Smoking Status Never Smoker  Smokeless Tobacco Never Used     Counseling given: Yes   Clinical Intake:  Pre-visit preparation completed: No  Pain : 0-10 Pain Type: Acute pain Pain Location: Head     BMI - recorded: 33.52 Nutritional Status: BMI > 30  Obese Diabetes: No     Interpreter Needed?: No     Past Medical History:  Diagnosis Date   Allergic rhinitis    Anxiety    Arthritis    R knee, Left Knee   Asthma    Bipolar disorder ([place])    Cataract    Complication of anesthesia    had bad anxiety after anesthesia   Constipation    Constipation,  chronic    Depression    Diabetes mellitus without complication ([place])    Dysrhythmia    Palpations occ, takes Tenormin   GERD (gastroesophageal reflux disease)    Heart murmur    Hyperlipidemia    Hypertension    MVP (mitral valve prolapse)    Urinary tract infection    frequent, see Urololgist 12/16/2010   Past Surgical History:  Procedure Laterality Date   ABDOMINAL HYSTERECTOMY  2002   no oophorectomy   BACK SURGERY  2001   L5 - S1   CATARACT EXTRACTION     NASAL SINUS SURGERY   01/2010   x2   TONSILLECTOMY     TOTAL KNEE ARTHROPLASTY  12/23/2011   Procedure: TOTAL KNEE ARTHROPLASTY;  Surgeon: Ninetta Lights, MD;  Location: [facility];  Service: Orthopedics;  Laterality: Right;  DR MURPHY WANTS 120 MINUTES FOR SURGERY   TOTAL KNEE ARTHROPLASTY  08/31/2012   left knee   TOTAL KNEE ARTHROPLASTY  08/31/2012   Procedure: TOTAL KNEE ARTHROPLASTY;  Surgeon: Ninetta Lights, MD;  Location: [facility];  Service: Orthopedics;  Laterality: Left;   Family History  Problem Relation Age of Onset   Coronary artery disease Father    Heart attack Father 55   Heart disease Father    Lung disease Mother        Mycobacterium avium intracellulare   Hypertension Brother    Hyperlipidemia Brother    Anesthesia problems Neg Hx    Social History   Socioeconomic History   Marital status: Single    Spouse name: Not on file   Number of children: 0   Years of education: 13   Highest education level: Not on file  Occupational History   Occupation: retired//disability  Scientist, product/process development strain: Not on file   Food insecurity    Worry: Not on file    Inability: Not on file   Transportation needs    Medical: Not on file    Non-medical: Not on file  Tobacco Use   Smoking status: Never Smoker   Smokeless tobacco: Never Used  Substance and Sexual Activity   Alcohol use: No    Alcohol/week: 0.0 standard drinks    Comment: Occasional glass of wine/"hardly ever"   Drug use: No   Sexual activity: Not Currently    Birth control/protection: Abstinence  Lifestyle   Physical activity    Days per week: Not on file    Minutes per session: Not on file   Stress: Not on file  Relationships   Social connections    Talks on phone: Not on file    Gets together: Not on file    Attends religious service: Not on file    Active member of club or organization: Not on file    Attends meetings of clubs or organizations: Not on file    Relationship status:  Not on file  Other Topics Concern   Not on file  Social History Narrative   Lives alone with 2 cats. Has pet sitting business.   2 cups of coffee a day, Rare soda     Outpatient Encounter Medications as of 08/08/2019  Medication Sig   azelastine (ASTELIN) 0.1 % nasal spray Place 2 sprays into both nostrils 2 (two) times daily. Use in each nostril as directed   Cholecalciferol (VITAMIN D) 2000 units tablet Take 2,000 Units by mouth daily.   clonazePAM (KLONOPIN) 2 MG tablet 1 mg 4 (four) times daily as  needed.    estradiol (ESTRACE) 1 MG tablet Take 1 tablet (1 mg total) by mouth daily.   Evolocumab (REPATHA SURECLICK) [id number] MG/ML SOAJ Inject 140 mg into the skin every 14 (fourteen) days.   fenofibrate (TRICOR) 145 MG tablet TAKE ONE TABLET BY MOUTH DAILY   fexofenadine (ALLEGRA) 180 MG tablet Take 180 mg by mouth daily.   fluticasone (FLONASE) 50 MCG/ACT nasal spray Place 2 sprays into both nostrils daily.   Fluticasone Furoate (ARNUITY ELLIPTA) 100 MCG/ACT AEPB Inhale 1 puff into the lungs daily.   gabapentin (NEURONTIN) 600 MG tablet Take 600 mg by mouth 4 (four) times daily.    lamoTRIgine (LAMICTAL) 200 MG tablet Take 200 mg by mouth 2 (two) times daily.    meclizine (ANTIVERT) 25 MG tablet Take 1 tablet (25 mg total) by mouth 3 (three) times daily as needed for dizziness.   metoprolol succinate (TOPROL-XL) 50 MG 24 hr tablet TAKE 1 TABLET BY MOUTH EVERY DAY *WITH OR IMMEDIATELY FOLLOWING A MEAL*   Multiple Vitamin (MULTIVITAMIN WITH MINERALS) TABS tablet Take 1 tablet by mouth daily.   OLANZapine (ZYPREXA) 10 MG tablet Take 10 mg by mouth daily as needed.   omega-3 acid ethyl esters (LOVAZA) 1 g capsule TAKE TWO CAPSULES BY MOUTH TWICE A DAY   QUEtiapine (SEROQUEL XR) 400 MG 24 hr tablet Take one tablet every evening at bedtime   QUEtiapine (SEROQUEL) 50 MG tablet TK 1 T PO TID   venlafaxine (EFFEXOR-XR) 75 MG 24 hr capsule Take 225 mg by mouth daily.    No  facility-administered encounter medications on file as of 08/08/2019.     Activities of Daily Living In your present state of health, do you have any difficulty performing the following activities: 08/08/2019 03/23/2019  Hearing? N N  Vision? N N  Difficulty concentrating or making decisions? N N  Walking or climbing stairs? N N  Dressing or bathing? N N  Doing errands, shopping? N N  Preparing Food and eating ? N -  Using the Toilet? N -  In the past six months, have you accidently leaked urine? N -  Do you have problems with loss of bowel control? N -  Managing your Medications? N -  Managing your Finances? N -  Housekeeping or managing your Housekeeping? N -  Some recent data might be hidden    Patient Care Team: Midge Minium, MD as PCP - General Midge Minium, MD Megan Salon, MD as Consulting Physician (Gynecology) Jettie Booze, MD as Consulting Physician (Cardiology) Leta Baptist, MD as Consulting Physician (Otolaryngology) Brayton Caves, MD as Referring Physician (Psychiatry) Janeth Rase, NP as Nurse Practitioner (Adult Health Nurse Practitioner) Leilani Merl, Hyman Hopes, LCSW as Social Worker (Licensed Clinical Social Worker) Star Age, MD as Attending Physician (Neurology)    Assessment:   This is a routine wellness examination for Kelaiah.   Exercise Activities and Dietary recommendations Current Exercise Habits: Home exercise routine, Type of exercise: walking, Time (Minutes): 30, Frequency (Times/Week): 3, Weekly Exercise (Minutes/Week): 90  Goals     Increase physical activity and eat healthy. (pt-stated)     LDL CALC < 130     LDL-P number goal < 1300 nmol/L      Fall Risk Fall Risk  08/08/2019 03/23/2019 01/26/2019 12/13/2018 11/28/2018  Falls in the past year? 0 0 0 0 0  Number falls in past yr: 0 0 0 0 -  Injury with Fall? 0 0 0 0 -  Follow  up Falls evaluation completed - - - -   Is the patient's home free of loose throw rugs in walkways,  pet beds, electrical cords, etc?   yes      Grab bars in the bathroom? no      Handrails on the stairs?   yes      Adequate lighting?   yes  Depression Screen PHQ 2/9 Scores 03/23/2019 01/26/2019 12/13/2018 11/28/2018  PHQ - 2 Score 0 0 2 2  PHQ- 9 Score - 0 2 -     Cognitive Function MMSE - Mini Mental State Exam 08/08/2019 08/03/2018 08/13/2015  Orientation to time 5 5 5   Orientation to Place 5 5 5   Registration 3 3 3   Attention/ Calculation 5 5 5   Recall 3 2 1   Language- name 2 objects 2 2 2   Language- repeat 1 1 1   Language- follow 3 step command 3 3 3   Language- read & follow direction 1 1 1   Write a sentence 1 1 1   Copy design 1 1 1   Total score 30 29 28         Immunization History  Administered Date(s) Administered   Influenza Split 09/02/2012   Influenza,inj,Quad PF,6+ Mos 10/09/2013, 08/13/2015, 08/17/2016, 07/28/2017, 08/03/2018   Pneumococcal Polysaccharide-23 09/02/2012   Tdap 07/15/2012   Zoster Recombinat (Shingrix) 11/10/2018, 06/09/2019   Qualifies for Shingles Vaccine?Completed  Screening Tests Health Maintenance  Topic Date Due   COLONOSCOPY  12/10/2016   INFLUENZA VACCINE  06/10/2019   OPHTHALMOLOGY EXAM  06/10/2019   HEMOGLOBIN A1C  07/29/2019   HIV Screening  03/22/2020 (Originally 08/13/1971)   FOOT EXAM  01/26/2020   URINE MICROALBUMIN  01/26/2020   MAMMOGRAM  01/10/2021   TETANUS/TDAP  07/15/2022   PNEUMOCOCCAL POLYSACCHARIDE VACCINE AGE 41-64 HIGH RISK  Completed   Hepatitis C Screening  Completed   Cancer Screenings: Lung: Low Dose CT Chest recommended if Age 50-80 years, 30 pack-year currently smoking OR have quit w/in 15years. Patient does not qualify. Breast:  Up to date on Mammogram? Yes   Up to date of Bone Density/Dexa? Yes Colorectal: Overdue Cologuard already ordered. Patient to complete..  Additional Screenings:  Hepatitis C Screening: Completed   Plan:    1. Medicare annual wellness visit, subsequent During the  course of the visit the patient was educated and counseled about appropriate screening and preventive services including: Fall prevention, Bone densitometry screening, Diabetes screening, Nutrition counseling.  Patient UTD on required immunizations. Flu shot updated today.  Patient Instructions (the written plan) was given to the patient.   2. Essential hypertension BP normotensive today. OVS negative. Feels symptoms are mainly anxiety driven which is causing transient elevation in BP. She is to follow-up with her counselor and Psychiatrist for medication management. Start DASH diet. Continue Toprol. Continue BP measurements at home. If still elevated may need to adjust regimen while her psychiatrist is working on anxiety. Trying to avoid hypotension as her BP is normal today. - CBC with Differential/Platelet - TSH - Basic metabolic panel  3. Need for immunization against influenza - Flu Vaccine QUAD 36+ mos IM    I have personally reviewed and noted the following in the patients chart:    Medical and social history  Use of alcohol, tobacco or illicit drugs   Current medications and supplements  Functional ability and status  Nutritional status  Physical activity  Advanced directives  List of other physicians  Hospitalizations, surgeries, and ER visits in previous 12 months  Vitals  Screenings to include cognitive, depression, and falls  Referrals and appointments  In addition, I have reviewed and discussed with patient certain preventive protocols, quality metrics, and best practice recommendations. A written personalized care plan for preventive services as well as general preventive health recommendations were provided to patient.     Leeanne Rio, PA-C  08/08/2019

## 2019-08-09 ENCOUNTER — Ambulatory Visit: Payer: Medicare HMO | Admitting: Physician Assistant

## 2019-08-09 ENCOUNTER — Ambulatory Visit: Payer: Medicare HMO

## 2019-08-09 DIAGNOSIS — R69 Illness, unspecified: Secondary | ICD-10-CM | POA: Diagnosis not present

## 2019-08-14 DIAGNOSIS — R69 Illness, unspecified: Secondary | ICD-10-CM | POA: Diagnosis not present

## 2019-08-23 ENCOUNTER — Encounter (HOSPITAL_COMMUNITY): Payer: Self-pay | Admitting: Obstetrics and Gynecology

## 2019-08-23 ENCOUNTER — Other Ambulatory Visit: Payer: Self-pay

## 2019-08-23 ENCOUNTER — Emergency Department (HOSPITAL_COMMUNITY): Payer: Medicare HMO

## 2019-08-23 ENCOUNTER — Emergency Department (HOSPITAL_COMMUNITY)
Admission: EM | Admit: 2019-08-23 | Discharge: 2019-08-23 | Disposition: A | Discharge status: Home / Self Care | Payer: Medicare HMO | Attending: Emergency Medicine | Admitting: Emergency Medicine

## 2019-08-23 ENCOUNTER — Telehealth: Payer: Self-pay | Admitting: *Deleted

## 2019-08-23 DIAGNOSIS — Y999 Unspecified external cause status: Secondary | ICD-10-CM | POA: Insufficient documentation

## 2019-08-23 DIAGNOSIS — J45909 Unspecified asthma, uncomplicated: Secondary | ICD-10-CM | POA: Insufficient documentation

## 2019-08-23 DIAGNOSIS — E119 Type 2 diabetes mellitus without complications: Secondary | ICD-10-CM | POA: Insufficient documentation

## 2019-08-23 DIAGNOSIS — S299XXA Unspecified injury of thorax, initial encounter: Secondary | ICD-10-CM | POA: Diagnosis not present

## 2019-08-23 DIAGNOSIS — I10 Essential (primary) hypertension: Secondary | ICD-10-CM | POA: Insufficient documentation

## 2019-08-23 DIAGNOSIS — W01198A Fall on same level from slipping, tripping and stumbling with subsequent striking against other object, initial encounter: Secondary | ICD-10-CM | POA: Insufficient documentation

## 2019-08-23 DIAGNOSIS — Z79899 Other long term (current) drug therapy: Secondary | ICD-10-CM | POA: Diagnosis not present

## 2019-08-23 DIAGNOSIS — Y92002 Bathroom of unspecified non-institutional (private) residence single-family (private) house as the place of occurrence of the external cause: Secondary | ICD-10-CM | POA: Insufficient documentation

## 2019-08-23 DIAGNOSIS — R519 Headache, unspecified: Secondary | ICD-10-CM | POA: Diagnosis not present

## 2019-08-23 DIAGNOSIS — M542 Cervicalgia: Secondary | ICD-10-CM | POA: Diagnosis not present

## 2019-08-23 DIAGNOSIS — S199XXA Unspecified injury of neck, initial encounter: Secondary | ICD-10-CM | POA: Diagnosis not present

## 2019-08-23 DIAGNOSIS — S3992XA Unspecified injury of lower back, initial encounter: Secondary | ICD-10-CM | POA: Diagnosis not present

## 2019-08-23 DIAGNOSIS — M5489 Other dorsalgia: Secondary | ICD-10-CM | POA: Diagnosis not present

## 2019-08-23 DIAGNOSIS — S0990XA Unspecified injury of head, initial encounter: Secondary | ICD-10-CM | POA: Diagnosis not present

## 2019-08-23 DIAGNOSIS — M549 Dorsalgia, unspecified: Secondary | ICD-10-CM | POA: Diagnosis not present

## 2019-08-23 DIAGNOSIS — Y9389 Activity, other specified: Secondary | ICD-10-CM | POA: Insufficient documentation

## 2019-08-23 DIAGNOSIS — W19XXXA Unspecified fall, initial encounter: Secondary | ICD-10-CM

## 2019-08-23 MED ORDER — METHOCARBAMOL 500 MG PO TABS: 500.0000 mg | ORAL_TABLET | Freq: Three times a day (TID) | ORAL | 0 refills | Status: DC | PRN

## 2019-08-23 MED ORDER — NAPROXEN 500 MG PO TABS: 500.0000 mg | ORAL_TABLET | Freq: Two times a day (BID) | ORAL | 0 refills | Status: DC

## 2019-08-23 NOTE — Telephone Encounter (Signed)
Patient called in stating that she fell last night in the shower.  She is having some shoulder pain but this morning she is having a "weird sensation" going up through her head. She didn't think she hit her head, but now she is not sure.   She asked if we needed to see her.  Advised that with a fall and now the sensation in her head she needed to be seen in the ER for a more urgent assessment.  At first she advised that she did not want to be in the hospital, but I explained that they had they equipment to be able to make sure there is nothing urgent going on from her fall. Advised of the locations that she could go, and also advised that she have someone drive her. She stated verbal understanding and said she would do that.   Routing to PCP so that she is aware of phone conversation.

## 2019-08-23 NOTE — Discharge Instructions (Addendum)
You were seen in the emergency department today after a fall.  Your imaging did not show any traumatic injuries.  It did show some degenerative changes in your spine which comes with aging as well as a Chiari malformation on your CT of your head which has been present on prior imaging and is likely something you have had for a very long time and not related to trauma.  We are sending home with the following medicines to help with pain/soreness:  - Naproxen is a nonsteroidal anti-inflammatory medication that will help with pain and swelling. Be sure to take this medication as prescribed with food, 1 pill every 12 hours,  It should be taken with food, as it can cause stomach upset, and more seriously, stomach bleeding. Do not take other nonsteroidal anti-inflammatory medications with this such as Advil, Motrin, Aleve, Mobic, Goodie Powder, or Motrin.    - Robaxin is the muscle relaxer I have prescribed, this is meant to help with muscle tightness. Be aware that this medication may make you drowsy therefore the first time you take this it should be at a time you are in an environment where you can rest. Do not drive or operate heavy machinery when taking this medication. Do not drink alcohol or take other sedating medications with this medicine such as narcotics or benzodiazepines.   You make take Tylenol per over the counter dosing with these medications.   We have prescribed you new medication(s) today. Discuss the medications prescribed today with your pharmacist as they can have adverse effects and interactions with your other medicines including over the counter and prescribed medications. Seek medical evaluation if you start to experience new or abnormal symptoms after taking one of these medicines, seek care immediately if you start to experience difficulty breathing, feeling of your throat closing, facial swelling, or rash as these could be indications of a more serious allergic reaction    Please  follow-up with primary care within 1 week.  Return to the ER for new or worsening symptoms including but not limited to worsening pain, numbness, weakness, passing out, vomiting, chest pain, abdominal pain, or any other concerns.

## 2019-08-23 NOTE — ED Provider Notes (Signed)
[facility] DEPT Provider Note   CSN: [id number] Arrival date & time: 08/23/19  1007     History   Chief Complaint Chief Complaint  Patient presents with   Fall    HPI [name] is a 63 y.o. female with a hx of anxiety, bipolar disorder, DM, GERD, HTN, & hyperlipidemia who presents to the ED w/ complaints of head/neck/back pain s/p fall last night.  Patient states she had her foot up on the toilet to look at a skin tag when she miss-stepped/lost balance & fell into the bathtub. She believes she struck her head but did not have LOC. States she has been ambulatory since the fall. Relays pain to the head, neck, & back, worse with movement, no alleviating factors. Called PCP who told her to come to the ED for CT imaging. Denies visual disturbance, vomiting, seizure, numbness, weakness, incontinence, chest pain, or abdominal pain. Denies prodromal sxs prior to fall.       HPI  Past Medical History:  Diagnosis Date   Allergic rhinitis    Anxiety    Arthritis    R knee, Left Knee   Asthma    Bipolar disorder ([place])    Cataract    Complication of anesthesia    had bad anxiety after anesthesia   Constipation    Constipation, chronic    Depression    Diabetes mellitus without complication ([place])    Dysrhythmia    Palpations occ, takes Tenormin   GERD (gastroesophageal reflux disease)    Heart murmur    Hyperlipidemia    Hypertension    MVP (mitral valve prolapse)    Urinary tract infection    frequent, see Urololgist 12/16/2010    Patient Active Problem List   Diagnosis Date Noted   Allergic rhinitis due to pollen 12/13/2018   Diet-controlled diabetes mellitus ([place]) 02/09/2018   Statin intolerance 11/04/2017   Physical exam 08/04/2017   Family history of early CAD 03/10/2017   HTN (hypertension) 02/15/2017   Herniated nucleus pulposus, L5-S1 06/22/2014   Asthma, moderate persistent 10/09/2013   Fatigue  12/12/2012   GERD (gastroesophageal reflux disease) 09/02/2012   Vitamin D deficiency 09/15/2010   DEPRESSIVE DISORDER 07/17/2010   RHINITIS 06/14/2009   MUSCLE WEAKNESS (GENERALIZED) 01/03/2009   Hyperlipidemia 10/10/2008   BIPOLAR AFFECTIVE DISORDER 10/10/2008   ADD 10/10/2008    Past Surgical History:  Procedure Laterality Date   ABDOMINAL HYSTERECTOMY  2002   no oophorectomy   BACK SURGERY  2001   L5 - S1   CATARACT EXTRACTION     NASAL SINUS SURGERY  01/2010   x2   TONSILLECTOMY     TOTAL KNEE ARTHROPLASTY  12/23/2011   Procedure: TOTAL KNEE ARTHROPLASTY;  Surgeon: Ninetta Lights, MD;  Location: [facility];  Service: Orthopedics;  Laterality: Right;  DR MURPHY WANTS 120 MINUTES FOR SURGERY   TOTAL KNEE ARTHROPLASTY  08/31/2012   left knee   TOTAL KNEE ARTHROPLASTY  08/31/2012   Procedure: TOTAL KNEE ARTHROPLASTY;  Surgeon: Ninetta Lights, MD;  Location: [facility];  Service: Orthopedics;  Laterality: Left;     OB History    Gravida  0   Para  0   Term  0   Preterm  0   AB  0   Living  0     SAB  0   TAB  0   Ectopic  0   Multiple  0   Live Births  0  Home Medications    Prior to Admission medications   Medication Sig Start Date End Date Taking? Authorizing Provider  azelastine (ASTELIN) 0.1 % nasal spray Place 2 sprays into both nostrils 2 (two) times daily. Use in each nostril as directed 03/23/19   Midge Minium, MD  Cholecalciferol (VITAMIN D) 2000 units tablet Take 2,000 Units by mouth daily.    [provider]  clonazePAM (KLONOPIN) 2 MG tablet 1 mg 4 (four) times daily as needed.  10/23/15   [provider]  estradiol (ESTRACE) 1 MG tablet Take 1 tablet (1 mg total) by mouth daily. 07/05/19   Megan Salon, MD  Evolocumab (REPATHA SURECLICK) [id number] MG/ML SOAJ Inject 140 mg into the skin every 14 (fourteen) days. 12/05/18   Jettie Booze, MD  fenofibrate (TRICOR) 145 MG tablet TAKE ONE TABLET BY  MOUTH DAILY 04/26/19   Midge Minium, MD  fexofenadine (ALLEGRA) 180 MG tablet Take 180 mg by mouth daily.    [provider]  fluticasone (FLONASE) 50 MCG/ACT nasal spray Place 2 sprays into both nostrils daily. 12/13/18   Midge Minium, MD  Fluticasone Furoate (ARNUITY ELLIPTA) 100 MCG/ACT AEPB Inhale 1 puff into the lungs daily. 01/13/19   Midge Minium, MD  gabapentin (NEURONTIN) 600 MG tablet Take 600 mg by mouth 4 (four) times daily.     [provider]  lamoTRIgine (LAMICTAL) 200 MG tablet Take 200 mg by mouth 2 (two) times daily.     [provider]  meclizine (ANTIVERT) 25 MG tablet Take 1 tablet (25 mg total) by mouth 3 (three) times daily as needed for dizziness. 06/02/18   Leamon Arnt, MD  metoprolol succinate (TOPROL-XL) 50 MG 24 hr tablet TAKE 1 TABLET BY MOUTH EVERY DAY *WITH OR IMMEDIATELY FOLLOWING A MEAL* 08/03/19   Midge Minium, MD  Multiple Vitamin (MULTIVITAMIN WITH MINERALS) TABS tablet Take 1 tablet by mouth daily.    [provider]  OLANZapine (ZYPREXA) 10 MG tablet Take 10 mg by mouth daily as needed.    [provider]  omega-3 acid ethyl esters (LOVAZA) 1 g capsule TAKE TWO CAPSULES BY MOUTH TWICE A DAY 06/27/19   Jettie Booze, MD  QUEtiapine (SEROQUEL XR) 400 MG 24 hr tablet Take one tablet every evening at bedtime 07/27/16   [provider]  QUEtiapine (SEROQUEL) 50 MG tablet TK 1 T PO TID 07/25/16   [provider]  venlafaxine (EFFEXOR-XR) 75 MG 24 hr capsule Take 225 mg by mouth daily.     [provider]    Family History Family History  Problem Relation Age of Onset   Coronary artery disease Father    Heart attack Father 60   Heart disease Father    Lung disease Mother        Mycobacterium avium intracellulare   Hypertension Brother    Hyperlipidemia Brother    Anesthesia problems Neg Hx     Social History Social History   Tobacco Use   Smoking  status: Never Smoker   Smokeless tobacco: Never Used  Substance Use Topics   Alcohol use: No    Alcohol/week: 0.0 standard drinks    Comment: Occasional glass of wine/"hardly ever"   Drug use: No     Allergies   Nitrofurantoin monohyd macro, Statins, Sulfa antibiotics, Alprazolam, Amoxicillin, and Prednisone   Review of Systems Review of Systems  Constitutional: Negative for chills and fever.  Respiratory: Negative for shortness of breath.  Cardiovascular: Negative for chest pain.  Gastrointestinal: Negative for nausea and vomiting.  Musculoskeletal: Positive for back pain and neck pain.  Neurological: Positive for headaches. Negative for dizziness, seizures, syncope, speech difficulty, weakness and numbness.       Negative for incontinence.   All other systems reviewed and are negative.    Physical Exam Updated Vital Signs BP (!) 158/89    Pulse 79    Temp 99.2 F (37.3 C) (Oral)    Resp 16    LMP 11/09/2000    SpO2 96%   Physical Exam Vitals signs and nursing note reviewed.  Constitutional:      General: She is not in acute distress.    Appearance: She is well-developed.  HENT:     Head: Normocephalic and atraumatic. No raccoon eyes or Battle's sign.     Right Ear: No hemotympanum.     Left Ear: No hemotympanum.  Eyes:     General:        Right eye: No discharge.        Left eye: No discharge.     Conjunctiva/sclera: Conjunctivae normal.     Pupils: Pupils are equal, round, and reactive to light.  Neck:     Musculoskeletal: Spinous process tenderness (Diffuse.  No point/focal tenderness or palpable step-off.) and muscular tenderness present.  Cardiovascular:     Rate and Rhythm: Normal rate and regular rhythm.     Heart sounds: No murmur.  Pulmonary:     Effort: No respiratory distress.     Breath sounds: Normal breath sounds. No wheezing or rales.  Chest:     Chest wall: No tenderness.  Abdominal:     General: There is no distension.     Palpations:  Abdomen is soft.     Tenderness: There is no abdominal tenderness. There is no guarding or rebound.  Musculoskeletal:     Comments: No obvious deformity, appreciable swelling, erythema, ecchymosis, open wounds. Upper/lower extremities: Intact active range of motion no point/focal bony tenderness. Back: Patient diffusely tender throughout the thoracic and lumbar region including midline and bilateral paraspinal possibles, no point/focal vertebral tenderness.  No palpable step-off.  Skin:    General: Skin is warm and dry.     Findings: No rash.  Neurological:     Comments: Alert.  Clear speech.  CN III through XII grossly intact.  Sensation grossly intact bilateral upper and lower extremities.  5-5 symmetric grip strength.  5-5 strength with plantar dorsiflexion bilaterally.  Negative pronator drift.  Patient is ambulatory.  Psychiatric:        Behavior: Behavior normal.    ED Treatments / Results  Labs (all labs ordered are listed, but only abnormal results are displayed) Labs Reviewed - No data to display  EKG None  Radiology Dg Thoracic Spine 2 View  Result Date: 08/23/2019 CLINICAL DATA:  Patient reports she fell last night in the bathtub. Patient reports she is unsure if she hit her head and her her shoulders. Patient denies LOC. Patient denies being on blood thinners. EXAM: THORACIC SPINE 2 VIEWS COMPARISON:  None. FINDINGS: No fracture or bone lesion. Minimal curvature of the midthoracic spine to the right. No spondylolisthesis. Minor disc degenerative changes along the mid to lower thoracic spine. Soft tissues are unremarkable. IMPRESSION: No fracture or acute finding. Electronically Signed   By: Lajean Manes M.D.   On: 08/23/2019 13:06   Dg Lumbar Spine Complete  Result Date: 08/23/2019 CLINICAL DATA:  Patient reports she fell  last night in the bathtub. Patient reports she is unsure if she hit her head and her her shoulders. Patient denies LOC. Patient denies being on blood  thinners. EXAM: LUMBAR SPINE - COMPLETE 4+ VIEW COMPARISON:  03/22/2013 FINDINGS: No fracture. Minimal anterolisthesis of L4 on L5. No other spondylolisthesis. Moderate loss of disc height at L2-L3, mild loss of disc height at L4-L5 with moderate loss of disc height at L5-S1. Mild facet degenerative change bilaterally at L4-L5. Mild curvature, convex to the right, apex at L3. Soft tissues are unremarkable. IMPRESSION: 1. No fracture or acute finding. 2. Degenerative changes as described. Electronically Signed   By: Lajean Manes M.D.   On: 08/23/2019 13:08   Ct Head Wo Contrast  Result Date: 08/23/2019 CLINICAL DATA:  Fall in bathtub, neck pain EXAM: CT HEAD WITHOUT CONTRAST CT CERVICAL SPINE WITHOUT CONTRAST TECHNIQUE: Multidetector CT imaging of the head and cervical spine was performed following the standard protocol without intravenous contrast. Multiplanar CT image reconstructions of the cervical spine were also generated. COMPARISON:  Maxillofacial exam of 2014. CT of the head of 08/20/2008. FINDINGS: CT HEAD FINDINGS Brain: No evidence of acute infarction, hemorrhage, hydrocephalus, extra-axial collection or mass lesion/mass effect. Signs of Chiari 1 malformation similar to prior study. Vascular: No hyperdense vessel or unexpected calcification. Skull: No signs of acute bone process or destructive bone lesion. No signs of skull fracture. Sinuses/Orbits: No acute finding. Other: None. CT CERVICAL SPINE FINDINGS Alignment: Mild straightening of normal cervical lordosis. No signs of fracture or subluxation. Skull base and vertebrae: No acute fracture. No primary bone lesion or focal pathologic process. Soft tissues and spinal canal: No prevertebral fluid or swelling. No visible canal hematoma. Disc levels: Mild-to-moderate degenerative changes greatest at C5-C6. Upper chest: Negative. Other: No signs of adenopathy or suspicious finding in the soft tissues. IMPRESSION: 1. Signs of Chiari 1 malformation  similar to prior studies without acute intracranial findings. 2. No signs of acute fracture or subluxation involving the cervical spine. Electronically Signed   By: Zetta Bills M.D.   On: 08/23/2019 13:40   Ct Cervical Spine Wo Contrast  Result Date: 08/23/2019 CLINICAL DATA:  Fall in bathtub, neck pain EXAM: CT HEAD WITHOUT CONTRAST CT CERVICAL SPINE WITHOUT CONTRAST TECHNIQUE: Multidetector CT imaging of the head and cervical spine was performed following the standard protocol without intravenous contrast. Multiplanar CT image reconstructions of the cervical spine were also generated. COMPARISON:  Maxillofacial exam of 2014. CT of the head of 08/20/2008. FINDINGS: CT HEAD FINDINGS Brain: No evidence of acute infarction, hemorrhage, hydrocephalus, extra-axial collection or mass lesion/mass effect. Signs of Chiari 1 malformation similar to prior study. Vascular: No hyperdense vessel or unexpected calcification. Skull: No signs of acute bone process or destructive bone lesion. No signs of skull fracture. Sinuses/Orbits: No acute finding. Other: None. CT CERVICAL SPINE FINDINGS Alignment: Mild straightening of normal cervical lordosis. No signs of fracture or subluxation. Skull base and vertebrae: No acute fracture. No primary bone lesion or focal pathologic process. Soft tissues and spinal canal: No prevertebral fluid or swelling. No visible canal hematoma. Disc levels: Mild-to-moderate degenerative changes greatest at C5-C6. Upper chest: Negative. Other: No signs of adenopathy or suspicious finding in the soft tissues. IMPRESSION: 1. Signs of Chiari 1 malformation similar to prior studies without acute intracranial findings. 2. No signs of acute fracture or subluxation involving the cervical spine. Electronically Signed   By: Zetta Bills M.D.   On: 08/23/2019 13:40    Procedures Procedures (including critical  care time)  Medications Ordered in ED Medications - No data to display   Initial  Impression / Assessment and Plan / ED Course  I have reviewed the triage vital signs and the nursing notes.  Pertinent labs & imaging results that were available during my care of the patient were reviewed by me and considered in my medical decision making (see chart for details).   Patient presents to the ED s/p mechanical fall last night with complaints of head/neck/back pain. Patient nontoxic appearing, resting comfortably, vitals WNL with the exception of elevated BP- doubt HTN emergency. CT head/Cspine & Xrays of L/T spine w/o acute injury, No point/focal vertebral tenderness or palpable step off, no focal neuro deficits- doubt bleed or fx/dislocation. Marland Kitchen No chest/abdominal tenderness to indicate intra-thoracic/abdominal injury. Appears appropriate for discharge home w/ naproxen/robaxin- discussed no driving or operating heavy machinery w/ robaxin, checked renal function- WNL within past year in terms of naproxen use. I discussed results, treatment plan, need for follow-up, and return precautions with the patient. Provided opportunity for questions, patient confirmed understanding and is in agreement with plan.    Final Clinical Impressions(s) / ED Diagnoses   Final diagnoses:  Fall, initial encounter    ED Discharge Orders         Ordered    naproxen (NAPROSYN) 500 MG tablet  2 times daily     08/23/19 1358    methocarbamol (ROBAXIN) 500 MG tablet  Every 8 hours PRN     08/23/19 1358           Doyce Saling, [place] R, PA-C 08/23/19 1400    Lennice Sites, DO 08/23/19 1502

## 2019-08-23 NOTE — ED Triage Notes (Signed)
Patient reports she fell last night in the bathtub. Patient reports she is unsure if she hit her head and her her shoulders. Patient denies LOC. Patient denies being on blood thinners.

## 2019-08-23 NOTE — Telephone Encounter (Signed)
Yes.  Agree w/ ER evaluation after falling in the shower and now having issues w/ her head

## 2019-08-23 NOTE — ED Notes (Signed)
Patient transported to X-ray 

## 2019-08-24 DIAGNOSIS — R69 Illness, unspecified: Secondary | ICD-10-CM | POA: Diagnosis not present

## 2019-08-27 ENCOUNTER — Other Ambulatory Visit: Payer: Self-pay | Admitting: Interventional Cardiology

## 2019-08-29 DIAGNOSIS — H401131 Primary open-angle glaucoma, bilateral, mild stage: Secondary | ICD-10-CM | POA: Diagnosis not present

## 2019-08-30 DIAGNOSIS — R69 Illness, unspecified: Secondary | ICD-10-CM | POA: Diagnosis not present

## 2019-08-31 ENCOUNTER — Telehealth: Payer: Self-pay | Admitting: Pharmacist

## 2019-08-31 MED ORDER — OMEGA-3-ACID ETHYL ESTERS 1 G PO CAPS: 2.0000 | ORAL_CAPSULE | Freq: Two times a day (BID) | ORAL | 0 refills | Status: DC

## 2019-08-31 NOTE — Telephone Encounter (Signed)
Called patient. She has not seen Dr. Irish Lack is several years. He actually told her to just follow up prn. I have provided her with a 30 day supply and advised to have her PCP write next Rx

## 2019-08-31 NOTE — Telephone Encounter (Signed)
Pt called and wanted assistance getting a refill on the Omega-3 supplement. She said Raquel has helped her in the past. She also mentioned that she had the direct number for the pharmacy but misplaced it.

## 2019-09-07 DIAGNOSIS — H04123 Dry eye syndrome of bilateral lacrimal glands: Secondary | ICD-10-CM | POA: Diagnosis not present

## 2019-09-12 DIAGNOSIS — R69 Illness, unspecified: Secondary | ICD-10-CM | POA: Diagnosis not present

## 2019-09-13 DIAGNOSIS — R69 Illness, unspecified: Secondary | ICD-10-CM | POA: Diagnosis not present

## 2019-09-16 ENCOUNTER — Encounter: Payer: Self-pay | Admitting: Family Medicine

## 2019-09-16 ENCOUNTER — Other Ambulatory Visit: Payer: Self-pay

## 2019-09-16 ENCOUNTER — Ambulatory Visit (INDEPENDENT_AMBULATORY_CARE_PROVIDER_SITE_OTHER): Payer: Medicare HMO | Admitting: Family Medicine

## 2019-09-16 VITALS — Temp 98.2°F

## 2019-09-16 DIAGNOSIS — B9689 Other specified bacterial agents as the cause of diseases classified elsewhere: Secondary | ICD-10-CM

## 2019-09-16 DIAGNOSIS — J329 Chronic sinusitis, unspecified: Secondary | ICD-10-CM

## 2019-09-16 MED ORDER — QVAR REDIHALER 80 MCG/ACT IN AERB: 2.0000 | INHALATION_SPRAY | Freq: Two times a day (BID) | RESPIRATORY_TRACT | 3 refills | Status: DC

## 2019-09-16 MED ORDER — DOXYCYCLINE HYCLATE 100 MG PO TABS: 100.0000 mg | ORAL_TABLET | Freq: Two times a day (BID) | ORAL | 0 refills | Status: DC

## 2019-09-16 NOTE — Progress Notes (Signed)
Virtual Visit via Video   I connected with patient on 09/16/19 at 11:00 AM EST by a video enabled telemedicine application and verified that I am speaking with the correct person using two identifiers.  Location patient: Home Location provider: [facility]up[facility], Office Persons participating in the virtual visit: Patient, Provider, [place] (Jess B)  I discussed the limitations of evaluation and management by telemedicine and the availability of in person appointments. The patient expressed understanding and agreed to proceed.  Subjective:   HPI:   Sinus- 'my sinuses have been giving me a fit this week'.  + HA, maxillary facial pain.  Using nasal sprays (both) and allergy meds.  No fever.  + decreased energy.  + nasal congestion, PND.  + pressure w/ leaning forward.  Denies cough.  No ear pain.  Pt reports this feels similar to previous sinus infxns.  No body aches, chills, SOB.    ROS:   See pertinent positives and negatives per HPI.  Patient Active Problem List   Diagnosis Date Noted  . Allergic rhinitis due to pollen 12/13/2018  . Diet-controlled diabetes mellitus ([place]) 02/09/2018  . Statin intolerance 11/04/2017  . Physical exam 08/04/2017  . Family history of early CAD 03/10/2017  . HTN (hypertension) 02/15/2017  . Herniated nucleus pulposus, L5-S1 06/22/2014  . Asthma, moderate persistent 10/09/2013  . Fatigue 12/12/2012  . GERD (gastroesophageal reflux disease) 09/02/2012  . Vitamin D deficiency 09/15/2010  . DEPRESSIVE DISORDER 07/17/2010  . RHINITIS 06/14/2009  . MUSCLE WEAKNESS (GENERALIZED) 01/03/2009  . Hyperlipidemia 10/10/2008  . BIPOLAR AFFECTIVE DISORDER 10/10/2008  . ADD 10/10/2008    Social History   Tobacco Use  . Smoking status: Never Smoker  . Smokeless tobacco: Never Used  Substance Use Topics  . Alcohol use: No    Alcohol/week: 0.0 standard drinks    Comment: Occasional glass of wine/"hardly ever"    Current Outpatient Medications:  .   azelastine (ASTELIN) 0.1 % nasal spray, Place 2 sprays into both nostrils 2 (two) times daily. Use in each nostril as directed, Disp: 30 mL, Rfl: 3 .  Cholecalciferol (VITAMIN D) 2000 units tablet, Take 2,000 Units by mouth daily., Disp: , Rfl:  .  clonazePAM (KLONOPIN) 2 MG tablet, 1 mg 4 (four) times daily as needed. , Disp: , Rfl: 0 .  estradiol (ESTRACE) 1 MG tablet, Take 1 tablet (1 mg total) by mouth daily., Disp: 90 tablet, Rfl: 4 .  Evolocumab (REPATHA SURECLICK) [id number] MG/ML SOAJ, Inject 140 mg into the skin every 14 (fourteen) days., Disp: 2 pen, Rfl: 11 .  fenofibrate (TRICOR) 145 MG tablet, TAKE ONE TABLET BY MOUTH DAILY, Disp: 30 tablet, Rfl: 5 .  fexofenadine (ALLEGRA) 180 MG tablet, Take 180 mg by mouth daily., Disp: , Rfl:  .  fluticasone (FLONASE) 50 MCG/ACT nasal spray, Place 2 sprays into both nostrils daily., Disp: 16 g, Rfl: 6 .  Fluticasone Furoate (ARNUITY ELLIPTA) 100 MCG/ACT AEPB, Inhale 1 puff into the lungs daily., Disp: 30 each, Rfl: 6 .  gabapentin (NEURONTIN) 600 MG tablet, Take 600 mg by mouth 4 (four) times daily. , Disp: , Rfl:  .  lamoTRIgine (LAMICTAL) 200 MG tablet, Take 200 mg by mouth 2 (two) times daily. , Disp: , Rfl:  .  meclizine (ANTIVERT) 25 MG tablet, Take 1 tablet (25 mg total) by mouth 3 (three) times daily as needed for dizziness., Disp: 30 tablet, Rfl: 0 .  methocarbamol (ROBAXIN) 500 MG tablet, Take 1 tablet (500 mg total) by  mouth every 8 (eight) hours as needed., Disp: 15 tablet, Rfl: 0 .  metoprolol succinate (TOPROL-XL) 50 MG 24 hr tablet, TAKE 1 TABLET BY MOUTH EVERY DAY *WITH OR IMMEDIATELY FOLLOWING A MEAL*, Disp: 90 tablet, Rfl: 1 .  Multiple Vitamin (MULTIVITAMIN WITH MINERALS) TABS tablet, Take 1 tablet by mouth daily., Disp: , Rfl:  .  naproxen (NAPROSYN) 500 MG tablet, Take 1 tablet (500 mg total) by mouth 2 (two) times daily., Disp: 10 tablet, Rfl: 0 .  OLANZapine (ZYPREXA) 10 MG tablet, Take 10 mg by mouth daily as needed., Disp: , Rfl:  .   omega-3 acid ethyl esters (LOVAZA) 1 g capsule, Take 2 capsules (2 g total) by mouth 2 (two) times daily., Disp: 120 capsule, Rfl: 0 .  QUEtiapine (SEROQUEL XR) 400 MG 24 hr tablet, Take one tablet every evening at bedtime, Disp: , Rfl: 0 .  QUEtiapine (SEROQUEL) 50 MG tablet, TK 1 T PO TID, Disp: , Rfl: 3 .  venlafaxine (EFFEXOR-XR) 75 MG 24 hr capsule, Take 225 mg by mouth daily. , Disp: , Rfl:   Allergies  Allergen Reactions  . Nitrofurantoin Monohyd Macro Nausea Only    Severe headache  . Statins Other (See Comments)    Severe weakness, pain  . Sulfa Antibiotics Nausea And Vomiting  . Alprazolam Anxiety and Other (See Comments)    Hyper   . Amoxicillin Rash  . Prednisone Anxiety    Objective:   Temp 98.2 F (36.8 C) (Tympanic)   LMP 11/09/2000   AAOx3, NAD NCAT, EOMI No obvious CN deficits + TTP over maxillary sinuses + audible nasal congestion Coloring WNL Pt is able to speak clearly, coherently without shortness of breath or increased work of breathing.  Thought process is linear.  Mood is appropriate.   Assessment and Plan:   Bacterial sinusitis- pt has hx of similar and reports this feels like previous sinus infxns.  No red flags or sxs consistent w/ COVID at this time.  Start Doxy due to Amox allergy.  Pt expressed understanding and is in agreement w/ plan.    Annye Asa, MD 09/16/2019

## 2019-09-16 NOTE — Progress Notes (Signed)
I have discussed the procedure for the virtual visit with the patient who has given consent to proceed with assessment and treatment.   Brylea Pita L Dwaine Pringle, CMA     

## 2019-09-20 DIAGNOSIS — R69 Illness, unspecified: Secondary | ICD-10-CM | POA: Diagnosis not present

## 2019-09-26 DIAGNOSIS — H04123 Dry eye syndrome of bilateral lacrimal glands: Secondary | ICD-10-CM | POA: Diagnosis not present

## 2019-09-28 DIAGNOSIS — R69 Illness, unspecified: Secondary | ICD-10-CM | POA: Diagnosis not present

## 2019-10-11 DIAGNOSIS — R69 Illness, unspecified: Secondary | ICD-10-CM | POA: Diagnosis not present

## 2019-10-13 LAB — HM DIABETES EYE EXAM

## 2019-10-18 DIAGNOSIS — H26492 Other secondary cataract, left eye: Secondary | ICD-10-CM | POA: Diagnosis not present

## 2019-10-19 DIAGNOSIS — R69 Illness, unspecified: Secondary | ICD-10-CM | POA: Diagnosis not present

## 2019-10-26 DIAGNOSIS — Z961 Presence of intraocular lens: Secondary | ICD-10-CM | POA: Diagnosis not present

## 2019-11-01 DIAGNOSIS — R69 Illness, unspecified: Secondary | ICD-10-CM | POA: Diagnosis not present

## 2019-11-06 DIAGNOSIS — H26491 Other secondary cataract, right eye: Secondary | ICD-10-CM | POA: Diagnosis not present

## 2019-11-06 DIAGNOSIS — G4733 Obstructive sleep apnea (adult) (pediatric): Secondary | ICD-10-CM | POA: Diagnosis not present

## 2019-11-07 DIAGNOSIS — R69 Illness, unspecified: Secondary | ICD-10-CM | POA: Diagnosis not present

## 2019-11-08 ENCOUNTER — Telehealth: Payer: Self-pay | Admitting: Pharmacist

## 2019-11-08 MED ORDER — OMEGA-3-ACID ETHYL ESTERS 1 G PO CAPS: 2.0000 | ORAL_CAPSULE | Freq: Two times a day (BID) | ORAL | 1 refills | Status: DC

## 2019-11-08 NOTE — Telephone Encounter (Signed)
F/U appointment with DR Irish Lack scheduled (next available Feb/5th). Will refill lovaza until appt time.

## 2019-11-08 NOTE — Telephone Encounter (Signed)
Patient calling requesting a refill on her Omega-3 supplement. She says she hasn't had it in about 2 weeks and that Raquel has helped her in the past.

## 2019-11-13 ENCOUNTER — Encounter: Payer: Self-pay | Admitting: Family Medicine

## 2019-11-13 ENCOUNTER — Ambulatory Visit (INDEPENDENT_AMBULATORY_CARE_PROVIDER_SITE_OTHER): Payer: Medicare HMO | Admitting: Family Medicine

## 2019-11-13 ENCOUNTER — Other Ambulatory Visit: Payer: Self-pay

## 2019-11-13 ENCOUNTER — Telehealth: Payer: Self-pay

## 2019-11-13 VITALS — BP 119/89 | HR 70 | Temp 98.2°F | Resp 16 | Ht 69.0 in | Wt 229.5 lb

## 2019-11-13 DIAGNOSIS — L0292 Furuncle, unspecified: Secondary | ICD-10-CM

## 2019-11-13 MED ORDER — DOXYCYCLINE HYCLATE 100 MG PO TABS: 100.0000 mg | ORAL_TABLET | Freq: Two times a day (BID) | ORAL | 0 refills | Status: DC

## 2019-11-13 NOTE — Progress Notes (Signed)
   Subjective:    Patient ID: [name], female    DOB: 03-25-1956, 64 y.o.   MRN: [id number]  HPI Boil- pt had similar area in November that came to a head and drained.  This cleared up but then a few days ago she developed a similar area in R groin that was red, swollen and painful.  No drainage.  'I didn't know what to do'.   Review of Systems For ROS see HPI   This visit occurred during the SARS-CoV-2 public health emergency.  Safety protocols were in place, including screening questions prior to the visit, additional usage of staff PPE, and extensive cleaning of exam room while observing appropriate contact time as indicated for disinfecting solutions.       Objective:   Physical Exam Vitals reviewed.  Constitutional:      Appearance: Normal appearance. She is obese.  Skin:    General: Skin is warm and dry.     Comments: R groin w/ 2.5 cm boil in groin crease.  No drainage but fluctuance present.  Pt would like area 'popped' if possible.  Area prepped w/ alcohol pad, sprayed w/ cold spray, and then incised w/ 10 blade.  Copious purulent material expressed.  Purulent material cleared and only blood remained.  Attempted to pack area due to amount of bleeding but no cavity to pack.  Held pressure w/ gauze and applied gauze dressing w/ bacitracin.  Pt tolerated procedure w/o difficulty.  Neurological:     Mental Status: She is alert.           Assessment & Plan:  Boil- pt consented to and tolerated I&D w/o difficulty.  Will treat any residual infxn w/ Doxy.  Wound care advice given.  Pt expressed understanding and is in agreement w/ plan.

## 2019-11-13 NOTE — Telephone Encounter (Signed)
Patient called in stating that she has a re occurring boil in her groin. States that it "popped" last month, but has returned and "come to a head." Wants to be seen in office ASAP to have it lancet. Please advise

## 2019-11-13 NOTE — Telephone Encounter (Signed)
Patient has been scheduled for in office visit at 215

## 2019-11-13 NOTE — Patient Instructions (Addendum)
Schedule a diabetes follow up in the next few weeks START the Doxycycline twice daily- take w/ food Keep area clean and covered (until it stops draining) Call with any questions or concerns Happy New Year!!!

## 2019-11-13 NOTE — Telephone Encounter (Signed)
If she passes screening she can come in at 2:15 but she needs to know this is a work in and we'll do the best we can to get to her on time

## 2019-11-14 DIAGNOSIS — Z961 Presence of intraocular lens: Secondary | ICD-10-CM | POA: Diagnosis not present

## 2019-11-16 ENCOUNTER — Other Ambulatory Visit: Payer: Self-pay | Admitting: Family Medicine

## 2019-11-16 DIAGNOSIS — R69 Illness, unspecified: Secondary | ICD-10-CM | POA: Diagnosis not present

## 2019-11-29 ENCOUNTER — Ambulatory Visit (INDEPENDENT_AMBULATORY_CARE_PROVIDER_SITE_OTHER): Payer: Medicare HMO | Admitting: Family Medicine

## 2019-11-29 ENCOUNTER — Other Ambulatory Visit: Payer: Self-pay

## 2019-11-29 ENCOUNTER — Encounter: Payer: Self-pay | Admitting: Family Medicine

## 2019-11-29 DIAGNOSIS — E782 Mixed hyperlipidemia: Secondary | ICD-10-CM | POA: Diagnosis not present

## 2019-11-29 DIAGNOSIS — E119 Type 2 diabetes mellitus without complications: Secondary | ICD-10-CM | POA: Diagnosis not present

## 2019-11-29 DIAGNOSIS — I10 Essential (primary) hypertension: Secondary | ICD-10-CM | POA: Diagnosis not present

## 2019-11-29 NOTE — Progress Notes (Signed)
Virtual Visit via Video   I connected with patient on 11/29/19 at  9:30 AM EST by a video enabled telemedicine application and verified that I am speaking with the correct person using two identifiers.  Location patient: Home Location provider: [facility], Office Persons participating in the virtual visit: Patient, Provider, [place] (Jess C)  I discussed the limitations of evaluation and management by telemedicine and the availability of in person appointments. The patient expressed understanding and agreed to proceed.  Subjective:   HPI:   DM- chronic problem, has been diet controlled.  UTD on microalbumin, foot exam, eye exam.  Walking regularly.  Hyperlipidemia- chronic problem, on Repatha and Fenofibrate.  Has started walking regularly.  Denies abd pain, N/V.  HTN- chronic problem, on Metoprolol 50mg  daily.  Denies CP, SOB, HAs, visual changes, edema.  ROS:   See pertinent positives and negatives per HPI.  Patient Active Problem List   Diagnosis Date Noted  . Allergic rhinitis due to pollen 12/13/2018  . Diet-controlled diabetes mellitus ([place]) 02/09/2018  . Statin intolerance 11/04/2017  . Physical exam 08/04/2017  . Family history of early CAD 03/10/2017  . HTN (hypertension) 02/15/2017  . Herniated nucleus pulposus, L5-S1 06/22/2014  . Asthma, moderate persistent 10/09/2013  . Fatigue 12/12/2012  . GERD (gastroesophageal reflux disease) 09/02/2012  . Vitamin D deficiency 09/15/2010  . DEPRESSIVE DISORDER 07/17/2010  . RHINITIS 06/14/2009  . MUSCLE WEAKNESS (GENERALIZED) 01/03/2009  . Hyperlipidemia 10/10/2008  . BIPOLAR AFFECTIVE DISORDER 10/10/2008  . ADD 10/10/2008    Social History   Tobacco Use  . Smoking status: Never Smoker  . Smokeless tobacco: Never Used  Substance Use Topics  . Alcohol use: No    Alcohol/week: 0.0 standard drinks    Comment: Occasional glass of wine/"hardly ever"    Current Outpatient Medications:  .  azelastine  (ASTELIN) 0.1 % nasal spray, Place 2 sprays into both nostrils 2 (two) times daily. Use in each nostril as directed, Disp: 30 mL, Rfl: 3 .  beclomethasone (QVAR REDIHALER) 80 MCG/ACT inhaler, Inhale 2 puffs into the lungs 2 (two) times daily., Disp: 10.6 g, Rfl: 3 .  Cholecalciferol (VITAMIN D) 2000 units tablet, Take 2,000 Units by mouth daily., Disp: , Rfl:  .  clonazePAM (KLONOPIN) 2 MG tablet, 1 mg 4 (four) times daily as needed. , Disp: , Rfl: 0 .  estradiol (ESTRACE) 1 MG tablet, Take 1 tablet (1 mg total) by mouth daily., Disp: 90 tablet, Rfl: 4 .  Evolocumab (REPATHA SURECLICK) XX123456 MG/ML SOAJ, Inject 140 mg into the skin every 14 (fourteen) days., Disp: 2 pen, Rfl: 11 .  fenofibrate (TRICOR) 145 MG tablet, TAKE ONE TABLET BY MOUTH DAILY, Disp: 30 tablet, Rfl: 4 .  fexofenadine (ALLEGRA) 180 MG tablet, Take 180 mg by mouth daily., Disp: , Rfl:  .  fluticasone (FLONASE) 50 MCG/ACT nasal spray, PLACE 2 SPRAYS INTO BOTH NOSTRILS DAILY, Disp: 16 g, Rfl: 5 .  Fluticasone Furoate (ARNUITY ELLIPTA) 100 MCG/ACT AEPB, Inhale 1 puff into the lungs daily., Disp: 30 each, Rfl: 6 .  gabapentin (NEURONTIN) 600 MG tablet, Take 600 mg by mouth 4 (four) times daily. , Disp: , Rfl:  .  lamoTRIgine (LAMICTAL) 200 MG tablet, Take 200 mg by mouth 2 (two) times daily. , Disp: , Rfl:  .  LUMIGAN 0.01 % SOLN, , Disp: , Rfl:  .  methocarbamol (ROBAXIN) 500 MG tablet, Take 1 tablet (500 mg total) by mouth every 8 (eight) hours as needed., Disp: 15  tablet, Rfl: 0 .  metoprolol succinate (TOPROL-XL) 50 MG 24 hr tablet, TAKE 1 TABLET BY MOUTH EVERY DAY *WITH OR IMMEDIATELY FOLLOWING A MEAL*, Disp: 90 tablet, Rfl: 1 .  Multiple Vitamin (MULTIVITAMIN WITH MINERALS) TABS tablet, Take 1 tablet by mouth daily., Disp: , Rfl:  .  naproxen (NAPROSYN) 500 MG tablet, Take 1 tablet (500 mg total) by mouth 2 (two) times daily., Disp: 10 tablet, Rfl: 0 .  OLANZapine (ZYPREXA) 10 MG tablet, Take 10 mg by mouth daily as needed., Disp:  , Rfl:  .  omega-3 acid ethyl esters (LOVAZA) 1 g capsule, Take 2 capsules (2 g total) by mouth 2 (two) times daily., Disp: 120 capsule, Rfl: 1 .  QUEtiapine (SEROQUEL XR) 400 MG 24 hr tablet, Take one tablet every evening at bedtime, Disp: , Rfl: 0 .  QUEtiapine (SEROQUEL) 50 MG tablet, TK 1 T PO TID, Disp: , Rfl: 3 .  venlafaxine (EFFEXOR-XR) 75 MG 24 hr capsule, Take 225 mg by mouth daily. , Disp: , Rfl:   Allergies  Allergen Reactions  . Nitrofurantoin Monohyd Macro Nausea Only    Severe headache  . Statins Other (See Comments)    Severe weakness, pain  . Sulfa Antibiotics Nausea And Vomiting  . Alprazolam Anxiety and Other (See Comments)    Hyper   . Amoxicillin Rash  . Prednisone Anxiety    Objective:   LMP 11/09/2000  AAOx3, NAD NCAT, EOMI No obvious CN deficits Coloring WNL Pt is able to speak clearly, coherently without shortness of breath or increased work of breathing.  Thought process is linear.  Mood is appropriate.   Assessment and Plan:   DM- chronic problem.  Thus far has been able to control w/ healthy diet and regular exercise.  Applauded her efforts.  UTD on foot exam, eye exam, microalbumin.  Check labs and determine if medication is needed.  HTN- chronic problem.  Hx of good control.  Unable to check home BP today but will check when she returns to office.  No anticipated med changes.  Will follow.  Hyperlipidemia- chronic problem.  Continues Repatha w/ lipid clinic.  Will get labs.   Annye Asa, MD 11/29/2019

## 2019-11-29 NOTE — Progress Notes (Signed)
I have discussed the procedure for the virtual visit with the patient who has given consent to proceed with assessment and treatment.  Patient unable to obtain vital signs.  Fritz Pickerel, CMA

## 2019-11-30 DIAGNOSIS — R69 Illness, unspecified: Secondary | ICD-10-CM | POA: Diagnosis not present

## 2019-12-04 ENCOUNTER — Telehealth: Payer: Self-pay

## 2019-12-04 MED ORDER — PRALUENT 150 MG/ML ~~LOC~~ SOAJ: 150.0000 mg | SUBCUTANEOUS | 11 refills | Status: DC

## 2019-12-04 NOTE — Telephone Encounter (Signed)
Called and spoke w/pt about the approval of the praluent and sent rx, pt voiced understanding

## 2019-12-06 ENCOUNTER — Telehealth: Payer: Self-pay

## 2019-12-06 NOTE — Telephone Encounter (Signed)
LMOVM to schedule labs and CPE in 3-4 months.

## 2019-12-07 DIAGNOSIS — G4733 Obstructive sleep apnea (adult) (pediatric): Secondary | ICD-10-CM | POA: Diagnosis not present

## 2019-12-13 DIAGNOSIS — R69 Illness, unspecified: Secondary | ICD-10-CM | POA: Diagnosis not present

## 2019-12-13 NOTE — Progress Notes (Deleted)
Cardiology Office Note   Date:  12/13/2019   ID:  [name], DOB December 30, 1955, MRN [id number]  PCP:  Midge Minium, MD    No chief complaint on file.  Hyperlipidemia/mitral valve prolapse  Wt Readings from Last 3 Encounters:  11/13/19 229 lb 8 oz (104.1 kg)  08/08/19 227 lb (103 kg)  07/05/19 221 lb (100.2 kg)       History of Present Illness: [name] is a 64 y.o. female  has had a h/o MVP for years.  She was treated with atenolol for many years.    She also has a family history of early heart disease.  She had some higher blood pressures and palpitations.  Her atenolol was switched to metoprolol and HCTZ was started for BP control.  She has had issues with palpitations and difficult to control lipids.  Past Medical History:  Diagnosis Date  . Allergic rhinitis   . Anxiety   . Arthritis    R knee, Left Knee  . Asthma   . Bipolar disorder ([place])   . Cataract   . Complication of anesthesia    had bad anxiety after anesthesia  . Constipation   . Constipation, chronic   . Depression   . Diabetes mellitus without complication ([place])   . Dysrhythmia    Palpations occ, takes Tenormin  . GERD (gastroesophageal reflux disease)   . Heart murmur   . Hyperlipidemia   . Hypertension   . MVP (mitral valve prolapse)   . Urinary tract infection    frequent, see Urololgist 12/16/2010    Past Surgical History:  Procedure Laterality Date  . ABDOMINAL HYSTERECTOMY  2002   no oophorectomy  . BACK SURGERY  2001   L5 - S1  . CATARACT EXTRACTION    . NASAL SINUS SURGERY  01/2010   x2  . TONSILLECTOMY    . TOTAL KNEE ARTHROPLASTY  12/23/2011   Procedure: TOTAL KNEE ARTHROPLASTY;  Surgeon: Ninetta Lights, MD;  Location: [facility];  Service: Orthopedics;  Laterality: Right;  DR MURPHY WANTS 120 MINUTES FOR SURGERY  . TOTAL KNEE ARTHROPLASTY  08/31/2012   left knee  . TOTAL KNEE ARTHROPLASTY  08/31/2012   Procedure: TOTAL KNEE ARTHROPLASTY;  Surgeon: Ninetta Lights,  MD;  Location: [facility];  Service: Orthopedics;  Laterality: Left;     Current Outpatient Medications  Medication Sig Dispense Refill  . Alirocumab (PRALUENT) 150 MG/ML SOAJ Inject 150 mg into the skin every 14 (fourteen) days. 2 pen 11  . azelastine (ASTELIN) 0.1 % nasal spray Place 2 sprays into both nostrils 2 (two) times daily. Use in each nostril as directed 30 mL 3  . beclomethasone (QVAR REDIHALER) 80 MCG/ACT inhaler Inhale 2 puffs into the lungs 2 (two) times daily. 10.6 g 3  . Cholecalciferol (VITAMIN D) 2000 units tablet Take 2,000 Units by mouth daily.    . clonazePAM (KLONOPIN) 2 MG tablet 1 mg 4 (four) times daily as needed.   0  . estradiol (ESTRACE) 1 MG tablet Take 1 tablet (1 mg total) by mouth daily. 90 tablet 4  . fenofibrate (TRICOR) 145 MG tablet TAKE ONE TABLET BY MOUTH DAILY 30 tablet 4  . fexofenadine (ALLEGRA) 180 MG tablet Take 180 mg by mouth daily.    . fluticasone (FLONASE) 50 MCG/ACT nasal spray PLACE 2 SPRAYS INTO BOTH NOSTRILS DAILY 16 g 5  . Fluticasone Furoate (ARNUITY ELLIPTA) 100 MCG/ACT AEPB Inhale 1 puff into the lungs  daily. 30 each 6  . gabapentin (NEURONTIN) 600 MG tablet Take 600 mg by mouth 4 (four) times daily.     Marland Kitchen lamoTRIgine (LAMICTAL) 200 MG tablet Take 200 mg by mouth 2 (two) times daily.     Marland Kitchen LUMIGAN 0.01 % SOLN     . methocarbamol (ROBAXIN) 500 MG tablet Take 1 tablet (500 mg total) by mouth every 8 (eight) hours as needed. 15 tablet 0  . metoprolol succinate (TOPROL-XL) 50 MG 24 hr tablet TAKE 1 TABLET BY MOUTH EVERY DAY *WITH OR IMMEDIATELY FOLLOWING A MEAL* 90 tablet 1  . Multiple Vitamin (MULTIVITAMIN WITH MINERALS) TABS tablet Take 1 tablet by mouth daily.    . naproxen (NAPROSYN) 500 MG tablet Take 1 tablet (500 mg total) by mouth 2 (two) times daily. 10 tablet 0  . OLANZapine (ZYPREXA) 10 MG tablet Take 10 mg by mouth daily as needed.    Marland Kitchen omega-3 acid ethyl esters (LOVAZA) 1 g capsule Take 2 capsules (2 g total) by mouth 2 (two) times  daily. 120 capsule 1  . QUEtiapine (SEROQUEL XR) 400 MG 24 hr tablet Take one tablet every evening at bedtime  0  . QUEtiapine (SEROQUEL) 50 MG tablet TK 1 T PO TID  3  . venlafaxine (EFFEXOR-XR) 75 MG 24 hr capsule Take 225 mg by mouth daily.      No current facility-administered medications for this visit.    Allergies:   Nitrofurantoin monohyd macro, Statins, Sulfa antibiotics, Alprazolam, Amoxicillin, and Prednisone    Social History:  The patient  reports that she has never smoked. She has never used smokeless tobacco. She reports that she does not drink alcohol or use drugs.   Family History:  The patient's ***family history includes Coronary artery disease in her father; Heart attack (age of onset: 68) in her father; Heart disease in her father; Hyperlipidemia in her brother; Hypertension in her brother; Lung disease in her mother.    ROS:  Please see the history of present illness.   Otherwise, review of systems are positive for ***.   All other systems are reviewed and negative.    PHYSICAL EXAM: VS:  LMP 11/09/2000  , BMI There is no height or weight on file to calculate BMI. GEN: Well nourished, well developed, in no acute distress HEENT: normal Neck: no JVD, carotid bruits, or masses Cardiac: ***RRR; no murmurs, rubs, or gallops,no edema  Respiratory:  clear to auscultation bilaterally, normal work of breathing GI: soft, nontender, nondistended, + BS MS: no deformity or atrophy Skin: warm and dry, no rash Neuro:  Strength and sensation are intact Psych: euthymic mood, full affect   EKG:   The ekg ordered today demonstrates ***   Recent Labs: 01/26/2019: ALT 13 08/08/2019: BUN 14; Creatinine, Ser 0.55; Hemoglobin 12.3; Platelets 217.0; Potassium 4.0; Sodium 139; TSH 0.96   Lipid Panel    Component Value Date/Time   CHOL 216 (H) 01/26/2019 1030   CHOL 163 11/14/2018 1447   CHOL 312 (H) 06/19/2015 0924   TRIG 221.0 (H) 01/26/2019 1030   TRIG 657 (H) 06/19/2015  0924   HDL 66.40 01/26/2019 1030   HDL 71 11/14/2018 1447   HDL 44 06/19/2015 0924   CHOLHDL 3 01/26/2019 1030   VLDL 44.2 (H) 01/26/2019 1030   LDLCALC 66 11/14/2018 1447   LDLCALC Comment 06/19/2015 0924   LDLDIRECT 123.0 01/26/2019 1030     Other studies Reviewed: Additional studies/ records that were reviewed today with results demonstrating: ***.  ASSESSMENT AND PLAN:  1. Hyperlipidemia: Continue Praluent and Lovaza.  Triglycerides were elevated in March 2020.  HDL was 66.  Total cholesterol was 216. 2. Family history of early coronary artery disease: We spoke about regular exercise and healthy diet to lower her risk. 3. Mitral valve prolapse: No longer recommended SBE prophylaxis as of 2018.  No signs of heart failure. 4. Hypertension:   Current medicines are reviewed at length with the patient today.  The patient concerns regarding her medicines were addressed.  The following changes have been made:  No change***  Labs/ tests ordered today include: *** No orders of the defined types were placed in this encounter.   Recommend 150 minutes/week of aerobic exercise Low fat, low carb, high fiber diet recommended  Disposition:   FU in ***   Signed, Larae Grooms, MD  12/13/2019 10:48 PM    [facility] [place], [place], [place]  [address] Phone: [phone]; Fax: [phone]

## 2019-12-14 ENCOUNTER — Ambulatory Visit (INDEPENDENT_AMBULATORY_CARE_PROVIDER_SITE_OTHER): Payer: Medicare HMO

## 2019-12-14 ENCOUNTER — Other Ambulatory Visit: Payer: Self-pay

## 2019-12-14 DIAGNOSIS — E119 Type 2 diabetes mellitus without complications: Secondary | ICD-10-CM

## 2019-12-14 DIAGNOSIS — I10 Essential (primary) hypertension: Secondary | ICD-10-CM

## 2019-12-14 DIAGNOSIS — E782 Mixed hyperlipidemia: Secondary | ICD-10-CM | POA: Diagnosis not present

## 2019-12-14 LAB — LIPID PANEL
Cholesterol: 165 mg/dL (ref 0–200)
HDL: 64.6 mg/dL
LDL Cholesterol: 70 mg/dL (ref 0–99)
NonHDL: 100.71
Total CHOL/HDL Ratio: 3
Triglycerides: 153 mg/dL — ABNORMAL HIGH (ref 0.0–149.0)
VLDL: 30.6 mg/dL (ref 0.0–40.0)

## 2019-12-14 LAB — CBC WITH DIFFERENTIAL/PLATELET
Basophils Absolute: 0 10*3/uL (ref 0.0–0.1)
Basophils Relative: 0.3 % (ref 0.0–3.0)
Eosinophils Absolute: 0 10*3/uL (ref 0.0–0.7)
Eosinophils Relative: 0 % (ref 0.0–5.0)
HCT: 38.4 % (ref 36.0–46.0)
Hemoglobin: 13 g/dL (ref 12.0–15.0)
Lymphocytes Relative: 23.3 % (ref 12.0–46.0)
Lymphs Abs: 1.2 10*3/uL (ref 0.7–4.0)
MCHC: 33.9 g/dL (ref 30.0–36.0)
MCV: 90.4 fl (ref 78.0–100.0)
Monocytes Absolute: 0.3 10*3/uL (ref 0.1–1.0)
Monocytes Relative: 6.7 % (ref 3.0–12.0)
Neutro Abs: 3.5 10*3/uL (ref 1.4–7.7)
Neutrophils Relative %: 69.7 % (ref 43.0–77.0)
Platelets: 208 10*3/uL (ref 150.0–400.0)
RBC: 4.24 Mil/uL (ref 3.87–5.11)
RDW: 13.9 % (ref 11.5–15.5)
WBC: 5 10*3/uL (ref 4.0–10.5)

## 2019-12-14 LAB — BASIC METABOLIC PANEL
BUN: 19 mg/dL (ref 6–23)
CO2: 24 mEq/L (ref 19–32)
Calcium: 10 mg/dL (ref 8.4–10.5)
Chloride: 106 mEq/L (ref 96–112)
Creatinine, Ser: 0.69 mg/dL (ref 0.40–1.20)
GFR: 86.12 mL/min (ref >60.00)
Glucose, Bld: 144 mg/dL — ABNORMAL HIGH (ref 70–99)
Potassium: 4.1 mEq/L (ref 3.5–5.1)
Sodium: 139 mEq/L (ref 135–145)

## 2019-12-14 LAB — HEPATIC FUNCTION PANEL
ALT: 14 U/L (ref 0–35)
AST: 11 U/L (ref 0–37)
Albumin: 4.8 g/dL (ref 3.5–5.2)
Alkaline Phosphatase: 19 U/L — ABNORMAL LOW (ref 39–117)
Bilirubin, Direct: 0.1 mg/dL (ref 0.0–0.3)
Total Bilirubin: 0.5 mg/dL (ref 0.2–1.2)
Total Protein: 7.1 g/dL (ref 6.0–8.3)

## 2019-12-14 LAB — TSH: TSH: 2.21 u[IU]/mL (ref 0.35–4.50)

## 2019-12-14 LAB — HEMOGLOBIN A1C: Hgb A1c MFr Bld: 6.7 % — ABNORMAL HIGH (ref 4.6–6.5)

## 2019-12-15 ENCOUNTER — Ambulatory Visit: Payer: Medicare HMO | Admitting: Interventional Cardiology

## 2019-12-15 ENCOUNTER — Encounter: Payer: Self-pay | Admitting: General Practice

## 2019-12-18 ENCOUNTER — Ambulatory Visit (INDEPENDENT_AMBULATORY_CARE_PROVIDER_SITE_OTHER): Payer: Medicare HMO | Admitting: Physician Assistant

## 2019-12-18 ENCOUNTER — Encounter: Payer: Self-pay | Admitting: Physician Assistant

## 2019-12-18 ENCOUNTER — Other Ambulatory Visit: Payer: Self-pay

## 2019-12-18 VITALS — Temp 98.3°F

## 2019-12-18 DIAGNOSIS — J4540 Moderate persistent asthma, uncomplicated: Secondary | ICD-10-CM | POA: Diagnosis not present

## 2019-12-18 DIAGNOSIS — B9689 Other specified bacterial agents as the cause of diseases classified elsewhere: Secondary | ICD-10-CM | POA: Diagnosis not present

## 2019-12-18 DIAGNOSIS — J019 Acute sinusitis, unspecified: Secondary | ICD-10-CM | POA: Diagnosis not present

## 2019-12-18 DIAGNOSIS — J301 Allergic rhinitis due to pollen: Secondary | ICD-10-CM

## 2019-12-18 MED ORDER — ALBUTEROL SULFATE HFA 108 (90 BASE) MCG/ACT IN AERS: 2.0000 | INHALATION_SPRAY | Freq: Four times a day (QID) | RESPIRATORY_TRACT | 2 refills | Status: DC | PRN

## 2019-12-18 MED ORDER — DOXYCYCLINE HYCLATE 100 MG PO CAPS: 100.0000 mg | ORAL_CAPSULE | Freq: Two times a day (BID) | ORAL | 0 refills | Status: DC

## 2019-12-18 NOTE — Progress Notes (Signed)
Virtual Visit via Video   I connected with patient on 12/18/19 at  3:00 PM EST by a video enabled telemedicine application and verified that I am speaking with the correct person using two identifiers.  Location patient: Home Location provider: [facility], Office Persons participating in the virtual visit: Patient, Provider, [place] (Patina Moore)  I discussed the limitations of evaluation and management by telemedicine and the availability of in person appointments. The patient expressed understanding and agreed to proceed.  Subjective:   HPI:   Patient with history of moderate persistent asthma presents via Doxy.Me today c/o increasing asthmatic symptoms with cold weather.  Notes having chest tightness when going outside on her walks.  Denies significant wheezing with this.  Denies increased cough or any congestion.  Denies symptoms while in her home.  Denies nighttime symptoms.  Is taking her Qvar twice daily as directed.  Does not have a rescue inhaler.  Patient also endorses 2 weeks of bilateral sinus pressure with sinus headache and maxillary sinus pain, worsening since onset.  Denies fever or chills.  Denies loss of taste or smell.  Denies GI symptoms.  Denies any ear pressure or pain.  Denies recent travel or sick contact.  Denies known exposure to Covid.  Has been taking her allergy medications as directed which helps with her nasal drainage but do not help with sinus pain.  Has history of sinusitis.   ROS:   See pertinent positives and negatives per HPI.  Patient Active Problem List   Diagnosis Date Noted  . Allergic rhinitis due to pollen 12/13/2018  . Diet-controlled diabetes mellitus ([place]) 02/09/2018  . Statin intolerance 11/04/2017  . Physical exam 08/04/2017  . Family history of early CAD 03/10/2017  . HTN (hypertension) 02/15/2017  . Herniated nucleus pulposus, L5-S1 06/22/2014  . Asthma, moderate persistent 10/09/2013  . Fatigue 12/12/2012  . GERD  (gastroesophageal reflux disease) 09/02/2012  . Vitamin D deficiency 09/15/2010  . DEPRESSIVE DISORDER 07/17/2010  . RHINITIS 06/14/2009  . MUSCLE WEAKNESS (GENERALIZED) 01/03/2009  . Hyperlipidemia 10/10/2008  . BIPOLAR AFFECTIVE DISORDER 10/10/2008  . ADD 10/10/2008    Social History   Tobacco Use  . Smoking status: Never Smoker  . Smokeless tobacco: Never Used  Substance Use Topics  . Alcohol use: No    Alcohol/week: 0.0 standard drinks    Comment: Occasional glass of wine/"hardly ever"    Current Outpatient Medications:  .  Alirocumab (PRALUENT) 150 MG/ML SOAJ, Inject 150 mg into the skin every 14 (fourteen) days., Disp: 2 pen, Rfl: 11 .  azelastine (ASTELIN) 0.1 % nasal spray, Place 2 sprays into both nostrils 2 (two) times daily. Use in each nostril as directed, Disp: 30 mL, Rfl: 3 .  beclomethasone (QVAR REDIHALER) 80 MCG/ACT inhaler, Inhale 2 puffs into the lungs 2 (two) times daily., Disp: 10.6 g, Rfl: 3 .  Cholecalciferol (VITAMIN D) 2000 units tablet, Take 2,000 Units by mouth daily., Disp: , Rfl:  .  clonazePAM (KLONOPIN) 2 MG tablet, 1 mg 4 (four) times daily as needed. , Disp: , Rfl: 0 .  estradiol (ESTRACE) 1 MG tablet, Take 1 tablet (1 mg total) by mouth daily., Disp: 90 tablet, Rfl: 4 .  fenofibrate (TRICOR) 145 MG tablet, TAKE ONE TABLET BY MOUTH DAILY, Disp: 30 tablet, Rfl: 4 .  fexofenadine (ALLEGRA) 180 MG tablet, Take 180 mg by mouth daily., Disp: , Rfl:  .  fluticasone (FLONASE) 50 MCG/ACT nasal spray, PLACE 2 SPRAYS INTO BOTH NOSTRILS DAILY, Disp: 16  g, Rfl: 5 .  Fluticasone Furoate (ARNUITY ELLIPTA) 100 MCG/ACT AEPB, Inhale 1 puff into the lungs daily., Disp: 30 each, Rfl: 6 .  gabapentin (NEURONTIN) 600 MG tablet, Take 600 mg by mouth 4 (four) times daily. , Disp: , Rfl:  .  lamoTRIgine (LAMICTAL) 200 MG tablet, Take 200 mg by mouth 2 (two) times daily. , Disp: , Rfl:  .  LUMIGAN 0.01 % SOLN, , Disp: , Rfl:  .  methocarbamol (ROBAXIN) 500 MG tablet, Take 1  tablet (500 mg total) by mouth every 8 (eight) hours as needed., Disp: 15 tablet, Rfl: 0 .  metoprolol succinate (TOPROL-XL) 50 MG 24 hr tablet, TAKE 1 TABLET BY MOUTH EVERY DAY *WITH OR IMMEDIATELY FOLLOWING A MEAL*, Disp: 90 tablet, Rfl: 1 .  Multiple Vitamin (MULTIVITAMIN WITH MINERALS) TABS tablet, Take 1 tablet by mouth daily., Disp: , Rfl:  .  naproxen (NAPROSYN) 500 MG tablet, Take 1 tablet (500 mg total) by mouth 2 (two) times daily., Disp: 10 tablet, Rfl: 0 .  OLANZapine (ZYPREXA) 10 MG tablet, Take 10 mg by mouth daily as needed., Disp: , Rfl:  .  omega-3 acid ethyl esters (LOVAZA) 1 g capsule, Take 2 capsules (2 g total) by mouth 2 (two) times daily., Disp: 120 capsule, Rfl: 1 .  QUEtiapine (SEROQUEL XR) 400 MG 24 hr tablet, Take one tablet every evening at bedtime, Disp: , Rfl: 0 .  QUEtiapine (SEROQUEL) 50 MG tablet, TK 1 T PO TID, Disp: , Rfl: 3 .  venlafaxine (EFFEXOR-XR) 75 MG 24 hr capsule, Take 225 mg by mouth daily. , Disp: , Rfl:   Allergies  Allergen Reactions  . Nitrofurantoin Monohyd Macro Nausea Only    Severe headache  . Statins Other (See Comments)    Severe weakness, pain  . Sulfa Antibiotics Nausea And Vomiting  . Alprazolam Anxiety and Other (See Comments)    Hyper   . Amoxicillin Rash  . Prednisone Anxiety    Objective:   LMP 11/09/2000   Patient is well-developed, well-nourished in no acute distress.  Resting comfortably at home.  Head is normocephalic, atraumatic.  No labored breathing.  Speech is clear and coherent with logical content.  Patient is alert and oriented at baseline.   Assessment and Plan:    1. Moderate persistent asthma without complication Having symptoms when going out in the cold, especially with exercise.  No increased symptoms otherwise or nighttime symptoms reported.  We will have her continue Qvar twice daily as directed.  Will Rx albuterol MDI to have on hand to use prior to exercise outdoors.  She is to follow-up with her  PCP regarding how this is working. - albuterol (VENTOLIN HFA) 108 (90 Base) MCG/ACT inhaler; Inhale 2 puffs into the lungs every 6 (six) hours as needed for wheezing or shortness of breath.  Dispense: 6.7 g; Refill: 2  2. Allergic rhinitis due to pollen, unspecified seasonality Continue current allergy regimen.  3. Acute bacterial sinusitis Rx doxycycline.  Increase fluids.  Rest.  Saline nasal spray.  Probiotic.  Mucinex as directed.  Humidifier in bedroom.  Continue allergy regimen.  Call or return to clinic if symptoms are not improving.  - doxycycline (VIBRAMYCIN) 100 MG capsule; Take 1 capsule (100 mg total) by mouth 2 (two) times daily.  Dispense: 20 capsule; Refill: 0 .   Leeanne Rio, [place] 12/18/2019

## 2019-12-18 NOTE — Progress Notes (Signed)
I have discussed the procedure for the virtual visit with the patient who has given consent to proceed with assessment and treatment.   Atley Scarboro S Josephyne Tarter, CMA     

## 2019-12-18 NOTE — Patient Instructions (Signed)
Instructions sent to MyChart.   Please take antibiotic as directed.  Increase fluid intake.  Use Saline nasal spray.  Take a daily multivitamin. Continue your allergy medications. Continue the Qvar as directed twice daily. I have sent in the rescue inhaler for you to have on hand to use as directed if needed for acute asthma symptoms. Can be helpful before your outdoor walks in winter since the cold triggers asthma.  Place a humidifier in the bedroom.  Please call or return clinic if symptoms are not improving.  Sinusitis Sinusitis is redness, soreness, and swelling (inflammation) of the paranasal sinuses. Paranasal sinuses are air pockets within the bones of your face (beneath the eyes, the middle of the forehead, or above the eyes). In healthy paranasal sinuses, mucus is able to drain out, and air is able to circulate through them by way of your nose. However, when your paranasal sinuses are inflamed, mucus and air can become trapped. This can allow bacteria and other germs to grow and cause infection. Sinusitis can develop quickly and last only a short time (acute) or continue over a long period (chronic). Sinusitis that lasts for more than 12 weeks is considered chronic.  CAUSES  Causes of sinusitis include:  Allergies.  Structural abnormalities, such as displacement of the cartilage that separates your nostrils (deviated septum), which can decrease the air flow through your nose and sinuses and affect sinus drainage.  Functional abnormalities, such as when the small hairs (cilia) that line your sinuses and help remove mucus do not work properly or are not present. SYMPTOMS  Symptoms of acute and chronic sinusitis are the same. The primary symptoms are pain and pressure around the affected sinuses. Other symptoms include:  Upper toothache.  Earache.  Headache.  Bad breath.  Decreased sense of smell and taste.  A cough, which worsens when you are lying  flat.  Fatigue.  Fever.  Thick drainage from your nose, which often is green and may contain pus (purulent).  Swelling and warmth over the affected sinuses. DIAGNOSIS  Your caregiver will perform a physical exam. During the exam, your caregiver may:  Look in your nose for signs of abnormal growths in your nostrils (nasal polyps).  Tap over the affected sinus to check for signs of infection.  View the inside of your sinuses (endoscopy) with a special imaging device with a light attached (endoscope), which is inserted into your sinuses. If your caregiver suspects that you have chronic sinusitis, one or more of the following tests may be recommended:  Allergy tests.  Nasal culture A sample of mucus is taken from your nose and sent to a lab and screened for bacteria.  Nasal cytology A sample of mucus is taken from your nose and examined by your caregiver to determine if your sinusitis is related to an allergy. TREATMENT  Most cases of acute sinusitis are related to a viral infection and will resolve on their own within 10 days. Sometimes medicines are prescribed to help relieve symptoms (pain medicine, decongestants, nasal steroid sprays, or saline sprays).  However, for sinusitis related to a bacterial infection, your caregiver will prescribe antibiotic medicines. These are medicines that will help kill the bacteria causing the infection.  Rarely, sinusitis is caused by a fungal infection. In theses cases, your caregiver will prescribe antifungal medicine. For some cases of chronic sinusitis, surgery is needed. Generally, these are cases in which sinusitis recurs more than 3 times per year, despite other treatments. HOME CARE INSTRUCTIONS  Drink plenty of water. Water helps thin the mucus so your sinuses can drain more easily.  Use a humidifier.  Inhale steam 3 to 4 times a day (for example, sit in the bathroom with the shower running).  Apply a warm, moist washcloth to your face 3  to 4 times a day, or as directed by your caregiver.  Use saline nasal sprays to help moisten and clean your sinuses.  Take over-the-counter or prescription medicines for pain, discomfort, or fever only as directed by your caregiver. SEEK IMMEDIATE MEDICAL CARE IF:  You have increasing pain or severe headaches.  You have nausea, vomiting, or drowsiness.  You have swelling around your face.  You have vision problems.  You have a stiff neck.  You have difficulty breathing. MAKE SURE YOU:   Understand these instructions.  Will watch your condition.  Will get help right away if you are not doing well or get worse. Document Released: 10/26/2005 Document Revised: 01/18/2012 Document Reviewed: 11/10/2011 [facility] Patient Information 2014 [place], [place].

## 2019-12-21 ENCOUNTER — Telehealth: Payer: Self-pay | Admitting: Family Medicine

## 2019-12-21 NOTE — Telephone Encounter (Signed)
Called pt back. She in formed that she is feeling better each day. States that she still has a headache at times and is coughing stuff up. Just wanted to report in as [place] had asked her to.

## 2019-12-21 NOTE — Telephone Encounter (Signed)
Pt called in stating that she saw Cody on 12/18/19, she states that she is feeling some what better but wanted to talk to a nurse. I asked patient if I could send a message back and we can call her back with an answer. She states that she just wanted to talk to a nurse or Tabori/Cody. Pt can be reached at the home #

## 2019-12-26 ENCOUNTER — Telehealth: Payer: Self-pay

## 2019-12-26 NOTE — Telephone Encounter (Signed)
Patient called in stating that she is still experiencing congestion and productive cough. States she has one more day of antibiotics, but wants to know if anything else needs to be called in. Denies fevers or chills.

## 2019-12-26 NOTE — Telephone Encounter (Signed)
Spoke with patient, states that she is taking Mucinex, not with decongestant. States she was always told not to use Mucinex D? Also c/o blood in productive cough, dark almost brown in color. States that she thinks it is the infection coming out. Finishes antibiotic tomorrow, taking Mucinex, and nasal rinses.

## 2019-12-26 NOTE — Telephone Encounter (Signed)
In the absence of fever, chills or other worsening symptoms -- would have her complete course of antibiotic. Is she still taking Mucinex to thin congestion? What else is she still taking currently for her symptoms. Any residual sinus symptoms?

## 2019-12-26 NOTE — Telephone Encounter (Signed)
Patient has been scheduled at [facility]

## 2019-12-26 NOTE — Telephone Encounter (Signed)
Finish antibiotic. Giving brown sputum and concern for blood with this would recommend she be seen at our respiratory clinic so she can get lung exam and make sure x-ray is not indicated.

## 2019-12-27 DIAGNOSIS — R69 Illness, unspecified: Secondary | ICD-10-CM | POA: Diagnosis not present

## 2019-12-29 ENCOUNTER — Other Ambulatory Visit: Payer: Self-pay

## 2019-12-29 ENCOUNTER — Ambulatory Visit (INDEPENDENT_AMBULATORY_CARE_PROVIDER_SITE_OTHER): Payer: Medicare HMO | Admitting: Family Medicine

## 2019-12-29 ENCOUNTER — Ambulatory Visit (INDEPENDENT_AMBULATORY_CARE_PROVIDER_SITE_OTHER): Payer: Medicare HMO

## 2019-12-29 VITALS — BP 138/80 | HR 74 | Temp 98.4°F | Ht 69.0 in | Wt 227.8 lb

## 2019-12-29 DIAGNOSIS — R042 Hemoptysis: Secondary | ICD-10-CM | POA: Diagnosis not present

## 2019-12-29 DIAGNOSIS — R0989 Other specified symptoms and signs involving the circulatory and respiratory systems: Secondary | ICD-10-CM

## 2019-12-29 DIAGNOSIS — R110 Nausea: Secondary | ICD-10-CM

## 2019-12-29 DIAGNOSIS — J4531 Mild persistent asthma with (acute) exacerbation: Secondary | ICD-10-CM | POA: Diagnosis not present

## 2019-12-29 DIAGNOSIS — R531 Weakness: Secondary | ICD-10-CM

## 2019-12-29 DIAGNOSIS — R05 Cough: Secondary | ICD-10-CM | POA: Diagnosis not present

## 2019-12-29 MED ORDER — AZITHROMYCIN 250 MG PO TABS: ORAL_TABLET | ORAL | 0 refills | Status: DC

## 2019-12-29 MED ORDER — ONDANSETRON 4 MG PO TBDP: 4.0000 mg | ORAL_TABLET | Freq: Three times a day (TID) | ORAL | 0 refills | Status: DC | PRN

## 2019-12-29 MED ORDER — BENZONATATE 100 MG PO CAPS: 100.0000 mg | ORAL_CAPSULE | Freq: Three times a day (TID) | ORAL | 0 refills | Status: DC | PRN

## 2019-12-29 NOTE — Progress Notes (Signed)
Patient ID: [name], female    DOB: 02/24/1956, 65 y.o.   MRN: [id number]  PCP: Midge Minium, MD  Chief Complaint  Patient presents with  . Cough  . Shortness of Breath    Subjective:  HPI  [name] is a 64 y.o. female presents to [facility] for evaluation of symptoms related to on-going sinus pressure, cough, and shortness of breath.   Pertinent medical history includes allergic rhinitis, asthma moderately controlled, and obesity.  Patient has not had a recent COVID-19 test or any known exposure to COVID-19.  She endorses ongoing symptoms of shortness of breath, cough, sinus pressure.  Of concern she is noted over the course of this week hemoptysis when coughing.  Today she endorses sputum production mixed with brown and red specks.  She has no known history of infectious lung processes such as TB or exposure.  She also has some chronic nausea since the onset of the symptoms and endorses just some generalized weakness.  She is having wheezing however this is mostly prominent in the mornings upon awakening occasionally at nighttime.  She is prescribed Qvar 2 puffs 2 times daily and has an emergency inhaler as well and uses as directed.  Unfortunately she is unable to tolerate prednisone derivatives due to history of bipolar disorder.  Review of Systems Pertinent negatives listed in HPI  Patient Active Problem List   Diagnosis Date Noted  . Allergic rhinitis due to pollen 12/13/2018  . Diet-controlled diabetes mellitus ([place]) 02/09/2018  . Statin intolerance 11/04/2017  . Physical exam 08/04/2017  . Family history of early CAD 03/10/2017  . HTN (hypertension) 02/15/2017  . Herniated nucleus pulposus, L5-S1 06/22/2014  . Asthma, moderate persistent 10/09/2013  . Fatigue 12/12/2012  . GERD (gastroesophageal reflux disease) 09/02/2012  . Vitamin D deficiency 09/15/2010  . DEPRESSIVE DISORDER 07/17/2010  . RHINITIS 06/14/2009  . MUSCLE WEAKNESS  (GENERALIZED) 01/03/2009  . Hyperlipidemia 10/10/2008  . BIPOLAR AFFECTIVE DISORDER 10/10/2008  . ADD 10/10/2008      Prior to Admission medications   Medication Sig Start Date End Date Taking? Authorizing Provider  albuterol (VENTOLIN HFA) 108 (90 Base) MCG/ACT inhaler Inhale 2 puffs into the lungs every 6 (six) hours as needed for wheezing or shortness of breath. 12/18/19  Yes Brunetta Jeans, PA-C  Alirocumab (PRALUENT) 150 MG/ML SOAJ Inject 150 mg into the skin every 14 (fourteen) days. 12/04/19  Yes Jettie Booze, MD  azelastine (ASTELIN) 0.1 % nasal spray Place 2 sprays into both nostrils 2 (two) times daily. Use in each nostril as directed 03/23/19  Yes Midge Minium, MD  beclomethasone (QVAR REDIHALER) 80 MCG/ACT inhaler Inhale 2 puffs into the lungs 2 (two) times daily. 09/16/19  Yes Midge Minium, MD  Cholecalciferol (VITAMIN D) 2000 units tablet Take 2,000 Units by mouth daily.   Yes [provider]  clonazePAM (KLONOPIN) 2 MG tablet 1 mg 4 (four) times daily as needed.  10/23/15  Yes [provider]  doxycycline (VIBRAMYCIN) 100 MG capsule Take 1 capsule (100 mg total) by mouth 2 (two) times daily. 12/18/19  Yes Brunetta Jeans, PA-C  estradiol (ESTRACE) 1 MG tablet Take 1 tablet (1 mg total) by mouth daily. 07/05/19  Yes Megan Salon, MD  fenofibrate (TRICOR) 145 MG tablet TAKE ONE TABLET BY MOUTH DAILY 11/16/19  Yes Midge Minium, MD  fexofenadine (ALLEGRA) 180 MG tablet Take 180 mg by mouth daily.   Yes [provider]  fluticasone (FLONASE) 50 MCG/ACT nasal spray PLACE 2 SPRAYS INTO BOTH NOSTRILS DAILY 11/16/19  Yes Midge Minium, MD  gabapentin (NEURONTIN) 600 MG tablet Take 600 mg by mouth 4 (four) times daily.    Yes [provider]  lamoTRIgine (LAMICTAL) 200 MG tablet Take 200 mg by mouth 2 (two) times daily.    Yes [provider]  LUMIGAN 0.01 % SOLN  10/19/19  Yes [provider]   methocarbamol (ROBAXIN) 500 MG tablet Take 1 tablet (500 mg total) by mouth every 8 (eight) hours as needed. 08/23/19  Yes Petrucelli, Samantha R, PA-C  metoprolol succinate (TOPROL-XL) 50 MG 24 hr tablet TAKE 1 TABLET BY MOUTH EVERY DAY *WITH OR IMMEDIATELY FOLLOWING A MEAL* 08/03/19  Yes Midge Minium, MD  Multiple Vitamin (MULTIVITAMIN WITH MINERALS) TABS tablet Take 1 tablet by mouth daily.   Yes [provider]  naproxen (NAPROSYN) 500 MG tablet Take 1 tablet (500 mg total) by mouth 2 (two) times daily. 08/23/19  Yes Petrucelli, Samantha R, PA-C  OLANZapine (ZYPREXA) 10 MG tablet Take 10 mg by mouth daily as needed.   Yes [provider]  omega-3 acid ethyl esters (LOVAZA) 1 g capsule Take 2 capsules (2 g total) by mouth 2 (two) times daily. 11/08/19  Yes Jettie Booze, MD  QUEtiapine (SEROQUEL XR) 400 MG 24 hr tablet Take one tablet every evening at bedtime 07/27/16  Yes [provider]  QUEtiapine (SEROQUEL) 50 MG tablet TK 1 T PO TID 07/25/16  Yes [provider]  venlafaxine (EFFEXOR-XR) 75 MG 24 hr capsule Take 225 mg by mouth daily.    Yes [provider]    Past Medical, Surgical Family and Social History reviewed and updated.    Objective:   Today's Vitals   12/29/19 1822  BP: 138/80  Pulse: 74  Temp: 98.4 F (36.9 C)  SpO2: 94%  Weight: 227 lb 12.8 oz (103.3 kg)  Height: 5\' 9"  (1.753 m)    Wt Readings from Last 3 Encounters:  12/29/19 227 lb 12.8 oz (103.3 kg)  11/13/19 229 lb 8 oz (104.1 kg)  08/08/19 227 lb (103 kg)    Physical Exam General appearance: Alert, stable, cooperative, obese  Head: Normocephalic, without obvious abnormality, atraumatic Respiratory: Respirations even and unlabored, positive cough (non-productive), decrease movement of air, rhonchi (upper right and left lung fields) normal respiratory rate Heart: rate and rhythm normal. No gallop or murmurs noted on exam  Abdomen: BS +, no  distention, no rebound tenderness, or no mass Extremities: No gross deformities Skin: Skin color, texture, turgor normal. No rashes seen  Psych: Appropriate mood and affect. Neurologic: Alert, oriented to person, place, and time, thought content appropriate.   Assessment & Plan:  1. Chest congestion, - DG Chest Portable 2 Views; Future - Novel Coronavirus, NAA ([facility])  2. Mild persistent asthma with acute exacerbation, - Novel Coronavirus, NAA ([facility])  3. Cough with hemoptysis, - Novel Coronavirus, NAA ([facility])  4. Nausea,  - Novel Coronavirus, NAA ([facility])  5. Generalized weakness, - Novel Coronavirus, NAA ([facility])  Patient presents with history of chronic asthma with an acute exacerbation of shortness of breath , cough, wheezing and hemoptysis.  Chest -ray negative for pneumonia. COVID-19 test pending. Will cover with Azithromycin for bronchitis. Symptomatic treatment prescribed for other symptoms. Strict follow-up precautions advised.   Meds ordered this encounter  Medications  . benzonatate (TESSALON) 100 MG capsule    Sig: Take 1-2 capsules (100-200 mg total) by  mouth 3 (three) times daily as needed for cough.    Dispense:  40 capsule    Refill:  0  . azithromycin (ZITHROMAX Z-PAK) 250 MG tablet    Sig: Azithromycin 250 mg PO  2 tabs x 1 dose, then 1 tab every day for x 4 days    Dispense:  6 tablet    Refill:  0  . ondansetron (ZOFRAN ODT) 4 MG disintegrating tablet    Sig: Take 1 tablet (4 mg total) by mouth every 8 (eight) hours as needed for nausea or vomiting.    Dispense:  20 tablet    Refill:  0      -The patient was given clear instructions to go to ER or return to medical center if symptoms do not improve, worsen or new problems develop. The patient verbalized understanding.     Molli Barrows, FNP-C [facility], PRN Provider  [facility]. [place], [place] [facility] Phone: [phone] Clinic Fax: [phone] Clinic Hours: 5:30 pm  -7:30 pm (Monday-Friday)

## 2019-12-29 NOTE — Patient Instructions (Signed)
  I will follow-up with you via Mychart once your chest x-ray results.  Cough, Adult A cough helps to clear your throat and lungs. A cough may be a sign of an illness or another medical condition. An acute cough may only last 2-3 weeks, while a chronic cough may last 8 or more weeks. Many things can cause a cough. They include:  Germs (viruses or bacteria) that attack the airway.  Breathing in things that bother (irritate) your lungs.  Allergies.  Asthma.  Mucus that runs down the back of your throat (postnasal drip).  Smoking.  Acid backing up from the stomach into the tube that moves food from the mouth to the stomach (gastroesophageal reflux).  Some medicines.  Lung problems.  Other medical conditions, such as heart failure or a blood clot in the lung (pulmonary embolism). Follow these instructions at home: Medicines  Take over-the-counter and prescription medicines only as told by your doctor.  Talk with your doctor before you take medicines that stop a cough (coughsuppressants). Lifestyle   Do not smoke, and try not to be around smoke. Do not use any products that contain nicotine or tobacco, such as cigarettes, e-cigarettes, and chewing tobacco. If you need help quitting, ask your doctor.  Drink enough fluid to keep your pee (urine) pale yellow.  Avoid caffeine.  Do not drink alcohol if your doctor tells you not to drink. General instructions   Watch for any changes in your cough. Tell your doctor about them.  Always cover your mouth when you cough.  Stay away from things that make you cough, such as perfume, candles, campfire smoke, or cleaning products.  If the air is dry, use a cool mist vaporizer or humidifier in your home.  If your cough is worse at night, try using extra pillows to raise your head up higher while you sleep.  Rest as needed.  Keep all follow-up visits as told by your doctor. This is important. Contact a doctor if:  You have new  symptoms.  You cough up pus.  Your cough does not get better after 2-3 weeks, or your cough gets worse.  Cough medicine does not help your cough and you are not sleeping well.  You have pain that gets worse or pain that is not helped with medicine.  You have a fever.  You are losing weight and you do not know why.  You have night sweats. Get help right away if:  You cough up blood.  You have trouble breathing.  Your heartbeat is very fast. These symptoms may be an emergency. Do not wait to see if the symptoms will go away. Get medical help right away. Call your local emergency services (911 in the [place] Do not drive yourself to the hospital. Summary  A cough helps to clear your throat and lungs. Many things can cause a cough.  Take over-the-counter and prescription medicines only as told by your doctor.  Always cover your mouth when you cough.  Contact a doctor if you have new symptoms or you have a cough that does not get better or gets worse. This information is not intended to replace advice given to you by your health care provider. Make sure you discuss any questions you have with your health care provider. Document Revised: 11/14/2018 Document Reviewed: 11/14/2018 Else[employer] Patient Education  [place].

## 2019-12-30 LAB — NOVEL CORONAVIRUS, NAA: SARS-CoV-2, NAA: NOT DETECTED

## 2020-01-04 DIAGNOSIS — R69 Illness, unspecified: Secondary | ICD-10-CM | POA: Diagnosis not present

## 2020-01-07 DIAGNOSIS — G4733 Obstructive sleep apnea (adult) (pediatric): Secondary | ICD-10-CM | POA: Diagnosis not present

## 2020-01-08 DIAGNOSIS — R69 Illness, unspecified: Secondary | ICD-10-CM | POA: Diagnosis not present

## 2020-01-15 NOTE — Progress Notes (Deleted)
Cardiology Office Note   Date:  01/15/2020   ID:  [name], DOB 04/26/1956, MRN [id number]  PCP:  Midge Minium, MD    No chief complaint on file.   hyperlipidemia Wt Readings from Last 3 Encounters:  12/29/19 227 lb 12.8 oz (103.3 kg)  11/13/19 229 lb 8 oz (104.1 kg)  08/08/19 227 lb (103 kg)       History of Present Illness: [name] is a 64 y.o. female  With HTN and hyperlipidemia.  TG have been as high as 983, treated with Gemfibrozil 600mg  twice daily and Fish oil 1000mg  daily.  Intolerances:  Zetia (ezetimibe) - GI discomfort and muscle pain Niaspan - intolerable flushing Atorvastatin - severe muscle pain Rosuvastatin 40mg  - severe muscle pain   She has had a h/o MVP for years.  She was treated with atenolol for many years.    She had some higher blood pressures and palpitations.  Her atenolol was switched to metoprolol and HCTZ was started for BP control.  SHe had some repratory problems in March.  SHe has had a lot of stress  No palpitations in the past few weeks.  She has been walking, but inconsistently.  She has lost weight since her illness in March.  BP has been checked at her friend's house.  It was 116/74.       Past Medical History:  Diagnosis Date  . Allergic rhinitis   . Anxiety   . Arthritis    R knee, Left Knee  . Asthma   . Bipolar disorder ([place])   . Cataract   . Complication of anesthesia    had bad anxiety after anesthesia  . Constipation   . Constipation, chronic   . Depression   . Diabetes mellitus without complication ([place])   . Dysrhythmia    Palpations occ, takes Tenormin  . GERD (gastroesophageal reflux disease)   . Heart murmur   . Hyperlipidemia   . Hypertension   . MVP (mitral valve prolapse)   . Urinary tract infection    frequent, see Urololgist 12/16/2010    Past Surgical History:  Procedure Laterality Date  . ABDOMINAL HYSTERECTOMY  2002   no oophorectomy  . BACK SURGERY  2001   L5 - S1    . CATARACT EXTRACTION    . NASAL SINUS SURGERY  01/2010   x2  . TONSILLECTOMY    . TOTAL KNEE ARTHROPLASTY  12/23/2011   Procedure: TOTAL KNEE ARTHROPLASTY;  Surgeon: Ninetta Lights, MD;  Location: [facility];  Service: Orthopedics;  Laterality: Right;  DR MURPHY WANTS 120 MINUTES FOR SURGERY  . TOTAL KNEE ARTHROPLASTY  08/31/2012   left knee  . TOTAL KNEE ARTHROPLASTY  08/31/2012   Procedure: TOTAL KNEE ARTHROPLASTY;  Surgeon: Ninetta Lights, MD;  Location: [facility];  Service: Orthopedics;  Laterality: Left;     Current Outpatient Medications  Medication Sig Dispense Refill  . albuterol (VENTOLIN HFA) 108 (90 Base) MCG/ACT inhaler Inhale 2 puffs into the lungs every 6 (six) hours as needed for wheezing or shortness of breath. 6.7 g 2  . Alirocumab (PRALUENT) 150 MG/ML SOAJ Inject 150 mg into the skin every 14 (fourteen) days. 2 pen 11  . azelastine (ASTELIN) 0.1 % nasal spray Place 2 sprays into both nostrils 2 (two) times daily. Use in each nostril as directed 30 mL 3  . azithromycin (ZITHROMAX Z-PAK) 250 MG tablet Azithromycin 250 mg PO  2 tabs x 1 dose,  then 1 tab every day for x 4 days 6 tablet 0  . beclomethasone (QVAR REDIHALER) 80 MCG/ACT inhaler Inhale 2 puffs into the lungs 2 (two) times daily. 10.6 g 3  . benzonatate (TESSALON) 100 MG capsule Take 1-2 capsules (100-200 mg total) by mouth 3 (three) times daily as needed for cough. 40 capsule 0  . Cholecalciferol (VITAMIN D) 2000 units tablet Take 2,000 Units by mouth daily.    . clonazePAM (KLONOPIN) 2 MG tablet 1 mg 4 (four) times daily as needed.   0  . doxycycline (VIBRAMYCIN) 100 MG capsule Take 1 capsule (100 mg total) by mouth 2 (two) times daily. 20 capsule 0  . estradiol (ESTRACE) 1 MG tablet Take 1 tablet (1 mg total) by mouth daily. 90 tablet 4  . fenofibrate (TRICOR) 145 MG tablet TAKE ONE TABLET BY MOUTH DAILY 30 tablet 4  . fexofenadine (ALLEGRA) 180 MG tablet Take 180 mg by mouth daily.    . fluticasone (FLONASE) 50  MCG/ACT nasal spray PLACE 2 SPRAYS INTO BOTH NOSTRILS DAILY 16 g 5  . gabapentin (NEURONTIN) 600 MG tablet Take 600 mg by mouth 4 (four) times daily.     Marland Kitchen lamoTRIgine (LAMICTAL) 200 MG tablet Take 200 mg by mouth 2 (two) times daily.     Marland Kitchen LUMIGAN 0.01 % SOLN     . methocarbamol (ROBAXIN) 500 MG tablet Take 1 tablet (500 mg total) by mouth every 8 (eight) hours as needed. 15 tablet 0  . metoprolol succinate (TOPROL-XL) 50 MG 24 hr tablet TAKE 1 TABLET BY MOUTH EVERY DAY *WITH OR IMMEDIATELY FOLLOWING A MEAL* 90 tablet 1  . Multiple Vitamin (MULTIVITAMIN WITH MINERALS) TABS tablet Take 1 tablet by mouth daily.    . naproxen (NAPROSYN) 500 MG tablet Take 1 tablet (500 mg total) by mouth 2 (two) times daily. 10 tablet 0  . OLANZapine (ZYPREXA) 10 MG tablet Take 10 mg by mouth daily as needed.    Marland Kitchen omega-3 acid ethyl esters (LOVAZA) 1 g capsule Take 2 capsules (2 g total) by mouth 2 (two) times daily. 120 capsule 1  . ondansetron (ZOFRAN ODT) 4 MG disintegrating tablet Take 1 tablet (4 mg total) by mouth every 8 (eight) hours as needed for nausea or vomiting. 20 tablet 0  . QUEtiapine (SEROQUEL XR) 400 MG 24 hr tablet Take one tablet every evening at bedtime  0  . QUEtiapine (SEROQUEL) 50 MG tablet TK 1 T PO TID  3  . venlafaxine (EFFEXOR-XR) 75 MG 24 hr capsule Take 225 mg by mouth daily.      No current facility-administered medications for this visit.    Allergies:   Nitrofurantoin monohyd macro, Statins, Sulfa antibiotics, Alprazolam, Amoxicillin, and Prednisone    Social History:  The patient  reports that she has never smoked. She has never used smokeless tobacco. She reports that she does not drink alcohol or use drugs.   Family History:  The patient's ***family history includes Coronary artery disease in her father; Heart attack (age of onset: 27) in her father; Heart disease in her father; Hyperlipidemia in her brother; Hypertension in her brother; Lung disease in her mother.    ROS:   Please see the history of present illness.   Otherwise, review of systems are positive for ***.   All other systems are reviewed and negative.    PHYSICAL EXAM: VS:  LMP 11/09/2000  , BMI There is no height or weight on file to calculate BMI. GEN: Well  nourished, well developed, in no acute distress  HEENT: normal  Neck: no JVD, carotid bruits, or masses Cardiac: ***RRR; no murmurs, rubs, or gallops,no edema  Respiratory:  clear to auscultation bilaterally, normal work of breathing GI: soft, nontender, nondistended, + BS MS: no deformity or atrophy  Skin: warm and dry, no rash Neuro:  Strength and sensation are intact Psych: euthymic mood, full affect   EKG:   The ekg ordered today demonstrates ***   Recent Labs: 12/14/2019: ALT 14; BUN 19; Creatinine, Ser 0.69; Hemoglobin 13.0; Platelets 208.0; Potassium 4.1; Sodium 139; TSH 2.21   Lipid Panel    Component Value Date/Time   CHOL 165 12/14/2019 0924   CHOL 163 11/14/2018 1447   CHOL 312 (H) 06/19/2015 0924   TRIG 153.0 (H) 12/14/2019 0924   TRIG 657 (H) 06/19/2015 0924   HDL 64.60 12/14/2019 0924   HDL 71 11/14/2018 1447   HDL 44 06/19/2015 0924   CHOLHDL 3 12/14/2019 0924   VLDL 30.6 12/14/2019 0924   LDLCALC 70 12/14/2019 0924   LDLCALC 66 11/14/2018 1447   LDLCALC Comment 06/19/2015 0924   LDLDIRECT 123.0 01/26/2019 1030     Other studies Reviewed: Additional studies/ records that were reviewed today with results demonstrating: ***.   ASSESSMENT AND PLAN:  1. Hyperlipidemia: 2. HTN: 3. DM:   Current medicines are reviewed at length with the patient today.  The patient concerns regarding her medicines were addressed.  The following changes have been made:  No change***  Labs/ tests ordered today include: *** No orders of the defined types were placed in this encounter.   Recommend 150 minutes/week of aerobic exercise Low fat, low carb, high fiber diet recommended  Disposition:   FU in  ***   Signed, Larae Grooms, MD  01/15/2020 4:25 PM    [facility] [address], [place], [place]  [address] Phone: [phone]; Fax: [phone]

## 2020-01-16 ENCOUNTER — Ambulatory Visit: Payer: Medicare HMO | Admitting: Interventional Cardiology

## 2020-01-18 DIAGNOSIS — R69 Illness, unspecified: Secondary | ICD-10-CM | POA: Diagnosis not present

## 2020-01-22 DIAGNOSIS — R69 Illness, unspecified: Secondary | ICD-10-CM | POA: Diagnosis not present

## 2020-01-30 ENCOUNTER — Telehealth: Payer: Self-pay | Admitting: Family Medicine

## 2020-01-30 DIAGNOSIS — G4733 Obstructive sleep apnea (adult) (pediatric): Secondary | ICD-10-CM

## 2020-01-30 NOTE — Telephone Encounter (Signed)
Ok for referral? Please advise on Dx?  

## 2020-01-30 NOTE — Telephone Encounter (Signed)
Pt called in asking for a new referral to be sent to [facility] Dr. Rexene Alberts. She states it has been over 3 years since she has been seen there and was told to re-establish she needs a referral.

## 2020-01-31 NOTE — Telephone Encounter (Signed)
Upon chart review, it appears Dr Rexene Alberts treats her OSA

## 2020-01-31 NOTE — Telephone Encounter (Signed)
Referral placed.

## 2020-02-09 ENCOUNTER — Other Ambulatory Visit: Payer: Self-pay | Admitting: Family Medicine

## 2020-02-13 ENCOUNTER — Ambulatory Visit: Payer: Medicare HMO | Admitting: Neurology

## 2020-02-13 ENCOUNTER — Encounter: Payer: Self-pay | Admitting: Neurology

## 2020-02-13 ENCOUNTER — Other Ambulatory Visit: Payer: Self-pay

## 2020-02-13 VITALS — BP 138/81 | HR 85 | Ht 69.0 in | Wt 229.0 lb

## 2020-02-13 DIAGNOSIS — H401131 Primary open-angle glaucoma, bilateral, mild stage: Secondary | ICD-10-CM | POA: Diagnosis not present

## 2020-02-13 DIAGNOSIS — E669 Obesity, unspecified: Secondary | ICD-10-CM

## 2020-02-13 DIAGNOSIS — G4733 Obstructive sleep apnea (adult) (pediatric): Secondary | ICD-10-CM

## 2020-02-13 DIAGNOSIS — G4719 Other hypersomnia: Secondary | ICD-10-CM | POA: Diagnosis not present

## 2020-02-13 DIAGNOSIS — Z9989 Dependence on other enabling machines and devices: Secondary | ICD-10-CM

## 2020-02-13 NOTE — Progress Notes (Signed)
Subjective:    Patient ID: [name] is a 64 y.o. female.  HPI     Star Age, MD, PhD [facility] [address], [address] [place], [place] [address]  Dear [name],   I saw your patient, [name], upon your kind request, in my sleep clinic today for evaluation of her OSA. The patient is unaccompanied today. As you know, [name] is a 64 year old right-handed woman with an underlying medical history of bipolar disorder, obesity, mitral valve prolapse, hyperlipidemia, allergic rhinitis, palpitations, constipation, heart murmur, chronic cough, reflux disease, anxiety, and low back pain status post back surgery in 2001, arthritis, status post left total knee arthroplasty and right total knee arthroplasty, tonsillectomy, and hysterectomy, and status post nasal surgeries twice, who reports Feeling sleepy during the day.  Of note, she is on multiple potentially sedating medications.  She is followed by psychiatry.  In particular, she is on high-dose clonazepam, 2 mg strength, half a pill up to 4 times a day, she is also on Seroquel long-acting and Seroquel immediate release, Effexor Exar high-dose, 225 mg daily, Zyprexa 10 mg strength, Robaxin, Lamictal, high-dose gabapentin 600 mg 4 times a day.  She reports that she gets sleepy while driving.  She has a friend that she likes to visit in [place] and she has felt like she is hypnotized while driving and has veered out of her lane.  She is strongly and repeatedly discouraged from driving while sleepy and to address her medication regimen with her psychiatrist as soon as possible.  In the meantime, I have strongly discouraged her from driving any longer distances, such as to [place].   I have followed her in the past for sleep apnea. She was last seen over 4 years ago.  I reviewed her CPAP compliance data from 01/12/2020 through 02/10/2020 which is a total of 30 days, during which time she used her machine 27 days  with percent used days greater than 4 hours at 87%, indicating very good compliance with an average usage of 8 hours and 28 minutes, residual AHI at goal at 3.8/h, leak on the high side with a 95th percentile at 28.4 L/min on a pressure of 11 cm with EPR of 3.  She reports that the machine is old And she would like to get a new machine.  According to our records, her set up date was 01/28/2015.  She is a non-smoker.  She drinks alcohol rarely.  She drinks caffeine in the form of coffee, 2 cups/day on average.  She gets her supplies from [facility].  Bedtime is around 11 PM or midnight and rise time around 8.  She denies any night to night nocturia or recurrent morning headaches.  She lives alone, she has a small dog in the household. Her Epworth sleepiness score is 6/24, fatigue severity score is 40/63.    Previously:   02/10/2016: I reviewed her CPAP compliance data from 12/13/2015 through 01/11/2016 which is a total of 30 days during which time she used her machine 17 days with percent used days greater than 4 hours at 53%, indicating suboptimal compliance with an average usage of 4 hours and 10 minutes only for all days. Residual AHI 1.9 per hour, leak acceptable with the 95th percentile at 15.2 L/m on a pressure of 11 cm with EPR of 3.   I reviewed her CPAP compliance data from 10/13/2015 through 11/11/2015 which is a total of 30 days during which time  she used her machine 21 days with percent used days greater than 4 hours at 63%, indicating suboptimal compliance with an average usage of 4 hours and 36 minutes, residual AHI 3 per hour, leak acceptable with the 95th percentile at 8.5 L/m on a pressure of 11 cm with EPR of 3.   I reviewed her CPAP compliance data from 09/01/2015 through 09/30/2015 which is a total of 30 days during which time she used her machine 23 days with percent used days greater than 4 hours at 67% only, indicating suboptimal compliance with an average usage of 4 hours and 55  minutes, residual AHI borderline at 5.1 per hour, leak acceptable with the 95th percentile at 10.4 L/m on a pressure of 11 cm with EPR of 3.     She reports that she needs supplies. She moved to a smaller townhome recently and lost her mask and other parts of her headgear. She noticed worsening of her daytime somnolence, mood irritability and memory issues since stopping CPAP for the past month or so. She is motivated to get back on treatment. She admits that she thought she would do okay without treatment. Weight has been fairly stable.    I saw her on saw her on 03/27/2015, at which time she reported feeling better. She was feeling better rested. She did have more stress and was worrying about her brother. She was motivated to try to lose weight and exercise more. Overall, she felt that she was doing better and her morning headaches had also improved as well as her nocturia. She was compliant with treatment.     I first met her on 09/13/2014 at the request of her primary care physician. At which time the patient reported excessive daytime somnolence and snoring. I invited her back for sleep study. She had a baseline sleep study, followed by a CPAP titration study and I went over her test results with her in detail today. Her baseline sleep study from 11/27/2014 showed a sleep efficiency of 77.7%. Sleep latency was prolonged. Wake after sleep onset was 39.5 minutes with mild to moderate sleep fragmentation. She had an increased percentage of stage I and stage II sleep, absence of slow-wave sleep and absence of REM sleep. She had moderate to loud snoring. Total AHI was 5.8 per hour, rising to 15.8 per hour in the supine position. Average oxygen saturation was only 90%. Nadir was 77%. Based on her significant oxygen desaturations I invited her back for CPAP titration study. Of note, the absence of REM sleep with most likely have underestimated her obstructive sleep apnea. Her CPAP titration study from  01/08/2015 showed a sleep efficiency of 93.6%, latency to sleep was 0 minutes and wake after sleep onset was 33.5 minutes with mild sleep fragmentation noted. She had a normal arousal index. She had a absence of slow-wave sleep and a normal percentage of REM sleep with a long REM latency. She had no significant PLMS. Average oxygen saturation was 92%, nadir was 84%. She did have evidence of central apneas upon starting with CPAP. CPAP was titrated from 5-11 cm. I prescribed CPAP therapy for home use. I did suggest that she should have a pulse oximetry test while on CPAP once established on treatment.   I reviewed her CPAP compliance data from 02/23/2015 through 03/24/2015 which is a total of 30 days during which time she used her machine 25 days with percent used days greater than 4 hours at 77%, indicating good compliance with an average  usage of 6 hours and 11 minutes. Residual AHI acceptable at 3.3 per hour and 95th percentile of leak at 12 L/m on a pressure of 11 cm with EPR of 3.   I reviewed her CPAP compliance data from 02/12/2015 through 03/13/2015 which is a total of 30 days during which time she used her machine only 22 days with percent used days greater than 4 hours at 73%, indicating adequate compliance with an average usage of 5 hours and 41 minutes. Residual AHI acceptable at 3.4 per hour and 95th percentile of leak at 14.7 L/m on a pressure of 11 cm with EPR of 3.   She goes to [facility] for her mood d/o. She is on multiple sedating medications, but has been told she snores. She has snored for 4 years or so. She does not wake up rested. She wakes up with a headache. She denies nocturia. She has no FHx of OSA. She has had intermittent RLS symptoms and twitches her legs in her sleep. She has dozed off driving more than once and veered out of the lane, but thankfully has had no MVA.   She drinks 2 cups of coffee per day. She does not smoke and very rarely drinks alcohol.    She has 2 cats, who occasionally are in the bed, but don't bother her. She does not watch TV in bed. She mostly sleeps alone. She is divorced and has no children.    Her typical bedtime is reported to be around 10 PM and usual wake time is around 7 AM. Sleep onset typically occurs within minutes. She reports feeling poorly rested upon awakening. She wakes up on an average 3 times in the middle of the night and has to go to the bathroom 0 to 1 times on a typical night.    She reports excessive daytime somnolence (EDS) and Her Epworth Sleepiness Score (ESS) is 9/24 today.   The patient has not been taking a planned nap.   Snoring is reportedly marked, and associated with choking sounds and witnessed apneas. The patient denies a sense of choking or strangling feeling. The patient has noted any RLS symptoms and is known to kick while asleep or before falling asleep. There is no family history of RLS or OSA.   She is a restless sleeper and in the morning, the bed is quite disheveled.    She denies cataplexy, sleep paralysis, hypnagogic or hypnopompic hallucinations, or sleep attacks. She does not report any vivid dreams, nightmares, dream enactments, or parasomnias, such as sleep talking or sleep walking. The patient has not had a sleep study or a home sleep test. Her bedroom is usually dark and cool. She uses a box fan.      Her Past Medical History Is Significant For: Past Medical History:  Diagnosis Date  . Allergic rhinitis   . Anxiety   . Arthritis    R knee, Left Knee  . Asthma   . Bipolar disorder ([place])   . Cataract   . Complication of anesthesia    had bad anxiety after anesthesia  . Constipation   . Constipation, chronic   . Depression   . Diabetes mellitus without complication ([place])   . Dysrhythmia    Palpations occ, takes Tenormin  . GERD (gastroesophageal reflux disease)   . Heart murmur   . Hyperlipidemia   . Hypertension   . MVP (mitral valve prolapse)   . Urinary tract  infection    frequent, see  Urololgist 12/16/2010    Her Past Surgical History Is Significant For: Past Surgical History:  Procedure Laterality Date  . ABDOMINAL HYSTERECTOMY  2002   no oophorectomy  . BACK SURGERY  2001   L5 - S1  . CATARACT EXTRACTION    . NASAL SINUS SURGERY  01/2010   x2  . TONSILLECTOMY    . TOTAL KNEE ARTHROPLASTY  12/23/2011   Procedure: TOTAL KNEE ARTHROPLASTY;  Surgeon: Ninetta Lights, MD;  Location: [facility];  Service: Orthopedics;  Laterality: Right;  DR MURPHY WANTS 120 MINUTES FOR SURGERY  . TOTAL KNEE ARTHROPLASTY  08/31/2012   left knee  . TOTAL KNEE ARTHROPLASTY  08/31/2012   Procedure: TOTAL KNEE ARTHROPLASTY;  Surgeon: Ninetta Lights, MD;  Location: [facility];  Service: Orthopedics;  Laterality: Left;    Her Family History Is Significant For: Family History  Problem Relation Age of Onset  . Coronary artery disease Father   . Heart attack Father 57  . Heart disease Father   . Lung disease Mother        Mycobacterium avium intracellulare  . Hypertension Brother   . Hyperlipidemia Brother   . Anesthesia problems Neg Hx     Her Social History Is Significant For: Social History   Socioeconomic History  . Marital status: Single    Spouse name: Not on file  . Number of children: 0  . Years of education: 33  . Highest education level: Not on file  Occupational History  . Occupation: retired//disability  Tobacco Use  . Smoking status: Never Smoker  . Smokeless tobacco: Never Used  Substance and Sexual Activity  . Alcohol use: No    Alcohol/week: 0.0 standard drinks    Comment: Occasional glass of wine/"hardly ever"  . Drug use: No  . Sexual activity: Not Currently    Birth control/protection: Abstinence  Other Topics Concern  . Not on file  Social History Narrative   Lives alone with 2 cats. Has pet sitting business.   2 cups of coffee a day, Rare soda    Social Determinants of Health   Financial Resource Strain:   . Difficulty of  Paying Living Expenses:   Food Insecurity:   . Worried About Charity fundraiser in the Last Year:   . Arboriculturist in the Last Year:   Transportation Needs:   . Film/video editor (Medical):   Marland Kitchen Lack of Transportation (Non-Medical):   Physical Activity:   . Days of Exercise per Week:   . Minutes of Exercise per Session:   Stress:   . Feeling of Stress :   Social Connections:   . Frequency of Communication with Friends and Family:   . Frequency of Social Gatherings with Friends and Family:   . Attends Religious Services:   . Active Member of Clubs or Organizations:   . Attends Archivist Meetings:   Marland Kitchen Marital Status:     Her Allergies Are:  Allergies  Allergen Reactions  . Nitrofurantoin Monohyd Macro Nausea Only    Severe headache  . Statins Other (See Comments)    Severe weakness, pain  . Sulfa Antibiotics Nausea And Vomiting  . Alprazolam Anxiety and Other (See Comments)    Hyper   . Amoxicillin Rash  . Prednisone Anxiety  :   Her Current Medications Are:  Outpatient Encounter Medications as of 02/13/2020  Medication Sig  . albuterol (VENTOLIN HFA) 108 (90 Base) MCG/ACT inhaler Inhale 2 puffs into the  lungs every 6 (six) hours as needed for wheezing or shortness of breath.  . Alirocumab (PRALUENT) 150 MG/ML SOAJ Inject 150 mg into the skin every 14 (fourteen) days.  Marland Kitchen azelastine (ASTELIN) 0.1 % nasal spray Place 2 sprays into both nostrils 2 (two) times daily. Use in each nostril as directed  . azithromycin (ZITHROMAX Z-PAK) 250 MG tablet Azithromycin 250 mg PO  2 tabs x 1 dose, then 1 tab every day for x 4 days  . beclomethasone (QVAR REDIHALER) 80 MCG/ACT inhaler Inhale 2 puffs into the lungs 2 (two) times daily.  . benzonatate (TESSALON) 100 MG capsule Take 1-2 capsules (100-200 mg total) by mouth 3 (three) times daily as needed for cough.  . Cholecalciferol (VITAMIN D) 2000 units tablet Take 2,000 Units by mouth daily.  . clonazePAM (KLONOPIN) 2 MG  tablet 1 mg 4 (four) times daily as needed.   . doxycycline (VIBRAMYCIN) 100 MG capsule Take 1 capsule (100 mg total) by mouth 2 (two) times daily.  Marland Kitchen estradiol (ESTRACE) 1 MG tablet Take 1 tablet (1 mg total) by mouth daily.  . fenofibrate (TRICOR) 145 MG tablet TAKE ONE TABLET BY MOUTH DAILY  . fexofenadine (ALLEGRA) 180 MG tablet Take 180 mg by mouth daily.  . fluticasone (FLONASE) 50 MCG/ACT nasal spray PLACE 2 SPRAYS INTO BOTH NOSTRILS DAILY  . gabapentin (NEURONTIN) 600 MG tablet Take 600 mg by mouth 4 (four) times daily.   Marland Kitchen lamoTRIgine (LAMICTAL) 200 MG tablet Take 200 mg by mouth 2 (two) times daily.   Marland Kitchen LUMIGAN 0.01 % SOLN   . methocarbamol (ROBAXIN) 500 MG tablet Take 1 tablet (500 mg total) by mouth every 8 (eight) hours as needed.  . metoprolol succinate (TOPROL-XL) 50 MG 24 hr tablet TAKE ONE TABLET BY MOUTH DAILY WITH A MEAL  . Multiple Vitamin (MULTIVITAMIN WITH MINERALS) TABS tablet Take 1 tablet by mouth daily.  . naproxen (NAPROSYN) 500 MG tablet Take 1 tablet (500 mg total) by mouth 2 (two) times daily.  Marland Kitchen OLANZapine (ZYPREXA) 10 MG tablet Take 10 mg by mouth daily as needed.  Marland Kitchen omega-3 acid ethyl esters (LOVAZA) 1 g capsule Take 2 capsules (2 g total) by mouth 2 (two) times daily.  . ondansetron (ZOFRAN ODT) 4 MG disintegrating tablet Take 1 tablet (4 mg total) by mouth every 8 (eight) hours as needed for nausea or vomiting.  Marland Kitchen QUEtiapine (SEROQUEL XR) 400 MG 24 hr tablet Take one tablet every evening at bedtime  . QUEtiapine (SEROQUEL) 50 MG tablet TK 1 T PO TID  . venlafaxine (EFFEXOR-XR) 75 MG 24 hr capsule Take 225 mg by mouth daily.    No facility-administered encounter medications on file as of 02/13/2020.  :  Review of Systems:  Out of a complete 14 point review of systems, all are reviewed and negative with the exception of these symptoms as listed below:  Review of Systems  Neurological:       Pt presents today to discuss her sleep. Pt has been using her cpap  but is interested in a new one. Pt has been using [facility].  Epworth Sleepiness Scale 0= would never doze 1= slight chance of dozing 2= moderate chance of dozing 3= high chance of dozing  Sitting and reading: 0 Watching TV: 2 Sitting inactive in a public place (ex. Theater or meeting): 1 As a passenger in a car for an hour without a break: 2 Lying down to rest in the afternoon: 0 Sitting and talking to someone:  0 Sitting quietly after lunch (no alcohol): 0 In a car, while stopped in traffic: 1 Total: 6     Objective:  Neurological Exam  Physical Exam Physical Examination:   Vitals:   02/13/20 1316  BP: 138/81  Pulse: 85   General Examination: The patient is a very pleasant 64 y.o. female in no acute distress. She appears well-developed and well-nourished and well groomed.   HEENT: Normocephalic, atraumatic, pupils are equal, round and reactive to light extraocular tracking is good without limitation to gaze excursion or nystagmus noted. Normal smooth pursuit is noted. Hearing is grossly intact. Face is symmetric with normal facial animation and normal facial sensation. Speech is clear with no dysarthria noted. There is no hypophonia. There is no lip, neck/head, jaw or voice tremor. Neck is supple with full range of passive and active motion. There are no carotid bruits on auscultation. Oropharynx exam reveals: severe mouth dryness, adequate dental hygiene and mild airway crowding. Tongue protrudes centrally and palate elevates symmetrically. Tonsils are absent. Neck size is 16 inches.   Chest: Clear to auscultation without wheezing, rhonchi or crackles noted.  Heart: S1+S2+0, regular and normal without murmurs, rubs or gallops noted.   Abdomen: Soft, non-tender and non-distended with normal bowel sounds appreciated on auscultation.  Extremities: There is no pitting edema in the distal lower extremities bilaterally.   Skin: Warm and dry without trophic changes  noted.  Musculoskeletal: exam reveals no obvious joint deformities, tenderness or joint swelling or erythema.   Neurologically:  Mental status: The patient is awake, alert and oriented in all 4 spheres. Her immediate and remote memory, attention, language skills and fund of knowledge are appropriate. There is no evidence of aphasia, agnosia, apraxia or anomia. Speech is clear with normal prosody and enunciation. Thought process is linear. Mood is normal and affect is normal.  Cranial nerves II - XII are as described above under HEENT exam.  Motor exam: Normal bulk, strength and tone is noted. There is no drift, or tremor. Fine motor skills and coordination: grossly intact.  Cerebellar testing: No dysmetria or intention tremor.  Sensory exam: intact to light touch.  Gait, station and balance: She stands easily. No veering to one side is noted. No leaning to one side is noted. Posture is age-appropriate and stance is narrow based. Gait shows normal stride length and normal pace.    Assessment and Plan:   In summary, Dynasti Ovitt is a very pleasant 64 year old female with an underlying medical history of mood disorder, including bipolar disease obesity, mitral valve prolapse, hyperlipidemia, allergic rhinitis, palpitations, constipation, heart murmur, chronic cough, reflux disease, anxiety, and low back pain status post back surgery in 2001, arthritis, status post left total knee arthroplasty and right total knee arthroplasty, tonsillectomy, and hysterectomy, and status post nasal surgeries, who presents for reevaluation of her obstructive sleep apnea.  She has been on CPAP therapy.  She is currently compliant with her machine and commended for this.  However, she indicates severe drowsiness and sleepiness while driving.  She is strongly discouraged from driving while feeling sleepy.  She is in particular advised not to drive any longer distances.  She does not indicate significant daytime somnolence  per Epworth sleepiness score.  Nevertheless, she is advised regarding the dangers of driving when sleepy.  She is on several potentially sedating medications.  She is advised to talk to her prescribing psychiatrist or PA regarding her medication management.  She has a CPAP machine that is about 63  years old.  She should be eligible for a new machine.  I suggested we proceed with a nocturnal polysomnogram to update her diagnosis and based on the test results I will prescribe new equipment, most likely an AutoPap machine.  She is encouraged to continue to work on weight loss.  She reports that she was able to lose about 50 pounds but gained most of it back.   I plan to see her back after sleep study testing.  I answered all her questions today and she was in agreement.   Thank you very much for allowing me to participate in the care of this nice patient. If I can be of any further assistance to you please do not hesitate to call me at [phone].  Sincerely,   Star Age, MD, PhD

## 2020-02-13 NOTE — Patient Instructions (Signed)
Thank you for choosing [facility] for your sleep related care! It was nice to meet you today! I appreciate that you entrust me with your sleep related healthcare concerns. I hope, I was able to address at least some of your concerns today, and that I can help you feel reassured and also get better.    Here is what we discussed today and what we came up with as our plan for you:    Based on your symptoms and your exam I believe you are still at risk for obstructive sleep apnea and would benefit from reevaluation as it has been over 5 years and you need new supplies and an updated machine. Therefore, I think we should proceed with a sleep study to determine how severe your sleep apnea is. If you have more than mild OSA, I want you to consider ongoing treatment with CPAP. Please remember, the risks and ramifications of moderate to severe obstructive sleep apnea or OSA are: Cardiovascular disease, including congestive heart failure, stroke, difficult to control hypertension, arrhythmias, and even type 2 diabetes has been linked to untreated OSA. Sleep apnea causes disruption of sleep and sleep deprivation in most cases, which, in turn, can cause recurrent headaches, problems with memory, mood, concentration, focus, and vigilance. Most people with untreated sleep apnea report excessive daytime sleepiness, which can affect their ability to drive. Please do not drive if you feel sleepy.   You indicate sleepiness while driving.  Please do not drive when you feel sleepy, pull over if you feel sleepy while driving and do not drive any longer distances such as to [place] as you have become drowsy while driving and veered out of your lane.  I will likely see you back after your sleep study to go over the test results and where to go from there. We will call you after your sleep study to advise about the results (most likely, you will hear from [place], my nurse) and to set up an appointment at the  time, as necessary.    Our sleep lab administrative assistant will call you to schedule your sleep study. If you don't hear back from her by about 2 weeks from now, please feel free to call her at [phone]. You can leave a message with your phone number and concerns, if you get the voicemail box. She will call back as soon as possible.

## 2020-02-14 ENCOUNTER — Telehealth: Payer: Self-pay | Admitting: Family Medicine

## 2020-02-14 ENCOUNTER — Telehealth: Payer: Self-pay

## 2020-02-14 DIAGNOSIS — R69 Illness, unspecified: Secondary | ICD-10-CM | POA: Diagnosis not present

## 2020-02-14 DIAGNOSIS — G4719 Other hypersomnia: Secondary | ICD-10-CM

## 2020-02-14 DIAGNOSIS — E669 Obesity, unspecified: Secondary | ICD-10-CM

## 2020-02-14 DIAGNOSIS — G4733 Obstructive sleep apnea (adult) (pediatric): Secondary | ICD-10-CM

## 2020-02-14 NOTE — Telephone Encounter (Signed)
[employer] denied in lab sleep study for new equipment due to she is compliant with her machine. Need HST order. May can get authorization for cpap titration depending on results of HST.

## 2020-02-14 NOTE — Progress Notes (Signed)
  Chronic Care Management   Note  02/14/2020 Name: [name] MRN: [id number] DOB: 04-12-1956  [name] is a 63 y.o. year old female who is a primary care patient of Tabori, Aundra Millet, MD. I reached out to [employer] by phone today in response to a referral sent by [name]'s PCP, Midge Minium, MD.   [name] was given information about Chronic Care Management services today including:  1. CCM service includes personalized support from designated clinical staff supervised by her physician, including individualized plan of care and coordination with other care providers 2. 24/7 contact phone numbers for assistance for urgent and routine care needs. 3. Service will only be billed when office clinical staff spend 20 minutes or more in a month to coordinate care. 4. Only one practitioner may furnish and bill the service in a calendar month. 5. The patient may stop CCM services at any time (effective at the end of the month) by phone call to the office staff.   Patient agreed to services and verbal consent obtained.   Follow up plan:   Earney [place] [employer] Scheduler

## 2020-02-14 NOTE — Telephone Encounter (Signed)
Order placed for HST 

## 2020-02-19 NOTE — Progress Notes (Signed)
Cardiology Office Note   Date:  02/20/2020   ID:  [name], DOB [birth year], MRN [id number]  PCP:  Midge Minium, MD    No chief complaint on file.  hyperlipidemia  Wt Readings from Last 3 Encounters:  02/20/20 227 lb 12.8 oz (103.3 kg)  02/13/20 229 lb (103.9 kg)  12/29/19 227 lb 12.8 oz (103.3 kg)       History of Present Illness: [name] is a 64 y.o. female  has had a h/o MVP for years.  She was treated with atenolol for many years.    She had some higher blood pressures and palpitations.  Her atenolol was switched to metoprolol and HCTZ was started for BP control.   She has had hyperlipidemia treated with a PCSK9 inhibitor. Father with an MI at an early age.   Denies : Chest pain. Dizziness. Leg edema. Nitroglycerin use. Orthopnea. Palpitations. Paroxysmal nocturnal dyspnea. Shortness of breath. Syncope.   She does walk.  No sx with exertion. She does note that her stamina is decreased.  She is walking the dog.    Awaiting second dose of vaccine.   Past Medical History:  Diagnosis Date  . Allergic rhinitis   . Anxiety   . Arthritis    R knee, Left Knee  . Asthma   . Bipolar disorder ([place])   . Cataract   . Complication of anesthesia    had bad anxiety after anesthesia  . Constipation   . Constipation, chronic   . Depression   . Diabetes mellitus without complication ([place])   . Dysrhythmia    Palpations occ, takes Tenormin  . GERD (gastroesophageal reflux disease)   . Heart murmur   . Hyperlipidemia   . Hypertension   . MVP (mitral valve prolapse)   . Urinary tract infection    frequent, see Urololgist 12/16/2010    Past Surgical History:  Procedure Laterality Date  . ABDOMINAL HYSTERECTOMY  2002   no oophorectomy  . BACK SURGERY  2001   L5 - S1  . CATARACT EXTRACTION    . NASAL SINUS SURGERY  01/2010   x2  . TONSILLECTOMY    . TOTAL KNEE ARTHROPLASTY  12/23/2011   Procedure: TOTAL KNEE ARTHROPLASTY;  Surgeon: Ninetta Lights,  MD;  Location: [facility];  Service: Orthopedics;  Laterality: Right;  DR MURPHY WANTS 120 MINUTES FOR SURGERY  . TOTAL KNEE ARTHROPLASTY  08/31/2012   left knee  . TOTAL KNEE ARTHROPLASTY  08/31/2012   Procedure: TOTAL KNEE ARTHROPLASTY;  Surgeon: Ninetta Lights, MD;  Location: [facility];  Service: Orthopedics;  Laterality: Left;     Current Outpatient Medications  Medication Sig Dispense Refill  . albuterol (VENTOLIN HFA) 108 (90 Base) MCG/ACT inhaler Inhale 2 puffs into the lungs every 6 (six) hours as needed for wheezing or shortness of breath. 6.7 g 2  . Alirocumab (PRALUENT) 150 MG/ML SOAJ Inject 150 mg into the skin every 14 (fourteen) days. 2 pen 11  . azelastine (ASTELIN) 0.1 % nasal spray Place 2 sprays into both nostrils 2 (two) times daily. Use in each nostril as directed 30 mL 3  . azithromycin (ZITHROMAX Z-PAK) 250 MG tablet Azithromycin 250 mg PO  2 tabs x 1 dose, then 1 tab every day for x 4 days 6 tablet 0  . beclomethasone (QVAR REDIHALER) 80 MCG/ACT inhaler Inhale 2 puffs into the lungs 2 (two) times daily. 10.6 g 3  . benzonatate (TESSALON) 100 MG capsule  Take 1-2 capsules (100-200 mg total) by mouth 3 (three) times daily as needed for cough. 40 capsule 0  . Cholecalciferol (VITAMIN D) 2000 units tablet Take 2,000 Units by mouth daily.    . clonazePAM (KLONOPIN) 2 MG tablet 1 mg 4 (four) times daily as needed.   0  . doxycycline (VIBRAMYCIN) 100 MG capsule Take 1 capsule (100 mg total) by mouth 2 (two) times daily. 20 capsule 0  . estradiol (ESTRACE) 1 MG tablet Take 1 tablet (1 mg total) by mouth daily. 90 tablet 4  . fenofibrate (TRICOR) 145 MG tablet TAKE ONE TABLET BY MOUTH DAILY 30 tablet 4  . fexofenadine (ALLEGRA) 180 MG tablet Take 180 mg by mouth daily.    . fluticasone (FLONASE) 50 MCG/ACT nasal spray PLACE 2 SPRAYS INTO BOTH NOSTRILS DAILY 16 g 5  . gabapentin (NEURONTIN) 600 MG tablet Take 600 mg by mouth 4 (four) times daily.     Marland Kitchen lamoTRIgine (LAMICTAL) 200 MG tablet  Take 200 mg by mouth 2 (two) times daily.     Marland Kitchen LUMIGAN 0.01 % SOLN     . methocarbamol (ROBAXIN) 500 MG tablet Take 1 tablet (500 mg total) by mouth every 8 (eight) hours as needed. 15 tablet 0  . metoprolol succinate (TOPROL-XL) 50 MG 24 hr tablet TAKE ONE TABLET BY MOUTH DAILY WITH A MEAL 90 tablet 0  . Multiple Vitamin (MULTIVITAMIN WITH MINERALS) TABS tablet Take 1 tablet by mouth daily.    . naproxen (NAPROSYN) 500 MG tablet Take 1 tablet (500 mg total) by mouth 2 (two) times daily. 10 tablet 0  . OLANZapine (ZYPREXA) 10 MG tablet Take 10 mg by mouth daily as needed.    Marland Kitchen omega-3 acid ethyl esters (LOVAZA) 1 g capsule Take 2 capsules (2 g total) by mouth 2 (two) times daily. 120 capsule 1  . ondansetron (ZOFRAN ODT) 4 MG disintegrating tablet Take 1 tablet (4 mg total) by mouth every 8 (eight) hours as needed for nausea or vomiting. 20 tablet 0  . QUEtiapine (SEROQUEL XR) 400 MG 24 hr tablet Take one tablet every evening at bedtime  0  . QUEtiapine (SEROQUEL) 50 MG tablet TK 1 T PO TID  3  . venlafaxine (EFFEXOR-XR) 75 MG 24 hr capsule Take 225 mg by mouth daily.      No current facility-administered medications for this visit.    Allergies:   Nitrofurantoin monohyd macro, Statins, Sulfa antibiotics, Alprazolam, Amoxicillin, and Prednisone    Social History:  The patient  reports that she has never smoked. She has never used smokeless tobacco. She reports that she does not drink alcohol or use drugs.   Family History:  The patient's \ family history includes Coronary artery disease in her father; Heart attack (age of onset: 2) in her father; Heart disease in her father; Hyperlipidemia in her brother; Hypertension in her brother; Lung disease in her mother.    ROS:  Please see the history of present illness.   Otherwise, review of systems are positive for decreased stamina.   All other systems are reviewed and negative.    PHYSICAL EXAM: VS:  BP 116/74   Pulse 70   Ht 5\' 9"  (1.753  m)   Wt 227 lb 12.8 oz (103.3 kg)   LMP 11/09/2000   SpO2 94%   BMI 33.64 kg/m  , BMI Body mass index is 33.64 kg/m. GEN: Well nourished, well developed, in no acute distress  HEENT: normal  Neck: no JVD,  carotid bruits, or masses Cardiac: RRR; no murmurs, rubs, or gallops,no edema  Respiratory:  clear to auscultation bilaterally, normal work of breathing GI: soft, nontender, nondistended, + BS MS: no deformity or atrophy  Skin: warm and dry, no rash Neuro:  Strength and sensation are intact Psych: euthymic mood, full affect   EKG:   The ekg ordered today demonstrates NSR, no ST changes   Recent Labs: 12/14/2019: ALT 14; BUN 19; Creatinine, Ser 0.69; Hemoglobin 13.0; Platelets 208.0; Potassium 4.1; Sodium 139; TSH 2.21   Lipid Panel    Component Value Date/Time   CHOL 165 12/14/2019 0924   CHOL 163 11/14/2018 1447   CHOL 312 (H) 06/19/2015 0924   TRIG 153.0 (H) 12/14/2019 0924   TRIG 657 (H) 06/19/2015 0924   HDL 64.60 12/14/2019 0924   HDL 71 11/14/2018 1447   HDL 44 06/19/2015 0924   CHOLHDL 3 12/14/2019 0924   VLDL 30.6 12/14/2019 0924   LDLCALC 70 12/14/2019 0924   LDLCALC 66 11/14/2018 1447   LDLCALC Comment 06/19/2015 0924   LDLDIRECT 123.0 01/26/2019 1030     Other studies Reviewed: Additional studies/ records that were reviewed today with results demonstrating: PMD labs reviewed.   ASSESSMENT AND PLAN:  1. Hyperlipidemia: She was on REpatha but this was changed to Praluent due to insurance.  Repeat lipids in August. 2. MVP: No sx of palpitations.  3. HTN: The current medical regimen is effective;  continue present plan and medications.   4. Family h/o early CAD: Father with MI at an early age.  No change in family history.  Whole food, plant based diet recommended.    Current medicines are reviewed at length with the patient today.  The patient concerns regarding her medicines were addressed.  The following changes have been made:  No change  Labs/  tests ordered today include:  No orders of the defined types were placed in this encounter.   Recommend 150 minutes/week of aerobic exercise Low fat, low carb, high fiber diet recommended  Disposition:   FU in 1 year   Signed, Larae Grooms, MD  02/20/2020 10:50 AM    [address], [place], [place]  [address] Phone: [phone]; Fax: [phone]

## 2020-02-20 ENCOUNTER — Encounter: Payer: Self-pay | Admitting: Interventional Cardiology

## 2020-02-20 ENCOUNTER — Ambulatory Visit: Payer: Medicare HMO | Admitting: Interventional Cardiology

## 2020-02-20 ENCOUNTER — Other Ambulatory Visit: Payer: Self-pay

## 2020-02-20 VITALS — BP 116/74 | HR 70 | Ht 69.0 in | Wt 227.8 lb

## 2020-02-20 DIAGNOSIS — E782 Mixed hyperlipidemia: Secondary | ICD-10-CM

## 2020-02-20 DIAGNOSIS — Z8249 Family history of ischemic heart disease and other diseases of the circulatory system: Secondary | ICD-10-CM

## 2020-02-20 DIAGNOSIS — I10 Essential (primary) hypertension: Secondary | ICD-10-CM

## 2020-02-20 DIAGNOSIS — I341 Nonrheumatic mitral (valve) prolapse: Secondary | ICD-10-CM

## 2020-02-20 MED ORDER — OMEGA-3-ACID ETHYL ESTERS 1 G PO CAPS: 2.0000 | ORAL_CAPSULE | Freq: Two times a day (BID) | ORAL | 3 refills | Status: DC

## 2020-02-20 NOTE — Patient Instructions (Signed)
Medication Instructions:  Your physician recommends that you continue on your current medications as directed. Please refer to the Current Medication list given to you today.  *If you need a refill on your cardiac medications before your next appointment, please call your pharmacy*   Lab Work: None ordered  If you have labs (blood work) drawn today and your tests are completely normal, you will receive your results only by: . MyChart Message (if you have MyChart) OR . A paper copy in the mail If you have any lab test that is abnormal or we need to change your treatment, we will call you to review the results.   Testing/Procedures: None ordered   Follow-Up: At [facility], you and your health needs are our priority.  As part of our continuing mission to provide you with exceptional heart care, we have created designated Provider Care Teams.  These Care Teams include your primary Cardiologist (physician) and Advanced Practice Providers (APPs -  Physician Assistants and Nurse Practitioners) who all work together to provide you with the care you need, when you need it.  We recommend signing up for the patient portal called "MyChart".  Sign up information is provided on this After Visit Summary.  MyChart is used to connect with patients for Virtual Visits (Telemedicine).  Patients are able to view lab/test results, encounter notes, upcoming appointments, etc.  Non-urgent messages can be sent to your provider as well.   To learn more about what you can do with MyChart, go to [email or web address].    Your next appointment:   12 month(s)  The format for your next appointment:   In Person  Provider:   You may see Jay Varanasi, MD or one of the following Advanced Practice Providers on your designated Care Team:    Dayna Dunn, PA-C  Michele Lenze, PA-C    Other Instructions   

## 2020-02-21 DIAGNOSIS — R69 Illness, unspecified: Secondary | ICD-10-CM | POA: Diagnosis not present

## 2020-02-23 ENCOUNTER — Other Ambulatory Visit: Payer: Self-pay | Admitting: General Practice

## 2020-02-23 DIAGNOSIS — E119 Type 2 diabetes mellitus without complications: Secondary | ICD-10-CM

## 2020-02-23 DIAGNOSIS — E782 Mixed hyperlipidemia: Secondary | ICD-10-CM

## 2020-02-23 DIAGNOSIS — I10 Essential (primary) hypertension: Secondary | ICD-10-CM

## 2020-02-28 DIAGNOSIS — R69 Illness, unspecified: Secondary | ICD-10-CM | POA: Diagnosis not present

## 2020-02-29 ENCOUNTER — Ambulatory Visit: Payer: Medicare HMO

## 2020-02-29 DIAGNOSIS — J4540 Moderate persistent asthma, uncomplicated: Secondary | ICD-10-CM

## 2020-02-29 DIAGNOSIS — E782 Mixed hyperlipidemia: Secondary | ICD-10-CM

## 2020-02-29 DIAGNOSIS — I10 Essential (primary) hypertension: Secondary | ICD-10-CM

## 2020-02-29 DIAGNOSIS — K219 Gastro-esophageal reflux disease without esophagitis: Secondary | ICD-10-CM

## 2020-02-29 DIAGNOSIS — E669 Obesity, unspecified: Secondary | ICD-10-CM

## 2020-02-29 DIAGNOSIS — F3132 Bipolar disorder, current episode depressed, moderate: Secondary | ICD-10-CM

## 2020-02-29 DIAGNOSIS — R69 Illness, unspecified: Secondary | ICD-10-CM | POA: Diagnosis not present

## 2020-02-29 NOTE — Progress Notes (Signed)
Chronic Care Management Pharmacy  Name: [name]  MRN: [id number] DOB: Dec 16, 1955  Chief Complaint/ HPI [name],  64 y.o. , female presents for their Initial CCM visit with the clinical pharmacist via telephone due to COVID-19 Pandemic.  PCP : Melanie Minium, MD.  Their chronic conditions include: HTN, HLD, DM, GERD, Depression, asthma.   Office Visits: 1/4 (PCP): Boil, residual infection treated with doxy.  Consult Visit: 2/19 (Respiratory Clinic): Chest congestion, mild persistent asthma with acute exacerbation. Zpak, benzonatate and ondansetron given. Unable to tolerate due to bipolar disorder.  Outpatient Encounter Medications as of 02/29/2020  Medication Sig  . albuterol (VENTOLIN HFA) 108 (90 Base) MCG/ACT inhaler Inhale 2 puffs into the lungs every 6 (six) hours as needed for wheezing or shortness of breath.  . Alirocumab (PRALUENT) 150 MG/ML SOAJ Inject 150 mg into the skin every 14 (fourteen) days.  Marland Kitchen azelastine (ASTELIN) 0.1 % nasal spray Place 2 sprays into both nostrils 2 (two) times daily. Use in each nostril as directed  . Cholecalciferol (VITAMIN D) 2000 units tablet Take 2,000 Units by mouth daily.  . clonazePAM (KLONOPIN) 2 MG tablet 1 mg 4 (four) times daily as needed.   . dorzolamide-timolol (COSOPT) 22.3-6.8 MG/ML ophthalmic solution 1 drop 2 (two) times daily.  . fenofibrate (TRICOR) 145 MG tablet TAKE ONE TABLET BY MOUTH DAILY  . fexofenadine (ALLEGRA) 180 MG tablet Take 180 mg by mouth daily.  . fluticasone (FLONASE) 50 MCG/ACT nasal spray PLACE 2 SPRAYS INTO BOTH NOSTRILS DAILY  . gabapentin (NEURONTIN) 600 MG tablet Take 600 mg by mouth 4 (four) times daily.   Marland Kitchen lamoTRIgine (LAMICTAL) 200 MG tablet Take 200 mg by mouth 2 (two) times daily.   Marland Kitchen LUMIGAN 0.01 % SOLN Place 1 drop into both eyes at bedtime.   . metoprolol succinate (TOPROL-XL) 50 MG 24 hr tablet TAKE ONE TABLET BY MOUTH DAILY WITH A MEAL  . Multiple Vitamin (MULTIVITAMIN WITH  MINERALS) TABS tablet Take 1 tablet by mouth daily.  Marland Kitchen OLANZapine (ZYPREXA) 10 MG tablet Take 10 mg by mouth daily as needed.  Marland Kitchen omega-3 acid ethyl esters (LOVAZA) 1 g capsule Take 2 capsules (2 g total) by mouth 2 (two) times daily.  . QUEtiapine (SEROQUEL XR) 400 MG 24 hr tablet Take one tablet every evening at bedtime  . QUEtiapine (SEROQUEL) 50 MG tablet As needed  . venlafaxine (EFFEXOR-XR) 75 MG 24 hr capsule Take 225 mg by mouth daily.   Marland Kitchen azithromycin (ZITHROMAX Z-PAK) 250 MG tablet Azithromycin 250 mg PO  2 tabs x 1 dose, then 1 tab every day for x 4 days (Patient not taking: Reported on 02/29/2020)  . beclomethasone (QVAR REDIHALER) 80 MCG/ACT inhaler Inhale 2 puffs into the lungs 2 (two) times daily. (Patient not taking: Reported on 02/29/2020)  . benzonatate (TESSALON) 100 MG capsule Take 1-2 capsules (100-200 mg total) by mouth 3 (three) times daily as needed for cough. (Patient not taking: Reported on 02/29/2020)  . doxycycline (VIBRAMYCIN) 100 MG capsule Take 1 capsule (100 mg total) by mouth 2 (two) times daily. (Patient not taking: Reported on 02/29/2020)  . estradiol (ESTRACE) 1 MG tablet Take 1 tablet (1 mg total) by mouth daily.  . methocarbamol (ROBAXIN) 500 MG tablet Take 1 tablet (500 mg total) by mouth every 8 (eight) hours as needed. (Patient not taking: Reported on 02/29/2020)  . naproxen (NAPROSYN) 500 MG tablet Take 1 tablet (500 mg total) by mouth 2 (two) times daily. (Patient  not taking: Reported on 02/29/2020)  . ondansetron (ZOFRAN ODT) 4 MG disintegrating tablet Take 1 tablet (4 mg total) by mouth every 8 (eight) hours as needed for nausea or vomiting. (Patient not taking: Reported on 02/29/2020)   No facility-administered encounter medications on file as of 02/29/2020.   Current Diagnosis/Assessment: Goals Addressed            This Visit's Progress   . Acid Reflux: Minimize recurring symptoms       CARE PLAN ENTRY  Current Barriers:  . Chronic Disease Management  support, education, and care coordination needs related to  GERD  Pharmacist Clinical Goal(s):  Marland Kitchen Over next 90 days: minimize recurring GERD symptoms.  Interventions: . Comprehensive medication review performed.  Patient Self Care Activities:  . Patient verbalizes understanding of plan to continue control with diet over the next 90 days.  Initial goal documentation.     . Asthma: Minimize recurring symptoms       CARE PLAN ENTRY  Current Barriers:  . Chronic Disease Management support, education, and care coordination needs related to asthma.  Pharmacist Clinical Goal(s):  Marland Kitchen Over next 90 days prevent recurring asthma symptoms.   Interventions: . Comprehensive medication review performed.  Patient Self Care Activities:  . Patient verbalizes understanding of plan to call with any questions. Please let pharmacist know if you want to explore getting maintenance inhaler through patient assistance program over the next 90 days.   Initial goal documentation     . Biopolar Disorder: Minimize recurring symptoms       CARE PLAN ENTRY  Current Barriers:  . Chronic Disease Management support, education, and care coordination needs related to bipolar disorder/anxiety.  o Current regimen: clonazepam 2 mg daily, Zyprexa 10 mg as needed, venlafaxine XR 225 mg daily as needed, quetiapine IR 50 mg as needed, quetiapine XR 400 mg every night.  Pharmacist Clinical Goal(s):  Marland Kitchen Over next 90 days: minimize recurring symptoms of bipolar disorder.  Interventions: . Comprehensive medication review performed.  Patient Self Care Activities:  . Patient verbalizes understanding of plan to continue current medications over the next 90 days unless directed otherwise.  Initial goal documentation.    . Hyperlipidemia / exercise       CARE PLAN ENTRY (see longitudinal plan of care for additional care plan information)  Current Barriers:  . Current antihyperlipidemic regimen: Praluent 150 mg every  14 days, fenofibrate 145 mg daily, Lovaza 2 g daily . Previous antihyperlipidemic medications tried: Repatha - insurance no longer approves . Most recent lipid panel:     Component Value Date/Time   CHOL 165 12/14/2019 0924   CHOL 163 11/14/2018 1447   CHOL 312 (H) 06/19/2015 0924   TRIG 153.0 (H) 12/14/2019 0924   TRIG 657 (H) 06/19/2015 0924   HDL 64.60 12/14/2019 0924   HDL 71 11/14/2018 1447   HDL 44 06/19/2015 0924   CHOLHDL 3 12/14/2019 0924   VLDL 30.6 12/14/2019 0924   LDLCALC 70 12/14/2019 0924   LDLCALC 66 11/14/2018 1447   LDLCALC Comment 06/19/2015 0924   LDLDIRECT 123.0 01/26/2019 1030   Pharmacist Clinical Goal(s):  Marland Kitchen Over the next 90 days, patient will work with PharmD and providers towards optimized antihyperlipidemic therapy o LDL Cholesterol <100   Interventions: . Comprehensive medication review performed; medication list updated in electronic medical record.  Bertram Savin care team collaboration (see longitudinal plan of care)  Patient Self Care Activities:  . Try your best to establish a walking routine with  goal of 20-30 minutes 3-5 times a week as baseline. . Please call with any questions!  Initial goal documentation.       Hypertension  Office blood pressures are  BP Readings from Last 3 Encounters:  02/20/20 116/74  02/13/20 138/81  12/29/19 138/80  Patient is currently controlled on the following medications: Toprol XL 50mg  daily. Patient checks BP at home infrequently. Patient home BP readings are ranging: n/a. We discussed: exercise plan as noted in hyperlipidemia a/p. Denies dizziness, SOB, chest pain.  Plan Continue current medications.    Hyperlipidemia   Lipid Panel     Component Value Date/Time   CHOL 165 12/14/2019 0924   CHOL 163 11/14/2018 1447   CHOL 312 (H) 06/19/2015 0924   TRIG 153.0 (H) 12/14/2019 0924   TRIG 657 (H) 06/19/2015 0924   HDL 64.60 12/14/2019 0924   HDL 71 11/14/2018 1447   HDL 44 06/19/2015  0924   CHOLHDL 3 12/14/2019 0924   VLDL 30.6 12/14/2019 0924   LDLCALC 70 12/14/2019 0924   LDLCALC 66 11/14/2018 1447   LDLCALC Comment 06/19/2015 0924   LDLDIRECT 123.0 01/26/2019 1030   LABVLDL 26 11/14/2018 1447   The 10-year ASCVD risk score (Goff DC Jr., et al., 2013) is: 6.8%   Values used to calculate the score:     Age: 52 years     Sex: Female     Is Non-Hispanic African American: No     Diabetic: Yes     Tobacco smoker: No     Systolic Blood Pressure: 99991111 mmHg     Is BP treated: Yes     HDL Cholesterol: 64.6 mg/dL     Total Cholesterol: 165 mg/dL   Patient is currently controlled on the following medications: Praluent 150 mg every 14 days, fenofibrate 145 mg daily, Lovaza 2 g daily. Previously on Repatha but insurance did not cover after insurance change. Denies any muscle or abdominal pain or n/v.  Previously would walk routinely. We discussed and agreed upon establishing a walking routine, 20-30 minutes 3-5x/week.  Plan Continue current medications and control with diet and exercise.   GERD  Patient denies GERD associated symptoms including dysphagia, heartburn or nausea. Currently is not taking anything for acid reflux. No complaints at this time, controlled with diet.   Plan  Continue with diet.   Anxiety / Bipolar  Patient is currently controlled on clonazepam 2 mg daily, Zyprexa 10 mg as needed, venlafaxine XR 225 mg daily as needed, quetiapine IR 50 mg as needed, quetiapine XR 400 mg every night. Reports managing symptoms of anxiety/bipolar disorder well, is trying to get back to having a routine as this has been disrupted during the pandemic.  We discussed and agreed upon establishing a walking routine, 20-30 minutes 3-5x/week.  Plan Continue current medications and exercise.    Asthma  Using albuterol rescue inhaler as needed, previously placed on QVAR but cost was too high. Is currently free of any symptoms, and denies any difficulty breathing or recent  exacerbations.   We discussed patient assistance programs today. Patient will follow-up with pharmacist if wanting to get placed back on maintenance inhaler.   Vaccines  Reviewed and discussed patient's vaccination history.  Recently received 2nd dose of COVID vaccine.   Immunization History  Administered Date(s) Administered  . Influenza Split 09/02/2012  . Influenza,inj,Quad PF,6+ Mos 10/09/2013, 08/13/2015, 08/17/2016, 07/28/2017, 08/03/2018, 08/08/2019  . Pneumococcal Polysaccharide-23 09/02/2012  . Tdap 07/15/2012  . Zoster Recombinat (Shingrix) 11/10/2018, 06/09/2019  Plan Patient is up to date. No recommendations at this time.   Medication Management  Pt uses Cytogeneticist for all medications. Denies having any issues remembering to take medications or needing help with medication management.   Plan Continue current medication mangement.  Follow up: 3 month phone visit. ______________ Visit Information SDOH (Social Determinants of Health) assessments performed: Yes.   Food Insecurity: No Food Insecurity  . Worried About Charity fundraiser in the Last Year: Never true  . Ran Out of Food in the Last Year: Never true     Transportation Needs: No Transportation Needs  . Lack of Transportation (Medical): No  . Lack of Transportation (Non-Medical): No   Ms. Mulgrew was given information about Chronic Care Management services today including:  1. CCM service includes personalized support from designated clinical staff supervised by her physician, including individualized plan of care and coordination with other care providers 2. 24/7 contact phone numbers for assistance for urgent and routine care needs. 3. Standard insurance, coinsurance, copays and deductibles apply for chronic care management only during months in which we provide at least 20 minutes of these services. Most insurances cover these services at 100%, however patients may be responsible for any  copay, coinsurance and/or deductible if applicable. This service may help you avoid the need for more expensive face-to-face services. 4. Only one practitioner may furnish and bill the service in a calendar month. 5. The patient may stop CCM services at any time (effective at the end of the month) by phone call to the office staff.  Patient agreed to services and verbal consent obtained.   [name], Pharm.D. Clinical Pharmacist [facility] [phone]

## 2020-02-29 NOTE — Patient Instructions (Addendum)
Visit Information  Goals Addressed            This Visit's Progress   . Acid Reflux: Minimize recurring symptoms       CARE PLAN ENTRY  Current Barriers:  . Chronic Disease Management support, education, and care coordination needs related to  GERD  Pharmacist Clinical Goal(s):  Marland Kitchen Over next 90 days: minimize recurring GERD symptoms.  Interventions: . Comprehensive medication review performed.  Patient Self Care Activities:  . Patient verbalizes understanding of plan to continue control with diet over the next 90 days.  Initial goal documentation.     . Asthma: Minimize recurring symptoms       CARE PLAN ENTRY  Current Barriers:  . Chronic Disease Management support, education, and care coordination needs related to asthma.  Pharmacist Clinical Goal(s):  Marland Kitchen Over next 90 days prevent recurring asthma symptoms.   Interventions: . Comprehensive medication review performed.  Patient Self Care Activities:  . Patient verbalizes understanding of plan to call with any questions. Please let pharmacist know if you want to explore getting maintenance inhaler through patient assistance program over the next 90 days.   Initial goal documentation     . Biopolar Disorder: Minimize recurring symptoms       CARE PLAN ENTRY  Current Barriers:  . Chronic Disease Management support, education, and care coordination needs related to bipolar disorder/anxiety.  o Current regimen: clonazepam 2 mg daily, Zyprexa 10 mg as needed, venlafaxine XR 225 mg daily as needed, quetiapine IR 50 mg as needed, quetiapine XR 400 mg every night.  Pharmacist Clinical Goal(s):  Marland Kitchen Over next 90 days: minimize recurring symptoms of bipolar disorder.  Interventions: . Comprehensive medication review performed.  Patient Self Care Activities:  . Patient verbalizes understanding of plan to continue current medications over the next 90 days unless directed otherwise.  Initial goal documentation.    .  Hyperlipidemia / exercise       CARE PLAN ENTRY (see longitudinal plan of care for additional care plan information)  Current Barriers:  . Current antihyperlipidemic regimen: Praluent 150 mg every 14 days, fenofibrate 145 mg daily, Lovaza 2 g daily . Previous antihyperlipidemic medications tried: Repatha - insurance no longer approves . Most recent lipid panel:     Component Value Date/Time   CHOL 165 12/14/2019 0924   CHOL 163 11/14/2018 1447   CHOL 312 (H) 06/19/2015 0924   TRIG 153.0 (H) 12/14/2019 0924   TRIG 657 (H) 06/19/2015 0924   HDL 64.60 12/14/2019 0924   HDL 71 11/14/2018 1447   HDL 44 06/19/2015 0924   CHOLHDL 3 12/14/2019 0924   VLDL 30.6 12/14/2019 0924   LDLCALC 70 12/14/2019 0924   LDLCALC 66 11/14/2018 1447   LDLCALC Comment 06/19/2015 0924   LDLDIRECT 123.0 01/26/2019 1030   Pharmacist Clinical Goal(s):  Marland Kitchen Over the next 90 days, patient will work with PharmD and providers towards optimized antihyperlipidemic therapy o LDL Cholesterol <100   Interventions: . Comprehensive medication review performed; medication list updated in electronic medical record.  Bertram Savin care team collaboration (see longitudinal plan of care)  Patient Self Care Activities:  . Try your best to establish a walking routine with goal of 20-30 minutes 3-5 times a week as baseline. . Please call with any questions!  Initial goal documentation.       Ms. Luan was given information about Chronic Care Management services today including:  1. CCM service includes personalized support from designated clinical staff supervised by  her physician, including individualized plan of care and coordination with other care providers 2. 24/7 contact phone numbers for assistance for urgent and routine care needs. 3. Standard insurance, coinsurance, copays and deductibles apply for chronic care management only during months in which we provide at least 20 minutes of these services. Most  insurances cover these services at 100%, however patients may be responsible for any copay, coinsurance and/or deductible if applicable. This service may help you avoid the need for more expensive face-to-face services. 4. Only one practitioner may furnish and bill the service in a calendar month. 5. The patient may stop CCM services at any time (effective at the end of the month) by phone call to the office staff.  Patient agreed to services and verbal consent obtained.   The patient verbalized understanding of instructions provided today and agreed to receive a mailed copy of patient instruction and/or educational materials. Telephone follow up appointment with pharmacy team member scheduled for: See next appointment with "Care Management Staff" under "What's Next" below.   Thank you!  [name], Pharm.D. Clinical Pharmacist [facility] [phone] Exercising to Lose Weight Exercise is structured, repetitive physical activity to improve fitness and health. Getting regular exercise is important for everyone. It is especially important if you are overweight. Being overweight increases your risk of heart disease, stroke, diabetes, high blood pressure, and several types of cancer. Reducing your calorie intake and exercising can help you lose weight. Exercise is usually categorized as moderate or vigorous intensity. To lose weight, most people need to do a certain amount of moderate-intensity or vigorous-intensity exercise each week. Moderate-intensity exercise  Moderate-intensity exercise is any activity that gets you moving enough to burn at least three times more energy (calories) than if you were sitting. Examples of moderate exercise include:  Walking a mile in 15 minutes.  Doing light yard work.  Biking at an easy pace. Most people should get at least 150 minutes (2 hours and 30 minutes) a week of moderate-intensity exercise to maintain their body  weight. Vigorous-intensity exercise Vigorous-intensity exercise is any activity that gets you moving enough to burn at least six times more calories than if you were sitting. When you exercise at this intensity, you should be working hard enough that you are not able to carry on a conversation. Examples of vigorous exercise include:  Running.  Playing a team sport, such as football, basketball, and soccer.  Jumping rope. Most people should get at least 75 minutes (1 hour and 15 minutes) a week of vigorous-intensity exercise to maintain their body weight. How can exercise affect me? When you exercise enough to burn more calories than you eat, you lose weight. Exercise also reduces body fat and builds muscle. The more muscle you have, the more calories you burn. Exercise also:  Improves mood.  Reduces stress and tension.  Improves your overall fitness, flexibility, and endurance.  Increases bone strength. The amount of exercise you need to lose weight depends on:  Your age.  The type of exercise.  Any health conditions you have.  Your overall physical ability. Talk to your health care provider about how much exercise you need and what types of activities are safe for you. What actions can I take to lose weight? Nutrition   Make changes to your diet as told by your health care provider or diet and nutrition specialist (dietitian). This may include: ? Eating fewer calories. ? Eating more protein. ? Eating less unhealthy  fats. ? Eating a diet that includes fresh fruits and vegetables, whole grains, low-fat dairy products, and lean protein. ? Avoiding foods with added fat, salt, and sugar.  Drink plenty of water while you exercise to prevent dehydration or heat stroke. Activity  Choose an activity that you enjoy and set realistic goals. Your health care provider can help you make an exercise plan that works for you.  Exercise at a moderate or vigorous intensity most days of  the week. ? The intensity of exercise may vary from person to person. You can tell how intense a workout is for you by paying attention to your breathing and heartbeat. Most people will notice their breathing and heartbeat get faster with more intense exercise.  Do resistance training twice each week, such as: ? Push-ups. ? Sit-ups. ? Lifting weights. ? Using resistance bands.  Getting short amounts of exercise can be just as helpful as long structured periods of exercise. If you have trouble finding time to exercise, try to include exercise in your daily routine. ? Get up, stretch, and walk around every 30 minutes throughout the day. ? Go for a walk during your lunch break. ? Park your car farther away from your destination. ? If you take public transportation, get off one stop early and walk the rest of the way. ? Make phone calls while standing up and walking around. ? Take the stairs instead of elevators or escalators.  Wear comfortable clothes and shoes with good support.  Do not exercise so much that you hurt yourself, feel dizzy, or get very short of breath. Where to find more information  U.S. Department of Health and Human Services: [employer].at  Centers for Disease Control and Prevention (CDC): http://www.wolf.info/ Contact a health care provider:  Before starting a new exercise program.  If you have questions or concerns about your weight.  If you have a medical problem that keeps you from exercising. Get help right away if you have any of the following while exercising:  Injury.  Dizziness.  Difficulty breathing or shortness of breath that does not go away when you stop exercising.  Chest pain.  Rapid heartbeat. Summary  Being overweight increases your risk of heart disease, stroke, diabetes, high blood pressure, and several types of cancer.  Losing weight happens when you burn more calories than you eat.  Reducing the amount of calories you eat in addition to  getting regular moderate or vigorous exercise each week helps you lose weight. This information is not intended to replace advice given to you by your health care provider. Make sure you discuss any questions you have with your health care provider. Document Revised: 11/08/2017 Document Reviewed: 11/08/2017 Elsevier Patient Education  2020 [employer].    Exercising to Lose Weight Exercise is structured, repetitive physical activity to improve fitness and health. Getting regular exercise is important for everyone. It is especially important if you are overweight. Being overweight increases your risk of heart disease, stroke, diabetes, high blood pressure, and several types of cancer. Reducing your calorie intake and exercising can help you lose weight. Exercise is usually categorized as moderate or vigorous intensity. To lose weight, most people need to do a certain amount of moderate-intensity or vigorous-intensity exercise each week. Moderate-intensity exercise  Moderate-intensity exercise is any activity that gets you moving enough to burn at least three times more energy (calories) than if you were sitting. Examples of moderate exercise include:  Walking a mile in 15 minutes.  Doing light yard  work.  Psychiatric nurse at an Management consultant. Most people should get at least 150 minutes (2 hours and 30 minutes) a week of moderate-intensity exercise to maintain their body weight. Vigorous-intensity exercise Vigorous-intensity exercise is any activity that gets you moving enough to burn at least six times more calories than if you were sitting. When you exercise at this intensity, you should be working hard enough that you are not able to carry on a conversation. Examples of vigorous exercise include:  Running.  Playing a team sport, such as football, basketball, and soccer.  Jumping rope. Most people should get at least 75 minutes (1 hour and 15 minutes) a week of vigorous-intensity exercise to  maintain their body weight. How can exercise affect me? When you exercise enough to burn more calories than you eat, you lose weight. Exercise also reduces body fat and builds muscle. The more muscle you have, the more calories you burn. Exercise also:  Improves mood.  Reduces stress and tension.  Improves your overall fitness, flexibility, and endurance.  Increases bone strength. The amount of exercise you need to lose weight depends on:  Your age.  The type of exercise.  Any health conditions you have.  Your overall physical ability. Talk to your health care provider about how much exercise you need and what types of activities are safe for you. What actions can I take to lose weight? Nutrition   Make changes to your diet as told by your health care provider or diet and nutrition specialist (dietitian). This may include: ? Eating fewer calories. ? Eating more protein. ? Eating less unhealthy fats. ? Eating a diet that includes fresh fruits and vegetables, whole grains, low-fat dairy products, and lean protein. ? Avoiding foods with added fat, salt, and sugar.  Drink plenty of water while you exercise to prevent dehydration or heat stroke. Activity  Choose an activity that you enjoy and set realistic goals. Your health care provider can help you make an exercise plan that works for you.  Exercise at a moderate or vigorous intensity most days of the week. ? The intensity of exercise may vary from person to person. You can tell how intense a workout is for you by paying attention to your breathing and heartbeat. Most people will notice their breathing and heartbeat get faster with more intense exercise.  Do resistance training twice each week, such as: ? Push-ups. ? Sit-ups. ? Lifting weights. ? Using resistance bands.  Getting short amounts of exercise can be just as helpful as long structured periods of exercise. If you have trouble finding time to exercise, try to  include exercise in your daily routine. ? Get up, stretch, and walk around every 30 minutes throughout the day. ? Go for a walk during your lunch break. ? Park your car farther away from your destination. ? If you take public transportation, get off one stop early and walk the rest of the way. ? Make phone calls while standing up and walking around. ? Take the stairs instead of elevators or escalators.  Wear comfortable clothes and shoes with good support.  Do not exercise so much that you hurt yourself, feel dizzy, or get very short of breath. Where to find more information  U.S. Department of Health and Human Services: [employer]at  Centers for Disease Control and Prevention (CDC): http://www.wolf.info/ Contact a health care provider:  Before starting a new exercise program.  If you have questions or concerns about your weight.  If you have a  medical problem that keeps you from exercising. Get help right away if you have any of the following while exercising:  Injury.  Dizziness.  Difficulty breathing or shortness of breath that does not go away when you stop exercising.  Chest pain.  Rapid heartbeat. Summary  Being overweight increases your risk of heart disease, stroke, diabetes, high blood pressure, and several types of cancer.  Losing weight happens when you burn more calories than you eat.  Reducing the amount of calories you eat in addition to getting regular moderate or vigorous exercise each week helps you lose weight. This information is not intended to replace advice given to you by your health care provider. Make sure you discuss any questions you have with your health care provider. Document Revised: 11/08/2017 Document Reviewed: 11/08/2017 Elsevier Patient Education  2020 [employer].

## 2020-03-05 DIAGNOSIS — H401131 Primary open-angle glaucoma, bilateral, mild stage: Secondary | ICD-10-CM | POA: Diagnosis not present

## 2020-03-07 ENCOUNTER — Other Ambulatory Visit: Payer: Self-pay

## 2020-03-07 ENCOUNTER — Encounter: Payer: Self-pay | Admitting: Family Medicine

## 2020-03-07 ENCOUNTER — Ambulatory Visit (INDEPENDENT_AMBULATORY_CARE_PROVIDER_SITE_OTHER): Payer: Medicare HMO | Admitting: Family Medicine

## 2020-03-07 VITALS — BP 124/72 | HR 80 | Temp 98.3°F | Resp 17 | Ht 69.0 in | Wt 227.2 lb

## 2020-03-07 DIAGNOSIS — J301 Allergic rhinitis due to pollen: Secondary | ICD-10-CM | POA: Diagnosis not present

## 2020-03-07 DIAGNOSIS — E119 Type 2 diabetes mellitus without complications: Secondary | ICD-10-CM

## 2020-03-07 DIAGNOSIS — J4540 Moderate persistent asthma, uncomplicated: Secondary | ICD-10-CM

## 2020-03-07 LAB — MICROALBUMIN / CREATININE URINE RATIO
Creatinine,U: 100.2 mg/dL
Microalb Creat Ratio: 0.9 mg/g (ref 0.0–30.0)
Microalb, Ur: 0.9 mg/dL (ref 0.0–1.9)

## 2020-03-07 MED ORDER — MONTELUKAST SODIUM 10 MG PO TABS: 10.0000 mg | ORAL_TABLET | Freq: Every day | ORAL | 3 refills | Status: DC

## 2020-03-07 NOTE — Assessment & Plan Note (Signed)
Ongoing issue for pt.  Overall doing well on Qvar w/ albuterol PRN.  Will add Singulair due to allergy triggers.  Pt expressed understanding and is in agreement w/ plan.

## 2020-03-07 NOTE — Patient Instructions (Signed)
Follow up in June to recheck diabetes We'll notify you of your urine results CONTINUE the Allegra, Flonase, and Astelin ADD the Singulair (Montelukast) nightly Drink plenty of water Call with any questions or concerns Hang in there!

## 2020-03-07 NOTE — Assessment & Plan Note (Signed)
Due for Microalbumin.  Foot exam done today.  Will follow.

## 2020-03-07 NOTE — Assessment & Plan Note (Signed)
Deteriorated.  Her allergy congestion is causing eustachian tube dysfxn and her feelings of dizziness.  Continue Allegra, Astelin, and Flonase.  Add Singulair nightly.  Will follow.

## 2020-03-07 NOTE — Progress Notes (Signed)
   Subjective:    Patient ID: [name], female    DOB: 12-22-1955, 64 y.o.   MRN: [id number]  HPI Dizziness- 'i've just had some equilibrium going on'.  Pt has had a lot of allergy sxs recently.  Hx of similar dizziness sxs previously.  Taking Allegra daily and using nasal sprays (Flonase and Astelin).  Denies vertigo but has a sensation like 'being on a boat'.  No falls.  'a little wobbly'  Asthma- worse recently.  Using Albuterol prn, Qvar regularly.  Using Albuterol q3 days.  Wilburn Mylar 'heard a rumble' in her chest but not present today.  DM- chronic problem, due for foot exam, microalbumin.  Last A1C 6.7  Review of Systems For ROS see HPI   This visit occurred during the SARS-CoV-2 public health emergency.  Safety protocols were in place, including screening questions prior to the visit, additional usage of staff PPE, and extensive cleaning of exam room while observing appropriate contact time as indicated for disinfecting solutions.       Objective:   Physical Exam Vitals reviewed.  Constitutional:      General: She is not in acute distress.    Appearance: She is well-developed.  HENT:     Head: Normocephalic and atraumatic.     Ears:     Comments: Mildly retracted TMs    Nose: Congestion present.  Eyes:     Extraocular Movements: Extraocular movements intact.     Conjunctiva/sclera: Conjunctivae normal.     Pupils: Pupils are equal, round, and reactive to light.     Comments: No nystagmus  Neck:     Thyroid: No thyromegaly.  Cardiovascular:     Rate and Rhythm: Normal rate and regular rhythm.     Heart sounds: Normal heart sounds. No murmur.  Pulmonary:     Effort: Pulmonary effort is normal. No respiratory distress.     Breath sounds: Normal breath sounds.  Abdominal:     General: There is no distension.     Palpations: Abdomen is soft.     Tenderness: There is no abdominal tenderness.  Musculoskeletal:     Cervical back: Normal range of motion and neck  supple.  Lymphadenopathy:     Cervical: No cervical adenopathy.  Skin:    General: Skin is warm and dry.  Neurological:     Mental Status: She is alert and oriented to person, place, and time.  Psychiatric:        Behavior: Behavior normal.           Assessment & Plan:

## 2020-03-08 ENCOUNTER — Encounter: Payer: Self-pay | Admitting: General Practice

## 2020-03-11 ENCOUNTER — Ambulatory Visit (INDEPENDENT_AMBULATORY_CARE_PROVIDER_SITE_OTHER): Payer: Medicare HMO | Admitting: Neurology

## 2020-03-11 ENCOUNTER — Other Ambulatory Visit: Payer: Self-pay

## 2020-03-11 DIAGNOSIS — G4719 Other hypersomnia: Secondary | ICD-10-CM

## 2020-03-11 DIAGNOSIS — G4733 Obstructive sleep apnea (adult) (pediatric): Secondary | ICD-10-CM

## 2020-03-11 DIAGNOSIS — E669 Obesity, unspecified: Secondary | ICD-10-CM

## 2020-03-14 DIAGNOSIS — R69 Illness, unspecified: Secondary | ICD-10-CM | POA: Diagnosis not present

## 2020-03-19 ENCOUNTER — Telehealth: Payer: Self-pay

## 2020-03-19 NOTE — Procedures (Signed)
Patient Information     First Name: Melanie Last Name: Hill ID: [id number]  Birth Date: 09-Feb-1956 Age: 64 Gender: Female  Referring Provider: Midge Minium, MD BMI: 34.0 (W=229 lb, H=5' 9'')  Neck Circ.:  16 '' Epworth:  6/24   Sleep Study Information    Study Date: Mar 11, 2020 S/H/A Version: 001.001.001.001 / 4.1.1528 / 90  History:    64 year old woman with a history of bipolar disorder, obesity, mitral valve prolapse, hyperlipidemia, allergic rhinitis, palpitations, constipation, heart murmur, chronic cough, reflux disease, anxiety, and low back pain status post back surgery in 2001, arthritis, status post left total knee arthroplasty and right total knee arthroplasty, tonsillectomy, and hysterectomy, and status post nasal surgeries twice, who reports daytime somnolence. She has been on CPAP therapy and qualifies for a new machine.  Summary & Diagnosis:     OSA Recommendations:     This home sleep test demonstrates overall mild/near-moderate obstructive sleep apnea with a total AHI of 14.7/hour and O2 nadir of 86%. The patient will be advised to continue CPAP therapy with the current settings of 11 cm. She would qualify for new equipment and I will write for a new machine. Please note that untreated obstructive sleep apnea may carry additional perioperative morbidity. Patients with significant obstructive sleep apnea should receive perioperative PAP therapy and the surgeons and particularly the anesthesiologist should be informed of the diagnosis and the severity of the sleep disordered breathing. The patient should be cautioned not to drive, work at heights, or operate dangerous or heavy equipment when tired or sleepy. Review and reiteration of good sleep hygiene measures should be pursued with any patient. Other causes of the patient's symptoms, including circadian rhythm disturbances, an underlying mood disorder, medication effect and/or an underlying medical problem cannot be ruled out  based on this test. Clinical correlation is recommended.   The patient and his referring provider will be notified of the test results. The patient will be seen in follow up in sleep clinic at [facility].  I certify that I have reviewed the raw data recording prior to the issuance of this report in accordance with the standards of the American Academy of Sleep Medicine (AASM).  Star Age, MD, PhD [facility]) Diplomat, ABPN (Neurology and Sleep)              Sleep Summary    Oxygen Saturation Statistics     Start Study Time: End Study Time: Total Recording Time:          11:17:53 PM 6:23:31 AM         7 h, 5 min  Total Sleep Time % REM of Sleep Time:  6 h, 13 min  7.2    Mean: 90 Minimum: 86 Maximum: 95  Mean of Desaturations Nadirs (%):   89  Oxygen Desaturation. %: 4-9 10-20 >20 Total  Events Number Total  31 100.0  0 0.0  0 0.0  31 100.0  Oxygen Saturation: <90 <=88 <85 <80 <70  Duration (minutes): Sleep % 76.3 15.9 20.5 4.3 0.0 0.0 0.0 0.0 0.0 0.0     Respiratory Indices      Total Events REM NREM All Night  pRDI:  91  pAHI:  90 ODI:  31  pAHIc:  6  % CSR: 0.0 N/A N/A N/A N/A N/A N/A N/A N/A 14.9 14.7 5.1 1.0       Pulse Rate Statistics during Sleep (BPM)  Mean: 69 Minimum: N/A Maximum: 85    Indices are calculated using technically valid sleep time of 6 h, 6 min.  pRDI/pAHI are calculated using oxi desaturations ? 3% REM/NREM indices appear only if REM time > 30 min.   Body Position Statistics  Position Supine Prone Right Left Non-Supine  Sleep (min) 76.0 288.5 0.0 3.5 292.0  Sleep % 20.4 77.3 0.0 0.9 78.3  pRDI 10.3 14.6 N/A N/A 14.9  pAHI 10.3 14.4 N/A N/A 14.7  ODI 4.8 4.2 N/A N/A 4.6     Snoring Statistics Snoring Level (dB) >40 >50 >60 >70 >80 >Threshold (45)  Sleep (min) 321.8 85.2 3.6 0.0 0.0 153.5  Sleep % 86.3 22.8 1.0 0.0 0.0 41.1    Mean: 46 dB Sleep Stages Chart                        pAHI=14.7                                   Mild              Moderate                    Severe                                                 5              15                    30

## 2020-03-19 NOTE — Progress Notes (Signed)
Patient referred by [name] for re-eval of OSA and sleepiness reported, seen by me on 02/13/20 and I had previously seen her since 2015. She would be eligible for a new CPAP, HST done on 03/11/20 confirmed her OSA in the mild/near moderate range. I would like to write for a new CPAP machine at the current setting of 11 cm with EPR 3 through her DME company. I have placed an order in the chart. Please send referral, talk to patient, send report to referring MD. We will need a FU in sleep clinic for 10 weeks post-PAP set up, please arrange that with one of our NPs. Thanks,   Melanie Age, MD, PhD [facility])

## 2020-03-19 NOTE — Telephone Encounter (Signed)
I called pt. No answer, left a message asking pt to call me back.   

## 2020-03-19 NOTE — Addendum Note (Signed)
Addended by: Star Age on: 03/19/2020 07:44 AM   Modules accepted: Orders

## 2020-03-19 NOTE — Telephone Encounter (Signed)
-----   Message from Star Age, MD sent at 03/19/2020  7:44 AM EDT ----- Patient referred by [name] for re-eval of OSA and sleepiness reported, seen by me on 02/13/20 and I had previously seen her since 2015. She would be eligible for a new CPAP, HST done on 03/11/20 confirmed her OSA in the mild/near moderate range. I would like to write for a new CPAP machine at the current setting of 11 cm with EPR 3 through her DME company. I have placed an order in the chart. Please send referral, talk to patient, send report to referring MD. We will need a FU in sleep clinic for 10 weeks post-PAP set up, please arrange that with one of our NPs. Thanks,   Star Age, MD, PhD [facility])

## 2020-03-22 NOTE — Telephone Encounter (Signed)
I called pt. I advised pt that Dr. Rexene Alberts reviewed their sleep study results and found that pt had mild/moderate osa noted on recent ss. Dr. Rexene Alberts recommends that pt start a cpap for treatment. I reviewed PAP compliance expectations with the pt. Pt is agreeable to starting a CPAP. I advised pt that an order will be sent to a DME, [employer], and [employer] will call the pt within about one week after they file with the pt's insurance. [employer] will show the pt how to use the machine, fit for masks, and troubleshoot the CPAP if needed. A follow up appt was made for insurance purposes with Dr. Rexene Alberts on 06/04/2020 at 1030 am. Pt verbalized understanding to arrive 15 minutes early and bring their CPAP. A letter with all of this information in it will be mailed to the pt as a reminder. I verified with the pt that the address we have on file is correct. Pt verbalized understanding of results. Pt had no questions at this time but was encouraged to call back if questions arise. I have sent the order to [employer] and have received confirmation that they have received the order.

## 2020-03-28 DIAGNOSIS — R69 Illness, unspecified: Secondary | ICD-10-CM | POA: Diagnosis not present

## 2020-04-01 DIAGNOSIS — G4733 Obstructive sleep apnea (adult) (pediatric): Secondary | ICD-10-CM | POA: Diagnosis not present

## 2020-04-03 ENCOUNTER — Ambulatory Visit: Payer: Medicare HMO | Admitting: Neurology

## 2020-04-10 ENCOUNTER — Encounter: Payer: Self-pay | Admitting: Family Medicine

## 2020-04-10 DIAGNOSIS — Z1231 Encounter for screening mammogram for malignant neoplasm of breast: Secondary | ICD-10-CM | POA: Diagnosis not present

## 2020-04-16 DIAGNOSIS — H16223 Keratoconjunctivitis sicca, not specified as Sjogren's, bilateral: Secondary | ICD-10-CM | POA: Diagnosis not present

## 2020-04-17 DIAGNOSIS — R69 Illness, unspecified: Secondary | ICD-10-CM | POA: Diagnosis not present

## 2020-04-18 DIAGNOSIS — R69 Illness, unspecified: Secondary | ICD-10-CM | POA: Diagnosis not present

## 2020-04-23 ENCOUNTER — Other Ambulatory Visit: Payer: Self-pay | Admitting: Family Medicine

## 2020-05-01 DIAGNOSIS — H401131 Primary open-angle glaucoma, bilateral, mild stage: Secondary | ICD-10-CM | POA: Diagnosis not present

## 2020-05-02 DIAGNOSIS — G4733 Obstructive sleep apnea (adult) (pediatric): Secondary | ICD-10-CM | POA: Diagnosis not present

## 2020-05-02 DIAGNOSIS — R69 Illness, unspecified: Secondary | ICD-10-CM | POA: Diagnosis not present

## 2020-05-09 ENCOUNTER — Ambulatory Visit (INDEPENDENT_AMBULATORY_CARE_PROVIDER_SITE_OTHER): Payer: Medicare HMO | Admitting: Family Medicine

## 2020-05-09 ENCOUNTER — Encounter: Payer: Self-pay | Admitting: Family Medicine

## 2020-05-09 ENCOUNTER — Other Ambulatory Visit: Payer: Self-pay

## 2020-05-09 VITALS — BP 138/89 | HR 79 | Temp 98.4°F | Resp 16 | Ht 69.0 in | Wt 224.0 lb

## 2020-05-09 DIAGNOSIS — I10 Essential (primary) hypertension: Secondary | ICD-10-CM | POA: Diagnosis not present

## 2020-05-09 DIAGNOSIS — E119 Type 2 diabetes mellitus without complications: Secondary | ICD-10-CM

## 2020-05-09 DIAGNOSIS — B9689 Other specified bacterial agents as the cause of diseases classified elsewhere: Secondary | ICD-10-CM

## 2020-05-09 DIAGNOSIS — J329 Chronic sinusitis, unspecified: Secondary | ICD-10-CM

## 2020-05-09 LAB — CBC WITH DIFFERENTIAL/PLATELET
Basophils Absolute: 0 10*3/uL (ref 0.0–0.1)
Basophils Relative: 0.3 % (ref 0.0–3.0)
Eosinophils Absolute: 0 10*3/uL (ref 0.0–0.7)
Eosinophils Relative: 0 % (ref 0.0–5.0)
HCT: 36.8 % (ref 36.0–46.0)
Hemoglobin: 12.6 g/dL (ref 12.0–15.0)
Lymphocytes Relative: 37.1 % (ref 12.0–46.0)
Lymphs Abs: 1.2 10*3/uL (ref 0.7–4.0)
MCHC: 34.3 g/dL (ref 30.0–36.0)
MCV: 89.4 fl (ref 78.0–100.0)
Monocytes Absolute: 0.2 10*3/uL (ref 0.1–1.0)
Monocytes Relative: 6.8 % (ref 3.0–12.0)
Neutro Abs: 1.7 10*3/uL (ref 1.4–7.7)
Neutrophils Relative %: 55.8 % (ref 43.0–77.0)
Platelets: 228 10*3/uL (ref 150.0–400.0)
RBC: 4.11 Mil/uL (ref 3.87–5.11)
RDW: 14 % (ref 11.5–15.5)
WBC: 3.1 10*3/uL — ABNORMAL LOW (ref 4.0–10.5)

## 2020-05-09 LAB — BASIC METABOLIC PANEL
BUN: 13 mg/dL (ref 6–23)
CO2: 26 mEq/L (ref 19–32)
Calcium: 9.8 mg/dL (ref 8.4–10.5)
Chloride: 104 mEq/L (ref 96–112)
Creatinine, Ser: 0.6 mg/dL (ref 0.40–1.20)
GFR: 101.06 mL/min (ref >60.00)
Glucose, Bld: 107 mg/dL — ABNORMAL HIGH (ref 70–99)
Potassium: 4.2 mEq/L (ref 3.5–5.1)
Sodium: 141 mEq/L (ref 135–145)

## 2020-05-09 LAB — TSH: TSH: 0.62 u[IU]/mL (ref 0.35–4.50)

## 2020-05-09 LAB — HEMOGLOBIN A1C: Hgb A1c MFr Bld: 6.9 % — ABNORMAL HIGH (ref 4.6–6.5)

## 2020-05-09 MED ORDER — DOXYCYCLINE HYCLATE 100 MG PO TABS: 100.0000 mg | ORAL_TABLET | Freq: Two times a day (BID) | ORAL | 0 refills | Status: DC

## 2020-05-09 NOTE — Assessment & Plan Note (Signed)
Ongoing issue for pt.  Due for A1C.  UTD on eye exam, foot exam, microalbumin.  Check labs and start meds prn.

## 2020-05-09 NOTE — Progress Notes (Signed)
   Subjective:    Patient ID: [name], female    DOB: 02/05/1956, 64 y.o.   MRN: [id number]  HPI DM- ongoing issue for pt.  Due for A1C.  UTD on eye exam, foot exam, microalbumin.  Currently diet controlled.  No numbness/tingling of hands/feet.  HTN- chronic problem, has been previously well controlled on Metoprolol.  She reports it has been higher recently. Denies CP, SOB, visual changes, edema.  URI- 'I haven't felt good all week'.  + fatigue, frontal sinus pain.  + HA.  + nasal congestion, PND.  Taking Allegra and 2 nasal sprays.  Mild nausea.  No fever.  Denies maxillary sinus pain.  Pt reports this feels similar to previous sinus infxns.   Review of Systems For ROS see HPI   This visit occurred during the SARS-CoV-2 public health emergency.  Safety protocols were in place, including screening questions prior to the visit, additional usage of staff PPE, and extensive cleaning of exam room while observing appropriate contact time as indicated for disinfecting solutions.       Objective:   Physical Exam Vitals reviewed.  Constitutional:      General: She is not in acute distress.    Appearance: She is well-developed. She is obese. She is not ill-appearing.  HENT:     Head: Normocephalic and atraumatic.     Right Ear: Tympanic membrane normal.     Left Ear: Tympanic membrane normal.     Nose: Mucosal edema and rhinorrhea present.     Right Sinus: Frontal sinus tenderness present. No maxillary sinus tenderness.     Left Sinus: Frontal sinus tenderness present. No maxillary sinus tenderness.     Mouth/Throat:     Pharynx: Uvula midline. No oropharyngeal exudate or posterior oropharyngeal erythema.  Eyes:     Conjunctiva/sclera: Conjunctivae normal.     Pupils: Pupils are equal, round, and reactive to light.  Cardiovascular:     Rate and Rhythm: Normal rate and regular rhythm.     Heart sounds: Normal heart sounds.  Pulmonary:     Effort: Pulmonary effort is normal. No  respiratory distress.     Breath sounds: Normal breath sounds. No wheezing.  Musculoskeletal:     Cervical back: Normal range of motion and neck supple.     Right lower leg: No edema.     Left lower leg: No edema.  Lymphadenopathy:     Cervical: No cervical adenopathy.  Skin:    General: Skin is warm and dry.     Findings: No rash.  Neurological:     General: No focal deficit present.     Mental Status: She is alert and oriented to person, place, and time.  Psychiatric:        Mood and Affect: Mood normal.        Behavior: Behavior normal.        Thought Content: Thought content normal.           Assessment & Plan:  Bacterial sinusitis- pt w/ sxs of frontal sinusitis.  Given her hx of DM will start abx.  Will start Doxy due to Amoxicillin allergy.  Pt cautioned about sun sensitivity.  Reviewed supportive care and red flags that should prompt return.  Pt expressed understanding and is in agreement w/ plan.

## 2020-05-09 NOTE — Patient Instructions (Signed)
Schedule your complete physical in 3-4 months We'll notify you of your lab results and make any changes if needed START the Doxycycline twice daily- take w/ food Drink LOTS of fluids Continue to work on healthy diet and regular exercise when you are feeling better Call with any questions or concerns Hang in there!

## 2020-05-09 NOTE — Assessment & Plan Note (Signed)
Chronic problem.  Adequate control today and well controlled at last visit.  No med changes at this time but will follow.

## 2020-05-10 ENCOUNTER — Encounter: Payer: Self-pay | Admitting: General Practice

## 2020-05-10 ENCOUNTER — Other Ambulatory Visit: Payer: Self-pay | Admitting: General Practice

## 2020-05-10 MED ORDER — DOXYCYCLINE HYCLATE 100 MG PO TABS: 100.0000 mg | ORAL_TABLET | Freq: Two times a day (BID) | ORAL | 0 refills | Status: DC

## 2020-05-20 ENCOUNTER — Other Ambulatory Visit: Payer: Self-pay | Admitting: Family Medicine

## 2020-05-21 DIAGNOSIS — H01005 Unspecified blepharitis left lower eyelid: Secondary | ICD-10-CM | POA: Diagnosis not present

## 2020-05-21 DIAGNOSIS — H04123 Dry eye syndrome of bilateral lacrimal glands: Secondary | ICD-10-CM | POA: Diagnosis not present

## 2020-05-22 ENCOUNTER — Other Ambulatory Visit: Payer: Self-pay | Admitting: Family Medicine

## 2020-05-23 DIAGNOSIS — R69 Illness, unspecified: Secondary | ICD-10-CM | POA: Diagnosis not present

## 2020-05-24 DIAGNOSIS — G4733 Obstructive sleep apnea (adult) (pediatric): Secondary | ICD-10-CM | POA: Diagnosis not present

## 2020-05-28 NOTE — Progress Notes (Signed)
Chronic Care Management Pharmacy  Name: [name]  MRN: [id number] DOB: [birth year]  PCP : Midge Minium, MD.  Chief Complaint/ HPI  [name],  64 y.o. , female presents for their Follow-Up CCM visit with the clinical pharmacist via telephone due to COVID-19 Pandemic.  Financial concerns regarding eye drops have caused considerable stress. We discussed PAP for Praluent and eye drops (Alphagan and Lumigan).   Previously discussed routine exercise for weight and mood. Is now walking dog every day. Has been difficult for her to go the gym due to ongoing stress.   Has not picked up most recent QVAR Rx, will let us know if cost is a concern here. Reports improvement in breathing following addition of singulair to allergy/asthma regimen. Has not recently used rescue inhaler.  Their chronic conditions include: HTN, HLD, DM, GERD, Depression, asthma  Office Visits: 05/09/2020 (PCP): a1c inc 6.7 to 6.9 - rec low carb diet and routine exercise. Schedule complete physical 3-4 months  03/07/2020 (PCP): Singulair added for asthma allergy triggers; inc h2o intake 11/13/2019 (PCP): Boil, residual infection treated with doxy.  Consult Visit: 2/19 (Respiratory Clinic): Chest congestion, mild persistent asthma with acute exacerbation. Zpak, benzonatate and ondansetron given. Unable to tolerate due to bipolar disorder.  Patient Active Problem List   Diagnosis Date Noted  . Allergic rhinitis due to pollen 12/13/2018  . Diet-controlled diabetes mellitus ([place]) 02/09/2018  . Statin intolerance 11/04/2017  . Physical exam 08/04/2017  . Family history of early CAD 03/10/2017  . HTN (hypertension) 02/15/2017  . Herniated nucleus pulposus, L5-S1 06/22/2014  . Asthma, moderate persistent 10/09/2013  . Fatigue 12/12/2012  . GERD (gastroesophageal reflux disease) 09/02/2012  . Vitamin D deficiency 09/15/2010  . DEPRESSIVE DISORDER 07/17/2010  . RHINITIS 06/14/2009  . MUSCLE WEAKNESS  (GENERALIZED) 01/03/2009  . Hyperlipidemia 10/10/2008  . BIPOLAR AFFECTIVE DISORDER 10/10/2008  . ADD 10/10/2008   Past Surgical History:  Procedure Laterality Date  . ABDOMINAL HYSTERECTOMY  2002   no oophorectomy  . BACK SURGERY  2001   L5 - S1  . CATARACT EXTRACTION    . NASAL SINUS SURGERY  01/2010   x2  . TONSILLECTOMY    . TOTAL KNEE ARTHROPLASTY  12/23/2011   Procedure: TOTAL KNEE ARTHROPLASTY;  Surgeon: Ninetta Lights, MD;  Location: [facility] [place];  Service: Orthopedics;  Laterality: Right;  DR MURPHY WANTS 120 MINUTES FOR SURGERY  . TOTAL KNEE ARTHROPLASTY  08/31/2012   left knee  . TOTAL KNEE ARTHROPLASTY  08/31/2012   Procedure: TOTAL KNEE ARTHROPLASTY;  Surgeon: Ninetta Lights, MD;  Location: [facility];  Service: Orthopedics;  Laterality: Left;   Social History   Socioeconomic History  . Marital status: Single    Spouse name: Not on file  . Number of children: 0  . Years of education: 26  . Highest education level: Not on file  Occupational History  . Occupation: retired//disability  Tobacco Use  . Smoking status: Never Smoker  . Smokeless tobacco: Never Used  Vaping Use  . Vaping Use: Never used  Substance and Sexual Activity  . Alcohol use: No    Alcohol/week: 0.0 standard drinks    Comment: Occasional glass of wine/"hardly ever"  . Drug use: No  . Sexual activity: Not Currently    Birth control/protection: Abstinence  Other Topics Concern  . Not on file  Social History Narrative   Lives alone with 2 cats. Has pet sitting business.   2 cups of coffee a  day, Rare soda    Social Determinants of Health   Financial Resource Strain:   . Difficulty of Paying Living Expenses:   Food Insecurity: No Food Insecurity  . Worried About Charity fundraiser in the Last Year: Never true  . Ran Out of Food in the Last Year: Never true  Transportation Needs: No Transportation Needs  . Lack of Transportation (Medical): No  . Lack of Transportation (Non-Medical): No    Physical Activity:   . Days of Exercise per Week:   . Minutes of Exercise per Session:   Stress:   . Feeling of Stress :   Social Connections:   . Frequency of Communication with Friends and Family:   . Frequency of Social Gatherings with Friends and Family:   . Attends Religious Services:   . Active Member of Clubs or Organizations:   . Attends Archivist Meetings:   Marland Kitchen Marital Status:    Family History  Problem Relation Age of Onset  . Coronary artery disease Father   . Heart attack Father 79  . Heart disease Father   . Lung disease Mother        Mycobacterium avium intracellulare  . Hypertension Brother   . Hyperlipidemia Brother   . Anesthesia problems Neg Hx    Allergies  Allergen Reactions  . Nitrofurantoin Monohyd Macro Nausea Only    Severe headache  . Statins Other (See Comments)    Severe weakness, pain  . Sulfa Antibiotics Nausea And Vomiting  . Alprazolam Anxiety and Other (See Comments)    Hyper   . Amoxicillin Rash  . Prednisone Anxiety   Outpatient Encounter Medications as of 05/29/2020  Medication Sig  . albuterol (VENTOLIN HFA) 108 (90 Base) MCG/ACT inhaler Inhale 2 puffs into the lungs every 6 (six) hours as needed for wheezing or shortness of breath.  . Alirocumab (PRALUENT) 150 MG/ML SOAJ Inject 150 mg into the skin every 14 (fourteen) days.  . ALPHAGAN P 0.1 % SOLN Place 1 drop into both eyes every 8 (eight) hours.   Marland Kitchen azelastine (ASTELIN) 0.1 % nasal spray PLACE 2 SPRAYS INTO EACH NOSTRIL TWO TIMES A DAY AS DIRECTED  . beclomethasone (QVAR REDIHALER) 80 MCG/ACT inhaler Inhale 2 puffs into the lungs 2 (two) times daily.  . Cholecalciferol (VITAMIN D) 2000 units tablet Take 2,000 Units by mouth daily.  . clonazePAM (KLONOPIN) 2 MG tablet 1 mg 2 (two) times daily.   . fenofibrate (TRICOR) 145 MG tablet TAKE ONE TABLET BY MOUTH DAILY  . fexofenadine (ALLEGRA) 180 MG tablet Take 180 mg by mouth daily.  . fluticasone (FLONASE) 50 MCG/ACT nasal  spray PLACE 2 SPRAYS INTO BOTH NOSTRILS DAILY  . gabapentin (NEURONTIN) 600 MG tablet Take 1,200 mg by mouth 2 (two) times daily.   Marland Kitchen lamoTRIgine (LAMICTAL) 200 MG tablet Take 200 mg by mouth 2 (two) times daily.   Marland Kitchen LUMIGAN 0.01 % SOLN Place 1 drop into both eyes at bedtime.   . metoprolol succinate (TOPROL-XL) 50 MG 24 hr tablet TAKE ONE TABLET BY MOUTH DAILY WITH A MEAL  . montelukast (SINGULAIR) 10 MG tablet Take 1 tablet (10 mg total) by mouth at bedtime.  . Multiple Vitamin (MULTIVITAMIN WITH MINERALS) TABS tablet Take 1 tablet by mouth daily.  . naproxen (NAPROSYN) 500 MG tablet Take 1 tablet (500 mg total) by mouth 2 (two) times daily.  Marland Kitchen OLANZapine (ZYPREXA) 10 MG tablet Take 10 mg by mouth daily as needed.  Marland Kitchen QUEtiapine (SEROQUEL  XR) 400 MG 24 hr tablet Take one tablet every evening at bedtime  . QUEtiapine (SEROQUEL) 50 MG tablet As needed  . dorzolamide-timolol (COSOPT) 22.3-6.8 MG/ML ophthalmic solution 1 drop 2 (two) times daily.  Marland Kitchen doxycycline (VIBRA-TABS) 100 MG tablet Take 1 tablet (100 mg total) by mouth 2 (two) times daily. (Patient not taking: Reported on 05/29/2020)  . estradiol (ESTRACE) 1 MG tablet Take 1 tablet (1 mg total) by mouth daily. (Patient not taking: Reported on 05/29/2020)  . methocarbamol (ROBAXIN) 500 MG tablet Take 1 tablet (500 mg total) by mouth every 8 (eight) hours as needed. (Patient not taking: Reported on 05/29/2020)  . omega-3 acid ethyl esters (LOVAZA) 1 g capsule Take 2 capsules (2 g total) by mouth 2 (two) times daily.  . ondansetron (ZOFRAN ODT) 4 MG disintegrating tablet Take 1 tablet (4 mg total) by mouth every 8 (eight) hours as needed for nausea or vomiting.  . venlafaxine (EFFEXOR-XR) 75 MG 24 hr capsule Take 225 mg by mouth daily.    No facility-administered encounter medications on file as of 05/29/2020.   Patient Care Team    Relationship Specialty Notifications Start End  Midge Minium, MD PCP - General   10/22/10    Comment: Merged  (Merged)  Midge Minium, MD    10/22/10    Comment: Elberta Spaniel, MD Consulting Physician Gynecology  07/28/17   Jettie Booze, MD Consulting Physician Cardiology  07/28/17   Leta Baptist, MD Consulting Physician Otolaryngology  07/28/17   Brayton Caves, MD Referring Physician Psychiatry  07/28/17   Janeth Rase, NP Nurse Practitioner Adult Health Nurse Practitioner  07/28/17   Darlyne Russian, LCSW Social Worker Licensed Clinical Social Worker  07/28/17   Star Age, MD Attending Physician Neurology  08/03/18   [name], [facility] Pharmacist Pharmacist  02/14/20    Comment: phone number [phone]   Current Diagnosis/Assessment: Goals Addressed            This Visit's Progress   . Patient Assistance Program       CARE PLAN ENTRY (see longitudinal plan of care for additional care plan information)  Current Barriers:  . Financial Barriers: patient has [employer] and reports copay is cost prohibitive at this time. Requesting assistance with o Alphagan o Lumigan o Praluent  Pharmacist Clinical Goal(s):  Marland Kitchen Over the next 30 days, patient will work with PharmD and providers to relieve medication access concerns  Interventions: . Comprehensive medication review completed; medication list updated in electronic medical record.  Bertram Savin care team collaboration (see longitudinal plan of care) . Reviewed application process. Patient will provide proof of income, out of pocket spend report, and will sign application. Will collaborate with corresponding prescribers for their portion of application. Once completed, will submit to patient assistance program.  Patient Self Care Activities:  . Patient will provide necessary portions of application   Initial goal documentation.       Hypertension   BP goal <140/90  BP Readings from Last 3 Encounters:  05/09/20 138/89  03/07/20 124/72  02/20/20 116/74   Pulse Readings from Last 3 Encounters:    05/09/20 79  03/07/20 80  02/20/20 70   Denies dizziness, SOB, chest pain. Patient is currently controlled on the following medications:   Metoprolol succinate ER 50mg  daily  We discussed: Exercise and diet recommendations (low carb diet). Is walking 30 minutes once every day.   Plan  Continue current medications.    Hyperlipidemia  LDL goal < 100     Component Value Date/Time   CHOL 165 12/14/2019 0924   CHOL 163 11/14/2018 1447   CHOL 312 (H) 06/19/2015 0924   TRIG 153.0 (H) 12/14/2019 0924   TRIG 657 (H) 06/19/2015 0924   HDL 64.60 12/14/2019 0924   HDL 71 11/14/2018 1447   HDL 44 06/19/2015 0924   CHOLHDL 3 12/14/2019 0924   VLDL 30.6 12/14/2019 0924   LDLCALC 70 12/14/2019 0924   LDLCALC 66 11/14/2018 1447   LDLCALC Comment 06/19/2015 0924   LDLDIRECT 123.0 01/26/2019 1030   LABVLDL 26 11/14/2018 1447   The 10-year ASCVD risk score (Goff DC Jr., et al., 2013) is: 9.6%   Values used to calculate the score:     Age: 47 years     Sex: Female     Is Non-Hispanic African American: No     Diabetic: Yes     Tobacco smoker: No     Systolic Blood Pressure: 740 mmHg     Is BP treated: Yes     HDL Cholesterol: 64.6 mg/dL     Total Cholesterol: 165 mg/dL   Previous medications: Repatha (cost), statins (intolerance).  LDL at goal 12/2019 lipid panel. Patient is currently controlled on the following medications:   Praluent 150 mg every 14 days  Fenofibrate 145 mg daily  Lovaza 2 g daily   We discussed: Exercise recommendations. Is walking 30 minutes once every day.   Plan  Continue current medications and control with diet and exercise.   Diabetes   Lab Results  Component Value Date/Time   HGBA1C 6.9 (H) 05/09/2020 10:58 AM   HGBA1C 6.7 (H) 12/14/2019 09:24 AM   MICROALBUR 0.9 03/07/2020 10:38 AM   MICROALBUR 1.0 01/26/2019 10:30 AM   Checking BG: Rarely Recent FBG Readings: n/a.  Patient is currently controlled on the following medications:   . Diet and exercise alone  We discussed: diet and exercise.   Plan  Continue current management.  GERD   Patient denies GERD associated symptoms including dysphagia, heartburn or nausea. Currently is not taking anything for acid reflux. No complaints at this time, controlled with diet.   Plan   Continue with diet.   Depression / Bipolar   PHQ 2: 1. Denies any recurring feelings of depression. Reports managing symptoms of anxiety/bipolar disorder well. Patient is currently controlled on:   Clonazepam 1 mg twice daily daily  Zyprexa 10 mg as needed  Venlafaxine XR 225 mg daily  Quetiapine IR 50 mg as needed   Quetiapine XR 400 mg every night  We discussed: PHQ 2/9 and PAP for medications (see medication management).   Plan  Continue current medications and exercise.    Asthma   Using albuterol rescue inhaler as needed, previously placed on QVAR but cost was too high. Is currently free of any symptoms, and denies any difficulty breathing or recent exacerbations.   We discussed patient assistance programs today. Patient will follow-up with pharmacist if wanting to get placed back on maintenance inhaler.   Vaccines   Immunization History  Administered Date(s) Administered  . Influenza Split 09/02/2012  . Influenza,inj,Quad PF,6+ Mos 10/09/2013, 08/13/2015, 08/17/2016, 07/28/2017, 08/03/2018, 08/08/2019  . Moderna SARS-COVID-2 Vaccination 02/27/2019, 01/27/2020  . Pneumococcal Polysaccharide-23 09/02/2012  . Tdap 07/15/2012  . Zoster Recombinat (Shingrix) 11/10/2018, 06/09/2019   Patient is up to date.   Plan  No recommendations at this time.   Medication Management   Pt uses Cytogeneticist for all medications.  Denies having any issues remembering to take medications or needing help with medication management outside of accessibility to Alphagan, Lumigan, Praluent.   Plan Continue current medication mangement.  [place] follow up: 1 month phone visit  (accessibility, DM, ensure f/u appts)   Pharmacist will coordinate PAP applications:  Alphagan   Lumigan  Praluent  ____________________________  [name], Pharm.D., BCGP Clinical Pharmacist [facility] [phone]

## 2020-05-29 ENCOUNTER — Ambulatory Visit: Payer: Medicare HMO

## 2020-05-29 ENCOUNTER — Other Ambulatory Visit: Payer: Self-pay

## 2020-05-29 DIAGNOSIS — I10 Essential (primary) hypertension: Secondary | ICD-10-CM

## 2020-05-29 DIAGNOSIS — E782 Mixed hyperlipidemia: Secondary | ICD-10-CM

## 2020-05-29 DIAGNOSIS — E119 Type 2 diabetes mellitus without complications: Secondary | ICD-10-CM

## 2020-05-30 DIAGNOSIS — R69 Illness, unspecified: Secondary | ICD-10-CM | POA: Diagnosis not present

## 2020-05-30 NOTE — Patient Instructions (Addendum)
Please call me at [phone] (direct line) with any questions - thank you!  - Edyth Gunnels., Clinical Pharmacist  Goals Addressed            This Visit's Progress   . Patient Assistance Program       CARE PLAN ENTRY (see longitudinal plan of care for additional care plan information)  Current Barriers:  . Financial Barriers: patient has [employer] and reports copay is cost prohibitive at this time. Requesting assistance with o Alphagan o Lumigan o Praluent  Pharmacist Clinical Goal(s):  Marland Kitchen Over the next 30 days, patient will work with PharmD and providers to relieve medication access concerns  Interventions: . Comprehensive medication review completed; medication list updated in electronic medical record.  Bertram Savin care team collaboration (see longitudinal plan of care) . Reviewed application process. Patient will provide proof of income, out of pocket spend report, and will sign application. Will collaborate with corresponding prescribers for their portion of application. Once completed, will submit to patient assistance program.  Patient Self Care Activities:  . Patient will provide necessary portions of application   Initial goal documentation.       Ms. Borys was given information about Chronic Care Management services today including:  1. CCM service includes personalized support from designated clinical staff supervised by her physician, including individualized plan of care and coordination with other care providers 2. 24/7 contact phone numbers for assistance for urgent and routine care needs. 3. Standard insurance, coinsurance, copays and deductibles apply for chronic care management only during months in which we provide at least 20 minutes of these services. Most insurances cover these services at 100%, however patients may be responsible for any copay, coinsurance and/or deductible if applicable. This service may help you avoid the need for more expensive  face-to-face services. 4. Only one practitioner may furnish and bill the service in a calendar month. 5. The patient may stop CCM services at any time (effective at the end of the month) by phone call to the office staff.  Patient agreed to services and verbal consent obtained.   The patient verbalized understanding of instructions provided today and agreed to receive a mailed copy of patient instruction and/or educational materials. Telephone follow up appointment with pharmacy team member scheduled for: See next appointment with "Care Management Staff" under "What's Next" below.   [name], Pharm.D., BCGP Clinical Pharmacist [facility] [phone]

## 2020-06-01 DIAGNOSIS — G4733 Obstructive sleep apnea (adult) (pediatric): Secondary | ICD-10-CM | POA: Diagnosis not present

## 2020-06-04 ENCOUNTER — Ambulatory Visit: Payer: Self-pay | Admitting: Neurology

## 2020-06-06 ENCOUNTER — Telehealth: Payer: Self-pay

## 2020-06-06 DIAGNOSIS — R69 Illness, unspecified: Secondary | ICD-10-CM | POA: Diagnosis not present

## 2020-06-06 NOTE — Progress Notes (Signed)
Ensured receipt of PAP applications, answered patients questions pertaining to this process. Pt agrees to complete applications and drop off at office for me to coordinate remaining steps.

## 2020-06-11 ENCOUNTER — Ambulatory Visit: Payer: Medicare HMO | Admitting: Adult Health

## 2020-06-11 ENCOUNTER — Telehealth: Payer: Self-pay | Admitting: Obstetrics & Gynecology

## 2020-06-11 ENCOUNTER — Other Ambulatory Visit: Payer: Self-pay

## 2020-06-11 ENCOUNTER — Encounter: Payer: Self-pay | Admitting: Adult Health

## 2020-06-11 VITALS — BP 119/74 | HR 77 | Ht 69.0 in | Wt 225.0 lb

## 2020-06-11 DIAGNOSIS — Z9989 Dependence on other enabling machines and devices: Secondary | ICD-10-CM | POA: Diagnosis not present

## 2020-06-11 DIAGNOSIS — G4733 Obstructive sleep apnea (adult) (pediatric): Secondary | ICD-10-CM | POA: Diagnosis not present

## 2020-06-11 DIAGNOSIS — G4719 Other hypersomnia: Secondary | ICD-10-CM | POA: Diagnosis not present

## 2020-06-11 NOTE — Progress Notes (Addendum)
[facility] [address] [place]. [place] [address] [phone]       OFFICE FOLLOW UP NOTE  [name] Date of Birth:  1956/02/27 Medical Record Number:  [id number]   Reason for visit: Initial CPAP    SUBJECTIVE:   CHIEF COMPLAINT:  Chief Complaint  Patient presents with  . Follow-up    rm 1 here for an OSA f/u. Pt said she is waking up sweating.    HPI:    Today, 06/11/2020, [name] is being seen for CPAP compliance visit.  Reevaluated by Dr. Rexene Alberts on 02/13/2020 for obstructive sleep apnea on CPAP due to subjective complaints of severe drowsiness and sleepiness while driving.  HST completed on 03/11/2020 which confirmed OSA and mild/new moderate range and recommended obtaining new CPAP machine with current setting of 11 and EPR level 3 (consistent with prior settings).  Compliance report from 05/11/2020 -06/09/2020 shows 27 out of 30 usage days with 26 days greater than 4 hours for 87% compliance.  Residual AHI 1.8 on set pressure of 11 cm H2O and EPR level 1.  Leaks in the 95th percentile 13.9.   She has since received a new machine but continues to trial different mask types due to difficulty tolerating nasal pillows.  She returned to prior mask which she tolerates better.  She does continue to experience daytime fatigue but believes this is more due to daily life stressors as well as multiple psych medications.  Also reports over the past 2 weeks she has been waking up in the middle of the night sweating at times soaking through her clothes.  She reports recently decreasing estradiol as advised by her OB/GYN.  She is concerned about this and unsure if related to anxiety, CPAP or other condition.  She has not discussed this further with PCP, OB/GYN or psychiatrist.  ESS: 4 FSS: 35    ROS:   14 system review of systems performed and negative with exception of see HPI  PMH:  Past Medical History:  Diagnosis Date  . Allergic rhinitis   . Anxiety   .  Arthritis    R knee, Left Knee  . Asthma   . Bipolar disorder ([place])   . Cataract   . Complication of anesthesia    had bad anxiety after anesthesia  . Constipation   . Constipation, chronic   . Depression   . Diabetes mellitus without complication ([place])   . Dysrhythmia    Palpations occ, takes Tenormin  . GERD (gastroesophageal reflux disease)   . Heart murmur   . Hyperlipidemia   . Hypertension   . MVP (mitral valve prolapse)   . Urinary tract infection    frequent, see Urololgist 12/16/2010    PSH:  Past Surgical History:  Procedure Laterality Date  . ABDOMINAL HYSTERECTOMY  2002   no oophorectomy  . BACK SURGERY  2001   L5 - S1  . CATARACT EXTRACTION    . NASAL SINUS SURGERY  01/2010   x2  . TONSILLECTOMY    . TOTAL KNEE ARTHROPLASTY  12/23/2011   Procedure: TOTAL KNEE ARTHROPLASTY;  Surgeon: Ninetta Lights, MD;  Location: [facility];  Service: Orthopedics;  Laterality: Right;  DR MURPHY WANTS 120 MINUTES FOR SURGERY  . TOTAL KNEE ARTHROPLASTY  08/31/2012   left knee  . TOTAL KNEE ARTHROPLASTY  08/31/2012   Procedure: TOTAL KNEE ARTHROPLASTY;  Surgeon: Ninetta Lights, MD;  Location: [facility];  Service: Orthopedics;  Laterality: Left;  Social History:  Social History   Socioeconomic History  . Marital status: Single    Spouse name: Not on file  . Number of children: 0  . Years of education: 7  . Highest education level: Not on file  Occupational History  . Occupation: retired//disability  Tobacco Use  . Smoking status: Never Smoker  . Smokeless tobacco: Never Used  Vaping Use  . Vaping Use: Never used  Substance and Sexual Activity  . Alcohol use: No    Alcohol/week: 0.0 standard drinks    Comment: Occasional glass of wine/"hardly ever"  . Drug use: No  . Sexual activity: Not Currently    Birth control/protection: Abstinence  Other Topics Concern  . Not on file  Social History Narrative   Lives alone with 2 cats. Has pet sitting business.   2 cups of  coffee a day, Rare soda    Social Determinants of Health   Financial Resource Strain:   . Difficulty of Paying Living Expenses:   Food Insecurity: No Food Insecurity  . Worried About Charity fundraiser in the Last Year: Never true  . Ran Out of Food in the Last Year: Never true  Transportation Needs: No Transportation Needs  . Lack of Transportation (Medical): No  . Lack of Transportation (Non-Medical): No  Physical Activity:   . Days of Exercise per Week:   . Minutes of Exercise per Session:   Stress:   . Feeling of Stress :   Social Connections:   . Frequency of Communication with Friends and Family:   . Frequency of Social Gatherings with Friends and Family:   . Attends Religious Services:   . Active Member of Clubs or Organizations:   . Attends Archivist Meetings:   Marland Kitchen Marital Status:   Intimate Partner Violence:   . Fear of Current or Ex-Partner:   . Emotionally Abused:   Marland Kitchen Physically Abused:   . Sexually Abused:     Family History:  Family History  Problem Relation Age of Onset  . Coronary artery disease Father   . Heart attack Father 44  . Heart disease Father   . Lung disease Mother        Mycobacterium avium intracellulare  . Hypertension Brother   . Hyperlipidemia Brother   . Anesthesia problems Neg Hx     Medications:   Current Outpatient Medications on File Prior to Visit  Medication Sig Dispense Refill  . albuterol (VENTOLIN HFA) 108 (90 Base) MCG/ACT inhaler Inhale 2 puffs into the lungs every 6 (six) hours as needed for wheezing or shortness of breath. 6.7 g 2  . Alirocumab (PRALUENT) 150 MG/ML SOAJ Inject 150 mg into the skin every 14 (fourteen) days. 2 pen 11  . ALPHAGAN P 0.1 % SOLN Place 1 drop into both eyes every 8 (eight) hours.     Marland Kitchen azelastine (ASTELIN) 0.1 % nasal spray PLACE 2 SPRAYS INTO EACH NOSTRIL TWO TIMES A DAY AS DIRECTED 30 mL 2  . beclomethasone (QVAR REDIHALER) 80 MCG/ACT inhaler Inhale 2 puffs into the lungs 2 (two)  times daily. 10.6 g 3  . Cholecalciferol (VITAMIN D) 2000 units tablet Take 2,000 Units by mouth daily.    . clonazePAM (KLONOPIN) 2 MG tablet 1 mg 2 (two) times daily.   0  . doxycycline (VIBRA-TABS) 100 MG tablet Take 1 tablet (100 mg total) by mouth 2 (two) times daily. 20 tablet 0  . estradiol (ESTRACE) 1 MG tablet Take 1 tablet (1  mg total) by mouth daily. 90 tablet 4  . fenofibrate (TRICOR) 145 MG tablet TAKE ONE TABLET BY MOUTH DAILY 30 tablet 3  . fexofenadine (ALLEGRA) 180 MG tablet Take 180 mg by mouth daily.    . fluticasone (FLONASE) 50 MCG/ACT nasal spray PLACE 2 SPRAYS INTO BOTH NOSTRILS DAILY 16 g 5  . gabapentin (NEURONTIN) 600 MG tablet Take 1,200 mg by mouth 2 (two) times daily.     Marland Kitchen lamoTRIgine (LAMICTAL) 200 MG tablet Take 200 mg by mouth 2 (two) times daily.     Marland Kitchen LUMIGAN 0.01 % SOLN Place 1 drop into both eyes at bedtime.     . methocarbamol (ROBAXIN) 500 MG tablet Take 1 tablet (500 mg total) by mouth every 8 (eight) hours as needed. 15 tablet 0  . metoprolol succinate (TOPROL-XL) 50 MG 24 hr tablet TAKE ONE TABLET BY MOUTH DAILY WITH A MEAL 90 tablet 0  . montelukast (SINGULAIR) 10 MG tablet Take 1 tablet (10 mg total) by mouth at bedtime. 30 tablet 3  . Multiple Vitamin (MULTIVITAMIN WITH MINERALS) TABS tablet Take 1 tablet by mouth daily.    . naproxen (NAPROSYN) 500 MG tablet Take 1 tablet (500 mg total) by mouth 2 (two) times daily. 10 tablet 0  . OLANZapine (ZYPREXA) 10 MG tablet Take 10 mg by mouth daily as needed.    Marland Kitchen omega-3 acid ethyl esters (LOVAZA) 1 g capsule Take 2 capsules (2 g total) by mouth 2 (two) times daily. 360 capsule 3  . ondansetron (ZOFRAN ODT) 4 MG disintegrating tablet Take 1 tablet (4 mg total) by mouth every 8 (eight) hours as needed for nausea or vomiting. 20 tablet 0  . QUEtiapine (SEROQUEL XR) 400 MG 24 hr tablet Take one tablet every evening at bedtime  0  . QUEtiapine (SEROQUEL) 50 MG tablet As needed  3  . venlafaxine (EFFEXOR-XR) 75  MG 24 hr capsule Take 225 mg by mouth daily.      No current facility-administered medications on file prior to visit.    Allergies:   Allergies  Allergen Reactions  . Nitrofurantoin Monohyd Macro Nausea Only    Severe headache  . Statins Other (See Comments)    Severe weakness, pain  . Sulfa Antibiotics Nausea And Vomiting  . Alprazolam Anxiety and Other (See Comments)    Hyper   . Amoxicillin Rash  . Prednisone Anxiety      OBJECTIVE:  Physical Exam  Vitals:   06/11/20 1056  BP: 119/74  Pulse: 77  Weight: 225 lb (102.1 kg)  Height: 5\' 9"  (1.753 m)   Body mass index is 33.23 kg/m. No exam data present  General: well developed, well nourished,  pleasant middle-age Caucasian female, seated, in no evident distress Head: head normocephalic and atraumatic.   Neck: supple with no carotid or supraclavicular bruits Cardiovascular: regular rate and rhythm, no murmurs Musculoskeletal: no deformity Skin:  no rash/petichiae Vascular:  Normal pulses all extremities   Neurologic Exam Mental Status: Awake and fully alert. Oriented to place and time. Recent and remote memory intact. Attention span, concentration and fund of knowledge appropriate. Mood and affect appropriate.  Cranial Nerves: Pupils equal, briskly reactive to light. Extraocular movements full without nystagmus. Visual fields full to confrontation. Hearing intact. Facial sensation intact. Face, tongue, palate moves normally and symmetrically.  Motor: Normal bulk and tone. Normal strength in all tested extremity muscles. Sensory.: intact to touch , pinprick , position and vibratory sensation.  Coordination: Rapid alternating movements normal  in all extremities. Finger-to-nose and heel-to-shin performed accurately bilaterally. Gait and Station: Arises from chair without difficulty. Stance is normal. Gait demonstrates normal stride length and balance Reflexes: 1+ and symmetric. Toes downgoing.        ASSESSMENT/PLAN: [name] is a 64 y.o. year old female previously followed in this office by Dr. Rexene Alberts for OSA on CPAP and recently referred back on 02/13/2020 for reevaluation due to daytime fatigue and especially sleepy while driving.  It is noted that she is on multiple potentially sedating medications followed by psychiatry for history of bipolar and anxiety.  Repeat sleep study on 03/11/2020 confirm moderate sleep apnea and prescribed new machine without indication for setting changes.   Greater than 4-hour compliance 87% with residual AHI 1.8. Continue set pressure of 11 cm H2O, but current EPR level 1 and recommended by Dr. Rexene Alberts to remain at 3 -this will be requested in new order Continue to follow with DME company for any needed supplies or CPAP related concerns Encourage continue compliance and follow-up with psychiatry to discuss multiple sedating medications possibly contributing to fatigue Advised to follow-up with OB/GYN or PCP in regards to night sweats concerns     Follow up in 6 months or call earlier if needed   I spent 25 minutes of face-to-face and non-face-to-face time with patient.  This included previsit chart review, lab review, study review, order entry, electronic health record documentation, patient education regarding compliance report for sleep apnea, importance of ongoing use, night sweat concerns and answered all questions to patient satisfaction   [name], AGNP-BC  [facility] [address] [place], [place] [address]  Phone [phone] Fax [phone] Note: This document was prepared with digital dictation and possible smart phrase technology. Any transcriptional errors that result from this process are unintentional.  I reviewed the above note and documentation by the Nurse Practitioner and agree with the history, exam, assessment and plan as outlined above. I was available for consultation. Star Age, MD, PhD [facility])

## 2020-06-11 NOTE — Telephone Encounter (Signed)
AEX 06/2019 Hysterectomy  Last MMG at [facility]- see report 04/10/20- Birads 1, Neg  Spoke with pt. Pt states having night sweats and hot flashes return for about 1 month now. Pt states stopped/weaned off Estrace tablets in Nov/Dec and was good until vasomotor sx returned. Pt denies any other vasomotor sx, fever, chills.  Pt states is waking up drenched and feeling hot during the day. Pt wanting to know if can have Rx refilled and start taking until AEX? Next AEX in 09/12/20.  Pt advised will review with Dr Sabra Heck and return call with recommendations. Pt agreeable.   Routing to Dr Sabra Heck.

## 2020-06-11 NOTE — Telephone Encounter (Signed)
Patient is having "night sweats and would like to restart HRT".

## 2020-06-12 NOTE — Telephone Encounter (Signed)
She was taking 1/2 of 1.0mg  estradiol daily.  Please confirm this is what she was taking before she stopped.  If she'd gotten to a lower dosage that was controlling HRT, this is the dosage I would recommend she use if restarting.  Please remind her that all of the newly starting HRT risks will exist with her--MI, Stroke, DVT, PE and also increased breast cancer risk exists.  Please give me update about dosage.

## 2020-06-12 NOTE — Telephone Encounter (Signed)
Spoke with pt. Pt states going to pharmacy yesterday and picking up last 3 month supply from original Rx 06/2019. Pt states is taking 1/2 of 1.0mg  daily and since picked up Rx yesterday, pt does not need refill. Will have enough to take til  AEX 09/12/20. Risks reviewed with pt about restarting HRT, pt agreeable and verbalized understanding.  Advised will give update to Dr Sabra Heck. Pt agreeable.  Routing to Dr Sabra Heck  Encounter closed.

## 2020-06-13 NOTE — Progress Notes (Signed)
Order for cpap supplies sent to [facility] via community msg. Confirmation received that the order transmitted was successful. 

## 2020-06-20 DIAGNOSIS — R69 Illness, unspecified: Secondary | ICD-10-CM | POA: Diagnosis not present

## 2020-06-24 DIAGNOSIS — G4733 Obstructive sleep apnea (adult) (pediatric): Secondary | ICD-10-CM | POA: Diagnosis not present

## 2020-06-28 ENCOUNTER — Telehealth: Payer: Medicare HMO

## 2020-07-02 DIAGNOSIS — G4733 Obstructive sleep apnea (adult) (pediatric): Secondary | ICD-10-CM | POA: Diagnosis not present

## 2020-07-05 ENCOUNTER — Encounter: Payer: Self-pay | Admitting: Family Medicine

## 2020-07-05 ENCOUNTER — Other Ambulatory Visit: Payer: Self-pay

## 2020-07-05 ENCOUNTER — Ambulatory Visit (INDEPENDENT_AMBULATORY_CARE_PROVIDER_SITE_OTHER): Payer: Medicare HMO | Admitting: Family Medicine

## 2020-07-05 VITALS — BP 121/81 | HR 80 | Temp 98.4°F | Resp 16 | Ht 69.0 in | Wt 225.4 lb

## 2020-07-05 DIAGNOSIS — R5383 Other fatigue: Secondary | ICD-10-CM

## 2020-07-05 DIAGNOSIS — M6281 Muscle weakness (generalized): Secondary | ICD-10-CM

## 2020-07-05 LAB — HEPATIC FUNCTION PANEL
ALT: 18 U/L (ref 0–35)
AST: 13 U/L (ref 0–37)
Albumin: 4.7 g/dL (ref 3.5–5.2)
Alkaline Phosphatase: 19 U/L — ABNORMAL LOW (ref 39–117)
Bilirubin, Direct: 0.1 mg/dL (ref 0.0–0.3)
Total Bilirubin: 0.6 mg/dL (ref 0.2–1.2)
Total Protein: 6.9 g/dL (ref 6.0–8.3)

## 2020-07-05 LAB — TSH: TSH: 1.13 u[IU]/mL (ref 0.35–4.50)

## 2020-07-05 LAB — CBC WITH DIFFERENTIAL/PLATELET
Basophils Absolute: 0 10*3/uL (ref 0.0–0.1)
Basophils Relative: 0.2 % (ref 0.0–3.0)
Eosinophils Absolute: 0 10*3/uL (ref 0.0–0.7)
Eosinophils Relative: 0.1 % (ref 0.0–5.0)
HCT: 35.2 % — ABNORMAL LOW (ref 36.0–46.0)
Hemoglobin: 12.2 g/dL (ref 12.0–15.0)
Lymphocytes Relative: 36.8 % (ref 12.0–46.0)
Lymphs Abs: 1.3 10*3/uL (ref 0.7–4.0)
MCHC: 34.5 g/dL (ref 30.0–36.0)
MCV: 89.2 fl (ref 78.0–100.0)
Monocytes Absolute: 0.2 10*3/uL (ref 0.1–1.0)
Monocytes Relative: 6.3 % (ref 3.0–12.0)
Neutro Abs: 2.1 10*3/uL (ref 1.4–7.7)
Neutrophils Relative %: 56.6 % (ref 43.0–77.0)
Platelets: 233 10*3/uL (ref 150.0–400.0)
RBC: 3.95 Mil/uL (ref 3.87–5.11)
RDW: 13.9 % (ref 11.5–15.5)
WBC: 3.6 10*3/uL — ABNORMAL LOW (ref 4.0–10.5)

## 2020-07-05 LAB — BASIC METABOLIC PANEL
BUN: 14 mg/dL (ref 6–23)
CO2: 27 mEq/L (ref 19–32)
Calcium: 9.9 mg/dL (ref 8.4–10.5)
Chloride: 102 mEq/L (ref 96–112)
Creatinine, Ser: 0.57 mg/dL (ref 0.40–1.20)
GFR: 107.16 mL/min (ref >60.00)
Glucose, Bld: 104 mg/dL — ABNORMAL HIGH (ref 70–99)
Potassium: 4.2 mEq/L (ref 3.5–5.1)
Sodium: 138 mEq/L (ref 135–145)

## 2020-07-05 LAB — B12 AND FOLATE PANEL
Folate: >24.8 ng/mL (ref >5.9)
Vitamin B-12: 267 pg/mL (ref 211–911)

## 2020-07-05 LAB — T3, FREE: T3, Free: 3.7 pg/mL (ref 2.3–4.2)

## 2020-07-05 LAB — SEDIMENTATION RATE: Sed Rate: 3 mm/hr (ref 0–30)

## 2020-07-05 LAB — T4, FREE: Free T4: 0.68 ng/dL (ref 0.60–1.60)

## 2020-07-05 NOTE — Assessment & Plan Note (Signed)
Deteriorated.  Pt has hx of this previously but has not had issues w/ it recently.  Will check rheumatologic labs and ESR.  If no improvement or obvious cause based on lab work, will consider rheum or neuro referral.  Pt expressed understanding and is in agreement w/ plan.

## 2020-07-05 NOTE — Progress Notes (Signed)
° °  Subjective:    Patient ID: [name], female    DOB: [birth year], 64 y.o.   MRN: [id number]  HPI Fatigue- 'I don't know why I'm feeling like this.  I'm just exhausted'.  Reports body 'just feels weak', 'my legs will feel like jello'.  + episodic sweating.  No N/V.  No fevers.  Reports sleeping well at night.  Using CPAP nightly.  Is now having to nap during the day 'to get through'.  Was previously on Estradiol but weaned herself off it.  'i've just never had this'.  Pt reports she thought sxs would improve when they started 'a couple of weeks ago'.   Review of Systems For ROS see HPI   This visit occurred during the SARS-CoV-2 public health emergency.  Safety protocols were in place, including screening questions prior to the visit, additional usage of staff PPE, and extensive cleaning of exam room while observing appropriate contact time as indicated for disinfecting solutions.       Objective:   Physical Exam Vitals reviewed.  Constitutional:      General: She is not in acute distress.    Appearance: She is well-developed. She is obese.  HENT:     Head: Normocephalic and atraumatic.  Eyes:     Conjunctiva/sclera: Conjunctivae normal.     Pupils: Pupils are equal, round, and reactive to light.  Neck:     Thyroid: No thyromegaly.  Cardiovascular:     Rate and Rhythm: Normal rate and regular rhythm.     Heart sounds: Normal heart sounds. No murmur heard.   Pulmonary:     Effort: Pulmonary effort is normal. No respiratory distress.     Breath sounds: Normal breath sounds.  Abdominal:     General: There is no distension.     Palpations: Abdomen is soft.     Tenderness: There is no abdominal tenderness.  Musculoskeletal:     Cervical back: Normal range of motion and neck supple.  Lymphadenopathy:     Cervical: No cervical adenopathy.  Skin:    General: Skin is warm and dry.  Neurological:     Mental Status: She is alert and oriented to person, place, and time.    Psychiatric:        Behavior: Behavior normal.           Assessment & Plan:

## 2020-07-05 NOTE — Patient Instructions (Signed)
We'll notify you of your lab results and determine the next steps Make sure you are getting plenty of rest Make sure you're drinking plenty of water and eating regularly so that your body has enough fuel to get through the day Continue to use your CPAP nightly Call with any questions or concerns Hang in there!

## 2020-07-05 NOTE — Assessment & Plan Note (Signed)
Recurrent problem, but has worsened over the last few weeks.  Pt reports compliance w/ CPAP.  Has weaned off Estradiol which could be contributing to sxs.  Will check labs to determine if there is a metabolic cause.  The results of the labs will determine the next steps

## 2020-07-08 ENCOUNTER — Encounter: Payer: Self-pay | Admitting: General Practice

## 2020-07-11 ENCOUNTER — Telehealth: Payer: Self-pay | Admitting: Family Medicine

## 2020-07-11 DIAGNOSIS — R69 Illness, unspecified: Secondary | ICD-10-CM | POA: Diagnosis not present

## 2020-07-11 LAB — ANTI-NUCLEAR AB-TITER (ANA TITER): ANA Titer 1: 1:40 {titer} — ABNORMAL HIGH

## 2020-07-11 LAB — ANA: Anti Nuclear Antibody (ANA): POSITIVE — AB

## 2020-07-11 NOTE — Telephone Encounter (Signed)
Pt is going to pick up vitamin D OTC as when we reviewed her chart she has not been taking this.

## 2020-07-11 NOTE — Telephone Encounter (Signed)
Patient called and states that she is still feeling weak - she would like to know if she can come in an get a B12 injection tomorrow - Patient states she is taking all of her supplements but she still feel tired and weak.  Please advise

## 2020-07-11 NOTE — Telephone Encounter (Signed)
Please advise pt B12 was normal.

## 2020-07-11 NOTE — Telephone Encounter (Signed)
Please let pt know that unfortunately since B12 is in normal range, insurance will not cover B12 injxns.  She should continue to take OTC B12 supplements.

## 2020-07-12 ENCOUNTER — Other Ambulatory Visit: Payer: Self-pay | Admitting: Family Medicine

## 2020-07-12 ENCOUNTER — Other Ambulatory Visit: Payer: Self-pay | Admitting: General Practice

## 2020-07-12 DIAGNOSIS — R768 Other specified abnormal immunological findings in serum: Secondary | ICD-10-CM

## 2020-07-12 DIAGNOSIS — R5383 Other fatigue: Secondary | ICD-10-CM

## 2020-07-12 MED ORDER — MONTELUKAST SODIUM 10 MG PO TABS
10.0000 mg | ORAL_TABLET | Freq: Every day | ORAL | 1 refills | Status: DC
Start: 2020-07-12 — End: 2023-01-01

## 2020-07-17 DIAGNOSIS — R69 Illness, unspecified: Secondary | ICD-10-CM | POA: Diagnosis not present

## 2020-07-18 ENCOUNTER — Telehealth: Payer: Medicare HMO

## 2020-07-18 ENCOUNTER — Telehealth: Payer: Self-pay | Admitting: Family Medicine

## 2020-07-18 NOTE — Telephone Encounter (Signed)
[facility] declined the referral and [place] office is scheduled out to Feb 2022, please advise if it is ok to refer to [facility] rheumatology.

## 2020-07-19 NOTE — Telephone Encounter (Signed)
Please advise 

## 2020-07-19 NOTE — Telephone Encounter (Signed)
Ok to refer to [facility]

## 2020-07-19 NOTE — Telephone Encounter (Signed)
Pt got scheduled with Deveshwar's office on 08/14/2020.

## 2020-07-20 DIAGNOSIS — R69 Illness, unspecified: Secondary | ICD-10-CM | POA: Diagnosis not present

## 2020-07-25 ENCOUNTER — Ambulatory Visit: Payer: Medicare HMO

## 2020-07-25 DIAGNOSIS — G4733 Obstructive sleep apnea (adult) (pediatric): Secondary | ICD-10-CM | POA: Diagnosis not present

## 2020-07-25 DIAGNOSIS — E782 Mixed hyperlipidemia: Secondary | ICD-10-CM

## 2020-07-25 NOTE — Progress Notes (Signed)
Chronic Care Management Pharmacy  Name: [name]  MRN: [id number] DOB: Dec 27, 1955  PCP : Midge Minium, MD.  Chief Complaint/ HPI  [name],  64 y.o. , female presents for their Follow-Up CCM visit with the clinical pharmacist via telephone due to COVID-19 Pandemic.  Their chronic conditions include: HTN, HLD, DM, GERD, Depression, asthma  Office Visits: 07/05/2020 (PCP): c/o muscle weakness, positive ana, referred to rheumatology  05/09/2020 (PCP): a1c inc 6.7 to 6.9 - rec low carb diet and routine exercise. Schedule complete physical 3-4 months  03/07/2020 (PCP): Singulair added for asthma allergy triggers; inc h2o intake 11/13/2019 (PCP): Boil, residual infection treated with doxy.  Consult Visit: 2/19 (Respiratory Clinic): Chest congestion, mild persistent asthma with acute exacerbation. Zpak, benzonatate and ondansetron given. Unable to tolerate due to bipolar disorder.  Patient Active Problem List   Diagnosis Date Noted  . Allergic rhinitis due to pollen 12/13/2018  . Diet-controlled diabetes mellitus ([place]) 02/09/2018  . Statin intolerance 11/04/2017  . Physical exam 08/04/2017  . Family history of early CAD 03/10/2017  . HTN (hypertension) 02/15/2017  . Herniated nucleus pulposus, L5-S1 06/22/2014  . Asthma, moderate persistent 10/09/2013  . Fatigue 12/12/2012  . GERD (gastroesophageal reflux disease) 09/02/2012  . Vitamin D deficiency 09/15/2010  . DEPRESSIVE DISORDER 07/17/2010  . RHINITIS 06/14/2009  . MUSCLE WEAKNESS (GENERALIZED) 01/03/2009  . Hyperlipidemia 10/10/2008  . BIPOLAR AFFECTIVE DISORDER 10/10/2008  . ADD 10/10/2008   Past Surgical History:  Procedure Laterality Date  . ABDOMINAL HYSTERECTOMY  2002   no oophorectomy  . BACK SURGERY  2001   L5 - S1  . CATARACT EXTRACTION    . NASAL SINUS SURGERY  01/2010   x2  . TONSILLECTOMY    . TOTAL KNEE ARTHROPLASTY  12/23/2011   Procedure: TOTAL KNEE ARTHROPLASTY;  Surgeon: Ninetta Lights,  MD;  Location: [facility];  Service: Orthopedics;  Laterality: Right;  DR MURPHY WANTS 120 MINUTES FOR SURGERY  . TOTAL KNEE ARTHROPLASTY  08/31/2012   left knee  . TOTAL KNEE ARTHROPLASTY  08/31/2012   Procedure: TOTAL KNEE ARTHROPLASTY;  Surgeon: Ninetta Lights, MD;  Location: [facility];  Service: Orthopedics;  Laterality: Left;   Social History   Socioeconomic History  . Marital status: Single    Spouse name: Not on file  . Number of children: 0  . Years of education: 17  . Highest education level: Not on file  Occupational History  . Occupation: retired//disability  Tobacco Use  . Smoking status: Never Smoker  . Smokeless tobacco: Never Used  Vaping Use  . Vaping Use: Never used  Substance and Sexual Activity  . Alcohol use: No    Alcohol/week: 0.0 standard drinks    Comment: Occasional glass of wine/"hardly ever"  . Drug use: No  . Sexual activity: Not Currently    Birth control/protection: Abstinence  Other Topics Concern  . Not on file  Social History Narrative   Lives alone with 2 cats. Has pet sitting business.   2 cups of coffee a day, Rare soda    Social Determinants of Health   Financial Resource Strain:   . Difficulty of Paying Living Expenses: Not on file  Food Insecurity: No Food Insecurity  . Worried About Charity fundraiser in the Last Year: Never true  . Ran Out of Food in the Last Year: Never true  Transportation Needs: No Transportation Needs  . Lack of Transportation (Medical): No  . Lack of Transportation (  Non-Medical): No  Physical Activity:   . Days of Exercise per Week: Not on file  . Minutes of Exercise per Session: Not on file  Stress:   . Feeling of Stress : Not on file  Social Connections:   . Frequency of Communication with Friends and Family: Not on file  . Frequency of Social Gatherings with Friends and Family: Not on file  . Attends Religious Services: Not on file  . Active Member of Clubs or Organizations: Not on file  . Attends Theatre manager Meetings: Not on file  . Marital Status: Not on file   Family History  Problem Relation Age of Onset  . Coronary artery disease Father   . Heart attack Father 85  . Heart disease Father   . Lung disease Mother        Mycobacterium avium intracellulare  . Hypertension Brother   . Hyperlipidemia Brother   . Anesthesia problems Neg Hx    Allergies  Allergen Reactions  . Nitrofurantoin Monohyd Macro Nausea Only    Severe headache  . Statins Other (See Comments)    Severe weakness, pain  . Sulfa Antibiotics Nausea And Vomiting  . Alprazolam Anxiety and Other (See Comments)    Hyper   . Amoxicillin Rash  . Prednisone Anxiety   Outpatient Encounter Medications as of 07/25/2020  Medication Sig  . albuterol (VENTOLIN HFA) 108 (90 Base) MCG/ACT inhaler Inhale 2 puffs into the lungs every 6 (six) hours as needed for wheezing or shortness of breath.  . Alirocumab (PRALUENT) 150 MG/ML SOAJ Inject 150 mg into the skin every 14 (fourteen) days.  . ALPHAGAN P 0.1 % SOLN Place 1 drop into both eyes every 8 (eight) hours.   Marland Kitchen azelastine (ASTELIN) 0.1 % nasal spray PLACE 2 SPRAYS INTO EACH NOSTRIL TWO TIMES A DAY AS DIRECTED  . beclomethasone (QVAR REDIHALER) 80 MCG/ACT inhaler Inhale 2 puffs into the lungs 2 (two) times daily.  . Cholecalciferol (VITAMIN D) 2000 units tablet Take 2,000 Units by mouth daily.  . clonazePAM (KLONOPIN) 2 MG tablet 1 mg 2 (two) times daily.   Marland Kitchen doxycycline (VIBRA-TABS) 100 MG tablet Take 1 tablet (100 mg total) by mouth 2 (two) times daily.  . fenofibrate (TRICOR) 145 MG tablet TAKE ONE TABLET BY MOUTH DAILY  . fexofenadine (ALLEGRA) 180 MG tablet Take 180 mg by mouth daily.  . fluticasone (FLONASE) 50 MCG/ACT nasal spray PLACE 2 SPRAYS INTO BOTH NOSTRILS DAILY  . gabapentin (NEURONTIN) 600 MG tablet Take 1,200 mg by mouth 2 (two) times daily.   Marland Kitchen lamoTRIgine (LAMICTAL) 200 MG tablet Take 200 mg by mouth 2 (two) times daily.   Marland Kitchen LUMIGAN 0.01 % SOLN  Place 1 drop into both eyes at bedtime.   . methocarbamol (ROBAXIN) 500 MG tablet Take 1 tablet (500 mg total) by mouth every 8 (eight) hours as needed.  . metoprolol succinate (TOPROL-XL) 50 MG 24 hr tablet TAKE ONE TABLET BY MOUTH DAILY WITH A MEAL  . montelukast (SINGULAIR) 10 MG tablet Take 1 tablet (10 mg total) by mouth at bedtime.  . Multiple Vitamin (MULTIVITAMIN WITH MINERALS) TABS tablet Take 1 tablet by mouth daily.  . naproxen (NAPROSYN) 500 MG tablet Take 1 tablet (500 mg total) by mouth 2 (two) times daily.  Marland Kitchen OLANZapine (ZYPREXA) 10 MG tablet Take 10 mg by mouth daily as needed.  Marland Kitchen omega-3 acid ethyl esters (LOVAZA) 1 g capsule Take 2 capsules (2 g total) by mouth 2 (two) times  daily.  . ondansetron (ZOFRAN ODT) 4 MG disintegrating tablet Take 1 tablet (4 mg total) by mouth every 8 (eight) hours as needed for nausea or vomiting.  Marland Kitchen QUEtiapine (SEROQUEL XR) 400 MG 24 hr tablet Take one tablet every evening at bedtime  . QUEtiapine (SEROQUEL) 50 MG tablet As needed  . venlafaxine (EFFEXOR-XR) 75 MG 24 hr capsule Take 225 mg by mouth daily.    No facility-administered encounter medications on file as of 07/25/2020.   Patient Care Team    Relationship Specialty Notifications Start End  Midge Minium, MD PCP - General   10/22/10    Comment: Merged (Merged)  Midge Minium, MD    10/22/10    Comment: Elberta Spaniel, MD Consulting Physician Gynecology  07/28/17   Jettie Booze, MD Consulting Physician Cardiology  07/28/17   Leta Baptist, MD Consulting Physician Otolaryngology  07/28/17   Brayton Caves, MD Referring Physician Psychiatry  07/28/17   Janeth Rase, NP Nurse Practitioner Adult Health Nurse Practitioner  07/28/17   Darlyne Russian, LCSW Social Worker Licensed Clinical Social Worker  07/28/17   Star Age, MD Attending Physician Neurology  08/03/18   [name], [facility] Pharmacist Pharmacist  02/14/20    Comment: phone number [phone]   Current  Diagnosis/Assessment: Goals Addressed            This Visit's Progress   . Acid Reflux: Minimize recurring symptoms   On track    CARE PLAN ENTRY  Current Barriers:  . Chronic Disease Management support, education, and care coordination needs related to  GERD  Pharmacist Clinical Goal(s):  Marland Kitchen Over next 90 days: minimize recurring GERD symptoms.  Interventions: . Comprehensive medication review performed.  Patient Self Care Activities:  . Patient verbalizes understanding of plan to continue control with diet over the next 90 days.  Initial goal documentation.     . Asthma: Minimize recurring symptoms   On track    CARE PLAN ENTRY  Current Barriers:  . Chronic Disease Management support, education, and care coordination needs related to asthma.  Pharmacist Clinical Goal(s):  Marland Kitchen Over next 90 days prevent recurring asthma symptoms.   Interventions: . Comprehensive medication review performed.  Patient Self Care Activities:  . Patient verbalizes understanding of plan to call with any questions. Please let pharmacist know if you want to explore getting maintenance inhaler through patient assistance program over the next 90 days.   Initial goal documentation     . Biopolar Disorder: Minimize recurring symptoms   On track    CARE PLAN ENTRY  Current Barriers:  . Chronic Disease Management support, education, and care coordination needs related to bipolar disorder/anxiety.  o Current regimen: clonazepam 2 mg daily, Zyprexa 10 mg as needed, venlafaxine XR 225 mg daily as needed, quetiapine IR 50 mg as needed, quetiapine XR 400 mg every night.  Pharmacist Clinical Goal(s):  Marland Kitchen Over next 90 days: minimize recurring symptoms of bipolar disorder.  Interventions: . Comprehensive medication review performed.  Patient Self Care Activities:  . Patient verbalizes understanding of plan to continue current medications over the next 90 days unless directed otherwise.  Initial goal  documentation.    . Hyperlipidemia / exercise   On track    [place] (see longitudinal plan of care for additional care plan information)  Current Barriers:  . Current antihyperlipidemic regimen: Praluent 150 mg every 14 days, fenofibrate 145 mg daily, Lovaza 2 g daily . Previous antihyperlipidemic medications tried:  Repatha - insurance no longer approves . Most recent lipid panel:     Component Value Date/Time   CHOL 165 12/14/2019 0924   CHOL 163 11/14/2018 1447   CHOL 312 (H) 06/19/2015 0924   TRIG 153.0 (H) 12/14/2019 0924   TRIG 657 (H) 06/19/2015 0924   HDL 64.60 12/14/2019 0924   HDL 71 11/14/2018 1447   HDL 44 06/19/2015 0924   CHOLHDL 3 12/14/2019 0924   VLDL 30.6 12/14/2019 0924   LDLCALC 70 12/14/2019 0924   LDLCALC 66 11/14/2018 1447   LDLCALC Comment 06/19/2015 0924   LDLDIRECT 123.0 01/26/2019 1030   Pharmacist Clinical Goal(s):  Marland Kitchen Over the next 90 days, patient will work with PharmD and providers towards optimized antihyperlipidemic therapy o LDL Cholesterol <100   Interventions: . Comprehensive medication review performed; medication list updated in electronic medical record.  Bertram Savin care team collaboration (see longitudinal plan of care)  Patient Self Care Activities:  . Try your best to establish a walking routine with goal of 20-30 minutes 3-5 times a week as baseline. . Please call with any questions!  Initial goal documentation.     . Patient Assistance Program   On track    [place] (see longitudinal plan of care for additional care plan information)  Current Barriers:  . Financial Barriers: patient has [employer] and reports copay is cost prohibitive at this time. Requesting assistance with o Alphagan o Lumigan o Praluent  Pharmacist Clinical Goal(s):  Marland Kitchen Over the next 30 days, patient will work with PharmD and providers to relieve medication access concerns  Interventions: . Comprehensive medication review  completed; medication list updated in electronic medical record.  Bertram Savin care team collaboration (see longitudinal plan of care) . Reviewed application process. Patient will provide proof of income, out of pocket spend report, and will sign application. Will collaborate with corresponding prescribers for their portion of application. Once completed, will submit to patient assistance program.  Patient Self Care Activities:  . Patient will provide necessary portions of application   Initial goal documentation.       Hyperlipidemia   LDL goal < 100     Component Value Date/Time   CHOL 165 12/14/2019 0924   CHOL 163 11/14/2018 1447   CHOL 312 (H) 06/19/2015 0924   TRIG 153.0 (H) 12/14/2019 0924   TRIG 657 (H) 06/19/2015 0924   HDL 64.60 12/14/2019 0924   HDL 71 11/14/2018 1447   HDL 44 06/19/2015 0924   CHOLHDL 3 12/14/2019 0924   VLDL 30.6 12/14/2019 0924   LDLCALC 70 12/14/2019 0924   LDLCALC 66 11/14/2018 1447   LDLCALC Comment 06/19/2015 0924   LDLDIRECT 123.0 01/26/2019 1030   LABVLDL 26 11/14/2018 1447   The 10-year ASCVD risk score (Goff DC Jr., et al., 2013) is: 7.4%   Values used to calculate the score:     Age: 89 years     Sex: Female     Is Non-Hispanic African American: No     Diabetic: Yes     Tobacco smoker: No     Systolic Blood Pressure: 726 mmHg     Is BP treated: Yes     HDL Cholesterol: 64.6 mg/dL     Total Cholesterol: 165 mg/dL   Previous medications: Repatha (cost), statins (intolerance).  LDL at goal 12/2019 lipid panel. Patient is currently controlled on the following medications:   Praluent 150 mg every 14 days  Fenofibrate 145 mg daily  Lovaza 2 g  daily   We discussed: Exercise recommendations. Is walking 30 minutes once every day.   Plan  Continue current medications and control with diet and exercise.   Vaccines   Immunization History  Administered Date(s) Administered  . Influenza Split 09/02/2012  .  Influenza,inj,Quad PF,6+ Mos 10/09/2013, 08/13/2015, 08/17/2016, 07/28/2017, 08/03/2018, 08/08/2019, 07/20/2020  . Moderna SARS-COVID-2 Vaccination 02/27/2019, 01/27/2020  . Pneumococcal Polysaccharide-23 09/02/2012  . Tdap 07/15/2012  . Zoster Recombinat (Shingrix) 11/10/2018, 06/09/2019   Reviewed vaccines. Will be due for annual flu, has physical scheduled with PCP 08/09/2020. Will be due for covid booster 09/2020  Plan  No recommendations at this time.   Medication Management / Care coordination   Pt uses [facility] for all medications. Denies any issues with current medication management.   Encouraged completion of patient section on Alphagan, Lumigan, Praluent PAP applications.  Provided information on [employer] for patient to review plan options and see if able to qualify for extra help.  Reviewed [employer] grants for cholesterol medications, patient had assistance through 01/2020 however has not reapplied. We agreed to have grant renewed as available.  Reviewed and discussed upcoming appoints.  Plan  Continue current medication mangement.  [place] follow up: 2 month phone visit ____________________________ Future Appointments  Date Time Provider [place]  08/09/2020 10:00 AM Midge Minium, MD [facility]  08/14/2020  9:30 AM Collier Salina, MD [facility] None  09/04/2020  2:00 PM Collier Salina, MD [facility] None  09/12/2020 10:00 AM Megan Salon, MD [place] None  12/16/2020 10:15 AM [name], NP [facility] None   [name], Pharm.D., BCGP Clinical Pharmacist [id number] [phone]

## 2020-07-25 NOTE — Patient Instructions (Addendum)
Please review care plan below and call me at [phone] with any questions!  Thank you, Edyth Gunnels., Clinical Pharmacist  Goals Addressed            This Visit's Progress   . Acid Reflux: Minimize recurring symptoms   On track    CARE PLAN ENTRY  Current Barriers:  . Chronic Disease Management support, education, and care coordination needs related to  GERD  Pharmacist Clinical Goal(s):  Marland Kitchen Over next 90 days: minimize recurring GERD symptoms.  Interventions: . Comprehensive medication review performed.  Patient Self Care Activities:  . Patient verbalizes understanding of plan to continue control with diet over the next 90 days.  Initial goal documentation.     . Asthma: Minimize recurring symptoms   On track    CARE PLAN ENTRY  Current Barriers:  . Chronic Disease Management support, education, and care coordination needs related to asthma.  Pharmacist Clinical Goal(s):  Marland Kitchen Over next 90 days prevent recurring asthma symptoms.   Interventions: . Comprehensive medication review performed.  Patient Self Care Activities:  . Patient verbalizes understanding of plan to call with any questions. Please let pharmacist know if you want to explore getting maintenance inhaler through patient assistance program over the next 90 days.   Initial goal documentation     . Biopolar Disorder: Minimize recurring symptoms   On track    CARE PLAN ENTRY  Current Barriers:  . Chronic Disease Management support, education, and care coordination needs related to bipolar disorder/anxiety.  o Current regimen: clonazepam 2 mg daily, Zyprexa 10 mg as needed, venlafaxine XR 225 mg daily as needed, quetiapine IR 50 mg as needed, quetiapine XR 400 mg every night.  Pharmacist Clinical Goal(s):  Marland Kitchen Over next 90 days: minimize recurring symptoms of bipolar disorder.  Interventions: . Comprehensive medication review performed.  Patient Self Care Activities:  . Patient verbalizes understanding of  plan to continue current medications over the next 90 days unless directed otherwise.  Initial goal documentation.    . Hyperlipidemia / exercise   On track    Lorimor (see longitudinal plan of care for additional care plan information)  Current Barriers:  . Current antihyperlipidemic regimen: Praluent 150 mg every 14 days, fenofibrate 145 mg daily, Lovaza 2 g daily . Previous antihyperlipidemic medications tried: Repatha - insurance no longer approves . Most recent lipid panel:     Component Value Date/Time   CHOL 165 12/14/2019 0924   CHOL 163 11/14/2018 1447   CHOL 312 (H) 06/19/2015 0924   TRIG 153.0 (H) 12/14/2019 0924   TRIG 657 (H) 06/19/2015 0924   HDL 64.60 12/14/2019 0924   HDL 71 11/14/2018 1447   HDL 44 06/19/2015 0924   CHOLHDL 3 12/14/2019 0924   VLDL 30.6 12/14/2019 0924   LDLCALC 70 12/14/2019 0924   LDLCALC 66 11/14/2018 1447   LDLCALC Comment 06/19/2015 0924   LDLDIRECT 123.0 01/26/2019 1030   Pharmacist Clinical Goal(s):  Marland Kitchen Over the next 90 days, patient will work with PharmD and providers towards optimized antihyperlipidemic therapy o LDL Cholesterol <100   Interventions: . Comprehensive medication review performed; medication list updated in electronic medical record.  Bertram Savin care team collaboration (see longitudinal plan of care)  Patient Self Care Activities:  . Try your best to establish a walking routine with goal of 20-30 minutes 3-5 times a week as baseline. . Please call with any questions!  Initial goal documentation.     . Patient Assistance Program  On track    [place] (see longitudinal plan of care for additional care plan information)  Current Barriers:  . Financial Barriers: patient has [employer] and reports copay is cost prohibitive at this time. Requesting assistance with o Alphagan o [place] o Praluent  Pharmacist Clinical Goal(s):  Marland Kitchen Over the next 30 days, patient will work with PharmD  and providers to relieve medication access concerns  Interventions: . Comprehensive medication review completed; medication list updated in electronic medical record.  Bertram Savin care team collaboration (see longitudinal plan of care) . Reviewed application process. Patient will provide proof of income, out of pocket spend report, and will sign application. Will collaborate with corresponding prescribers for their portion of application. Once completed, will submit to patient assistance program.  Patient Self Care Activities:  . Patient will provide necessary portions of application   Initial goal documentation.       The patient verbalized understanding of instructions provided today and agreed to receive a mailed copy of patient instruction and/or educational materials. Telephone follow up appointment with pharmacy team member scheduled for: See next appointment with "Care Management Staff" under "What's Next" below.   [name], Pharm.D., BCGP Clinical Pharmacist [facility] [phone]  Diabetes Mellitus and Nutrition, Adult When you have diabetes (diabetes mellitus), it is very important to have healthy eating habits because your blood sugar (glucose) levels are greatly affected by what you eat and drink. Eating healthy foods in the appropriate amounts, at about the same times every day, can help you:  Control your blood glucose.  Lower your risk of heart disease.  Improve your blood pressure.  Reach or maintain a healthy weight. Every person with diabetes is different, and each person has different needs for a meal plan. Your health care provider may recommend that you work with a diet and nutrition specialist (dietitian) to make a meal plan that is best for you. Your meal plan may vary depending on factors such as:  The calories you need.  The medicines you take.  Your weight.  Your blood glucose, blood pressure, and cholesterol levels.  Your  activity level.  Other health conditions you have, such as heart or kidney disease. How do carbohydrates affect me? Carbohydrates, also called carbs, affect your blood glucose level more than any other type of food. Eating carbs naturally raises the amount of glucose in your blood. Carb counting is a method for keeping track of how many carbs you eat. Counting carbs is important to keep your blood glucose at a healthy level, especially if you use insulin or take certain oral diabetes medicines. It is important to know how many carbs you can safely have in each meal. This is different for every person. Your dietitian can help you calculate how many carbs you should have at each meal and for each snack. Foods that contain carbs include:  Bread, cereal, rice, pasta, and crackers.  Potatoes and corn.  Peas, beans, and lentils.  Milk and yogurt.  Fruit and juice.  Desserts, such as cakes, cookies, ice cream, and candy. How does alcohol affect me? Alcohol can cause a sudden decrease in blood glucose (hypoglycemia), especially if you use insulin or take certain oral diabetes medicines. Hypoglycemia can be a life-threatening condition. Symptoms of hypoglycemia (sleepiness, dizziness, and confusion) are similar to symptoms of having too much alcohol. If your health care provider says that alcohol is safe for you, follow these guidelines:  Limit alcohol intake to no more  than 1 drink per day for nonpregnant women and 2 drinks per day for men. One drink equals 12 oz of beer, 5 oz of wine, or 1 oz of hard liquor.  Do not drink on an empty stomach.  Keep yourself hydrated with water, diet soda, or unsweetened iced tea.  Keep in mind that regular soda, juice, and other mixers may contain a lot of sugar and must be counted as carbs. What are tips for following this plan?  Reading food labels  Start by checking the serving size on the "Nutrition Facts" label of packaged foods and drinks. The  amount of calories, carbs, fats, and other nutrients listed on the label is based on one serving of the item. Many items contain more than one serving per package.  Check the total grams (g) of carbs in one serving. You can calculate the number of servings of carbs in one serving by dividing the total carbs by 15. For example, if a food has 30 g of total carbs, it would be equal to 2 servings of carbs.  Check the number of grams (g) of saturated and trans fats in one serving. Choose foods that have low or no amount of these fats.  Check the number of milligrams (mg) of salt (sodium) in one serving. Most people should limit total sodium intake to less than 2,300 mg per day.  Always check the nutrition information of foods labeled as "low-fat" or "nonfat". These foods may be higher in added sugar or refined carbs and should be avoided.  Talk to your dietitian to identify your daily goals for nutrients listed on the label. Shopping  Avoid buying canned, premade, or processed foods. These foods tend to be high in fat, sodium, and added sugar.  Shop around the outside edge of the grocery store. This includes fresh fruits and vegetables, bulk grains, fresh meats, and fresh dairy. Cooking  Use low-heat cooking methods, such as baking, instead of high-heat cooking methods like deep frying.  Cook using healthy oils, such as olive, canola, or sunflower oil.  Avoid cooking with butter, cream, or high-fat meats. Meal planning  Eat meals and snacks regularly, preferably at the same times every day. Avoid going long periods of time without eating.  Eat foods high in fiber, such as fresh fruits, vegetables, beans, and whole grains. Talk to your dietitian about how many servings of carbs you can eat at each meal.  Eat 4-6 ounces (oz) of lean protein each day, such as lean meat, chicken, fish, eggs, or tofu. One oz of lean protein is equal to: ? 1 oz of meat, chicken, or fish. ? 1 egg. ?  cup of  tofu.  Eat some foods each day that contain healthy fats, such as avocado, nuts, seeds, and fish. Lifestyle  Check your blood glucose regularly.  Exercise regularly as told by your health care provider. This may include: ? 150 minutes of moderate-intensity or vigorous-intensity exercise each week. This could be brisk walking, biking, or water aerobics. ? Stretching and doing strength exercises, such as yoga or weightlifting, at least 2 times a week.  Take medicines as told by your health care provider.  Do not use any products that contain nicotine or tobacco, such as cigarettes and e-cigarettes. If you need help quitting, ask your health care provider.  Work with a Social worker or diabetes educator to identify strategies to manage stress and any emotional and social challenges. Questions to ask a health care provider  Do I  need to meet with a diabetes educator?  Do I need to meet with a dietitian?  What number can I call if I have questions?  When are the best times to check my blood glucose? Where to find more information:  American Diabetes Association: diabetes.org  Academy of Nutrition and Dietetics: www.eatright.[employer] of Diabetes and Digestive and Kidney Diseases (NIH): [employer] Summary  A healthy meal plan will help you control your blood glucose and maintain a healthy lifestyle.  Working with a diet and nutrition specialist (dietitian) can help you make a meal plan that is best for you.  Keep in mind that carbohydrates (carbs) and alcohol have immediate effects on your blood glucose levels. It is important to count carbs and to use alcohol carefully. This information is not intended to replace advice given to you by your health care provider. Make sure you discuss any questions you have with your health care provider. Document Revised: 10/08/2017 Document Reviewed: 11/30/2016 Elsevier Patient Education  2020 [employer].

## 2020-07-29 ENCOUNTER — Telehealth: Payer: Self-pay | Admitting: General Practice

## 2020-07-29 MED ORDER — FLOVENT HFA 110 MCG/ACT IN AERO: 2.0000 | INHALATION_SPRAY | Freq: Two times a day (BID) | RESPIRATORY_TRACT | 6 refills | Status: DC

## 2020-07-29 NOTE — Telephone Encounter (Signed)
Medication filled to pharmacy as requested.   

## 2020-07-29 NOTE — Addendum Note (Signed)
Addended by: Davis Gourd on: 07/29/2020 04:02 PM   Modules accepted: Orders

## 2020-07-29 NOTE — Telephone Encounter (Signed)
Ok for [employer] HFA 183mcg, 2 puffs twice daily, 1 inhaler w/ 6 refills

## 2020-07-29 NOTE — Telephone Encounter (Signed)
Received a fax from pharmacy that advised Qvar inhaler is not on pt Formulary. They instead prefer Flovent HFA, Arnuity, Symbicort, Flovent Diskus, Breo Ellipta, Pulmicort  Please advise?

## 2020-07-30 DIAGNOSIS — R69 Illness, unspecified: Secondary | ICD-10-CM | POA: Diagnosis not present

## 2020-08-02 DIAGNOSIS — E119 Type 2 diabetes mellitus without complications: Secondary | ICD-10-CM | POA: Diagnosis not present

## 2020-08-02 DIAGNOSIS — G4733 Obstructive sleep apnea (adult) (pediatric): Secondary | ICD-10-CM | POA: Diagnosis not present

## 2020-08-02 DIAGNOSIS — H401134 Primary open-angle glaucoma, bilateral, indeterminate stage: Secondary | ICD-10-CM | POA: Diagnosis not present

## 2020-08-02 DIAGNOSIS — H524 Presbyopia: Secondary | ICD-10-CM | POA: Diagnosis not present

## 2020-08-02 DIAGNOSIS — H33302 Unspecified retinal break, left eye: Secondary | ICD-10-CM | POA: Diagnosis not present

## 2020-08-02 LAB — HM DIABETES EYE EXAM

## 2020-08-06 MED ORDER — FLOVENT HFA 110 MCG/ACT IN AERO: 2.0000 | INHALATION_SPRAY | Freq: Two times a day (BID) | RESPIRATORY_TRACT | 6 refills | Status: DC

## 2020-08-06 NOTE — Addendum Note (Signed)
Addended by: Davis Gourd on: 08/06/2020 03:24 PM   Modules accepted: Orders

## 2020-08-06 NOTE — Telephone Encounter (Signed)
Patient is calling in stating that the [name] at [facility] does not have script for flovent HFA.  Is requesting for script to be sent again today.

## 2020-08-06 NOTE — Telephone Encounter (Signed)
This medication was filled again today.

## 2020-08-07 DIAGNOSIS — R69 Illness, unspecified: Secondary | ICD-10-CM | POA: Diagnosis not present

## 2020-08-09 ENCOUNTER — Other Ambulatory Visit: Payer: Self-pay

## 2020-08-09 ENCOUNTER — Encounter: Payer: Self-pay | Admitting: Family Medicine

## 2020-08-09 ENCOUNTER — Ambulatory Visit (INDEPENDENT_AMBULATORY_CARE_PROVIDER_SITE_OTHER): Payer: Medicare HMO | Admitting: Family Medicine

## 2020-08-09 VITALS — BP 123/80 | HR 79 | Temp 98.0°F | Resp 16 | Ht 69.0 in | Wt 225.2 lb

## 2020-08-09 DIAGNOSIS — E559 Vitamin D deficiency, unspecified: Secondary | ICD-10-CM

## 2020-08-09 DIAGNOSIS — Z0000 Encounter for general adult medical examination without abnormal findings: Secondary | ICD-10-CM | POA: Diagnosis not present

## 2020-08-09 DIAGNOSIS — Z1211 Encounter for screening for malignant neoplasm of colon: Secondary | ICD-10-CM

## 2020-08-09 DIAGNOSIS — E119 Type 2 diabetes mellitus without complications: Secondary | ICD-10-CM | POA: Diagnosis not present

## 2020-08-09 LAB — CBC WITH DIFFERENTIAL/PLATELET
Basophils Absolute: 0 10*3/uL (ref 0.0–0.1)
Basophils Relative: 0.3 % (ref 0.0–3.0)
Eosinophils Absolute: 0 10*3/uL (ref 0.0–0.7)
Eosinophils Relative: 0.1 % (ref 0.0–5.0)
HCT: 35.8 % — ABNORMAL LOW (ref 36.0–46.0)
Hemoglobin: 12.2 g/dL (ref 12.0–15.0)
Lymphocytes Relative: 31 % (ref 12.0–46.0)
Lymphs Abs: 1.3 10*3/uL (ref 0.7–4.0)
MCHC: 34.1 g/dL (ref 30.0–36.0)
MCV: 90.2 fl (ref 78.0–100.0)
Monocytes Absolute: 0.2 10*3/uL (ref 0.1–1.0)
Monocytes Relative: 6 % (ref 3.0–12.0)
Neutro Abs: 2.6 10*3/uL (ref 1.4–7.7)
Neutrophils Relative %: 62.6 % (ref 43.0–77.0)
Platelets: 241 10*3/uL (ref 150.0–400.0)
RBC: 3.97 Mil/uL (ref 3.87–5.11)
RDW: 13.6 % (ref 11.5–15.5)
WBC: 4.1 10*3/uL (ref 4.0–10.5)

## 2020-08-09 LAB — BASIC METABOLIC PANEL
BUN: 15 mg/dL (ref 6–23)
CO2: 26 mEq/L (ref 19–32)
Calcium: 9.3 mg/dL (ref 8.4–10.5)
Chloride: 104 mEq/L (ref 96–112)
Creatinine, Ser: 0.6 mg/dL (ref 0.40–1.20)
GFR: 100.97 mL/min (ref >60.00)
Glucose, Bld: 153 mg/dL — ABNORMAL HIGH (ref 70–99)
Potassium: 4.3 mEq/L (ref 3.5–5.1)
Sodium: 139 mEq/L (ref 135–145)

## 2020-08-09 LAB — LIPID PANEL
Cholesterol: 127 mg/dL (ref 0–200)
HDL: 70.2 mg/dL (ref >39.00)
LDL Cholesterol: 28 mg/dL (ref 0–99)
NonHDL: 56.76
Total CHOL/HDL Ratio: 2
Triglycerides: 146 mg/dL (ref 0.0–149.0)
VLDL: 29.2 mg/dL (ref 0.0–40.0)

## 2020-08-09 LAB — HEPATIC FUNCTION PANEL
ALT: 23 U/L (ref 0–35)
AST: 16 U/L (ref 0–37)
Albumin: 4.7 g/dL (ref 3.5–5.2)
Alkaline Phosphatase: 19 U/L — ABNORMAL LOW (ref 39–117)
Bilirubin, Direct: 0.1 mg/dL (ref 0.0–0.3)
Total Bilirubin: 0.5 mg/dL (ref 0.2–1.2)
Total Protein: 6.9 g/dL (ref 6.0–8.3)

## 2020-08-09 LAB — HEMOGLOBIN A1C: Hgb A1c MFr Bld: 7 % — ABNORMAL HIGH (ref 4.6–6.5)

## 2020-08-09 LAB — TSH: TSH: 1.32 u[IU]/mL (ref 0.35–4.50)

## 2020-08-09 LAB — VITAMIN D 25 HYDROXY (VIT D DEFICIENCY, FRACTURES): VITD: 26.77 ng/mL — ABNORMAL LOW (ref 30.00–100.00)

## 2020-08-09 NOTE — Assessment & Plan Note (Signed)
Pt's PE unchanged from previous and WNL w/ exception of obesity.  UTD on mammo, immunizations.  Due for colonoscopy- referral placed.  Check labs.  Anticipatory guidance provided.

## 2020-08-09 NOTE — Assessment & Plan Note (Signed)
Pt has hx of this.  Check labs and replete prn. 

## 2020-08-09 NOTE — Assessment & Plan Note (Signed)
Ongoing issue for pt.  Last A1C 6.9  UTD on eye exam, foot exam, and microalbumin.  Check labs.  Adjust tx plan prn.

## 2020-08-09 NOTE — Patient Instructions (Addendum)
Follow up in 3-4 months to recheck diabetes We'll notify you of your lab results and make any changes if needed Continue to work on healthy diet and regular exercise- you can do it! We'll call you with your GI appt to discuss colonoscopy Call with any questions or concerns Stay Safe!  Stay Healthy! Happy Early Rudene Anda!

## 2020-08-09 NOTE — Progress Notes (Signed)
Subjective:    Patient ID: [name], female    DOB: 1956/08/24, 64 y.o.   MRN: [id number]  HPI CPE- UTD on mammo.  No need for pap due to hysterectomy.  Due for colonoscopy.  UTD on pnuemovax, flu shot, Tdap.  UTD on COVID vaccines.  Reviewed past medical, surgical, family and social histories.   Patient Care Team    Relationship Specialty Notifications Start End  Midge Minium, MD PCP - General   10/22/10    Comment: Merged (Merged)  Midge Minium, MD    10/22/10    Comment: Elberta Spaniel, MD Consulting Physician Gynecology  07/28/17   Jettie Booze, MD Consulting Physician Cardiology  07/28/17   Leta Baptist, MD Consulting Physician Otolaryngology  07/28/17   Brayton Caves, MD Referring Physician Psychiatry  07/28/17   Janeth Rase, NP Nurse Practitioner Adult Health Nurse Practitioner  07/28/17   Darlyne Russian, Olivia Lopez de Gutierrez Social Worker Licensed Clinical Social Worker  07/28/17   Star Age, MD Attending Physician Neurology  08/03/18   [name], [facility] Pharmacist Pharmacist  02/14/20    Comment: phone number [phone]   Health Maintenance  Topic Date Due  . COLONOSCOPY  12/10/2016  . HIV Screening  08/09/2021 (Originally 08/13/1971)  . OPHTHALMOLOGY EXAM  10/12/2020  . HEMOGLOBIN A1C  11/09/2020  . FOOT EXAM  03/07/2021  . URINE MICROALBUMIN  03/07/2021  . MAMMOGRAM  04/10/2022  . TETANUS/TDAP  07/15/2022  . INFLUENZA VACCINE  Completed  . PNEUMOCOCCAL POLYSACCHARIDE VACCINE AGE 50-64 HIGH RISK  Completed  . COVID-19 Vaccine  Completed  . Hepatitis C Screening  Completed      Review of Systems Patient reports no vision/ hearing changes, adenopathy,fever, weight change,  persistant/recurrent hoarseness , swallowing issues, chest pain, palpitations, edema, persistant/recurrent cough, hemoptysis, dyspnea (rest/exertional/paroxysmal nocturnal), gastrointestinal bleeding (melena, rectal bleeding), abdominal pain, significant heartburn, bowel changes,  GU symptoms (dysuria, hematuria, incontinence), Gyn symptoms (abnormal  bleeding, pain),  syncope, focal weakness, memory loss, numbness & tingling, skin/hair/nail changes, abnormal bruising or bleeding, anxiety, or depression.   This visit occurred during the SARS-CoV-2 public health emergency.  Safety protocols were in place, including screening questions prior to the visit, additional usage of staff PPE, and extensive cleaning of exam room while observing appropriate contact time as indicated for disinfecting solutions.       Objective:   Physical Exam General Appearance:    Alert, cooperative, no distress, appears stated age, obese  Head:    Normocephalic, without obvious abnormality, atraumatic  Eyes:    PERRL, conjunctiva/corneas clear, EOM's intact, fundi    benign, both eyes  Ears:    Normal TM's and external ear canals, both ears  Nose:   Deferred due to COVID  Throat:   Neck:   Supple, symmetrical, trachea midline, no adenopathy;    Thyroid: no enlargement/tenderness/nodules  Back:     Symmetric, no curvature, ROM normal, no CVA tenderness  Lungs:     Clear to auscultation bilaterally, respirations unlabored  Chest Wall:    No tenderness or deformity   Heart:    Regular rate and rhythm, S1 and S2 normal, no murmur, rub   or gallop  Breast Exam:    Deferred to mammo  Abdomen:     Soft, non-tender, bowel sounds active all four quadrants,    no masses, no organomegaly  Genitalia:    Deferred  Rectal:    Extremities:   Extremities normal, atraumatic, no cyanosis  or edema  Pulses:   2+ and symmetric all extremities  Skin:   Skin color, texture, turgor normal, no rashes or lesions  Lymph nodes:   Cervical, supraclavicular, and axillary nodes normal  Neurologic:   CNII-XII intact, normal strength, sensation and reflexes    throughout          Assessment & Plan:

## 2020-08-12 ENCOUNTER — Other Ambulatory Visit: Payer: Self-pay | Admitting: General Practice

## 2020-08-12 MED ORDER — VITAMIN D (ERGOCALCIFEROL) 1.25 MG (50000 UNIT) PO CAPS: 50000.0000 [IU] | ORAL_CAPSULE | ORAL | 0 refills | Status: DC

## 2020-08-14 ENCOUNTER — Other Ambulatory Visit: Payer: Self-pay

## 2020-08-14 ENCOUNTER — Encounter: Payer: Self-pay | Admitting: Internal Medicine

## 2020-08-14 ENCOUNTER — Ambulatory Visit: Payer: Medicare HMO | Admitting: Internal Medicine

## 2020-08-14 VITALS — BP 127/84 | HR 72 | Ht 68.75 in | Wt 225.0 lb

## 2020-08-14 DIAGNOSIS — M6281 Muscle weakness (generalized): Secondary | ICD-10-CM

## 2020-08-14 DIAGNOSIS — E559 Vitamin D deficiency, unspecified: Secondary | ICD-10-CM

## 2020-08-14 DIAGNOSIS — R768 Other specified abnormal immunological findings in serum: Secondary | ICD-10-CM | POA: Insufficient documentation

## 2020-08-14 DIAGNOSIS — R682 Dry mouth, unspecified: Secondary | ICD-10-CM | POA: Diagnosis not present

## 2020-08-14 DIAGNOSIS — H04123 Dry eye syndrome of bilateral lacrimal glands: Secondary | ICD-10-CM | POA: Insufficient documentation

## 2020-08-14 DIAGNOSIS — R5383 Other fatigue: Secondary | ICD-10-CM | POA: Diagnosis not present

## 2020-08-14 NOTE — Patient Instructions (Addendum)
I am evaluating for antibodies associated with an autoimmune condition called Sjogren's Syndrome. This could be an underlying cause for pain, fatigue, and severe mouth and eye dryness but not certain at this time. We are also checking for evidence of muscle inflammation due to your experiencing weakness.   I would also like to see records from your eye doctor's office since these can be helpful in determining cause of symptoms.

## 2020-08-14 NOTE — Progress Notes (Addendum)
Office Visit Note  Patient: [name]             Date of Birth: [birth year]           MRN: [id number]             PCP: Midge Minium, MD Referring: Midge Minium, MD Visit Date: 08/14/2020 Occupation: Pet sitter/caretaker  Subjective:  Other (Positive ANA - patient complains of extreme fatigue, weakness, migraines, excessive sweating)   History of Present Illness: [name] is a 64 y.o. female here today for evaluation of positive ANA checked in evaluation of fatigue, headaches, excessive sweating and muscle weakness for the past approximately 3 months.  She was feeling in her usual health and does not recall any change in medication or major health event accompanying the symptoms.  She does report significant life stress during this time.  She does also experience what is likely a self-limited viral illness during this time that improved without a specific intervention.  She states that she is fatigued on a daily basis but did not notice any recent change to her sleep patterns.  Her muscle weakness feels "like her legs are jelly" but is able to complete walks.  She also feels some upper extremity weakness but less so than in her legs.  She denies any falls.  She has headaches chronically including migraines but recently complains of frontal headache more frequent than his usual.  She does describe bilateral dry eyes which are very chronic.  She has had cataract surgery with lens replacement and uses daily drops for glaucoma as well as preservative-free lubricating drops.  She states she is seeing Dr. Satira Sark with [facility].  She also describes very dry mouth and uses Biotene spray for symptomatic relief.  She denies any history of swelling in her cheeks or under the jaw.  Labs reviewed include the following: ANA 1:40 ESR 3 TSH 1.13  Activities of Daily Living:  Patient reports morning stiffness for 0 minutes.   Patient Denies nocturnal pain.    Difficulty dressing/grooming: Denies Difficulty climbing stairs: Denies Difficulty getting out of chair: Denies Difficulty using hands for taps, buttons, cutlery, and/or writing: Denies  Review of Systems  Constitutional: Positive for fatigue.  HENT: Positive for mouth dryness and nose dryness. Negative for mouth sores.   Eyes: Positive for dryness. Negative for pain, itching and visual disturbance.  Respiratory: Negative for cough, hemoptysis, shortness of breath and difficulty breathing.   Cardiovascular: Negative for chest pain, palpitations and swelling in legs/feet.  Gastrointestinal: Positive for constipation. Negative for abdominal pain, blood in stool and diarrhea.  Endocrine: Negative for increased urination.  Genitourinary: Negative for difficulty urinating and painful urination.  Musculoskeletal: Negative for arthralgias, joint pain, joint swelling, myalgias, muscle weakness, morning stiffness, muscle tenderness and myalgias.  Skin: Negative for color change, rash, hair loss, nodules/bumps and redness.  Allergic/Immunologic: Positive for susceptible to infections.  Neurological: Positive for dizziness, headaches and weakness. Negative for numbness and memory loss.  Hematological: Negative for swollen glands.  Psychiatric/Behavioral: Positive for depressed mood and sleep disturbance. Negative for confusion. The patient is nervous/anxious.     PMFS History:  Patient Active Problem List   Diagnosis Date Noted  . Positive ANA (antinuclear antibody) 08/14/2020  . Chronically dry eyes, bilateral 08/14/2020  . Dry mouth 08/14/2020  . Allergic rhinitis due to pollen 12/13/2018  . Diet-controlled diabetes mellitus ([place]) 02/09/2018  . Statin intolerance 11/04/2017  . Family history of early  CAD 03/10/2017  . HTN (hypertension) 02/15/2017  . Herniated nucleus pulposus, L5-S1 06/22/2014  . Asthma, moderate persistent 10/09/2013  . Fatigue 12/12/2012  . GERD (gastroesophageal  reflux disease) 09/02/2012  . Vitamin D deficiency 09/15/2010  . DEPRESSIVE DISORDER 07/17/2010  . RHINITIS 06/14/2009  . MUSCLE WEAKNESS (GENERALIZED) 01/03/2009  . Hyperlipidemia 10/10/2008  . BIPOLAR AFFECTIVE DISORDER 10/10/2008  . ADD 10/10/2008    Past Medical History:  Diagnosis Date  . Allergic rhinitis   . Anxiety   . Arthritis    R knee, Left Knee  . Asthma   . Bipolar disorder ([place])   . Cataract   . Complication of anesthesia    had bad anxiety after anesthesia  . Constipation   . Constipation, chronic   . Depression   . Diabetes mellitus without complication ([place])   . Dysrhythmia    Palpations occ, takes Tenormin  . GERD (gastroesophageal reflux disease)   . Heart murmur   . Hyperlipidemia   . Hypertension   . MVP (mitral valve prolapse)   . Urinary tract infection    frequent, see Urololgist 12/16/2010    Family History  Problem Relation Age of Onset  . Coronary artery disease Father   . Heart attack Father 40  . Heart disease Father   . Lung disease Mother        Mycobacterium avium intracellulare  . Hypertension Brother   . Hyperlipidemia Brother   . Anesthesia problems Neg Hx    Past Surgical History:  Procedure Laterality Date  . ABDOMINAL HYSTERECTOMY  2002   no oophorectomy  . BACK SURGERY  2001   L5 - S1  . CATARACT EXTRACTION    . NASAL SINUS SURGERY  01/2010   x2  . TONSILLECTOMY    . TOTAL KNEE ARTHROPLASTY  12/23/2011   Procedure: TOTAL KNEE ARTHROPLASTY;  Surgeon: Ninetta Lights, MD;  Location: [place];  Service: Orthopedics;  Laterality: Right;  DR MURPHY WANTS 120 MINUTES FOR SURGERY  . TOTAL KNEE ARTHROPLASTY  08/31/2012   left knee  . TOTAL KNEE ARTHROPLASTY  08/31/2012   Procedure: TOTAL KNEE ARTHROPLASTY;  Surgeon: Ninetta Lights, MD;  Location: [place];  Service: Orthopedics;  Laterality: Left;   Social History   Social History Narrative   Lives alone with 2 cats. Has pet sitting business.   2 cups of coffee a day, Rare soda     Immunization History  Administered Date(s) Administered  . Influenza Split 09/02/2012  . Influenza,inj,Quad PF,6+ Mos 10/09/2013, 08/13/2015, 08/17/2016, 07/28/2017, 08/03/2018, 08/08/2019, 07/20/2020  . Moderna SARS-COVID-2 Vaccination 02/27/2019, 01/27/2020  . Pneumococcal Polysaccharide-23 09/02/2012  . Tdap 07/15/2012  . Zoster Recombinat (Shingrix) 11/10/2018, 06/09/2019     Objective: Vital Signs: BP 127/84 (BP Location: Right Arm, Patient Position: Sitting, Cuff Size: Small)   Pulse 72   Ht 5' 8.75" (1.746 m)   Wt 225 lb (102.1 kg)   LMP 11/09/2000   BMI 33.47 kg/m    Physical Exam HENT:     Right Ear: External ear normal.     Left Ear: External ear normal.     Nose: Nose normal.     Mouth/Throat:     Comments: Extremely dry, tongue erythematous with deep fissures Eyes:     Conjunctiva/sclera: Conjunctivae normal.     Pupils: Pupils are equal, round, and reactive to light.  Cardiovascular:     Rate and Rhythm: Normal rate and regular rhythm.  Pulmonary:     Effort: Pulmonary effort  is normal.     Breath sounds: Normal breath sounds.  Skin:    Comments: Nails and fingertips with some peeling and keratotic changes patient attributes to biting/scratching, no pitting or onycholysis  Neurological:     Mental Status: She is alert.     Comments: Strength 5 out of 5 symmetrically in all extremities proximal and distal      Musculoskeletal Exam: Neck full range of motion no tenderness Shoulders, elbows, wrists, fingers full range of motion no tenderness or swelling Hips normal internal and external rotation without pain, mild lateral tenderness to palpation Knees full range of motion no tenderness or swelling, overlying well-healed surgical scars anteriorly bilaterally Ankles, MTP full range of motion no tenderness or swelling  CDAI Exam: CDAI Score: -- Patient Global: --; Provider Global: -- Swollen: --; Tender: -- Joint Exam 08/14/2020   No joint exam has  been documented for this visit   There is currently no information documented on the homunculus. Go to the Rheumatology activity and complete the homunculus joint exam.  Investigation: No additional findings.  Imaging: No results found.  Recent Labs: Lab Results  Component Value Date   WBC 4.1 08/09/2020   HGB 12.2 08/09/2020   PLT 241.0 08/09/2020   NA 139 08/09/2020   K 4.3 08/09/2020   CL 104 08/09/2020   CO2 26 08/09/2020   GLUCOSE 153 (H) 08/09/2020   BUN 15 08/09/2020   CREATININE 0.60 08/09/2020   BILITOT 0.5 08/09/2020   ALKPHOS 19 (L) 08/09/2020   AST 16 08/09/2020   ALT 23 08/09/2020   PROT 6.9 08/09/2020   ALBUMIN 4.7 08/09/2020   CALCIUM 9.3 08/09/2020   GFRAA >90 09/02/2012    Speciality Comments: No specialty comments available.  Procedures:  No procedures performed Allergies: Nitrofurantoin monohyd macro, Statins, Sulfa antibiotics, Alprazolam, Amoxicillin, and Prednisone   Assessment / Plan:     Visit Diagnoses: Positive ANA (antinuclear antibody) - Plan: Sjogrens syndrome-A extractable nuclear antibody, Sjogrens syndrome-B extractable nuclear antibody  Dry eyes, dry mouth, myalgia, fatigue could be symptoms consistent with Sjogren's syndrome but are nonspecific.  Lower extremity weakness is subjective I was unable to appreciate this on physical exam.  Muscle inflammation will be seen less commonly with this.  Symptoms are mostly not pain, and musculoskeletal symptoms localize well so does not really seem like a myofascial pain syndrome.  We will check SSA and SSB antibodies if positive would fit with the primary Sjogren's syndrome.  Fatigue, unspecified type  Increase daily fatigue without reported change in sleep pattern disturbance unclear if related to current increase in stress, medication related, or could be from inflammation or symptoms.  Discussed as relates to positive antibodies but no new intervention at this time.  Muscle weakness  (generalized) - Plan: CK, Aldolase  She reports muscle weakness most especially in the proximal legs but also elsewhere.  No objectively demonstrable weakness was noted on exam today.  We will check CK and aldolase for evidence of muscle inflammation.  Vitamin D deficiency  Mild vitamin D deficiency noted in primary care which can exacerbate symptoms of fatigue and musculoskeletal discomfort.  Has already started high-dose supplementation to address this.  Chronically dry eyes, bilateral  Chronic dry eyes predate the current symptoms by a lot and is also complicated as she has glaucoma and previous cataract surgery.  Plan to request outside records from [facility] ophthalmology as whether findings support a keratitis/dry eye syndrome.  Dry mouth Very dry mouth symptomatically and on exam  today although has fair dentition.  She is on some medications that could be contributory and also diabetes but would also consider relation to a sicca syndrome and a work-up today.  Use of nonsugar gums or lozenges and Biotene spray are appropriate first-line symptomatic management.  Orders: Orders Placed This Encounter  Procedures  . Sjogrens syndrome-A extractable nuclear antibody  . Sjogrens syndrome-B extractable nuclear antibody  . CK  . Aldolase   No orders of the defined types were placed in this encounter.    Follow-Up Instructions: Return in about 3 weeks (around 09/04/2020).   Collier Salina, MD  Note - This record has been created using [employer].  Chart creation errors have been sought, but may not always  have been located. Such creation errors do not reflect on  the standard of medical care.   Addendum  Reviewed [facility] ophthalmology notes patient on Lumigan and Alphagan drops with intraocular pressure within normal limits, Systane drops for dry eyes exam notes abnormal tear film and epithelium.

## 2020-08-15 ENCOUNTER — Telehealth: Payer: Self-pay | Admitting: Radiology

## 2020-08-15 LAB — CK: Total CK: 89 U/L (ref 29–143)

## 2020-08-15 LAB — SJOGRENS SYNDROME-B EXTRACTABLE NUCLEAR ANTIBODY: SSB (La) (ENA) Antibody, IgG: <1 AI

## 2020-08-15 LAB — SJOGRENS SYNDROME-A EXTRACTABLE NUCLEAR ANTIBODY: SSA (Ro) (ENA) Antibody, IgG: <1 AI

## 2020-08-15 LAB — ALDOLASE: Aldolase: 3.1 U/L (ref <=8.1)

## 2020-08-15 NOTE — Progress Notes (Deleted)
Subjective:   [name] is a 64 y.o. female who presents for Medicare Annual (Subsequent) preventive examination.   I connected with [name] today by telephone and verified that I am speaking with the correct person using two identifiers. Location patient: home Location provider: work Persons participating in the virtual visit: patient, Marine scientist.    I discussed the limitations, risks, security and privacy concerns of performing an evaluation and management service by telephone and the availability of in person appointments. I also discussed with the patient that there may be a patient responsible charge related to this service. The patient expressed understanding and verbally consented to this telephonic visit.    Interactive audio and video telecommunications were attempted between this provider and patient, however failed, due to patient having technical difficulties OR patient did not have access to video capability.  We continued and completed visit with audio only.  Some vital signs may be absent or patient reported.   Time Spent with patient on telephone encounter: *** minutes  Review of Systems    ***       Objective:    There were no vitals filed for this visit. There is no height or weight on file to calculate BMI.  Advanced Directives 08/23/2019 08/08/2019 08/03/2018 09/02/2017 07/28/2017 08/13/2015 11/30/2014  Does Patient Have a Medical Advance Directive? Yes Yes Yes Yes Yes Yes No;Yes  Type of Paramedic of [place];Living will [place];Living will [place];Living will - Living will;Healthcare Power of [place];Living will [place]  Does patient want to make changes to medical advance directive? - - - - - No - Patient declined No - Patient declined  Copy of Healthcare Power of Attorney in Chart? - No - copy requested No - copy requested - No - copy requested No -  copy requested No - copy requested  Pre-existing out of facility DNR order (yellow form or pink MOST form) - - - - - - -    Current Medications (verified) Outpatient Encounter Medications as of 08/19/2020  Medication Sig  . albuterol (VENTOLIN HFA) 108 (90 Base) MCG/ACT inhaler Inhale 2 puffs into the lungs every 6 (six) hours as needed for wheezing or shortness of breath. (Patient not taking: Reported on 08/14/2020)  . Alirocumab (PRALUENT) 150 MG/ML SOAJ Inject 150 mg into the skin every 14 (fourteen) days.  . ALPHAGAN P 0.1 % SOLN Place 1 drop into both eyes every 8 (eight) hours.   Marland Kitchen azelastine (ASTELIN) 0.1 % nasal spray PLACE 2 SPRAYS INTO EACH NOSTRIL TWO TIMES A DAY AS DIRECTED  . Cholecalciferol (VITAMIN D) 2000 units tablet Take 2,000 Units by mouth daily. (Patient not taking: Reported on 08/14/2020)  . clonazePAM (KLONOPIN) 2 MG tablet 1 mg 2 (two) times daily.   . Cyanocobalamin (VITAMIN B-12 PO) Take by mouth.  . doxycycline (VIBRA-TABS) 100 MG tablet Take 1 tablet (100 mg total) by mouth 2 (two) times daily. (Patient not taking: Reported on 08/14/2020)  . fenofibrate (TRICOR) 145 MG tablet TAKE ONE TABLET BY MOUTH DAILY  . fexofenadine (ALLEGRA) 180 MG tablet Take 180 mg by mouth daily.  . fluticasone (FLONASE) 50 MCG/ACT nasal spray PLACE 2 SPRAYS INTO BOTH NOSTRILS DAILY  . fluticasone (FLOVENT HFA) 110 MCG/ACT inhaler Inhale 2 puffs into the lungs in the morning and at bedtime.  . gabapentin (NEURONTIN) 600 MG tablet Take 1,200 mg by mouth 2 (two) times daily.   Marland Kitchen  lamoTRIgine (LAMICTAL) 200 MG tablet Take 200 mg by mouth 2 (two) times daily.   Marland Kitchen LUMIGAN 0.01 % SOLN Place 1 drop into both eyes at bedtime.   . methocarbamol (ROBAXIN) 500 MG tablet Take 1 tablet (500 mg total) by mouth every 8 (eight) hours as needed.  . metoprolol succinate (TOPROL-XL) 50 MG 24 hr tablet TAKE ONE TABLET BY MOUTH DAILY WITH A MEAL  . montelukast (SINGULAIR) 10 MG tablet Take 1 tablet (10 mg total)  by mouth at bedtime. (Patient not taking: Reported on 08/14/2020)  . Multiple Vitamin (MULTIVITAMIN WITH MINERALS) TABS tablet Take 1 tablet by mouth daily. (Patient not taking: Reported on 08/14/2020)  . naproxen (NAPROSYN) 500 MG tablet Take 1 tablet (500 mg total) by mouth 2 (two) times daily. (Patient taking differently: Take 500 mg by mouth as needed. )  . OLANZapine (ZYPREXA) 10 MG tablet Take 10 mg by mouth daily as needed.  Marland Kitchen omega-3 acid ethyl esters (LOVAZA) 1 g capsule Take 2 capsules (2 g total) by mouth 2 (two) times daily.  . ondansetron (ZOFRAN ODT) 4 MG disintegrating tablet Take 1 tablet (4 mg total) by mouth every 8 (eight) hours as needed for nausea or vomiting.  . Pyridoxine HCl (VITAMIN B-6 PO) Take by mouth.  . QUEtiapine (SEROQUEL XR) 400 MG 24 hr tablet Take one tablet every evening at bedtime  . QUEtiapine (SEROQUEL) 50 MG tablet As needed  . venlafaxine (EFFEXOR-XR) 75 MG 24 hr capsule Take 225 mg by mouth daily.   . Vitamin D, Ergocalciferol, (DRISDOL) 1.25 MG (50000 UNIT) CAPS capsule Take 1 capsule (50,000 Units total) by mouth every 7 (seven) days.   No facility-administered encounter medications on file as of 08/19/2020.    Allergies (verified) Nitrofurantoin monohyd macro, Statins, Sulfa antibiotics, Alprazolam, Amoxicillin, and Prednisone   History: Past Medical History:  Diagnosis Date  . Allergic rhinitis   . Anxiety   . Arthritis    R knee, Left Knee  . Asthma   . Bipolar disorder ([place])   . Cataract   . Complication of anesthesia    had bad anxiety after anesthesia  . Constipation   . Constipation, chronic   . Depression   . Diabetes mellitus without complication ([place])   . Dysrhythmia    Palpations occ, takes Tenormin  . GERD (gastroesophageal reflux disease)   . Heart murmur   . Hyperlipidemia   . Hypertension   . MVP (mitral valve prolapse)   . Urinary tract infection    frequent, see Urololgist 12/16/2010   Past Surgical History:    Procedure Laterality Date  . ABDOMINAL HYSTERECTOMY  2002   no oophorectomy  . BACK SURGERY  2001   L5 - S1  . CATARACT EXTRACTION    . NASAL SINUS SURGERY  01/2010   x2  . TONSILLECTOMY    . TOTAL KNEE ARTHROPLASTY  12/23/2011   Procedure: TOTAL KNEE ARTHROPLASTY;  Surgeon: Ninetta Lights, MD;  Location: [place];  Service: Orthopedics;  Laterality: Right;  DR MURPHY WANTS 120 MINUTES FOR SURGERY  . TOTAL KNEE ARTHROPLASTY  08/31/2012   left knee  . TOTAL KNEE ARTHROPLASTY  08/31/2012   Procedure: TOTAL KNEE ARTHROPLASTY;  Surgeon: Ninetta Lights, MD;  Location: [facility];  Service: Orthopedics;  Laterality: Left;   Family History  Problem Relation Age of Onset  . Coronary artery disease Father   . Heart attack Father 27  . Heart disease Father   . Lung disease Mother  Mycobacterium avium intracellulare  . Hypertension Brother   . Hyperlipidemia Brother   . Anesthesia problems Neg Hx    Social History   Socioeconomic History  . Marital status: Single    Spouse name: Not on file  . Number of children: 0  . Years of education: 44  . Highest education level: Not on file  Occupational History  . Occupation: retired//disability  Tobacco Use  . Smoking status: Never Smoker  . Smokeless tobacco: Never Used  Vaping Use  . Vaping Use: Never used  Substance and Sexual Activity  . Alcohol use: No    Alcohol/week: 0.0 standard drinks    Comment: Occasional glass of wine/"hardly ever"  . Drug use: No  . Sexual activity: Not Currently    Birth control/protection: Abstinence  Other Topics Concern  . Not on file  Social History Narrative   Lives alone with 2 cats. Has pet sitting business.   2 cups of coffee a day, Rare soda    Social Determinants of Health   Financial Resource Strain:   . Difficulty of Paying Living Expenses: Not on file  Food Insecurity: No Food Insecurity  . Worried About Charity fundraiser in the Last Year: Never true  . Ran Out of Food in the Last  Year: Never true  Transportation Needs: No Transportation Needs  . Lack of Transportation (Medical): No  . Lack of Transportation (Non-Medical): No  Physical Activity:   . Days of Exercise per Week: Not on file  . Minutes of Exercise per Session: Not on file  Stress:   . Feeling of Stress : Not on file  Social Connections:   . Frequency of Communication with Friends and Family: Not on file  . Frequency of Social Gatherings with Friends and Family: Not on file  . Attends Religious Services: Not on file  . Active Member of Clubs or Organizations: Not on file  . Attends Archivist Meetings: Not on file  . Marital Status: Not on file    Tobacco Counseling Counseling given: Not Answered   Clinical Intake:                Diabetes:  Is the patient diabetic?  Yes  If diabetic, was a CBG obtained today?  No  Did the patient bring in their glucometer from home?  No phone visit How often do you monitor your CBG's? ***.   Financial Strains and Diabetes Management:  Are you having any financial strains with the device, your supplies or your medication? {YES/NO:21197}.  Does the patient want to be seen by Chronic Care Management for management of their diabetes?  {YES/NO:21197} Would the patient like to be referred to a Nutritionist or for Diabetic Management?  {YES/NO:21197}  Diabetic Exams:  Diabetic Eye Exam: Completed 10/13/2019.   Diabetic Foot Exam: Completed 03/07/2020.           Activities of Daily Living In your present state of health, do you have any difficulty performing the following activities: 08/09/2020 03/07/2020  Hearing? N N  Vision? N N  Difficulty concentrating or making decisions? N N  Walking or climbing stairs? N N  Dressing or bathing? N N  Doing errands, shopping? N N  Some recent data might be hidden    Patient Care Team: Midge Minium, MD as PCP - General Midge Minium, MD Megan Salon, MD as Consulting  Physician (Gynecology) Jettie Booze, MD as Consulting Physician (Cardiology) Leta Baptist, MD as  Consulting Physician (Otolaryngology) Brayton Caves, MD as Referring Physician (Psychiatry) Janeth Rase, NP as Nurse Practitioner (Adult Health Nurse Practitioner) Darlyne Russian, LCSW as Social Worker (Licensed Clinical Social Worker) Star Age, MD as Attending Physician (Neurology) [name], [facility] as Pharmacist (Pharmacist)  Indicate any recent Medical Services you may have received from other than [facility] providers in the past year (date may be approximate).     Assessment:   This is a routine wellness examination for Tashanti.  Hearing/Vision screen No exam data present  Dietary issues and exercise activities discussed:    Goals      Patient Stated   .  Increase physical activity and eat healthy. (pt-stated)      Other   .  Acid Reflux: Minimize recurring symptoms      CARE PLAN ENTRY  Current Barriers:  . Chronic Disease Management support, education, and care coordination needs related to  GERD  Pharmacist Clinical Goal(s):  Marland Kitchen Over next 90 days: minimize recurring GERD symptoms.  Interventions: . Comprehensive medication review performed.  Patient Self Care Activities:  . Patient verbalizes understanding of plan to continue control with diet over the next 90 days.  Initial goal documentation.     .  Asthma: Minimize recurring symptoms      CARE PLAN ENTRY  Current Barriers:  . Chronic Disease Management support, education, and care coordination needs related to asthma.  Pharmacist Clinical Goal(s):  Marland Kitchen Over next 90 days prevent recurring asthma symptoms.   Interventions: . Comprehensive medication review performed.  Patient Self Care Activities:  . Patient verbalizes understanding of plan to call with any questions. Please let pharmacist know if you want to explore getting maintenance inhaler through patient assistance program over the next 90 days.     Initial goal documentation     .  Biopolar Disorder: Minimize recurring symptoms      CARE PLAN ENTRY  Current Barriers:  . Chronic Disease Management support, education, and care coordination needs related to bipolar disorder/anxiety.  o Current regimen: clonazepam 2 mg daily, Zyprexa 10 mg as needed, venlafaxine XR 225 mg daily as needed, quetiapine IR 50 mg as needed, quetiapine XR 400 mg every night.  Pharmacist Clinical Goal(s):  Marland Kitchen Over next 90 days: minimize recurring symptoms of bipolar disorder.  Interventions: . Comprehensive medication review performed.  Patient Self Care Activities:  . Patient verbalizes understanding of plan to continue current medications over the next 90 days unless directed otherwise.  Initial goal documentation.    .  Hyperlipidemia / exercise      CARE PLAN ENTRY (see longitudinal plan of care for additional care plan information)  Current Barriers:  . Current antihyperlipidemic regimen: Praluent 150 mg every 14 days, fenofibrate 145 mg daily, Lovaza 2 g daily . Previous antihyperlipidemic medications tried: Repatha - insurance no longer approves . Most recent lipid panel:     Component Value Date/Time   CHOL 165 12/14/2019 0924   CHOL 163 11/14/2018 1447   CHOL 312 (H) 06/19/2015 0924   TRIG 153.0 (H) 12/14/2019 0924   TRIG 657 (H) 06/19/2015 0924   HDL 64.60 12/14/2019 0924   HDL 71 11/14/2018 1447   HDL 44 06/19/2015 0924   CHOLHDL 3 12/14/2019 0924   VLDL 30.6 12/14/2019 0924   LDLCALC 70 12/14/2019 0924   LDLCALC 66 11/14/2018 1447   LDLCALC Comment 06/19/2015 0924   LDLDIRECT 123.0 01/26/2019 1030   Pharmacist Clinical Goal(s):  Marland Kitchen Over the next 90 days, patient will  work with PharmD and providers towards optimized antihyperlipidemic therapy o LDL Cholesterol <100   Interventions: . Comprehensive medication review performed; medication list updated in electronic medical record.  Bertram Savin care team collaboration  (see longitudinal plan of care)  Patient Self Care Activities:  . Try your best to establish a walking routine with goal of 20-30 minutes 3-5 times a week as baseline. . Please call with any questions!  Initial goal documentation.     .  LDL CALC < 130      LDL-P number goal < 1300 nmol/L    .  Patient Assistance Program      CARE PLAN ENTRY (see longitudinal plan of care for additional care plan information)  Current Barriers:  . Financial Barriers: patient has [employer] and reports copay is cost prohibitive at this time. Requesting assistance with o Alphagan o Lumigan o Praluent  Pharmacist Clinical Goal(s):  Marland Kitchen Over the next 30 days, patient will work with PharmD and providers to relieve medication access concerns  Interventions: . Comprehensive medication review completed; medication list updated in electronic medical record.  Bertram Savin care team collaboration (see longitudinal plan of care) . Reviewed application process. Patient will provide proof of income, out of pocket spend report, and will sign application. Will collaborate with corresponding prescribers for their portion of application. Once completed, will submit to patient assistance program.  Patient Self Care Activities:  . Patient will provide necessary portions of application   Initial goal documentation.       Depression Screen PHQ 2/9 Scores 08/09/2020 07/05/2020 05/29/2020 05/09/2020 03/07/2020 11/13/2019 03/23/2019  PHQ - 2 Score 6 0 1 0 0 4 0  PHQ- 9 Score 6 0 - - - 4 -    Fall Risk Fall Risk  08/09/2020 07/05/2020 05/09/2020 03/07/2020 11/13/2019  Falls in the past year? 0 0 0 0 1  Number falls in past yr: 0 0 0 0 0  Injury with Fall? 0 0 0 0 1  Risk for fall due to : - - - Impaired balance/gait -  Follow up Falls evaluation completed Falls evaluation completed Falls evaluation completed Falls evaluation completed Falls evaluation completed    Any stairs in or around the home?  {YES/NO:21197} If so, are there any without handrails? {YES/NO:21197} Home free of loose throw rugs in walkways, pet beds, electrical cords, etc? {YES/NO:21197} Adequate lighting in your home to reduce risk of falls? {YES/NO:21197}  ASSISTIVE DEVICES UTILIZED TO PREVENT FALLS:  Life alert? {YES/NO:21197} Use of a cane, walker or w/c? {YES/NO:21197} Grab bars in the bathroom? {YES/NO:21197} Shower chair or bench in shower? {YES/NO:21197} Elevated toilet seat or a handicapped toilet? {YES/NO:21197}  TIMED UP AND GO:  Was the test performed? {YES/NO:21197}.  Length of time to ambulate 10 feet: *** sec.   {Appearance of Gait:2101803}  Cognitive Function: MMSE - Mini Mental State Exam 08/08/2019 08/03/2018 08/13/2015  Orientation to time 5 5 5   Orientation to Place 5 5 5   Registration 3 3 3   Attention/ Calculation 5 5 5   Recall 3 2 1   Language- name 2 objects 2 2 2   Language- repeat 1 1 1   Language- follow 3 step command 3 3 3   Language- read & follow direction 1 1 1   Write a sentence 1 1 1   Copy design 1 1 1   Total score 30 29 28         Immunizations Immunization History  Administered Date(s) Administered  . Influenza Split 09/02/2012  . Influenza,inj,Quad PF,6+ Mos  10/09/2013, 08/13/2015, 08/17/2016, 07/28/2017, 08/03/2018, 08/08/2019, 07/20/2020  . Moderna SARS-COVID-2 Vaccination 02/27/2019, 01/27/2020  . Pneumococcal Polysaccharide-23 09/02/2012  . Tdap 07/15/2012  . Zoster Recombinat (Shingrix) 11/10/2018, 06/09/2019    TDAP status: Up to date   Flu Vaccine status: Up to date   Pneumococcal vaccine status: Up to date   Covid-19 vaccine status: Completed vaccines  Qualifies for Shingles Vaccine? No   Zostavax completed No   Shingrix Completed?: Yes  Screening Tests Health Maintenance  Topic Date Due  . COLONOSCOPY  12/10/2016  . HIV Screening  08/09/2021 (Originally 08/13/1971)  . OPHTHALMOLOGY EXAM  10/12/2020  . HEMOGLOBIN A1C  02/07/2021  . FOOT EXAM   03/07/2021  . URINE MICROALBUMIN  03/07/2021  . MAMMOGRAM  04/10/2022  . TETANUS/TDAP  07/15/2022  . INFLUENZA VACCINE  Completed  . PNEUMOCOCCAL POLYSACCHARIDE VACCINE AGE 45-64 HIGH RISK  Completed  . COVID-19 Vaccine  Completed  . Hepatitis C Screening  Completed    Health Maintenance  Health Maintenance Due  Topic Date Due  . COLONOSCOPY  12/10/2016    {Colorectal cancer screening:2101809}   Mammogram status: Completed 04/10/2020. Repeat every year   Bone Density status: Not yet indicated  Lung Cancer Screening: (Low Dose CT Chest recommended if Age 50-80 years, 30 pack-year currently smoking OR have quit w/in 15years.) does not qualify.     Additional Screening:  Hepatitis C Screening:Completed 10/22/2016  Vision Screening: Recommended annual ophthalmology exams for early detection of glaucoma and other disorders of the eye. Is the patient up to date with their annual eye exam?  {YES/NO:21197} Who is the provider or what is the name of the office in which the patient attends annual eye exams? *** If pt is not established with a provider, would they like to be referred to a provider to establish care? {YES/NO:21197}.   Dental Screening: Recommended annual dental exams for proper oral hygiene  Community Resource Referral / Chronic Care Management: CRR required this visit?  {YES/NO:21197}  CCM required this visit?  {YES/NO:21197}     Plan:     I have personally reviewed and noted the following in the patient's chart:   . Medical and social history . Use of alcohol, tobacco or illicit drugs  . Current medications and supplements . Functional ability and status . Nutritional status . Physical activity . Advanced directives . List of other physicians . Hospitalizations, surgeries, and ER visits in previous 12 months . Vitals . Screenings to include cognitive, depression, and falls . Referrals and appointments  In addition, I have reviewed and discussed with  patient certain preventive protocols, quality metrics, and best practice recommendations. A written personalized care plan for preventive services as well as general preventive health recommendations were provided to patient.    Due to this being a telephonic visit, the after visit summary with patients personalized plan was offered to patient via mail or my-chart. Patient would like to access on my-chart.   Marta Antu, LPN   29/07/3715  Nurse Health Advisor  Nurse Notes: ***

## 2020-08-15 NOTE — Telephone Encounter (Signed)
Most recent eye exam/notes obtained from [facility], sent to scan center.

## 2020-08-19 ENCOUNTER — Ambulatory Visit: Payer: Medicare HMO

## 2020-08-21 DIAGNOSIS — R69 Illness, unspecified: Secondary | ICD-10-CM | POA: Diagnosis not present

## 2020-08-21 DIAGNOSIS — L738 Other specified follicular disorders: Secondary | ICD-10-CM | POA: Diagnosis not present

## 2020-08-23 ENCOUNTER — Ambulatory Visit: Payer: Self-pay

## 2020-08-23 ENCOUNTER — Telehealth: Payer: Self-pay | Admitting: Family Medicine

## 2020-08-23 NOTE — Telephone Encounter (Signed)
I have reviewed the Rheumatology note and labs as well as the workup we did here and I don't see any obvious cause of her weakness.  Some of her psych medications could possibly cause this and it would be worthwhile to touch base with her psychiatrist to see if medications need to be adjusted.

## 2020-08-23 NOTE — Telephone Encounter (Signed)
Pt called in stating that she is having on going weakness that she and Dr.Tabori have talked about in the past. She wanted to know what she can do about this.   Please advise

## 2020-08-23 NOTE — Progress Notes (Signed)
  Chronic Care Management   Patient assistance applications   Name: [name] MRN: [id number] DOB: [id number]  Patient applications received from patient. All have been, reviewed completed, and faxed to prescribers. -Alphagan and Lumigan - faxed to Dr Satira Sark. Nelida Meuse - faxed to Dr Irish Lack.  [name], Pharm.D., BCGP Clinical Pharmacist [facility] [phone]

## 2020-08-23 NOTE — Telephone Encounter (Signed)
Patient notified of PCP recommendations and is agreement and expresses an understanding. Will call psych on Monday

## 2020-08-23 NOTE — Telephone Encounter (Signed)
Please advise 

## 2020-08-26 ENCOUNTER — Ambulatory Visit (INDEPENDENT_AMBULATORY_CARE_PROVIDER_SITE_OTHER): Payer: Medicare HMO

## 2020-08-26 VITALS — Temp 98.6°F | Ht 69.0 in | Wt 223.6 lb

## 2020-08-26 DIAGNOSIS — Z0000 Encounter for general adult medical examination without abnormal findings: Secondary | ICD-10-CM | POA: Diagnosis not present

## 2020-08-26 NOTE — Patient Instructions (Signed)
[name] , Thank you for taking time to come for your Medicare Wellness Visit. I appreciate your ongoing commitment to your health goals. Please review the following plan we discussed and let me know if I can assist you in the future.   Screening recommendations/referrals: Colonoscopy: Per our conversation, you will call to schedule. Please call the office if you need assistance scheduling Mammogram: Completed 04/10/2020- Due 04/10/2021 Bone Density: Not yet indicated Recommended yearly ophthalmology/optometry visit for glaucoma screening and checkup Recommended yearly dental visit for hygiene and checkup  Vaccinations: Influenza vaccine: Up to Date Pneumococcal vaccine: Up to Date Tdap vaccine: Up to Date-Due-07/15/2022 Shingles vaccine: Completed vaccines  Covid-19: Completed vaccines  Advanced directives: Copy in chart  Conditions/risks identified: See problem list  Next appointment: Follow up in one year for your annual wellness visit. 09/01/2021 @ 3:00pm  Preventive Care 40-64 Years, Female Preventive care refers to lifestyle choices and visits with your health care provider that can promote health and wellness. What does preventive care include?  A yearly physical exam. This is also called an annual well check.  Dental exams once or twice a year.  Routine eye exams. Ask your health care provider how often you should have your eyes checked.  Personal lifestyle choices, including:  Daily care of your teeth and gums.  Regular physical activity.  Eating a healthy diet.  Avoiding tobacco and drug use.  Limiting alcohol use.  Practicing safe sex.  Taking low-dose aspirin daily starting at age 85.  Taking vitamin and mineral supplements as recommended by your health care provider. What happens during an annual well check? The services and screenings done by your health care provider during your annual well check will depend on your age, overall health, lifestyle risk  factors, and family history of disease. Counseling  Your health care provider may ask you questions about your:  Alcohol use.  Tobacco use.  Drug use.  Emotional well-being.  Home and relationship well-being.  Sexual activity.  Eating habits.  Work and work Statistician.  Method of birth control.  Menstrual cycle.  Pregnancy history. Screening  You may have the following tests or measurements:  Height, weight, and BMI.  Blood pressure.  Lipid and cholesterol levels. These may be checked every 5 years, or more frequently if you are over 45 years old.  Skin check.  Lung cancer screening. You may have this screening every year starting at age 71 if you have a 30-pack-year history of smoking and currently smoke or have quit within the past 15 years.  Fecal occult blood test (FOBT) of the stool. You may have this test every year starting at age 11.  Flexible sigmoidoscopy or colonoscopy. You may have a sigmoidoscopy every 5 years or a colonoscopy every 10 years starting at age 69.  Hepatitis C blood test.  Hepatitis B blood test.  Sexually transmitted disease (STD) testing.  Diabetes screening. This is done by checking your blood sugar (glucose) after you have not eaten for a while (fasting). You may have this done every 1-3 years.  Mammogram. This may be done every 1-2 years. Talk to your health care provider about when you should start having regular mammograms. This may depend on whether you have a family history of breast cancer.  BRCA-related cancer screening. This may be done if you have a family history of breast, ovarian, tubal, or peritoneal cancers.  Pelvic exam and Pap test. This may be done every 3 years starting at age 62. Starting  at age 3, this may be done every 5 years if you have a Pap test in combination with an HPV test.  Bone density scan. This is done to screen for osteoporosis. You may have this scan if you are at high risk for  osteoporosis. Discuss your test results, treatment options, and if necessary, the need for more tests with your health care provider. Vaccines  Your health care provider may recommend certain vaccines, such as:  Influenza vaccine. This is recommended every year.  Tetanus, diphtheria, and acellular pertussis (Tdap, Td) vaccine. You may need a Td booster every 10 years.  Zoster vaccine. You may need this after age 30.  Pneumococcal 13-valent conjugate (PCV13) vaccine. You may need this if you have certain conditions and were not previously vaccinated.  Pneumococcal polysaccharide (PPSV23) vaccine. You may need one or two doses if you smoke cigarettes or if you have certain conditions. Talk to your health care provider about which screenings and vaccines you need and how often you need them. This information is not intended to replace advice given to you by your health care provider. Make sure you discuss any questions you have with your health care provider. Document Released: 11/22/2015 Document Revised: 07/15/2016 Document Reviewed: 08/27/2015 Elsevier Interactive Patient Education  2017 [place] Prevention in the Home Falls can cause injuries. They can happen to people of all ages. There are many things you can do to make your home safe and to help prevent falls. What can I do on the outside of my home?  Regularly fix the edges of walkways and driveways and fix any cracks.  Remove anything that might make you trip as you walk through a door, such as a raised step or threshold.  Trim any bushes or trees on the path to your home.  Use bright outdoor lighting.  Clear any walking paths of anything that might make someone trip, such as rocks or tools.  Regularly check to see if handrails are loose or broken. Make sure that both sides of any steps have handrails.  Any raised decks and porches should have guardrails on the edges.  Have any leaves, snow, or ice cleared  regularly.  Use sand or salt on walking paths during winter.  Clean up any spills in your garage right away. This includes oil or grease spills. What can I do in the bathroom?  Use night lights.  Install grab bars by the toilet and in the tub and shower. Do not use towel bars as grab bars.  Use non-skid mats or decals in the tub or shower.  If you need to sit down in the shower, use a plastic, non-slip stool.  Keep the floor dry. Clean up any water that spills on the floor as soon as it happens.  Remove soap buildup in the tub or shower regularly.  Attach bath mats securely with double-sided non-slip rug tape.  Do not have throw rugs and other things on the floor that can make you trip. What can I do in the bedroom?  Use night lights.  Make sure that you have a light by your bed that is easy to reach.  Do not use any sheets or blankets that are too big for your bed. They should not hang down onto the floor.  Have a firm chair that has side arms. You can use this for support while you get dressed.  Do not have throw rugs and other things on the floor  that can make you trip. What can I do in the kitchen?  Clean up any spills right away.  Avoid walking on wet floors.  Keep items that you use a lot in easy-to-reach places.  If you need to reach something above you, use a strong step stool that has a grab bar.  Keep electrical cords out of the way.  Do not use floor polish or wax that makes floors slippery. If you must use wax, use non-skid floor wax.  Do not have throw rugs and other things on the floor that can make you trip. What can I do with my stairs?  Do not leave any items on the stairs.  Make sure that there are handrails on both sides of the stairs and use them. Fix handrails that are broken or loose. Make sure that handrails are as long as the stairways.  Check any carpeting to make sure that it is firmly attached to the stairs. Fix any carpet that is loose  or worn.  Avoid having throw rugs at the top or bottom of the stairs. If you do have throw rugs, attach them to the floor with carpet tape.  Make sure that you have a light switch at the top of the stairs and the bottom of the stairs. If you do not have them, ask someone to add them for you. What else can I do to help prevent falls?  Wear shoes that:  Do not have high heels.  Have rubber bottoms.  Are comfortable and fit you well.  Are closed at the toe. Do not wear sandals.  If you use a stepladder:  Make sure that it is fully opened. Do not climb a closed stepladder.  Make sure that both sides of the stepladder are locked into place.  Ask someone to hold it for you, if possible.  Clearly mark and make sure that you can see:  Any grab bars or handrails.  First and last steps.  Where the edge of each step is.  Use tools that help you move around (mobility aids) if they are needed. These include:  Canes.  Walkers.  Scooters.  Crutches.  Turn on the lights when you go into a dark area. Replace any light bulbs as soon as they burn out.  Set up your furniture so you have a clear path. Avoid moving your furniture around.  If any of your floors are uneven, fix them.  If there are any pets around you, be aware of where they are.  Review your medicines with your doctor. Some medicines can make you feel dizzy. This can increase your chance of falling. Ask your doctor what other things that you can do to help prevent falls. This information is not intended to replace advice given to you by your health care provider. Make sure you discuss any questions you have with your health care provider. Document Released: 08/22/2009 Document Revised: 04/02/2016 Document Reviewed: 11/30/2014 Elsevier Interactive Patient Education  2017 [employer].

## 2020-08-26 NOTE — Progress Notes (Signed)
Subjective:   [name] is a 64 y.o. female who presents for Medicare Annual (Subsequent) preventive examination.   Review of Systems     Cardiac Risk Factors include: diabetes mellitus;obesity (BMI >30kg/m2)     Objective:    Today's Vitals   08/26/20 1530  Temp: 98.6 F (37 C)  TempSrc: Oral  Weight: 223 lb 9.6 oz (101.4 kg)  Height: 5\' 9"  (1.753 m)   Body mass index is 33.02 kg/m.  Advanced Directives 08/26/2020 08/23/2019 08/08/2019 08/03/2018 09/02/2017 07/28/2017 08/13/2015  Does Patient Have a Medical Advance Directive? Yes Yes Yes Yes Yes Yes Yes  Type of Paramedic of [place];Living will [place];Living will [place];Living will - Living will;Healthcare Power of [place];Living will  Does patient want to make changes to medical advance directive? - - - - - - No - Patient declined  Copy of [place] in Chart? Yes - validated most recent copy scanned in chart (See row information) - No - copy requested No - copy requested - No - copy requested No - copy requested  Pre-existing out of facility DNR order (yellow form or pink MOST form) - - - - - - -    Current Medications (verified) Outpatient Encounter Medications as of 08/26/2020  Medication Sig  . Alirocumab (PRALUENT) 150 MG/ML SOAJ Inject 150 mg into the skin every 14 (fourteen) days.  . ALPHAGAN P 0.1 % SOLN Place 1 drop into both eyes every 8 (eight) hours.   Marland Kitchen azelastine (ASTELIN) 0.1 % nasal spray PLACE 2 SPRAYS INTO EACH NOSTRIL TWO TIMES A DAY AS DIRECTED  . clonazePAM (KLONOPIN) 2 MG tablet 1 mg 2 (two) times daily.   . Cyanocobalamin (VITAMIN B-12 PO) Take by mouth.  . fenofibrate (TRICOR) 145 MG tablet TAKE ONE TABLET BY MOUTH DAILY  . fexofenadine (ALLEGRA) 180 MG tablet Take 180 mg by mouth daily.  . fluticasone (FLONASE) 50 MCG/ACT nasal spray PLACE 2 SPRAYS  INTO BOTH NOSTRILS DAILY  . fluticasone (FLOVENT HFA) 110 MCG/ACT inhaler Inhale 2 puffs into the lungs in the morning and at bedtime.  . gabapentin (NEURONTIN) 600 MG tablet Take 1,200 mg by mouth 2 (two) times daily.   Marland Kitchen lamoTRIgine (LAMICTAL) 200 MG tablet Take 200 mg by mouth 2 (two) times daily.   Marland Kitchen LUMIGAN 0.01 % SOLN Place 1 drop into both eyes at bedtime.   . methocarbamol (ROBAXIN) 500 MG tablet Take 1 tablet (500 mg total) by mouth every 8 (eight) hours as needed.  . metoprolol succinate (TOPROL-XL) 50 MG 24 hr tablet TAKE ONE TABLET BY MOUTH DAILY WITH A MEAL  . montelukast (SINGULAIR) 10 MG tablet Take 1 tablet (10 mg total) by mouth at bedtime.  . naproxen (NAPROSYN) 500 MG tablet Take 1 tablet (500 mg total) by mouth 2 (two) times daily. (Patient taking differently: Take 500 mg by mouth as needed. )  . OLANZapine (ZYPREXA) 10 MG tablet Take 10 mg by mouth daily as needed.  Marland Kitchen omega-3 acid ethyl esters (LOVAZA) 1 g capsule Take 2 capsules (2 g total) by mouth 2 (two) times daily.  . ondansetron (ZOFRAN ODT) 4 MG disintegrating tablet Take 1 tablet (4 mg total) by mouth every 8 (eight) hours as needed for nausea or vomiting.  . Pyridoxine HCl (VITAMIN B-6 PO) Take by mouth.  . QUEtiapine (SEROQUEL XR) 400 MG 24 hr tablet Take one tablet  every evening at bedtime  . QUEtiapine (SEROQUEL) 50 MG tablet As needed  . venlafaxine (EFFEXOR-XR) 75 MG 24 hr capsule Take 225 mg by mouth daily.   . Vitamin D, Ergocalciferol, (DRISDOL) 1.25 MG (50000 UNIT) CAPS capsule Take 1 capsule (50,000 Units total) by mouth every 7 (seven) days.  Marland Kitchen albuterol (VENTOLIN HFA) 108 (90 Base) MCG/ACT inhaler Inhale 2 puffs into the lungs every 6 (six) hours as needed for wheezing or shortness of breath. (Patient not taking: Reported on 08/14/2020)  . Multiple Vitamin (MULTIVITAMIN WITH MINERALS) TABS tablet Take 1 tablet by mouth daily. (Patient not taking: Reported on 08/26/2020)  . [DISCONTINUED] Cholecalciferol  (VITAMIN D) 2000 units tablet Take 2,000 Units by mouth daily. (Patient not taking: Reported on 08/26/2020)  . [DISCONTINUED] doxycycline (VIBRA-TABS) 100 MG tablet Take 1 tablet (100 mg total) by mouth 2 (two) times daily. (Patient not taking: Reported on 08/14/2020)   No facility-administered encounter medications on file as of 08/26/2020.    Allergies (verified) Nitrofurantoin monohyd macro, Statins, Sulfa antibiotics, Alprazolam, Amoxicillin, and Prednisone   History: Past Medical History:  Diagnosis Date  . Allergic rhinitis   . Anxiety   . Arthritis    R knee, Left Knee  . Asthma   . Bipolar disorder ([place])   . Cataract   . Complication of anesthesia    had bad anxiety after anesthesia  . Constipation   . Constipation, chronic   . Depression   . Diabetes mellitus without complication ([place])   . Dysrhythmia    Palpations occ, takes Tenormin  . GERD (gastroesophageal reflux disease)   . Heart murmur   . Hyperlipidemia   . Hypertension   . MVP (mitral valve prolapse)   . Urinary tract infection    frequent, see Urololgist 12/16/2010   Past Surgical History:  Procedure Laterality Date  . ABDOMINAL HYSTERECTOMY  2002   no oophorectomy  . BACK SURGERY  2001   L5 - S1  . CATARACT EXTRACTION    . NASAL SINUS SURGERY  01/2010   x2  . TONSILLECTOMY    . TOTAL KNEE ARTHROPLASTY  12/23/2011   Procedure: TOTAL KNEE ARTHROPLASTY;  Surgeon: Ninetta Lights, MD;  Location: [facility];  Service: Orthopedics;  Laterality: Right;  DR MURPHY WANTS 120 MINUTES FOR SURGERY  . TOTAL KNEE ARTHROPLASTY  08/31/2012   left knee  . TOTAL KNEE ARTHROPLASTY  08/31/2012   Procedure: TOTAL KNEE ARTHROPLASTY;  Surgeon: Ninetta Lights, MD;  Location: [facility];  Service: Orthopedics;  Laterality: Left;   Family History  Problem Relation Age of Onset  . Coronary artery disease Father   . Heart attack Father 86  . Heart disease Father   . Lung disease Mother        Mycobacterium avium intracellulare  .  Hypertension Brother   . Hyperlipidemia Brother   . Anesthesia problems Neg Hx    Social History   Socioeconomic History  . Marital status: Single    Spouse name: Not on file  . Number of children: 0  . Years of education: 1  . Highest education level: Not on file  Occupational History  . Occupation: retired//disability  Tobacco Use  . Smoking status: Never Smoker  . Smokeless tobacco: Never Used  Vaping Use  . Vaping Use: Never used  Substance and Sexual Activity  . Alcohol use: No    Alcohol/week: 0.0 standard drinks    Comment: Occasional glass of wine/"hardly ever"  . Drug use: No  .  Sexual activity: Not Currently    Birth control/protection: Abstinence  Other Topics Concern  . Not on file  Social History Narrative   Lives alone with 2 cats. Has pet sitting business.   2 cups of coffee a day, Rare soda    Social Determinants of Health   Financial Resource Strain: Low Risk   . Difficulty of Paying Living Expenses: Not very hard  Food Insecurity: No Food Insecurity  . Worried About Charity fundraiser in the Last Year: Never true  . Ran Out of Food in the Last Year: Never true  Transportation Needs: No Transportation Needs  . Lack of Transportation (Medical): No  . Lack of Transportation (Non-Medical): No  Physical Activity: Sufficiently Active  . Days of Exercise per Week: 7 days  . Minutes of Exercise per Session: 30 min  Stress: No Stress Concern Present  . Feeling of Stress : Only a little  Social Connections: Socially Isolated  . Frequency of Communication with Friends and Family: More than three times a week  . Frequency of Social Gatherings with Friends and Family: Once a week  . Attends Religious Services: Never  . Active Member of Clubs or Organizations: No  . Attends Archivist Meetings: Never  . Marital Status: Divorced    Tobacco Counseling Counseling given: Not Answered   Clinical Intake:  Pre-visit preparation completed:  Yes  Pain : No/denies pain     Nutritional Status: BMI > 30  Obese Nutritional Risks: None Diabetes: Yes CBG done?: No Did pt. bring in CBG monitor from home?: No  How often do you need to have someone help you when you read instructions, pamphlets, or other written materials from your doctor or pharmacy?: 1 - Never What is the last grade level you completed in school?: 3 yrs of college  Diabetes:  Is the patient diabetic?  Yes  If diabetic, was a CBG obtained today?  No  Did the patient bring in their glucometer from home?  No  How often do you monitor your CBG's? never.   Financial Strains and Diabetes Management:  Are you having any financial strains with the device, your supplies or your medication? No .  Does the patient want to be seen by Chronic Care Management for management of their diabetes?  No  Would the patient like to be referred to a Nutritionist or for Diabetic Management?  No   Diabetic Exams:  Diabetic Eye Exam: Completed 10/13/2019.   Diabetic Foot Exam: Completed 03/07/2020   Interpreter Needed?: No  Information entered by :: Caroleen Hamman LPN   Activities of Daily Living In your present state of health, do you have any difficulty performing the following activities: 08/26/2020 08/09/2020  Hearing? N N  Vision? N N  Difficulty concentrating or making decisions? N N  Walking or climbing stairs? N N  Dressing or bathing? N N  Doing errands, shopping? N N  Preparing Food and eating ? N -  Using the Toilet? N -  In the past six months, have you accidently leaked urine? N -  Do you have problems with loss of bowel control? N -  Managing your Medications? N -  Managing your Finances? N -  Housekeeping or managing your Housekeeping? N -  Some recent data might be hidden    Patient Care Team: Midge Minium, MD as PCP - General Midge Minium, MD Megan Salon, MD as Consulting Physician (Gynecology) Jettie Booze, MD as  Consulting Physician (Cardiology) Leta Baptist, MD as Consulting Physician (Otolaryngology) Brayton Caves, MD as Referring Physician (Psychiatry) Janeth Rase, NP as Nurse Practitioner (Adult Health Nurse Practitioner) Darlyne Russian, LCSW as Social Worker (Licensed Clinical Social Worker) Star Age, MD as Attending Physician (Neurology) [name], [facility] as Pharmacist (Pharmacist)  Indicate any recent Medical Services you may have received from other than [facility] providers in the past year (date may be approximate).     Assessment:   This is a routine wellness examination for Aria.  Hearing/Vision screen  Hearing Screening   125Hz  250Hz  500Hz  1000Hz  2000Hz  3000Hz  4000Hz  6000Hz  8000Hz   Right ear:           Left ear:           Vision Screening Comments: Cataracts removed Glaucoma Last eye exam 07/2020 Dr Satira Sark  Dietary issues and exercise activities discussed: Current Exercise Habits: Home exercise routine, Type of exercise: walking, Time (Minutes): 30, Frequency (Times/Week): 7, Weekly Exercise (Minutes/Week): 210, Intensity: Mild, Exercise limited by: None identified  Goals      Patient Stated   .  Increase physical activity and eat healthy. (pt-stated)      Other   .  Acid Reflux: Minimize recurring symptoms      CARE PLAN ENTRY  Current Barriers:  . Chronic Disease Management support, education, and care coordination needs related to  GERD  Pharmacist Clinical Goal(s):  Marland Kitchen Over next 90 days: minimize recurring GERD symptoms.  Interventions: . Comprehensive medication review performed.  Patient Self Care Activities:  . Patient verbalizes understanding of plan to continue control with diet over the next 90 days.  Initial goal documentation.     .  Asthma: Minimize recurring symptoms      CARE PLAN ENTRY  Current Barriers:  . Chronic Disease Management support, education, and care coordination needs related to asthma.  Pharmacist Clinical Goal(s):  Marland Kitchen Over  next 90 days prevent recurring asthma symptoms.   Interventions: . Comprehensive medication review performed.  Patient Self Care Activities:  . Patient verbalizes understanding of plan to call with any questions. Please let pharmacist know if you want to explore getting maintenance inhaler through patient assistance program over the next 90 days.   Initial goal documentation     .  Biopolar Disorder: Minimize recurring symptoms      CARE PLAN ENTRY  Current Barriers:  . Chronic Disease Management support, education, and care coordination needs related to bipolar disorder/anxiety.  o Current regimen: clonazepam 2 mg daily, Zyprexa 10 mg as needed, venlafaxine XR 225 mg daily as needed, quetiapine IR 50 mg as needed, quetiapine XR 400 mg every night.  Pharmacist Clinical Goal(s):  Marland Kitchen Over next 90 days: minimize recurring symptoms of bipolar disorder.  Interventions: . Comprehensive medication review performed.  Patient Self Care Activities:  . Patient verbalizes understanding of plan to continue current medications over the next 90 days unless directed otherwise.  Initial goal documentation.    .  Hyperlipidemia / exercise      CARE PLAN ENTRY (see longitudinal plan of care for additional care plan information)  Current Barriers:  . Current antihyperlipidemic regimen: Praluent 150 mg every 14 days, fenofibrate 145 mg daily, Lovaza 2 g daily . Previous antihyperlipidemic medications tried: Repatha - insurance no longer approves . Most recent lipid panel:     Component Value Date/Time   CHOL 165 12/14/2019 0924   CHOL 163 11/14/2018 1447   CHOL 312 (H) 06/19/2015 0924   TRIG 153.0 (  H) 12/14/2019 0924   TRIG 657 (H) 06/19/2015 0924   HDL 64.60 12/14/2019 0924   HDL 71 11/14/2018 1447   HDL 44 06/19/2015 0924   CHOLHDL 3 12/14/2019 0924   VLDL 30.6 12/14/2019 0924   LDLCALC 70 12/14/2019 0924   LDLCALC 66 11/14/2018 1447   LDLCALC Comment 06/19/2015 0924   LDLDIRECT 123.0  01/26/2019 1030   Pharmacist Clinical Goal(s):  Marland Kitchen Over the next 90 days, patient will work with PharmD and providers towards optimized antihyperlipidemic therapy o LDL Cholesterol <100   Interventions: . Comprehensive medication review performed; medication list updated in electronic medical record.  Bertram Savin care team collaboration (see longitudinal plan of care)  Patient Self Care Activities:  . Try your best to establish a walking routine with goal of 20-30 minutes 3-5 times a week as baseline. . Please call with any questions!  Initial goal documentation.     .  LDL CALC < 130      LDL-P number goal < 1300 nmol/L    .  Patient Assistance Program      CARE PLAN ENTRY (see longitudinal plan of care for additional care plan information)  Current Barriers:  . Financial Barriers: patient has [employer] and reports copay is cost prohibitive at this time. Requesting assistance with o [place] o [place] o Praluent  Pharmacist Clinical Goal(s):  Marland Kitchen Over the next 30 days, patient will work with PharmD and providers to relieve medication access concerns  Interventions: . Comprehensive medication review completed; medication list updated in electronic medical record.  Bertram Savin care team collaboration (see longitudinal plan of care) . Reviewed application process. Patient will provide proof of income, out of pocket spend report, and will sign application. Will collaborate with corresponding prescribers for their portion of application. Once completed, will submit to patient assistance program.  Patient Self Care Activities:  . Patient will provide necessary portions of application   Initial goal documentation.       Depression Screen PHQ 2/9 Scores 08/26/2020 08/09/2020 07/05/2020 05/29/2020 05/09/2020 03/07/2020 11/13/2019  PHQ - 2 Score 2 6 0 1 0 0 4  PHQ- 9 Score 6 6 0 - - - 4    Fall Risk Fall Risk  08/26/2020 08/09/2020 07/05/2020 05/09/2020 03/07/2020   Falls in the past year? 0 0 0 0 0  Number falls in past yr: 0 0 0 0 0  Injury with Fall? 0 0 0 0 0  Risk for fall due to : - - - - Impaired balance/gait  Follow up Falls prevention discussed Falls evaluation completed Falls evaluation completed Falls evaluation completed Falls evaluation completed    Any stairs in or around the home? Yes  If so, are there any without handrails? No  Home free of loose throw rugs in walkways, pet beds, electrical cords, etc? Yes  Adequate lighting in your home to reduce risk of falls? Yes   ASSISTIVE DEVICES UTILIZED TO PREVENT FALLS:  Life alert? No  Use of a cane, walker or w/c? No  Grab bars in the bathroom? Yes  Shower chair or bench in shower? No  Elevated toilet seat or a handicapped toilet? No   TIMED UP AND GO:  Was the test performed? Yes .  Length of time to ambulate 10 feet: 9 sec.   Gait steady and fast without use of assistive device  Cognitive Function:No cognitive impairment noted MMSE - Mini Mental State Exam 08/08/2019 08/03/2018 08/13/2015  Orientation to time 5 5 5  Orientation to Place 5 5 5   Registration 3 3 3   Attention/ Calculation 5 5 5   Recall 3 2 1   Language- name 2 objects 2 2 2   Language- repeat 1 1 1   Language- follow 3 step command 3 3 3   Language- read & follow direction 1 1 1   Write a sentence 1 1 1   Copy design 1 1 1   Total score 30 29 28         Immunizations Immunization History  Administered Date(s) Administered  . Influenza Split 09/02/2012  . Influenza,inj,Quad PF,6+ Mos 10/09/2013, 08/13/2015, 08/17/2016, 07/28/2017, 08/03/2018, 08/08/2019, 07/20/2020  . Moderna SARS-COVID-2 Vaccination 02/27/2019, 01/27/2020  . Pneumococcal Polysaccharide-23 09/02/2012  . Tdap 07/15/2012  . Zoster Recombinat (Shingrix) 11/10/2018, 06/09/2019    TDAP status: Up to date Flu Vaccine status: Up to date Pneumococcal vaccine status: Up to date Covid-19 vaccine status: Completed vaccines  Qualifies for Shingles  Vaccine? No   Zostavax completed No   Shingrix Completed?: Yes  Screening Tests Health Maintenance  Topic Date Due  . COLONOSCOPY  12/10/2016  . HIV Screening  08/09/2021 (Originally 08/13/1971)  . HEMOGLOBIN A1C  02/07/2021  . FOOT EXAM  03/07/2021  . URINE MICROALBUMIN  03/07/2021  . OPHTHALMOLOGY EXAM  08/02/2021  . MAMMOGRAM  04/10/2022  . TETANUS/TDAP  07/15/2022  . INFLUENZA VACCINE  Completed  . PNEUMOCOCCAL POLYSACCHARIDE VACCINE AGE 24-64 HIGH RISK  Completed  . COVID-19 Vaccine  Completed  . Hepatitis C Screening  Completed    Health Maintenance  Health Maintenance Due  Topic Date Due  . COLONOSCOPY  12/10/2016    Colorectal Cancer Screening: Referral placed 08/09/20. Patient states she needs to call the GI office back to schedule.  Mammogram status: Completed 04/10/2020. Repeat every year   Bone Density Status: Not yet indicated  Lung Cancer Screening: (Low Dose CT Chest recommended if Age 42-80 years, 30 pack-year currently smoking OR have quit w/in 15years.) does not qualify.     Additional Screening:  Hepatitis C Screening:Completed 10/22/2016  Vision Screening: Recommended annual ophthalmology exams for early detection of glaucoma and other disorders of the eye. Is the patient up to date with their annual eye exam?  Yes  Who is the provider or what is the name of the office in which the patient attends annual eye exams? Dr Satira Sark I   Dental Screening: Recommended annual dental exams for proper oral hygiene  Community Resource Referral / Chronic Care Management: CRR required this visit?  No   CCM required this visit?  No      Plan:     I have personally reviewed and noted the following in the patient's chart:   . Medical and social history . Use of alcohol, tobacco or illicit drugs  . Current medications and supplements . Functional ability and status . Nutritional status . Physical activity . Advanced directives . List of other  physicians . Hospitalizations, surgeries, and ER visits in previous 12 months . Vitals . Screenings to include cognitive, depression, and falls . Referrals and appointments  In addition, I have reviewed and discussed with patient certain preventive protocols, quality metrics, and best practice recommendations. A written personalized care plan for preventive services as well as general preventive health recommendations were provided to patient.  Patient to access AVS via mychart     Marta Antu, LPN   [id number]  Nurse Health Advisor  Nurse Notes: None

## 2020-08-29 ENCOUNTER — Telehealth: Payer: Self-pay | Admitting: Pharmacist

## 2020-08-29 DIAGNOSIS — G4733 Obstructive sleep apnea (adult) (pediatric): Secondary | ICD-10-CM | POA: Diagnosis not present

## 2020-08-29 NOTE — Telephone Encounter (Addendum)
Received fax from Edison Nasuti at [employer] at [place] patient assistance application- filled out just needed Dr. Irish Lack signature. Had Dr. Irish Lack sign and faxed back to [facility]. Made patient aware and she was very appreciative.

## 2020-09-01 ENCOUNTER — Other Ambulatory Visit: Payer: Self-pay | Admitting: Family Medicine

## 2020-09-01 DIAGNOSIS — G4733 Obstructive sleep apnea (adult) (pediatric): Secondary | ICD-10-CM | POA: Diagnosis not present

## 2020-09-03 DIAGNOSIS — M9901 Segmental and somatic dysfunction of cervical region: Secondary | ICD-10-CM | POA: Diagnosis not present

## 2020-09-03 DIAGNOSIS — M5416 Radiculopathy, lumbar region: Secondary | ICD-10-CM | POA: Diagnosis not present

## 2020-09-03 DIAGNOSIS — M530 Cervicocranial syndrome: Secondary | ICD-10-CM | POA: Diagnosis not present

## 2020-09-03 DIAGNOSIS — M9903 Segmental and somatic dysfunction of lumbar region: Secondary | ICD-10-CM | POA: Diagnosis not present

## 2020-09-04 ENCOUNTER — Encounter: Payer: Self-pay | Admitting: Internal Medicine

## 2020-09-04 ENCOUNTER — Ambulatory Visit: Payer: Medicare HMO | Admitting: Internal Medicine

## 2020-09-04 ENCOUNTER — Other Ambulatory Visit: Payer: Self-pay

## 2020-09-04 VITALS — BP 122/77 | HR 68 | Ht 69.0 in | Wt 223.0 lb

## 2020-09-04 DIAGNOSIS — M6281 Muscle weakness (generalized): Secondary | ICD-10-CM | POA: Diagnosis not present

## 2020-09-04 DIAGNOSIS — M5416 Radiculopathy, lumbar region: Secondary | ICD-10-CM | POA: Diagnosis not present

## 2020-09-04 DIAGNOSIS — M530 Cervicocranial syndrome: Secondary | ICD-10-CM | POA: Diagnosis not present

## 2020-09-04 DIAGNOSIS — E559 Vitamin D deficiency, unspecified: Secondary | ICD-10-CM

## 2020-09-04 DIAGNOSIS — R768 Other specified abnormal immunological findings in serum: Secondary | ICD-10-CM | POA: Diagnosis not present

## 2020-09-04 DIAGNOSIS — M9903 Segmental and somatic dysfunction of lumbar region: Secondary | ICD-10-CM | POA: Diagnosis not present

## 2020-09-04 DIAGNOSIS — H04123 Dry eye syndrome of bilateral lacrimal glands: Secondary | ICD-10-CM

## 2020-09-04 DIAGNOSIS — M9901 Segmental and somatic dysfunction of cervical region: Secondary | ICD-10-CM | POA: Diagnosis not present

## 2020-09-04 NOTE — Progress Notes (Signed)
Office Visit Note  Patient: [name]             Date of Birth: 1955-11-10           MRN: [id number]             PCP: Midge Minium, MD Referring: Midge Minium, MD Visit Date: 09/04/2020  Subjective:   History of Present Illness: [name] is a 64 y.o. female here for follow up with arthralgias and dry eyes and mouth. After last visit she continues to have the same level of dry eyes and mouth. She continues to have fatigue and complains of stiffness and relative weakness in her legs such as when climbing stairs. No rashes, no swelling, or other major changes during the interval.  Activities of Daily Living:  Patient reports morning stiffness for 2 hours.   Patient Denies nocturnal pain.  Difficulty dressing/grooming: Denies Difficulty climbing stairs: Denies Difficulty getting out of chair: Denies Difficulty using hands for taps, buttons, cutlery, and/or writing: Denies  Review of Systems  Constitutional: Positive for fatigue.  HENT: Positive for mouth dryness and nose dryness. Negative for mouth sores.   Eyes: Positive for dryness. Negative for pain, itching and visual disturbance.  Respiratory: Negative for cough, hemoptysis, shortness of breath and difficulty breathing.   Cardiovascular: Negative for chest pain, palpitations and swelling in legs/feet.  Gastrointestinal: Negative for abdominal pain, blood in stool, constipation and diarrhea.  Endocrine: Negative for increased urination.  Genitourinary: Negative for painful urination.  Musculoskeletal: Positive for arthralgias, joint pain, myalgias, muscle weakness, morning stiffness and myalgias. Negative for joint swelling and muscle tenderness.  Skin: Negative for color change, rash and redness.  Allergic/Immunologic: Negative for susceptible to infections.  Neurological: Positive for headaches. Negative for dizziness, numbness, memory loss and weakness.  Hematological: Negative for swollen glands.    Psychiatric/Behavioral: Negative for confusion and sleep disturbance.    PMFS History:  Patient Active Problem List   Diagnosis Date Noted  . Positive ANA (antinuclear antibody) 08/14/2020  . Chronically dry eyes, bilateral 08/14/2020  . Dry mouth 08/14/2020  . Allergic rhinitis due to pollen 12/13/2018  . Diet-controlled diabetes mellitus ([place]) 02/09/2018  . Statin intolerance 11/04/2017  . Family history of early CAD 03/10/2017  . HTN (hypertension) 02/15/2017  . Herniated nucleus pulposus, L5-S1 06/22/2014  . Asthma, moderate persistent 10/09/2013  . Fatigue 12/12/2012  . GERD (gastroesophageal reflux disease) 09/02/2012  . Vitamin D deficiency 09/15/2010  . DEPRESSIVE DISORDER 07/17/2010  . RHINITIS 06/14/2009  . MUSCLE WEAKNESS (GENERALIZED) 01/03/2009  . Hyperlipidemia 10/10/2008  . BIPOLAR AFFECTIVE DISORDER 10/10/2008  . ADD 10/10/2008    Past Medical History:  Diagnosis Date  . Allergic rhinitis   . Anxiety   . Arthritis    R knee, Left Knee  . Asthma   . Bipolar disorder ([place])   . Cataract   . Complication of anesthesia    had bad anxiety after anesthesia  . Constipation   . Constipation, chronic   . Depression   . Diabetes mellitus without complication ([place])   . Dysrhythmia    Palpations occ, takes Tenormin  . GERD (gastroesophageal reflux disease)   . Heart murmur   . Hyperlipidemia   . Hypertension   . MVP (mitral valve prolapse)   . Urinary tract infection    frequent, see Urololgist 12/16/2010    Family History  Problem Relation Age of Onset  . Coronary artery disease Father   . Heart attack  Father 38  . Heart disease Father   . Lung disease Mother        Mycobacterium avium intracellulare  . Hypertension Brother   . Hyperlipidemia Brother   . Anesthesia problems Neg Hx    Past Surgical History:  Procedure Laterality Date  . ABDOMINAL HYSTERECTOMY  2002   no oophorectomy  . BACK SURGERY  2001   L5 - S1  . CATARACT EXTRACTION    .  NASAL SINUS SURGERY  01/2010   x2  . TONSILLECTOMY    . TOTAL KNEE ARTHROPLASTY  12/23/2011   Procedure: TOTAL KNEE ARTHROPLASTY;  Surgeon: Ninetta Lights, MD;  Location: [facility];  Service: Orthopedics;  Laterality: Right;  DR MURPHY WANTS 120 MINUTES FOR SURGERY  . TOTAL KNEE ARTHROPLASTY  08/31/2012   left knee  . TOTAL KNEE ARTHROPLASTY  08/31/2012   Procedure: TOTAL KNEE ARTHROPLASTY;  Surgeon: Ninetta Lights, MD;  Location: [place];  Service: Orthopedics;  Laterality: Left;   Social History   Social History Narrative   Lives alone with 2 cats. Has pet sitting business.   2 cups of coffee a day, Rare soda    Immunization History  Administered Date(s) Administered  . Influenza Split 09/02/2012  . Influenza,inj,Quad PF,6+ Mos 10/09/2013, 08/13/2015, 08/17/2016, 07/28/2017, 08/03/2018, 08/08/2019, 07/20/2020  . Moderna SARS-COVID-2 Vaccination 02/27/2019, 01/27/2020  . Pneumococcal Polysaccharide-23 09/02/2012  . Tdap 07/15/2012  . Zoster Recombinat (Shingrix) 11/10/2018, 06/09/2019     Objective: Vital Signs: BP 122/77 (BP Location: Left Arm, Patient Position: Sitting, Cuff Size: Small)   Pulse 68   Ht 5\' 9"  (1.753 m)   Wt 223 lb (101.2 kg)   LMP 11/09/2000   BMI 32.93 kg/m    Physical Exam HENT:     Right Ear: External ear normal.     Left Ear: External ear normal.     Mouth/Throat:     Mouth: Mucous membranes are moist.     Pharynx: Oropharynx is clear.  Eyes:     Conjunctiva/sclera: Conjunctivae normal.  Cardiovascular:     Rate and Rhythm: Normal rate and regular rhythm.  Pulmonary:     Effort: Pulmonary effort is normal.     Breath sounds: Normal breath sounds.  Skin:    General: Skin is warm and dry.     Findings: No rash.  Neurological:     Mental Status: She is alert.  Psychiatric:        Mood and Affect: Mood normal.      Musculoskeletal Exam:  Neck full range of motion no tenderness Shoulders, elbows, wrists, fingers full range of motion no  tenderness or swelling Hips normal internal and external rotation without pain, mild lateral tenderness to palpation Knees full range of motion no tenderness or swelling, overlying well-healed surgical scars anteriorly bilaterally Ankles, MTP full range of motion no tenderness or swelling Supine straight leg raise weak relative to upper body strength and leg abduction strength. No pain and full ROM.  CDAI Exam: CDAI Score: -- Patient Global: --; Provider Global: -- Swollen: --; Tender: -- Joint Exam 09/04/2020   No joint exam has been documented for this visit   There is currently no information documented on the homunculus. Go to the Rheumatology activity and complete the homunculus joint exam.  Investigation: No additional findings.  Imaging: No results found.  Recent Labs: Lab Results  Component Value Date   WBC 4.1 08/09/2020   HGB 12.2 08/09/2020   PLT 241.0 08/09/2020   NA  139 08/09/2020   K 4.3 08/09/2020   CL 104 08/09/2020   CO2 26 08/09/2020   GLUCOSE 153 (H) 08/09/2020   BUN 15 08/09/2020   CREATININE 0.60 08/09/2020   BILITOT 0.5 08/09/2020   ALKPHOS 19 (L) 08/09/2020   AST 16 08/09/2020   ALT 23 08/09/2020   PROT 6.9 08/09/2020   ALBUMIN 4.7 08/09/2020   CALCIUM 9.3 08/09/2020   GFRAA >90 09/02/2012    Speciality Comments: No specialty comments available.  Procedures:  No procedures performed Allergies: Nitrofurantoin monohyd macro, Statins, Sulfa antibiotics, Alprazolam, Amoxicillin, and Prednisone   Assessment / Plan:     Visit Diagnoses: Positive ANA (antinuclear antibody)  ENA tests associated with sjogren's syndrome were negative which would have been the potential condition associated with her symptoms. I do not suspect systemic lupus or other autoimmune condition at this time. Reassured that symptom directed management will be appropriate does not need immunosuppression. No new treatment or f/u recommended at this time unless new change  develop.  Vitamin D deficiency  Already replacing vitamin D under recommendation of PCP office, reporting perceived improvement in fatigue with this change.  Chronically dry eyes, bilateral  Recommended continue f/u with ophthalmology no new treatment from our standpoint  Muscle weakness (generalized) - Plan: Ambulatory referral to Physical Therapy  She has somewhat poor strength and muscle tone in quadriceps I suspect mostly deconditioning no neurologic type of changes appreciated or described by her. Referral to PT for assessment and will probably benefit with strengthening exercises.  Orders: Orders Placed This Encounter  Procedures  . Ambulatory referral to Physical Therapy   No orders of the defined types were placed in this encounter.   Follow-Up Instructions: Return if symptoms worsen or fail to improve.   Collier Salina, MD  Note - This record has been created using [employer].  Chart creation errors have been sought, but may not always  have been located. Such creation errors do not reflect on  the standard of medical care.

## 2020-09-04 NOTE — Patient Instructions (Signed)
Your test results do not indicate evidence for the Sjogren's syndrome or for any muscle inflammation as a cause of her symptoms that needs additional work-up or specific treatments.  For the continued fatigue and weakness of your muscles I did not see any inflammation so I think physical therapy would be the next step to try and improve their strength and endurance.  Continue vitamin D replacement is a very good idea for the fatigue especially with the winter and this is being adequately replaced with your current plan.  You should receive a call about scheduling for the physical therapy appointment.

## 2020-09-06 ENCOUNTER — Telehealth: Payer: Self-pay

## 2020-09-06 ENCOUNTER — Other Ambulatory Visit: Payer: Self-pay | Admitting: Family Medicine

## 2020-09-06 NOTE — Telephone Encounter (Signed)
Pt called stating she wanted to see [name] for a follow up and to discuss her visit with Dr. Benjamine Mola from Rheumatology. Pt states she still is not feeling well and would like to see [name] sooner than later. First available regular visit is not until 11/8. Could we schedule her in a same day? Please advise.

## 2020-09-06 NOTE — Telephone Encounter (Signed)
Please advise 

## 2020-09-06 NOTE — Telephone Encounter (Signed)
Pt is scheduled for 1/18

## 2020-09-06 NOTE — Telephone Encounter (Signed)
11/8 would be fine as long as pt is able to make the appt.  I know when she last called we had discussed calling her psychiatrist and I want to make sure she has done that

## 2020-09-06 NOTE — Telephone Encounter (Signed)
Per PCP ok to wait until 11/8

## 2020-09-09 DIAGNOSIS — M9903 Segmental and somatic dysfunction of lumbar region: Secondary | ICD-10-CM | POA: Diagnosis not present

## 2020-09-09 DIAGNOSIS — M5416 Radiculopathy, lumbar region: Secondary | ICD-10-CM | POA: Diagnosis not present

## 2020-09-09 DIAGNOSIS — M530 Cervicocranial syndrome: Secondary | ICD-10-CM | POA: Diagnosis not present

## 2020-09-09 DIAGNOSIS — M9901 Segmental and somatic dysfunction of cervical region: Secondary | ICD-10-CM | POA: Diagnosis not present

## 2020-09-10 DIAGNOSIS — M9903 Segmental and somatic dysfunction of lumbar region: Secondary | ICD-10-CM | POA: Diagnosis not present

## 2020-09-10 DIAGNOSIS — M9901 Segmental and somatic dysfunction of cervical region: Secondary | ICD-10-CM | POA: Diagnosis not present

## 2020-09-10 DIAGNOSIS — M530 Cervicocranial syndrome: Secondary | ICD-10-CM | POA: Diagnosis not present

## 2020-09-10 DIAGNOSIS — M5416 Radiculopathy, lumbar region: Secondary | ICD-10-CM | POA: Diagnosis not present

## 2020-09-11 DIAGNOSIS — M530 Cervicocranial syndrome: Secondary | ICD-10-CM | POA: Diagnosis not present

## 2020-09-11 DIAGNOSIS — M5416 Radiculopathy, lumbar region: Secondary | ICD-10-CM | POA: Diagnosis not present

## 2020-09-11 DIAGNOSIS — M9901 Segmental and somatic dysfunction of cervical region: Secondary | ICD-10-CM | POA: Diagnosis not present

## 2020-09-11 DIAGNOSIS — M9903 Segmental and somatic dysfunction of lumbar region: Secondary | ICD-10-CM | POA: Diagnosis not present

## 2020-09-12 ENCOUNTER — Ambulatory Visit (INDEPENDENT_AMBULATORY_CARE_PROVIDER_SITE_OTHER): Payer: Medicare HMO | Admitting: Obstetrics & Gynecology

## 2020-09-12 ENCOUNTER — Other Ambulatory Visit: Payer: Self-pay

## 2020-09-12 ENCOUNTER — Encounter: Payer: Self-pay | Admitting: Obstetrics & Gynecology

## 2020-09-12 VITALS — BP 114/68 | HR 96 | Resp 16 | Ht 68.25 in | Wt 218.0 lb

## 2020-09-12 DIAGNOSIS — Z01419 Encounter for gynecological examination (general) (routine) without abnormal findings: Secondary | ICD-10-CM

## 2020-09-12 DIAGNOSIS — R69 Illness, unspecified: Secondary | ICD-10-CM | POA: Diagnosis not present

## 2020-09-12 NOTE — Progress Notes (Addendum)
64 y.o. G0P0000 Single White or Caucasian female here for annual exam.  Having some back pain issues.  Pulled her back lifting a heavy box.  She is using a brace and some naproxyn.  Saw Dr. Benjamine Mola in October.  Has elevated ANA.  He thinks she may have Sjogren's.  V12 and Vit D was low as well.    Denies vaginal bleeding.    Stopped HRT.  Doing well.  Patient's last menstrual period was 11/09/2000.          Sexually active: No.  The current method of family planning is status post hysterectomy.    Exercising: Yes.    walking Smoker:  no  Health Maintenance: Pap:  07/03/11 Neg History of abnormal Pap:  no MMG:  04/10/20 BIRADS 1 negative/density b Colonoscopy:  Per patient will call [facility] to schedule.  Aware this is due.   BMD:   07/11/15.  She's already called to get this scheduled.   TDaP:  2013 Pneumonia vaccine(s):  09/02/12  Shingrix:   completed Hep C testing: 10/22/16 Negative Screening Labs: PCP   reports that she has never smoked. She has never used smokeless tobacco. She reports that she does not drink alcohol and does not use drugs.  Past Medical History:  Diagnosis Date  . Allergic rhinitis   . Anxiety   . Arthritis    R knee, Left Knee  . Asthma   . Bipolar disorder ([place])   . Cataract   . Complication of anesthesia    had bad anxiety after anesthesia  . Constipation   . Constipation, chronic   . Depression   . Diabetes mellitus without complication ([place])   . Dysrhythmia    Palpations occ, takes Tenormin  . GERD (gastroesophageal reflux disease)   . Heart murmur   . Hyperlipidemia   . Hypertension   . MVP (mitral valve prolapse)   . Urinary tract infection    frequent, see Urololgist 12/16/2010    Past Surgical History:  Procedure Laterality Date  . ABDOMINAL HYSTERECTOMY  2002   no oophorectomy  . BACK SURGERY  2001   L5 - S1  . CATARACT EXTRACTION    . NASAL SINUS SURGERY  01/2010   x2  . TONSILLECTOMY    . TOTAL KNEE ARTHROPLASTY  12/23/2011    Procedure: TOTAL KNEE ARTHROPLASTY;  Surgeon: Ninetta Lights, MD;  Location: [facility];  Service: Orthopedics;  Laterality: Right;  DR MURPHY WANTS 120 MINUTES FOR SURGERY  . TOTAL KNEE ARTHROPLASTY  08/31/2012   left knee  . TOTAL KNEE ARTHROPLASTY  08/31/2012   Procedure: TOTAL KNEE ARTHROPLASTY;  Surgeon: Ninetta Lights, MD;  Location: [facility];  Service: Orthopedics;  Laterality: Left;    Current Outpatient Medications  Medication Sig Dispense Refill  . Alirocumab (PRALUENT) 150 MG/ML SOAJ Inject 150 mg into the skin every 14 (fourteen) days. 2 pen 11  . ALPHAGAN P 0.1 % SOLN Place 1 drop into both eyes every 8 (eight) hours.     Marland Kitchen azelastine (ASTELIN) 0.1 % nasal spray PLACE 2 SPRAYS INTO EACH NOSTRIL TWO TIMES A DAY AS DIRECTED 30 mL 2  . clonazePAM (KLONOPIN) 2 MG tablet 1 mg 2 (two) times daily.   0  . Cyanocobalamin (VITAMIN B-12 PO) Take by mouth.    . fenofibrate (TRICOR) 145 MG tablet TAKE ONE TABLET BY MOUTH DAILY 30 tablet 3  . fexofenadine (ALLEGRA) 180 MG tablet Take 180 mg by mouth daily.    Marland Kitchen  fluticasone (FLONASE) 50 MCG/ACT nasal spray SPRAY TWO SPRAYS IN EACH NOSTRIL ONCE DAILY 16 mL 1  . fluticasone (FLOVENT HFA) 110 MCG/ACT inhaler Inhale 2 puffs into the lungs in the morning and at bedtime. 1 each 6  . gabapentin (NEURONTIN) 600 MG tablet Take 1,200 mg by mouth 2 (two) times daily.     Marland Kitchen lamoTRIgine (LAMICTAL) 200 MG tablet Take 200 mg by mouth 2 (two) times daily.     Marland Kitchen LUMIGAN 0.01 % SOLN Place 1 drop into both eyes at bedtime.     . methocarbamol (ROBAXIN) 500 MG tablet Take 1 tablet (500 mg total) by mouth every 8 (eight) hours as needed. 15 tablet 0  . metoprolol succinate (TOPROL-XL) 50 MG 24 hr tablet TAKE ONE TABLET BY MOUTH DAILY WITH A MEAL 90 tablet 0  . montelukast (SINGULAIR) 10 MG tablet Take 1 tablet (10 mg total) by mouth at bedtime. 90 tablet 1  . Multiple Vitamin (MULTIVITAMIN WITH MINERALS) TABS tablet Take 1 tablet by mouth daily.     . naproxen  (NAPROSYN) 500 MG tablet Take 1 tablet (500 mg total) by mouth 2 (two) times daily. 10 tablet 0  . OLANZapine (ZYPREXA) 10 MG tablet Take 10 mg by mouth daily as needed.    Marland Kitchen omega-3 acid ethyl esters (LOVAZA) 1 g capsule Take 2 capsules (2 g total) by mouth 2 (two) times daily. 360 capsule 3  . Pyridoxine HCl (VITAMIN B-6 PO) Take by mouth.    . QUEtiapine (SEROQUEL XR) 400 MG 24 hr tablet Take one tablet every evening at bedtime  0  . QUEtiapine (SEROQUEL) 50 MG tablet As needed  3  . venlafaxine (EFFEXOR-XR) 75 MG 24 hr capsule Take 225 mg by mouth daily.     . Vitamin D, Ergocalciferol, (DRISDOL) 1.25 MG (50000 UNIT) CAPS capsule Take 1 capsule (50,000 Units total) by mouth every 7 (seven) days. 12 capsule 0  . ondansetron (ZOFRAN ODT) 4 MG disintegrating tablet Take 1 tablet (4 mg total) by mouth every 8 (eight) hours as needed for nausea or vomiting. (Patient not taking: Reported on 09/12/2020) 20 tablet 0   No current facility-administered medications for this visit.    Family History  Problem Relation Age of Onset  . Coronary artery disease Father   . Heart attack Father 39  . Heart disease Father   . Lung disease Mother        Mycobacterium avium intracellulare  . Hypertension Brother   . Hyperlipidemia Brother   . Anesthesia problems Neg Hx     Review of Systems  All other systems reviewed and are negative.   Exam:   BP 114/68 (BP Location: Right Arm, Patient Position: Sitting, Cuff Size: Large)   Pulse 96   Resp 16   Ht 5' 8.25" (1.734 m)   Wt 218 lb (98.9 kg)   LMP 11/09/2000   BMI 32.90 kg/m   Height: 5' 8.25" (173.4 cm)  General appearance: alert, cooperative and appears stated age Head: Normocephalic, without obvious abnormality, atraumatic Neck: no adenopathy, supple, symmetrical, trachea midline and thyroid normal to inspection and palpation Lungs: clear to auscultation bilaterally Breasts: normal appearance, no masses or tenderness Heart: regular rate and  rhythm Abdomen: soft, non-tender; bowel sounds normal; no masses,  no organomegaly Extremities: extremities normal, atraumatic, no cyanosis or edema Skin: Skin color, texture, turgor normal. No rashes or lesions Lymph nodes: Cervical, supraclavicular, and axillary nodes normal. No abnormal inguinal nodes palpated Neurologic: Grossly normal  Pelvic: External genitalia:  no lesions              Urethra:  normal appearing urethra with no masses, tenderness or lesions              Bartholins and Skenes: normal                 Vagina: normal appearing vagina with normal color and discharge, no lesions              Cervix: absent              Pap taken: No. Bimanual Exam:  Uterus:  uterus absent              Adnexa: no mass, fullness, tenderness               Rectovaginal: Confirms               Anus:  normal sphincter tone, no lesions  Chaperone, Terence Lux, CMA, was present for exam.  A:  Well Woman with normal exam PMP, now off HRT H/o TAh due to fibroids Hypertension H/o bipolar d/o Low Vit D Elevated lipids Recent back injury  P:   Mammogram guidelines reviewed.  Doing yearly. pap smear not indicated BMD due.  She has already called for this. Colonoscopy is due.  States she is going to call and schedule with Dr. Carlean Purl.  Declines help with this. Vaccines up to date Lab work done with [name]. We discussed returning in 2 years as she is now off HRT and returning with any new question or concern.  Pt feels very comfortable with this.

## 2020-09-16 ENCOUNTER — Encounter: Payer: Self-pay | Admitting: Family Medicine

## 2020-09-16 ENCOUNTER — Other Ambulatory Visit: Payer: Self-pay

## 2020-09-16 ENCOUNTER — Ambulatory Visit (INDEPENDENT_AMBULATORY_CARE_PROVIDER_SITE_OTHER): Payer: Medicare HMO | Admitting: Family Medicine

## 2020-09-16 VITALS — BP 134/80 | HR 79 | Temp 99.4°F | Resp 15 | Ht 68.0 in | Wt 221.8 lb

## 2020-09-16 DIAGNOSIS — M5416 Radiculopathy, lumbar region: Secondary | ICD-10-CM | POA: Diagnosis not present

## 2020-09-16 DIAGNOSIS — R768 Other specified abnormal immunological findings in serum: Secondary | ICD-10-CM | POA: Diagnosis not present

## 2020-09-16 DIAGNOSIS — H04123 Dry eye syndrome of bilateral lacrimal glands: Secondary | ICD-10-CM

## 2020-09-16 DIAGNOSIS — M530 Cervicocranial syndrome: Secondary | ICD-10-CM | POA: Diagnosis not present

## 2020-09-16 DIAGNOSIS — M9901 Segmental and somatic dysfunction of cervical region: Secondary | ICD-10-CM | POA: Diagnosis not present

## 2020-09-16 DIAGNOSIS — E559 Vitamin D deficiency, unspecified: Secondary | ICD-10-CM | POA: Diagnosis not present

## 2020-09-16 DIAGNOSIS — M9903 Segmental and somatic dysfunction of lumbar region: Secondary | ICD-10-CM | POA: Diagnosis not present

## 2020-09-16 DIAGNOSIS — E669 Obesity, unspecified: Secondary | ICD-10-CM

## 2020-09-16 NOTE — Assessment & Plan Note (Addendum)
Ongoing issue for pt.  She has recently started walking daily.  Applauded her efforts.  We discussed bariatric surgery but I told her that I didn't feel it was right for her given her hx of depression/mood issues and the recovery process.  Pt is in agreement and will stick w/ diet and exercise.

## 2020-09-16 NOTE — Assessment & Plan Note (Signed)
Pt saw Rheumatology and was very pleased w/ the appt.  She felt he was kind, thorough, and listened to her.  She is glad to know she does not have an auto immune process at this time.

## 2020-09-16 NOTE — Patient Instructions (Addendum)
Follow up as needed or as scheduled Continue the Vit D as directed Keep up the good work on healthy diet and regular exercise- you're doing great! Call with any questions or concerns Stay Safe!  Stay Healthy!

## 2020-09-16 NOTE — Assessment & Plan Note (Signed)
Pt had Rheum workup and this is not Sjogren's syndrome.  Rheum felt ophtho was managing appropriately and no recommendations were made.  Pt expressed understanding and is in agreement w/ plan.

## 2020-09-16 NOTE — Progress Notes (Signed)
   Subjective:    Patient ID: [name], female    DOB: January 21, 1956, 64 y.o.   MRN: [id number]  HPI Rheumatology f/u- pt saw Dr Benjamine Mola on 10/27. Her Sjogren's specific tests were negative and Dr Benjamine Mola felt that her dry eye is being managed appropriately by ophtho.  For musculoskeletal issues, he recommended PT.  Vit D deficiency- She is feeling 'much better' since starting Vit D supplementation.  She is now walking regularly.  Obesity- ongoing issue for pt.  She asked me how I felt about bariatric surgery.  She was on [employer] and failed questionnaire x3.     Review of Systems For ROS see HPI   This visit occurred during the SARS-CoV-2 public health emergency.  Safety protocols were in place, including screening questions prior to the visit, additional usage of staff PPE, and extensive cleaning of exam room while observing appropriate contact time as indicated for disinfecting solutions.       Objective:   Physical Exam Vitals reviewed.  Constitutional:      General: She is not in acute distress.    Appearance: Normal appearance. She is obese. She is not ill-appearing or toxic-appearing.  HENT:     Head: Normocephalic and atraumatic.  Eyes:     Extraocular Movements: Extraocular movements intact.     Conjunctiva/sclera: Conjunctivae normal.     Pupils: Pupils are equal, round, and reactive to light.  Musculoskeletal:     Right lower leg: No edema.     Left lower leg: No edema.  Skin:    General: Skin is warm and dry.     Findings: No rash.  Neurological:     General: No focal deficit present.     Mental Status: She is alert and oriented to person, place, and time.     Cranial Nerves: No cranial nerve deficit.     Motor: No weakness.     Gait: Gait normal.  Psychiatric:        Mood and Affect: Mood normal.        Behavior: Behavior normal.        Thought Content: Thought content normal.           Assessment & Plan:

## 2020-09-16 NOTE — Assessment & Plan Note (Signed)
Pt is feeling much better since starting Vit D supplementation.  Discussed plan for going forward- daily Vit D supplement after finishing 12 weeks of high dose.

## 2020-09-17 DIAGNOSIS — M9901 Segmental and somatic dysfunction of cervical region: Secondary | ICD-10-CM | POA: Diagnosis not present

## 2020-09-17 DIAGNOSIS — M530 Cervicocranial syndrome: Secondary | ICD-10-CM | POA: Diagnosis not present

## 2020-09-17 DIAGNOSIS — M5416 Radiculopathy, lumbar region: Secondary | ICD-10-CM | POA: Diagnosis not present

## 2020-09-17 DIAGNOSIS — M9903 Segmental and somatic dysfunction of lumbar region: Secondary | ICD-10-CM | POA: Diagnosis not present

## 2020-09-18 DIAGNOSIS — M9903 Segmental and somatic dysfunction of lumbar region: Secondary | ICD-10-CM | POA: Diagnosis not present

## 2020-09-18 DIAGNOSIS — M5416 Radiculopathy, lumbar region: Secondary | ICD-10-CM | POA: Diagnosis not present

## 2020-09-18 DIAGNOSIS — M530 Cervicocranial syndrome: Secondary | ICD-10-CM | POA: Diagnosis not present

## 2020-09-18 DIAGNOSIS — M9901 Segmental and somatic dysfunction of cervical region: Secondary | ICD-10-CM | POA: Diagnosis not present

## 2020-09-23 DIAGNOSIS — M9901 Segmental and somatic dysfunction of cervical region: Secondary | ICD-10-CM | POA: Diagnosis not present

## 2020-09-23 DIAGNOSIS — M5416 Radiculopathy, lumbar region: Secondary | ICD-10-CM | POA: Diagnosis not present

## 2020-09-23 DIAGNOSIS — M530 Cervicocranial syndrome: Secondary | ICD-10-CM | POA: Diagnosis not present

## 2020-09-23 DIAGNOSIS — M9903 Segmental and somatic dysfunction of lumbar region: Secondary | ICD-10-CM | POA: Diagnosis not present

## 2020-09-25 DIAGNOSIS — M530 Cervicocranial syndrome: Secondary | ICD-10-CM | POA: Diagnosis not present

## 2020-09-25 DIAGNOSIS — M9903 Segmental and somatic dysfunction of lumbar region: Secondary | ICD-10-CM | POA: Diagnosis not present

## 2020-09-25 DIAGNOSIS — M9901 Segmental and somatic dysfunction of cervical region: Secondary | ICD-10-CM | POA: Diagnosis not present

## 2020-09-25 DIAGNOSIS — M5416 Radiculopathy, lumbar region: Secondary | ICD-10-CM | POA: Diagnosis not present

## 2020-09-26 ENCOUNTER — Other Ambulatory Visit: Payer: Self-pay

## 2020-09-26 ENCOUNTER — Ambulatory Visit: Payer: Medicare HMO

## 2020-09-26 DIAGNOSIS — E782 Mixed hyperlipidemia: Secondary | ICD-10-CM

## 2020-09-26 NOTE — Progress Notes (Signed)
Chronic Care Management Pharm[facility]  Name: [name]   MRN: [id number] DOB: [birth year]  PCP : Midge Minium, MD.  Chief Complaint/ HPI  [name],  64 y.o. , female presents for their Follow-Up CCM visit with the clinical pharmacist via telephone due to COVID-19 Pandemic.  Their chronic conditions include: HTN, HLD, DM, GERD, Depression, asthma  Patient Active Problem List   Diagnosis Date Noted  . Positive ANA (antinuclear antibody) 08/14/2020  . Chronically dry eyes, bilateral 08/14/2020  . Dry mouth 08/14/2020  . Allergic rhinitis due to pollen 12/13/2018  . Diet-controlled diabetes mellitus ([place]) 02/09/2018  . Statin intolerance 11/04/2017  . Family history of early CAD 03/10/2017  . HTN (hypertension) 02/15/2017  . Herniated nucleus pulposus, L5-S1 06/22/2014  . Asthma, moderate persistent 10/09/2013  . Obesity (BMI 30-39.9) 10/09/2013  . Fatigue 12/12/2012  . GERD (gastroesophageal reflux disease) 09/02/2012  . Vitamin D deficiency 09/15/2010  . DEPRESSIVE DISORDER 07/17/2010  . RHINITIS 06/14/2009  . MUSCLE WEAKNESS (GENERALIZED) 01/03/2009  . Hyperlipidemia 10/10/2008  . BIPOLAR AFFECTIVE DISORDER 10/10/2008  . ADD 10/10/2008   Past Surgical History:  Procedure Laterality Date  . ABDOMINAL HYSTERECTOMY  2002   no oophorectomy  . BACK SURGERY  2001   L5 - S1  . CATARACT EXTRACTION    . NASAL SINUS SURGERY  01/2010   x2  . TONSILLECTOMY    . TOTAL KNEE ARTHROPLASTY  12/23/2011   Procedure: TOTAL KNEE ARTHROPLASTY;  Surgeon: Ninetta Lights, MD;  Location: [facility];  Service: Orthopedics;  Laterality: Right;  DR MURPHY WANTS 120 MINUTES FOR SURGERY  . TOTAL KNEE ARTHROPLASTY  08/31/2012   Procedure: TOTAL KNEE ARTHROPLASTY;  Surgeon: Ninetta Lights, MD;  Location: [facility];  Service: Orthopedics;  Laterality: Left;   Social History   Socioeconomic History  . Marital status: Single    Spouse name: Not on file  . Number of children: 0  . Years of  education: 29  . Highest education level: Not on file  Occupational History  . Occupation: retired//disability  Tobacco Use  . Smoking status: Never Smoker  . Smokeless tobacco: Never Used  Vaping Use  . Vaping Use: Never used  Substance and Sexual Activity  . Alcohol use: No    Alcohol/week: 0.0 standard drinks    Comment: Occasional glass of wine/"hardly ever"  . Drug use: No  . Sexual activity: Not Currently    Birth control/protection: Abstinence  Other Topics Concern  . Not on file  Social History Narrative   Lives alone with 2 cats. Has pet sitting business.   2 cups of coffee a day, Rare soda    Social Determinants of Health   Financial Resource Strain: Low Risk   . Difficulty of Paying Living Expenses: Not very hard  Food Insecurity: No Food Insecurity  . Worried About Charity fundraiser in the Last Year: Never true  . Ran Out of Food in the Last Year: Never true  Transportation Needs: No Transportation Needs  . Lack of Transportation (Medical): No  . Lack of Transportation (Non-Medical): No  Physical Activity: Sufficiently Active  . Days of Exercise per Week: 7 days  . Minutes of Exercise per Session: 30 min  Stress: No Stress Concern Present  . Feeling of Stress : Only a little  Social Connections: Socially Isolated  . Frequency of Communication with Friends and Family: More than three times a week  . Frequency of Social Gatherings with Friends  and Family: Once a week  . Attends Religious Services: Never  . Active Member of Clubs or Organizations: No  . Attends Archivist Meetings: Never  . Marital Status: Divorced   Family History  Problem Relation Age of Onset  . Coronary artery disease Father   . Heart attack Father 71  . Heart disease Father   . Lung disease Mother        Mycobacterium avium intracellulare  . Hypertension Brother   . Hyperlipidemia Brother   . Anesthesia problems Neg Hx    Allergies  Allergen Reactions  .  Nitrofurantoin Monohyd Macro Nausea Only    Severe headache  . Statins Other (See Comments)    Severe weakness, pain  . Sulfa Antibiotics Nausea And Vomiting  . Alprazolam Anxiety and Other (See Comments)    Hyper   . Amoxicillin Rash  . Prednisone Anxiety   Outpatient Encounter Medications as of 09/26/2020  Medication Sig  . Alirocumab (PRALUENT) 150 MG/ML SOAJ Inject 150 mg into the skin every 14 (fourteen) days.  . ALPHAGAN P 0.1 % SOLN Place 1 drop into both eyes every 8 (eight) hours.   Marland Kitchen azelastine (ASTELIN) 0.1 % nasal spray PLACE 2 SPRAYS INTO EACH NOSTRIL TWO TIMES A DAY AS DIRECTED  . clonazePAM (KLONOPIN) 2 MG tablet 1 mg 2 (two) times daily.   . Cyanocobalamin (VITAMIN B-12 PO) Take by mouth.  . fenofibrate (TRICOR) 145 MG tablet TAKE ONE TABLET BY MOUTH DAILY  . fexofenadine (ALLEGRA) 180 MG tablet Take 180 mg by mouth daily.  . fluticasone (FLONASE) 50 MCG/ACT nasal spray SPRAY TWO SPRAYS IN EACH NOSTRIL ONCE DAILY  . fluticasone (FLOVENT HFA) 110 MCG/ACT inhaler Inhale 2 puffs into the lungs in the morning and at bedtime.  . gabapentin (NEURONTIN) 600 MG tablet Take 1,200 mg by mouth 2 (two) times daily.   Marland Kitchen lamoTRIgine (LAMICTAL) 200 MG tablet Take 200 mg by mouth 2 (two) times daily.   Marland Kitchen LUMIGAN 0.01 % SOLN Place 1 drop into both eyes at bedtime.   . methocarbamol (ROBAXIN) 500 MG tablet Take 1 tablet (500 mg total) by mouth every 8 (eight) hours as needed.  . metoprolol succinate (TOPROL-XL) 50 MG 24 hr tablet TAKE ONE TABLET BY MOUTH DAILY WITH A MEAL  . montelukast (SINGULAIR) 10 MG tablet Take 1 tablet (10 mg total) by mouth at bedtime. (Patient not taking: Reported on 09/16/2020)  . Multiple Vitamin (MULTIVITAMIN WITH MINERALS) TABS tablet Take 1 tablet by mouth daily.   . naproxen (NAPROSYN) 500 MG tablet Take 1 tablet (500 mg total) by mouth 2 (two) times daily.  Marland Kitchen OLANZapine (ZYPREXA) 10 MG tablet Take 10 mg by mouth daily as needed.  Marland Kitchen omega-3 acid ethyl esters  (LOVAZA) 1 g capsule Take 2 capsules (2 g total) by mouth 2 (two) times daily.  . ondansetron (ZOFRAN ODT) 4 MG disintegrating tablet Take 1 tablet (4 mg total) by mouth every 8 (eight) hours as needed for nausea or vomiting. (Patient not taking: Reported on 09/12/2020)  . Pyridoxine HCl (VITAMIN B-6 PO) Take by mouth.  . QUEtiapine (SEROQUEL XR) 400 MG 24 hr tablet Take one tablet every evening at bedtime  . QUEtiapine (SEROQUEL) 50 MG tablet As needed  . venlafaxine (EFFEXOR-XR) 75 MG 24 hr capsule Take 225 mg by mouth daily.   . Vitamin D, Ergocalciferol, (DRISDOL) 1.25 MG (50000 UNIT) CAPS capsule Take 1 capsule (50,000 Units total) by mouth every 7 (seven) days.  No facility-administered encounter medications on file as of 09/26/2020.   Patient Care Team    Relationship Specialty Notifications Start End  Midge Minium, MD PCP - General   10/22/10    Comment: Merged (Merged)  Midge Minium, MD    10/22/10    Comment: Elberta Spaniel, MD Consulting Physician Gynecology  07/28/17   Jettie Booze, MD Consulting Physician Cardiology  07/28/17   Leta Baptist, MD Consulting Physician Otolaryngology  07/28/17   Brayton Caves, MD Referring Physician Psychiatry  07/28/17   Janeth Rase, NP Nurse Practitioner Adult Health Nurse Practitioner  07/28/17   Darlyne Russian, LCSW Social Worker Licensed Clinical Social Worker  07/28/17   Star Age, MD Attending Physician Neurology  08/03/18   [name], [facility] Pharmacist Pharmacist  02/14/20    Comment: phone number [phone]   Current Diagnosis/Assessment: Goals Addressed            This Visit's Progress   . Hyperlipidemia / exercise   On track    [place] (see longitudinal plan of care for additional care plan information)  Current Barriers:  . Current antihyperlipidemic regimen: Praluent 150 mg every 14 days, fenofibrate 145 mg daily, Lovaza 2 g daily . Previous antihyperlipidemic medications tried: Repatha  - insurance no longer approves . Most recent lipid panel:     Component Value Date/Time   CHOL 165 12/14/2019 0924   CHOL 163 11/14/2018 1447   CHOL 312 (H) 06/19/2015 0924   TRIG 153.0 (H) 12/14/2019 0924   TRIG 657 (H) 06/19/2015 0924   HDL 64.60 12/14/2019 0924   HDL 71 11/14/2018 1447   HDL 44 06/19/2015 0924   CHOLHDL 3 12/14/2019 0924   VLDL 30.6 12/14/2019 0924   LDLCALC 70 12/14/2019 0924   LDLCALC 66 11/14/2018 1447   LDLCALC Comment 06/19/2015 0924   LDLDIRECT 123.0 01/26/2019 1030   Pharmacist Clinical Goal(s):  Marland Kitchen Over the next 90 days, patient will work with PharmD and providers towards optimized antihyperlipidemic therapy o LDL Cholesterol <100   Interventions: . Comprehensive medication review performed; medication list updated in electronic medical record.  Bertram Savin care team collaboration (see longitudinal plan of care)  Patient Self Care Activities:  . Try your best to establish a walking routine with goal of 20-30 minutes 3-5 times a week as baseline. . Please call with any questions!  Initial goal documentation.       Hyperlipidemia   LDL goal < 100     Component Value Date/Time   CHOL 127 08/09/2020 1049   CHOL 163 11/14/2018 1447   CHOL 312 (H) 06/19/2015 0924   TRIG 146.0 08/09/2020 1049   TRIG 657 (H) 06/19/2015 0924   HDL 70.20 08/09/2020 1049   HDL 71 11/14/2018 1447   HDL 44 06/19/2015 0924   CHOLHDL 2 08/09/2020 1049   VLDL 29.2 08/09/2020 1049   LDLCALC 28 08/09/2020 1049   LDLCALC 66 11/14/2018 1447   LDLCALC Comment 06/19/2015 0924   LDLDIRECT 123.0 01/26/2019 1030   LABVLDL 26 11/14/2018 1447   The ASCVD Risk score (Goff DC Jr., et al., 2013) failed to calculate for the following reasons:   The valid total cholesterol range is 130 to 320 mg/dL   Previous medications: Repatha (cost), statins (intolerance).  LDL at goal 12/2019 lipid panel. Patient is currently controlled on the following medications:   Praluent  150 mg every 14 days  Fenofibrate 145 mg daily  Lovaza 2 g daily  We discussed: Exercise recommendations. Is walking 30 minutes once every day.  Reviewed PAP for Praluent - pt confirmed that this has been approved.   Plan  Continue current medications and control with diet and exercise.   Medication Management / Care coordination   Pt uses [facility] for all medications. Denies any issues with current medication management.   Praluent PAP approved.    PT reports Dr Satira Sark had not received - Alphagan, Lumigan PAP applications this is being faxd once more today - successful transmission confirmed 09/26/2020 at 1:59 pm.   Plan  Continue current medication mangement.  [place] follow up: 2 month phone visit ____________________________ Future Appointments  Date Time Provider [place]  12/16/2020 10:15 AM [name], NP [facility] None  04/25/2021  1:30 PM [facility] PHARMACIST [facility]  09/01/2021  3:00 PM [facility] HEALTH COACH [facility]   [name], Pharm.D., BCGP Clinical Pharmacist [facility] [phone]

## 2020-09-26 NOTE — Patient Instructions (Addendum)
Joanie,  I'm glad to hear the Praluent patient assistance has been approved.I confirmed that the patient assistance applications for Alphagan and Lumigan were successfully sent to Dr. Satira Sark. If you do not hear anything within 2 weeks please give me a call at [phone]!  Thanks! Ellin Mayhew, Pharm.D., BCGP Clinical Pharmacist [facility] [phone]  Goals Addressed            This Visit's Progress   . Hyperlipidemia / exercise   On track    [place] (see longitudinal plan of care for additional care plan information)  Current Barriers:  . Current antihyperlipidemic regimen: Praluent 150 mg every 14 days, fenofibrate 145 mg daily, Lovaza 2 g daily . Previous antihyperlipidemic medications tried: Repatha - insurance no longer approves . Most recent lipid panel:     Component Value Date/Time   CHOL 165 12/14/2019 0924   CHOL 163 11/14/2018 1447   CHOL 312 (H) 06/19/2015 0924   TRIG 153.0 (H) 12/14/2019 0924   TRIG 657 (H) 06/19/2015 0924   HDL 64.60 12/14/2019 0924   HDL 71 11/14/2018 1447   HDL 44 06/19/2015 0924   CHOLHDL 3 12/14/2019 0924   VLDL 30.6 12/14/2019 0924   LDLCALC 70 12/14/2019 0924   LDLCALC 66 11/14/2018 1447   LDLCALC Comment 06/19/2015 0924   LDLDIRECT 123.0 01/26/2019 1030   Pharmacist Clinical Goal(s):  Marland Kitchen Over the next 90 days, patient will work with PharmD and providers towards optimized antihyperlipidemic therapy o LDL Cholesterol <100   Interventions: . Comprehensive medication review performed; medication list updated in electronic medical record.  Bertram Savin care team collaboration (see longitudinal plan of care)  Patient Self Care Activities:  . Try your best to establish a walking routine with goal of 20-30 minutes 3-5 times a week as baseline. . Please call with any questions!  Initial goal documentation.       The patient verbalized understanding of instructions provided today and agreed to  receive a mailed copy of patient instruction and/or educational materials. Telephone follow up appointment with pharmacy team member scheduled for: See next appointment with "Care Management Staff" under "What's Next" below.   [name], Pharm.D., BCGP Clinical Pharmacist [facility] [phone]

## 2020-09-29 DIAGNOSIS — G4733 Obstructive sleep apnea (adult) (pediatric): Secondary | ICD-10-CM | POA: Diagnosis not present

## 2020-09-30 ENCOUNTER — Other Ambulatory Visit: Payer: Self-pay

## 2020-09-30 ENCOUNTER — Encounter: Payer: Self-pay | Admitting: Physician Assistant

## 2020-09-30 ENCOUNTER — Telehealth (INDEPENDENT_AMBULATORY_CARE_PROVIDER_SITE_OTHER): Payer: Medicare HMO | Admitting: Physician Assistant

## 2020-09-30 DIAGNOSIS — R040 Epistaxis: Secondary | ICD-10-CM | POA: Diagnosis not present

## 2020-09-30 DIAGNOSIS — M5416 Radiculopathy, lumbar region: Secondary | ICD-10-CM | POA: Diagnosis not present

## 2020-09-30 DIAGNOSIS — M9901 Segmental and somatic dysfunction of cervical region: Secondary | ICD-10-CM | POA: Diagnosis not present

## 2020-09-30 DIAGNOSIS — M9903 Segmental and somatic dysfunction of lumbar region: Secondary | ICD-10-CM | POA: Diagnosis not present

## 2020-09-30 DIAGNOSIS — M530 Cervicocranial syndrome: Secondary | ICD-10-CM | POA: Diagnosis not present

## 2020-09-30 NOTE — Progress Notes (Signed)
Virtual Visit via Video   I connected with patient on 09/30/20 at  1:30 PM EST by a video enabled telemedicine application and verified that I am speaking with the correct person using two identifiers.  Location patient: Home Location provider: [facility], Office Persons participating in the virtual visit: Patient, Provider, [place] (Patina Moore)  I discussed the limitations of evaluation and management by telemedicine and the availability of in person appointments. The patient expressed understanding and agreed to proceed.  Subjective:   HPI:   Patient presents via [facility] today c/o 2 episodes of waking up with postnasal drainage, coughing and mild blood. Notes only occurring at night. Denies any sinus pressure, sinus pain, ear pain or tooth pain. Denies fevers or chills. Denies chest congestion or cough. Has history of allergic rhinitis. She notes using her Astelin and Flonase nightly with significant dryness of her nose.   ROS:   See pertinent positives and negatives per HPI.  Patient Active Problem List   Diagnosis Date Noted   Positive ANA (antinuclear antibody) 08/14/2020   Chronically dry eyes, bilateral 08/14/2020   Dry mouth 08/14/2020   Allergic rhinitis due to pollen 12/13/2018   Diet-controlled diabetes mellitus ([place]) 02/09/2018   Statin intolerance 11/04/2017   Family history of early CAD 03/10/2017   HTN (hypertension) 02/15/2017   Herniated nucleus pulposus, L5-S1 06/22/2014   Asthma, moderate persistent 10/09/2013   Obesity (BMI 30-39.9) 10/09/2013   Fatigue 12/12/2012   GERD (gastroesophageal reflux disease) 09/02/2012   Vitamin D deficiency 09/15/2010   DEPRESSIVE DISORDER 07/17/2010   RHINITIS 06/14/2009   MUSCLE WEAKNESS (GENERALIZED) 01/03/2009   Hyperlipidemia 10/10/2008   BIPOLAR AFFECTIVE DISORDER 10/10/2008   ADD 10/10/2008    Social History   Tobacco Use   Smoking status: Never Smoker   Smokeless tobacco:  Never Used  Substance Use Topics   Alcohol use: No    Alcohol/week: 0.0 standard drinks    Comment: Occasional glass of wine/"hardly ever"    Current Outpatient Medications:    Alirocumab (PRALUENT) 150 MG/ML SOAJ, Inject 150 mg into the skin every 14 (fourteen) days., Disp: 2 pen, Rfl: 11   ALPHAGAN P 0.1 % SOLN, Place 1 drop into both eyes every 8 (eight) hours. , Disp: , Rfl:    azelastine (ASTELIN) 0.1 % nasal spray, PLACE 2 SPRAYS INTO EACH NOSTRIL TWO TIMES A DAY AS DIRECTED, Disp: 30 mL, Rfl: 2   clonazePAM (KLONOPIN) 2 MG tablet, 1 mg 2 (two) times daily. , Disp: , Rfl: 0   Cyanocobalamin (VITAMIN B-12 PO), Take by mouth., Disp: , Rfl:    fenofibrate (TRICOR) 145 MG tablet, TAKE ONE TABLET BY MOUTH DAILY, Disp: 30 tablet, Rfl: 3   fexofenadine (ALLEGRA) 180 MG tablet, Take 180 mg by mouth daily., Disp: , Rfl:    fluticasone (FLONASE) 50 MCG/ACT nasal spray, SPRAY TWO SPRAYS IN EACH NOSTRIL ONCE DAILY, Disp: 16 mL, Rfl: 1   fluticasone (FLOVENT HFA) 110 MCG/ACT inhaler, Inhale 2 puffs into the lungs in the morning and at bedtime., Disp: 1 each, Rfl: 6   gabapentin (NEURONTIN) 600 MG tablet, Take 1,200 mg by mouth 2 (two) times daily. , Disp: , Rfl:    lamoTRIgine (LAMICTAL) 200 MG tablet, Take 200 mg by mouth 2 (two) times daily. , Disp: , Rfl:    LUMIGAN 0.01 % SOLN, Place 1 drop into both eyes at bedtime. , Disp: , Rfl:    methocarbamol (ROBAXIN) 500 MG tablet, Take 1 tablet (500 mg total)  by mouth every 8 (eight) hours as needed., Disp: 15 tablet, Rfl: 0   metoprolol succinate (TOPROL-XL) 50 MG 24 hr tablet, TAKE ONE TABLET BY MOUTH DAILY WITH A MEAL, Disp: 90 tablet, Rfl: 0   montelukast (SINGULAIR) 10 MG tablet, Take 1 tablet (10 mg total) by mouth at bedtime., Disp: 90 tablet, Rfl: 1   Multiple Vitamin (MULTIVITAMIN WITH MINERALS) TABS tablet, Take 1 tablet by mouth daily. , Disp: , Rfl:    naproxen (NAPROSYN) 500 MG tablet, Take 1 tablet (500 mg total) by mouth  2 (two) times daily., Disp: 10 tablet, Rfl: 0   OLANZapine (ZYPREXA) 10 MG tablet, Take 10 mg by mouth daily as needed., Disp: , Rfl:    omega-3 acid ethyl esters (LOVAZA) 1 g capsule, Take 2 capsules (2 g total) by mouth 2 (two) times daily., Disp: 360 capsule, Rfl: 3   ondansetron (ZOFRAN ODT) 4 MG disintegrating tablet, Take 1 tablet (4 mg total) by mouth every 8 (eight) hours as needed for nausea or vomiting., Disp: 20 tablet, Rfl: 0   Pyridoxine HCl (VITAMIN B-6 PO), Take by mouth., Disp: , Rfl:    QUEtiapine (SEROQUEL XR) 400 MG 24 hr tablet, Take one tablet every evening at bedtime, Disp: , Rfl: 0   QUEtiapine (SEROQUEL) 50 MG tablet, As needed, Disp: , Rfl: 3   venlafaxine (EFFEXOR-XR) 75 MG 24 hr capsule, Take 225 mg by mouth daily. , Disp: , Rfl:    Vitamin D, Ergocalciferol, (DRISDOL) 1.25 MG (50000 UNIT) CAPS capsule, Take 1 capsule (50,000 Units total) by mouth every 7 (seven) days., Disp: 12 capsule, Rfl: 0  Allergies  Allergen Reactions   Nitrofurantoin Monohyd Macro Nausea Only    Severe headache   Statins Other (See Comments)    Severe weakness, pain   Sulfa Antibiotics Nausea And Vomiting   Alprazolam Anxiety and Other (See Comments)    Hyper    Amoxicillin Rash   Prednisone Anxiety    Objective:   LMP 11/09/2000   Patient is well-developed, well-nourished in no acute distress.  Resting comfortably at home.  Head is normocephalic, atraumatic.  No labored breathing.  Speech is clear and coherent with logical content.  Patient is alert and oriented at baseline.   Assessment and Plan:   1. Epistaxis Secondary to over-drying of nasal mucosa from her medications. She has been instructed to put a humidifier in the bedroom and run at night. Increase fluids. Start saline nasal rinse or gel. Start vaseline application just inside the nares. Cut down to Asteline just once daily in the morning. Hold Flonase for now. Follow-up in office if not resolving.      Leeanne Rio, PA-C 09/30/2020

## 2020-09-30 NOTE — Progress Notes (Signed)
I have discussed the procedure for the virtual visit with the patient who has given consent to proceed with assessment and treatment.   Deejay Koppelman S Keante Urizar, CMA     

## 2020-09-30 NOTE — Patient Instructions (Signed)
Instructions sent to MyChart

## 2020-10-01 ENCOUNTER — Telehealth: Payer: Medicare HMO | Admitting: Physician Assistant

## 2020-10-02 ENCOUNTER — Telehealth: Payer: Self-pay | Admitting: Pharmacist

## 2020-10-02 DIAGNOSIS — M5416 Radiculopathy, lumbar region: Secondary | ICD-10-CM | POA: Diagnosis not present

## 2020-10-02 DIAGNOSIS — G4733 Obstructive sleep apnea (adult) (pediatric): Secondary | ICD-10-CM | POA: Diagnosis not present

## 2020-10-02 DIAGNOSIS — M530 Cervicocranial syndrome: Secondary | ICD-10-CM | POA: Diagnosis not present

## 2020-10-02 DIAGNOSIS — M9903 Segmental and somatic dysfunction of lumbar region: Secondary | ICD-10-CM | POA: Diagnosis not present

## 2020-10-02 DIAGNOSIS — M9901 Segmental and somatic dysfunction of cervical region: Secondary | ICD-10-CM | POA: Diagnosis not present

## 2020-10-02 NOTE — Telephone Encounter (Signed)
Called patient to see if she was approved from the PASS program. Received a fax from cover [employer] that praluent isnt covered. Im not sure who sent in Rx Left VM

## 2020-10-02 NOTE — Telephone Encounter (Signed)
I spoke to patient who states that she was approved for Praluent patient assistance and has received her shipment. Im unsure why a PA populated.

## 2020-10-07 DIAGNOSIS — M9903 Segmental and somatic dysfunction of lumbar region: Secondary | ICD-10-CM | POA: Diagnosis not present

## 2020-10-07 DIAGNOSIS — M9901 Segmental and somatic dysfunction of cervical region: Secondary | ICD-10-CM | POA: Diagnosis not present

## 2020-10-07 DIAGNOSIS — M530 Cervicocranial syndrome: Secondary | ICD-10-CM | POA: Diagnosis not present

## 2020-10-07 DIAGNOSIS — M5416 Radiculopathy, lumbar region: Secondary | ICD-10-CM | POA: Diagnosis not present

## 2020-10-15 DIAGNOSIS — R69 Illness, unspecified: Secondary | ICD-10-CM | POA: Diagnosis not present

## 2020-10-16 DIAGNOSIS — M5416 Radiculopathy, lumbar region: Secondary | ICD-10-CM | POA: Insufficient documentation

## 2020-10-16 DIAGNOSIS — R030 Elevated blood-pressure reading, without diagnosis of hypertension: Secondary | ICD-10-CM | POA: Insufficient documentation

## 2020-10-16 DIAGNOSIS — G959 Disease of spinal cord, unspecified: Secondary | ICD-10-CM | POA: Diagnosis not present

## 2020-10-24 DIAGNOSIS — G4733 Obstructive sleep apnea (adult) (pediatric): Secondary | ICD-10-CM | POA: Diagnosis not present

## 2020-10-25 DIAGNOSIS — L718 Other rosacea: Secondary | ICD-10-CM | POA: Diagnosis not present

## 2020-10-25 DIAGNOSIS — L738 Other specified follicular disorders: Secondary | ICD-10-CM | POA: Diagnosis not present

## 2020-10-29 DIAGNOSIS — G4733 Obstructive sleep apnea (adult) (pediatric): Secondary | ICD-10-CM | POA: Diagnosis not present

## 2020-11-01 DIAGNOSIS — G4733 Obstructive sleep apnea (adult) (pediatric): Secondary | ICD-10-CM | POA: Diagnosis not present

## 2020-11-05 ENCOUNTER — Other Ambulatory Visit: Payer: Self-pay

## 2020-11-05 ENCOUNTER — Encounter: Payer: Self-pay | Admitting: Family Medicine

## 2020-11-05 ENCOUNTER — Ambulatory Visit (INDEPENDENT_AMBULATORY_CARE_PROVIDER_SITE_OTHER): Payer: Medicare HMO | Admitting: Family Medicine

## 2020-11-05 VITALS — BP 122/68 | HR 77 | Temp 99.2°F | Resp 20 | Ht 68.0 in | Wt 225.6 lb

## 2020-11-05 DIAGNOSIS — L719 Rosacea, unspecified: Secondary | ICD-10-CM | POA: Diagnosis not present

## 2020-11-05 DIAGNOSIS — R69 Illness, unspecified: Secondary | ICD-10-CM | POA: Diagnosis not present

## 2020-11-05 NOTE — Progress Notes (Signed)
   Subjective:    Patient ID: [name], female    DOB: [birth year], 64 y.o.   MRN: [id number]  HPI Facial rash- pt was told she has Rosacea.  Saw Dr Michell Heinrich at [facility].  'numerous whiteheads under my skin'.  Went to aesthetician who 'opened them up'.  She states the creams she was given is 'burning my face'.   Creams are Tretinoin and Metronidazole.   Review of Systems For ROS see HPI   This visit occurred during the SARS-CoV-2 public health emergency.  Safety protocols were in place, including screening questions prior to the visit, additional usage of staff PPE, and extensive cleaning of exam room while observing appropriate contact time as indicated for disinfecting solutions.       Objective:   Physical Exam Vitals reviewed.  Constitutional:      General: She is not in acute distress.    Appearance: Normal appearance.  HENT:     Head: Normocephalic and atraumatic.  Eyes:     Extraocular Movements: Extraocular movements intact.     Conjunctiva/sclera: Conjunctivae normal.     Pupils: Pupils are equal, round, and reactive to light.  Skin:    General: Skin is warm and dry.     Comments: Rosacea papules most prominent in bilateral nasolabial folds  Neurological:     General: No focal deficit present.     Mental Status: She is alert and oriented to person, place, and time.  Psychiatric:        Mood and Affect: Mood normal.        Behavior: Behavior normal.        Thought Content: Thought content normal.           Assessment & Plan:  Rosacea- new.  Pt has seen Derm and been to aesthetician for this issue.  She reports that the Metrogel and RetinA are both burning her face but the burning worsens when she applies face lotion on top of creams.  Then applies makeup.  Told her this is likely too much product at one time.  Encouraged her to start w/ Metrogel once daily and avoid other face lotions, makeup, etc.  If no improvement in sxs, she is to let Dr Michell Heinrich know.  Pt  expressed understanding and is in agreement w/ plan.

## 2020-11-05 NOTE — Patient Instructions (Addendum)
Follow up as needed or as scheduled [facility] your face w/ a gentle cleanser (Cetaphil, etc) twice daily USE the Metronidazole gel once daily after washing your face AVOID other products at this time Call with any questions or concerns Happy New Year!

## 2020-11-06 ENCOUNTER — Telehealth: Payer: Self-pay | Admitting: Neurology

## 2020-11-06 DIAGNOSIS — Z789 Other specified health status: Secondary | ICD-10-CM

## 2020-11-06 DIAGNOSIS — R519 Headache, unspecified: Secondary | ICD-10-CM

## 2020-11-06 DIAGNOSIS — G4733 Obstructive sleep apnea (adult) (pediatric): Secondary | ICD-10-CM

## 2020-11-06 NOTE — Telephone Encounter (Signed)
Called the patient and reviewed in detailed the recommendation made by Dr Frances Furbish. Advised that I will forward the order to [employer] for them to get her set up with ONO on CPAP as well as decreasing her pressure on her machine. I advised she recommended MRI of brain to compare. For now the patient would like to assess if this could be oxygen related or CPAP related. She states if it doesn't get better then she will move forward with MRI. Pt also made aware for anxiety concerns continue to follow with psychiatrist. Pt verbalized understanding and was really appreciative for her recommendations and call back. Order sent to [employer].

## 2020-11-06 NOTE — Telephone Encounter (Signed)
Please call patient regarding her headaches and waking up in a panic.  I would like to suggest a couple of things, we can reduce her CPAP pressure to 10 cm to see if the pressure feels easier to tolerate.  She is compliant with her treatment.  Average AHI is 2.5/h but leak on the higher side.  With a pressure reduction, her air leakage may improve as well.  She is encouraged to follow-up with her psychiatrist regarding anxiety and panic symptoms.  We can also proceed with a brain MRI with and without contrast and compare with findings from 2006.  I would also like to do an oxygen test while she is on her CPAP.  We will schedule this through her DME company.  Please encourage her to continue to be fully compliant with her CPAP.  We will call with updates on her oxygen test results as well as brain MRI.

## 2020-11-06 NOTE — Telephone Encounter (Signed)
Pt called stating that she needed a call back from RN because she has been waking up in the night with a headache and sweating. Best call back is [phone].

## 2020-11-06 NOTE — Telephone Encounter (Signed)
Called the patient back. She states that for the last 2 months that she has been waking up in the middle of the night with her head busting (headache) and heart racing. She states she is unsure if this is panic attack or anxiety related. She stated that since she thought it could be she attempted to take her clonazepam and seroquel to help calm and relax and sometimes she is able to go back to sleep other times she is not and is unsure why she has the horrible head pain. She uses her CPAP nightly. She describes that even though she wakes up during the night with this happening if she falls back asleep she still wakes up with headache just may be dull. She states that she will have to take ibuprofen to try and help with headache. This has only been over the last 2 months.  Advised the patient I would print her report from her CPAP. Informed I would discuss with Dr Frances Furbish and see what her thoughts are and if she has recommendation to see if something could be causing this. Pt does follow with a psychiatrist for anxiety concerns.

## 2020-11-12 ENCOUNTER — Telehealth: Payer: Self-pay

## 2020-11-12 DIAGNOSIS — R69 Illness, unspecified: Secondary | ICD-10-CM | POA: Diagnosis not present

## 2020-11-12 NOTE — Progress Notes (Signed)
Patient called and left message regarding patient assistant applications for her eye drops lumigan and alphagan.   Returned patients call and informed her we did send out the applications for her eye drops to Dr. Burgess Estelle . Informed patient we would follow up on application.  Aloha Gell ,[facility] Clinical Pharmacist Assistant [phone]

## 2020-11-18 ENCOUNTER — Telehealth: Payer: Self-pay

## 2020-11-18 NOTE — Telephone Encounter (Signed)
  Chronic Care Management   Outreach Note   Name: [name] MRN: [id number] DOB: Oct 14, 1956  Referred by: Midge Minium, MD  Left message with patient concerning patient assistance applications sent to Dr. Mellissa Kohut and pt not hearing back on the applications from manufacturer or Dr. Satira Sark.   I left message a with Dr. Satira Sark to see if they might have an update. I have not been able to get through to patient assistance representative w/ [employer] for status update due to excessive hold times.   [name], Pharm.D., BCGP Clinical Pharmacist [facility] [phone]

## 2020-11-19 ENCOUNTER — Other Ambulatory Visit: Payer: Self-pay | Admitting: Emergency Medicine

## 2020-11-19 DIAGNOSIS — R52 Pain, unspecified: Secondary | ICD-10-CM | POA: Diagnosis not present

## 2020-11-19 DIAGNOSIS — I10 Essential (primary) hypertension: Secondary | ICD-10-CM

## 2020-11-19 DIAGNOSIS — L738 Other specified follicular disorders: Secondary | ICD-10-CM | POA: Diagnosis not present

## 2020-11-19 DIAGNOSIS — D1801 Hemangioma of skin and subcutaneous tissue: Secondary | ICD-10-CM | POA: Diagnosis not present

## 2020-11-19 MED ORDER — METOPROLOL SUCCINATE ER 50 MG PO TB24: ORAL_TABLET | ORAL | 2 refills | Status: DC

## 2020-11-23 ENCOUNTER — Other Ambulatory Visit: Payer: Self-pay | Admitting: Neurosurgery

## 2020-11-23 DIAGNOSIS — M5416 Radiculopathy, lumbar region: Secondary | ICD-10-CM

## 2020-11-23 DIAGNOSIS — G959 Disease of spinal cord, unspecified: Secondary | ICD-10-CM

## 2020-11-24 ENCOUNTER — Other Ambulatory Visit: Payer: Medicare HMO

## 2020-11-24 DIAGNOSIS — G4733 Obstructive sleep apnea (adult) (pediatric): Secondary | ICD-10-CM | POA: Diagnosis not present

## 2020-11-26 ENCOUNTER — Telehealth: Payer: Self-pay

## 2020-11-26 NOTE — Chronic Care Management (AMB) (Signed)
Chronic Care Management Pharmacy Assistant   Name: AVYANNA SPADA  MRN: [id number] DOB: Feb 23, 1956  Reason for Encounter: Disease State/ General Adherence Call  PCP : Midge Minium, MD  Allergies:   Allergies  Allergen Reactions  . Nitrofurantoin Monohyd Macro Nausea Only    Severe headache  . Statins Other (See Comments)    Severe weakness, pain  . Sulfa Antibiotics Nausea And Vomiting  . Alprazolam Anxiety and Other (See Comments)    Hyper   . Amoxicillin Rash  . Prednisone Anxiety    Medications: Outpatient Encounter Medications as of 11/26/2020  Medication Sig  . Alirocumab (PRALUENT) 150 MG/ML SOAJ Inject 150 mg into the skin every 14 (fourteen) days.  . ALPHAGAN P 0.1 % SOLN Place 1 drop into both eyes every 8 (eight) hours.   Marland Kitchen azelastine (ASTELIN) 0.1 % nasal spray PLACE 2 SPRAYS INTO EACH NOSTRIL TWO TIMES A DAY AS DIRECTED  . clonazePAM (KLONOPIN) 2 MG tablet 1 mg 2 (two) times daily.   . Cyanocobalamin (VITAMIN B-12 PO) Take by mouth.  . fenofibrate (TRICOR) 145 MG tablet TAKE ONE TABLET BY MOUTH DAILY  . fexofenadine (ALLEGRA) 180 MG tablet Take 180 mg by mouth daily.  . fluticasone (FLONASE) 50 MCG/ACT nasal spray SPRAY TWO SPRAYS IN EACH NOSTRIL ONCE DAILY  . fluticasone (FLOVENT HFA) 110 MCG/ACT inhaler Inhale 2 puffs into the lungs in the morning and at bedtime.  . gabapentin (NEURONTIN) 600 MG tablet Take 1,200 mg by mouth 2 (two) times daily.   Marland Kitchen lamoTRIgine (LAMICTAL) 200 MG tablet Take 200 mg by mouth 2 (two) times daily.  Marland Kitchen LUMIGAN 0.01 % SOLN Place 1 drop into both eyes at bedtime.   . methocarbamol (ROBAXIN) 500 MG tablet Take 1 tablet (500 mg total) by mouth every 8 (eight) hours as needed.  . metoprolol succinate (TOPROL-XL) 50 MG 24 hr tablet TAKE ONE TABLET BY MOUTH DAILY WITH A MEAL  . montelukast (SINGULAIR) 10 MG tablet Take 1 tablet (10 mg total) by mouth at bedtime.  . Multiple Vitamin (MULTIVITAMIN WITH MINERALS) TABS tablet Take 1  tablet by mouth daily.   . naproxen (NAPROSYN) 500 MG tablet Take 1 tablet (500 mg total) by mouth 2 (two) times daily.  Marland Kitchen OLANZapine (ZYPREXA) 10 MG tablet Take 10 mg by mouth daily as needed.  Marland Kitchen omega-3 acid ethyl esters (LOVAZA) 1 g capsule Take 2 capsules (2 g total) by mouth 2 (two) times daily.  . ondansetron (ZOFRAN ODT) 4 MG disintegrating tablet Take 1 tablet (4 mg total) by mouth every 8 (eight) hours as needed for nausea or vomiting.  . Pyridoxine HCl (VITAMIN B-6 PO) Take by mouth.  . QUEtiapine (SEROQUEL XR) 400 MG 24 hr tablet Take one tablet every evening at bedtime  . QUEtiapine (SEROQUEL) 50 MG tablet As needed  . venlafaxine (EFFEXOR-XR) 75 MG 24 hr capsule Take 225 mg by mouth daily.  . Vitamin D, Ergocalciferol, (DRISDOL) 1.25 MG (50000 UNIT) CAPS capsule Take 1 capsule (50,000 Units total) by mouth every 7 (seven) days. (Patient not taking: Reported on 11/05/2020)   No facility-administered encounter medications on file as of 11/26/2020.    Current Diagnosis: Patient Active Problem List   Diagnosis Date Noted  . Positive ANA (antinuclear antibody) 08/14/2020  . Chronically dry eyes, bilateral 08/14/2020  . Dry mouth 08/14/2020  . Allergic rhinitis due to pollen 12/13/2018  . Diet-controlled diabetes mellitus ([place]) 02/09/2018  . Statin intolerance 11/04/2017  . Family  history of early CAD 03/10/2017  . HTN (hypertension) 02/15/2017  . Herniated nucleus pulposus, L5-S1 06/22/2014  . Asthma, moderate persistent 10/09/2013  . Obesity (BMI 30-39.9) 10/09/2013  . Fatigue 12/12/2012  . GERD (gastroesophageal reflux disease) 09/02/2012  . Vitamin D deficiency 09/15/2010  . DEPRESSIVE DISORDER 07/17/2010  . RHINITIS 06/14/2009  . MUSCLE WEAKNESS (GENERALIZED) 01/03/2009  . Hyperlipidemia 10/10/2008  . BIPOLAR AFFECTIVE DISORDER 10/10/2008  . ADD 10/10/2008    Have you seen any other providers since your last visit with [name], Pharm.D., BCGP ?  09/30/2020 - VV  Raiford Noble C, PA-C, Epistaxis, Increase fluids. Start saline nasal rinse or gel. Start Vaseline application just inside the nares. Cut down to Astelin just once daily in the morning. Hold Flonase for now.  11/05/2020 - Adelina Mings, MD, Rosacea- new,  [facility] your face w/ a gentle cleanser (Cetaphil, etc) twice daily, USE the Metronidazole gel once daily after washing your face, AVOID other products at this time.  Have you had any problems recently with your health?  Patient states she has not had any problems recently with her health.  Have you had any problems with your pharmacy?  Patient states she has not had any problems with her pharmacy.  What issues or side effects are you having with your medications?  Patient states she has been experiencing dry mouth shortly after taking her medications in the morning. Patient states she is not sure which medication causes her mouth to be dry as she takes several at a time.  What would you like me to pass along to [name], [place].D., BCGP for them to help you with?   Patient is currently awaiting response from patient assistance for medications Alphagan and Lumigan. Per last telephone message from [name], [place].D., BCGP I have not been able to get through to  patient assistance representative w/ myabb[employer] for status update due to excessive hold times. Also, Georgiana Shore has also reached out and again unable to get through to patient assistance representative w/ my abbvie for status update due to excessive hold times. Patient aware and verbally understands.  What can we do to take care of you better?  Patient states she understands we are doing our best and there is nothing else we can do for her at this time.  April D Calhoun, [place] Pharmacist Assistant [phone]   Follow-Up:  Pharmacist Review

## 2020-11-27 DIAGNOSIS — R69 Illness, unspecified: Secondary | ICD-10-CM | POA: Diagnosis not present

## 2020-12-01 ENCOUNTER — Other Ambulatory Visit: Payer: Self-pay

## 2020-12-01 ENCOUNTER — Ambulatory Visit
Admission: RE | Admit: 2020-12-01 | Discharge: 2020-12-01 | Disposition: A | Discharge status: Home / Self Care | Payer: Medicare HMO | Source: Ambulatory Visit | Attending: Neurology | Admitting: Neurology

## 2020-12-01 DIAGNOSIS — J320 Chronic maxillary sinusitis: Secondary | ICD-10-CM | POA: Diagnosis not present

## 2020-12-01 DIAGNOSIS — J3489 Other specified disorders of nose and nasal sinuses: Secondary | ICD-10-CM | POA: Diagnosis not present

## 2020-12-01 DIAGNOSIS — R519 Headache, unspecified: Secondary | ICD-10-CM | POA: Diagnosis not present

## 2020-12-01 DIAGNOSIS — Z789 Other specified health status: Secondary | ICD-10-CM

## 2020-12-01 DIAGNOSIS — G9389 Other specified disorders of brain: Secondary | ICD-10-CM | POA: Diagnosis not present

## 2020-12-01 DIAGNOSIS — J322 Chronic ethmoidal sinusitis: Secondary | ICD-10-CM | POA: Diagnosis not present

## 2020-12-01 DIAGNOSIS — G4733 Obstructive sleep apnea (adult) (pediatric): Secondary | ICD-10-CM

## 2020-12-01 MED ORDER — GADOBENATE DIMEGLUMINE 529 MG/ML IV SOLN
20.0000 mL | Freq: Once | INTRAVENOUS | Status: AC | PRN
  Administered 2020-12-01: 20 mL via INTRAVENOUS

## 2020-12-02 DIAGNOSIS — G4733 Obstructive sleep apnea (adult) (pediatric): Secondary | ICD-10-CM | POA: Diagnosis not present

## 2020-12-04 ENCOUNTER — Telehealth: Payer: Self-pay

## 2020-12-04 DIAGNOSIS — R519 Headache, unspecified: Secondary | ICD-10-CM

## 2020-12-04 DIAGNOSIS — G935 Compression of brain: Secondary | ICD-10-CM

## 2020-12-04 NOTE — Telephone Encounter (Signed)
I called the pt and advised of results. She verbalized understanding and is agreeable to Neurosurgery consult.   Pt has seen Dr. Trenton Gammon in the past at [facility] Neurosurgery.

## 2020-12-04 NOTE — Telephone Encounter (Signed)
-----   Message from Star Age, MD sent at 12/04/2020  7:55 AM EST ----- Please call patient regarding her brain MRI.  Compared to 2006, her Chiari malformation has progressed meaning that the lower portion of the cerebellum (small brain in the back) extends into the beginning of the spinal canal.  This has become worse compared to 2006.  I recommend evaluation with a neurosurgeon to see if surgery is indicated.  Surgical decompression may help with her headaches.  If she is agreeable, I recommend neurosurgical consult.  Please enter referral to neurosurgery for indication of worsening headaches or recurrent headaches and Chiari malformation with progression.

## 2020-12-04 NOTE — Progress Notes (Signed)
Please call patient regarding her brain MRI.  Compared to 2006, her Chiari malformation has progressed meaning that the lower portion of the cerebellum (small brain in the back) extends into the beginning of the spinal canal.  This has become worse compared to 2006.  I recommend evaluation with a neurosurgeon to see if surgery is indicated.  Surgical decompression may help with her headaches.  If she is agreeable, I recommend neurosurgical consult.  Please enter referral to neurosurgery for indication of worsening headaches or recurrent headaches and Chiari malformation with progression.

## 2020-12-09 ENCOUNTER — Telehealth: Payer: Self-pay

## 2020-12-09 ENCOUNTER — Encounter: Payer: Self-pay | Admitting: Neurology

## 2020-12-09 NOTE — Progress Notes (Signed)
Left patient a message regarding patient assistant applications for Alphagan and Lumigan. Informed patient to return my call.  Georgiana Shore ,[place] Pharmacist Assistant [phone]

## 2020-12-11 DIAGNOSIS — H401122 Primary open-angle glaucoma, left eye, moderate stage: Secondary | ICD-10-CM | POA: Diagnosis not present

## 2020-12-11 DIAGNOSIS — H04123 Dry eye syndrome of bilateral lacrimal glands: Secondary | ICD-10-CM | POA: Diagnosis not present

## 2020-12-11 DIAGNOSIS — H401112 Primary open-angle glaucoma, right eye, moderate stage: Secondary | ICD-10-CM | POA: Diagnosis not present

## 2020-12-12 ENCOUNTER — Ambulatory Visit: Payer: Medicare HMO | Admitting: Internal Medicine

## 2020-12-12 ENCOUNTER — Telehealth: Payer: Self-pay | Admitting: Neurology

## 2020-12-12 DIAGNOSIS — G4734 Idiopathic sleep related nonobstructive alveolar hypoventilation: Secondary | ICD-10-CM

## 2020-12-12 DIAGNOSIS — R7981 Abnormal blood-gas level: Secondary | ICD-10-CM

## 2020-12-12 DIAGNOSIS — R519 Headache, unspecified: Secondary | ICD-10-CM

## 2020-12-12 DIAGNOSIS — G4733 Obstructive sleep apnea (adult) (pediatric): Secondary | ICD-10-CM

## 2020-12-12 NOTE — Telephone Encounter (Signed)
I reviewed patient's overnight pulse oximetry test results from 12/09/2020.  The patient was on her CPAP of 10 cm for the night.  Total duration of test time was 8 hours and 17 minutes, average oxygen saturation was 90%, nadir was 83%, time below or at 88% saturation was 112 minutes.  Time below or at 89% saturation was 3 hours and 15 minutes.  Please call patient and advise her that her oxygen test showed abnormal oxygen saturations, she has an adequate apnea control on her CPAP of 10 cm.  At this juncture, I recommend that she be evaluated by a lung specialist for the possibility of an underlying lung problem that could account for lower oxygen saturations at night which can then also tie in with her recurrent headaches.  If she is agreeable, I will place a referral to pulmonology.

## 2020-12-13 ENCOUNTER — Telehealth: Payer: Self-pay | Admitting: Family Medicine

## 2020-12-13 ENCOUNTER — Ambulatory Visit
Admission: RE | Admit: 2020-12-13 | Discharge: 2020-12-13 | Disposition: A | Discharge status: Home / Self Care | Payer: Medicare HMO | Source: Ambulatory Visit | Attending: Neurosurgery | Admitting: Neurosurgery

## 2020-12-13 ENCOUNTER — Telehealth: Payer: Self-pay

## 2020-12-13 ENCOUNTER — Other Ambulatory Visit: Payer: Self-pay

## 2020-12-13 DIAGNOSIS — M4722 Other spondylosis with radiculopathy, cervical region: Secondary | ICD-10-CM | POA: Diagnosis not present

## 2020-12-13 DIAGNOSIS — G959 Disease of spinal cord, unspecified: Secondary | ICD-10-CM

## 2020-12-13 DIAGNOSIS — M5116 Intervertebral disc disorders with radiculopathy, lumbar region: Secondary | ICD-10-CM | POA: Diagnosis not present

## 2020-12-13 DIAGNOSIS — M50123 Cervical disc disorder at C6-C7 level with radiculopathy: Secondary | ICD-10-CM | POA: Diagnosis not present

## 2020-12-13 DIAGNOSIS — M5416 Radiculopathy, lumbar region: Secondary | ICD-10-CM

## 2020-12-13 DIAGNOSIS — M50121 Cervical disc disorder at C4-C5 level with radiculopathy: Secondary | ICD-10-CM | POA: Diagnosis not present

## 2020-12-13 DIAGNOSIS — M4802 Spinal stenosis, cervical region: Secondary | ICD-10-CM | POA: Diagnosis not present

## 2020-12-13 DIAGNOSIS — M48061 Spinal stenosis, lumbar region without neurogenic claudication: Secondary | ICD-10-CM | POA: Diagnosis not present

## 2020-12-13 DIAGNOSIS — M5117 Intervertebral disc disorders with radiculopathy, lumbosacral region: Secondary | ICD-10-CM | POA: Diagnosis not present

## 2020-12-13 MED ORDER — GADOBENATE DIMEGLUMINE 529 MG/ML IV SOLN
20.0000 mL | Freq: Once | INTRAVENOUS | Status: AC | PRN
  Administered 2020-12-13: 20 mL via INTRAVENOUS

## 2020-12-13 NOTE — Telephone Encounter (Signed)
Please call pt and get more information as there aren't any questions or concerns listed

## 2020-12-13 NOTE — Telephone Encounter (Signed)
Called patient in referen to her back pain. Pt did not answer. Left a vm to return call to office to give [place] details about her back pain concerns.

## 2020-12-13 NOTE — Telephone Encounter (Signed)
Her MRI was done and she needs something for pain. This stemed from an old injury 20 yrs ago L5S1 thinning disc.

## 2020-12-13 NOTE — Telephone Encounter (Signed)
Patient would like to speak to you about the pain she is experiencing in her back.  She has an appointment for an MRI this afternoon - but the back doctor told her to contact her PCP

## 2020-12-13 NOTE — Telephone Encounter (Signed)
Called pt and she did not answer. Left vm to return call to office to give details on her back pain.

## 2020-12-13 NOTE — Telephone Encounter (Signed)
Methocarbamol and naproxen make her sick.

## 2020-12-16 ENCOUNTER — Ambulatory Visit: Payer: Medicare HMO | Admitting: Adult Health

## 2020-12-16 NOTE — Telephone Encounter (Addendum)
I reached out to the pt and advised of results. Pt reports at this time she would like hold of on pursuing the lung specialist. She sts she has upcoming appts with her PCP and neurosurgeon and she would like to review with them and then decide on lung specialist referral. Pt will call back if she would like referral to be placed.

## 2020-12-16 NOTE — Telephone Encounter (Signed)
Referral to pulmonology placed.

## 2020-12-16 NOTE — Telephone Encounter (Signed)
Okay, sounds good.  Definitely can talk to primary care physician about pulmonary referral.

## 2020-12-16 NOTE — Addendum Note (Signed)
Addended by: Star Age on: 12/16/2020 04:28 PM   Modules accepted: Orders

## 2020-12-16 NOTE — Telephone Encounter (Signed)
Referral has been sent to pulmonary Care thanks [facility]

## 2020-12-16 NOTE — Telephone Encounter (Signed)
Pt called back. She sts she has had time to think about the Lung Specialist and would like for our office to place the referral.  Will fwd to MD

## 2020-12-16 NOTE — Telephone Encounter (Signed)
Pt called, would like to see Dr. Brand Males at [facility]. Would like a call back from the nurse.

## 2020-12-16 NOTE — Telephone Encounter (Signed)
I called pt. No answer, left a message asking pt to call me back.   

## 2020-12-17 ENCOUNTER — Encounter: Payer: Self-pay | Admitting: Family Medicine

## 2020-12-17 ENCOUNTER — Other Ambulatory Visit: Payer: Self-pay

## 2020-12-17 ENCOUNTER — Ambulatory Visit (INDEPENDENT_AMBULATORY_CARE_PROVIDER_SITE_OTHER): Payer: Medicare HMO | Admitting: Family Medicine

## 2020-12-17 VITALS — BP 120/80 | HR 68 | Temp 99.1°F | Resp 17 | Ht 68.0 in | Wt 223.6 lb

## 2020-12-17 DIAGNOSIS — M5127 Other intervertebral disc displacement, lumbosacral region: Secondary | ICD-10-CM

## 2020-12-17 DIAGNOSIS — L719 Rosacea, unspecified: Secondary | ICD-10-CM

## 2020-12-17 DIAGNOSIS — G4734 Idiopathic sleep related nonobstructive alveolar hypoventilation: Secondary | ICD-10-CM | POA: Diagnosis not present

## 2020-12-17 MED ORDER — HYDROCODONE-ACETAMINOPHEN 5-325 MG PO TABS: 1.0000 | ORAL_TABLET | Freq: Four times a day (QID) | ORAL | 0 refills | Status: DC | PRN

## 2020-12-17 MED ORDER — MELOXICAM 15 MG PO TABS: 15.0000 mg | ORAL_TABLET | Freq: Every day | ORAL | 1 refills | Status: DC

## 2020-12-17 NOTE — Patient Instructions (Signed)
Follow up w/ Dr Annette Stable as scheduled START the Meloxicam once daily- take w/ food AVOID Naproxen/Ibuprofen/Aleve etc USE the Hydrocodone as needed for severe pain I agree w/ a pulmonary referral to assess your low oxygen levels at night We will call you with your dermatology appt Call with any questions or concerns Hang in there!

## 2020-12-17 NOTE — Telephone Encounter (Signed)
Patient is scheduled today at 9:30

## 2020-12-17 NOTE — Telephone Encounter (Signed)
Please ask pt if her neurosurgery group has provided any medication for pain relief as they are the ones treating this condition

## 2020-12-17 NOTE — Progress Notes (Signed)
   Subjective:    Patient ID: [name], female    DOB: Jan 10, 1956, 65 y.o.   MRN: [id number]  HPI LBP- pt reports she injured back ~3 months.  Was seeing a chiropractor 'but I think he messed me up'.  Then went to see Dr Annette Stable who recommended MRI.  She has had MRI of cervical spine and Lspine.  She follows up with them on 2/16.  Pt has taken ibuprofen w/o relief.  Pain is predominately over L5-S1 on L and radiates down posterior thigh and into lower leg.  Has not been on tramadol since 2016- no hx of seizures.  OSA- pt had home sleep study.  Apnea was well controlled on the CPAP but she had decreased O2 levels and spent 3 hrs and 15 minutes w/ O2 sats <89%  Neuro wants her to see a lung specialist.  Referral has been placed  Rosacea/Acne- pt is using metronidazole and retinoid but face is not improving.  Saw Dr Pablo Ledger at [place] see HPI   This visit occurred during the SARS-CoV-2 public health emergency.  Safety protocols were in place, including screening questions prior to the visit, additional usage of staff PPE, and extensive cleaning of exam room while observing appropriate contact time as indicated for disinfecting solutions.       Objective:   Physical Exam Vitals reviewed.  Constitutional:      General: She is not in acute distress.    Appearance: Normal appearance. She is obese. She is not ill-appearing.     Comments: Obviously uncomfortable- prefers to stand  HENT:     Head: Normocephalic and atraumatic.  Pulmonary:     Effort: Pulmonary effort is normal.     Breath sounds: Normal breath sounds.  Skin:    General: Skin is warm and dry.     Comments: Scattered closed comedones on lower face  Neurological:     General: No focal deficit present.     Mental Status: She is alert and oriented to person, place, and time.  Psychiatric:        Mood and Affect: Mood normal.        Behavior: Behavior normal.        Thought Content: Thought  content normal.           Assessment & Plan:  Herniated L5/S1- pt has upcoming appt w/ Dr Annette Stable after having her MRIs.  Unfortunately he can't see her until 2/16 and her pain is considerable.  She is very aware of controlled substances and the risks involved and she will start w/ a 1/2 tab of hydrocodone as needed in addition to starting Meloxicam daily.  PDMP reviewed.  Will follow  Sleep related hypoxia- new.  Reviewed Dr Michaelle Birks notes and agree w/ pulmonary referral.  Discussed this w/ patient in attempt to ease anxiety.  She is agreeable to referral and will follow up w/ pulmonary when scheduled.  Rosacea- ongoing issue.  Pt was seeing Dr Pablo Ledger but does not feel that things are improving, despite medication.  No f/u was scheduled and she would like to see someone new.  Referral placed to Dr Particia Nearing.  Pt expressed understanding and is in agreement w/ plan.

## 2020-12-23 DIAGNOSIS — R69 Illness, unspecified: Secondary | ICD-10-CM | POA: Diagnosis not present

## 2020-12-23 NOTE — Telephone Encounter (Signed)
Noted resent referral to [place].  Sent via Epic to [employer] to Dr. Chase Caller, [facility] Thanks Jayme Cloud

## 2020-12-25 ENCOUNTER — Other Ambulatory Visit: Payer: Self-pay

## 2020-12-25 DIAGNOSIS — R030 Elevated blood-pressure reading, without diagnosis of hypertension: Secondary | ICD-10-CM | POA: Diagnosis not present

## 2020-12-25 DIAGNOSIS — Z6834 Body mass index (BMI) 34.0-34.9, adult: Secondary | ICD-10-CM | POA: Diagnosis not present

## 2020-12-25 DIAGNOSIS — G959 Disease of spinal cord, unspecified: Secondary | ICD-10-CM | POA: Diagnosis not present

## 2020-12-25 DIAGNOSIS — G4733 Obstructive sleep apnea (adult) (pediatric): Secondary | ICD-10-CM | POA: Diagnosis not present

## 2020-12-25 MED ORDER — PRALUENT 150 MG/ML ~~LOC~~ SOAJ: 150.0000 mg | SUBCUTANEOUS | 3 refills | Status: DC

## 2020-12-30 DIAGNOSIS — L700 Acne vulgaris: Secondary | ICD-10-CM | POA: Diagnosis not present

## 2020-12-31 IMAGING — CT CT CERVICAL SPINE W/O CM
3 of 4 series · 14 of 33 positions shown, 17 images · non-contrast
Comparison: Maxillofacial exam of 8731. CT of the head of
08/20/2008.

CLINICAL DATA: Fall in bathtub, neck pain

EXAM:
CT HEAD WITHOUT CONTRAST
CT CERVICAL SPINE WITHOUT CONTRAST
TECHNIQUE: Multidetector CT imaging of the head and cervical spine was
performed following the standard protocol without intravenous
contrast. Multiplanar CT image reconstructions of the cervical spine
were also generated.

[Series 4: orthogonal bone · axial · 0.30mm/px · z∈[+1058,+1201]mm · 6 of 114 slices shown, 8 images]
[im 17/114  soft-tissue]
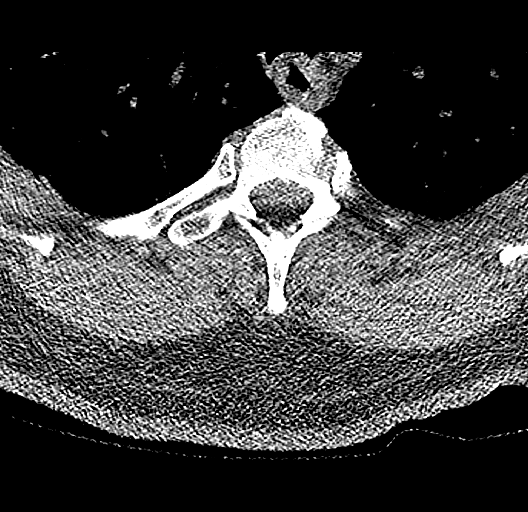
[im 17/114  bone]
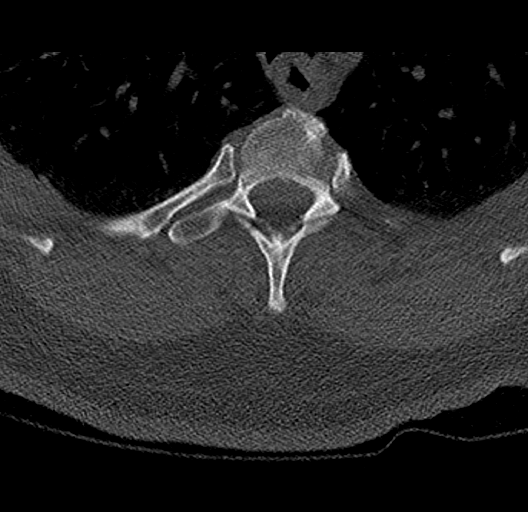
[im 33/114  bone]
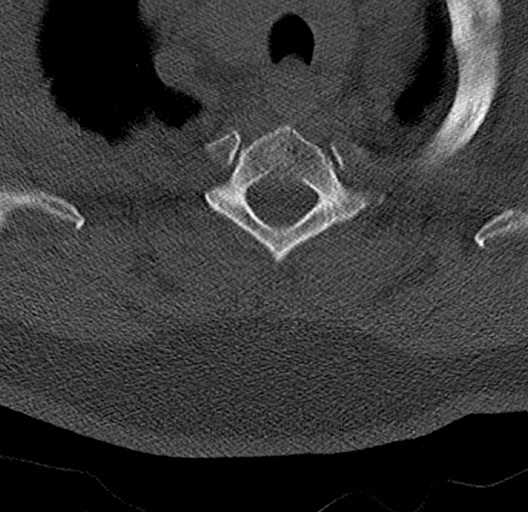
[im 49/114  bone]
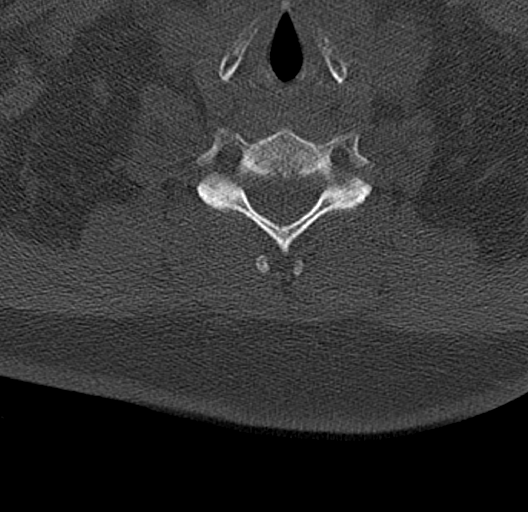
[im 65/114  bone]
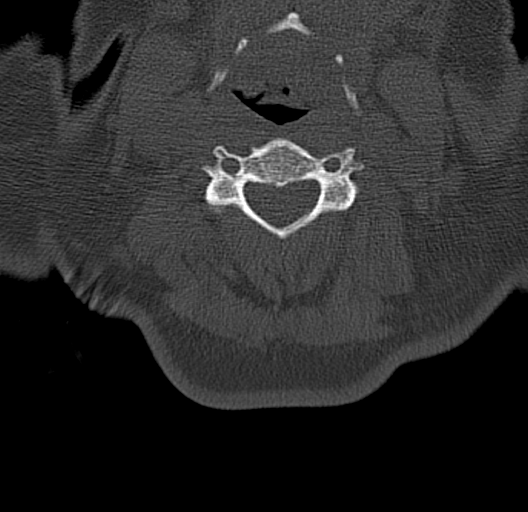
[im 81/114  soft-tissue]
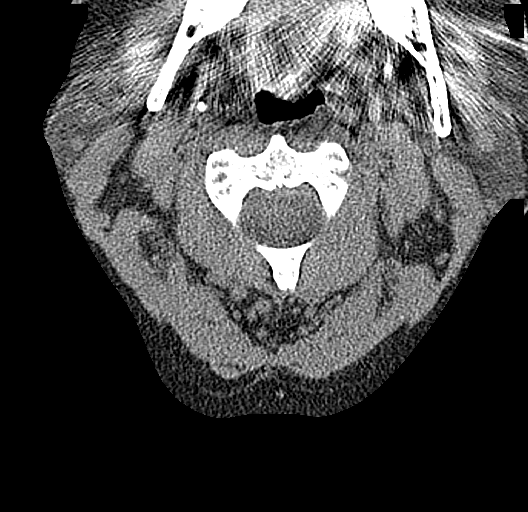
[im 81/114  bone]
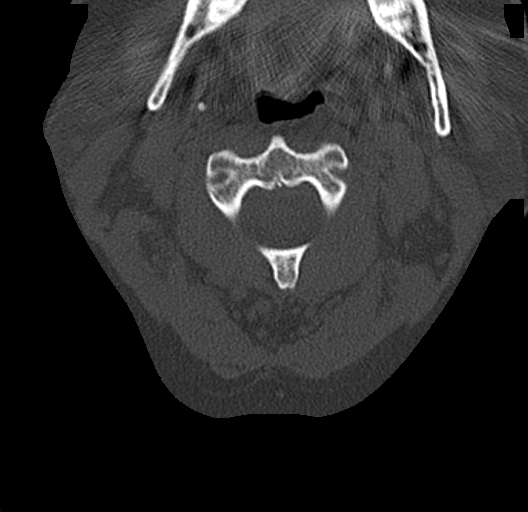
[im 97/114  bone]
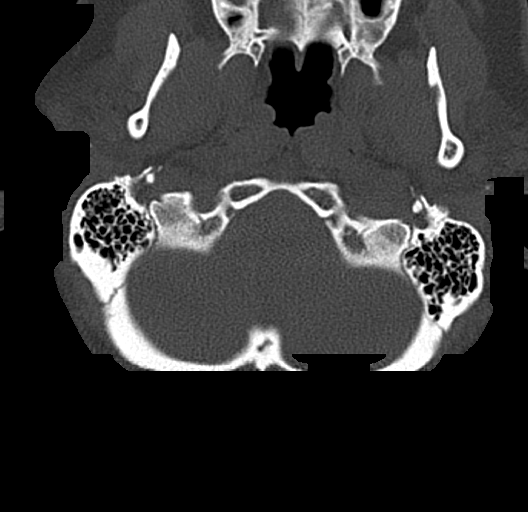

[Series 5: coronal bone · coronal · 0.30mm/px · 3 of 48 slices shown]
[im 10/48  bone]
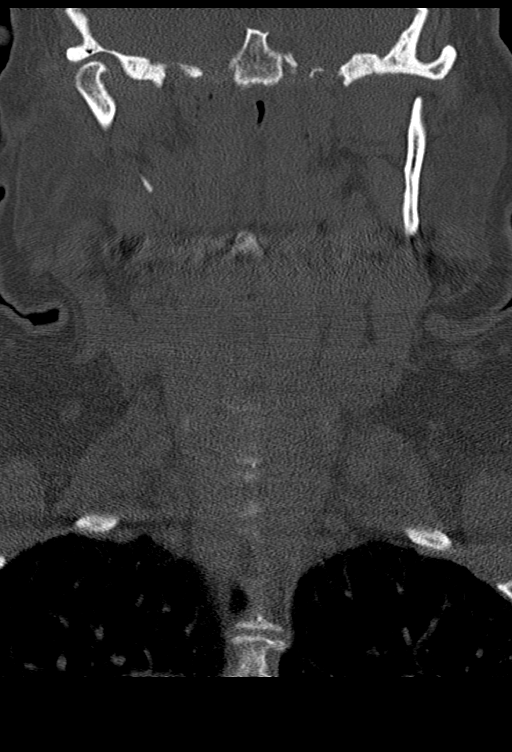
[im 19/48  bone]
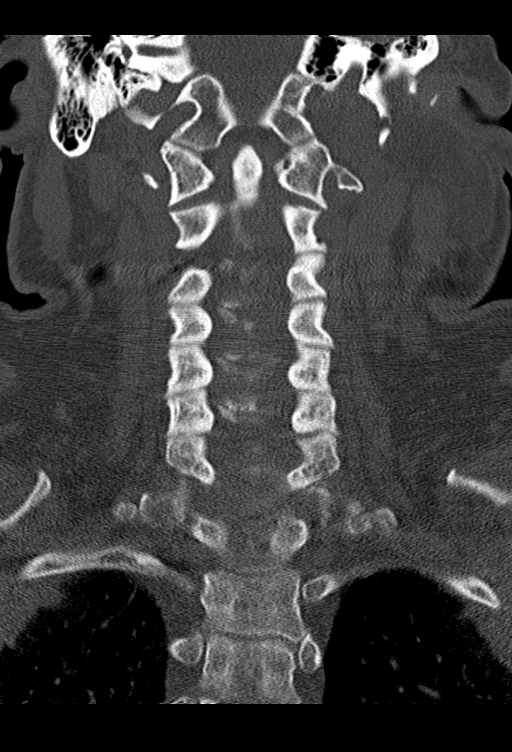
[im 29/48  bone]
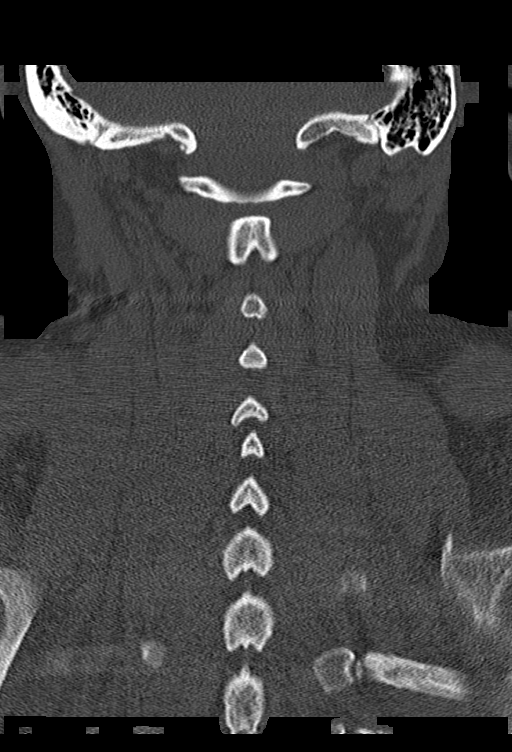

[Series 6: sagittal bone · sagittal · 0.36mm/px · 5 of 61 slices shown, 6 images]
[im 21/61  bone]
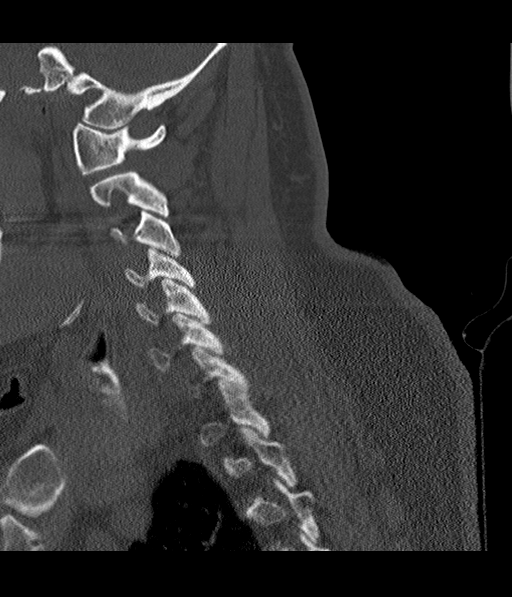
[im 26/61  bone]
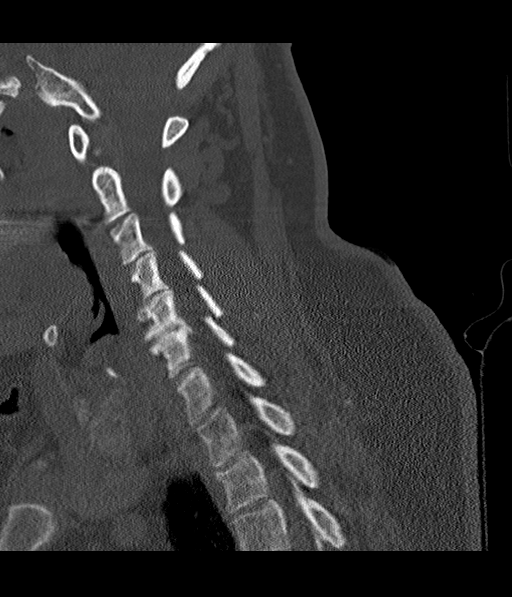
[im 31/61  soft-tissue]
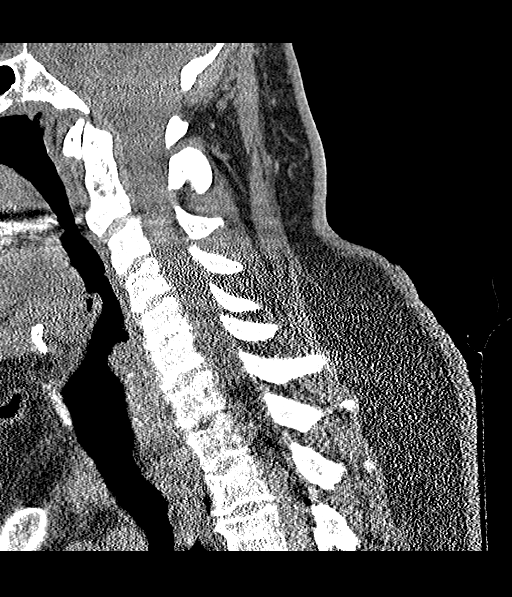
[im 31/61  bone]
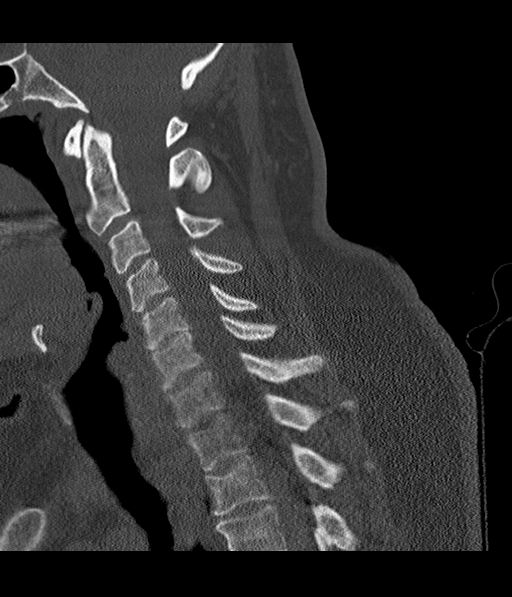
[im 36/61  bone]
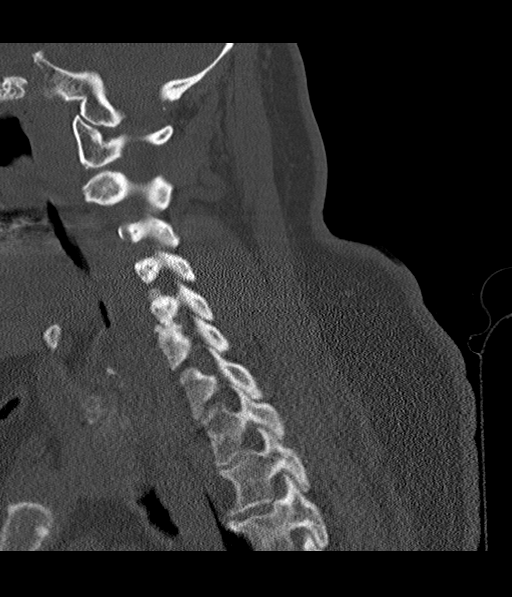
[im 41/61  bone]
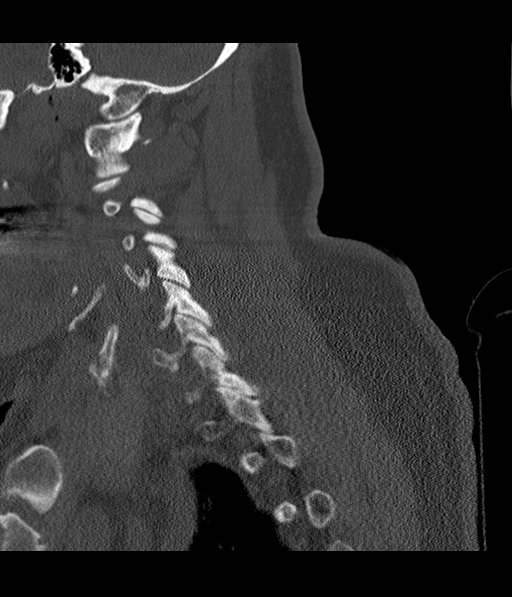

[14 of 33 positions shown; findings below may reference images not displayed]

FINDINGS: CT HEAD FINDINGS

Brain: No evidence of acute infarction, hemorrhage, hydrocephalus,
extra-axial collection or mass lesion/mass effect. Signs of Chiari 1
malformation similar to prior study.

Vascular: No hyperdense vessel or unexpected calcification.

Skull: No signs of acute bone process or destructive bone lesion. No
signs of skull fracture.

Sinuses/Orbits: No acute finding.

Other: None.

CT CERVICAL SPINE FINDINGS

Alignment: Mild straightening of normal cervical lordosis. No signs
of fracture or subluxation.

Skull base and vertebrae: No acute fracture. No primary bone lesion
or focal pathologic process.

Soft tissues and spinal canal: No prevertebral fluid or swelling. No
visible canal hematoma.

Disc levels: Mild-to-moderate degenerative changes greatest at
C5-C6.

Upper chest: Negative.

Other: No signs of adenopathy or suspicious finding in the soft
tissues.
IMPRESSION: 1. Signs of Chiari 1 malformation similar to prior studies without
acute intracranial findings.
2. No signs of acute fracture or subluxation involving the cervical
spine.

## 2020-12-31 IMAGING — CT CT HEAD W/O CM
3 series · 14 of 47 positions shown, 16 images · non-contrast
Comparison: Maxillofacial exam of 8731. CT of the head of
08/20/2008.

CLINICAL DATA: Fall in bathtub, neck pain

EXAM:
CT HEAD WITHOUT CONTRAST
CT CERVICAL SPINE WITHOUT CONTRAST
TECHNIQUE: Multidetector CT imaging of the head and cervical spine was
performed following the standard protocol without intravenous
contrast. Multiplanar CT image reconstructions of the cervical spine
were also generated.

[Series 2: head wo · axial · 0.47mm/px · z∈[+1234,+1359]mm · 8 of 30 slices shown, 10 images]
[im 3/30  brain]
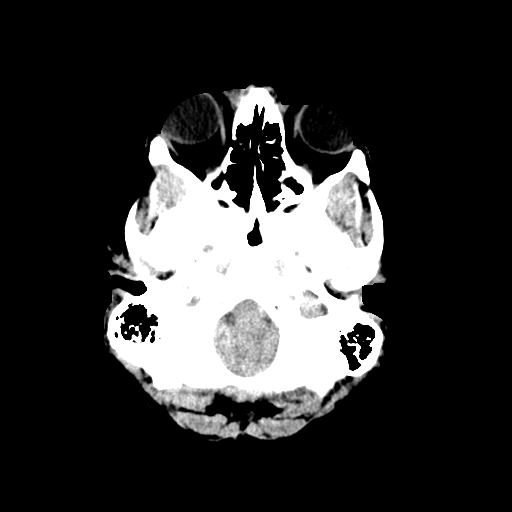
[im 3/30  bone]
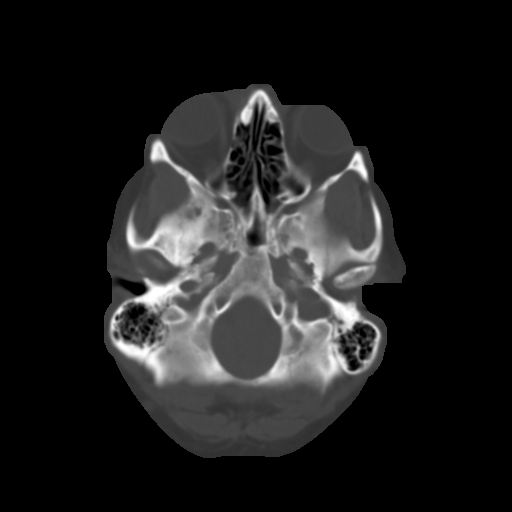
[im 7/30  brain]
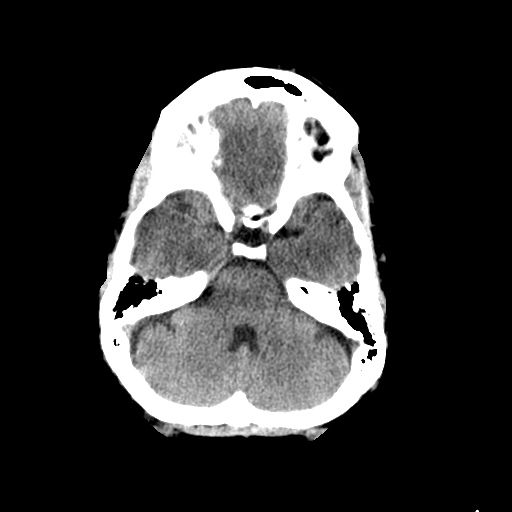
[im 10/30  brain]
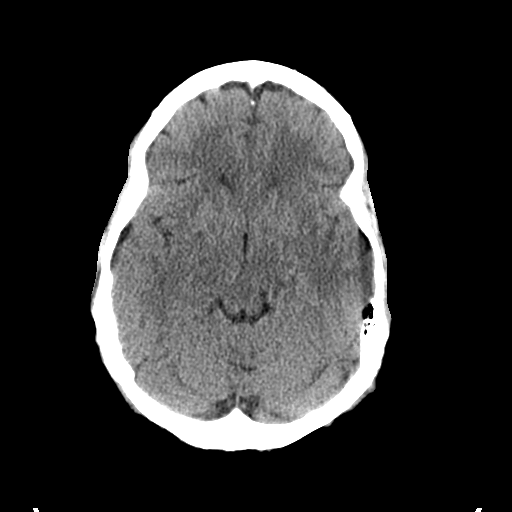
[im 14/30  brain]
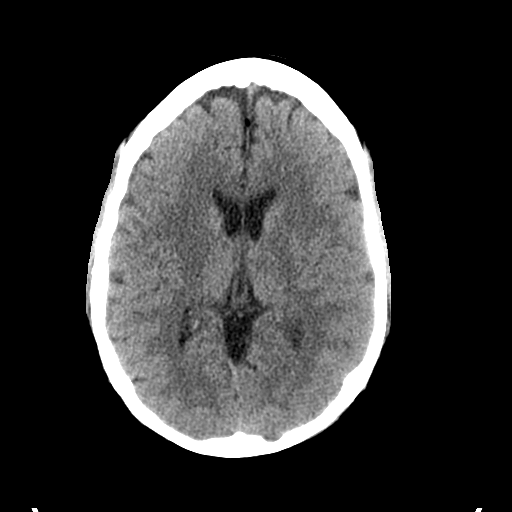
[im 17/30  brain]
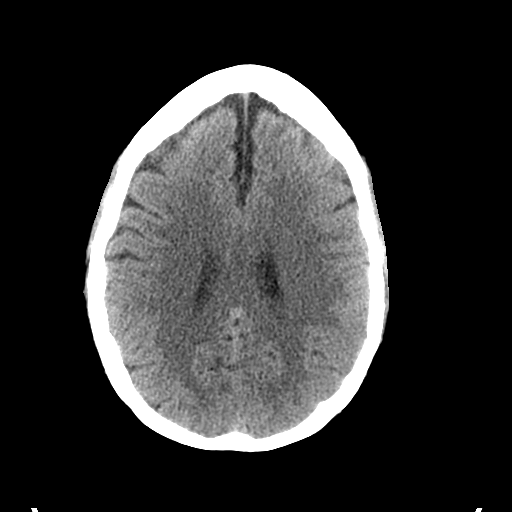
[im 17/30  bone]
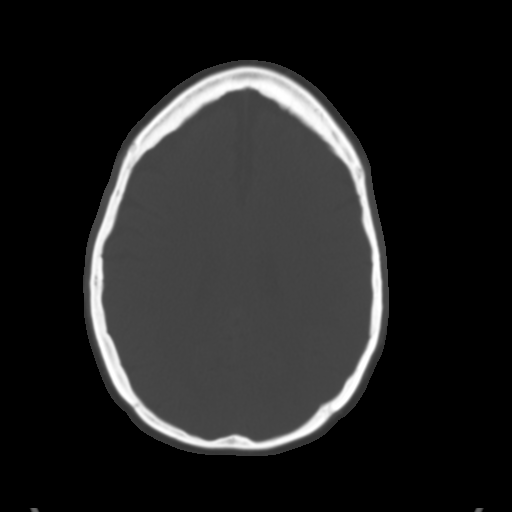
[im 21/30  brain]
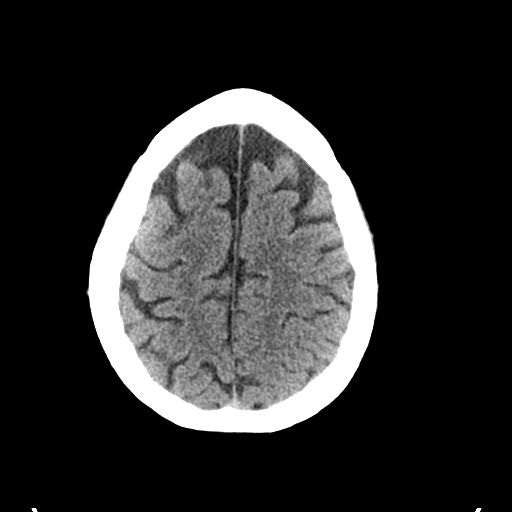
[im 24/30  brain]
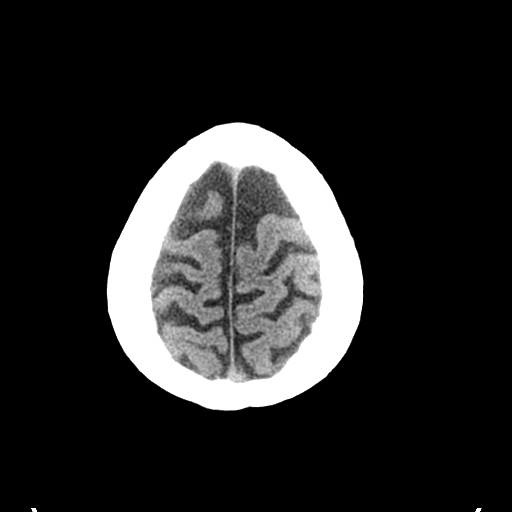
[im 28/30  brain]
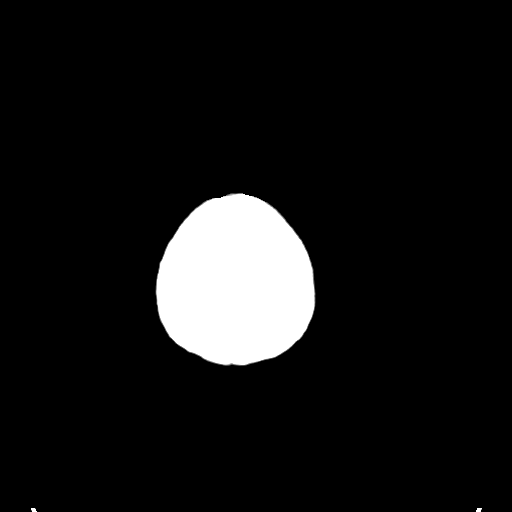

[Series 4: coronal soft tissue · coronal · 0.37mm/px · 3 of 64 slices shown]
[im 22/64  brain]
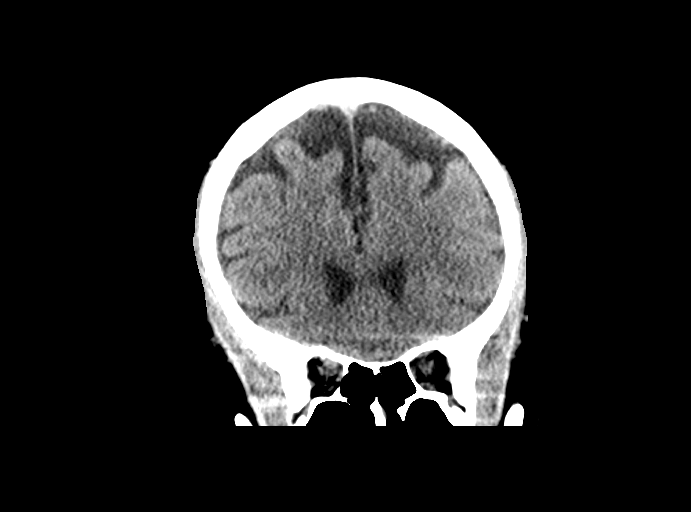
[im 29/64  brain]
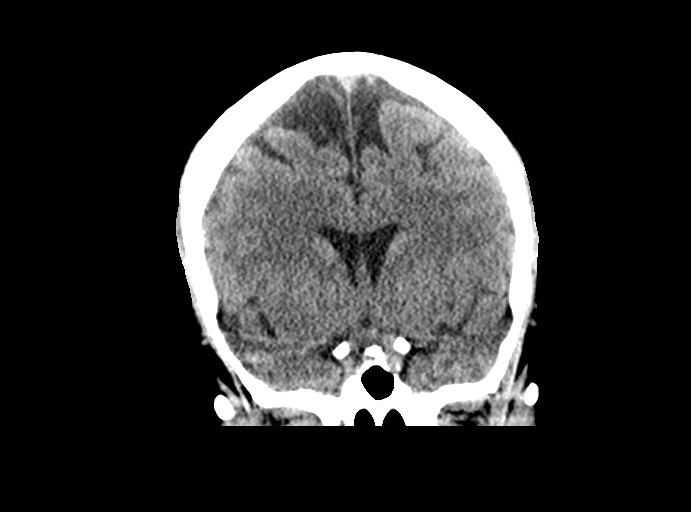
[im 36/64  brain]
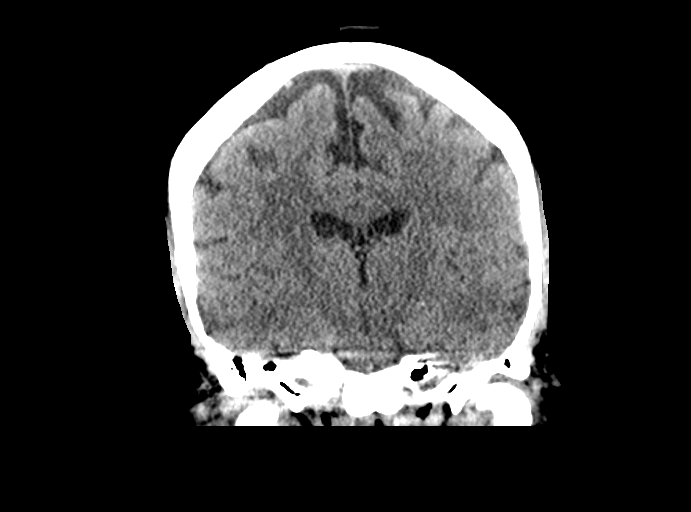

[Series 5: sagittal soft tissue · sagittal · 0.38mm/px · 3 of 50 slices shown]
[im 17/50  brain]
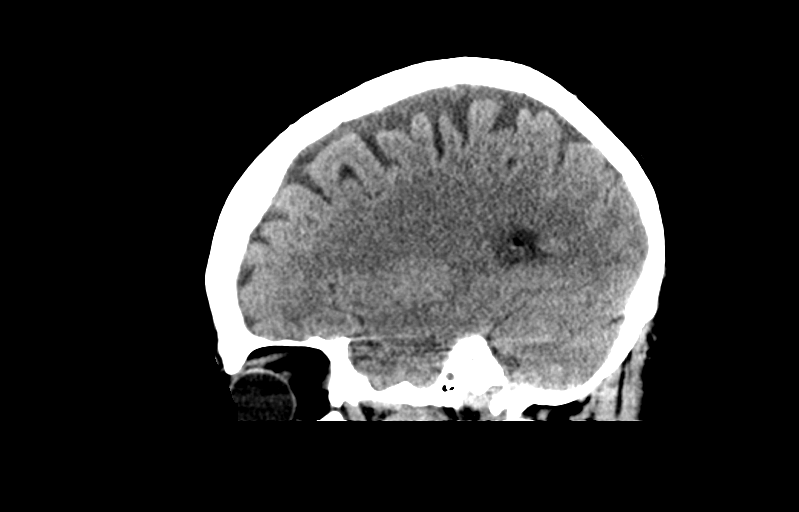
[im 25/50  brain]
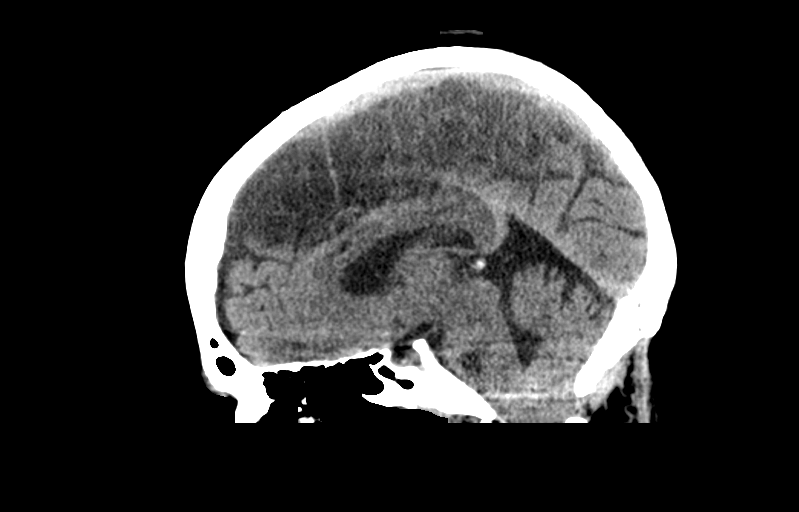
[im 33/50  brain]
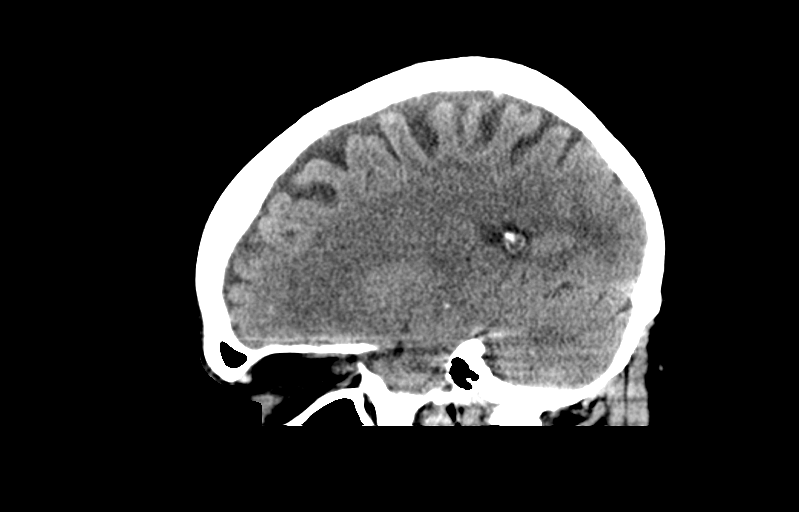

[14 of 47 positions shown; findings below may reference images not displayed]

FINDINGS: CT HEAD FINDINGS

Brain: No evidence of acute infarction, hemorrhage, hydrocephalus,
extra-axial collection or mass lesion/mass effect. Signs of Chiari 1
malformation similar to prior study.

Vascular: No hyperdense vessel or unexpected calcification.

Skull: No signs of acute bone process or destructive bone lesion. No
signs of skull fracture.

Sinuses/Orbits: No acute finding.

Other: None.

CT CERVICAL SPINE FINDINGS

Alignment: Mild straightening of normal cervical lordosis. No signs
of fracture or subluxation.

Skull base and vertebrae: No acute fracture. No primary bone lesion
or focal pathologic process.

Soft tissues and spinal canal: No prevertebral fluid or swelling. No
visible canal hematoma.

Disc levels: Mild-to-moderate degenerative changes greatest at
C5-C6.

Upper chest: Negative.

Other: No signs of adenopathy or suspicious finding in the soft
tissues.
IMPRESSION: 1. Signs of Chiari 1 malformation similar to prior studies without
acute intracranial findings.
2. No signs of acute fracture or subluxation involving the cervical
spine.

## 2021-01-02 DIAGNOSIS — G4733 Obstructive sleep apnea (adult) (pediatric): Secondary | ICD-10-CM | POA: Diagnosis not present

## 2021-01-02 DIAGNOSIS — R69 Illness, unspecified: Secondary | ICD-10-CM | POA: Diagnosis not present

## 2021-01-03 ENCOUNTER — Telehealth: Payer: Self-pay

## 2021-01-03 NOTE — Telephone Encounter (Signed)
Patient called and said that there was an error with her [place] card ID #.  I spoke to her and said that you may reach out to her on Monday.  She verbalized understanding.

## 2021-01-06 NOTE — Telephone Encounter (Signed)
lmom for pt to call back

## 2021-01-06 NOTE — Telephone Encounter (Signed)
Called and spoke to pharmacy giving them [employer] grant information and they voiced that the cost is $0

## 2021-01-06 NOTE — Progress Notes (Signed)
[facility] [address] [place] [place] [address] [phone]       OFFICE FOLLOW UP NOTE  [name] Date of Birth:  18-Sep-1956 Medical Record Number:  [id number]   Reason for visit: CPAP compliance visit    SUBJECTIVE:   CHIEF COMPLAINT:  Chief Complaint  Patient presents with  . Follow-up    RM 14 alone PT is well, sleeps good CPAP helps    HPI:   Today, 01/06/2021, [name] returns for 6 months CPAP compliance visit    Review of compliance report shows excellent compliance and adequate residual AHI at 3.0.  ESS: 7 (prior 4) FSS: 24 (prior 35)   c/o headaches waking her in the middle the night therefore underwent ONO which showed time below or at 89% saturation was 3 hours and 15 minutes. Dr. Rexene Alberts recommended further evaluation by pulmonology -she has been attempting to contact pulmonology office to schedule visit.   She will occasionally continue to wake up with headaches but she also questions if these could be in setting of increased stressors.  She was seen by Dr. Annette Stable as requested by Dr. Rexene Alberts for chiari malformation which progressed since prior imaging in 2006 but per patient, he did not have any concerns regarding this malformation but per review of Dr. Marchelle Folks recent OV note, this malformation was not mentioned.  Plans on follow-up in 3 months and will discuss further at that time.    ROS:   14 system review of systems performed and negative with exception of see HPI  PMH:  Past Medical History:  Diagnosis Date  . Allergic rhinitis   . Anxiety   . Arthritis    R knee, Left Knee  . Asthma   . Bipolar disorder ([place])   . Cataract   . Complication of anesthesia    had bad anxiety after anesthesia  . Constipation, chronic   . Depression   . Diabetes mellitus without complication ([place])   . Dysrhythmia    Palpations occ, takes Tenormin  . GERD (gastroesophageal reflux disease)   . Heart murmur   . Hyperlipidemia   .  Hypertension   . MVP (mitral valve prolapse)   . Urinary tract infection    frequent, see Urololgist 12/16/2010    PSH:  Past Surgical History:  Procedure Laterality Date  . ABDOMINAL HYSTERECTOMY  2002   no oophorectomy  . BACK SURGERY  2001   L5 - S1  . CATARACT EXTRACTION    . COLONOSCOPY    . NASAL SINUS SURGERY  01/2010   x2  . TONSILLECTOMY    . TOTAL KNEE ARTHROPLASTY  12/23/2011   Procedure: TOTAL KNEE ARTHROPLASTY;  Surgeon: Ninetta Lights, MD;  Location: [place];  Service: Orthopedics;  Laterality: Right;  DR MURPHY WANTS 120 MINUTES FOR SURGERY  . TOTAL KNEE ARTHROPLASTY  08/31/2012   Procedure: TOTAL KNEE ARTHROPLASTY;  Surgeon: Ninetta Lights, MD;  Location: [place];  Service: Orthopedics;  Laterality: Left;    Social History:  Social History   Socioeconomic History  . Marital status: Single    Spouse name: Not on file  . Number of children: 0  . Years of education: 4  . Highest education level: Not on file  Occupational History  . Occupation: retired//disability  Tobacco Use  . Smoking status: Never Smoker  . Smokeless tobacco: Never Used  Vaping Use  . Vaping Use: Never used  Substance and Sexual Activity  . Alcohol use:  No    Alcohol/week: 0.0 standard drinks    Comment: Occasional glass of wine/"hardly ever"  . Drug use: No  . Sexual activity: Not Currently    Birth control/protection: Abstinence  Other Topics Concern  . Not on file  Social History Narrative   Lives alone with 2 cats. Has pet sitting business.   2 cups of coffee a day, Rare soda    Social Determinants of Health   Financial Resource Strain: Low Risk   . Difficulty of Paying Living Expenses: Not very hard  Food Insecurity: No Food Insecurity  . Worried About Charity fundraiser in the Last Year: Never true  . Ran Out of Food in the Last Year: Never true  Transportation Needs: No Transportation Needs  . Lack of Transportation (Medical): No  . Lack of Transportation (Non-Medical):  No  Physical Activity: Sufficiently Active  . Days of Exercise per Week: 7 days  . Minutes of Exercise per Session: 30 min  Stress: No Stress Concern Present  . Feeling of Stress : Only a little  Social Connections: Socially Isolated  . Frequency of Communication with Friends and Family: More than three times a week  . Frequency of Social Gatherings with Friends and Family: Once a week  . Attends Religious Services: Never  . Active Member of Clubs or Organizations: No  . Attends Archivist Meetings: Never  . Marital Status: Divorced  Human resources officer Violence: Not At Risk  . Fear of Current or Ex-Partner: No  . Emotionally Abused: No  . Physically Abused: No  . Sexually Abused: No    Family History:  Family History  Problem Relation Age of Onset  . Coronary artery disease Father   . Heart attack Father 34  . Heart disease Father   . Lung disease Mother        Mycobacterium avium intracellulare  . Hypertension Brother   . Hyperlipidemia Brother   . Anesthesia problems Neg Hx     Medications:   Current Outpatient Medications on File Prior to Visit  Medication Sig Dispense Refill  . Alirocumab (PRALUENT) 150 MG/ML SOAJ Inject 150 mg into the skin every 14 (fourteen) days. 6 mL 3  . ALPHAGAN P 0.1 % SOLN Place 1 drop into both eyes every 8 (eight) hours.     Marland Kitchen azelastine (ASTELIN) 0.1 % nasal spray PLACE 2 SPRAYS INTO EACH NOSTRIL TWO TIMES A DAY AS DIRECTED 30 mL 2  . clonazePAM (KLONOPIN) 2 MG tablet 1 mg 2 (two) times daily.   0  . Cyanocobalamin (VITAMIN B-12 PO) Take by mouth.    . fenofibrate (TRICOR) 145 MG tablet TAKE ONE TABLET BY MOUTH DAILY 30 tablet 3  . fexofenadine (ALLEGRA) 180 MG tablet Take 180 mg by mouth daily.    . fluticasone (FLONASE) 50 MCG/ACT nasal spray SPRAY TWO SPRAYS IN EACH NOSTRIL ONCE DAILY 16 mL 1  . fluticasone (FLOVENT HFA) 110 MCG/ACT inhaler Inhale 2 puffs into the lungs in the morning and at bedtime. 1 each 6  . gabapentin  (NEURONTIN) 600 MG tablet Take 1,200 mg by mouth 2 (two) times daily.     Marland Kitchen HYDROcodone-acetaminophen (NORCO/VICODIN) 5-325 MG tablet Take 1 tablet by mouth every 6 (six) hours as needed for moderate pain. 20 tablet 0  . lamoTRIgine (LAMICTAL) 200 MG tablet Take 200 mg by mouth 2 (two) times daily.    Marland Kitchen LUMIGAN 0.01 % SOLN Place 1 drop into both eyes at bedtime.     Marland Kitchen  meloxicam (MOBIC) 15 MG tablet Take 1 tablet (15 mg total) by mouth daily. 30 tablet 1  . methocarbamol (ROBAXIN) 500 MG tablet Take 1 tablet (500 mg total) by mouth every 8 (eight) hours as needed. 15 tablet 0  . metoprolol succinate (TOPROL-XL) 50 MG 24 hr tablet TAKE ONE TABLET BY MOUTH DAILY WITH A MEAL 90 tablet 2  . montelukast (SINGULAIR) 10 MG tablet Take 1 tablet (10 mg total) by mouth at bedtime. 90 tablet 1  . Multiple Vitamin (MULTIVITAMIN WITH MINERALS) TABS tablet Take 1 tablet by mouth daily.     Marland Kitchen OLANZapine (ZYPREXA) 10 MG tablet Take 10 mg by mouth daily as needed.    Marland Kitchen omega-3 acid ethyl esters (LOVAZA) 1 g capsule Take 2 capsules (2 g total) by mouth 2 (two) times daily. 360 capsule 3  . ondansetron (ZOFRAN ODT) 4 MG disintegrating tablet Take 1 tablet (4 mg total) by mouth every 8 (eight) hours as needed for nausea or vomiting. 20 tablet 0  . Pyridoxine HCl (VITAMIN B-6 PO) Take by mouth.    . QUEtiapine (SEROQUEL XR) 400 MG 24 hr tablet Take one tablet every evening at bedtime  0  . QUEtiapine (SEROQUEL) 50 MG tablet As needed  3  . venlafaxine (EFFEXOR-XR) 75 MG 24 hr capsule Take 225 mg by mouth daily.    . Vitamin D, Ergocalciferol, (DRISDOL) 1.25 MG (50000 UNIT) CAPS capsule Take 1 capsule (50,000 Units total) by mouth every 7 (seven) days. 12 capsule 0   No current facility-administered medications on file prior to visit.    Allergies:   Allergies  Allergen Reactions  . Nitrofurantoin Monohyd Macro Nausea Only    Severe headache  . Statins Other (See Comments)    Severe weakness, pain  . Sulfa  Antibiotics Nausea And Vomiting  . Alprazolam Anxiety and Other (See Comments)    Hyper   . Amoxicillin Rash  . Prednisone Anxiety      OBJECTIVE:  Physical Exam  Vitals:   01/07/21 0950  Weight: 227 lb (103 kg)  Height: 5\' 8"  (1.727 m)   Body mass index is 34.52 kg/m. No exam data present  General: well developed, well nourished,  pleasant middle-age Caucasian female, seated, in no evident distress Head: head normocephalic and atraumatic.   Neck: supple with no carotid or supraclavicular bruits Cardiovascular: regular rate and rhythm, no murmurs Musculoskeletal: no deformity Skin:  no rash/petichiae Vascular:  Normal pulses all extremities   Neurologic Exam Mental Status: Awake and fully alert.   Fluent speech and language.  Oriented to place and time. Recent and remote memory intact. Attention span, concentration and fund of knowledge appropriate. Mood and affect appropriate.  Cranial Nerves: Pupils equal, briskly reactive to light. Extraocular movements full without nystagmus. Visual fields full to confrontation. Hearing intact. Facial sensation intact. Face, tongue, palate moves normally and symmetrically.  Motor: Normal bulk and tone. Normal strength in all tested extremity muscles. Sensory.: intact to touch , pinprick , position and vibratory sensation.  Coordination: Rapid alternating movements normal in all extremities. Finger-to-nose and heel-to-shin performed accurately bilaterally. Gait and Station: Arises from chair without difficulty. Stance is normal. Gait demonstrates normal stride length and balance Reflexes: 1+ and symmetric. Toes downgoing.       ASSESSMENT/PLAN: [name] is a 65 y.o. year old female previously followed in this office by Dr. Rexene Alberts for OSA on CPAP and recently referred back to Dr. Rexene Alberts on 02/13/2020 for reevaluation due to daytime fatigue  and especially sleepy while driving.  It is noted that she is on multiple potentially sedating  medications followed by psychiatry for history of bipolar and anxiety.  Repeat sleep study on 03/11/2020 confirm moderate sleep apnea and prescribed new machine without indication for setting changes. C/o headache awakening her in the middle the night with ONO which showed abnormal oxygen saturations and referred to pulmonology.    Greater than 4-hour compliance 90% with residual AHI 3.0. Continue set pressure of 10 cm H2O and EPR level 3 Continue to follow with DME company [employer] for any supplies or CPAP related concerns  Encouraged patient to contact pulmonology office to schedule initial evaluation -indication and reasoning behind this recommendation by Dr. Rexene Alberts discussed during visit Ensure follow-up with neurosurgery Dr. Annette Stable for chiari malformation which has progressed since 2006 as this may be contributing to headaches    Follow up in 1 year or call earlier if needed   CC:  [place] provider: Dr. Jodi Marble, Aundra Millet, MD    I spent 30 minutes of face-to-face and non-face-to-face time with patient.  This included previsit chart review, lab review, study review, order entry, electronic health record documentation, patient education regarding compliance report for sleep apnea, importance of ongoing use, headaches and undergoing ONO and indication for pulmonology referral as well as follow-up with neurosurgery and answered all other questions to patients satisfaction   [name], AGNP-BC  [facility] [address] [place], [place] [address]  Phone [phone] Fax [phone] Note: This document was prepared with digital dictation and possible smart phrase technology. Any transcriptional errors that result from this process are unintentional.  I reviewed the above note and documentation by the Nurse Practitioner and agree with the history, exam, assessment and plan as outlined above. I was available for consultation. Star Age, MD,  PhD [facility])

## 2021-01-07 ENCOUNTER — Encounter: Payer: Self-pay | Admitting: Adult Health

## 2021-01-07 ENCOUNTER — Ambulatory Visit: Payer: Medicare HMO | Admitting: Adult Health

## 2021-01-07 VITALS — BP 123/87 | HR 80 | Ht 68.0 in | Wt 227.0 lb

## 2021-01-07 DIAGNOSIS — G4733 Obstructive sleep apnea (adult) (pediatric): Secondary | ICD-10-CM | POA: Diagnosis not present

## 2021-01-07 DIAGNOSIS — R519 Headache, unspecified: Secondary | ICD-10-CM | POA: Diagnosis not present

## 2021-01-07 DIAGNOSIS — G4734 Idiopathic sleep related nonobstructive alveolar hypoventilation: Secondary | ICD-10-CM

## 2021-01-07 DIAGNOSIS — Z9989 Dependence on other enabling machines and devices: Secondary | ICD-10-CM

## 2021-01-07 NOTE — Progress Notes (Signed)
I agree with the above plan 

## 2021-01-09 DIAGNOSIS — R69 Illness, unspecified: Secondary | ICD-10-CM | POA: Diagnosis not present

## 2021-01-10 ENCOUNTER — Telehealth: Payer: Self-pay

## 2021-01-10 ENCOUNTER — Telehealth: Payer: Self-pay | Admitting: Family Medicine

## 2021-01-10 ENCOUNTER — Ambulatory Visit (INDEPENDENT_AMBULATORY_CARE_PROVIDER_SITE_OTHER): Payer: Medicare HMO

## 2021-01-10 DIAGNOSIS — E782 Mixed hyperlipidemia: Secondary | ICD-10-CM

## 2021-01-10 DIAGNOSIS — I10 Essential (primary) hypertension: Secondary | ICD-10-CM

## 2021-01-10 DIAGNOSIS — Z789 Other specified health status: Secondary | ICD-10-CM

## 2021-01-10 NOTE — Chronic Care Management (AMB) (Signed)
    Chronic Care Management Pharmacy Assistant   Name: [name]  MRN: [id number] DOB: [birth year]  Reason for Encounter: Scheduled Follow-Up Appointment With Clinical Pharmacist  Patient scheduled a follow up appointment with [name], CPP this afternoon at 12:30 pm.   April D Calhoun, [place] Pharmacist Assistant [phone]   Follow-Up:  Pharmacist Review

## 2021-01-10 NOTE — Progress Notes (Signed)
Chronic Care Management Pharmacy Note  01/10/2021 Name:  [name] MRN:  [id number] DOB:  [birth year]  Subjective: [name] is an 65 y.o. year old female who is a primary patient of Tabori, Aundra Millet, MD.  The CCM team was consulted for assistance with disease management and care coordination needs.   Engaged with patient by telephone for follow up visit in response to provider referral for pharmacy case management and/or care coordination services.   Consent to Services:  The patient was given information about Chronic Care Management services, agreed to services, and gave verbal consent prior to initiation of services.  Please see initial visit note for detailed documentation.  Patient Care Team: Midge Minium, MD as PCP - General Midge Minium, MD Megan Salon, MD as Consulting Physician (Gynecology) Jettie Booze, MD as Consulting Physician (Cardiology) Leta Baptist, MD as Consulting Physician (Otolaryngology) Brayton Caves, MD as Referring Physician (Psychiatry) Janeth Rase, NP as Nurse Practitioner (Adult Health Nurse Practitioner) Darlyne Russian, LCSW as Social Worker (Licensed Clinical Social Worker) Star Age, MD as Attending Physician (Neurology) [name], [facility] as Pharmacist (Pharmacist)  Objective: Lab Results  Component Value Date   CREATININE 0.60 08/09/2020   BUN 15 08/09/2020   GFR 100.97 08/09/2020   GFRNONAA >90 09/02/2012   GFRAA >90 09/02/2012   NA 139 08/09/2020   K 4.3 08/09/2020   CALCIUM 9.3 08/09/2020   CO2 26 08/09/2020   Lab Results  Component Value Date/Time   HGBA1C 7.0 (H) 08/09/2020 10:49 AM   HGBA1C 6.9 (H) 05/09/2020 10:58 AM   GFR 100.97 08/09/2020 10:49 AM   GFR 107.16 07/05/2020 12:00 PM   MICROALBUR 0.9 03/07/2020 10:38 AM   MICROALBUR 1.0 01/26/2019 10:30 AM    Last diabetic Eye exam:  Lab Results  Component Value Date/Time   HMDIABEYEEXA No Retinopathy 08/02/2020 01:05 PM    Last  diabetic Foot exam: No results found for: HMDIABFOOTEX  Lab Results  Component Value Date   CHOL 127 08/09/2020   HDL 70.20 08/09/2020   LDLCALC 28 08/09/2020   LDLDIRECT 123.0 01/26/2019   TRIG 146.0 08/09/2020   CHOLHDL 2 08/09/2020   Hepatic Function Latest Ref Rng & Units 08/09/2020 07/05/2020 12/14/2019  Total Protein 6.0 - 8.3 g/dL 6.9 6.9 7.1  Albumin 3.5 - 5.2 g/dL 4.7 4.7 4.8  AST 0 - 37 U/L 16 13 11   ALT 0 - 35 U/L 23 18 14   Alk Phosphatase 39 - 117 U/L 19(L) 19(L) 19(L)  Total Bilirubin 0.2 - 1.2 mg/dL 0.5 0.6 0.5  Bilirubin, Direct 0.0 - 0.3 mg/dL 0.1 0.1 0.1   Lab Results  Component Value Date/Time   TSH 1.32 08/09/2020 10:49 AM   TSH 1.13 07/05/2020 12:00 PM   FREET4 0.68 07/05/2020 12:00 PM   FREET4 0.58 (L) 02/23/2013 04:29 PM   CBC Latest Ref Rng & Units 08/09/2020 07/05/2020 05/09/2020  WBC 4.0 - 10.5 K/uL 4.1 3.6(L) 3.1(L)  Hemoglobin 12.0 - 15.0 g/dL 12.2 12.2 12.6  Hematocrit 36.0 - 46.0 % 35.8(L) 35.2(L) 36.8  Platelets 150.0 - 400.0 K/uL 241.0 233.0 228.0   Lab Results  Component Value Date/Time   VD25OH 26.77 (L) 08/09/2020 10:49 AM   VD25OH 16.66 (L) 08/04/2017 08:34 AM    Clinical ASCVD: No  The ASCVD Risk score Mikey Bussing DC Jr., et al., 2013) failed to calculate for the following reasons:   The valid total cholesterol range is 130 to 320 mg/dL  Depression screen [facility] 2/9 12/17/2020 11/05/2020 09/16/2020  Decreased Interest 0 3 0  Down, Depressed, Hopeless 0 1 0  PHQ - 2 Score 0 4 0  Altered sleeping 0 3 0  Tired, decreased energy 0 1 0  Change in appetite 0 0 0  Feeling bad or failure about yourself  0 0 0  Trouble concentrating 0 0 0  Moving slowly or fidgety/restless 0 0 0  Suicidal thoughts 0 0 0  PHQ-9 Score 0 8 0  Difficult doing work/chores Not difficult at all Somewhat difficult Not difficult at all  Some recent data might be hidden    Social History   Tobacco Use  Smoking Status Never Smoker  Smokeless Tobacco Never Used   BP  Readings from Last 3 Encounters:  01/07/21 123/87  12/17/20 120/80  11/05/20 122/68   Pulse Readings from Last 3 Encounters:  01/07/21 80  12/17/20 68  11/05/20 77   Wt Readings from Last 3 Encounters:  01/07/21 227 lb (103 kg)  12/17/20 223 lb 9.6 oz (101.4 kg)  11/05/20 225 lb 9.6 oz (102.3 kg)   Assessment/Interventions: Review of patient past medical history, allergies, medications, health status, including review of consultants reports, laboratory and other test data, was performed as part of comprehensive evaluation and provision of chronic care management services.   SDOH:  (Social Determinants of Health) assessments and interventions performed: Yes  CCM Care Plan Allergies  Allergen Reactions  . Nitrofurantoin Monohyd Macro Nausea Only    Severe headache  . Statins Other (See Comments)    Severe weakness, pain  . Sulfa Antibiotics Nausea And Vomiting  . Alprazolam Anxiety and Other (See Comments)    Hyper   . Amoxicillin Rash  . Prednisone Anxiety   Medications Reviewed Today    Reviewed by [name], [facility] (Pharmacist) on 01/10/21 at 1324  Med List Status: <None>  Medication Order Taking? Sig Documenting Provider Last Dose Status Informant  Alirocumab (PRALUENT) 150 MG/ML SOAJ [id number] No Inject 150 mg into the skin every 14 (fourteen) days. Jettie Booze, MD Taking Active   ALPHAGAN P 0.1 % SOLN [id number] No Place 1 drop into both eyes every 8 (eight) hours.  [provider] Taking Active Self  azelastine (ASTELIN) 0.1 % nasal spray [id number] No PLACE 2 SPRAYS INTO EACH NOSTRIL TWO TIMES A DAY AS DIRECTED Midge Minium, MD Taking Active   clonazePAM (KLONOPIN) 2 MG tablet [id number] No 1 mg 2 (two) times daily.  [provider] Taking Active            Med Note Gar Ponto   Fri Dec 29, 2019  6:22 PM)    Cyanocobalamin (VITAMIN B-12 PO) [id number] No Take by mouth. [provider] Taking Active   fenofibrate (TRICOR)  145 MG tablet [id number] No TAKE ONE TABLET BY MOUTH DAILY Midge Minium, MD Taking Active   fexofenadine (ALLEGRA) 180 MG tablet [id number] No Take 180 mg by mouth daily. [provider] Taking Active   fluticasone (FLONASE) 50 MCG/ACT nasal spray [id number] No SPRAY TWO SPRAYS IN EACH NOSTRIL ONCE DAILY Midge Minium, MD Taking Active   fluticasone (FLOVENT HFA) 110 MCG/ACT inhaler [id number] No Inhale 2 puffs into the lungs in the morning and at bedtime. Midge Minium, MD Taking Active   gabapentin (NEURONTIN) 600 MG tablet [id number] No Take 1,200 mg by mouth 2 (two) times daily.  [provider] Taking Active Self  HYDROcodone-acetaminophen (NORCO/VICODIN) 5-325 MG tablet [id number]  No Take 1 tablet by mouth every 6 (six) hours as needed for moderate pain. Midge Minium, MD Taking Active   lamoTRIgine (LAMICTAL) 200 MG tablet [id number] No Take 200 mg by mouth 2 (two) times daily. [provider] Taking Active Self  LUMIGAN 0.01 % SOLN [id number] No Place 1 drop into both eyes at bedtime.  [provider] Taking Active   meloxicam (MOBIC) 15 MG tablet [id number] No Take 1 tablet (15 mg total) by mouth daily. Midge Minium, MD Taking Active   methocarbamol (ROBAXIN) 500 MG tablet [id number] No Take 1 tablet (500 mg total) by mouth every 8 (eight) hours as needed. Petrucelli, Glynda Jaeger, PA-C Taking Active   metoprolol succinate (TOPROL-XL) 50 MG 24 hr tablet [id number] No TAKE ONE TABLET BY MOUTH DAILY WITH A MEAL Midge Minium, MD Taking Active   montelukast (SINGULAIR) 10 MG tablet [id number] No Take 1 tablet (10 mg total) by mouth at bedtime. Midge Minium, MD Taking Active   Multiple Vitamin (MULTIVITAMIN WITH MINERALS) TABS tablet [id number] No Take 1 tablet by mouth daily.  [provider] Taking Active   OLANZapine (ZYPREXA) 10 MG tablet [id number] No Take 10 mg by mouth daily as needed. [provider] Taking  Active   omega-3 acid ethyl esters (LOVAZA) 1 g capsule [id number] No Take 2 capsules (2 g total) by mouth 2 (two) times daily. Jettie Booze, MD Taking Active   ondansetron (ZOFRAN ODT) 4 MG disintegrating tablet [id number] No Take 1 tablet (4 mg total) by mouth every 8 (eight) hours as needed for nausea or vomiting. Scot Jun, FNP Taking Active   Pyridoxine HCl (VITAMIN B-6 PO) [id number] No Take by mouth. [provider] Taking Active   QUEtiapine (SEROQUEL XR) 400 MG 24 hr tablet [id number] No Take one tablet every evening at bedtime [provider] Taking Active            Med Note Landry Mellow, Marvetta Gibbons   Wed Mar 10, 2017  2:14 PM)    QUEtiapine (SEROQUEL) 50 MG tablet [id number] No As needed [provider] Taking Active Self           Med Note Mendel Ryder   Wed Mar 10, 2017  2:14 PM)    venlafaxine (EFFEXOR-XR) 75 MG 24 hr capsule [id number] No Take 225 mg by mouth daily. [provider] Taking Active Self  Vitamin D, Ergocalciferol, (DRISDOL) 1.25 MG (50000 UNIT) CAPS capsule [id number] No Take 1 capsule (50,000 Units total) by mouth every 7 (seven) days. Midge Minium, MD Taking Active          Patient Active Problem List   Diagnosis Date Noted  . Sleep related hypoxia 12/17/2020  . Positive ANA (antinuclear antibody) 08/14/2020  . Chronically dry eyes, bilateral 08/14/2020  . Dry mouth 08/14/2020  . Allergic rhinitis due to pollen 12/13/2018  . Diet-controlled diabetes mellitus ([place]) 02/09/2018  . Statin intolerance 11/04/2017  . Family history of early CAD 03/10/2017  . HTN (hypertension) 02/15/2017  . Herniated nucleus pulposus, L5-S1 06/22/2014  . Asthma, moderate persistent 10/09/2013  . Obesity (BMI 30-39.9) 10/09/2013  . Fatigue 12/12/2012  . GERD (gastroesophageal reflux disease) 09/02/2012  . Vitamin D deficiency 09/15/2010  . DEPRESSIVE DISORDER 07/17/2010  . RHINITIS 06/14/2009  . MUSCLE WEAKNESS (GENERALIZED)  01/03/2009  . Hyperlipidemia 10/10/2008  . BIPOLAR AFFECTIVE DISORDER 10/10/2008  . ADD 10/10/2008   Immunization History  Administered Date(s) Administered  . Influenza Split 09/02/2012  .  Influenza,inj,Quad PF,6+ Mos 10/09/2013, 08/13/2015, 08/17/2016, 07/28/2017, 08/03/2018, 08/08/2019, 07/20/2020  . Moderna Sars-Covid-2 Vaccination 02/27/2019, 01/27/2020, 09/11/2020  . Pneumococcal Polysaccharide-23 09/02/2012  . Tdap 07/15/2012  . Zoster Recombinat (Shingrix) 11/10/2018, 06/09/2019    Conditions to be addressed/monitored:  Hypertension, Hyperlipidemia, Diabetes, Depression and Anxiety Care Plan : [place]  Updates made by [name], [facility] since 01/10/2021 12:00 AM    Problem: HLD HTN DMII GERD     Long-Range Goal: Disease Management   Start Date: 01/10/2021  Expected End Date: 01/10/2022  This Visit's Progress: On track  Priority: High  Note:   Current Barriers:  . HLD HTN DMII GERD  Pharmacist Clinical Goal(s):  Marland Kitchen Over the next 365 days, patient will achieve adherence to monitoring guidelines and medication adherence to achieve therapeutic efficacy . contact provider office for questions/concerns as evidenced notation of same in electronic health record through collaboration with PharmD and provider.   Interventions: . 1:1 collaboration with Midge Minium, MD regarding development and update of comprehensive plan of care as evidenced by provider attestation and co-signature . Inter-disciplinary care team collaboration (see longitudinal plan of care) . Comprehensive medication review performed; medication list updated in electronic medical record  Hypertension (BP goal <130/80) -Controlled -Current treatment: . Metoprolol succinate 50 mg once daily -Current home readings: n/a -Current exercise habits:  trying to walk dog most days of the week.  -Denies hypotensive/hypertensive symptoms -Educated on Daily salt intake goal < 2300 mg; Exercise goal of  150 minutes per week; -Counseled to monitor BP at home, document, and provide log at future appointments -Counseled on diet and exercise extensively Recommended to continue current medication  Hyperlipidemia: (LDL goal < 70) -Controlled -Current treatment: . Praluent 150 mg/mL- 150 mg injection into the skin every 14 days  . Fenofibrate 145 mg once daily . Lovaza 2 capsules by mouth 2 times daily  -Medications previously tried: statin intolerant  -Current exercise habits: see htn  -Educated on Cholesterol goals;  Benefits of statin for ASCVD risk reduction; Importance of limiting foods high in cholesterol; Exercise goal of 150 minutes per week; -Counseled on diet and exercise extensively Recommended to continue current medication Currently enrolled with [employer] grant      Medication Assistance: Application for Lumigan alphagan  medication assistance program. in process.  Anticipated assistance start date TBD.  See plan of care for additional detail. Patient's preferred pharmacy is:  [facility], [place] [address] Phone: [phone] Fax: [phone]  [id number] [address] [id number]: [phone]: [phone] We discussed: Current pharmacy is preferred with insurance plan and patient is satisfied with pharmacy services. Patient decided to: Continue current medication management strategy Patient agrees to Care Plan and Follow-up.  Current Barriers:  . HLD HTN DMII GERD  Pharmacist Clinical Goal(s):  .Marland KitchenOver the next 365 days, patient will achieve adherence to monitoring guidelines and medication adherence to achieve therapeutic efficacy . contact provider office for questions/concerns as evidenced notation of same in electronic health record through collaboration with PharmD  and provider.   Interventions: . 1:1 collaboration with TMidge Minium MD regarding development and update of comprehensive plan of care as evidenced by provider attestation and co-signature . Inter-disciplinary care team collaboration (see longitudinal plan of care) . Comprehensive medication review performed; medication list updated in electronic medical  record  Hypertension (BP goal <130/80) -Controlled -Current treatment: . Metoprolol succinate 50 mg once daily -Current home readings: n/a -Current exercise habits:  trying to walk dog most days of the week.  -Denies hypotensive/hypertensive symptoms -Educated on Daily salt intake goal < 2300 mg; Exercise goal of 150 minutes per week; -Counseled to monitor BP at home, document, and provide log at future appointments -Counseled on diet and exercise extensively Recommended to continue current medication  Hyperlipidemia: (LDL goal < 70) -Controlled -Current treatment: . Praluent 150 mg/mL- 150 mg injection into the skin every 14 days  . Fenofibrate 145 mg once daily . Lovaza 2 capsules by mouth 2 times daily  -Medications previously tried: statin intolerant  -Current exercise habits: see htn  -Educated on Cholesterol goals;  Benefits of statin for ASCVD risk reduction; Importance of limiting foods high in cholesterol; Exercise goal of 150 minutes per week; -Counseled on diet and exercise extensively Recommended to continue current medication Currently enrolled with [employer] grant   Psychiatry referral - addressed by PCP 01/10/2021. -pt is requesting referral to new psychiatrist. Reports that she has not been sleeping much lately, and has a lot of anxiety. Currently dealing with stressors related to family and finance. Feels that since Dr Ebony Hail retired, it's been difficult for her to achieve a good balance with current medications.   Patient Goals/Self-Care Activities . Over the next 365 days, patient will:  - take  medications as prescribed collaborate with provider on medication access solutions target a minimum of 150 minutes of moderate intensity exercise weekly  Future Appointments  Date Time Provider [place]  02/03/2021 11:15 AM Rigoberto Noel, MD [facility] None  03/14/2021 11:00 AM [facility] CCM PHARMACIST [facility]  04/25/2021  1:30 PM [facility] CCM PHARMACIST [facility]  09/01/2021  3:00 PM [facility] HEALTH COACH [facility]  01/07/2022 10:45 AM [name], NP [facility] None   Follow-up plan with Care Management Team: . CPA: contact [employer] assist to see if they have received applications for lumigan and alphagan from dr Satira Sark. I have made copies of application so if they have not received, please let me know and I will mail to patient so she can take to Dr Satira Sark directly. . RPH: 2 month f/u telephone visit  [name], Pharm.D., BCGP Clinical Pharmacist [facility] [phone]

## 2021-01-10 NOTE — Telephone Encounter (Signed)
Called and spoke with patient about psych concerns. Gave her the recommended numbers to psychiatry per your order. Patient understood. No further concerns at this time.

## 2021-01-10 NOTE — Patient Instructions (Signed)
[name],  Thank you for taking the time to review your medications with me today.  I have included our care Hill/goals in the following pages. Please review and call me at [phone] with any questions!  Thanks! Ellin Mayhew, Pharm.D., BCGP Clinical Pharmacist [facility] [place] [phone] Patient Care Hill: [name]    Problem Identified: HLD HTN DMII GERD     Long-Range Goal: Disease Management   Start Date: 01/10/2021  Expected End Date: 01/10/2022  This Visit's Progress: On track  Priority: High  Note:   Current Barriers:  . HLD HTN DMII GERD  Pharmacist Clinical Goal(s):  Marland Kitchen Over the next 365 days, patient will achieve adherence to monitoring guidelines and medication adherence to achieve therapeutic efficacy . contact provider office for questions/concerns as evidenced notation of same in electronic health record through collaboration with PharmD and provider.   Interventions: . 1:1 collaboration with Midge Minium, MD regarding development and update of comprehensive Hill of care as evidenced by provider attestation and co-signature . Inter-disciplinary care team collaboration (see longitudinal Hill of care) . Comprehensive medication review performed; medication list updated in electronic medical record  Hypertension (BP goal <130/80) -Controlled -Current treatment: . Metoprolol succinate 50 mg once daily -Current home readings: n/a -Current exercise habits:  trying to walk dog most days of the week.  -Denies hypotensive/hypertensive symptoms -Educated on Daily salt intake goal < 2300 mg; Exercise goal of 150 minutes per week; -Counseled to monitor BP at home, document, and provide log at future appointments -Counseled on diet and exercise extensively Recommended to continue current medication  Hyperlipidemia: (LDL goal < 70) -Controlled -Current treatment: . Praluent 150 mg/mL- 150 mg  injection into the skin every 14 days  . Fenofibrate 145 mg once daily . Lovaza 2 capsules by mouth 2 times daily  -Medications previously tried: statin intolerant  -Current exercise habits: see htn  -Educated on Cholesterol goals;  Benefits of statin for ASCVD risk reduction; Importance of limiting foods high in cholesterol; Exercise goal of 150 minutes per week; -Counseled on diet and exercise extensively Recommended to continue current medication Currently enrolled with [employer] grant       The patient verbalized understanding of instructions provided today and agreed to receive a MyChart copy of patient instruction and/or educational materials. Telephone follow up appointment with pharmacy team member scheduled for: See next appointment with "Care Management Staff" under "What's Next" below.

## 2021-01-10 NOTE — Telephone Encounter (Signed)
Unfortunately we are not able to refer to psychiatry due to the high rate of no show appts, but I would recommend she call Dr Starleen Arms office [phone]) or [facility] [phone]) or [place] [phone])

## 2021-01-10 NOTE — Telephone Encounter (Signed)
Patient called stating that she is unsatisfied with her current psych and would like to be referred to another one as she feels that she is not getting the help she needs. She also states that she is not sleeping well. Please advise

## 2021-01-10 NOTE — Telephone Encounter (Signed)
Patient would like to be referred to another psychiatrist - She does not feel like the one she is seeing is helping her.  She is also having problems sleeping. Please advise.

## 2021-01-13 ENCOUNTER — Other Ambulatory Visit: Payer: Self-pay | Admitting: Emergency Medicine

## 2021-01-13 DIAGNOSIS — J301 Allergic rhinitis due to pollen: Secondary | ICD-10-CM

## 2021-01-13 DIAGNOSIS — E782 Mixed hyperlipidemia: Secondary | ICD-10-CM

## 2021-01-13 MED ORDER — FENOFIBRATE 145 MG PO TABS: 145.0000 mg | ORAL_TABLET | Freq: Every day | ORAL | 1 refills | Status: DC

## 2021-01-13 MED ORDER — FLUTICASONE PROPIONATE 50 MCG/ACT NA SUSP: NASAL | 3 refills | Status: DC

## 2021-01-15 DIAGNOSIS — R69 Illness, unspecified: Secondary | ICD-10-CM | POA: Diagnosis not present

## 2021-01-16 ENCOUNTER — Telehealth: Payer: Self-pay

## 2021-01-16 NOTE — Telephone Encounter (Signed)
-----   Message from April D Calhoun sent at 01/10/2021  3:23 PM EST ----- I called and spoke with my abbvie assist in regards to patients applications for medications Lumigan and Alphagan. They received both applications. However, they were both incomplete (missing state license number and NPI) page 3 with patient's information such as income and insurance was not sent. I called and spoke with the patient and informed her of this missing information. Patient wishes to pick up copies of applications to take to Dr. Claudia Desanctis office for them to resend the applications with all information needed.  April D Calhoun, [place] Pharmacist Assistant [phone]

## 2021-01-21 ENCOUNTER — Other Ambulatory Visit: Payer: Self-pay

## 2021-01-21 ENCOUNTER — Telehealth: Payer: Self-pay | Admitting: Family Medicine

## 2021-01-21 DIAGNOSIS — E119 Type 2 diabetes mellitus without complications: Secondary | ICD-10-CM

## 2021-01-21 NOTE — Telephone Encounter (Signed)
Referral has been sent. Pt has been notified to allow 7-10 business for scheduling.

## 2021-01-21 NOTE — Telephone Encounter (Signed)
[name] needs a referral sent in for this year to [facility]

## 2021-01-24 DIAGNOSIS — G4733 Obstructive sleep apnea (adult) (pediatric): Secondary | ICD-10-CM | POA: Diagnosis not present

## 2021-01-27 NOTE — Progress Notes (Signed)
Cm sent to [facility]

## 2021-02-03 ENCOUNTER — Ambulatory Visit (INDEPENDENT_AMBULATORY_CARE_PROVIDER_SITE_OTHER): Payer: Medicare HMO

## 2021-02-03 ENCOUNTER — Other Ambulatory Visit: Payer: Self-pay

## 2021-02-03 ENCOUNTER — Encounter: Payer: Self-pay | Admitting: Pulmonary Disease

## 2021-02-03 ENCOUNTER — Ambulatory Visit (INDEPENDENT_AMBULATORY_CARE_PROVIDER_SITE_OTHER): Payer: Medicare HMO | Admitting: Pulmonary Disease

## 2021-02-03 VITALS — BP 120/82 | HR 80 | Temp 97.2°F | Ht 68.0 in | Wt 223.0 lb

## 2021-02-03 DIAGNOSIS — G4734 Idiopathic sleep related nonobstructive alveolar hypoventilation: Secondary | ICD-10-CM

## 2021-02-03 DIAGNOSIS — J4540 Moderate persistent asthma, uncomplicated: Secondary | ICD-10-CM

## 2021-02-03 DIAGNOSIS — R0902 Hypoxemia: Secondary | ICD-10-CM | POA: Diagnosis not present

## 2021-02-03 NOTE — Patient Instructions (Signed)
  CXR today  Recommend breathing test Your oxygen levels drop in your sleep and you would need further testing for your lungs to figure out why.  You may even need oxygen during sleep.  Please discuss with Drs Rexene Alberts & Birdie Riddle

## 2021-02-03 NOTE — Assessment & Plan Note (Signed)
Continue Flovent and use albuterol only on an as-needed basis

## 2021-02-03 NOTE — Progress Notes (Signed)
Subjective:    Patient ID: [name], female    DOB: 25-Jan-1956, 65 y.o.   MRN: [id number]  HPI  Chief Complaint  Patient presents with  . Consult    Here for CXR to see if chest is clear after having home sleep test done. On CPAP that is managed by Dr. Rexene Alberts.    65 year old never smoker with OSA referred to [place] for evaluation of nocturnal hypoxia in spite of CPAP  PMH -bipolar, anxiety, hypertension, diabetes, asthma  I have  reviewed her evaluation 02/2014 by Dr. Gwenette Greet for chronic cough more than 20 years, impression was this is related to upper airway cough syndrome or reflux, spirometry was normal, chest x-ray nml -Qvar was continued   Chest x-ray 12/2019 clear  She underwent polysomnogram which showed very mild OSA, worse in the supine position with significant nocturnal desaturations.  On a subsequent titration study CPAP was titrated to 12 cm, central apneas seem to emerge with treatment and desaturations persisted.  She was initiated with CPAP and she seems to be compliant with this, last download from 01/2021 was reviewed which shows good control of events on 10 cm.  However nocturnal oximetry showed persistent desaturations on CPAP/room air .  Hence she was referred to [place].  Patient however feels that she was referred to [place] for chest x-ray.  She absolutely does not want to get started on nocturnal oxygen.  She feels like she is breathing fine during the daytime. In fact she denies cough or dyspnea.  She goes walking with her dog.  On her previous evaluation she was noted to have a chronic cough for 20 years and she reports that this cough is completely resolved.  I wonder if she is underreporting her symptoms because she does not want to go on oxygen.  She reports asthma for many years for which she has been using Flovent and reports compliance with 2 puffs twice daily.  She rarely needs albuterol.  She reports that she does not have asthma attacks.  She does have seasonal  allergies for which she takes over-the-counter medications  Epworth sleepiness score is 7. Bedtime is between 10:11 PM, sleep latency can take a few minutes, reports 1-2 nocturnal awakenings and is out of bed by 8:30 AM feeling rested without dryness of mouth or headaches weight has fluctuated within 5 pounds  Significant tests/ events reviewed  ONO on CPAP/RA 12/09/20  Total duration of test time was 8 hours and 17 minutes, average oxygen saturation was 90%, nadir was 83%, time below or at 88% saturation was 112 minutes.    NPSG 11/2014 AHI 5.8/hour supine AHI 16/hour, lowest desaturation 77%. 01/2015 CPAP titration CPAP titrated up to 12 cm, central apneas emerged with persistent desaturations  Spirometry 02/2014 normal, FEV1 81%, FVC 82%   Past Medical History:  Diagnosis Date  . Allergic rhinitis   . Anxiety   . Arthritis    R knee, Left Knee  . Asthma   . Bipolar disorder ([place])   . Cataract   . Complication of anesthesia    had bad anxiety after anesthesia  . Constipation, chronic   . Depression   . Diabetes mellitus without complication ([place])   . Dysrhythmia    Palpations occ, takes Tenormin  . GERD (gastroesophageal reflux disease)   . Heart murmur   . Hyperlipidemia   . Hypertension   . MVP (mitral valve prolapse)   . Urinary tract infection    frequent, see Urololgist  12/16/2010    Past Surgical History:  Procedure Laterality Date  . ABDOMINAL HYSTERECTOMY  2002   no oophorectomy  . BACK SURGERY  2001   L5 - S1  . CATARACT EXTRACTION    . COLONOSCOPY    . NASAL SINUS SURGERY  01/2010   x2  . TONSILLECTOMY    . TOTAL KNEE ARTHROPLASTY  12/23/2011   Procedure: TOTAL KNEE ARTHROPLASTY;  Surgeon: Ninetta Lights, MD;  Location: [facility];  Service: Orthopedics;  Laterality: Right;  DR MURPHY WANTS 120 MINUTES FOR SURGERY  . TOTAL KNEE ARTHROPLASTY  08/31/2012   Procedure: TOTAL KNEE ARTHROPLASTY;  Surgeon: Ninetta Lights, MD;  Location: [facility];  Service: Orthopedics;   Laterality: Left;    Allergies  Allergen Reactions  . Nitrofurantoin Monohyd Macro Nausea Only    Severe headache  . Statins Other (See Comments)    Severe weakness, pain  . Sulfa Antibiotics Nausea And Vomiting  . Alprazolam Anxiety and Other (See Comments)    Hyper   . Amoxicillin Rash  . Prednisone Anxiety    Social History   Socioeconomic History  . Marital status: Single    Spouse name: Not on file  . Number of children: 0  . Years of education: 51  . Highest education level: Not on file  Occupational History  . Occupation: retired//disability  Tobacco Use  . Smoking status: Never Smoker  . Smokeless tobacco: Never Used  Vaping Use  . Vaping Use: Never used  Substance and Sexual Activity  . Alcohol use: No    Alcohol/week: 0.0 standard drinks    Comment: Occasional glass of wine/"hardly ever"  . Drug use: No  . Sexual activity: Not Currently    Birth control/protection: Abstinence  Other Topics Concern  . Not on file  Social History Narrative   Lives alone with 2 cats. Has pet sitting business.   2 cups of coffee a day, Rare soda    Social Determinants of Health   Financial Resource Strain: Low Risk   . Difficulty of Paying Living Expenses: Not very hard  Food Insecurity: No Food Insecurity  . Worried About Charity fundraiser in the Last Year: Never true  . Ran Out of Food in the Last Year: Never true  Transportation Needs: No Transportation Needs  . Lack of Transportation (Medical): No  . Lack of Transportation (Non-Medical): No  Physical Activity: Sufficiently Active  . Days of Exercise per Week: 7 days  . Minutes of Exercise per Session: 30 min  Stress: No Stress Concern Present  . Feeling of Stress : Only a little  Social Connections: Socially Isolated  . Frequency of Communication with Friends and Family: More than three times a week  . Frequency of Social Gatherings with Friends and Family: Once a week  . Attends Religious Services: Never  .  Active Member of Clubs or Organizations: No  . Attends Archivist Meetings: Never  . Marital Status: Divorced  Human resources officer Violence: Not At Risk  . Fear of Current or Ex-Partner: No  . Emotionally Abused: No  . Physically Abused: No  . Sexually Abused: No    Family History  Problem Relation Age of Onset  . Coronary artery disease Father   . Heart attack Father 27  . Heart disease Father   . Lung disease Mother        Mycobacterium avium intracellulare  . Hypertension Brother   . Hyperlipidemia Brother   . Anesthesia problems  Neg Hx      Review of Systems Constitutional: negative for anorexia, fevers and sweats  Eyes: negative for irritation, redness and visual disturbance  Ears, nose, mouth, throat, and face: negative for earaches, epistaxis, nasal congestion and sore throat  Respiratory: negative for cough, dyspnea on exertion, sputum and wheezing  Cardiovascular: negative for chest pain, dyspnea, lower extremity edema, orthopnea, palpitations and syncope  Gastrointestinal: negative for abdominal pain, constipation, diarrhea, melena, nausea and vomiting  Genitourinary:negative for dysuria, frequency and hematuria  Hematologic/lymphatic: negative for bleeding, easy bruising and lymphadenopathy  Musculoskeletal:negative for arthralgias, muscle weakness and stiff joints  Neurological: negative for coordination problems, gait problems, headaches and weakness  Endocrine: negative for diabetic symptoms including polydipsia, polyuria and weight loss     Objective:   Physical Exam  Gen. Pleasant, obese, in no distress ENT - no lesions, no post nasal drip Neck: No JVD, no thyromegaly, no carotid bruits Lungs: no use of accessory muscles, no dullness to percussion, decreased without rales or rhonchi  Cardiovascular: Rhythm regular, heart sounds  normal, no murmurs or gallops, no peripheral edema Musculoskeletal: No deformities, no cyanosis or clubbing , no  tremors       Assessment & Plan:

## 2021-02-03 NOTE — Assessment & Plan Note (Signed)
I discussed the results of her nocturnal oximetry study on CPAP/room air which showed significant desaturations.  She feels that this was related to anxiety attacks that she has during sleep.  I explained to her that anxiety does not cause oxygen desaturations and would need further pulmonary testing to find out the etiology of nocturnal desaturations.  She feels that she was only sent here for a chest x-ray and does not want further testing.  She absolutely refuses to start on nocturnal oxygen. Her previous spirometry has been normal and previous chest x-ray imaging has been normal.  There is no evidence of ILD either by exam or by previous imaging.  She denies any symptoms of cough or shortness of breath.  She carries a history of mild asthma which appears to be very well controlled on Flovent and she is claims compliance with this After much discussion around these issues, we decided to proceed with chest x-ray alone for now.  She would discuss with her PCP and Dr. Rexene Alberts whether she needs oxygen and further pulmonary testing and will come back to [place] as needed

## 2021-02-04 ENCOUNTER — Telehealth: Payer: Self-pay | Admitting: Family Medicine

## 2021-02-04 ENCOUNTER — Telehealth: Payer: Self-pay | Admitting: Adult Health

## 2021-02-04 NOTE — Telephone Encounter (Signed)
Pt called very irate demanding to speak to a nurse stating that she would like to put in a complaint about the [facility] office she went to yesterday to get her xrays done. Pt states she had a horrible experience and only wants to speak to the RN or provider. She also is demanding to see a MD and not a NP. Pt was informed that a message will be sent to the RN and proceeded to hang up on me. Please advise.

## 2021-02-04 NOTE — Telephone Encounter (Signed)
She is followed by Dr. Rexene Alberts - may be beneficial to schedule follow-up visit with her to discuss further

## 2021-02-04 NOTE — Telephone Encounter (Signed)
I cancelled the annual f/u scheduled 01-07-2022 with Janett Billow, NP.

## 2021-02-04 NOTE — Telephone Encounter (Signed)
Please call patient back to get more information.  I reviewed the note from Dr. Elsworth Soho from yesterday; further evaluation with pulmonary function test was recommended which makes sense to me, especially since I did request the referral for further evaluation of an underlying pulmonary problem causing desaturations at night.  Chest x-ray did not show any acute or obvious abnormalities to account for oxygen desaturations at night.  Further testing was recommended by the pulmonologist and I agree with that. Not sure what we can actually do, but I would encourage her to follow recommendations by the pulmonologist.

## 2021-02-04 NOTE — Telephone Encounter (Signed)
Pt called in stating that she saw Rigoberto Noel, MD yesterday and didn't have a good experience. She would like to see Brand Males she wanted to know how to make that change. Please advise

## 2021-02-04 NOTE — Telephone Encounter (Addendum)
I called pt and had an extended conversation with the pt. Essentially the pt would like to switch MDs and see another pulmonologist. I advised the pt she would need to call the pulmonology office and ask to switch MD's or discuss referral/message with PCP.  Pt verbalized understanding and sts she will call pulmonary and PCP to see how to proceed.  To note pt did not decline pulmonary work up when I spoke with her.

## 2021-02-04 NOTE — Telephone Encounter (Signed)
Noted, thank you

## 2021-02-05 ENCOUNTER — Telehealth: Payer: Self-pay

## 2021-02-05 NOTE — Telephone Encounter (Signed)
Since pt has already seen Dr Chase Caller in the past, she just needs to call the pulmonary office and schedule her follow up with him.

## 2021-02-05 NOTE — Telephone Encounter (Addendum)
-----   Message from Melanie Noel, MD sent at 02/04/2021 10:21 AM EDT ----- Minimal scarring in both lungs No reason for drop in oxygen level identified -if we are to investigate further, would require more testing    Called and spoke with patient about CXR results, patient expressed understanding of results. Nothing Further needed.

## 2021-02-05 NOTE — Telephone Encounter (Signed)
Patient has seen Ramaswamy back on 12/16/20. Ok to refer back to this provider?

## 2021-02-05 NOTE — Telephone Encounter (Signed)
Spoke with patient. I advised patient to reach out to see is she can see Dr Chase Caller and if she wasn't successful then call us back and we can see if we can find her another provider. Patient understood.

## 2021-02-06 ENCOUNTER — Ambulatory Visit: Payer: Medicare HMO | Admitting: Primary Care

## 2021-02-06 ENCOUNTER — Ambulatory Visit: Payer: Medicare HMO | Admitting: Internal Medicine

## 2021-02-06 ENCOUNTER — Encounter: Payer: Self-pay | Admitting: Primary Care

## 2021-02-06 ENCOUNTER — Other Ambulatory Visit: Payer: Self-pay

## 2021-02-06 ENCOUNTER — Telehealth: Payer: Self-pay | Admitting: Primary Care

## 2021-02-06 VITALS — BP 132/74 | HR 75 | Temp 98.1°F | Ht 68.5 in | Wt 224.4 lb

## 2021-02-06 DIAGNOSIS — J4540 Moderate persistent asthma, uncomplicated: Secondary | ICD-10-CM | POA: Diagnosis not present

## 2021-02-06 DIAGNOSIS — G4734 Idiopathic sleep related nonobstructive alveolar hypoventilation: Secondary | ICD-10-CM | POA: Diagnosis not present

## 2021-02-06 NOTE — Patient Instructions (Addendum)
Nice meeting you today Ms Hakimian  I have sent a message to Dr. Chase Caller to make sure it is ok to switch to him  Walk test was normal today, lowest your oxygen went was 92% room air  Recommendations: - Continue to use CPAP every night, no change to pressure setting today. Looks good  - Continue to use Flovent as prescribed - Recommend checking pulmonary function (breathing test) and echocardiogram prior to follow-up   Follow-up - Dr. Chase Caller has openings May 10th, please schedule new patient consult

## 2021-02-06 NOTE — Assessment & Plan Note (Signed)
Patient had nocturnal oximetry study on CPAP/room air which showed significant desaturations. She is complaint with CPAP use and apnea is well controlled on current pressure settings. She had a chest x-ray that showed minimal scarring in both lungs. She has not had advanced imaging that I can tell. Previous spirometry has been normal. No identifiable reason for drop in oxygen level. She is agreeing to proceed with further testing. Ordered full pulmonary function testing and echocardiogram. If DLCO is abnormal on PFTs would recommend HRCT to evaluate scarring. She would like to change providers to Dr. Chase Caller, we will get approval for this from Dr. Elsworth Soho.

## 2021-02-06 NOTE — Telephone Encounter (Signed)
Patient would like to change from Dr. Elsworth Soho to Dr. Chase Caller.   MR you have openings May 10th, is this ok?

## 2021-02-06 NOTE — Progress Notes (Addendum)
@Patient  ID: [name], female    DOB: 1956-10-22, 65 y.o.   MRN: [id number]  Chief Complaint  Patient presents with  . Follow-up    Wants to discuss care, feeling overwhelmed    Referring provider: Midge Minium, MD  HPI: 65 year old female, never smoked.  Past medical history significant for asthma, allergic rhinitis, sleep-related hypoxia, GERD, diet-controlled diabetes, obesity, bipolar affective disorder, upper lipidemia, positive ANA.  Patient of Dr. Elsworth Soho, seen for initial consult on 02/03/2021  Patient is on a CPAP managed by Dr. Rexene Alberts.  She had nocturnal oximetry study on CPAP/room air which showed significant desaturations.  She had a chest x-ray that showed minimal scarring in both lungs.  Previous spirometry has been normal.  No evidence of ILD on imaging or exam.  Patient has history of mild asthma well-controlled on Flovent.  No identifiable reason for drop in oxygen level.  Patient had not wanted further testing to determine etiology of nocturnal desaturations.  Patient refused starting nocturnal oxygen.  02/06/2021- Interim hx Patient presents today for follow-up.  She was recently seen in the office 3 days ago by Dr. Elsworth Soho. She is very anxious and overwhelmed. She would like to change providers to Dr. Chase Caller. She is compliant with CPAP use. Downlaod shows 80% compliance > 4 hours. Pressure 10cm h20 with residual AHI 3.1.  She feels hypoxia may be related to her anxiety or asthma. She experiences anxiety/panic attacks at night. She uses Flovent inahler regularly as prescribed. She prefers Qvar but this is too expensive. She has not needed to use Albuterol rescue inhaler. She has an occasional cough and wheeze. No shortness of breath. She is agreeing to pulmonary function testing and further testing to determine origin of her sleep related hypoxia.   Airview download 01/07/21-02/05/21 Usage 28/30 days (93%); 24 days (80%) > 4 hours Average usage 6 hours 47 mins Pressure  10cm h20 Airleaks 24.3L/min (95%) AHI 3.1    Significant tests/ events reviewed ONO on CPAP/RA 12/09/20  Total duration of test time was 8 hours and 17 minutes, average oxygen saturation was 90%, nadir was 83%, time below or at 88% saturation was 112 minutes.    NPSG 11/2014 AHI 5.8/hour supine AHI 16/hour, lowest desaturation 77%. 01/2015 CPAP titration CPAP titrated up to 12 cm, central apneas emerged with persistent desaturations  Spirometry 02/2014 normal, FEV1 81%, FVC 82%  Allergies  Allergen Reactions  . Nitrofurantoin Monohyd Macro Nausea Only    Severe headache  . Statins Other (See Comments)    Severe weakness, pain  . Sulfa Antibiotics Nausea And Vomiting  . Alprazolam Anxiety and Other (See Comments)    Hyper   . Amoxicillin Rash  . Prednisone Anxiety    Immunization History  Administered Date(s) Administered  . Influenza Split 09/02/2012  . Influenza,inj,Quad PF,6+ Mos 10/09/2013, 08/13/2015, 08/17/2016, 07/28/2017, 08/03/2018, 08/08/2019, 07/20/2020  . Moderna Sars-Covid-2 Vaccination 02/27/2019, 01/27/2020, 09/11/2020  . Pneumococcal Polysaccharide-23 09/02/2012  . Tdap 07/15/2012  . Zoster Recombinat (Shingrix) 11/10/2018, 06/09/2019    Past Medical History:  Diagnosis Date  . Allergic rhinitis   . Anxiety   . Arthritis    R knee, Left Knee  . Asthma   . Bipolar disorder ([place])   . Cataract   . Complication of anesthesia    had bad anxiety after anesthesia  . Constipation, chronic   . Depression   . Diabetes mellitus without complication ([place])   . Dysrhythmia    Palpations occ, takes Tenormin  .  GERD (gastroesophageal reflux disease)   . Heart murmur   . Hyperlipidemia   . Hypertension   . MVP (mitral valve prolapse)   . Urinary tract infection    frequent, see Urololgist 12/16/2010    Tobacco History: Social History   Tobacco Use  Smoking Status Never Smoker  Smokeless Tobacco Never Used   Counseling given: Not Answered   Outpatient  Medications Prior to Visit  Medication Sig Dispense Refill  . Alirocumab (PRALUENT) 150 MG/ML SOAJ Inject 150 mg into the skin every 14 (fourteen) days. 6 mL 3  . ALPHAGAN P 0.1 % SOLN Place 1 drop into both eyes every 8 (eight) hours.     Marland Kitchen azelastine (ASTELIN) 0.1 % nasal spray PLACE 2 SPRAYS INTO EACH NOSTRIL TWO TIMES A DAY AS DIRECTED 30 mL 2  . clonazePAM (KLONOPIN) 2 MG tablet 1 mg 2 (two) times daily.   0  . Cyanocobalamin (VITAMIN B-12 PO) Take by mouth.    . fenofibrate (TRICOR) 145 MG tablet Take 1 tablet (145 mg total) by mouth daily. 90 tablet 1  . fexofenadine (ALLEGRA) 180 MG tablet Take 180 mg by mouth daily.    . fluticasone (FLONASE) 50 MCG/ACT nasal spray SPRAY TWO SPRAYS IN EACH NOSTRIL ONCE DAILY 16 g 3  . fluticasone (FLOVENT HFA) 110 MCG/ACT inhaler Inhale 2 puffs into the lungs in the morning and at bedtime. 1 each 6  . gabapentin (NEURONTIN) 600 MG tablet Take 1,200 mg by mouth 2 (two) times daily.     Marland Kitchen HYDROcodone-acetaminophen (NORCO/VICODIN) 5-325 MG tablet Take 1 tablet by mouth every 6 (six) hours as needed for moderate pain. 20 tablet 0  . lamoTRIgine (LAMICTAL) 200 MG tablet Take 200 mg by mouth 2 (two) times daily.    Marland Kitchen LUMIGAN 0.01 % SOLN Place 1 drop into both eyes at bedtime.     . meloxicam (MOBIC) 15 MG tablet Take 1 tablet (15 mg total) by mouth daily. 30 tablet 1  . methocarbamol (ROBAXIN) 500 MG tablet Take 1 tablet (500 mg total) by mouth every 8 (eight) hours as needed. 15 tablet 0  . metoprolol succinate (TOPROL-XL) 50 MG 24 hr tablet TAKE ONE TABLET BY MOUTH DAILY WITH A MEAL 90 tablet 2  . montelukast (SINGULAIR) 10 MG tablet Take 1 tablet (10 mg total) by mouth at bedtime. 90 tablet 1  . Multiple Vitamin (MULTIVITAMIN WITH MINERALS) TABS tablet Take 1 tablet by mouth daily.     Marland Kitchen OLANZapine (ZYPREXA) 10 MG tablet Take 10 mg by mouth daily as needed.    Marland Kitchen omega-3 acid ethyl esters (LOVAZA) 1 g capsule Take 2 capsules (2 g total) by mouth 2 (two)  times daily. 360 capsule 3  . ondansetron (ZOFRAN ODT) 4 MG disintegrating tablet Take 1 tablet (4 mg total) by mouth every 8 (eight) hours as needed for nausea or vomiting. 20 tablet 0  . Pyridoxine HCl (VITAMIN B-6 PO) Take by mouth.    . QUEtiapine (SEROQUEL XR) 400 MG 24 hr tablet Take one tablet every evening at bedtime  0  . QUEtiapine (SEROQUEL) 50 MG tablet As needed  3  . venlafaxine (EFFEXOR-XR) 75 MG 24 hr capsule Take 225 mg by mouth daily.    . Vitamin D, Ergocalciferol, (DRISDOL) 1.25 MG (50000 UNIT) CAPS capsule Take 1 capsule (50,000 Units total) by mouth every 7 (seven) days. 12 capsule 0   No facility-administered medications prior to visit.    Review of Systems  Review of  Systems  Constitutional: Negative.   HENT: Negative.   Respiratory: Positive for cough and wheezing. Negative for chest tightness and shortness of breath.   Cardiovascular: Negative.    Physical Exam  BP 132/74 (BP Location: Left Arm, Cuff Size: Normal)   Pulse 75   Temp 98.1 F (36.7 C) (Temporal)   Ht 5' 8.5" (1.74 m)   Wt 224 lb 6.4 oz (101.8 kg)   LMP 11/09/2000   SpO2 100%   BMI 33.62 kg/m  Physical Exam Constitutional:      Appearance: Normal appearance.  HENT:     Mouth/Throat:     Comments: Mallampati class II-III Cardiovascular:     Rate and Rhythm: Normal rate and regular rhythm.  Pulmonary:     Effort: Pulmonary effort is normal.     Breath sounds: Normal breath sounds.  Musculoskeletal:        General: Normal range of motion.  Skin:    General: Skin is warm and dry.  Neurological:     General: No focal deficit present.     Mental Status: She is alert and oriented to person, place, and time. Mental status is at baseline.  Psychiatric:     Comments: Pleasant; anxious      Lab Results:  CBC    Component Value Date/Time   WBC 4.1 08/09/2020 1049   RBC 3.97 08/09/2020 1049   HGB 12.2 08/09/2020 1049   HCT 35.8 (L) 08/09/2020 1049   PLT 241.0 08/09/2020 1049    MCV 90.2 08/09/2020 1049   MCH 30.1 09/02/2012 0555   MCHC 34.1 08/09/2020 1049   RDW 13.6 08/09/2020 1049   LYMPHSABS 1.3 08/09/2020 1049   MONOABS 0.2 08/09/2020 1049   EOSABS 0.0 08/09/2020 1049   BASOSABS 0.0 08/09/2020 1049    BMET    Component Value Date/Time   NA 139 08/09/2020 1049   K 4.3 08/09/2020 1049   CL 104 08/09/2020 1049   CO2 26 08/09/2020 1049   GLUCOSE 153 (H) 08/09/2020 1049   GLUCOSE 81 09/11/2008 0000   BUN 15 08/09/2020 1049   CREATININE 0.60 08/09/2020 1049   CALCIUM 9.3 08/09/2020 1049   GFRNONAA >90 09/02/2012 0555   GFRAA >90 09/02/2012 0555    BNP No results found for: BNP  ProBNP No results found for: PROBNP  Imaging: DG Chest 2 View  Result Date: 02/04/2021 CLINICAL DATA:  Hypoxia EXAM: CHEST - 2 VIEW COMPARISON:  12/29/2019 FINDINGS: Linear scarring noted within the a right mid lung zone and left lung base. No superimposed focal pulmonary infiltrate. No pneumothorax or pleural effusion. Cardiac size within normal limits. Pulmonary vascularity is normal. No acute bone abnormality. IMPRESSION: No active cardiopulmonary disease. Electronically Signed   By: Fidela Salisbury MD   On: 02/04/2021 03:58     Assessment & Plan:   Sleep related hypoxia Patient had nocturnal oximetry study on CPAP/room air which showed significant desaturations. She is complaint with CPAP use and apnea is well controlled on current pressure settings. She had a chest x-ray that showed minimal scarring in both lungs. She has not had advanced imaging that I can tell. Previous spirometry has been normal. No identifiable reason for drop in oxygen level. She is agreeing to proceed with further testing. Ordered full pulmonary function testing and echocardiogram. If DLCO is abnormal on PFTs would recommend HRCT to evaluate scarring. She would like to change providers to Dr. Chase Caller, we will get approval for this from Dr. Elsworth Soho.  Asthma, moderate persistent -  Well-controlled on  Flovent. She does have a dry cough. Rare SABA use   Martyn Ehrich, NP 02/06/2021

## 2021-02-06 NOTE — Assessment & Plan Note (Signed)
-   Well-controlled on Flovent. She does have a dry cough. Rare SABA use

## 2021-02-06 NOTE — Telephone Encounter (Signed)
Dr. Elsworth Soho please advise if you are ok with the switch. Thanks.

## 2021-02-06 NOTE — Telephone Encounter (Signed)
Ok fine 

## 2021-02-07 NOTE — Telephone Encounter (Signed)
I am fine with this.

## 2021-02-07 NOTE — Telephone Encounter (Signed)
Called and spoke with patient, advised her that both Dr. Elsworth Soho and Dr. Chase Caller agreed to the change in physician.  Reminded her that she does have an appointment with Dr. Chase Caller on 03/18/21.  She verbalized understanding.  Nothing further needed.

## 2021-02-12 ENCOUNTER — Other Ambulatory Visit: Payer: Self-pay

## 2021-02-12 DIAGNOSIS — M6281 Muscle weakness (generalized): Secondary | ICD-10-CM

## 2021-02-12 MED ORDER — MELOXICAM 15 MG PO TABS: 15.0000 mg | ORAL_TABLET | Freq: Every day | ORAL | 0 refills | Status: DC

## 2021-02-24 DIAGNOSIS — G4733 Obstructive sleep apnea (adult) (pediatric): Secondary | ICD-10-CM | POA: Diagnosis not present

## 2021-02-27 ENCOUNTER — Ambulatory Visit: Payer: Self-pay

## 2021-02-27 NOTE — Progress Notes (Signed)
  Chronic Care Management   Outreach Note - Patient Assistance   Name: [name] MRN: [id number] DOB: [birth year]  Referred by: Midge Minium, MD  Completed PAP applications received from patient for alphagan and lumigan - successful fax to mfg confirmed.   [name], Pharm.D., BCGP Clinical Pharmacist [facility] [phone]

## 2021-03-03 ENCOUNTER — Ambulatory Visit: Payer: Medicare HMO | Admitting: Dietician

## 2021-03-12 ENCOUNTER — Ambulatory Visit (HOSPITAL_COMMUNITY): Payer: Medicare HMO | Attending: Cardiology

## 2021-03-12 ENCOUNTER — Other Ambulatory Visit: Payer: Self-pay

## 2021-03-12 DIAGNOSIS — E785 Hyperlipidemia, unspecified: Secondary | ICD-10-CM | POA: Diagnosis not present

## 2021-03-12 DIAGNOSIS — G4734 Idiopathic sleep related nonobstructive alveolar hypoventilation: Secondary | ICD-10-CM | POA: Diagnosis not present

## 2021-03-12 DIAGNOSIS — I351 Nonrheumatic aortic (valve) insufficiency: Secondary | ICD-10-CM | POA: Insufficient documentation

## 2021-03-12 DIAGNOSIS — I10 Essential (primary) hypertension: Secondary | ICD-10-CM | POA: Insufficient documentation

## 2021-03-12 DIAGNOSIS — E119 Type 2 diabetes mellitus without complications: Secondary | ICD-10-CM | POA: Diagnosis not present

## 2021-03-12 DIAGNOSIS — Z8249 Family history of ischemic heart disease and other diseases of the circulatory system: Secondary | ICD-10-CM | POA: Insufficient documentation

## 2021-03-12 DIAGNOSIS — I2510 Atherosclerotic heart disease of native coronary artery without angina pectoris: Secondary | ICD-10-CM | POA: Diagnosis not present

## 2021-03-12 DIAGNOSIS — I340 Nonrheumatic mitral (valve) insufficiency: Secondary | ICD-10-CM | POA: Diagnosis not present

## 2021-03-12 LAB — ECHOCARDIOGRAM COMPLETE
Area-P 1/2: 3.99 cm2
P 1/2 time: 553 msec
S' Lateral: 2.8 cm

## 2021-03-14 ENCOUNTER — Other Ambulatory Visit (HOSPITAL_COMMUNITY)
Admission: RE | Admit: 2021-03-14 | Discharge: 2021-03-14 | Disposition: A | Discharge status: Home / Self Care | Payer: Medicare HMO | Source: Ambulatory Visit | Attending: Internal Medicine | Admitting: Internal Medicine

## 2021-03-14 ENCOUNTER — Other Ambulatory Visit (HOSPITAL_COMMUNITY): Payer: Medicare HMO

## 2021-03-14 ENCOUNTER — Other Ambulatory Visit: Payer: Self-pay

## 2021-03-14 ENCOUNTER — Ambulatory Visit: Payer: Medicare HMO

## 2021-03-14 DIAGNOSIS — Z01812 Encounter for preprocedural laboratory examination: Secondary | ICD-10-CM | POA: Insufficient documentation

## 2021-03-14 DIAGNOSIS — Z20822 Contact with and (suspected) exposure to covid-19: Secondary | ICD-10-CM | POA: Diagnosis not present

## 2021-03-14 NOTE — Progress Notes (Signed)
  Chronic Care Management   Outreach Note   Name: [name] MRN: [id number] DOB: 05-02-1956  Referred by: Midge Minium, MD Reason for referral: Telephone Appointment with [place] Pharmacist, [name].   Telephone appointment with clinical pharmacist today (03/14/2021) at 11am. If patient immediately returns call, transfer to [phone]. Otherwise, please provide this number so patient can reschedule visit.  -dm + f/u on PAP applications   [name], Pharm.D., BCGP Clinical Pharmacist [facility] [phone]

## 2021-03-15 LAB — SARS CORONAVIRUS 2 (TAT 6-24 HRS): SARS Coronavirus 2: NEGATIVE

## 2021-03-16 ENCOUNTER — Other Ambulatory Visit: Payer: Self-pay | Admitting: Interventional Cardiology

## 2021-03-17 ENCOUNTER — Telehealth (INDEPENDENT_AMBULATORY_CARE_PROVIDER_SITE_OTHER): Payer: Medicare HMO | Admitting: Family Medicine

## 2021-03-17 ENCOUNTER — Encounter: Payer: Self-pay | Admitting: Family Medicine

## 2021-03-17 DIAGNOSIS — M545 Low back pain, unspecified: Secondary | ICD-10-CM

## 2021-03-17 MED ORDER — METHOCARBAMOL 500 MG PO TABS: 500.0000 mg | ORAL_TABLET | Freq: Three times a day (TID) | ORAL | 0 refills | Status: DC | PRN

## 2021-03-17 NOTE — Progress Notes (Signed)
Virtual Visit via Video   I connected with patient on 03/17/21 at 12:30 PM EDT by a video enabled telemedicine application and verified that I am speaking with the correct person using two identifiers.  Location patient: Home Location provider: [facility], Office Persons participating in the virtual visit: Patient, Provider, [place] (Sabrina M)  I discussed the limitations of evaluation and management by telemedicine and the availability of in person appointments. The patient expressed understanding and agreed to proceed.  Subjective:   HPI:   Back pain- pt reports that her 'low back and off to the L' feels like 'a muscle pull or something'.  Pt was playing tug of war w/ dog.  Now has a hard time getting up.  Taking Meloxicam.  Used a left over hydrocodone, 'but that makes my head feel weird'.  Sxs started Saturday night.  No radiation of pain.  No numbness/tingling.  No leg weakness, no incontinence.    ROS:   See pertinent positives and negatives per HPI.  Patient Active Problem List   Diagnosis Date Noted  . Sleep related hypoxia 12/17/2020  . Cervical myelopathy ([place]) 10/16/2020  . Elevated blood-pressure reading, without diagnosis of hypertension 10/16/2020  . Lumbar radiculopathy 10/16/2020  . Positive ANA (antinuclear antibody) 08/14/2020  . Chronically dry eyes, bilateral 08/14/2020  . Dry mouth 08/14/2020  . Allergic rhinitis due to pollen 12/13/2018  . Diet-controlled diabetes mellitus ([place]) 02/09/2018  . Statin intolerance 11/04/2017  . Family history of early CAD 03/10/2017  . HTN (hypertension) 02/15/2017  . Herniated nucleus pulposus, L5-S1 06/22/2014  . Asthma, moderate persistent 10/09/2013  . Obesity (BMI 30-39.9) 10/09/2013  . Fatigue 12/12/2012  . GERD (gastroesophageal reflux disease) 09/02/2012  . Vitamin D deficiency 09/15/2010  . DEPRESSIVE DISORDER 07/17/2010  . RHINITIS 06/14/2009  . MUSCLE WEAKNESS (GENERALIZED) 01/03/2009  .  Hyperlipidemia 10/10/2008  . BIPOLAR AFFECTIVE DISORDER 10/10/2008  . ADD 10/10/2008    Social History   Tobacco Use  . Smoking status: Never Smoker  . Smokeless tobacco: Never Used  Substance Use Topics  . Alcohol use: No    Alcohol/week: 0.0 standard drinks    Comment: Occasional glass of wine/"hardly ever"    Current Outpatient Medications:  .  Alirocumab (PRALUENT) 150 MG/ML SOAJ, Inject 150 mg into the skin every 14 (fourteen) days., Disp: 6 mL, Rfl: 3 .  ALPHAGAN P 0.1 % SOLN, Place 1 drop into both eyes every 8 (eight) hours. , Disp: , Rfl:  .  azelastine (ASTELIN) 0.1 % nasal spray, PLACE 2 SPRAYS INTO EACH NOSTRIL TWO TIMES A DAY AS DIRECTED, Disp: 30 mL, Rfl: 2 .  clonazePAM (KLONOPIN) 2 MG tablet, 1 mg 2 (two) times daily. , Disp: , Rfl: 0 .  Cyanocobalamin (VITAMIN B-12 PO), Take by mouth., Disp: , Rfl:  .  fenofibrate (TRICOR) 145 MG tablet, Take 1 tablet (145 mg total) by mouth daily., Disp: 90 tablet, Rfl: 1 .  fexofenadine (ALLEGRA) 180 MG tablet, Take 180 mg by mouth daily., Disp: , Rfl:  .  fluticasone (FLONASE) 50 MCG/ACT nasal spray, SPRAY TWO SPRAYS IN EACH NOSTRIL ONCE DAILY, Disp: 16 g, Rfl: 3 .  fluticasone (FLOVENT HFA) 110 MCG/ACT inhaler, Inhale 2 puffs into the lungs in the morning and at bedtime., Disp: 1 each, Rfl: 6 .  gabapentin (NEURONTIN) 600 MG tablet, Take 1,200 mg by mouth 2 (two) times daily. , Disp: , Rfl:  .  HYDROcodone-acetaminophen (NORCO/VICODIN) 5-325 MG tablet, Take 1 tablet by mouth every  6 (six) hours as needed for moderate pain., Disp: 20 tablet, Rfl: 0 .  lamoTRIgine (LAMICTAL) 200 MG tablet, Take 200 mg by mouth 2 (two) times daily., Disp: , Rfl:  .  LUMIGAN 0.01 % SOLN, Place 1 drop into both eyes at bedtime. , Disp: , Rfl:  .  meloxicam (MOBIC) 15 MG tablet, Take 1 tablet (15 mg total) by mouth daily., Disp: 30 tablet, Rfl: 0 .  metoprolol succinate (TOPROL-XL) 50 MG 24 hr tablet, TAKE ONE TABLET BY MOUTH DAILY WITH A MEAL, Disp: 90  tablet, Rfl: 2 .  montelukast (SINGULAIR) 10 MG tablet, Take 1 tablet (10 mg total) by mouth at bedtime., Disp: 90 tablet, Rfl: 1 .  Multiple Vitamin (MULTIVITAMIN WITH MINERALS) TABS tablet, Take 1 tablet by mouth daily. , Disp: , Rfl:  .  OLANZapine (ZYPREXA) 10 MG tablet, Take 10 mg by mouth daily as needed., Disp: , Rfl:  .  omega-3 acid ethyl esters (LOVAZA) 1 g capsule, Take 2 capsules (2 g total) by mouth 2 (two) times daily., Disp: 360 capsule, Rfl: 3 .  ondansetron (ZOFRAN ODT) 4 MG disintegrating tablet, Take 1 tablet (4 mg total) by mouth every 8 (eight) hours as needed for nausea or vomiting., Disp: 20 tablet, Rfl: 0 .  Pyridoxine HCl (VITAMIN B-6 PO), Take by mouth., Disp: , Rfl:  .  QUEtiapine (SEROQUEL XR) 400 MG 24 hr tablet, Take one tablet every evening at bedtime, Disp: , Rfl: 0 .  QUEtiapine (SEROQUEL) 50 MG tablet, As needed, Disp: , Rfl: 3 .  venlafaxine (EFFEXOR-XR) 75 MG 24 hr capsule, Take 225 mg by mouth daily., Disp: , Rfl:  .  methocarbamol (ROBAXIN) 500 MG tablet, Take 1 tablet (500 mg total) by mouth every 8 (eight) hours as needed. (Patient not taking: Reported on 03/17/2021), Disp: 15 tablet, Rfl: 0 .  Vitamin D, Ergocalciferol, (DRISDOL) 1.25 MG (50000 UNIT) CAPS capsule, Take 1 capsule (50,000 Units total) by mouth every 7 (seven) days. (Patient not taking: Reported on 03/17/2021), Disp: 12 capsule, Rfl: 0  Allergies  Allergen Reactions  . Nitrofurantoin Monohyd Macro Nausea Only    Severe headache  . Statins Other (See Comments)    Severe weakness, pain  . Sulfa Antibiotics Nausea And Vomiting  . Alprazolam Anxiety and Other (See Comments)    Hyper   . Amoxicillin Rash  . Prednisone Anxiety    Objective:   LMP 11/09/2000  AAOx3, NAD NCAT, EOMI No obvious CN deficits Coloring WNL Pt is able to speak clearly, coherently without shortness of breath or increased work of breathing.  Thought process is linear.  Mood is appropriate.   Assessment and Plan:    Acute L sided low back pain- new.  Occurred after playing tug of war w/ dog.  Described as a 'tight muscle'.  No radiation of pain.  No numbness or incontinence.  Continue Meloxicam daily.  Add Methocarbamol and heat to help w/ spasm.  Pt expressed understanding and is in agreement w/ plan.    Annye Asa, MD 03/17/2021

## 2021-03-18 ENCOUNTER — Ambulatory Visit (INDEPENDENT_AMBULATORY_CARE_PROVIDER_SITE_OTHER): Payer: Medicare HMO | Admitting: Internal Medicine

## 2021-03-18 ENCOUNTER — Other Ambulatory Visit: Payer: Self-pay

## 2021-03-18 ENCOUNTER — Encounter: Payer: Self-pay | Admitting: Internal Medicine

## 2021-03-18 ENCOUNTER — Ambulatory Visit: Payer: Medicare HMO | Admitting: Internal Medicine

## 2021-03-18 VITALS — BP 116/74 | HR 73 | Temp 98.5°F | Ht 69.0 in | Wt 222.8 lb

## 2021-03-18 DIAGNOSIS — G4734 Idiopathic sleep related nonobstructive alveolar hypoventilation: Secondary | ICD-10-CM | POA: Diagnosis not present

## 2021-03-18 DIAGNOSIS — J4530 Mild persistent asthma, uncomplicated: Secondary | ICD-10-CM

## 2021-03-18 DIAGNOSIS — M6281 Muscle weakness (generalized): Secondary | ICD-10-CM

## 2021-03-18 LAB — PULMONARY FUNCTION TEST
DL/VA % pred: 83 %
DL/VA: 3.35 ml/min/mmHg/L
DLCO cor % pred: 85 %
DLCO cor: 20.17 ml/min/mmHg
DLCO unc % pred: 85 %
DLCO unc: 20.17 ml/min/mmHg
FEF 25-75 Post: 2.75 L/sec
FEF 25-75 Pre: 1.75 L/sec
FEF2575-%Change-Post: 56 %
FEF2575-%Pred-Post: 109 %
FEF2575-%Pred-Pre: 69 %
FEV1-%Change-Post: 15 %
FEV1-%Pred-Post: 96 %
FEV1-%Pred-Pre: 83 %
FEV1-Post: 2.86 L
FEV1-Pre: 2.46 L
FEV1FVC-%Change-Post: 11 %
FEV1FVC-%Pred-Pre: 91 %
FEV6-%Change-Post: 6 %
FEV6-%Pred-Post: 97 %
FEV6-%Pred-Pre: 91 %
FEV6-Post: 3.63 L
FEV6-Pre: 3.4 L
FEV6FVC-%Change-Post: 0 %
FEV6FVC-%Pred-Post: 103 %
FEV6FVC-%Pred-Pre: 103 %
FVC-%Change-Post: 4 %
FVC-%Pred-Post: 94 %
FVC-%Pred-Pre: 90 %
FVC-Post: 3.63 L
FVC-Pre: 3.48 L
Post FEV1/FVC ratio: 79 %
Post FEV6/FVC ratio: 100 %
Pre FEV1/FVC ratio: 71 %
Pre FEV6/FVC Ratio: 100 %
RV % pred: 135 %
RV: 3.11 L
TLC % pred: 113 %
TLC: 6.61 L

## 2021-03-18 LAB — CBC WITH DIFFERENTIAL/PLATELET
Basophils Absolute: 0 10*3/uL (ref 0.0–0.1)
Basophils Relative: 0.2 % (ref 0.0–3.0)
Eosinophils Absolute: 0 10*3/uL (ref 0.0–0.7)
Eosinophils Relative: 0 % (ref 0.0–5.0)
HCT: 38 % (ref 36.0–46.0)
Hemoglobin: 13.1 g/dL (ref 12.0–15.0)
Lymphocytes Relative: 36.2 % (ref 12.0–46.0)
Lymphs Abs: 1.6 10*3/uL (ref 0.7–4.0)
MCHC: 34.4 g/dL (ref 30.0–36.0)
MCV: 88.4 fl (ref 78.0–100.0)
Monocytes Absolute: 0.3 10*3/uL (ref 0.1–1.0)
Monocytes Relative: 7.7 % (ref 3.0–12.0)
Neutro Abs: 2.5 10*3/uL (ref 1.4–7.7)
Neutrophils Relative %: 55.9 % (ref 43.0–77.0)
Platelets: 238 10*3/uL (ref 150.0–400.0)
RBC: 4.3 Mil/uL (ref 3.87–5.11)
RDW: 13.6 % (ref 11.5–15.5)
WBC: 4.5 10*3/uL (ref 4.0–10.5)

## 2021-03-18 LAB — NITRIC OXIDE: Nitric Oxide: 34

## 2021-03-18 MED ORDER — BUDESONIDE-FORMOTEROL FUMARATE 160-4.5 MCG/ACT IN AERO: 2.0000 | INHALATION_SPRAY | Freq: Two times a day (BID) | RESPIRATORY_TRACT | 6 refills | Status: DC

## 2021-03-18 MED ORDER — MELOXICAM 15 MG PO TABS: 15.0000 mg | ORAL_TABLET | Freq: Every day | ORAL | 3 refills | Status: DC

## 2021-03-18 NOTE — Patient Instructions (Addendum)
ICD-10-CM   1. Poorly controlled mild persistent asthma  J45.30     Evidence of the breathing test and his symptoms suggest that asthma is not under good control  Plan - do FENO test 03/18/2021 - Do blood work CBC with differential, blood IgE and RAST allergy panel -Stop Flovent - Start Symbicort 160/4.5, 2 puff 2 times daily scheduled [if it is too expensive call us] - Continue using albuterol as needed -Hold off any CT scan at this point -Hold off retesting for nocturnal oxygen at this point [do respect your desire not to use nocturnal oxygen at this point]  Follow-up - Return in 4 weeks to review results and also progress with Symbicort  -ACT questionnaire at follow-up

## 2021-03-18 NOTE — Progress Notes (Signed)
You are seeing this patient today. Looks ok

## 2021-03-18 NOTE — Telephone Encounter (Signed)
Patient is requesting a refill of the following medications: Requested Prescriptions   Pending Prescriptions Disp Refills  . meloxicam (MOBIC) 15 MG tablet 30 tablet 0    Sig: Take 1 tablet (15 mg total) by mouth daily.    Date of patient request: 03/16/21 Last office visit: 03/17/21 Date of last refill: 02/12/21 Last refill amount: 30 0 refills

## 2021-03-18 NOTE — Progress Notes (Signed)
Chief Complaint  Patient presents with  . Consult    Here for CXR to see if chest is clear after having home sleep test done. On CPAP that is managed by Dr. Rexene Alberts.    65 year old never smoker with OSA referred to [place] for evaluation of nocturnal hypoxia in spite of CPAP  PMH -bipolar, anxiety, hypertension, diabetes, asthma  I have  reviewed her evaluation 02/2014 by Dr. Gwenette Greet for chronic cough more than 20 years, impression was this is related to upper airway cough syndrome or reflux, spirometry was normal, chest x-ray nml -Qvar was continued   Chest x-ray 12/2019 clear  She underwent polysomnogram which showed very mild OSA, worse in the supine position with significant nocturnal desaturations.  On a subsequent titration study CPAP was titrated to 12 cm, central apneas seem to emerge with treatment and desaturations persisted.  She was initiated with CPAP and she seems to be compliant with this, last download from 01/2021 was reviewed which shows good control of events on 10 cm.  However nocturnal oximetry showed persistent desaturations on CPAP/room air .  Hence she was referred to [place].  Patient however feels that she was referred to [place] for chest x-ray.  She absolutely does not want to get started on nocturnal oxygen.  She feels like she is breathing fine during the daytime. In fact she denies cough or dyspnea.  She goes walking with her dog.  On her previous evaluation she was noted to have a chronic cough for 20 years and she reports that this cough is completely resolved.  I wonder if she is underreporting her symptoms because she does not want to go on oxygen.  She reports asthma for many years for which she has been using Flovent and reports compliance with 2 puffs twice daily.  She rarely needs albuterol.  She reports that she does not have asthma attacks.  She does have seasonal allergies for which she takes over-the-counter medications  Epworth sleepiness score is 7. Bedtime is  between 10:11 PM, sleep latency can take a few minutes, reports 1-2 nocturnal awakenings and is out of bed by 8:30 AM feeling rested without dryness of mouth or headaches weight has fluctuated within 5 pounds  Significant tests/ events reviewed  ONO on CPAP/RA 12/09/20  Total duration of test time was 8 hours and 17 minutes, average oxygen saturation was 90%, nadir was 83%, time below or at 88% saturation was 112 minutes.    NPSG 11/2014 AHI 5.8/hour supine AHI 16/hour, lowest desaturation 77%. 01/2015 CPAP titration CPAP titrated up to 12 cm, central apneas emerged with persistent desaturations  Spirometry 02/2014 normal, FEV1 81%, FVC 82%  xxxxxxxxxxxxxxxxxxxxxxxxxxxxxxxxxxxxxxxxxxxxxxxxxxxxxxxxxxxxxxxxxxxxxxx   02/06/2021- Interim hx 65 year old female, never smoked.  Past medical history significant for asthma, allergic rhinitis, sleep-related hypoxia, GERD, diet-controlled diabetes, obesity, bipolar affective disorder, upper lipidemia, positive ANA.  Patient of Dr. Elsworth Soho, seen for initial consult on 02/03/2021  Patient is on a CPAP managed by Dr. Rexene Alberts.  She had nocturnal oximetry study on CPAP/room air which showed significant desaturations.  She had a chest x-ray that showed minimal scarring in both lungs.  Previous spirometry has been normal.  No evidence of ILD on imaging or exam.  Patient has history of mild asthma well-controlled on Flovent.  No identifiable reason for drop in oxygen level.  Patient had not wanted further testing to determine etiology of nocturnal desaturations.  Patient refused starting nocturnal oxygen.  Patient presents today for follow-up.  She was  recently seen in the office 3 days ago by Dr. Elsworth Soho. She is very anxious and overwhelmed. She would like to change providers to Dr. Chase Caller. She is compliant with CPAP use. Downlaod shows 80% compliance > 4 hours. Pressure 10cm h20 with residual AHI 3.1.  She feels hypoxia may be related to her anxiety or asthma. She  experiences anxiety/panic attacks at night. She uses Flovent inahler regularly as prescribed. She prefers Qvar but this is too expensive. She has not needed to use Albuterol rescue inhaler. She has an occasional cough and wheeze. No shortness of breath. She is agreeing to pulmonary function testing and further testing to determine origin of her sleep related hypoxia.   Airview download 01/07/21-02/05/21 Usage 28/30 days (93%); 24 days (80%) > 4 hours Average usage 6 hours 47 mins Pressure 10cm h20 Airleaks 24.3L/min (95%) AHI 3.1    Significant tests/ events reviewed ONO on CPAP/RA 12/09/20  Total duration of test time was 8 hours and 17 minutes, average oxygen saturation was 90%, nadir was 83%, time below or at 88% saturation was 112 minutes.    NPSG 11/2014 AHI 5.8/hour supine AHI 16/hour, lowest desaturation 77%. 01/2015 CPAP titration CPAP titrated up to 12 cm, central apneas emerged with persistent desaturations  Spirometry 02/2014 normal, FEV1 81%, FVC 82%  OV 03/18/2021  Subjective:  Patient ID: [name], female , DOB: [birth year] , age 41 y.o. , MRN: [id number] , ADDRESS: [address] [place] [address] PCP Midge Minium, MD Patient Care Team: Midge Minium, MD as PCP - General Midge Minium, MD Megan Salon, MD as Consulting Physician (Gynecology) Jettie Booze, MD as Consulting Physician (Cardiology) Leta Baptist, MD as Consulting Physician (Otolaryngology) Brayton Caves, MD as Referring Physician (Psychiatry) Janeth Rase, NP as Nurse Practitioner (Adult Health Nurse Practitioner) Darlyne Russian, LCSW as Social Worker (Licensed Clinical Social Worker) Star Age, MD as Attending Physician (Neurology) [name], [facility] as Pharmacist (Pharmacist)  This Provider for this visit: Treatment Team:  Attending Provider: Brand Males, MD    03/18/2021 -   Chief Complaint  Patient presents with  . Follow-up    PFT performed today. Pt  states she has been doing okay since last visit and denies any complaints.   Transfer of care from Dr. Elsworth Soho to Dr. Chase Caller.  History is provided by the patient and also review of the records  HPI [name] 65 y.o. -I am meeting the patient for the first time.  According to the review of the records and also history provided by the patient a few months ago she had overnight pulse oximetry's test with his CPAP on.  This was all ordered by her sleep specialist.  During this time nocturnal desaturation was noted and she was referred here.  She feels the results are not accurate because she was restless during the night did not have sleep quality that was good.  She feels anxiety played a role.  She therefore does not want to use nocturnal oxygen.  Chest x-ray is reported as clear from March 2022 other than linear scarring in the right middle and left base.  [She has obesity].  She currently uses Flovent although she prefers Qvar.  I asked her why and she said that she feels the Flovent not controlling her symptoms quite well.  In talking to her it appears that there is no nocturnal awakening with cough or chest tightness or wheezing.  There is also no dyspnea  on exertion but she does notice that when she takes a deep breath and exhale she does have wheezing.  Sometimes she will have chest chest tightness as well.  Of note she has had history of asthma for many years.  Does not know if there is allergic asthma.  She has had allergy testing many decades ago and she does not remember the results.  She used to see Dr Gwenette Greet for cough many years ago.  Review of her pulmonary function test shows a 15% bronchodilator response and a ratio that shows obstructive pattern.  Her residual volume and total lung capacity border on air trapping and hyperinflation.  Her medications include Flovent, Singulair, Flonase  FENO: 34ppb and borderline  PFT  PFT Results Latest Ref Rng & Units 03/18/2021  FVC-Pre L 3.48   FVC-Predicted Pre % 90  FVC-Post L 3.63  FVC-Predicted Post % 94  Pre FEV1/FVC % % 71  Post FEV1/FCV % % 79  FEV1-Pre L 2.46  FEV1-Predicted Pre % 83  FEV1-Post L 2.86  DLCO uncorrected ml/min/mmHg 20.17  DLCO UNC% % 85  DLCO corrected ml/min/mmHg 20.17  DLCO COR %Predicted % 85  DLVA Predicted % 83  TLC L 6.61  TLC % Predicted % 113  RV % Predicted % 135       has a past medical history of Allergic rhinitis, Anxiety, Arthritis, Asthma, Bipolar disorder (HCC), Cataract, Complication of anesthesia, Constipation, chronic, Depression, Diabetes mellitus without complication (HCC), Dysrhythmia, GERD (gastroesophageal reflux disease), Heart murmur, Hyperlipidemia, Hypertension, MVP (mitral valve prolapse), and Urinary tract infection.   reports that she has never smoked. She has never used smokeless tobacco.  Past Surgical History:  Procedure Laterality Date  . ABDOMINAL HYSTERECTOMY  2002   no oophorectomy  . BACK SURGERY  2001   L5 - S1  . CATARACT EXTRACTION    . COLONOSCOPY    . NASAL SINUS SURGERY  01/2010   x2  . TONSILLECTOMY    . TOTAL KNEE ARTHROPLASTY  12/23/2011   Procedure: TOTAL KNEE ARTHROPLASTY;  Surgeon: Ninetta Lights, MD;  Location: [facility];  Service: Orthopedics;  Laterality: Right;  DR MURPHY WANTS 120 MINUTES FOR SURGERY  . TOTAL KNEE ARTHROPLASTY  08/31/2012   Procedure: TOTAL KNEE ARTHROPLASTY;  Surgeon: Ninetta Lights, MD;  Location: [facility];  Service: Orthopedics;  Laterality: Left;    Allergies  Allergen Reactions  . Nitrofurantoin Monohyd Macro Nausea Only    Severe headache  . Statins Other (See Comments)    Severe weakness, pain  . Sulfa Antibiotics Nausea And Vomiting  . Alprazolam Anxiety and Other (See Comments)    Hyper   . Amoxicillin Rash  . Prednisone Anxiety    Immunization History  Administered Date(s) Administered  . Influenza Split 09/02/2012  . Influenza,inj,Quad PF,6+ Mos 10/09/2013, 08/13/2015, 08/17/2016, 07/28/2017,  08/03/2018, 08/08/2019, 07/20/2020  . Moderna Sars-Covid-2 Vaccination 02/27/2019, 01/27/2020, 09/11/2020  . Pneumococcal Polysaccharide-23 09/02/2012  . Tdap 07/15/2012  . Zoster Recombinat (Shingrix) 11/10/2018, 06/09/2019    Family History  Problem Relation Age of Onset  . Coronary artery disease Father   . Heart attack Father 2  . Heart disease Father   . Lung disease Mother        Mycobacterium avium intracellulare  . Hypertension Brother   . Hyperlipidemia Brother   . Anesthesia problems Neg Hx      Current Outpatient Medications:  .  Alirocumab (PRALUENT) 150 MG/ML SOAJ, Inject 150 mg into the skin every 14 (fourteen)  days., Disp: 6 mL, Rfl: 3 .  ALPHAGAN P 0.1 % SOLN, Place 1 drop into both eyes every 8 (eight) hours. , Disp: , Rfl:  .  azelastine (ASTELIN) 0.1 % nasal spray, PLACE 2 SPRAYS INTO EACH NOSTRIL TWO TIMES A DAY AS DIRECTED, Disp: 30 mL, Rfl: 2 .  Cholecalciferol (VITAMIN D3) 50 MCG (2000 UT) CAPS, Take 2,000 Units by mouth daily., Disp: , Rfl:  .  clonazePAM (KLONOPIN) 2 MG tablet, 1 mg 2 (two) times daily. , Disp: , Rfl: 0 .  Cyanocobalamin (VITAMIN B-12 PO), Take by mouth., Disp: , Rfl:  .  fenofibrate (TRICOR) 145 MG tablet, Take 1 tablet (145 mg total) by mouth daily., Disp: 90 tablet, Rfl: 1 .  fexofenadine (ALLEGRA) 180 MG tablet, Take 180 mg by mouth daily., Disp: , Rfl:  .  fluticasone (FLONASE) 50 MCG/ACT nasal spray, SPRAY TWO SPRAYS IN EACH NOSTRIL ONCE DAILY, Disp: 16 g, Rfl: 3 .  fluticasone (FLOVENT HFA) 110 MCG/ACT inhaler, Inhale 2 puffs into the lungs in the morning and at bedtime., Disp: 1 each, Rfl: 6 .  gabapentin (NEURONTIN) 600 MG tablet, Take 1,200 mg by mouth 2 (two) times daily. , Disp: , Rfl:  .  HYDROcodone-acetaminophen (NORCO/VICODIN) 5-325 MG tablet, Take 1 tablet by mouth every 6 (six) hours as needed for moderate pain., Disp: 20 tablet, Rfl: 0 .  lamoTRIgine (LAMICTAL) 200 MG tablet, Take 200 mg by mouth 2 (two) times daily.,  Disp: , Rfl:  .  LUMIGAN 0.01 % SOLN, Place 1 drop into both eyes at bedtime. , Disp: , Rfl:  .  methocarbamol (ROBAXIN) 500 MG tablet, Take 1 tablet (500 mg total) by mouth every 8 (eight) hours as needed., Disp: 30 tablet, Rfl: 0 .  metoprolol succinate (TOPROL-XL) 50 MG 24 hr tablet, TAKE ONE TABLET BY MOUTH DAILY WITH A MEAL, Disp: 90 tablet, Rfl: 2 .  montelukast (SINGULAIR) 10 MG tablet, Take 1 tablet (10 mg total) by mouth at bedtime., Disp: 90 tablet, Rfl: 1 .  Multiple Vitamin (MULTIVITAMIN WITH MINERALS) TABS tablet, Take 1 tablet by mouth daily. , Disp: , Rfl:  .  OLANZapine (ZYPREXA) 10 MG tablet, Take 10 mg by mouth daily as needed., Disp: , Rfl:  .  omega-3 acid ethyl esters (LOVAZA) 1 g capsule, Take 2 capsules (2 g total) by mouth 2 (two) times daily., Disp: 360 capsule, Rfl: 3 .  ondansetron (ZOFRAN ODT) 4 MG disintegrating tablet, Take 1 tablet (4 mg total) by mouth every 8 (eight) hours as needed for nausea or vomiting., Disp: 20 tablet, Rfl: 0 .  Pyridoxine HCl (VITAMIN B-6 PO), Take by mouth., Disp: , Rfl:  .  QUEtiapine (SEROQUEL XR) 400 MG 24 hr tablet, Take one tablet every evening at bedtime, Disp: , Rfl: 0 .  QUEtiapine (SEROQUEL) 50 MG tablet, As needed, Disp: , Rfl: 3 .  venlafaxine (EFFEXOR-XR) 75 MG 24 hr capsule, Take 225 mg by mouth daily., Disp: , Rfl:  .  meloxicam (MOBIC) 15 MG tablet, Take 1 tablet (15 mg total) by mouth daily., Disp: 30 tablet, Rfl: 3      Objective:   Vitals:   03/18/21 1417  BP: 116/74  Pulse: 73  Temp: 98.5 F (36.9 C)  TempSrc: Temporal  SpO2: 96%  Weight: 222 lb 12.8 oz (101.1 kg)  Height: 5\' 9"  (1.753 m)    Estimated body mass index is 32.9 kg/m as calculated from the following:   Height as of this encounter:  5\' 9"  (1.753 m).   Weight as of this encounter: 222 lb 12.8 oz (101.1 kg).  @WEIGHTCHANGE @  [employer]   03/18/21 1417  Weight: 222 lb 12.8 oz (101.1 kg)     Physical Exam General: No distress.  obese Neuro: Alert and Oriented x 3. GCS 15. Speech normal Psych: Pleasant Resp:  Barrel Chest - no.  Wheeze - no, Crackles - no, No overt respiratory distress CVS: Normal heart sounds. Murmurs - no Ext: Stigmata of Connective Tissue Disease - no HEENT: Normal upper airway. PEERL +. No post nasal drip        Assessment:       ICD-10-CM   1. Poorly controlled mild persistent asthma  J45.30 Nitric oxide       Plan:     Patient Instructions     ICD-10-CM   1. Poorly controlled mild persistent asthma  J45.30     Evidence of the breathing test and his symptoms suggest that asthma is not under good control  Plan - do FENO test 03/18/2021 - Do blood work CBC with differential, blood IgE and RAST allergy panel -Stop Flovent - Start Symbicort 160/4.5, 2 puff 2 times daily scheduled [if it is too expensive call us] - Continue using albuterol as needed -Hold off any CT scan at this point -Hold off retesting for nocturnal oxygen at this point [do respect your desire not to use nocturnal oxygen at this point]  Follow-up - Return in 4 weeks to review results and also progress with Symbicort  -ACT questionnaire at follow-up      SIGNATURE    Dr. Brand Males, M.D., F.C.C.P,  Pulmonary and Critical Care Medicine Staff Physician, [place] Director - Interstitial Lung Disease  Program  [facility] [place] [facility] [place], [place], [address]  Pager: [phone], If no answer or between  15:00h - 7:00h: call [phone] Telephone: [phone]  2:48 PM 03/18/2021

## 2021-03-18 NOTE — Progress Notes (Signed)
PFT done today. 

## 2021-03-20 ENCOUNTER — Telehealth: Payer: Self-pay | Admitting: Internal Medicine

## 2021-03-20 LAB — RESPIRATORY ALLERGY PROFILE REGION II ~~LOC~~

## 2021-03-20 LAB — INTERPRETATION:

## 2021-03-20 NOTE — Telephone Encounter (Signed)
She can price out [employer], [employer] inhaler, Advair HFA, Breo OR we can place this for her and let us know.  The dosage will be the medium dosage in the range of products

## 2021-03-20 NOTE — Telephone Encounter (Signed)
Spoke with pt who states Symbicort is too expensive. Pt would like to if Dr. Chase Caller had a recommendation for a less expensive.  Dr. Chase Caller please advise.

## 2021-03-20 NOTE — Telephone Encounter (Signed)
Spoke with pt and gave her name of recommended inhalers from Dr. Chase Caller. Pt stated she will contact insurance company and then let the office know which one she would like to try. Will leave encounter open till pt responses with choice of inhaler.

## 2021-03-21 NOTE — Telephone Encounter (Signed)
Patient stated that she contacted insurance and was advised that symbicort is covered with a 45 dollar co pay.  She plans to pickup Rx today.  Nothing further needed at this time.

## 2021-03-21 NOTE — Telephone Encounter (Signed)
Patient states insurance will cover the Symbicort inhaler. Pharmacy is [facility] Patient phone number is [phone].

## 2021-03-24 NOTE — Telephone Encounter (Signed)
Seems like encounter was open in error so closing encounter.  

## 2021-03-26 DIAGNOSIS — M5416 Radiculopathy, lumbar region: Secondary | ICD-10-CM | POA: Diagnosis not present

## 2021-03-26 DIAGNOSIS — Z6834 Body mass index (BMI) 34.0-34.9, adult: Secondary | ICD-10-CM | POA: Diagnosis not present

## 2021-03-26 DIAGNOSIS — G4733 Obstructive sleep apnea (adult) (pediatric): Secondary | ICD-10-CM | POA: Diagnosis not present

## 2021-03-26 DIAGNOSIS — R030 Elevated blood-pressure reading, without diagnosis of hypertension: Secondary | ICD-10-CM | POA: Diagnosis not present

## 2021-03-31 ENCOUNTER — Telehealth: Payer: Self-pay | Admitting: Family Medicine

## 2021-03-31 NOTE — Telephone Encounter (Signed)
Pt needs to schedule an DM ck with Birdie Riddle

## 2021-04-04 ENCOUNTER — Ambulatory Visit: Payer: Medicare HMO | Admitting: Family Medicine

## 2021-04-09 ENCOUNTER — Encounter: Payer: Self-pay | Admitting: Physician Assistant

## 2021-04-09 ENCOUNTER — Ambulatory Visit (INDEPENDENT_AMBULATORY_CARE_PROVIDER_SITE_OTHER): Payer: Medicare HMO | Admitting: Family Medicine

## 2021-04-09 ENCOUNTER — Encounter: Payer: Self-pay | Admitting: Family Medicine

## 2021-04-09 ENCOUNTER — Telehealth: Payer: Self-pay

## 2021-04-09 ENCOUNTER — Ambulatory Visit (INDEPENDENT_AMBULATORY_CARE_PROVIDER_SITE_OTHER): Payer: Medicare HMO | Admitting: Physician Assistant

## 2021-04-09 ENCOUNTER — Other Ambulatory Visit: Payer: Self-pay

## 2021-04-09 VITALS — BP 137/89 | HR 76 | Ht 69.0 in | Wt 222.0 lb

## 2021-04-09 VITALS — BP 121/80 | HR 66 | Temp 99.7°F | Resp 17 | Ht 69.0 in | Wt 222.6 lb

## 2021-04-09 DIAGNOSIS — G4733 Obstructive sleep apnea (adult) (pediatric): Secondary | ICD-10-CM

## 2021-04-09 DIAGNOSIS — F3160 Bipolar disorder, current episode mixed, unspecified: Secondary | ICD-10-CM | POA: Diagnosis not present

## 2021-04-09 DIAGNOSIS — I10 Essential (primary) hypertension: Secondary | ICD-10-CM

## 2021-04-09 DIAGNOSIS — F411 Generalized anxiety disorder: Secondary | ICD-10-CM | POA: Diagnosis not present

## 2021-04-09 DIAGNOSIS — R69 Illness, unspecified: Secondary | ICD-10-CM | POA: Diagnosis not present

## 2021-04-09 DIAGNOSIS — E119 Type 2 diabetes mellitus without complications: Secondary | ICD-10-CM

## 2021-04-09 DIAGNOSIS — E782 Mixed hyperlipidemia: Secondary | ICD-10-CM

## 2021-04-09 DIAGNOSIS — E559 Vitamin D deficiency, unspecified: Secondary | ICD-10-CM | POA: Diagnosis not present

## 2021-04-09 MED ORDER — HYDROXYZINE HCL 25 MG PO TABS: 12.5000 mg | ORAL_TABLET | Freq: Three times a day (TID) | ORAL | 1 refills | Status: DC | PRN

## 2021-04-09 MED ORDER — QUETIAPINE FUMARATE ER 300 MG PO TB24: 600.0000 mg | ORAL_TABLET | Freq: Every day | ORAL | 1 refills | Status: DC

## 2021-04-09 NOTE — Telephone Encounter (Signed)
Patient would like to know if [name] would suggest her to get the 4th booster.

## 2021-04-09 NOTE — Telephone Encounter (Signed)
yes

## 2021-04-09 NOTE — Progress Notes (Signed)
[facility] MD/PA/NP Initial Note  04/09/2021 1:57 PM [name]  MRN:  [id number]  Chief Complaint:  Chief Complaint    Establish Care      HPI:   Has been seeing provider [name] for a long time. Wants to have a 'fresh set of eyes' on her issues. Has had anxiety all her life.  Has been on Seroquel to help with anxiety, sleep, and mood, at 600 mg but with an extra 25 mg for sleep as needed.  Does not feel like it is working well anymore.  For a while, she is unable to give specifics, she has experienced grandiosity, increased energy without needing as much sleep, racing thoughts, easy distractions and spending more money.  Denies risky behaviors, no paranoia, no hallucinations.  Patient denies loss of interest in usual activities and is able to enjoy things.  Denies decreased energy or motivation.  Appetite has not changed.  No extreme sadness, tearfulness, or feelings of hopelessness.  Denies any changes in concentration, making decisions or remembering things.  Denies suicidal or homicidal thoughts.  She has more of a generalized sense of anxiety, of unease like something bad is going to happen at any time.  She feels jittery inside a lot of the time.  Not really having panic attacks but feels like she could.  She has had them before and knows what they feel like.  Visit Diagnosis:    ICD-10-CM   1. Mixed bipolar I disorder (HCC)  F31.60   2. Generalized anxiety disorder  F41.1   3. Obstructive sleep apnea  G47.33     Past Psychiatric History:   Had manic episode in the late 80s, shopped a lot. Can't remember other sx.   Hospitalized approximately 20 years ago, she thinks was d/t xanax. Was in a 'catatonic state' so had to be admitted.  No suicide attempts. Never cut/burned herself. No Eating d/o. Bipolar d/o dx in 1989.  Past medications for mental health diagnoses include: Latuda, Clomiprimine, Prozac, Effexor, Seroquel, Zyprexa, Risperdal had terrible drowsiness, Xanax,  Klonopin, Ativan, Buspar, Wellbutrin  Past Medical History:  Past Medical History:  Diagnosis Date  . Allergic rhinitis   . Anxiety   . Arthritis    R knee, Left Knee  . Asthma   . Bipolar disorder ([place])   . Cataract   . Complication of anesthesia    had bad anxiety after anesthesia  . Constipation, chronic   . Depression   . Diabetes mellitus without complication ([place])   . Dysrhythmia    Palpations occ, takes Tenormin  . GERD (gastroesophageal reflux disease)   . Heart murmur   . Hyperlipidemia   . Hypertension   . MVP (mitral valve prolapse)   . Urinary tract infection    frequent, see Urololgist 12/16/2010    Past Surgical History:  Procedure Laterality Date  . ABDOMINAL HYSTERECTOMY  2002   no oophorectomy  . BACK SURGERY  2001   L5 - S1  . CATARACT EXTRACTION    . COLONOSCOPY    . NASAL SINUS SURGERY  01/2010   x2  . TONSILLECTOMY    . TOTAL KNEE ARTHROPLASTY  12/23/2011   Procedure: TOTAL KNEE ARTHROPLASTY;  Surgeon: Ninetta Lights, MD;  Location: [facility];  Service: Orthopedics;  Laterality: Right;  DR MURPHY WANTS 120 MINUTES FOR SURGERY  . TOTAL KNEE ARTHROPLASTY  08/31/2012   Procedure: TOTAL KNEE ARTHROPLASTY;  Surgeon: Ninetta Lights, MD;  Location: [facility];  Service: Orthopedics;  Laterality: Left;    Family Psychiatric History: see below   Family History:  Family History  Problem Relation Age of Onset  . Coronary artery disease Father   . Heart attack Father 59  . Heart disease Father   . Lung disease Mother        Mycobacterium avium intracellulare  . Hypertension Brother   . Hyperlipidemia Brother   . Alcohol abuse Brother   . Anxiety disorder Brother   . Stroke Maternal Grandfather   . Diabetes Maternal Grandmother   . Stroke Maternal Grandmother   . Heart attack Paternal Grandmother   . Healthy Brother   . Anesthesia problems Neg Hx     Social History:  Social History   Socioeconomic History  . Marital status: Single    Spouse name:  Not on file  . Number of children: 0  . Years of education: 48  . Highest education level: Some college, no degree  Occupational History  . 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 Occupation: Designer, industrial/product  Tobacco Use  . Smoking status: Never Smoker  . Smokeless tobacco: Never Used  Vaping Use  . Vaping Use: Never used  Substance and Sexual Activity  . Alcohol use: No    Alcohol/week: 0.0 standard drinks    Comment: once a year maybe  . Drug use: No  . Sexual activity: Not Currently    Birth control/protection: Abstinence  Other Topics Concern  . Not on file  Social History Narrative   Divorced. No kids. Grew up in [place]. Had a great childhood. Has 2 older brothers.   Mom was a stay at home mom. Her dad worked at [employer] as a Banker. He died when pt was 65yo.    Never abused.       Disabled d/t Bipolar since 1992.   Religion-Christian   No legal issues.    Lives alone. Has 1 dog. Has pet sitting business.   2 cups of coffee a day.   Social Determinants of Health   Financial Resource Strain: Low Risk   . Difficulty of Paying Living Expenses: Not very hard  Food Insecurity: No Food Insecurity  . Worried About Charity fundraiser in the Last Year: Never true  . Ran Out of Food in the Last Year: Never true  Transportation Needs: No Transportation Needs  . Lack of Transportation (Medical): No  . Lack of Transportation (Non-Medical): No  Physical Activity: Sufficiently Active  . Days of Exercise per Week: 7 days  . Minutes of Exercise per Session: 30 min  Stress: Stress Concern Present  . Feeling of Stress : Rather much  Social Connections: Socially Isolated  . Frequency of Communication with Friends and Family: Twice a week  . Frequency of Social Gatherings with Friends and Family: Never  . Attends Religious Services: 1 to 4 times per year  . Active Member of Clubs or Organizations: No  . Attends Archivist Meetings:  Never  . Marital Status: Divorced    Allergies:  Allergies  Allergen Reactions  . Nitrofurantoin Monohyd Macro Nausea Only    Severe headache  . Statins Other (See Comments)    Severe weakness, pain  . Sulfa Antibiotics Nausea And Vomiting  . Alprazolam Anxiety and Other (See Comments)    Hyper   . Amoxicillin Rash  . Prednisone Anxiety    Metabolic Disorder Labs: Lab Results  Component Value Date   HGBA1C 7.4 (H) 04/09/2021  No results found for: PROLACTIN Lab Results  Component Value Date   CHOL 148 04/09/2021   TRIG 189.0 (H) 04/09/2021   HDL 66.30 04/09/2021   CHOLHDL 2 04/09/2021   VLDL 37.8 04/09/2021   LDLCALC 44 04/09/2021   LDLCALC 28 08/09/2020   Lab Results  Component Value Date   TSH 1.29 04/09/2021   TSH 1.32 08/09/2020    Therapeutic Level Labs: No results found for: LITHIUM No results found for: VALPROATE No components found for:  CBMZ  Current Medications: Current Outpatient Medications  Medication Sig Dispense Refill  . Alirocumab (PRALUENT) 150 MG/ML SOAJ Inject 150 mg into the skin every 14 (fourteen) days. 6 mL 3  . ALPHAGAN P 0.1 % SOLN Place 1 drop into both eyes every 8 (eight) hours.     Marland Kitchen azelastine (ASTELIN) 0.1 % nasal spray PLACE 2 SPRAYS INTO EACH NOSTRIL TWO TIMES A DAY AS DIRECTED 30 mL 2  . budesonide-formoterol (SYMBICORT) 160-4.5 MCG/ACT inhaler Inhale 2 puffs into the lungs in the morning and at bedtime. 10.2 each 6  . Cholecalciferol (VITAMIN D3) 50 MCG (2000 UT) CAPS Take 2,000 Units by mouth daily.    . clonazePAM (KLONOPIN) 2 MG tablet 1 mg 2 (two) times daily.   0  . Cyanocobalamin (VITAMIN B-12 PO) Take by mouth.    . fenofibrate (TRICOR) 145 MG tablet Take 1 tablet (145 mg total) by mouth daily. 90 tablet 1  . fexofenadine (ALLEGRA) 180 MG tablet Take 180 mg by mouth daily.    . fluticasone (FLONASE) 50 MCG/ACT nasal spray SPRAY TWO SPRAYS IN EACH NOSTRIL ONCE DAILY 16 g 3  . gabapentin (NEURONTIN) 600 MG tablet Take  1,200 mg by mouth 2 (two) times daily.     . hydrOXYzine (ATARAX/VISTARIL) 25 MG tablet Take 0.5-1 tablets (12.5-25 mg total) by mouth every 8 (eight) hours as needed. 60 tablet 1  . lamoTRIgine (LAMICTAL) 200 MG tablet Take 200 mg by mouth 2 (two) times daily.    . meloxicam (MOBIC) 15 MG tablet Take 1 tablet (15 mg total) by mouth daily. 30 tablet 3  . methocarbamol (ROBAXIN) 500 MG tablet Take 1 tablet (500 mg total) by mouth every 8 (eight) hours as needed. 30 tablet 0  . metoprolol succinate (TOPROL-XL) 50 MG 24 hr tablet TAKE ONE TABLET BY MOUTH DAILY WITH A MEAL 90 tablet 2  . montelukast (SINGULAIR) 10 MG tablet Take 1 tablet (10 mg total) by mouth at bedtime. 90 tablet 1  . Multiple Vitamin (MULTIVITAMIN WITH MINERALS) TABS tablet Take 1 tablet by mouth daily.     Marland Kitchen omega-3 acid ethyl esters (LOVAZA) 1 g capsule TAKE TWO CAPSULES BY MOUTH TWICE A DAY 120 capsule 0  . Pyridoxine HCl (VITAMIN B-6 PO) Take by mouth.    . QUEtiapine (SEROQUEL XR) 300 MG 24 hr tablet Take 2 tablets (600 mg total) by mouth at bedtime. 60 tablet 1  . UNABLE TO FIND CPAP    . venlafaxine (EFFEXOR-XR) 75 MG 24 hr capsule Take 75 mg by mouth daily. 2 po q am, 1 po q afternoon    . HYDROcodone-acetaminophen (NORCO/VICODIN) 5-325 MG tablet Take 1 tablet by mouth every 6 (six) hours as needed for moderate pain. (Patient not taking: No sig reported) 20 tablet 0  . LUMIGAN 0.01 % SOLN Place 1 drop into both eyes at bedtime.     . ondansetron (ZOFRAN ODT) 4 MG disintegrating tablet Take 1 tablet (4 mg total) by mouth every  8 (eight) hours as needed for nausea or vomiting. 20 tablet 0   No current facility-administered medications for this visit.    Medication Side Effects: none  Orders placed this visit:  No orders of the defined types were placed in this encounter.   Psychiatric Specialty Exam:  Review of Systems  Constitutional: Negative.   HENT: Negative.   Eyes: Negative.   Respiratory: Negative.    Cardiovascular: Positive for palpitations.       When anxious. No CP  Gastrointestinal: Negative.   Endocrine: Negative.   Genitourinary: Negative.   Musculoskeletal: Positive for back pain and neck pain.  Skin: Negative.   Allergic/Immunologic: Negative.   Neurological: Positive for headaches.  Psychiatric/Behavioral: The patient is nervous/anxious.     Blood pressure 137/89, pulse 76, height 5\' 9"  (1.753 m), weight 222 lb (100.7 kg), last menstrual period 11/09/2000.Body mass index is 32.78 kg/m.  General Appearance: Casual and Well Groomed  Eye Contact:  Good  Speech:  Clear and Coherent and Normal Rate  Volume:  Normal  Mood:  Euthymic  Affect:  Appropriate  Thought Process:  Goal Directed and Descriptions of Associations: Circumstantial  Orientation:  Full (Time, Place, and Person)  Thought Content: Logical   Suicidal Thoughts:  No  Homicidal Thoughts:  No  Memory:  WNL  Judgement:  Good  Insight:  Good  Psychomotor Activity:  Normal  Concentration:  Concentration: Good  Recall:  Good  Fund of Knowledge: Good  Language: Good  Assets:  Desire for Improvement  ADL's:  Intact  Cognition: WNL  Prognosis:  Good   Labs 04/09/2021 Vitamin D 20 CBC with differential was normal. CMP normal except glucose 115 Hemoglobin A1c 7.4 LFTs were normal TSH normal Lipid panel triglycerides of 189 otherwise normal   Screenings:  GAD-7   Flowsheet Row Office Visit from 04/09/2021 in [facility]  Total GAD-7 Score 11    Mini-Mental   Flowsheet Row Clinical Support from 08/08/2019 in [facility] from 08/03/2018 in [facility] from 08/13/2015 in [facility] at [facility]  Total Score (max 30 points ) 30 29 28     PHQ2-9   Zinc Visit from 04/09/2021 in [facility] Most recent  reading at 04/09/2021  2:28 PM Office Visit from 04/09/2021 in [facility] Most recent reading at 04/09/2021 11:04 AM Video Visit from 03/17/2021 in [facility] Most recent reading at 03/17/2021 12:37 PM Office Visit from 12/17/2020 in [facility] Most recent reading at 12/17/2020  9:39 AM Office Visit from 11/05/2020 in [facility] Most recent reading at 11/05/2020  8:33 AM  PHQ-2 Total Score 0 1 0 0 4  PHQ-9 Total Score 0 -- 0 0 8      Receiving Psychotherapy: Yes  [facility]   Treatment Plan/Recommendations:  PDMP reviewed.  I provided 60 minutes of face to face time during this encounter, including time spent before and after the visit in records review, medical decision making, and charting.  We discussed her symptoms, diagnosis, and treatment plan.  She seems to have responded to Seroquel for quite a while but I believe the current dose is no longer effective.  Recommend increasing that which will help with mood, sleep, and anxiety.  She understands and would like to try that. She has klonopin already, doesn't know if it does much of  anything.  Continue Klonopin 2 mg po qd prn (per different provider.) Increase Seroquel XR to 300 mg, 2 p.o. nightly. Continue Effexor XR 75 mg, 3 p.o. daily. Continue Lamictal 200 mg, 1 p.o. twice daily. Start hydroxyzine 25 mg, 1/2-1 3 times daily as needed. Continue gabapentin 600 mg, 2 po bid. Continue therapy. Return in 2 months.  Donnal Moat, PA-C

## 2021-04-09 NOTE — Assessment & Plan Note (Signed)
Pt has hx of this.  She again notes fatigue.  Check levels and replete prn.

## 2021-04-09 NOTE — Patient Instructions (Signed)
Stop the Zyprexa. Stop the Seroquel 50 mg that she take as needed. We are increasing the Seroquel XR to a total of 600 mg. We are adding hydroxyzine 25 mg he can take 1/2 to 1 pill 3 times a day as needed for anxiety. Continue all other medications at this time.

## 2021-04-09 NOTE — Progress Notes (Signed)
   Subjective:    Patient ID: [name], female    DOB: 04/29/1956, 65 y.o.   MRN: [id number]  HPI DM- currently diet controlled.  Last A1C 7%.  UTD on eye exam, due for foot exam, due for microalbumin.  + fatigue.  No CP, SOB, visual changes, edema.  + HAs.  Denies numbness/tingling of hands/feet.  Pt returns to the gym tomorrow and plans to focus on low carb diet.  Hyperlipidemia- chronic problem, on Fenofibrate 145mg .  No abd pain, N/V.  HTN- chronic problem. On Metoprolol 50mg  daily.  Denies CP, SOB, visual changes, edema.   Review of Systems For ROS see HPI   This visit occurred during the SARS-CoV-2 public health emergency.  Safety protocols were in place, including screening questions prior to the visit, additional usage of staff PPE, and extensive cleaning of exam room while observing appropriate contact time as indicated for disinfecting solutions.       Objective:   Physical Exam Vitals reviewed.  Constitutional:      General: She is not in acute distress.    Appearance: Normal appearance. She is well-developed. She is obese.  HENT:     Head: Normocephalic and atraumatic.  Eyes:     Conjunctiva/sclera: Conjunctivae normal.     Pupils: Pupils are equal, round, and reactive to light.  Neck:     Thyroid: No thyromegaly.  Cardiovascular:     Rate and Rhythm: Normal rate and regular rhythm.     Pulses: Normal pulses.     Heart sounds: Normal heart sounds. No murmur heard.   Pulmonary:     Effort: Pulmonary effort is normal. No respiratory distress.     Breath sounds: Normal breath sounds.  Abdominal:     General: There is no distension.     Palpations: Abdomen is soft.     Tenderness: There is no abdominal tenderness.  Musculoskeletal:     Cervical back: Normal range of motion and neck supple.     Right lower leg: No edema.     Left lower leg: No edema.  Lymphadenopathy:     Cervical: No cervical adenopathy.  Skin:    General: Skin is warm and dry.   Neurological:     General: No focal deficit present.     Mental Status: She is alert and oriented to person, place, and time.  Psychiatric:        Mood and Affect: Mood normal.        Behavior: Behavior normal.        Thought Content: Thought content normal.           Assessment & Plan:

## 2021-04-09 NOTE — Assessment & Plan Note (Signed)
Ongoing issue for pt.  UTD on eye exam.  Foot exam done today.  Check microalbumin.  Encouraged healthy diet and regular exercise.  Check labs and determine if medication is needed.

## 2021-04-09 NOTE — Assessment & Plan Note (Signed)
Chronic problem, on Metoprolol 50mg  daily w/ good control.  Currently asymptomatic.  Will follow.

## 2021-04-09 NOTE — Assessment & Plan Note (Signed)
Chronic problem.  Tolerating Fenofibrate w/o difficulty.  Has been intolerant to statins and Zetia in the past.  Check labs.  Adjust meds prn

## 2021-04-09 NOTE — Telephone Encounter (Signed)
Called patient with pcp recommendations. Patient voiced understanding. 

## 2021-04-09 NOTE — Patient Instructions (Addendum)
Schedule your complete physical for October We'll notify you of your lab results and make any changes if needed Continue to work on healthy diet and regular exercise- you can do it! Call with any questions or concerns Stay Safe!  Stay Healthy! Have a great summer!!!

## 2021-04-10 LAB — CBC WITH DIFFERENTIAL/PLATELET
Basophils Absolute: 0 10*3/uL (ref 0.0–0.1)
Basophils Relative: 0.3 % (ref 0.0–3.0)
Eosinophils Absolute: 0 10*3/uL (ref 0.0–0.7)
Eosinophils Relative: 0.2 % (ref 0.0–5.0)
HCT: 36.9 % (ref 36.0–46.0)
Hemoglobin: 12.6 g/dL (ref 12.0–15.0)
Lymphocytes Relative: 37.3 % (ref 12.0–46.0)
Lymphs Abs: 1.6 10*3/uL (ref 0.7–4.0)
MCHC: 34.1 g/dL (ref 30.0–36.0)
MCV: 88.4 fl (ref 78.0–100.0)
Monocytes Absolute: 0.3 10*3/uL (ref 0.1–1.0)
Monocytes Relative: 7.5 % (ref 3.0–12.0)
Neutro Abs: 2.4 10*3/uL (ref 1.4–7.7)
Neutrophils Relative %: 54.7 % (ref 43.0–77.0)
Platelets: 220 10*3/uL (ref 150.0–400.0)
RBC: 4.17 Mil/uL (ref 3.87–5.11)
RDW: 13.6 % (ref 11.5–15.5)
WBC: 4.4 10*3/uL (ref 4.0–10.5)

## 2021-04-10 LAB — TSH: TSH: 1.29 u[IU]/mL (ref 0.35–4.50)

## 2021-04-10 LAB — VITAMIN D 25 HYDROXY (VIT D DEFICIENCY, FRACTURES): VITD: 20.18 ng/mL — ABNORMAL LOW (ref 30.00–100.00)

## 2021-04-10 LAB — HEMOGLOBIN A1C: Hgb A1c MFr Bld: 7.4 % — ABNORMAL HIGH (ref 4.6–6.5)

## 2021-04-11 LAB — LIPID PANEL
Cholesterol: 148 mg/dL (ref 0–200)
HDL: 66.3 mg/dL (ref >39.00)
LDL Cholesterol: 44 mg/dL (ref 0–99)
NonHDL: 81.83
Total CHOL/HDL Ratio: 2
Triglycerides: 189 mg/dL — ABNORMAL HIGH (ref 0.0–149.0)
VLDL: 37.8 mg/dL (ref 0.0–40.0)

## 2021-04-11 LAB — HEPATIC FUNCTION PANEL
ALT: 20 U/L (ref 0–35)
AST: 18 U/L (ref 0–37)
Albumin: 5 g/dL (ref 3.5–5.2)
Alkaline Phosphatase: 29 U/L — ABNORMAL LOW (ref 39–117)
Bilirubin, Direct: 0.1 mg/dL (ref 0.0–0.3)
Total Bilirubin: 0.6 mg/dL (ref 0.2–1.2)
Total Protein: 7.2 g/dL (ref 6.0–8.3)

## 2021-04-11 LAB — BASIC METABOLIC PANEL
BUN: 16 mg/dL (ref 6–23)
CO2: 21 mEq/L (ref 19–32)
Calcium: 10 mg/dL (ref 8.4–10.5)
Chloride: 102 mEq/L (ref 96–112)
Creatinine, Ser: 0.67 mg/dL (ref 0.40–1.20)
GFR: 92.86 mL/min (ref >60.00)
Glucose, Bld: 115 mg/dL — ABNORMAL HIGH (ref 70–99)
Potassium: 4 mEq/L (ref 3.5–5.1)
Sodium: 142 mEq/L (ref 135–145)

## 2021-04-11 LAB — MICROALBUMIN / CREATININE URINE RATIO
Creatinine,U: 55 mg/dL
Microalb Creat Ratio: 1.3 mg/g (ref 0.0–30.0)
Microalb, Ur: <0.7 mg/dL (ref 0.0–1.9)

## 2021-04-14 ENCOUNTER — Other Ambulatory Visit: Payer: Self-pay

## 2021-04-14 MED ORDER — VITAMIN D (ERGOCALCIFEROL) 1.25 MG (50000 UNIT) PO CAPS: 50000.0000 [IU] | ORAL_CAPSULE | ORAL | 0 refills | Status: DC

## 2021-04-15 ENCOUNTER — Ambulatory Visit: Payer: Medicare HMO | Admitting: Internal Medicine

## 2021-04-18 DIAGNOSIS — G4733 Obstructive sleep apnea (adult) (pediatric): Secondary | ICD-10-CM | POA: Diagnosis not present

## 2021-04-21 ENCOUNTER — Other Ambulatory Visit: Payer: Self-pay

## 2021-04-21 ENCOUNTER — Encounter: Payer: Medicare HMO | Attending: Family Medicine | Admitting: Dietician

## 2021-04-21 DIAGNOSIS — E119 Type 2 diabetes mellitus without complications: Secondary | ICD-10-CM | POA: Diagnosis not present

## 2021-04-21 NOTE — Progress Notes (Signed)
Diabetes Self-Management Education  Visit Type: First/Initial  Appt. Start Time: 1540 Appt. End Time: 5027  04/21/2021  [name], identified by name and date of birth, is a 65 y.o. female with a diagnosis of Diabetes: Type 2.   ASSESSMENT Patient is here today alone.  She was last seen by this RD 11/28/2018.  She states that she was stress eating for a long time. She states that she has now decided to change. This is 2 weeks without candy bars.  She wants to control her blood sugar and avoid medication. She has started walking daily.  She is going to join [employer] (free with [employer]).  History includes:  Type 2 Diabetes, HTN, HLD, GERD, OSA on c-pap, constipation, GERD, bipolar. A1C 10% 03/24/2021 increased from 7% 08/09/2020  (was 5.8% 01/26/2019), GFR 92, cholesterol 148, HDL 66, LDL 44, Triglycerides 189, vitamin D 20 on 04/09/21 Medications includes vitamin D  Weight hx: 220 lbs 04/21/2021  222 lbs 04/07/2021 231 lbs January 2019 193 lbs 06/2018 207 lbs 11/22/2018  Patient lives with her dog.  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  She worked for [employer] prior to being downsized. She struggles with finances at times. She struggles with standing for long periods of time.   She has gone to a therapist but has not gone for a while.  She states that it helps when she talks with her cousin.  Being alone a lot causes her stress.  She states that she needs more structure. She enjoys playing guitar and singing.  She writes her own songs.  Weight 220 lb (99.8 kg), last menstrual period 11/09/2000. Body mass index is 32.49 kg/m.   Diabetes Self-Management Education - 04/21/21 1617       Visit Information   Visit Type First/Initial      Initial Visit   Diabetes Type Type 2    Are you currently following a meal plan? No    Are you taking your medications as prescribed? Not on Medications    Date Diagnosed 2018      Health Coping   How would you  rate your overall health? Good      Psychosocial Assessment   Patient Belief/Attitude about Diabetes Motivated to manage diabetes    Self-care barriers Lack of material resources    Self-management support Doctor's office    Other persons present Patient    Patient Concerns Nutrition/Meal planning    Special Needs None    Preferred Learning Style No preference indicated    Learning Readiness Ready    How often do you need to have someone help you when you read instructions, pamphlets, or other written materials from your doctor or pharmacy? 1 - Never    What is the last grade level you completed in school? 3 years college      Pre-Education Assessment   Patient understands the diabetes disease and treatment process. Needs Review    Patient understands incorporating nutritional management into lifestyle. Needs Review    Patient undertands incorporating physical activity into lifestyle. Needs Review    Patient understands using medications safely. Needs Review    Patient understands monitoring blood glucose, interpreting and using results Needs Review    Patient understands prevention, detection, and treatment of acute complications. Needs Review    Patient understands prevention, detection, and treatment of chronic complications. Needs Review    Patient understands how to develop strategies to address psychosocial issues. Needs Review  Patient understands how to develop strategies to promote health/change behavior. Needs Review      Complications   Last HgB A1C per patient/outside source 7.4 %   04/09/2021   How often do you check your blood sugar? 0 times/day (not testing)    Have you had a dilated eye exam in the past 12 months? Yes    Have you had a dental exam in the past 12 months? No    Are you checking your feet? No      Dietary Intake   Breakfast 2 packs instant oatmeal (unsweetened), blueberries, 1 pack artificial sweetener    Snack (morning) none    Lunch salad, [place]  with balsamic vinegrette    Snack (afternoon) occasional berries    Dinner pinto beans, brown rice, salsa    Snack (evening) pretzels    Beverage(s) water, half and half tea, rare diet soda      Exercise   Exercise Type Light (walking / raking leaves)    How many days per week to you exercise? 6    How many minutes per day do you exercise? 30    Total minutes per week of exercise 180      Patient Education   Previous Diabetes Education Yes (please comment)   11/2018   Disease state  Definition of diabetes, type 1 and 2, and the diagnosis of diabetes    Nutrition management  Meal options for control of blood glucose level and chronic complications.;Food label reading, portion sizes and measuring food.;Role of diet in the treatment of diabetes and the relationship between the three main macronutrients and blood glucose level    Physical activity and exercise  Role of exercise on diabetes management, blood pressure control and cardiac health.    Monitoring Identified appropriate SMBG and/or A1C goals.    Acute complications Taught treatment of hypoglycemia - the 15 rule.    Chronic complications Relationship between chronic complications and blood glucose control;Retinopathy and reason for yearly dilated eye exams    Psychosocial adjustment Worked with patient to identify barriers to care and solutions;Identified and addressed patients feelings and concerns about diabetes      Individualized Goals (developed by patient)   Nutrition General guidelines for healthy choices and portions discussed    Physical Activity Exercise 5-7 days per week;30 minutes per day    Medications take my medication as prescribed    Monitoring  test my blood glucose as discussed    Reducing Risk increase portions of healthy fats    Health Coping discuss diabetes with (comment)   MD, RD     Post-Education Assessment   Patient understands the diabetes disease and treatment process. Demonstrates understanding /  competency    Patient understands incorporating nutritional management into lifestyle. Needs Review    Patient undertands incorporating physical activity into lifestyle. Demonstrates understanding / competency    Patient understands using medications safely. Demonstrates understanding / competency    Patient understands monitoring blood glucose, interpreting and using results Demonstrates understanding / competency    Patient understands prevention, detection, and treatment of acute complications. Demonstrates understanding / competency    Patient understands prevention, detection, and treatment of chronic complications. Demonstrates understanding / competency    Patient understands how to develop strategies to address psychosocial issues. Demonstrates understanding / competency    Patient understands how to develop strategies to promote health/change behavior. Needs Review      Outcomes   Expected Outcomes Demonstrated interest in learning. Expect positive  outcomes    Future DMSE 2 months    Program Status Not Completed             Individualized Plan for Diabetes Self-Management Training:   Learning Objective:  Patient will have a greater understanding of diabetes self-management. Patient education plan is to attend individual and/or group sessions per assessed needs and concerns.   Plan:   Patient Instructions  Consider Steel cut oats (prepare with almond milk and water).  Easy to reheat.  Slow and steady release of energy.    Breakfast, lunch, and dinner daily. Aim to increase your fiber intake. 1/2 your plate should be non-starchy vegetables  Eat more Non-Starchy Vegetables These include greens, broccoli, cauliflower, cabbage, carrots, beets, eggplant, peppers, squash and others. Minimize added sugars and refined grains Rethink what you drink.  Choose beverages without added sugar.  Look for 0 carbs on the label. See the list of whole grains below.  Find alternatives to  usual sweet treats. Choose whole foods over processed. Make simple meals at home more often than eating out.  Tips to increase fiber in your diet: (All plants have fiber.  Eat a variety. There are more than are on this list.) Slowly increase the amount of fiber you eat to 25-35 grams per day.  (More is fine if you tolerate it.) Fiber from whole grains, nuts and seeds Quinoa, 1/2 cup = 5 grams Bulgur, 1/2 cup = 4.1 grams Popcorn, 3 cups = 3.6 grams Whole Wheat Spaghetti, 1/2 cup = 3.2 grams Barley, 1/2 cup = 3 grams Oatmeal, 1/2 cup = 2 grams Whole Wheat English Muffin = 3 grams Corn, 1/2 cup = 2.1 grams Brown Rice, 1/2 cup = 1.8 grams Flax seeds, 1 Tablespoon = 2.8 grams Chia seeds, 1 Tablespoon = 11 grams Almonds, 1 ounce = 3.5 grams fiber Fiber from legumes Kidney beans, 1/2 cup 7.9 grams Lentils, 1/2 cup = 7.8 grams Pinto beans, 1/2 cup = 7.7 grams Black beans, 1/2 cup = 7.6 grams Lima beans, 1/2 cup 6.4 grams Chick peas, 1/2 cup = 5.3 grams Black eyed peas, 1/2 cup = 4 grams Fiber from fruits and vegetables Pear, 6 grams Apple. 3.3 grams Raspberries or Blackberries, 3/4 cup = 6 grams Strawberries or Blueberries, 1 cup = 3.4 grams Baked sweet potato 3.8 grams fiber Baked potato with skin 4.4 grams  Peas, 1/2 cup = 4.4 grams  Spinach, 1/2 cup cooked = 3.5 grams  Avocado, 1/2 = 5 grams   Expected Outcomes:  Demonstrated interest in learning. Expect positive outcomes  Education material provided: ADA - How to Thrive: A Guide for Your Journey with Diabetes, Meal plan card, and Snack sheet  If problems or questions, patient to contact team via:  Phone  Future DSME appointment: 2 months

## 2021-04-21 NOTE — Patient Instructions (Addendum)
Consider Steel cut oats (prepare with almond milk and water).  Easy to reheat.  Slow and steady release of energy.    Breakfast, lunch, and dinner daily. Aim to increase your fiber intake. 1/2 your plate should be non-starchy vegetables  Eat more Non-Starchy Vegetables These include greens, broccoli, cauliflower, cabbage, carrots, beets, eggplant, peppers, squash and others. Minimize added sugars and refined grains Rethink what you drink.  Choose beverages without added sugar.  Look for 0 carbs on the label. See the list of whole grains below.  Find alternatives to usual sweet treats. Choose whole foods over processed. Make simple meals at home more often than eating out.  Tips to increase fiber in your diet: (All plants have fiber.  Eat a variety. There are more than are on this list.) Slowly increase the amount of fiber you eat to 25-35 grams per day.  (More is fine if you tolerate it.) Fiber from whole grains, nuts and seeds Quinoa, 1/2 cup = 5 grams Bulgur, 1/2 cup = 4.1 grams Popcorn, 3 cups = 3.6 grams Whole Wheat Spaghetti, 1/2 cup = 3.2 grams Barley, 1/2 cup = 3 grams Oatmeal, 1/2 cup = 2 grams Whole Wheat English Muffin = 3 grams Corn, 1/2 cup = 2.1 grams Brown Rice, 1/2 cup = 1.8 grams Flax seeds, 1 Tablespoon = 2.8 grams Chia seeds, 1 Tablespoon = 11 grams Almonds, 1 ounce = 3.5 grams fiber Fiber from legumes Kidney beans, 1/2 cup 7.9 grams Lentils, 1/2 cup = 7.8 grams Pinto beans, 1/2 cup = 7.7 grams Black beans, 1/2 cup = 7.6 grams Lima beans, 1/2 cup 6.4 grams Chick peas, 1/2 cup = 5.3 grams Black eyed peas, 1/2 cup = 4 grams Fiber from fruits and vegetables Pear, 6 grams Apple. 3.3 grams Raspberries or Blackberries, 3/4 cup = 6 grams Strawberries or Blueberries, 1 cup = 3.4 grams Baked sweet potato 3.8 grams fiber Baked potato with skin 4.4 grams  Peas, 1/2 cup = 4.4 grams  Spinach, 1/2 cup cooked = 3.5 grams  Avocado, 1/2 = 5 grams     ]

## 2021-04-23 DIAGNOSIS — R69 Illness, unspecified: Secondary | ICD-10-CM | POA: Diagnosis not present

## 2021-04-25 ENCOUNTER — Telehealth: Payer: Medicare HMO

## 2021-04-25 ENCOUNTER — Telehealth: Payer: Self-pay

## 2021-04-25 NOTE — Progress Notes (Signed)
Chronic Care Management Pharmacy Assistant   Name: [name]  MRN: [id number] DOB: 07-Dec-1955  Reason for Encounter: PAP Alphagan   Recent office visits:  04/09/21 Annye Asa MD (PCP)- Office visit for management of chronic conditions. Follow up as scheduled.  03/17/21 Annye Asa MD (PCP)- Video visit for back pain.  STARTED Methocarbamol 500mg  take 1 every 8 hours as needed. Follow up as scheduled.   Recent consult visits:  04/09/21 Donnal Moat PA-C [facility] health)- Data unavailable. 03/26/21- Earnie Larsson MD (Neurosurgery) - No medication changes. Follow up as scheduled.  03/18/21 Brand Males MD (Pulmonary)- Office visit for asthma. STARTED Symbicort 160-26mcg/ACT 2 puffs twice daily. DISCONTINUED Vitamin D and Flovent. Follow up in 4 weeks.  02/06/21 Geraldo Pitter NP (Pulmonary)- Office visit for sleep related hypoxia.  No medication changes. Change providers to Dr Chase Caller.  Follow up in 4 weeks.  02/03/21-Rakesh Elsworth Soho MD (Pulmonary)- Office visit for sleep related hypoxia.  No medication changes. Follow up as scheduled.   Hospital visits:  None in previous 6 months  Medications: Outpatient Encounter Medications as of 04/25/2021  Medication Sig   Alirocumab (PRALUENT) 150 MG/ML SOAJ Inject 150 mg into the skin every 14 (fourteen) days.   ALPHAGAN P 0.1 % SOLN Place 1 drop into both eyes every 8 (eight) hours.    azelastine (ASTELIN) 0.1 % nasal spray PLACE 2 SPRAYS INTO EACH NOSTRIL TWO TIMES A DAY AS DIRECTED   budesonide-formoterol (SYMBICORT) 160-4.5 MCG/ACT inhaler Inhale 2 puffs into the lungs in the morning and at bedtime.   Cholecalciferol (VITAMIN D3) 50 MCG (2000 UT) CAPS Take 2,000 Units by mouth daily.   clonazePAM (KLONOPIN) 2 MG tablet 1 mg 2 (two) times daily.    Cyanocobalamin (VITAMIN B-12 PO) Take by mouth.   fenofibrate (TRICOR) 145 MG tablet Take 1 tablet (145 mg total) by mouth daily.   fexofenadine (ALLEGRA) 180 MG tablet Take 180  mg by mouth daily.   fluticasone (FLONASE) 50 MCG/ACT nasal spray SPRAY TWO SPRAYS IN EACH NOSTRIL ONCE DAILY   gabapentin (NEURONTIN) 600 MG tablet Take 1,200 mg by mouth 2 (two) times daily.    HYDROcodone-acetaminophen (NORCO/VICODIN) 5-325 MG tablet Take 1 tablet by mouth every 6 (six) hours as needed for moderate pain. (Patient not taking: No sig reported)   hydrOXYzine (ATARAX/VISTARIL) 25 MG tablet Take 0.5-1 tablets (12.5-25 mg total) by mouth every 8 (eight) hours as needed. (Patient not taking: Reported on 04/21/2021)   lamoTRIgine (LAMICTAL) 200 MG tablet Take 200 mg by mouth 2 (two) times daily.   LUMIGAN 0.01 % SOLN Place 1 drop into both eyes at bedtime.    meloxicam (MOBIC) 15 MG tablet Take 1 tablet (15 mg total) by mouth daily.   methocarbamol (ROBAXIN) 500 MG tablet Take 1 tablet (500 mg total) by mouth every 8 (eight) hours as needed. (Patient not taking: Reported on 04/21/2021)   metoprolol succinate (TOPROL-XL) 50 MG 24 hr tablet TAKE ONE TABLET BY MOUTH DAILY WITH A MEAL   montelukast (SINGULAIR) 10 MG tablet Take 1 tablet (10 mg total) by mouth at bedtime.   Multiple Vitamin (MULTIVITAMIN WITH MINERALS) TABS tablet Take 1 tablet by mouth daily.    OLANZapine (ZYPREXA) 5 MG tablet Take 5 mg by mouth daily as needed.   omega-3 acid ethyl esters (LOVAZA) 1 g capsule TAKE TWO CAPSULES BY MOUTH TWICE A DAY   ondansetron (ZOFRAN ODT) 4 MG disintegrating tablet Take 1 tablet (4 mg total) by mouth every  8 (eight) hours as needed for nausea or vomiting. (Patient not taking: Reported on 04/21/2021)   Pyridoxine HCl (VITAMIN B-6 PO) Take by mouth.   QUEtiapine (SEROQUEL XR) 300 MG 24 hr tablet Take 2 tablets (600 mg total) by mouth at bedtime.   UNABLE TO FIND CPAP   venlafaxine (EFFEXOR-XR) 75 MG 24 hr capsule Take 75 mg by mouth daily. 2 po q am, 1 po q afternoon   Vitamin D, Ergocalciferol, (DRISDOL) 1.25 MG (50000 UNIT) CAPS capsule Take 1 capsule (50,000 Units total) by mouth every 7  (seven) days for 12 doses.   No facility-administered encounter medications on file as of 04/25/2021.   Completed PAP for Alphagan. Patient informed to fill out highlighted areas and mail back to Dr Virgil Benedict office attention [name].    Star Rating Drugs:  None listed  [facility]

## 2021-04-28 DIAGNOSIS — R69 Illness, unspecified: Secondary | ICD-10-CM | POA: Diagnosis not present

## 2021-04-28 DIAGNOSIS — Z1231 Encounter for screening mammogram for malignant neoplasm of breast: Secondary | ICD-10-CM | POA: Diagnosis not present

## 2021-04-28 LAB — HM MAMMOGRAPHY

## 2021-04-30 ENCOUNTER — Telehealth: Payer: Medicare HMO

## 2021-04-30 ENCOUNTER — Telehealth: Payer: Self-pay

## 2021-04-30 DIAGNOSIS — H401122 Primary open-angle glaucoma, left eye, moderate stage: Secondary | ICD-10-CM | POA: Diagnosis not present

## 2021-04-30 DIAGNOSIS — H04123 Dry eye syndrome of bilateral lacrimal glands: Secondary | ICD-10-CM | POA: Diagnosis not present

## 2021-04-30 DIAGNOSIS — H401112 Primary open-angle glaucoma, right eye, moderate stage: Secondary | ICD-10-CM | POA: Diagnosis not present

## 2021-04-30 NOTE — Telephone Encounter (Signed)
Spoke with patient and rescheduled for next week

## 2021-04-30 NOTE — Progress Notes (Deleted)
Chronic Care Management Pharmacy Note  04/30/2021 Name:  [name] MRN:  [id number] DOB:  May 07, 1956  Subjective: [name] is an 64 y.o. year old female who is a primary patient of Tabori, Aundra Millet, MD.  The CCM team was consulted for assistance with disease management and care coordination needs.    {CCMTELEPHONEFACETOFACE:21091510} for {CCMINITIALFOLLOWUPCHOICE:21091511} in response to provider referral for pharmacy case management and/or care coordination services.   Consent to Services:  {CCMCONSENTOPTIONS:25074}  Patient Care Team: Midge Minium, MD as PCP - General Midge Minium, MD Megan Salon, MD as Consulting Physician (Gynecology) Jettie Booze, MD as Consulting Physician (Cardiology) Leta Baptist, MD as Consulting Physician (Otolaryngology) Brayton Caves, MD as Referring Physician (Psychiatry) Janeth Rase, NP as Nurse Practitioner (Adult Health Nurse Practitioner) Darlyne Russian, LCSW as Social Worker (Licensed Clinical Social Worker) Star Age, MD as Attending Physician (Neurology) [name], [facility] as Pharmacist (Pharmacist)  Recent office visits:   Recent consult visits:   Hospital visits: {Hospital DC Yes/No:21091515}  Objective: Lab Results  Component Value Date   CREATININE 0.67 04/09/2021   CREATININE 0.60 08/09/2020   CREATININE 0.57 07/05/2020   GFR 92.86 04/09/2021   GFR 100.97 08/09/2020   GFRNONAA >90 09/02/2012   GFRNONAA >90 09/01/2012  Last diabetic Eye exam:  Lab Results  Component Value Date/Time   HMDIABEYEEXA No Retinopathy 08/02/2020 01:05 PM    Last diabetic Foot exam: No results found for: HMDIABFOOTEX  Lab Results  Component Value Date   CHOL 148 04/09/2021   CHOL 127 08/09/2020   TRIG 189.0 (H) 04/09/2021   TRIG 146.0 08/09/2020   HDL 66.30 04/09/2021   HDL 70.20 08/09/2020   CHOLHDL 2 04/09/2021   CHOLHDL 2 08/09/2020   VLDL 37.8 04/09/2021   VLDL 29.2 08/09/2020   LDLCALC 44  04/09/2021   LDLCALC 28 08/09/2020   LDLDIRECT 123.0 01/26/2019   LDLDIRECT 178.0 11/04/2017   Hepatic Function Latest Ref Rng & Units 04/09/2021 08/09/2020 07/05/2020  Total Protein 6.0 - 8.3 g/dL 7.2 6.9 6.9  Albumin 3.5 - 5.2 g/dL 5.0 4.7 4.7  AST 0 - 37 U/L 18 16 13   ALT 0 - 35 U/L 20 23 18   Alk Phosphatase 39 - 117 U/L 29(L) 19(L) 19(L)  Total Bilirubin 0.2 - 1.2 mg/dL 0.6 0.5 0.6  Bilirubin, Direct 0.0 - 0.3 mg/dL 0.1 0.1 0.1   Lab Results  Component Value Date/Time   TSH 1.29 04/09/2021 03:06 PM   TSH 1.32 08/09/2020 10:49 AM   FREET4 0.68 07/05/2020 12:00 PM   FREET4 0.58 (L) 02/23/2013 04:29 PM   CBC Latest Ref Rng & Units 04/09/2021 03/18/2021 08/09/2020  WBC 4.0 - 10.5 K/uL 4.4 4.5 4.1  Hemoglobin 12.0 - 15.0 g/dL 12.6 13.1 12.2  Hematocrit 36.0 - 46.0 % 36.9 38.0 35.8(L)  Platelets 150.0 - 400.0 K/uL 220.0 238.0 241.0   Lab Results  Component Value Date/Time   VD25OH 20.18 (L) 04/09/2021 03:06 PM   VD25OH 26.77 (L) 08/09/2020 10:49 AM    Clinical ASCVD: {YES/NO:21197} The 10-year ASCVD risk score Mikey Bussing DC Jr., et al., 2013) is: 7.7%   Values used to calculate the score:     Age: 63 years     Sex: Female     Is Non-Hispanic African American: No     Diabetic: Yes     Tobacco smoker: No     Systolic Blood Pressure: 761 mmHg     Is BP treated: Yes  HDL Cholesterol: 66.3 mg/dL     Total Cholesterol: 148 mg/dL    Other: ***  Social History   Tobacco Use  Smoking Status Never  Smokeless Tobacco Never   BP Readings from Last 3 Encounters:  04/09/21 121/80  03/18/21 116/74  02/06/21 132/74   Pulse Readings from Last 3 Encounters:  04/09/21 66  03/18/21 73  02/06/21 75   Wt Readings from Last 3 Encounters:  04/21/21 220 lb (99.8 kg)  04/09/21 222 lb 9.6 oz (101 kg)  03/18/21 222 lb 12.8 oz (101.1 kg)    Assessment: Review of patient past medical history, allergies, medications, health status, including review of consultants reports, laboratory and  other test data, was performed as part of comprehensive evaluation and provision of chronic care management services.   SDOH:  (Social Determinants of Health) assessments and interventions performed:   CCM Care Plan Allergies  Allergen Reactions   Nitrofurantoin Monohyd Macro Nausea Only    Severe headache   Statins Other (See Comments)    Severe weakness, pain   Sulfa Antibiotics Nausea And Vomiting   Alprazolam Anxiety and Other (See Comments)    Hyper    Amoxicillin Rash   Prednisone Anxiety   Medications Reviewed Today     Reviewed by Clydell Hakim, RD (Registered Dietitian) on 04/21/21 at 1609  Med List Status: <None>   Medication Order Taking? Sig Documenting Provider Last Dose Status Informant  Alirocumab (PRALUENT) 150 MG/ML SOAJ [id number] Yes Inject 150 mg into the skin every 14 (fourteen) days. Jettie Booze, MD Taking Active   ALPHAGAN P 0.1 % SOLN [id number] Yes Place 1 drop into both eyes every 8 (eight) hours.  [provider] Taking Active Self  azelastine (ASTELIN) 0.1 % nasal spray [id number] Yes PLACE 2 SPRAYS INTO EACH NOSTRIL TWO TIMES A DAY AS DIRECTED Midge Minium, MD Taking Active   budesonide-formoterol [facility]) 160-4.5 MCG/ACT inhaler [id number] Yes Inhale 2 puffs into the lungs in the morning and at bedtime. Brand Males, MD Taking Active   Cholecalciferol (VITAMIN D3) 50 MCG (2000 UT) CAPS [id number] Yes Take 2,000 Units by mouth daily. [provider] Taking Active   clonazePAM (KLONOPIN) 2 MG tablet [id number] Yes 1 mg 2 (two) times daily.  [provider] Taking Active            Med Note Gar Ponto   Fri Dec 29, 2019  6:22 PM)    Cyanocobalamin (VITAMIN B-12 PO) [id number] Yes Take by mouth. [provider] Taking Active   fenofibrate (TRICOR) 145 MG tablet [id number] Yes Take 1 tablet (145 mg total) by mouth daily. Midge Minium, MD Taking Active   fexofenadine [facility]) 180 MG tablet  [id number] Yes Take 180 mg by mouth daily. [provider] Taking Active   fluticasone (FLONASE) 50 MCG/ACT nasal spray [id number] Yes SPRAY TWO SPRAYS IN EACH NOSTRIL ONCE DAILY Midge Minium, MD Taking Active   gabapentin (NEURONTIN) 600 MG tablet [id number] Yes Take 1,200 mg by mouth 2 (two) times daily.  [provider] Taking Active Self  HYDROcodone-acetaminophen (NORCO/VICODIN) 5-325 MG tablet [id number] No Take 1 tablet by mouth every 6 (six) hours as needed for moderate pain.  Patient not taking: No sig reported   Midge Minium, MD Not Taking Active   hydrOXYzine (ATARAX/VISTARIL) 25 MG tablet [id number] No Take 0.5-1 tablets (12.5-25 mg total) by mouth every 8 (eight) hours as needed.  Patient not taking: Reported on 04/21/2021   Adelene Idler,  Dorothea Glassman, PA-C Not Taking Active   lamoTRIgine (LAMICTAL) 200 MG tablet [id number] Yes Take 200 mg by mouth 2 (two) times daily. [provider] Taking Active Self  LUMIGAN 0.01 % SOLN [id number]  Place 1 drop into both eyes at bedtime.  [provider]  Active   meloxicam (MOBIC) 15 MG tablet [id number] Yes Take 1 tablet (15 mg total) by mouth daily. Midge Minium, MD Taking Active   methocarbamol (ROBAXIN) 500 MG tablet [id number] No Take 1 tablet (500 mg total) by mouth every 8 (eight) hours as needed.  Patient not taking: Reported on 04/21/2021   Midge Minium, MD Not Taking Active   metoprolol succinate (TOPROL-XL) 50 MG 24 hr tablet [id number] Yes TAKE ONE TABLET BY MOUTH DAILY WITH A MEAL Midge Minium, MD Taking Active   montelukast (SINGULAIR) 10 MG tablet [id number] Yes Take 1 tablet (10 mg total) by mouth at bedtime. Midge Minium, MD Taking Active   Multiple Vitamin (MULTIVITAMIN WITH MINERALS) TABS tablet [id number]  Take 1 tablet by mouth daily.  [provider]  Active   OLANZapine (ZYPREXA) 5 MG tablet [id number] Yes Take 5 mg by mouth daily as needed. [provider]  Taking Active   omega-3 acid ethyl esters (LOVAZA) 1 g capsule [id number] Yes TAKE TWO CAPSULES BY MOUTH TWICE A DAY Jettie Booze, MD Taking Active   ondansetron (ZOFRAN ODT) 4 MG disintegrating tablet [id number] No Take 1 tablet (4 mg total) by mouth every 8 (eight) hours as needed for nausea or vomiting.  Patient not taking: Reported on 04/21/2021   Scot Jun, FNP Not Taking Active   Pyridoxine HCl (VITAMIN B-6 PO) [id number] Yes Take by mouth. [provider] Taking Active   QUEtiapine (SEROQUEL XR) 300 MG 24 hr tablet [id number] Yes Take 2 tablets (600 mg total) by mouth at bedtime. Addison Lank, PA-C Taking Active   UNABLE TO FIND [id number] Yes CPAP [provider] Taking Active   venlafaxine (EFFEXOR-XR) 75 MG 24 hr capsule [id number] Yes Take 75 mg by mouth daily. 2 po q am, 1 po q afternoon [provider] Taking Active Self  Vitamin D, Ergocalciferol, (DRISDOL) 1.25 MG (50000 UNIT) CAPS capsule [id number] Yes Take 1 capsule (50,000 Units total) by mouth every 7 (seven) days for 12 doses. Midge Minium, MD Taking Active            Patient Active Problem List   Diagnosis Date Noted   Sleep related hypoxia 12/17/2020   Cervical myelopathy ([place]) 10/16/2020   Elevated blood-pressure reading, without diagnosis of hypertension 10/16/2020   Lumbar radiculopathy 10/16/2020   Positive ANA (antinuclear antibody) 08/14/2020   Chronically dry eyes, bilateral 08/14/2020   Dry mouth 08/14/2020   Allergic rhinitis due to pollen 12/13/2018   Diet-controlled diabetes mellitus ([place]) 02/09/2018   Statin intolerance 11/04/2017   Family history of early CAD 03/10/2017   HTN (hypertension) 02/15/2017   Herniated nucleus pulposus, L5-S1 06/22/2014   Asthma, moderate persistent 10/09/2013   Obesity (BMI 30-39.9) 10/09/2013   Fatigue 12/12/2012   GERD (gastroesophageal reflux disease) 09/02/2012   Vitamin D deficiency 09/15/2010   DEPRESSIVE DISORDER  07/17/2010   RHINITIS 06/14/2009   MUSCLE WEAKNESS (GENERALIZED) 01/03/2009   Hyperlipidemia 10/10/2008   BIPOLAR AFFECTIVE DISORDER 10/10/2008   ADD 10/10/2008   Immunization History  Administered Date(s) Administered   Influenza Split 09/02/2012   Influenza,inj,Quad PF,6+ Mos 10/09/2013, 08/13/2015, 08/17/2016, 07/28/2017, 08/03/2018, 08/08/2019, 07/20/2020   Moderna Sars-Covid-2  Vaccination 02/27/2019, 01/27/2020, 09/11/2020   Pneumococcal Polysaccharide-23 09/02/2012   Tdap 07/15/2012   Zoster Recombinat (Shingrix) 11/10/2018, 06/09/2019    Conditions to be addressed/monitored: {CCM ASSESSMENT DISEASE OPTIONS:25047}  There are no care plans that you recently modified to display for this patient.  Current Barriers:  {pharmacybarriers:24917}  Pharmacist Clinical Goal(s):  Over the next *** days, patient will {PHARMACYGOALCHOICES:24921} through collaboration with PharmD and provider.   Interventions: 1:1 collaboration with Midge Minium, MD regarding development and update of comprehensive plan of care as evidenced by provider attestation and co-signature Inter-disciplinary care team collaboration (see longitudinal plan of care) Comprehensive medication review performed; medication list updated in electronic medical record  {CCM PHARMD DISEASE STATES:25130}  Patient Goals/Self-Care Activities Over the next *** days, patient will:  - {pharmacypatientgoals:24919}  Medication Assistance: {MEDASSISTANCEINFO:25044}  Patient's preferred pharmacy is:  [id number] - [address], [place] [address] Phone: [phone] Fax: [phone]  [id number] [address]Phone: [phone]: [phone] Uses pill box? {Yes or If no, why not?:20788}. Pt endorses ***% compliance  Follow Up:   {FOLLOWUP:24991} Plan: {CM FOLLOW UP PYFRT:02111}Future Appointments  Date Time Provider DCatarina 05/13/2021  2:30 PM RBrand Males MD [facility]PUL[facility] None  06/09/2021  9:30 AM HDonnal MoatT, PA-C CP-CP None  06/23/2021 10:30 AM JValora PiccoloLBarnabas Lister RD NSasakwaNDM  07/02/2021 10:00 AM L[facility] PREVISIT RM 51 [facility]-LE[facility] LBPCEndo  07/16/2021 11:30 AM GGatha Mayer MD [facility] LBPCEndo  09/01/2021  3:00 PM [facility] HEALTH COACH [facility]  09/03/2021  9:00 AM TMidge Minium MD [facility] P[facility]    JMadelin Rear Pharm.D., BCGP Clinical Pharmacist [facility]([phone]

## 2021-04-30 NOTE — Telephone Encounter (Signed)
  Chronic Care Management  Outreach Note   Name: LETTY SALVI MRN: [id number] DOB: 01-09-1956  Referred by: Midge Minium, MD Reason for referral: Telephone Appointment with [facility] Pharmacist, [name].   An unsuccessful telephone outreach was attempted today. The patient was referred to the pharmacist for assistance with care management and care coordination.   Telephone appointment with clinical pharmacist today (04/30/2021) at [place], PharmD, [place] Clinical Pharmacist Practitioner  [facility]  [phone]

## 2021-05-05 NOTE — Progress Notes (Signed)
Blood allergy test normal. Will not be calling in this normal results. Sorry for delay

## 2021-05-07 ENCOUNTER — Ambulatory Visit (INDEPENDENT_AMBULATORY_CARE_PROVIDER_SITE_OTHER): Payer: Medicare HMO

## 2021-05-07 ENCOUNTER — Encounter: Payer: Self-pay | Admitting: *Deleted

## 2021-05-07 DIAGNOSIS — E119 Type 2 diabetes mellitus without complications: Secondary | ICD-10-CM

## 2021-05-07 DIAGNOSIS — Z789 Other specified health status: Secondary | ICD-10-CM

## 2021-05-07 DIAGNOSIS — E782 Mixed hyperlipidemia: Secondary | ICD-10-CM | POA: Diagnosis not present

## 2021-05-07 NOTE — Progress Notes (Signed)
Chronic Care Management Pharmacy Note  05/07/2021 Name:  [name] MRN:  [id number] DOB:  1956-03-17  Subjective: [name] is an 65 y.o. year old female who is a primary patient of Tabori, Aundra Millet, MD.  The CCM team was consulted for assistance with disease management and care coordination needs.    Engaged with patient by telephone for follow up visit in response to provider referral for pharmacy case management and/or care coordination services.   Consent to Services:  The patient was given information about Chronic Care Management services, agreed to services, and gave verbal consent prior to initiation of services.  Please see initial visit note for detailed documentation.   Patient Care Team: Midge Minium, MD as PCP - General Midge Minium, MD Megan Salon, MD as Consulting Physician (Gynecology) Jettie Booze, MD as Consulting Physician (Cardiology) Leta Baptist, MD as Consulting Physician (Otolaryngology) Brayton Caves, MD as Referring Physician (Psychiatry) Janeth Rase, NP as Nurse Practitioner (Adult Health Nurse Practitioner) Darlyne Russian, LCSW as Social Worker (Licensed Clinical Social Worker) Star Age, MD as Attending Physician (Neurology) [name], [facility] as Pharmacist (Pharmacist) Objective:  Lab Results  Component Value Date   CREATININE 0.67 04/09/2021   CREATININE 0.60 08/09/2020   CREATININE 0.57 07/05/2020    Lab Results  Component Value Date   HGBA1C 7.4 (H) 04/09/2021   Last diabetic Eye exam:  Lab Results  Component Value Date/Time   HMDIABEYEEXA No Retinopathy 08/02/2020 01:05 PM    Last diabetic Foot exam: No results found for: HMDIABFOOTEX      Component Value Date/Time   CHOL 148 04/09/2021 1506   CHOL 163 11/14/2018 1447   CHOL 312 (H) 06/19/2015 0924   TRIG 189.0 (H) 04/09/2021 1506   TRIG 657 (H) 06/19/2015 0924   HDL 66.30 04/09/2021 1506   HDL 71 11/14/2018 1447   HDL 44 06/19/2015 0924    CHOLHDL 2 04/09/2021 1506   VLDL 37.8 04/09/2021 1506   [place] 44 04/09/2021 1506   [place] 66 11/14/2018 1447   [place] Comment 06/19/2015 0924   LDLDIRECT 123.0 01/26/2019 1030    Hepatic Function Latest Ref Rng & Units 04/09/2021 08/09/2020 07/05/2020  Total Protein 6.0 - 8.3 g/dL 7.2 6.9 6.9  Albumin 3.5 - 5.2 g/dL 5.0 4.7 4.7  AST 0 - 37 U/L 18 16 13   ALT 0 - 35 U/L 20 23 18   Alk Phosphatase 39 - 117 U/L 29(L) 19(L) 19(L)  Total Bilirubin 0.2 - 1.2 mg/dL 0.6 0.5 0.6  Bilirubin, Direct 0.0 - 0.3 mg/dL 0.1 0.1 0.1    Lab Results  Component Value Date/Time   TSH 1.29 04/09/2021 03:06 PM   TSH 1.32 08/09/2020 10:49 AM   FREET4 0.68 07/05/2020 12:00 PM   FREET4 0.58 (L) 02/23/2013 04:29 PM    CBC Latest Ref Rng & Units 04/09/2021 03/18/2021 08/09/2020  WBC 4.0 - 10.5 K/uL 4.4 4.5 4.1  Hemoglobin 12.0 - 15.0 g/dL 12.6 13.1 12.2  Hematocrit 36.0 - 46.0 % 36.9 38.0 35.8(L)  Platelets 150.0 - 400.0 K/uL 220.0 238.0 241.0    Lab Results  Component Value Date/Time   VD25OH 20.18 (L) 04/09/2021 03:06 PM   VD25OH 26.77 (L) 08/09/2020 10:49 AM    Clinical ASCVD:  The 10-year ASCVD risk score Mikey Bussing DC Jr., et al., 2013) is: 7.7%   Values used to calculate the score:     Age: 75 years     Sex: Female  Is Non-Hispanic African American: No     Diabetic: Yes     Tobacco smoker: No     Systolic Blood Pressure: 226 mmHg     Is BP treated: Yes     HDL Cholesterol: 66.3 mg/dL     Total Cholesterol: 148 mg/dL    Social History   Tobacco Use  Smoking Status Never  Smokeless Tobacco Never   BP Readings from Last 3 Encounters:  04/09/21 121/80  03/18/21 116/74  02/06/21 132/74   Pulse Readings from Last 3 Encounters:  04/09/21 66  03/18/21 73  02/06/21 75   Wt Readings from Last 3 Encounters:  04/21/21 220 lb (99.8 kg)  04/09/21 222 lb 9.6 oz (101 kg)  03/18/21 222 lb 12.8 oz (101.1 kg)    Assessment: Review of patient past medical history, allergies, medications,  health status, including review of consultants reports, laboratory and other test data, was performed as part of comprehensive evaluation and provision of chronic care management services.   SDOH:  (Social Determinants of Health) assessments and interventions performed: Yes   CCM Care Plan  Allergies  Allergen Reactions   Nitrofurantoin Monohyd Macro Nausea Only    Severe headache   Statins Other (See Comments)    Severe weakness, pain   Sulfa Antibiotics Nausea And Vomiting   Alprazolam Anxiety and Other (See Comments)    Hyper    Amoxicillin Rash   Prednisone Anxiety    Medications Reviewed Today     Reviewed by Clydell Hakim, RD (Registered Dietitian) on 04/21/21 at 1609  Med List Status: <None>   Medication Order Taking? Sig Documenting Provider Last Dose Status Informant  Alirocumab (PRALUENT) 150 MG/ML SOAJ [id number] Yes Inject 150 mg into the skin every 14 (fourteen) days. Jettie Booze, MD Taking Active   ALPHAGAN P 0.1 % SOLN [id number] Yes Place 1 drop into both eyes every 8 (eight) hours.  [provider] Taking Active Self  azelastine (ASTELIN) 0.1 % nasal spray [id number] Yes PLACE 2 SPRAYS INTO EACH NOSTRIL TWO TIMES A DAY AS DIRECTED Midge Minium, MD Taking Active   budesonide-formoterol [facility]) 160-4.5 MCG/ACT inhaler [id number] Yes Inhale 2 puffs into the lungs in the morning and at bedtime. Brand Males, MD Taking Active   Cholecalciferol (VITAMIN D3) 50 MCG (2000 UT) CAPS [id number] Yes Take 2,000 Units by mouth daily. [provider] Taking Active   clonazePAM (KLONOPIN) 2 MG tablet [id number] Yes 1 mg 2 (two) times daily.  [provider] Taking Active            Med Note Gar Ponto   Fri Dec 29, 2019  6:22 PM)    Cyanocobalamin (VITAMIN B-12 PO) [id number] Yes Take by mouth. [provider] Taking Active   fenofibrate (TRICOR) 145 MG tablet [id number] Yes Take 1 tablet (145 mg total) by mouth daily.  Midge Minium, MD Taking Active   fexofenadine [facility]) 180 MG tablet [id number] Yes Take 180 mg by mouth daily. [provider] Taking Active   fluticasone (FLONASE) 50 MCG/ACT nasal spray [id number] Yes SPRAY TWO SPRAYS IN EACH NOSTRIL ONCE DAILY Midge Minium, MD Taking Active   gabapentin (NEURONTIN) 600 MG tablet [id number] Yes Take 1,200 mg by mouth 2 (two) times daily.  [provider] Taking Active Self  HYDROcodone-acetaminophen (NORCO/VICODIN) 5-325 MG tablet [id number] No Take 1 tablet by mouth every 6 (six) hours as needed for moderate pain.  Patient not taking: No sig reported   Tabori,  Aundra Millet, MD Not Taking Active   hydrOXYzine (ATARAX/VISTARIL) 25 MG tablet [id number] No Take 0.5-1 tablets (12.5-25 mg total) by mouth every 8 (eight) hours as needed.  Patient not taking: Reported on 04/21/2021   Addison Lank, PA-C Not Taking Active   lamoTRIgine (LAMICTAL) 200 MG tablet [id number] Yes Take 200 mg by mouth 2 (two) times daily. [provider] Taking Active Self  LUMIGAN 0.01 % SOLN [id number]  Place 1 drop into both eyes at bedtime.  [provider]  Active   meloxicam (MOBIC) 15 MG tablet [id number] Yes Take 1 tablet (15 mg total) by mouth daily. Midge Minium, MD Taking Active   methocarbamol (ROBAXIN) 500 MG tablet [id number] No Take 1 tablet (500 mg total) by mouth every 8 (eight) hours as needed.  Patient not taking: Reported on 04/21/2021   Midge Minium, MD Not Taking Active   metoprolol succinate (TOPROL-XL) 50 MG 24 hr tablet [id number] Yes TAKE ONE TABLET BY MOUTH DAILY WITH A MEAL Midge Minium, MD Taking Active   montelukast (SINGULAIR) 10 MG tablet [id number] Yes Take 1 tablet (10 mg total) by mouth at bedtime. Midge Minium, MD Taking Active   Multiple Vitamin (MULTIVITAMIN WITH MINERALS) TABS tablet [id number]  Take 1 tablet by mouth daily.  [provider]  Active   OLANZapine (ZYPREXA) 5 MG  tablet [id number] Yes Take 5 mg by mouth daily as needed. [provider] Taking Active   omega-3 acid ethyl esters (LOVAZA) 1 g capsule [id number] Yes TAKE TWO CAPSULES BY MOUTH TWICE A DAY Jettie Booze, MD Taking Active   ondansetron (ZOFRAN ODT) 4 MG disintegrating tablet [id number] No Take 1 tablet (4 mg total) by mouth every 8 (eight) hours as needed for nausea or vomiting.  Patient not taking: Reported on 04/21/2021   Scot Jun, FNP Not Taking Active   Pyridoxine HCl (VITAMIN B-6 PO) [id number] Yes Take by mouth. [provider] Taking Active   QUEtiapine (SEROQUEL XR) 300 MG 24 hr tablet [id number] Yes Take 2 tablets (600 mg total) by mouth at bedtime. Addison Lank, PA-C Taking Active   UNABLE TO FIND [id number] Yes CPAP [provider] Taking Active   venlafaxine (EFFEXOR-XR) 75 MG 24 hr capsule [id number] Yes Take 75 mg by mouth daily. 2 po q am, 1 po q afternoon [provider] Taking Active Self  Vitamin D, Ergocalciferol, (DRISDOL) 1.25 MG (50000 UNIT) CAPS capsule [id number] Yes Take 1 capsule (50,000 Units total) by mouth every 7 (seven) days for 12 doses. Midge Minium, MD Taking Active             Patient Active Problem List   Diagnosis Date Noted   Sleep related hypoxia 12/17/2020   Cervical myelopathy ([place]) 10/16/2020   Elevated blood-pressure reading, without diagnosis of hypertension 10/16/2020   Lumbar radiculopathy 10/16/2020   Positive ANA (antinuclear antibody) 08/14/2020   Chronically dry eyes, bilateral 08/14/2020   Dry mouth 08/14/2020   Allergic rhinitis due to pollen 12/13/2018   Diet-controlled diabetes mellitus ([place]) 02/09/2018   Statin intolerance 11/04/2017   Family history of early CAD 03/10/2017   HTN (hypertension) 02/15/2017   Herniated nucleus pulposus, L5-S1 06/22/2014   Asthma, moderate persistent 10/09/2013   Obesity (BMI 30-39.9) 10/09/2013   Fatigue 12/12/2012   GERD (gastroesophageal  reflux disease) 09/02/2012   Vitamin D deficiency 09/15/2010   DEPRESSIVE DISORDER 07/17/2010   RHINITIS 06/14/2009   MUSCLE WEAKNESS (GENERALIZED) 01/03/2009   Hyperlipidemia  10/10/2008   BIPOLAR AFFECTIVE DISORDER 10/10/2008   ADD 10/10/2008    Immunization History  Administered Date(s) Administered   Influenza Split 09/02/2012   Influenza,inj,Quad PF,6+ Mos 10/09/2013, 08/13/2015, 08/17/2016, 07/28/2017, 08/03/2018, 08/08/2019, 07/20/2020   Moderna Sars-Covid-2 Vaccination 02/27/2019, 01/27/2020, 09/11/2020   Pneumococcal Polysaccharide-23 09/02/2012   Tdap 07/15/2012   Zoster Recombinat (Shingrix) 11/10/2018, 06/09/2019    Conditions to be addressed/monitored: HLD HTN DMII GERD  Care Plan : [place]  Updates made by [name], [facility] since 05/08/2021 12:00 AM     Problem: HLD HTN DMII GERD      Long-Range Goal: Disease Management   Start Date: 01/10/2021  Expected End Date: 01/10/2022  Recent Progress: On track  Priority: High  Note:    Current Barriers:  Unable to maintain control of diabetes  Pharmacist Clinical Goal(s):  Patient will verbalize ability to afford treatment regimen through collaboration with PharmD and provider.   Interventions: 1:1 collaboration with Midge Minium, MD regarding development and update of comprehensive plan of care as evidenced by provider attestation and co-signature Inter-disciplinary care team collaboration (see longitudinal plan of care) Comprehensive medication review performed; medication list updated in electronic medical record  Hyperlipidemia: (LDL goal < 70) -Controlled -Current treatment: Repatha 140 mg injection  Lovaza 1 gram  Fenofibrate 145 mg once daily  -Medications previously tried: statin intolerant  -Current dietary patterns: had been stress eating a lot previously and attributes that to increased a1c, now being much more mindful of diet. -Current exercise habits: also as made plans to  exercise, will consider getting back to the gym. Had tried to go before covid, will likely start back up. -Educated on Cholesterol goals;  Will contact pharmacy to apply healthwell grant to [place] - she be $0 copay as this qualifies same as praluent would -Counseled on diet and exercise extensively Recommended to continue current medication  Diabetes (A1c goal <7%) -Not ideally controlled -Current medications: No current medications  -Current home glucose readings fasting glucose: has not tested due to previously being controled, would be open to testing if needed in the future. -Educated on A1c and blood sugar goals; -Note dietary/exercise changes in HLD -Counseled on diet and exercise extensively  Patient Goals/Self-Care Activities Patient will:  - take medications as prescribed target a minimum of 150 minutes of moderate intensity exercise weekly  Follow Up Plan: will contact pharmacy to ensure healthwell grant coordination of benefits is applied as secondary insurance to [place] (same as praluent). Patient also looking for help with coordinating job applications. Will review for internal/external resources.    Patient's preferred pharmacy is:  [id number] - [address], [place] [address] Phone: [phone] Fax: [phone]  [id number] [address] [address]Phone: [phone]: [phone] Follow Up:  Patient agrees to Care Plan and Follow-up.  Future Appointments  Date Time Provider DEthel 05/13/2021  2:30 PM RBrand Males MD LBPU-PULCARE None  06/09/2021  9:30 AM HDonnal MoatT, PA-C CP-CP None  06/23/2021 10:30 AM JValora PiccoloLBarnabas Lister RD NCheverlyNDM  07/02/2021 10:00 AM LBGI-LEC PREVISIT RM 51 LBGI-LEC LBPCEndo  07/16/2021 11:30 AM GGatha Mayer MD LBGI-LEC LBPCEndo  09/01/2021  3:00 PM  LBPC-SV HEALTH COACH LBPC-SV PEC  09/03/2021  9:00 AM TMidge Minium MD LBPC-SV PEC   JMadelin Rear PharmD, CPP Clinical Pharmacist Practitioner  [facility]  [phone]

## 2021-05-08 ENCOUNTER — Telehealth: Payer: Self-pay

## 2021-05-08 NOTE — Patient Instructions (Signed)
[name],  Thank you for talking with me today. I have included our care plan/goals in the following pages.   Please review and call me at [phone] with any questions.  Thanks! Ellin Mayhew, Pharm.D., BCGP Clinical Pharmacist [facility] [place] [phone] Patient Care Plan: [place] Plan     Problem Identified: HLD HTN DMII GERD      Long-Range Goal: Disease Management   Start Date: 01/10/2021  Expected End Date: 01/10/2022  Recent Progress: On track  Priority: High  Note:    Current Barriers:  Unable to maintain control of diabetes  Pharmacist Clinical Goal(s):  Patient will verbalize ability to afford treatment regimen through collaboration with PharmD and provider.   Interventions: 1:1 collaboration with Midge Minium, MD regarding development and update of comprehensive plan of care as evidenced by provider attestation and co-signature Inter-disciplinary care team collaboration (see longitudinal plan of care) Comprehensive medication review performed; medication list updated in electronic medical record  Hyperlipidemia: (LDL goal < 70) -Controlled -Current treatment: Repatha 140 mg injection  Lovaza 1 gram  Fenofibrate 145 mg once daily  -Medications previously tried: statin intolerant  -Current dietary patterns: had been stress eating a lot previously and attributes that to increased a1c, now being much more mindful of diet. -Current exercise habits: also as made plans to exercise, will consider getting back to the gym. Had tried to go before covid, will likely start back up. -Educated on Cholesterol goals;  Will contact pharmacy to apply healthwell grant to [place] - she be $0 copay as this qualifies same as praluent would -Counseled on diet and exercise extensively Recommended to continue current medication  Diabetes (A1c goal <7%) -Not ideally controlled -Current medications: No current  medications  -Current home glucose readings fasting glucose: has not tested due to previously being controled, would be open to testing if needed in the future. -Educated on A1c and blood sugar goals; -Note dietary/exercise changes in HLD -Counseled on diet and exercise extensively  Patient Goals/Self-Care Activities Patient will:  - take medications as prescribed target a minimum of 150 minutes of moderate intensity exercise weekly  Follow Up Plan: will contact pharmacy to ensure healthwell grant coordination of benefits is applied as secondary insurance to [place] (same as praluent). Patient also looking for help with coordinating job applications. Will review for internal/external resources.     The patient verbalized understanding of instructions provided today and agreed to receive a MyChart copy of patient instruction and/or educational materials. Telephone follow up appointment with pharmacy team member scheduled for: See next appointment with "Care Management Staff" under "What's Next" below.

## 2021-05-08 NOTE — Chronic Care Management (AMB) (Signed)
    Chronic Care Management Pharmacy Assistant   Name: [name]  MRN: [id number] DOB: Feb 15, 1956  Reason for Encounter: Chart Review   Medications: Outpatient Encounter Medications as of 05/08/2021  Medication Sig   Alirocumab (PRALUENT) 150 MG/ML SOAJ Inject 150 mg into the skin every 14 (fourteen) days.   ALPHAGAN P 0.1 % SOLN Place 1 drop into both eyes every 8 (eight) hours.    azelastine (ASTELIN) 0.1 % nasal spray PLACE 2 SPRAYS INTO EACH NOSTRIL TWO TIMES A DAY AS DIRECTED   budesonide-formoterol (SYMBICORT) 160-4.5 MCG/ACT inhaler Inhale 2 puffs into the lungs in the morning and at bedtime.   Cholecalciferol (VITAMIN D3) 50 MCG (2000 UT) CAPS Take 2,000 Units by mouth daily.   clonazePAM (KLONOPIN) 2 MG tablet 1 mg 2 (two) times daily.    Cyanocobalamin (VITAMIN B-12 PO) Take by mouth.   fenofibrate (TRICOR) 145 MG tablet Take 1 tablet (145 mg total) by mouth daily.   fexofenadine (ALLEGRA) 180 MG tablet Take 180 mg by mouth daily.   fluticasone (FLONASE) 50 MCG/ACT nasal spray SPRAY TWO SPRAYS IN EACH NOSTRIL ONCE DAILY   gabapentin (NEURONTIN) 600 MG tablet Take 1,200 mg by mouth 2 (two) times daily.    lamoTRIgine (LAMICTAL) 200 MG tablet Take 200 mg by mouth 2 (two) times daily.   LUMIGAN 0.01 % SOLN Place 1 drop into both eyes at bedtime.    meloxicam (MOBIC) 15 MG tablet Take 1 tablet (15 mg total) by mouth daily.   metoprolol succinate (TOPROL-XL) 50 MG 24 hr tablet TAKE ONE TABLET BY MOUTH DAILY WITH A MEAL   montelukast (SINGULAIR) 10 MG tablet Take 1 tablet (10 mg total) by mouth at bedtime.   Multiple Vitamin (MULTIVITAMIN WITH MINERALS) TABS tablet Take 1 tablet by mouth daily.    OLANZapine (ZYPREXA) 5 MG tablet Take 5 mg by mouth daily as needed.   omega-3 acid ethyl esters (LOVAZA) 1 g capsule TAKE TWO CAPSULES BY MOUTH TWICE A DAY   Pyridoxine HCl (VITAMIN B-6 PO) Take by mouth.   QUEtiapine (SEROQUEL XR) 300 MG 24 hr tablet Take 2 tablets (600 mg total) by  mouth at bedtime.   UNABLE TO FIND CPAP   venlafaxine (EFFEXOR-XR) 75 MG 24 hr capsule Take 75 mg by mouth daily. 2 po q am, 1 po q afternoon   Vitamin D, Ergocalciferol, (DRISDOL) 1.25 MG (50000 UNIT) CAPS capsule Take 1 capsule (50,000 Units total) by mouth every 7 (seven) days for 12 doses.   No facility-administered encounter medications on file as of 05/08/2021.   Reviewed chart for medication changes and adherence.   No OVs, Consults, or hospital visits since last care coordination call / Pharmacist visit. No medication changes indicated  No gaps in adherence identified.  No further action required.  Wilford Sports CPA, CMA

## 2021-05-09 ENCOUNTER — Encounter: Payer: Medicare HMO | Admitting: Internal Medicine

## 2021-05-13 ENCOUNTER — Ambulatory Visit: Payer: Medicare HMO | Admitting: Internal Medicine

## 2021-05-15 ENCOUNTER — Ambulatory Visit (INDEPENDENT_AMBULATORY_CARE_PROVIDER_SITE_OTHER): Payer: Medicare HMO | Admitting: Family Medicine

## 2021-05-15 ENCOUNTER — Other Ambulatory Visit: Payer: Self-pay

## 2021-05-15 ENCOUNTER — Encounter: Payer: Self-pay | Admitting: Family Medicine

## 2021-05-15 VITALS — BP 138/90 | HR 70 | Temp 99.1°F | Resp 20 | Ht 69.0 in | Wt 220.2 lb

## 2021-05-15 DIAGNOSIS — I10 Essential (primary) hypertension: Secondary | ICD-10-CM | POA: Diagnosis not present

## 2021-05-15 DIAGNOSIS — J329 Chronic sinusitis, unspecified: Secondary | ICD-10-CM | POA: Diagnosis not present

## 2021-05-15 DIAGNOSIS — B9689 Other specified bacterial agents as the cause of diseases classified elsewhere: Secondary | ICD-10-CM

## 2021-05-15 MED ORDER — DOXYCYCLINE HYCLATE 100 MG PO TABS: 100.0000 mg | ORAL_TABLET | Freq: Two times a day (BID) | ORAL | 0 refills | Status: DC

## 2021-05-15 NOTE — Progress Notes (Signed)
   Subjective:    Patient ID: [name], female    DOB: [birth year], 65 y.o.   MRN: [id number]  HPI HTN- chronic problem.  On Metoprolol 50mg  daily.  Took BP yesterday and it was 144/94 and this AM was 144/100.  Pt reports she had the HA when she took her BP.  HAs- pt reports frontal HAs daily x1-2 weeks.  + nausea.  Feeling 'weak'.  Pt reports the only time she has had HAs like this were sinus HAs.  + PND.  Denies maxillary pain but + frontal sinus pain.  HA resolves w/ sleep and she will develop HA as day goes on.  + pressure w/ leaning forward.  Pt reports feeling off balance.   Review of Systems For ROS see HPI   This visit occurred during the SARS-CoV-2 public health emergency.  Safety protocols were in place, including screening questions prior to the visit, additional usage of staff PPE, and extensive cleaning of exam room while observing appropriate contact time as indicated for disinfecting solutions.      Objective:   Physical Exam Vitals reviewed.  Constitutional:      General: She is not in acute distress.    Appearance: Normal appearance. She is well-developed. She is not ill-appearing.  HENT:     Head: Normocephalic and atraumatic.     Right Ear: Tympanic membrane normal.     Left Ear: Tympanic membrane normal.     Nose: Mucosal edema and rhinorrhea present.     Right Sinus: Maxillary sinus tenderness and frontal sinus tenderness present.     Left Sinus: Maxillary sinus tenderness and frontal sinus tenderness present.     Mouth/Throat:     Pharynx: Uvula midline. Posterior oropharyngeal erythema present. No oropharyngeal exudate.  Eyes:     Conjunctiva/sclera: Conjunctivae normal.     Pupils: Pupils are equal, round, and reactive to light.  Cardiovascular:     Rate and Rhythm: Normal rate and regular rhythm.     Heart sounds: Normal heart sounds.  Pulmonary:     Effort: Pulmonary effort is normal. No respiratory distress.     Breath sounds: Normal breath sounds.  No wheezing.  Musculoskeletal:     Cervical back: Normal range of motion and neck supple.  Lymphadenopathy:     Cervical: No cervical adenopathy.          Assessment & Plan:   Bacterial sinusitis- pt reports the only time she has felt similar to how she feels now was when she had a sinus infxn.  She has TTP over both frontal and maxillary sinuses, pressure w/ leaning forward, feels off balance.  Will start abx to cover possible bacterial infxn.  Pt is allergic to PCN and sulfa so we will use Doxy.  She is to continue her daily allergy medication and nasal sprays.  Pt expressed understanding and is in agreement w/ plan.

## 2021-05-15 NOTE — Patient Instructions (Signed)
Follow up by phone or MyChart if not better in the next 7 days START the Doxycycline twice daily- take w/ food Drink LOTS of fluids CONTINUE your daily allergy medications I don't think the elevated pressure is anything other than a response to how you feel, but we'll keep an eye on it Call with any questions or concerns Hang in there!

## 2021-05-15 NOTE — Assessment & Plan Note (Signed)
Pt's BP has always been well controlled on Metoprolol.  I suspect her elevated readings are more a reflection of her HA pain than truly elevated hypertensive readings.  No med changes at this time but we will continue to monitor.  Pt expressed understanding and is in agreement w/ plan.

## 2021-05-18 DIAGNOSIS — G4733 Obstructive sleep apnea (adult) (pediatric): Secondary | ICD-10-CM | POA: Diagnosis not present

## 2021-05-19 ENCOUNTER — Telehealth: Payer: Self-pay

## 2021-05-19 NOTE — Chronic Care Management (AMB) (Signed)
Chronic Care Management Pharmacy Assistant   Name: [name]  MRN: [id number] DOB: [birth year]  Reason for Encounter: Disease State - General Adherence Call  Recent office visits:  05/15/2021 OV PCP Annye Asa, MD; acute visit for bacterial sinusitis, start Doxycyline twice daily - take w/food, follow up in 7 days by phone or MyChart if not better in the next 7 days.  Recent consult visits:  None  Hospital visits:  None in previous 6 months  Medications: Outpatient Encounter Medications as of 05/19/2021  Medication Sig   Alirocumab (PRALUENT) 150 MG/ML SOAJ Inject 150 mg into the skin every 14 (fourteen) days.   ALPHAGAN P 0.1 % SOLN Place 1 drop into both eyes every 8 (eight) hours.    azelastine (ASTELIN) 0.1 % nasal spray PLACE 2 SPRAYS INTO EACH NOSTRIL TWO TIMES A DAY AS DIRECTED   budesonide-formoterol (SYMBICORT) 160-4.5 MCG/ACT inhaler Inhale 2 puffs into the lungs in the morning and at bedtime.   Cholecalciferol (VITAMIN D3) 50 MCG (2000 UT) CAPS Take 2,000 Units by mouth daily.   clonazePAM (KLONOPIN) 2 MG tablet 1 mg 2 (two) times daily.    Cyanocobalamin (VITAMIN B-12 PO) Take by mouth.   doxycycline (VIBRA-TABS) 100 MG tablet Take 1 tablet (100 mg total) by mouth 2 (two) times daily.   fenofibrate (TRICOR) 145 MG tablet Take 1 tablet (145 mg total) by mouth daily.   fexofenadine (ALLEGRA) 180 MG tablet Take 180 mg by mouth daily.   fluticasone (FLONASE) 50 MCG/ACT nasal spray SPRAY TWO SPRAYS IN EACH NOSTRIL ONCE DAILY   gabapentin (NEURONTIN) 600 MG tablet Take 1,200 mg by mouth 2 (two) times daily.    lamoTRIgine (LAMICTAL) 200 MG tablet Take 200 mg by mouth 2 (two) times daily.   LUMIGAN 0.01 % SOLN Place 1 drop into both eyes at bedtime.    meloxicam (MOBIC) 15 MG tablet Take 1 tablet (15 mg total) by mouth daily.   metoprolol succinate (TOPROL-XL) 50 MG 24 hr tablet TAKE ONE TABLET BY MOUTH DAILY WITH A MEAL   montelukast (SINGULAIR) 10 MG tablet Take  1 tablet (10 mg total) by mouth at bedtime.   Multiple Vitamin (MULTIVITAMIN WITH MINERALS) TABS tablet Take 1 tablet by mouth daily.    OLANZapine (ZYPREXA) 5 MG tablet Take 5 mg by mouth daily as needed.   omega-3 acid ethyl esters (LOVAZA) 1 g capsule TAKE TWO CAPSULES BY MOUTH TWICE A DAY   Pyridoxine HCl (VITAMIN B-6 PO) Take by mouth.   QUEtiapine (SEROQUEL XR) 300 MG 24 hr tablet Take 2 tablets (600 mg total) by mouth at bedtime.   UNABLE TO FIND CPAP   venlafaxine (EFFEXOR-XR) 75 MG 24 hr capsule Take 75 mg by mouth daily. 2 po q am, 1 po q afternoon   Vitamin D, Ergocalciferol, (DRISDOL) 1.25 MG (50000 UNIT) CAPS capsule Take 1 capsule (50,000 Units total) by mouth every 7 (seven) days for 12 doses.   No facility-administered encounter medications on file as of 05/19/2021.   Reviewed chart for medication changes ahead of adherence call.  Patient Questions: Have you had any problems recently with your health? Patient states she has not had any problems recently other than a sinus infection that is improving.  Have you had any problems with your pharmacy? Patient states she has not had any problems with her pharmacy.  What issues or side effects are you having with your medications? Patient states she recently received her application for patient assistance  program for Alphagan eye drops in the mail to fill out and return. Patient states she has been working on getting into this program since November 2021.  What would you like me to pass along to [name], CPP for him to help you with?  Patient states she does not have anything to pass along at this time.  What can we do to take care of you better? Patient states "I appreciate everything y'all do for me."  Patient declines to schedule an appointment at this time. Patient states an appointment is not needed at this time and will follow up with Edison Nasuti as needed.  Future Appointments  Date Time Provider [place]   06/09/2021  9:30 AM Donnal Moat T, PA-C [facility] None  06/23/2021 10:30 AM Valora Piccolo Barnabas Lister, RD [place] [facility]  06/26/2021  3:45 PM Brand Males, MD [facility] None  07/02/2021 10:00 AM [facility] PREVISIT RM 51 [facility]  07/16/2021 11:30 AM Gatha Mayer, MD [facility]  09/01/2021  3:00 PM [facility] COACH [facility]  09/03/2021  9:00 AM Midge Minium, MD [facility]    April D Calhoun, [place] Pharmacist Assistant [phone]    Star Rating Drugs: None  April D Calhoun, [place] Pharmacist Assistant [phone]

## 2021-05-21 ENCOUNTER — Other Ambulatory Visit: Payer: Self-pay | Admitting: Interventional Cardiology

## 2021-05-27 DIAGNOSIS — R69 Illness, unspecified: Secondary | ICD-10-CM | POA: Diagnosis not present

## 2021-06-02 DIAGNOSIS — L718 Other rosacea: Secondary | ICD-10-CM | POA: Diagnosis not present

## 2021-06-02 DIAGNOSIS — L820 Inflamed seborrheic keratosis: Secondary | ICD-10-CM | POA: Diagnosis not present

## 2021-06-02 DIAGNOSIS — L738 Other specified follicular disorders: Secondary | ICD-10-CM | POA: Diagnosis not present

## 2021-06-03 ENCOUNTER — Encounter: Payer: Self-pay | Admitting: Internal Medicine

## 2021-06-09 ENCOUNTER — Ambulatory Visit: Payer: Medicare HMO | Admitting: Physician Assistant

## 2021-06-16 NOTE — Telephone Encounter (Cosign Needed)
I, April D Calhoun, have reviewed all documentation for this visit. The documentation on 06/16/21 for the exam, diagnosis, procedures, and orders are all accurate and complete.

## 2021-06-18 DIAGNOSIS — G4733 Obstructive sleep apnea (adult) (pediatric): Secondary | ICD-10-CM | POA: Diagnosis not present

## 2021-06-19 ENCOUNTER — Other Ambulatory Visit: Payer: Self-pay | Admitting: Interventional Cardiology

## 2021-06-23 ENCOUNTER — Encounter: Payer: Medicare HMO | Attending: Family Medicine | Admitting: Dietician

## 2021-06-23 ENCOUNTER — Other Ambulatory Visit: Payer: Self-pay

## 2021-06-23 ENCOUNTER — Encounter: Payer: Self-pay | Admitting: Dietician

## 2021-06-23 DIAGNOSIS — E119 Type 2 diabetes mellitus without complications: Secondary | ICD-10-CM | POA: Insufficient documentation

## 2021-06-23 NOTE — Patient Instructions (Addendum)
Continue to stay active.  Pick a day off and go to the gym.  Get started and choose something fun to do there.  (Such as a swimming at the [facility])  Focus on fiber.    Consider witting down a usual day and counting your fiber  How close to 35 grams did you get?    Increase slowly.  Continue consistent meals. Consider 2 T ground flax seeds your oatmeal.

## 2021-06-23 NOTE — Progress Notes (Signed)
Diabetes Self-Management Education  Visit Type: Follow-up  Appt. Start Time: 1050 Appt. End Time: 1130  06/23/2021  [name], identified by name and date of birth, is a 65 y.o. female with a diagnosis of Diabetes:  .   ASSESSMENT Patient is here today alone.  She was last seen this RD 04/21/2021.  She states that she has been walking a lot (35 minutes daily) but has not been getting out to the gym.  She states that pet sitting has been stressful at times and stated that she has needed to rest more to get her energy level up. She states that she struggles to get there but would like to get out.  She would like to start on a recumbent bike and get her endurance up.  2 years ago lost to about 170 lbs in 2020 by reducing portion sizes and walking.  She states that she would like to find a part time administrative job and continue her pet sitting.  She has continued to avoid candy bars and has decreased her bread.  History includes:  Type 2 Diabetes, HTN, HLD, GERD, OSA on c-pap, constipation, GERD, bipolar. A1C 10% 03/24/2021 increased from 7% 08/09/2020  (was 5.8% 01/26/2019), GFR 92, cholesterol 148, HDL 66, LDL 44, Triglycerides 189, vitamin D 20 on 04/09/21 Medications includes vitamin D   Weight hx: 219 lbs 06/23/2021 220 lbs 04/21/2021  222 lbs 04/07/2021 231 lbs January 2019 193 lbs 06/2018 207 lbs 11/22/2018   Patient lives with her dog.  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  She worked for [employer] prior to being downsized. She struggles with finances at times. She struggles with standing for long periods of time.   She has gone to a therapist but has not gone for a while.  She states that it helps when she talks with her cousin.  Being alone a lot causes her stress.  She states that she needs more structure. She enjoys playing guitar and singing.  She writes her own songs.  Weight 219 lb (99.3 kg), last menstrual period 11/09/2000. Body mass index is 32.34  kg/m.   Diabetes Self-Management Education - 06/23/21 1730       Visit Information   Visit Type Follow-up      Psychosocial Assessment   Self-care barriers Other (comment)   stress   Other persons present Patient      Pre-Education Assessment   Patient understands the diabetes disease and treatment process. Demonstrates understanding / competency    Patient understands incorporating nutritional management into lifestyle. Needs Review    Patient undertands incorporating physical activity into lifestyle. Demonstrates understanding / competency    Patient understands using medications safely. Demonstrates understanding / competency    Patient understands monitoring blood glucose, interpreting and using results Demonstrates understanding / competency    Patient understands prevention, detection, and treatment of acute complications. Demonstrates understanding / competency    Patient understands prevention, detection, and treatment of chronic complications. Demonstrates understanding / competency    Patient understands how to develop strategies to address psychosocial issues. Needs Review    Patient understands how to develop strategies to promote health/change behavior. Needs Review      Dietary Intake   Breakfast 2 packs unsweetened instant oatmeal, blueberries, i pack artificial sweetener    Lunch small sandwich ([place] and cheese on flatbread or wrap    Snack (afternoon) fruit    Dinner chicken and vegetables or sald    Beverage(s)  water, occasional diet soda      Exercise   Exercise Type Light (walking / raking leaves)    How many days per week to you exercise? 7    How many minutes per day do you exercise? 35    Total minutes per week of exercise 245      Patient Education   Previous Diabetes Education Yes (please comment)   04/2021   Nutrition management  Meal options for control of blood glucose level and chronic complications.    Physical activity and exercise  Other  (comment)   motivation   Psychosocial adjustment Worked with patient to identify barriers to care and solutions      Individualized Goals (developed by patient)   Nutrition General guidelines for healthy choices and portions discussed    Physical Activity Exercise 5-7 days per week;45 minutes per day    Medications take my medication as prescribed    Monitoring  test my blood glucose as discussed    Reducing Risk examine blood glucose patterns;increase portions of healthy fats      Patient Self-Evaluation of Goals - Patient rates self as meeting previously set goals (% of time)   Nutrition >75%    Physical Activity >75%    Medications Not Applicable    Monitoring Not Applicable    Problem Solving >75%    Reducing Risk >75%    Health Coping >75%      Post-Education Assessment   Patient understands the diabetes disease and treatment process. Demonstrates understanding / competency    Patient understands incorporating nutritional management into lifestyle. Needs Review    Patient undertands incorporating physical activity into lifestyle. Demonstrates understanding / competency    Patient understands using medications safely. Demonstrates understanding / competency    Patient understands monitoring blood glucose, interpreting and using results Demonstrates understanding / competency    Patient understands prevention, detection, and treatment of acute complications. Demonstrates understanding / competency    Patient understands prevention, detection, and treatment of chronic complications. Demonstrates understanding / competency    Patient understands how to develop strategies to address psychosocial issues. Needs Review    Patient understands how to develop strategies to promote health/change behavior. Needs Review      Outcomes   Expected Outcomes Demonstrated interest in learning. Expect positive outcomes    Future DMSE 2 months    Program Status Not Completed      Subsequent Visit    Since your last visit have you experienced any weight changes? Loss    Weight Loss (lbs) 1             Individualized Plan for Diabetes Self-Management Training:   Learning Objective:  Patient will have a greater understanding of diabetes self-management. Patient education plan is to attend individual and/or group sessions per assessed needs and concerns.   Plan:   Patient Instructions  Continue to stay active.  Pick a day off and go to the gym.  Get started and choose something fun to do there.  (Such as a swimming at the [facility])  Focus on fiber.    Consider witting down a usual day and counting your fiber  How close to 35 grams did you get?    Increase slowly.  Continue consistent meals. Consider 2 T ground flax seeds your oatmeal. Expected Outcomes:  Demonstrated interest in learning. Expect positive outcomes  Education material provided:   If problems or questions, patient to contact team via:  Phone  Future DSME appointment:  2 months

## 2021-06-26 ENCOUNTER — Other Ambulatory Visit: Payer: Self-pay

## 2021-06-26 ENCOUNTER — Ambulatory Visit: Payer: Medicare HMO | Admitting: Internal Medicine

## 2021-06-26 ENCOUNTER — Encounter: Payer: Self-pay | Admitting: Internal Medicine

## 2021-06-26 VITALS — BP 128/80 | HR 73 | Temp 98.4°F | Ht 69.0 in | Wt 220.4 lb

## 2021-06-26 DIAGNOSIS — R69 Illness, unspecified: Secondary | ICD-10-CM | POA: Diagnosis not present

## 2021-06-26 DIAGNOSIS — J4540 Moderate persistent asthma, uncomplicated: Secondary | ICD-10-CM

## 2021-06-26 NOTE — Progress Notes (Signed)
Chief Complaint  Patient presents with   Consult    Here for CXR to see if chest is clear after having home sleep test done. On CPAP that is managed by Dr. Rexene Alberts.    66 year old never smoker with OSA referred to [place] for evaluation of nocturnal hypoxia in spite of CPAP  PMH -bipolar, anxiety, hypertension, diabetes, asthma  I have  reviewed her evaluation 02/2014 by Dr. Gwenette Greet for chronic cough more than 20 years, impression was this is related to upper airway cough syndrome or reflux, spirometry was normal, chest x-ray nml -Qvar was continued   Chest x-ray 12/2019 clear  She underwent polysomnogram which showed very mild OSA, worse in the supine position with significant nocturnal desaturations.  On a subsequent titration study CPAP was titrated to 12 cm, central apneas seem to emerge with treatment and desaturations persisted.  She was initiated with CPAP and she seems to be compliant with this, last download from 01/2021 was reviewed which shows good control of events on 10 cm.  However nocturnal oximetry showed persistent desaturations on CPAP/room air .  Hence she was referred to [place].  Patient however feels that she was referred to [place] for chest x-ray.  She absolutely does not want to get started on nocturnal oxygen.  She feels like she is breathing fine during the daytime. In fact she denies cough or dyspnea.  She goes walking with her dog.  On her previous evaluation she was noted to have a chronic cough for 20 years and she reports that this cough is completely resolved.  I wonder if she is underreporting her symptoms because she does not want to go on oxygen.  She reports asthma for many years for which she has been using Flovent and reports compliance with 2 puffs twice daily.  She rarely needs albuterol.  She reports that she does not have asthma attacks.  She does have seasonal allergies for which she takes over-the-counter medications  Epworth sleepiness score is 7. Bedtime is  between 10:11 PM, sleep latency can take a few minutes, reports 1-2 nocturnal awakenings and is out of bed by 8:30 AM feeling rested without dryness of mouth or headaches weight has fluctuated within 5 pounds  Significant tests/ events reviewed  ONO on CPAP/RA 12/09/20  Total duration of test time was 8 hours and 17 minutes, average oxygen saturation was 90%, nadir was 83%, time below or at 88% saturation was 112 minutes.    NPSG 11/2014 AHI 5.8/hour supine AHI 16/hour, lowest desaturation 77%. 01/2015 CPAP titration CPAP titrated up to 12 cm, central apneas emerged with persistent desaturations  Spirometry 02/2014 normal, FEV1 81%, FVC 82%  xxxxxxxxxxxxxxxxxxxxxxxxxxxxxxxxxxxxxxxxxxxxxxxxxxxxxxxxxxxxxxxxxxxxxxx   02/06/2021- Interim hx 65 year old female, never smoked.  Past medical history significant for asthma, allergic rhinitis, sleep-related hypoxia, GERD, diet-controlled diabetes, obesity, bipolar affective disorder, upper lipidemia, positive ANA.  Patient of Dr. Elsworth Soho, seen for initial consult on 02/03/2021  Patient is on a CPAP managed by Dr. Rexene Alberts.  She had nocturnal oximetry study on CPAP/room air which showed significant desaturations.  She had a chest x-ray that showed minimal scarring in both lungs.  Previous spirometry has been normal.  No evidence of ILD on imaging or exam.  Patient has history of mild asthma well-controlled on Flovent.  No identifiable reason for drop in oxygen level.  Patient had not wanted further testing to determine etiology of nocturnal desaturations.  Patient refused starting nocturnal oxygen.  Patient presents today for follow-up.  She was  recently seen in the office 3 days ago by Dr. Elsworth Soho. She is very anxious and overwhelmed. She would like to change providers to Dr. Chase Caller. She is compliant with CPAP use. Downlaod shows 80% compliance > 4 hours. Pressure 10cm h20 with residual AHI 3.1.  She feels hypoxia may be related to her anxiety or asthma. She  experiences anxiety/panic attacks at night. She uses Flovent inahler regularly as prescribed. She prefers Qvar but this is too expensive. She has not needed to use Albuterol rescue inhaler. She has an occasional cough and wheeze. No shortness of breath. She is agreeing to pulmonary function testing and further testing to determine origin of her sleep related hypoxia.   Airview download 01/07/21-02/05/21 Usage 28/30 days (93%); 24 days (80%) > 4 hours Average usage 6 hours 47 mins Pressure 10cm h20 Airleaks 24.3L/min (95%) AHI 3.1    Significant tests/ events reviewed ONO on CPAP/RA 12/09/20  Total duration of test time was 8 hours and 17 minutes, average oxygen saturation was 90%, nadir was 83%, time below or at 88% saturation was 112 minutes.    NPSG 11/2014 AHI 5.8/hour supine AHI 16/hour, lowest desaturation 77%. 01/2015 CPAP titration CPAP titrated up to 12 cm, central apneas emerged with persistent desaturations  Spirometry 02/2014 normal, FEV1 81%, FVC 82%  OV 03/18/2021  Subjective:  Patient ID: [name], female , DOB: 05-11-1956 , age 85 y.o. , MRN: [id number] , ADDRESS: [address] [place] [address] PCP Midge Minium, MD Patient Care Team: Midge Minium, MD as PCP - General Midge Minium, MD Megan Salon, MD as Consulting Physician (Gynecology) Jettie Booze, MD as Consulting Physician (Cardiology) Leta Baptist, MD as Consulting Physician (Otolaryngology) Brayton Caves, MD as Referring Physician (Psychiatry) Janeth Rase, NP as Nurse Practitioner (Adult Health Nurse Practitioner) Darlyne Russian, LCSW as Social Worker (Licensed Clinical Social Worker) Star Age, MD as Attending Physician (Neurology) [name], [facility] as Pharmacist (Pharmacist)  This Provider for this visit: Treatment Team:  Attending Provider: Brand Males, MD    03/18/2021 -   Chief Complaint  Patient presents with   Follow-up    PFT performed today. Pt  states she has been doing okay since last visit and denies any complaints.   Transfer of care from Dr. Elsworth Soho to Dr. Chase Caller.  History is provided by the patient and also review of the records  HPI [name] 65 y.o. -I am meeting the patient for the first time.  According to the review of the records and also history provided by the patient a few months ago she had overnight pulse oximetry's test with his CPAP on.  This was all ordered by her sleep specialist.  During this time nocturnal desaturation was noted and she was referred here.  She feels the results are not accurate because she was restless during the night did not have sleep quality that was good.  She feels anxiety played a role.  She therefore does not want to use nocturnal oxygen.  Chest x-ray is reported as clear from March 2022 other than linear scarring in the right middle and left base.  [She has obesity].  She currently uses Flovent although she prefers Qvar.  I asked her why and she said that she feels the Flovent not controlling her symptoms quite well.  In talking to her it appears that there is no nocturnal awakening with cough or chest tightness or wheezing.  There is also no dyspnea  on exertion but she does notice that when she takes a deep breath and exhale she does have wheezing.  Sometimes she will have chest chest tightness as well.  Of note she has had history of asthma for many years.  Does not know if there is allergic asthma.  She has had allergy testing many decades ago and she does not remember the results.  She used to see Dr Gwenette Greet for cough many years ago.  Review of her pulmonary function test shows a 15% bronchodilator response and a ratio that shows obstructive pattern.  Her residual volume and total lung capacity border on air trapping and hyperinflation.  Her medications include Flovent, Singulair, Flonase  FENO: 34ppb and borderline  Results for RINDI, MENESES (MRN [id number]) as of 06/26/2021  16:27  Ref. Range 03/18/2021 15:01  Sheep Sorrel IgE Latest Units: kU/L <0.10  Pecan/Hickory Tree IgE Latest Units: kU/L <0.10  IgE (Immunoglobulin E), Serum Latest Ref Range: <OR=114 kU/L 4  Allergen, D pternoyssinus,d7 Latest Units: kU/L <0.10  Cat Dander Latest Units: kU/L <0.10  Dog Dander Latest Units: kU/L <0.10  [place] Grass Latest Units: kU/L <0.10  Johnson Grass Latest Units: kU/L <0.10  Timothy Grass Latest Units: kU/L <0.10  Cockroach Latest Units: kU/L <0.10  Aspergillus fumigatus, m3 Latest Units: kU/L <0.10  Allergen, Comm Silver Wendee Copp, t9 Latest Units: kU/L <0.10  Allergen, Cottonwood, t14 Latest Units: kU/L <0.10  Elm IgE Latest Units: kU/L <0.10  Allergen, Mulberry, t76 Latest Units: kU/L <0.10  Allergen, Oak,t7 Latest Units: kU/L <0.10  COMMON RAGWEED (SHORT) (W1) IGE Latest Units: kU/L <0.10  Allergen, Mouse Urine Protein, e78 Latest Units: kU/L <0.10  D. farinae Latest Units: kU/L <0.10  Allergen, Cedar tree, t12 Latest Units: kU/L <0.10  Box Elder IgE Latest Units: kU/L <0.10  Rough Pigweed  IgE Latest Units: kU/L <0.10    OV 06/26/2021  Subjective:  Patient ID: [name], female , DOB: 1956-08-15 , age 36 y.o. , MRN: [id number] , AD[address]ESS: [address] [place] [address] PCP Midge Minium, MD Patient Care Team: Midge Minium, MD as PCP - General Midge Minium, MD Megan Salon, MD as Consulting Physician (Gynecology) Jettie Booze, MD as Consulting Physician (Cardiology) Leta Baptist, MD as Consulting Physician (Otolaryngology) Brayton Caves, MD as Referring Physician (Psychiatry) Janeth Rase, NP as Nurse Practitioner (Adult Health Nurse Practitioner) Darlyne Russian, LCSW as Social Worker (Licensed Clinical Social Worker) Star Age, MD as Attending Physician (Neurology) [name], [facility] as Pharmacist (Pharmacist)  This Provider for this visit: Treatment Team:  Attending Provider: Brand Males,  MD    06/26/2021 -   Chief Complaint  Patient presents with   Follow-up    Pt states she has been doing good since last visit and denies any complaints.     HPI [name] 65 y.o. -returns for follow-up.  Last visit escalated her inhaled corticosteroid to inhaled corticosteroid/long-acting beta agonist combination of Symbicort 160 dose.  With this she is feeling better.  Asthma control test score is 25 showing excellent control.  She feels much better.  Her blood allergy test was negative.  She barely uses albuterol.  There are no other new issues.  She is up-to-date with her respiratory vaccines.  She says she is always diligent about getting vaccination.  She does not want to do night oxygen.  She not interested in CT scan of the chest.  Asthma Control Test ACT Total Score  06/26/2021 25     Lab Results  Component Value Date   NITRICOXIDE 34 03/18/2021     CT Chest data  No results found.    PFT  PFT Results Latest Ref Rng & Units 03/18/2021  FVC-Pre L 3.48  FVC-Predicted Pre % 90  FVC-Post L 3.63  FVC-Predicted Post % 94  Pre FEV1/FVC % % 71  Post FEV1/FCV % % 79  FEV1-Pre L 2.46  FEV1-Predicted Pre % 83  FEV1-Post L 2.86  DLCO uncorrected ml/min/mmHg 20.17  DLCO UNC% % 85  DLCO corrected ml/min/mmHg 20.17  DLCO COR %Predicted % 85  DLVA Predicted % 83  TLC L 6.61  TLC % Predicted % 113  RV % Predicted % 135       has a past medical history of Allergic rhinitis, Anxiety, Arthritis, Asthma, Bipolar disorder ([place]), Cataract, Complication of anesthesia, Constipation, chronic, Depression, Diabetes mellitus without complication ([place]), Dysrhythmia, GERD (gastroesophageal reflux disease), Heart murmur, Hyperlipidemia, Hypertension, MVP (mitral valve prolapse), and Urinary tract infection.   reports that she has never smoked. She has never used smokeless tobacco.  Past Surgical History:  Procedure Laterality Date   ABDOMINAL HYSTERECTOMY  2002   no  oophorectomy   BACK SURGERY  2001   L5 - S1   CATARACT EXTRACTION     COLONOSCOPY     NASAL SINUS SURGERY  01/2010   x2   TONSILLECTOMY     TOTAL KNEE ARTHROPLASTY  12/23/2011   Procedure: TOTAL KNEE ARTHROPLASTY;  Surgeon: Ninetta Lights, MD;  Location: [facility];  Service: Orthopedics;  Laterality: Right;  DR MURPHY WANTS 120 MINUTES FOR SURGERY   TOTAL KNEE ARTHROPLASTY  08/31/2012   Procedure: TOTAL KNEE ARTHROPLASTY;  Surgeon: Ninetta Lights, MD;  Location: [facility];  Service: Orthopedics;  Laterality: Left;    Allergies  Allergen Reactions   Nitrofurantoin Monohyd Macro Nausea Only    Severe headache   Statins Other (See Comments)    Severe weakness, pain   Sulfa Antibiotics Nausea And Vomiting   Alprazolam Anxiety and Other (See Comments)    Hyper    Amoxicillin Rash   Prednisone Anxiety    Immunization History  Administered Date(s) Administered   Influenza Split 09/02/2012   Influenza,inj,Quad PF,6+ Mos 10/09/2013, 08/13/2015, 08/17/2016, 07/28/2017, 08/03/2018, 08/08/2019, 07/20/2020   Moderna Sars-Covid-2 Vaccination 02/27/2019, 01/27/2020, 09/11/2020, 04/13/2021   Pneumococcal Polysaccharide-23 09/02/2012   Tdap 07/15/2012   Zoster Recombinat (Shingrix) 11/10/2018, 06/09/2019    Family History  Problem Relation Age of Onset   Coronary artery disease Father    Heart attack Father 33   Heart disease Father    Lung disease Mother        Mycobacterium avium intracellulare   Hypertension Brother    Hyperlipidemia Brother    Alcohol abuse Brother    Anxiety disorder Brother    Stroke Maternal Grandfather    Diabetes Maternal Grandmother    Stroke Maternal Grandmother    Heart attack Paternal Grandmother    Healthy Brother    Anesthesia problems Neg Hx      Current Outpatient Medications:    Alirocumab (PRALUENT) 150 MG/ML SOAJ, Inject 150 mg into the skin every 14 (fourteen) days., Disp: 6 mL, Rfl: 3   ALPHAGAN P 0.1 % SOLN, Place 1 drop into both eyes every 8  (eight) hours. , Disp: , Rfl:    azelastine (ASTELIN) 0.1 % nasal spray, PLACE 2 SPRAYS INTO EACH NOSTRIL TWO TIMES A DAY AS DIRECTED, Disp: 30  mL, Rfl: 2   budesonide-formoterol (SYMBICORT) 160-4.5 MCG/ACT inhaler, Inhale 2 puffs into the lungs in the morning and at bedtime., Disp: 10.2 each, Rfl: 6   Cholecalciferol (VITAMIN D3) 50 MCG (2000 UT) CAPS, Take 2,000 Units by mouth daily., Disp: , Rfl:    Cholecalciferol (VITAMIN D3) 50 MCG (2000 UT) capsule, Take 2,000 Units by mouth daily., Disp: , Rfl:    clonazePAM (KLONOPIN) 2 MG tablet, 1 mg 2 (two) times daily. , Disp: , Rfl: 0   Cyanocobalamin (VITAMIN B-12 PO), Take by mouth., Disp: , Rfl:    fenofibrate (TRICOR) 145 MG tablet, Take 1 tablet (145 mg total) by mouth daily., Disp: 90 tablet, Rfl: 1   fexofenadine (ALLEGRA) 180 MG tablet, Take 180 mg by mouth daily., Disp: , Rfl:    fluticasone (FLONASE) 50 MCG/ACT nasal spray, SPRAY TWO SPRAYS IN EACH NOSTRIL ONCE DAILY, Disp: 16 g, Rfl: 3   gabapentin (NEURONTIN) 600 MG tablet, Take 1,200 mg by mouth 2 (two) times daily. , Disp: , Rfl:    lamoTRIgine (LAMICTAL) 200 MG tablet, Take 200 mg by mouth 2 (two) times daily., Disp: , Rfl:    LUMIGAN 0.01 % SOLN, Place 1 drop into both eyes at bedtime. , Disp: , Rfl:    meloxicam (MOBIC) 15 MG tablet, Take 1 tablet (15 mg total) by mouth daily., Disp: 30 tablet, Rfl: 3   metoprolol succinate (TOPROL-XL) 50 MG 24 hr tablet, TAKE ONE TABLET BY MOUTH DAILY WITH A MEAL, Disp: 90 tablet, Rfl: 2   montelukast (SINGULAIR) 10 MG tablet, Take 1 tablet (10 mg total) by mouth at bedtime., Disp: 90 tablet, Rfl: 1   Multiple Vitamin (MULTIVITAMIN WITH MINERALS) TABS tablet, Take 1 tablet by mouth daily. , Disp: , Rfl:    OLANZapine (ZYPREXA) 5 MG tablet, Take 5 mg by mouth daily as needed., Disp: , Rfl:    omega-3 acid ethyl esters (LOVAZA) 1 g capsule, TAKE TWO CAPSULES BY MOUTH TWICE A DAY **NEEDS OFFICE APPT, Disp: 30 capsule, Rfl: 0   Pyridoxine HCl (VITAMIN  B-6 PO), Take by mouth., Disp: , Rfl:    QUEtiapine (SEROQUEL XR) 300 MG 24 hr tablet, Take 2 tablets (600 mg total) by mouth at bedtime., Disp: 60 tablet, Rfl: 1   UNABLE TO FIND, CPAP, Disp: , Rfl:    venlafaxine (EFFEXOR-XR) 75 MG 24 hr capsule, Take 75 mg by mouth daily. 2 po q am, 1 po q afternoon, Disp: , Rfl:       Objective:   Vitals:   06/26/21 1545  BP: 128/80  Pulse: 73  Temp: 98.4 F (36.9 C)  TempSrc: Oral  SpO2: 96%  Weight: 220 lb 6.4 oz (100 kg)  Height: '5\' 9"'$  (1.753 m)    Estimated body mass index is 32.55 kg/m as calculated from the following:   Height as of this encounter: '5\' 9"'$  (1.753 m).   Weight as of this encounter: 220 lb 6.4 oz (100 kg).  '@WEIGHTCHANGE'$ @  [employer]   06/26/21 1545  Weight: 220 lb 6.4 oz (100 kg)     Physical Exam   General: No distress. obese Neuro: Alert and Oriented x 3. GCS 15. Speech normal Psych: Pleasant Resp:  Barrel Chest - no.  Wheeze - no, Crackles - no, No overt respiratory distress CVS: Normal heart sounds. Murmurs - no Ext: Stigmata of Connective Tissue Disease - no HEENT: Normal upper airway. PEERL +. No post nasal drip  Assessment:       ICD-10-CM   1. Moderate persistent asthma without complication  123456     2. Asthma, well controlled, moderate persistent  J45.40          Plan:     Patient Instructions  Moderate persistent asthma without complication Asthma, well controlled, moderate persistent   Asthma is  under good control after escalation to symbicort  Plan -Continue Symbicort 160/4.5, 2 puff 2 times daily scheduled - Continue using albuterol as needed - be uptodate with vaccination  Follow-up - Return in 6-9 months or sooner if needed   -ACT questionnaire and feno at follow-up    SIGNATURE    Dr. Brand Males, M.D., F.C.C.P,  Pulmonary and Critical Care Medicine Staff Physician, [place] Director - Interstitial Lung Disease  Program   Pulmonary [place] at [place], [place], [address]  Pager: [phone], If no answer or between  15:00h - 7:00h: call [phone] Telephone: [phone]  4:40 PM 06/26/2021

## 2021-06-26 NOTE — Patient Instructions (Addendum)
Moderate persistent asthma without complication Asthma, well controlled, moderate persistent   Asthma is  under good control after escalation to symbicort  Plan -Continue Symbicort 160/4.5, 2 puff 2 times daily scheduled - Continue using albuterol as needed - be uptodate with vaccination  Follow-up - Return in 6-9 months or sooner if needed   -ACT questionnaire and feno at follow-up

## 2021-07-01 ENCOUNTER — Encounter: Payer: Self-pay | Admitting: Family Medicine

## 2021-07-01 ENCOUNTER — Telehealth: Payer: Self-pay

## 2021-07-01 ENCOUNTER — Telehealth (INDEPENDENT_AMBULATORY_CARE_PROVIDER_SITE_OTHER): Payer: Medicare HMO | Admitting: Family Medicine

## 2021-07-01 DIAGNOSIS — R059 Cough, unspecified: Secondary | ICD-10-CM

## 2021-07-01 DIAGNOSIS — R0981 Nasal congestion: Secondary | ICD-10-CM | POA: Diagnosis not present

## 2021-07-01 MED ORDER — AZITHROMYCIN 250 MG PO TABS: ORAL_TABLET | ORAL | 0 refills | Status: DC

## 2021-07-01 NOTE — Patient Instructions (Signed)
-  I sent the medication(s) we discussed to your pharmacy: Meds ordered this encounter  Medications   azithromycin (ZITHROMAX) 250 MG tablet    Sig: 2 tabs day 1, then one tab daily    Dispense:  6 tablet    Refill:  0   Please use nasal saline twice daily, drink plenty of fluids, avoid dairy and continue your allergy regimen.  Please consider COVID testing.  Can use the antibiotic if worsening or not improving over the next several days.  I hope you are feeling better soon!  Seek in person care promptly if your symptoms worsen, new concerns arise or you are not improving with treatment.  It was nice to meet you today. I help [facility] out with telemedicine visits on Tuesdays and Thursdays and am available for visits on those days. If you have any concerns or questions following this visit please schedule a follow up visit with your Primary Care doctor or seek care at a local urgent care clinic to avoid delays in care.

## 2021-07-01 NOTE — Progress Notes (Signed)
Virtual Visit via Video Note  I connected with [name]  on 07/01/21 at 10:40 AM EDT by a video enabled telemedicine application and verified that I am speaking with the correct person using two identifiers.  Location patient: home, [place] Location provider:work or home office Persons participating in the virtual visit: patient, provider  I discussed the limitations of evaluation and management by telemedicine and the availability of in person appointments. The patient expressed understanding and agreed to proceed.   HPI:  Acute telemedicine visit for sinus issues: -Onset: over 1 week ago  -Symptoms include: thick nasal congestion, cough, PND, sinus headache, feels tired -Denies: fevers, body aches, CP, SOB, NVD, inability to eat/drink/get out of bed, any asthma symptoms wheezing/breathing issues -reports she has had this before and feels needs an abx as that is usually what she gets for this -reports thinks can take the zpack but not other abx due to interactions and allergies -Has tried: flonase, allegra -Pertinent past medical history: see below -Pertinent medication allergies:  Allergies  Allergen Reactions   Nitrofurantoin Monohyd Macro Nausea Only    Severe headache   Statins Other (See Comments)    Severe weakness, pain   Sulfa Antibiotics Nausea And Vomiting   Alprazolam Anxiety and Other (See Comments)    Hyper    Amoxicillin Rash   Prednisone Anxiety  -COVID-19 vaccine status: had 2 doses and 2 boosters  ROS: See pertinent positives and negatives per HPI.  Past Medical History:  Diagnosis Date   Allergic rhinitis    Anxiety    Arthritis    R knee, Left Knee   Asthma    Bipolar disorder ([place])    Cataract    Complication of anesthesia    had bad anxiety after anesthesia   Constipation, chronic    Depression    Diabetes mellitus without complication (HCC)    Dysrhythmia    Palpations occ, takes Tenormin   GERD (gastroesophageal reflux disease)    Heart murmur     Hyperlipidemia    Hypertension    MVP (mitral valve prolapse)    Urinary tract infection    frequent, see Urololgist 12/16/2010    Past Surgical History:  Procedure Laterality Date   ABDOMINAL HYSTERECTOMY  2002   no oophorectomy   BACK SURGERY  2001   L5 - S1   CATARACT EXTRACTION     COLONOSCOPY     NASAL SINUS SURGERY  01/2010   x2   TONSILLECTOMY     TOTAL KNEE ARTHROPLASTY  12/23/2011   Procedure: TOTAL KNEE ARTHROPLASTY;  Surgeon: Ninetta Lights, MD;  Location: [facility];  Service: Orthopedics;  Laterality: Right;  DR MURPHY WANTS 120 MINUTES FOR SURGERY   TOTAL KNEE ARTHROPLASTY  08/31/2012   Procedure: TOTAL KNEE ARTHROPLASTY;  Surgeon: Ninetta Lights, MD;  Location: [facility];  Service: Orthopedics;  Laterality: Left;     Current Outpatient Medications:    Alirocumab (PRALUENT) 150 MG/ML SOAJ, Inject 150 mg into the skin every 14 (fourteen) days., Disp: 6 mL, Rfl: 3   ALPHAGAN P 0.1 % SOLN, Place 1 drop into both eyes every 8 (eight) hours. , Disp: , Rfl:    azelastine (ASTELIN) 0.1 % nasal spray, PLACE 2 SPRAYS INTO EACH NOSTRIL TWO TIMES A DAY AS DIRECTED, Disp: 30 mL, Rfl: 2   azithromycin (ZITHROMAX) 250 MG tablet, 2 tabs day 1, then one tab daily, Disp: 6 tablet, Rfl: 0   budesonide-formoterol (SYMBICORT) 160-4.5 MCG/ACT inhaler, Inhale 2 puffs into  the lungs in the morning and at bedtime., Disp: 10.2 each, Rfl: 6   Cholecalciferol (VITAMIN D3) 50 MCG (2000 UT) CAPS, Take 2,000 Units by mouth daily., Disp: , Rfl:    clonazePAM (KLONOPIN) 2 MG tablet, 1 mg 2 (two) times daily. , Disp: , Rfl: 0   Cyanocobalamin (VITAMIN B-12 PO), Take by mouth., Disp: , Rfl:    fenofibrate (TRICOR) 145 MG tablet, Take 1 tablet (145 mg total) by mouth daily., Disp: 90 tablet, Rfl: 1   fexofenadine (ALLEGRA) 180 MG tablet, Take 180 mg by mouth daily., Disp: , Rfl:    fluticasone (FLONASE) 50 MCG/ACT nasal spray, SPRAY TWO SPRAYS IN EACH NOSTRIL ONCE DAILY, Disp: 16 g, Rfl: 3   gabapentin  (NEURONTIN) 600 MG tablet, Take 1,200 mg by mouth 2 (two) times daily. , Disp: , Rfl:    ISOtretinoin (CLARAVIS PO), Take by mouth daily., Disp: , Rfl:    lamoTRIgine (LAMICTAL) 200 MG tablet, Take 200 mg by mouth 2 (two) times daily., Disp: , Rfl:    LUMIGAN 0.01 % SOLN, Place 1 drop into both eyes at bedtime. , Disp: , Rfl:    meloxicam (MOBIC) 15 MG tablet, Take 1 tablet (15 mg total) by mouth daily., Disp: 30 tablet, Rfl: 3   metoprolol succinate (TOPROL-XL) 50 MG 24 hr tablet, TAKE ONE TABLET BY MOUTH DAILY WITH A MEAL, Disp: 90 tablet, Rfl: 2   montelukast (SINGULAIR) 10 MG tablet, Take 1 tablet (10 mg total) by mouth at bedtime., Disp: 90 tablet, Rfl: 1   Multiple Vitamin (MULTIVITAMIN WITH MINERALS) TABS tablet, Take 1 tablet by mouth daily. , Disp: , Rfl:    OLANZapine (ZYPREXA) 5 MG tablet, Take 5 mg by mouth daily as needed., Disp: , Rfl:    omega-3 acid ethyl esters (LOVAZA) 1 g capsule, TAKE TWO CAPSULES BY MOUTH TWICE A DAY  Disp: 30 capsule, Rfl: 0   Pyridoxine HCl (VITAMIN B-6 PO), Take by mouth., Disp: , Rfl:    QUEtiapine (SEROQUEL XR) 300 MG 24 hr tablet, Take 2 tablets (600 mg total) by mouth at bedtime., Disp: 60 tablet, Rfl: 1   UNABLE TO FIND, CPAP, Disp: , Rfl:    venlafaxine (EFFEXOR-XR) 75 MG 24 hr capsule, Take 75 mg by mouth daily. 2 po q am, 1 po q afternoon, Disp: , Rfl:   EXAM:  VITALS per patient if applicable:  GENERAL: alert, oriented, appears well and in no acute distress  HEENT: atraumatic, conjunttiva clear, no obvious abnormalities on inspection of external nose and ears  NECK: normal movements of the head and neck  LUNGS: on inspection no signs of respiratory distress, breathing rate appears normal, no obvious gross SOB, gasping or wheezing  CV: no obvious cyanosis  MS: moves all visible extremities without noticeable abnormality  PSYCH/NEURO: pleasant and cooperative, no obvious depression or anxiety, speech and thought processing grossly  intact  ASSESSMENT AND PLAN:  Discussed the following assessment and plan:  Nasal congestion  Cough  -we discussed possible serious and likely etiologies, options for evaluation and workup, limitations of telemedicine visit vs in person visit, treatment, treatment risks and precautions. Pt prefers to treat via telemedicine empirically rather than in person at this moment.  Query COVID-19, viral upper respiratory illness, possible developing sinusitis versus other.  She feels strongly that this is a sinus infection.  She has opted to try nasal saline twice daily, continue her allergy regimen and if worsening take an antibiotic.  She prefers the Z-Pak  due to other interactions and drug allergies.  Did advise she also take a COVID test, or to test if doing home tests. Advised to seek prompt in person care if worsening, new symptoms arise, or if is not improving with treatment. Discussed options for inperson care if PCP office not available. Did let this patient know that I only do telemedicine on Tuesdays and Thursdays for [place]. Advised to schedule follow up visit with PCP or [facility] if any further questions or concerns to avoid delays in care.   I discussed the assessment and treatment plan with the patient. The patient was provided an opportunity to ask questions and all were answered. The patient agreed with the plan and demonstrated an understanding of the instructions.     Lucretia Kern, DO

## 2021-07-01 NOTE — Progress Notes (Addendum)
    Chronic Care Management Pharmacy Assistant   Name: [name]  MRN: [id number] DOB: 01-02-1956   Reason for Encounter: Chart Review / PAP update  Performed chart review and per [id number] chart note application for both [facility] and Alphagan were completed and waiting for patient to pick up from office and provide to Dr Mellissa Kohut office to be sent. Called and left message for patient to return call to discuss updated status of the applications so we can update and track for patient. Dwana Curd   Jobe Gibbon, [place] Pharmacist Assistant  [phone]  Time Spent: 11 minutes

## 2021-07-02 ENCOUNTER — Ambulatory Visit (AMBULATORY_SURGERY_CENTER): Payer: Self-pay | Admitting: *Deleted

## 2021-07-02 ENCOUNTER — Other Ambulatory Visit: Payer: Self-pay

## 2021-07-02 VITALS — Ht 69.0 in | Wt 216.8 lb

## 2021-07-02 DIAGNOSIS — Z1211 Encounter for screening for malignant neoplasm of colon: Secondary | ICD-10-CM

## 2021-07-02 NOTE — Progress Notes (Signed)
  Per chart, pt had anxiety after surgery but pt denies any issues  denies being told they were difficult to intubate, or hx/fam hx of malignant hyperthermia per pt   No egg or soy allergy  No home oxygen use   No medications for weight loss taken  emmi information given Pt has constipation- 2 day Miralax/Miralax prep given

## 2021-07-03 ENCOUNTER — Telehealth: Payer: Self-pay | Admitting: Interventional Cardiology

## 2021-07-03 ENCOUNTER — Other Ambulatory Visit: Payer: Self-pay

## 2021-07-03 MED ORDER — OMEGA-3-ACID ETHYL ESTERS 1 G PO CAPS: 2.0000 g | ORAL_CAPSULE | Freq: Two times a day (BID) | ORAL | 0 refills | Status: DC

## 2021-07-03 NOTE — Telephone Encounter (Signed)
*  STAT* If patient is at the pharmacy, call can be transferred to refill team.   1. Which medications need to be refilled? (please list name of each medication and dose if known) omega-3 acid ethyl esters (LOVAZA) 1 g capsule  2. Which pharmacy/location (including street and city if local pharmacy) is medication to be sent to? [id number] - [place], [address]  3. Do they need a 30 day or 90 day supply? 30 day   Patient has an appointment 07/23/2021

## 2021-07-09 ENCOUNTER — Other Ambulatory Visit: Payer: Self-pay

## 2021-07-09 DIAGNOSIS — E782 Mixed hyperlipidemia: Secondary | ICD-10-CM

## 2021-07-09 MED ORDER — FENOFIBRATE 145 MG PO TABS: 145.0000 mg | ORAL_TABLET | Freq: Every day | ORAL | 1 refills | Status: DC

## 2021-07-10 DIAGNOSIS — Z86010 Personal history of colonic polyps: Secondary | ICD-10-CM | POA: Insufficient documentation

## 2021-07-11 DIAGNOSIS — G4733 Obstructive sleep apnea (adult) (pediatric): Secondary | ICD-10-CM | POA: Diagnosis not present

## 2021-07-15 ENCOUNTER — Telehealth: Payer: Self-pay

## 2021-07-15 NOTE — Progress Notes (Signed)
Chronic Care Management Pharmacy Assistant   Name: [name]  MRN: [id number] DOB: 01-Mar-1956   Reason for Encounter: Disease State - General Adherence Call     Recent office visits:  07/01/21 Colin Benton, DO - Family Medicine (Video Visit) - Nasal congestion - Azithromycin (ZITHROMAX) 250 MG tablet 2 tabs day 1, then one tab daily prescribed.   Recent consult visits:  06/26/21 Brand Males, MD - Pulmonology - Moderate persistent asthma w/o complication - No medication changes. Follow up in 6-9 months.  Hospital visits:  None in previous 6 months  Medications: Outpatient Encounter Medications as of 07/15/2021  Medication Sig   Alirocumab (PRALUENT) 150 MG/ML SOAJ Inject 150 mg into the skin every 14 (fourteen) days.   ALPHAGAN P 0.1 % SOLN Place 1 drop into both eyes every 8 (eight) hours.    azelastine (ASTELIN) 0.1 % nasal spray PLACE 2 SPRAYS INTO EACH NOSTRIL TWO TIMES A DAY AS DIRECTED   azithromycin (ZITHROMAX) 250 MG tablet 2 tabs day 1, then one tab daily (Patient taking differently: 2 tabs day 1, then one tab daily- has available if needed)   budesonide-formoterol (SYMBICORT) 160-4.5 MCG/ACT inhaler Inhale 2 puffs into the lungs in the morning and at bedtime.   Cholecalciferol (VITAMIN D3) 50 MCG (2000 UT) CAPS Take 2,000 Units by mouth daily.   clonazePAM (KLONOPIN) 2 MG tablet 1 mg 2 (two) times daily.    Cyanocobalamin (VITAMIN B-12 PO) Take by mouth.   EVENING PRIMROSE OIL PO Take by mouth daily.   fenofibrate (TRICOR) 145 MG tablet Take 1 tablet (145 mg total) by mouth daily.   fexofenadine (ALLEGRA) 180 MG tablet Take 180 mg by mouth daily.   fluticasone (FLONASE) 50 MCG/ACT nasal spray SPRAY TWO SPRAYS IN EACH NOSTRIL ONCE DAILY   gabapentin (NEURONTIN) 600 MG tablet Take 1,200 mg by mouth 2 (two) times daily.    ISOtretinoin (CLARAVIS PO) Take by mouth daily.   lamoTRIgine (LAMICTAL) 200 MG tablet Take 200 mg by mouth 2 (two) times daily.   LUMIGAN 0.01 %  SOLN Place 1 drop into both eyes at bedtime.    meloxicam (MOBIC) 15 MG tablet Take 1 tablet (15 mg total) by mouth daily.   metoprolol succinate (TOPROL-XL) 50 MG 24 hr tablet TAKE ONE TABLET BY MOUTH DAILY WITH A MEAL   montelukast (SINGULAIR) 10 MG tablet Take 1 tablet (10 mg total) by mouth at bedtime.   Multiple Vitamin (MULTIVITAMIN WITH MINERALS) TABS tablet Take 1 tablet by mouth daily.    OLANZapine (ZYPREXA) 5 MG tablet Take 5 mg by mouth daily as needed.   omega-3 acid ethyl esters (LOVAZA) 1 g capsule Take 2 capsules (2 g total) by mouth 2 (two) times daily.   OVER THE COUNTER MEDICATION [place]na tablets- 8.6 mg daily   Pyridoxine HCl (VITAMIN B-6 PO) Take by mouth.   QUEtiapine (SEROQUEL XR) 300 MG 24 hr tablet Take 2 tablets (600 mg total) by mouth at bedtime. (Patient taking differently: Take 600 mg by mouth at bedtime. Taking 1 400 mg tablet at bedtime, takes 50 mg daily as needed)   UNABLE TO FIND CPAP   venlafaxine (EFFEXOR-XR) 75 MG 24 hr capsule Take 75 mg by mouth daily. 2 po q am, 1 po q afternoon   No facility-administered encounter medications on file as of 07/15/2021.    Have you had any problems recently with your health? Patient denied having problems with her current health.   Have you  had any problems with your pharmacy? Patient denied having any issues with her current pharmacy.  What issues or side effects are you having with your medications? Patient denied any side effects or issues with her current medications.  What would you like me to pass along to [name], CPP for them to help you with?  Patient lost PAP form for Alphagan . She requested to have another sent to her to fill out and mail in the office to be completed.   What can we do to take care of you better? Patient would like new PAP form mailed to her. Will work on this and get mailed to patient this week. Patient is aware and appreciative. Will complete her portion and return back to the office.     Care Gaps  Colonoscopy: done Cologuard 01/26/19 (pt reported scheduled for colonoscopy 07/16/21) DEXA:done 07/11/15 Influenza:due 06/09/21 Mammogram:due 04/29/23 Diabetes Eye Exam:due 08/02/21 Annual Well Visit: PAP: done 07/11/15 Tetanus: due 07/15/22 Zoster: completed  HIV screening: never PSA:N/A   Star Rating Drugs: No Star Rating Drugs noted.  Future Appointments  Date Time Provider [place]  07/16/2021 11:30 AM Gatha Mayer, MD [facility]  07/23/2021  9:20 AM Jettie Booze, MD [facility]  08/25/2021 10:30 AM Valora Piccolo Barnabas Lister, RD [place] [facility]  09/01/2021  3:00 PM [facility]  09/03/2021  9:00 AM Midge Minium, MD [facility] [place], [place] Pharmacist Assistant  [phone]  Time Spent: 35 minutes

## 2021-07-16 ENCOUNTER — Other Ambulatory Visit: Payer: Self-pay

## 2021-07-16 ENCOUNTER — Ambulatory Visit (AMBULATORY_SURGERY_CENTER): Payer: Medicare HMO | Admitting: Internal Medicine

## 2021-07-16 ENCOUNTER — Encounter: Payer: Self-pay | Admitting: Internal Medicine

## 2021-07-16 VITALS — BP 118/79 | HR 60 | Temp 99.6°F | Resp 11 | Ht 69.0 in | Wt 216.8 lb

## 2021-07-16 DIAGNOSIS — D125 Benign neoplasm of sigmoid colon: Secondary | ICD-10-CM

## 2021-07-16 DIAGNOSIS — R69 Illness, unspecified: Secondary | ICD-10-CM | POA: Diagnosis not present

## 2021-07-16 DIAGNOSIS — D128 Benign neoplasm of rectum: Secondary | ICD-10-CM

## 2021-07-16 DIAGNOSIS — J301 Allergic rhinitis due to pollen: Secondary | ICD-10-CM

## 2021-07-16 DIAGNOSIS — E119 Type 2 diabetes mellitus without complications: Secondary | ICD-10-CM | POA: Diagnosis not present

## 2021-07-16 DIAGNOSIS — D123 Benign neoplasm of transverse colon: Secondary | ICD-10-CM

## 2021-07-16 DIAGNOSIS — Z86010 Personal history of colonic polyps: Secondary | ICD-10-CM

## 2021-07-16 DIAGNOSIS — J45909 Unspecified asthma, uncomplicated: Secondary | ICD-10-CM | POA: Diagnosis not present

## 2021-07-16 DIAGNOSIS — K621 Rectal polyp: Secondary | ICD-10-CM | POA: Diagnosis not present

## 2021-07-16 DIAGNOSIS — D122 Benign neoplasm of ascending colon: Secondary | ICD-10-CM | POA: Diagnosis not present

## 2021-07-16 DIAGNOSIS — G4733 Obstructive sleep apnea (adult) (pediatric): Secondary | ICD-10-CM | POA: Diagnosis not present

## 2021-07-16 MED ORDER — SODIUM CHLORIDE 0.9 % IV SOLN: 500.0000 mL | Freq: Once | INTRAVENOUS | Status: DC

## 2021-07-16 MED ORDER — FLUTICASONE PROPIONATE 50 MCG/ACT NA SUSP: NASAL | 3 refills | Status: DC

## 2021-07-16 NOTE — Progress Notes (Signed)
[facility] History and Physical   Primary Care Physician:  Midge Minium, MD   Reason for Procedure:   Hx adenomatous colonic polyps  Plan:    colonoscopy     HPI: [name] is a 66 y.o. female w/ prior colonoscopy and polypectomy in 2008 - multiple adenomas max 6 mm were removed. Here for surveillance colonoscopy.   Past Medical History:  Diagnosis Date   Allergic rhinitis    Allergy    Anxiety    Arthritis    R knee, Left Knee   Asthma    Bipolar disorder ([place])    Cataract    Complication of anesthesia    had bad anxiety after anesthesia   Constipation, chronic    Depression    Diabetes mellitus without complication (HCC)    Dysrhythmia    Palpations occ, takes Tenormin   GERD (gastroesophageal reflux disease)    Glaucoma    Heart murmur    Hyperlipidemia    Hypertension    MVP (mitral valve prolapse)    Sleep apnea    wears CPAP   Urinary tract infection    frequent, see Urololgist 12/16/2010    Past Surgical History:  Procedure Laterality Date   ABDOMINAL HYSTERECTOMY  2002   no oophorectomy   BACK SURGERY  2001   L5 - S1   CATARACT EXTRACTION     COLONOSCOPY     NASAL SINUS SURGERY  01/2010   x2   TONSILLECTOMY     TOTAL KNEE ARTHROPLASTY  12/23/2011   Procedure: TOTAL KNEE ARTHROPLASTY;  Surgeon: Ninetta Lights, MD;  Location: [employer];  Service: Orthopedics;  Laterality: Right;  DR MURPHY WANTS 120 MINUTES FOR SURGERY   TOTAL KNEE ARTHROPLASTY  08/31/2012   Procedure: TOTAL KNEE ARTHROPLASTY;  Surgeon: Ninetta Lights, MD;  Location: [place];  Service: Orthopedics;  Laterality: Left;    Prior to Admission medications   Medication Sig Start Date End Date Taking? Authorizing Provider  Alirocumab (PRALUENT) 150 MG/ML SOAJ Inject 150 mg into the skin every 14 (fourteen) days. 12/25/20  Yes Jettie Booze, MD  ALPHAGAN P 0.1 % SOLN Place 1 drop into both eyes every 8 (eight) hours.  05/03/20  Yes [provider]   azelastine (ASTELIN) 0.1 % nasal spray PLACE 2 SPRAYS INTO EACH NOSTRIL TWO TIMES A DAY AS DIRECTED 05/20/20  Yes Midge Minium, MD  budesonide-formoterol [facility]) 160-4.5 MCG/ACT inhaler Inhale 2 puffs into the lungs in the morning and at bedtime. 03/18/21  Yes Brand Males, MD  Cholecalciferol (VITAMIN D3) 50 MCG (2000 UT) CAPS Take 2,000 Units by mouth daily.   Yes [provider]  CLARAVIS 20 MG capsule Take 20 mg by mouth daily. 07/11/21  Yes [provider]  clonazePAM (KLONOPIN) 2 MG tablet 1 mg 2 (two) times daily.  10/23/15  Yes [provider]  Cyanocobalamin (VITAMIN B-12 PO) Take by mouth.   Yes [provider]  fenofibrate (TRICOR) 145 MG tablet Take 1 tablet (145 mg total) by mouth daily. 07/09/21  Yes Midge Minium, MD  fexofenadine (ALLEGRA) 180 MG tablet Take 180 mg by mouth daily.   Yes [provider]  fluticasone (FLONASE) 50 MCG/ACT nasal spray SPRAY TWO SPRAYS IN EACH NOSTRIL ONCE DAILY 01/13/21  Yes Midge Minium, MD  gabapentin (NEURONTIN) 600 MG tablet Take 1,200 mg by mouth 2 (two) times daily.    Yes [provider]  lamoTRIgine (LAMICTAL) 200 MG tablet  Take 200 mg by mouth 2 (two) times daily.   Yes [provider]  LUMIGAN 0.01 % SOLN Place 1 drop into both eyes at bedtime.  10/19/19  Yes [provider]  meloxicam (MOBIC) 15 MG tablet Take 1 tablet (15 mg total) by mouth daily. 03/18/21  Yes Midge Minium, MD  metoprolol succinate (TOPROL-XL) 50 MG 24 hr tablet TAKE ONE TABLET BY MOUTH DAILY WITH A MEAL 11/19/20  Yes Midge Minium, MD  montelukast (SINGULAIR) 10 MG tablet Take 1 tablet (10 mg total) by mouth at bedtime. 07/12/20  Yes Midge Minium, MD  OLANZapine (ZYPREXA) 5 MG tablet Take 5 mg by mouth daily as needed.   Yes [provider]  omega-3 acid ethyl esters (LOVAZA) 1 g capsule Take 2 capsules (2 g total) by mouth 2 (two) times daily. 07/03/21   Yes Jettie Booze, MD  Pyridoxine HCl (VITAMIN B-6 PO) Take by mouth.   Yes [provider]  QUEtiapine (SEROQUEL XR) 300 MG 24 hr tablet Take 2 tablets (600 mg total) by mouth at bedtime. Patient taking differently: Take 600 mg by mouth at bedtime. Taking 1 400 mg tablet at bedtime, takes 50 mg daily as needed 04/09/21  Yes Hurst, Helene Kelp T, PA-C  UNABLE TO FIND CPAP   Yes [provider]  venlafaxine (EFFEXOR-XR) 75 MG 24 hr capsule Take 75 mg by mouth daily. 2 po q am, 1 po q afternoon   Yes [provider]  ISOtretinoin (CLARAVIS PO) Take by mouth daily.    [provider]  Multiple Vitamin (MULTIVITAMIN WITH MINERALS) TABS tablet Take 1 tablet by mouth daily.  Patient not taking: Reported on 07/16/2021    [provider]  OVER THE COUNTER MEDICATION Senna tablets- 8.6 mg daily Patient not taking: Reported on 07/16/2021    [provider]    Current Outpatient Medications  Medication Sig Dispense Refill   Alirocumab (PRALUENT) 150 MG/ML SOAJ Inject 150 mg into the skin every 14 (fourteen) days. 6 mL 3   ALPHAGAN P 0.1 % SOLN Place 1 drop into both eyes every 8 (eight) hours.      azelastine (ASTELIN) 0.1 % nasal spray PLACE 2 SPRAYS INTO EACH NOSTRIL TWO TIMES A DAY AS DIRECTED 30 mL 2   budesonide-formoterol (SYMBICORT) 160-4.5 MCG/ACT inhaler Inhale 2 puffs into the lungs in the morning and at bedtime. 10.2 each 6   Cholecalciferol (VITAMIN D3) 50 MCG (2000 UT) CAPS Take 2,000 Units by mouth daily.     CLARAVIS 20 MG capsule Take 20 mg by mouth daily.     clonazePAM (KLONOPIN) 2 MG tablet 1 mg 2 (two) times daily.   0   Cyanocobalamin (VITAMIN B-12 PO) Take by mouth.     fenofibrate (TRICOR) 145 MG tablet Take 1 tablet (145 mg total) by mouth daily. 90 tablet 1   fexofenadine (ALLEGRA) 180 MG tablet Take 180 mg by mouth daily.     fluticasone (FLONASE) 50 MCG/ACT nasal spray SPRAY TWO SPRAYS IN EACH NOSTRIL ONCE DAILY 16 g 3    gabapentin (NEURONTIN) 600 MG tablet Take 1,200 mg by mouth 2 (two) times daily.      lamoTRIgine (LAMICTAL) 200 MG tablet Take 200 mg by mouth 2 (two) times daily.     LUMIGAN 0.01 % SOLN Place 1 drop into both eyes at bedtime.      meloxicam (MOBIC) 15 MG tablet Take 1 tablet (15 mg total) by mouth daily. [place]  tablet 3   metoprolol succinate (TOPROL-XL) 50 MG 24 hr tablet TAKE ONE TABLET BY MOUTH DAILY WITH A MEAL 90 tablet 2   montelukast (SINGULAIR) 10 MG tablet Take 1 tablet (10 mg total) by mouth at bedtime. 90 tablet 1   OLANZapine (ZYPREXA) 5 MG tablet Take 5 mg by mouth daily as needed.     omega-3 acid ethyl esters (LOVAZA) 1 g capsule Take 2 capsules (2 g total) by mouth 2 (two) times daily. 360 capsule 0   Pyridoxine HCl (VITAMIN B-6 PO) Take by mouth.     QUEtiapine (SEROQUEL XR) 300 MG 24 hr tablet Take 2 tablets (600 mg total) by mouth at bedtime. (Patient taking differently: Take 600 mg by mouth at bedtime. Taking 1 400 mg tablet at bedtime, takes 50 mg daily as needed) 60 tablet 1   UNABLE TO FIND CPAP     venlafaxine (EFFEXOR-XR) 75 MG 24 hr capsule Take 75 mg by mouth daily. 2 po q am, 1 po q afternoon     ISOtretinoin (CLARAVIS PO) Take by mouth daily.     Multiple Vitamin (MULTIVITAMIN WITH MINERALS) TABS tablet Take 1 tablet by mouth daily.  (Patient not taking: Reported on 07/16/2021)     OVER THE COUNTER MEDICATION Senna tablets- 8.6 mg daily (Patient not taking: Reported on 07/16/2021)     Current Facility-Administered Medications  Medication Dose Route Frequency Provider Last Rate Last Admin   0.9 %  sodium chloride infusion  500 mL Intravenous Once Gatha Mayer, MD        Allergies as of 07/16/2021 - Review Complete 07/16/2021  Allergen Reaction Noted   Nitrofurantoin monohyd macro Nausea Only 11/17/2011   Statins Other (See Comments) 09/02/2017   Sulfa antibiotics Nausea And Vomiting 08/23/2012   Alprazolam Anxiety and Other (See Comments) 10/10/2008   Amoxicillin  Rash 02/20/2013   Prednisone Anxiety 10/30/2013    Family History  Problem Relation Age of Onset   Lung disease Mother        Mycobacterium avium intracellulare   Coronary artery disease Father    Heart attack Father 24   Heart disease Father    Hypertension Brother    Hyperlipidemia Brother    Alcohol abuse Brother    Anxiety disorder Brother    Healthy Brother    Diabetes Maternal Grandmother    Stroke Maternal Grandmother    Stroke Maternal Grandfather    Heart attack Paternal Grandmother    Anesthesia problems Neg Hx    Colon cancer Neg Hx    Esophageal cancer Neg Hx    Rectal cancer Neg Hx    Stomach cancer Neg Hx     Social History   Socioeconomic History   Marital status: Single    Spouse name: Not on file   Number of children: 0   Years of education: 13   Highest education level: Some college, no degree  Occupational History   Occupation: retired//disability    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 No kids. Grew up in [place]. Had a great childhood. Has 2 older brothers.   Mom was a stay at home mom. Her dad worked at [employer] as a Banker. He died when pt was 65yo.    Never abused.       Disabled d/t Bipolar since 1992.   Religion-Christian   No legal issues.    Lives alone. Has 1 dog. Has pet sitting business.   2 cups of coffee a day.   Social Determinants of Health   Financial Resource Strain: Low Risk    Difficulty of Paying Living Expenses: Not very hard  Food Insecurity: No Food Insecurity   Worried About Charity fundraiser in the Last  Year: Never true   Ran Out of Food in the Last Year: Never true  Transportation Needs: No Transportation Needs   Lack of Transportation (Medical): No   Lack of Transportation (Non-Medical): No  Physical Activity: Sufficiently Active   Days of Exercise per Week: 7 days   Minutes of Exercise per Session: 30 min  Stress: Stress Concern Present   Feeling of Stress : Rather much  Social Connections: Socially Isolated   Frequency of Communication with Friends and Family: Twice a week   Frequency of Social Gatherings with Friends and Family: Never   Attends Religious Services: 1 to 4 times per year   Active Member of [employer] or Organizations: No   Attends Music therapist: Never   Marital Status: Divorced  Human resources officer Violence: Not At Risk   Fear of Current or Ex-Partner: No   Emotionally Abused: No   Physically Abused: No   Sexually Abused: No    Review of Systems: Otherwise negative  Physical Exam: Vital signs BP 124/88   Pulse 76   Temp 99.6 F (37.6 C) (Skin)   Ht '5\' 9"'$  (1.753 m)   Wt 216 lb 12.8 oz (98.3 kg)   LMP 11/09/2000   SpO2 94%   BMI 32.02 kg/m   General:   Alert,  Well-developed, well-nourished, pleasant and cooperative in NAD Lungs:  Clear throughout to auscultation.   Heart:  Regular rate and rhythm; no murmurs, clicks, rubs,  or gallops. Abdomen:  Soft, nontender and nondistended. Normal bowel sounds.   Neuro/Psych:  Alert and cooperative. Normal mood and affect. A and O x 3   '@Deyci Gesell'$  Simonne Maffucci, MD, [facility] [phone] (pager) 07/16/2021 11:31 AM@

## 2021-07-16 NOTE — Progress Notes (Signed)
Called to room to assist during endoscopic procedure.  Patient ID and intended procedure confirmed with present staff. Received instructions for my participation in the procedure from the performing physician.  

## 2021-07-16 NOTE — Op Note (Signed)
[place] Patient Name: [name] Procedure Date: 07/16/2021 11:27 AM MRN: [id number] Endoscopist: Gatha Mayer , MD Age: 65 Referring MD:  Date of Birth: January 20, 1956 Gender: Female Account #: [id number] Procedure:                Colonoscopy Indications:              Surveillance: Personal history of adenomatous                            polyps on last colonoscopy > 5 years ago Medicines:                Propofol per Anesthesia, Monitored Anesthesia Care Procedure:                Pre-Anesthesia Assessment:                           - Prior to the procedure, a History and Physical                            was performed, and patient medications and                            allergies were reviewed. The patient's tolerance of                            previous anesthesia was also reviewed. The risks                            and benefits of the procedure and the sedation                            options and risks were discussed with the patient.                            All questions were answered, and informed consent                            was obtained. Prior Anticoagulants: The patient has                            taken no previous anticoagulant or antiplatelet                            agents. ASA Grade Assessment: II - A patient with                            mild systemic disease. After reviewing the risks                            and benefits, the patient was deemed in                            satisfactory condition to undergo the procedure.  After obtaining informed consent, the colonoscope                            was passed under direct vision. Throughout the                            procedure, the patient's blood pressure, pulse, and                            oxygen saturations were monitored continuously. The                            PCF-HQ190L Colonoscope was introduced through the                             anus and advanced to the the cecum, identified by                            appendiceal orifice and ileocecal valve. The                            colonoscopy was performed without difficulty. The                            patient tolerated the procedure well. The quality                            of the bowel preparation was good. The ileocecal                            valve, appendiceal orifice, and rectum were                            photographed. The bowel preparation used was                            Miralax via split dose instruction. Scope In: 11:39:09 AM Scope Out: 12:07:20 PM Scope Withdrawal Time: 0 hours 22 minutes 14 seconds  Total Procedure Duration: 0 hours 28 minutes 11 seconds  Findings:                 The perianal and digital rectal examinations were                            normal.                           18 sessile polyps were found in the rectum, sigmoid                            colon, transverse colon and ascending colon. The                            polyps were diminutive in size. These polyps were  removed with a cold snare. Resection and retrieval                            were complete. Verification of patient                            identification for the specimen was done. Estimated                            blood loss was minimal.                           The exam was otherwise without abnormality on                            direct and retroflexion views. Complications:            No immediate complications. Estimated Blood Loss:     Estimated blood loss was minimal. Impression:               - 18 diminutive polyps in the rectum, in the                            sigmoid colon, in the transverse colon and in the                            ascending colon, removed with a cold snare.                            Resected and retrieved.                           - The examination was otherwise normal on direct                             and retroflexion views.                           - Personal history of colonic polyps 3 adenomas all                            small 2008. Recommendation:           - Patient has a contact number available for                            emergencies. The signs and symptoms of potential                            delayed complications were discussed with the                            patient. Return to normal activities tomorrow.                            Written discharge instructions were provided  to the                            patient.                           - Resume previous diet.                           - Continue present medications.                           - Await pathology results.                           - Repeat colonoscopy is recommended for                            surveillance. The colonoscopy date will be                            determined after pathology results from today's                            exam become available for review. Gatha Mayer, MD 07/16/2021 12:23:46 PM This report has been signed electronically.

## 2021-07-16 NOTE — Patient Instructions (Addendum)
I found and removed 18 tiny polyps.  I will let you know pathology results and when to have another routine colonoscopy by mail and/or My Chart.  I appreciate the opportunity to care for you. Gatha Mayer, MD, [facility]   Handout on polyps given to patient. Await pathology results.  Repeat colonoscopy will be determined based off of pathology results.   YOU HAD AN ENDOSCOPIC PROCEDURE TODAY AT [facility]:   Refer to the procedure report that was given to you for any specific questions about what was found during the examination.  If the procedure report does not answer your questions, please call your gastroenterologist to clarify.  If you requested that your care partner not be given the details of your procedure findings, then the procedure report has been included in a sealed envelope for you to review at your convenience later.  YOU SHOULD EXPECT: Some feelings of bloating in the abdomen. Passage of more gas than usual.  Walking can help get rid of the air that was put into your GI tract during the procedure and reduce the bloating. If you had a lower endoscopy (such as a colonoscopy or flexible sigmoidoscopy) you may notice spotting of blood in your stool or on the toilet paper. If you underwent a bowel prep for your procedure, you may not have a normal bowel movement for a few days.  Please Note:  You might notice some irritation and congestion in your nose or some drainage.  This is from the oxygen used during your procedure.  There is no need for concern and it should clear up in a day or so.  SYMPTOMS TO REPORT IMMEDIATELY:  Following lower endoscopy (colonoscopy or flexible sigmoidoscopy):  Excessive amounts of blood in the stool  Significant tenderness or worsening of abdominal pains  Swelling of the abdomen that is new, acute  Fever of 100F or higher   For urgent or emergent issues, a gastroenterologist can be reached at any hour by calling [phone]. Do  not use MyChart messaging for urgent concerns.    DIET:  We do recommend a small meal at first, but then you may proceed to your regular diet.  Drink plenty of fluids but you should avoid alcoholic beverages for 24 hours.  ACTIVITY:  You should plan to take it easy for the rest of today and you should NOT DRIVE or use heavy machinery until tomorrow (because of the sedation medicines used during the test).    FOLLOW UP: Our staff will call the number listed on your records 48-72 hours following your procedure to check on you and address any questions or concerns that you may have regarding the information given to you following your procedure. If we do not reach you, we will leave a message.  We will attempt to reach you two times.  During this call, we will ask if you have developed any symptoms of COVID 19. If you develop any symptoms (ie: fever, flu-like symptoms, shortness of breath, cough etc.) before then, please call [phone].  If you test positive for Covid 19 in the 2 weeks post procedure, please call and report this information to [place].    If any biopsies were taken you will be contacted by phone or by letter within the next 1-3 weeks.  Please call us at [phone] if you have not heard about the biopsies in 3 weeks.    SIGNATURES/CONFIDENTIALITY: You and/or your care partner have signed paperwork which will  be entered into your electronic medical record.  These signatures attest to the fact that that the information above on your After Visit Summary has been reviewed and is understood.  Full responsibility of the confidentiality of this discharge information lies with you and/or your care-partner.

## 2021-07-16 NOTE — Progress Notes (Signed)
Report to PACU, RN, vss, BBS= Clear.  

## 2021-07-16 NOTE — Progress Notes (Signed)
Pt's states no medical or surgical changes since previsit or office visit. VS assessed by D.T 

## 2021-07-17 ENCOUNTER — Telehealth: Payer: Self-pay

## 2021-07-17 NOTE — Progress Notes (Signed)
    Chronic Care Management Pharmacy Assistant   Name: [name]  MRN: [id number] DOB: 1956-04-06   Reason for Encounter: PAP application for Alphagan   Patient lost previous PAP application. New application was initiated and filled out and mailed to patient to complete. She will return to prescribing providers office for completion and to be faxed in for processing upon her completing her potion.   Jobe Gibbon, [place] Pharmacist Assistant  [phone]  Time Spent: 20 minutes

## 2021-07-18 ENCOUNTER — Telehealth: Payer: Self-pay

## 2021-07-18 NOTE — Telephone Encounter (Signed)
  Follow up Call-  Call back number 07/16/2021  Post procedure Call Back phone  # [phone]  Permission to leave phone message Yes  Some recent data might be hidden     Patient questions:  Do you have a fever, pain , or abdominal swelling? No. Pain Score  0 *  Have you tolerated food without any problems? Yes.    Have you been able to return to your normal activities? Yes.    Do you have any questions about your discharge instructions: Diet   No. Medications  No. Follow up visit  No.  Do you have questions or concerns about your Care? Yes.    Patient called to return my message from this am. She states she is fine, but starting having some facial and shoulder itching yesterday. She states there is no rash, hives, or discoloration present, denies swelling of face, lips, tongue, throat or mouth or shortness of breath. Patient states she normally takes Allegra daily for seasonal allergies, but has not taken it today. Advised patient to go ahead and take her Allegra to see if this helps and f/u with her PCP if this continues or immediately if it gets worse after speaking with Dr. Carlean Purl for advisement. Patient states she will do so.  Actions: * If pain score is 4 or above: No action needed, pain <4. Have you developed a fever since your procedure? no  2.   Have you had an respiratory symptoms (SOB or cough) since your procedure? no  3.   Have you tested positive for COVID 19 since your procedure no  4.   Have you had any family members/close contacts diagnosed with the COVID 19 since your procedure?  no   If yes to any of these questions please route to Joylene John, RN and Joella Prince, RN

## 2021-07-18 NOTE — Telephone Encounter (Signed)
Attempted to reach patient for post-procedure f/u call. No answer. Left message that we will make another attempt to reach her later today and for her to please not hesitate to call us if she has any questions/concerns regarding her care. 

## 2021-07-18 NOTE — Telephone Encounter (Signed)
  Follow up Call-  Call back number 07/16/2021  Post procedure Call Back phone  # [phone]  Permission to leave phone message Yes  Some recent data might be hidden     Patient questions:  Do you have a fever, pain , or abdominal swelling? Yes.   Pain Score  0 *  Have you tolerated food without any problems? No.  Have you been able to return to your normal activities? No.  Do you have any questions about your discharge instructions: Diet   No. Medications  No. Follow up visit  No.  Do you have questions or concerns about your Care? Yes.  Pt. Feels her energy level has not fully returned, but she is tolerating food just fine, and is back at work and her normal activities.  Told pt. That all sounded fine, given 2 day prep, but to call if she should have any further questions or concerns.  Actions: * If pain score is 4 or above: No action needed, pain <4.

## 2021-07-21 ENCOUNTER — Encounter: Payer: Self-pay | Admitting: Registered Nurse

## 2021-07-21 ENCOUNTER — Ambulatory Visit (INDEPENDENT_AMBULATORY_CARE_PROVIDER_SITE_OTHER): Payer: Medicare HMO | Admitting: Registered Nurse

## 2021-07-21 ENCOUNTER — Telehealth: Payer: Self-pay | Admitting: Family Medicine

## 2021-07-21 ENCOUNTER — Other Ambulatory Visit: Payer: Self-pay

## 2021-07-21 VITALS — BP 154/88 | HR 80 | Temp 97.6°F | Resp 18 | Ht 69.0 in | Wt 217.6 lb

## 2021-07-21 DIAGNOSIS — B027 Disseminated zoster: Secondary | ICD-10-CM

## 2021-07-21 MED ORDER — TRIAMCINOLONE ACETONIDE 0.1 % EX CREA: 1.0000 "application " | TOPICAL_CREAM | Freq: Two times a day (BID) | CUTANEOUS | 0 refills | Status: DC

## 2021-07-21 MED ORDER — VALACYCLOVIR HCL 1 G PO TABS: 1000.0000 mg | ORAL_TABLET | Freq: Three times a day (TID) | ORAL | 0 refills | Status: DC

## 2021-07-21 NOTE — Telephone Encounter (Signed)
Health team nurse said patient needs to be seen today - Please call

## 2021-07-21 NOTE — Telephone Encounter (Cosign Needed)
Called and spoke with patient and patient stated that she might have shingles. Patient has an appt today at 1:50 with Maximiano Coss

## 2021-07-21 NOTE — Progress Notes (Signed)
Established Patient Office Visit  Subjective:  Patient ID: [name], female    DOB: [birth year]  Age: 65 y.o. MRN: [id number]  CC:  Chief Complaint  Patient presents with   Herpes Zoster    Patient states for the last f4-5 days she as been having a rash on her left leg and she thought it was razor burn but she now thinks its shingles and its spreading. She has been feeling weak, headaches, excessive sweating,and itching.    HPI [name] presents for rash  Rash onset last weekend, but constitutional symptoms for 1-2 weeks. Left leg Thought to be razor burn But now blistering, painful, and linear Does not cross midline Feeling weak, headaches, excessive sweating, itching. All this improved since z pack given by Dr. Colin Benton at [facility] in late August.  Past Medical History:  Diagnosis Date   Allergic rhinitis    Allergy    Anxiety    Arthritis    R knee, Left Knee   Asthma    Bipolar disorder ([place])    Cataract    Complication of anesthesia    had bad anxiety after anesthesia   Constipation, chronic    Depression    Diabetes mellitus without complication (HCC)    Dysrhythmia    Palpations occ, takes Tenormin   GERD (gastroesophageal reflux disease)    Glaucoma    Heart murmur    Hx of adenomatous colonic polyps 2008   Hyperlipidemia    Hypertension    MVP (mitral valve prolapse)    Sleep apnea    wears CPAP   Urinary tract infection    frequent, see Urololgist 12/16/2010    Past Surgical History:  Procedure Laterality Date   ABDOMINAL HYSTERECTOMY  2002   no oophorectomy   BACK SURGERY  2001   L5 - S1   CATARACT EXTRACTION     COLONOSCOPY     NASAL SINUS SURGERY  01/2010   x2   TONSILLECTOMY     TOTAL KNEE ARTHROPLASTY  12/23/2011   Procedure: TOTAL KNEE ARTHROPLASTY;  Surgeon: Ninetta Lights, MD;  Location: [facility];  Service: Orthopedics;  Laterality: Right;  DR MURPHY WANTS 120 MINUTES FOR SURGERY   TOTAL KNEE ARTHROPLASTY  08/31/2012   Procedure:  TOTAL KNEE ARTHROPLASTY;  Surgeon: Ninetta Lights, MD;  Location: [place];  Service: Orthopedics;  Laterality: Left;    Family History  Problem Relation Age of Onset   Lung disease Mother        Mycobacterium avium intracellulare   Coronary artery disease Father    Heart attack Father 26   Heart disease Father    Hypertension Brother    Hyperlipidemia Brother    Alcohol abuse Brother    Anxiety disorder Brother    Healthy Brother    Diabetes Maternal Grandmother    Stroke Maternal Grandmother    Stroke Maternal Grandfather    Heart attack Paternal Grandmother    Anesthesia problems Neg Hx    Colon cancer Neg Hx    Esophageal cancer Neg Hx    Rectal cancer Neg Hx    Stomach cancer Neg Hx     Social History   Socioeconomic History   Marital status: Single    Spouse name: Not on file   Number of children: 0   Years of education: 13   Highest education level: Some college, no degree  Occupational History   Occupation: retired//disability    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 No kids. Grew up in [place]. Had a great childhood. Has 2 older brothers.   Mom was a stay at home mom. Her dad worked at [employer] as a Banker. He died when pt was 65yo.    Never abused.       Disabled d/t Bipolar since 1992.   Religion-Christian   No legal issues.    Lives alone. Has 1 dog. Has pet sitting business.   2 cups of coffee a day.   Social Determinants of Health   Financial Resource Strain: Low Risk    Difficulty of Paying Living Expenses: Not very hard  Food Insecurity:  No Food Insecurity   Worried About Charity fundraiser in the Last Year: Never true   Ran Out of Food in the Last Year: Never true  Transportation Needs: No Transportation Needs   Lack of Transportation (Medical): No   Lack of Transportation (Non-Medical): No  Physical Activity: Sufficiently Active   Days of Exercise per Week: 7 days   Minutes of Exercise per Session: 30 min  Stress: Stress Concern Present   Feeling of Stress : Rather much  Social Connections: Socially Isolated   Frequency of Communication with Friends and Family: Twice a week   Frequency of Social Gatherings with Friends and Family: Never   Attends Religious Services: 1 to 4 times per year   Active Member of [employer] or Organizations: No   Attends Archivist Meetings: Never   Marital Status: Divorced  Human resources officer Violence: Not At Risk   Fear of Current or Ex-Partner: No   Emotionally Abused: No   Physically Abused: No   Sexually Abused: No    Outpatient Medications Prior to Visit  Medication Sig Dispense Refill   Alirocumab (PRALUENT) 150 MG/ML SOAJ Inject 150 mg into the skin every 14 (fourteen) days. 6 mL 3   ALPHAGAN P 0.1 % SOLN Place 1 drop into both eyes every 8 (eight) hours.      azelastine (ASTELIN) 0.1 % nasal spray PLACE 2 SPRAYS INTO EACH NOSTRIL TWO TIMES A DAY AS DIRECTED 30 mL 2   budesonide-formoterol (SYMBICORT) 160-4.5 MCG/ACT inhaler Inhale 2 puffs into the lungs in the morning and at bedtime. 10.2 each 6   Cholecalciferol (VITAMIN D3) 50 MCG (2000 UT) CAPS Take 2,000 Units by mouth daily.     CLARAVIS 20 MG capsule Take 20 mg by mouth daily.     clonazePAM (KLONOPIN) 2 MG tablet 1 mg 2 (two) times daily.   0   Cyanocobalamin (VITAMIN B-12 PO) Take by mouth.     fenofibrate (TRICOR) 145 MG tablet Take 1 tablet (145 mg total) by mouth daily. 90 tablet 1   fexofenadine (ALLEGRA) 180 MG tablet Take 180 mg by mouth daily.     fluticasone (FLONASE) 50 MCG/ACT nasal spray SPRAY TWO  SPRAYS IN EACH NOSTRIL ONCE DAILY 16 g 3   gabapentin (NEURONTIN) 600 MG tablet Take 1,200 mg by mouth 2 (two) times daily.  ISOtretinoin (CLARAVIS PO) Take by mouth daily.     lamoTRIgine (LAMICTAL) 200 MG tablet Take 200 mg by mouth 2 (two) times daily.     LUMIGAN 0.01 % SOLN Place 1 drop into both eyes at bedtime.      meloxicam (MOBIC) 15 MG tablet Take 1 tablet (15 mg total) by mouth daily. 30 tablet 3   metoprolol succinate (TOPROL-XL) 50 MG 24 hr tablet TAKE ONE TABLET BY MOUTH DAILY WITH A MEAL 90 tablet 2   montelukast (SINGULAIR) 10 MG tablet Take 1 tablet (10 mg total) by mouth at bedtime. 90 tablet 1   Multiple Vitamin (MULTIVITAMIN WITH MINERALS) TABS tablet Take 1 tablet by mouth daily.  (Patient not taking: Reported on 07/16/2021)     OLANZapine (ZYPREXA) 5 MG tablet Take 5 mg by mouth daily as needed.     omega-3 acid ethyl esters (LOVAZA) 1 g capsule Take 2 capsules (2 g total) by mouth 2 (two) times daily. 360 capsule 0   OVER THE COUNTER MEDICATION Senna tablets- 8.6 mg daily (Patient not taking: Reported on 07/16/2021)     Pyridoxine HCl (VITAMIN B-6 PO) Take by mouth.     QUEtiapine (SEROQUEL XR) 300 MG 24 hr tablet Take 2 tablets (600 mg total) by mouth at bedtime. (Patient taking differently: Take 600 mg by mouth at bedtime. Taking 1 400 mg tablet at bedtime, takes 50 mg daily as needed) 60 tablet 1   UNABLE TO FIND CPAP     venlafaxine (EFFEXOR-XR) 75 MG 24 hr capsule Take 75 mg by mouth daily. 2 po q am, 1 po q afternoon     No facility-administered medications prior to visit.    Allergies  Allergen Reactions   Nitrofurantoin Monohyd Macro Nausea Only    Severe headache   Statins Other (See Comments)    Severe weakness, pain   Sulfa Antibiotics Nausea And Vomiting   Alprazolam Anxiety and Other (See Comments)    Hyper    Amoxicillin Rash   Prednisone Anxiety    ROS Review of Systems  Constitutional: Negative.   HENT: Negative.    Eyes: Negative.    Respiratory: Negative.    Cardiovascular: Negative.   Gastrointestinal: Negative.   Genitourinary: Negative.   Musculoskeletal: Negative.   Skin:  Positive for rash.  Neurological: Negative.   Psychiatric/Behavioral: Negative.       Objective:    Physical Exam Vitals and nursing note reviewed.  Constitutional:      General: She is not in acute distress.    Appearance: Normal appearance. She is normal weight. She is not ill-appearing, toxic-appearing or diaphoretic.  Cardiovascular:     Rate and Rhythm: Normal rate and regular rhythm.     Heart sounds: Normal heart sounds. No murmur heard.   No friction rub. No gallop.  Pulmonary:     Effort: Pulmonary effort is normal. No respiratory distress.     Breath sounds: Normal breath sounds. No stridor. No wheezing, rhonchi or rales.  Chest:     Chest wall: No tenderness.  Skin:    General: Skin is warm and dry.     Findings: Rash (linear vesicular rash on anterior lower left leg. does have some vesicles on right leg, right arm, but isolated and no clear pattern.) present.  Neurological:     General: No focal deficit present.     Mental Status: She is alert and oriented to person, place, and time. Mental status is at baseline.  Psychiatric:  Mood and Affect: Mood normal.        Behavior: Behavior normal.        Thought Content: Thought content normal.        Judgment: Judgment normal.    BP (!) 154/88   Pulse 80   Temp 97.6 F (36.4 C) (Temporal)   Resp 18   Ht '5\' 9"'$  (1.753 m)   Wt 217 lb 9.6 oz (98.7 kg)   LMP 11/09/2000   SpO2 95%   BMI 32.13 kg/m  Wt Readings from Last 3 Encounters:  07/21/21 217 lb 9.6 oz (98.7 kg)  07/16/21 216 lb 12.8 oz (98.3 kg)  07/02/21 216 lb 12.8 oz (98.3 kg)     There are no preventive care reminders to display for this patient.  There are no preventive care reminders to display for this patient.  Lab Results  Component Value Date   TSH 1.29 04/09/2021   Lab Results   Component Value Date   WBC 4.4 04/09/2021   HGB 12.6 04/09/2021   HCT 36.9 04/09/2021   MCV 88.4 04/09/2021   PLT 220.0 04/09/2021   Lab Results  Component Value Date   NA 142 04/09/2021   K 4.0 04/09/2021   CO2 21 04/09/2021   GLUCOSE 115 (H) 04/09/2021   BUN 16 04/09/2021   CREATININE 0.67 04/09/2021   BILITOT 0.6 04/09/2021   ALKPHOS 29 (L) 04/09/2021   AST 18 04/09/2021   ALT 20 04/09/2021   PROT 7.2 04/09/2021   ALBUMIN 5.0 04/09/2021   CALCIUM 10.0 04/09/2021   GFR 92.86 04/09/2021   Lab Results  Component Value Date   CHOL 148 04/09/2021   Lab Results  Component Value Date   HDL 66.30 04/09/2021   Lab Results  Component Value Date   LDLCALC 44 04/09/2021   Lab Results  Component Value Date   TRIG 189.0 (H) 04/09/2021   Lab Results  Component Value Date   CHOLHDL 2 04/09/2021   Lab Results  Component Value Date   HGBA1C 7.4 (H) 04/09/2021      Assessment & Plan:   Problem List Items Addressed This Visit   None Visit Diagnoses     Disseminated herpes zoster    -  Primary   Relevant Medications   valACYclovir (VALTREX) 1000 MG tablet   triamcinolone cream (KENALOG) 0.1 %       Meds ordered this encounter  Medications   valACYclovir (VALTREX) 1000 MG tablet    Sig: Take 1 tablet (1,000 mg total) by mouth 3 (three) times daily.    Dispense:  21 tablet    Refill:  0    Order Specific Question:   Supervising Provider    Answer:   Carlota Raspberry, JEFFREY R [[id number]]   triamcinolone cream (KENALOG) 0.1 %    Sig: Apply 1 application topically 2 (two) times daily.    Dispense:  30 g    Refill:  0    Order Specific Question:   Supervising Provider    Answer:   Carlota Raspberry, JEFFREY R [[id number]]    Follow-up: Return if symptoms worsen or fail to improve.   PLAN Concern for disseminated zoster. Likely elicited by recent infection that rolled into prodromal phase. Will treat with antivirals and topical steroids as above For pain can increase gabapentin to  1200-600-'1200mg'$  po qd. Her GFR is greater than 90, ok for '3000mg'$  daily dosing in 3 divided doses. Reviewed ER precautions with patient who voices understanding Patient encouraged to call clinic  with any questions, comments, or concerns.  Maximiano Coss, NP

## 2021-07-21 NOTE — Patient Instructions (Addendum)
Ms. Redmond Baseman -   Looking like disseminated zoster - shingles that spreads to more places on the body than typical.   Will treat with Valacyclovir '1000mg'$  taken three times daily for a week.  Can use topical triamcinolone twice daily as needed on rash.  Inrease gabapentin - use your normal dose but add an additional tablet ('600mg'$ ) at lunch time.  I'll check in to see how you're doing in a few days.  Thanks  Rich     If you have lab work done today you will be contacted with your lab results within the next 2 weeks.  If you have not heard from [place] then please contact us. The fastest way to get your results is to register for My Chart.   IF you received an x-ray today, you will receive an invoice from [facility]. Please contact [facility] at [phone] with questions or concerns regarding your invoice.   IF you received labwork today, you will receive an invoice from [facility]. Please contact [facility] at [phone] with questions or concerns regarding your invoice.   Our billing staff will not be able to assist you with questions regarding bills from these companies.  You will be contacted with the lab results as soon as they are available. The fastest way to get your results is to activate your My Chart account. Instructions are located on the last page of this paperwork. If you have not heard from [place] regarding the results in 2 weeks, please contact this office.

## 2021-07-22 ENCOUNTER — Ambulatory Visit: Payer: Medicare HMO | Admitting: Family Medicine

## 2021-07-22 NOTE — Progress Notes (Deleted)
Cardiology Office Note   Date:  07/22/2021   ID:  [name], DOB [birth year], MRN [id number]  PCP:  Midge Minium, MD    No chief complaint on file.  hyperlipidemia  Wt Readings from Last 3 Encounters:  07/21/21 217 lb 9.6 oz (98.7 kg)  07/16/21 216 lb 12.8 oz (98.3 kg)  07/02/21 216 lb 12.8 oz (98.3 kg)       History of Present Illness: [name] is a 65 y.o. female  who has had a h/o MVP for years.  She was treated with atenolol for many years.     She had some higher blood pressures and palpitations.  Her atenolol was switched to metoprolol and HCTZ was started for BP control.    She has had hyperlipidemia treated with a PCSK9 inhibitor, initially Repatha but changed to Praluent due to insurance reasons. Father with an MI at an early age.    Noted DOE in the past.  Not a lot of exercise on a regular basis.     Past Medical History:  Diagnosis Date   Allergic rhinitis    Allergy    Anxiety    Arthritis    R knee, Left Knee   Asthma    Bipolar disorder ([place])    Cataract    Complication of anesthesia    had bad anxiety after anesthesia   Constipation, chronic    Depression    Diabetes mellitus without complication (HCC)    Dysrhythmia    Palpations occ, takes Tenormin   GERD (gastroesophageal reflux disease)    Glaucoma    Heart murmur    Hx of adenomatous colonic polyps 2008   Hyperlipidemia    Hypertension    MVP (mitral valve prolapse)    Sleep apnea    wears CPAP   Urinary tract infection    frequent, see Urololgist 12/16/2010    Past Surgical History:  Procedure Laterality Date   ABDOMINAL HYSTERECTOMY  2002   no oophorectomy   BACK SURGERY  2001   L5 - S1   CATARACT EXTRACTION     COLONOSCOPY     NASAL SINUS SURGERY  01/2010   x2   TONSILLECTOMY     TOTAL KNEE ARTHROPLASTY  12/23/2011   Procedure: TOTAL KNEE ARTHROPLASTY;  Surgeon: Ninetta Lights, MD;  Location: [place];  Service: Orthopedics;  Laterality: Right;  DR MURPHY  WANTS 120 MINUTES FOR SURGERY   TOTAL KNEE ARTHROPLASTY  08/31/2012   Procedure: TOTAL KNEE ARTHROPLASTY;  Surgeon: Ninetta Lights, MD;  Location: [place];  Service: Orthopedics;  Laterality: Left;     Current Outpatient Medications  Medication Sig Dispense Refill   Alirocumab (PRALUENT) 150 MG/ML SOAJ Inject 150 mg into the skin every 14 (fourteen) days. 6 mL 3   ALPHAGAN P 0.1 % SOLN Place 1 drop into both eyes every 8 (eight) hours.      azelastine (ASTELIN) 0.1 % nasal spray PLACE 2 SPRAYS INTO EACH NOSTRIL TWO TIMES A DAY AS DIRECTED 30 mL 2   budesonide-formoterol (SYMBICORT) 160-4.5 MCG/ACT inhaler Inhale 2 puffs into the lungs in the morning and at bedtime. 10.2 each 6   Cholecalciferol (VITAMIN D3) 50 MCG (2000 UT) CAPS Take 2,000 Units by mouth daily.     CLARAVIS 20 MG capsule Take 20 mg by mouth daily.     clonazePAM (KLONOPIN) 2 MG tablet 1 mg 2 (two) times daily.   0   Cyanocobalamin (VITAMIN B-12  PO) Take by mouth.     fenofibrate (TRICOR) 145 MG tablet Take 1 tablet (145 mg total) by mouth daily. 90 tablet 1   fexofenadine (ALLEGRA) 180 MG tablet Take 180 mg by mouth daily.     fluticasone (FLONASE) 50 MCG/ACT nasal spray SPRAY TWO SPRAYS IN EACH NOSTRIL ONCE DAILY 16 g 3   gabapentin (NEURONTIN) 600 MG tablet Take 1,200 mg by mouth 2 (two) times daily.      ISOtretinoin (CLARAVIS PO) Take by mouth daily.     lamoTRIgine (LAMICTAL) 200 MG tablet Take 200 mg by mouth 2 (two) times daily.     LUMIGAN 0.01 % SOLN Place 1 drop into both eyes at bedtime.      meloxicam (MOBIC) 15 MG tablet Take 1 tablet (15 mg total) by mouth daily. 30 tablet 3   metoprolol succinate (TOPROL-XL) 50 MG 24 hr tablet TAKE ONE TABLET BY MOUTH DAILY WITH A MEAL 90 tablet 2   montelukast (SINGULAIR) 10 MG tablet Take 1 tablet (10 mg total) by mouth at bedtime. 90 tablet 1   Multiple Vitamin (MULTIVITAMIN WITH MINERALS) TABS tablet Take 1 tablet by mouth daily.  (Patient not taking: Reported on 07/16/2021)      OLANZapine (ZYPREXA) 5 MG tablet Take 5 mg by mouth daily as needed.     omega-3 acid ethyl esters (LOVAZA) 1 g capsule Take 2 capsules (2 g total) by mouth 2 (two) times daily. 360 capsule 0   OVER THE COUNTER MEDICATION Senna tablets- 8.6 mg daily (Patient not taking: Reported on 07/16/2021)     Pyridoxine HCl (VITAMIN B-6 PO) Take by mouth.     QUEtiapine (SEROQUEL XR) 300 MG 24 hr tablet Take 2 tablets (600 mg total) by mouth at bedtime. (Patient taking differently: Take 600 mg by mouth at bedtime. Taking 1 400 mg tablet at bedtime, takes 50 mg daily as needed) 60 tablet 1   triamcinolone cream (KENALOG) 0.1 % Apply 1 application topically 2 (two) times daily. 30 g 0   UNABLE TO FIND CPAP     valACYclovir (VALTREX) 1000 MG tablet Take 1 tablet (1,000 mg total) by mouth 3 (three) times daily. 21 tablet 0   venlafaxine (EFFEXOR-XR) 75 MG 24 hr capsule Take 75 mg by mouth daily. 2 po q am, 1 po q afternoon     No current facility-administered medications for this visit.    Allergies:   Nitrofurantoin monohyd macro, Statins, Sulfa antibiotics, Alprazolam, Amoxicillin, and Prednisone    Social History:  The patient  reports that she has never smoked. She has never used smokeless tobacco. She reports that she does not drink alcohol and does not use drugs.   Family History:  The patient's ***family history includes Alcohol abuse in her brother; Anxiety disorder in her brother; Coronary artery disease in her father; Diabetes in her maternal grandmother; Healthy in her brother; Heart attack in her paternal grandmother; Heart attack (age of onset: 83) in her father; Heart disease in her father; Hyperlipidemia in her brother; Hypertension in her brother; Lung disease in her mother; Stroke in her maternal grandfather and maternal grandmother.    ROS:  Please see the history of present illness.   Otherwise, review of systems are positive for ***.   All other systems are reviewed and negative.     PHYSICAL EXAM: VS:  LMP 11/09/2000  , BMI There is no height or weight on file to calculate BMI. GEN: Well nourished, well developed, in no acute  distress HEENT: normal Neck: no JVD, carotid bruits, or masses Cardiac: ***RRR; no murmurs, rubs, or gallops,no edema  Respiratory:  clear to auscultation bilaterally, normal work of breathing GI: soft, nontender, nondistended, + BS MS: no deformity or atrophy Skin: warm and dry, no rash Neuro:  Strength and sensation are intact Psych: euthymic mood, full affect   EKG:   The ekg ordered today demonstrates ***   Recent Labs: 04/09/2021: ALT 20; BUN 16; Creatinine, Ser 0.67; Hemoglobin 12.6; Platelets 220.0; Potassium 4.0; Sodium 142; TSH 1.29   Lipid Panel    Component Value Date/Time   CHOL 148 04/09/2021 1506   CHOL 163 11/14/2018 1447   CHOL 312 (H) 06/19/2015 0924   TRIG 189.0 (H) 04/09/2021 1506   TRIG 657 (H) 06/19/2015 0924   HDL 66.30 04/09/2021 1506   HDL 71 11/14/2018 1447   HDL 44 06/19/2015 0924   CHOLHDL 2 04/09/2021 1506   VLDL 37.8 04/09/2021 1506   LDLCALC 44 04/09/2021 1506   LDLCALC 66 11/14/2018 1447   LDLCALC Comment 06/19/2015 0924   LDLDIRECT 123.0 01/26/2019 1030     Other studies Reviewed: Additional studies/ records that were reviewed today with results demonstrating: ***.   ASSESSMENT AND PLAN:  Hyperlipidemia: Praluent treatment.  Whole food, plant-based diet recommended. MVP: HTN: Avoid processed foods. Family h/o early CAD:   Current medicines are reviewed at length with the patient today.  The patient concerns regarding her medicines were addressed.  The following changes have been made:  No change***  Labs/ tests ordered today include: *** No orders of the defined types were placed in this encounter.   Recommend 150 minutes/week of aerobic exercise Low fat, low carb, high fiber diet recommended  Disposition:   FU in ***   Signed, Larae Grooms, MD  07/22/2021 8:45 AM     [facility] [place], [place], [place]  [address] Phone: [phone]; Fax: [phone]

## 2021-07-23 ENCOUNTER — Encounter: Payer: Self-pay | Admitting: Internal Medicine

## 2021-07-23 ENCOUNTER — Ambulatory Visit: Payer: Medicare HMO | Admitting: Interventional Cardiology

## 2021-07-24 ENCOUNTER — Other Ambulatory Visit: Payer: Self-pay | Admitting: Registered Nurse

## 2021-07-24 ENCOUNTER — Telehealth: Payer: Self-pay

## 2021-07-24 DIAGNOSIS — B027 Disseminated zoster: Secondary | ICD-10-CM

## 2021-07-24 NOTE — Telephone Encounter (Signed)
She is on Valtrex which is the appropriate treatment.  Unfortunately these can continue to spread even after treatment is started.  It can take days to weeks for symptoms to improve so after 3 days I am not surprised that things haven't improved.  I would continue things as they are and if she's worried about dramatic or extensive spreading we can always re-evaluate

## 2021-07-24 NOTE — Telephone Encounter (Signed)
Spoke with patient and patient stated that she will continue per you what you advised by continuing taking the valtrex. Patient understood. No further concerns at this time.

## 2021-07-24 NOTE — Telephone Encounter (Signed)
Patient shingles are not getting any better. She saw Richard on 07/21/21. Is there anything we can do for her? Please advise

## 2021-07-24 NOTE — Telephone Encounter (Signed)
Patient is calling in stating that she saw Richard for shingles 9/12 and was advised to contact us if she isnt better. [place] states it is spreading to her private area and down her inner thigh. Wondering what options there are to help.

## 2021-07-27 ENCOUNTER — Other Ambulatory Visit: Payer: Self-pay

## 2021-07-27 ENCOUNTER — Emergency Department (HOSPITAL_BASED_OUTPATIENT_CLINIC_OR_DEPARTMENT_OTHER)
Admission: EM | Admit: 2021-07-27 | Discharge: 2021-07-27 | Disposition: A | Discharge status: Home / Self Care | Payer: Medicare HMO | Attending: Emergency Medicine | Admitting: Emergency Medicine

## 2021-07-27 ENCOUNTER — Emergency Department (HOSPITAL_BASED_OUTPATIENT_CLINIC_OR_DEPARTMENT_OTHER): Payer: Medicare HMO

## 2021-07-27 ENCOUNTER — Encounter (HOSPITAL_BASED_OUTPATIENT_CLINIC_OR_DEPARTMENT_OTHER): Payer: Self-pay | Admitting: Emergency Medicine

## 2021-07-27 DIAGNOSIS — I10 Essential (primary) hypertension: Secondary | ICD-10-CM | POA: Insufficient documentation

## 2021-07-27 DIAGNOSIS — S99912A Unspecified injury of left ankle, initial encounter: Secondary | ICD-10-CM | POA: Diagnosis not present

## 2021-07-27 DIAGNOSIS — E119 Type 2 diabetes mellitus without complications: Secondary | ICD-10-CM | POA: Insufficient documentation

## 2021-07-27 DIAGNOSIS — Z96653 Presence of artificial knee joint, bilateral: Secondary | ICD-10-CM | POA: Diagnosis not present

## 2021-07-27 DIAGNOSIS — Z79899 Other long term (current) drug therapy: Secondary | ICD-10-CM | POA: Insufficient documentation

## 2021-07-27 DIAGNOSIS — J45909 Unspecified asthma, uncomplicated: Secondary | ICD-10-CM | POA: Insufficient documentation

## 2021-07-27 DIAGNOSIS — S82892A Other fracture of left lower leg, initial encounter for closed fracture: Secondary | ICD-10-CM

## 2021-07-27 DIAGNOSIS — W19XXXA Unspecified fall, initial encounter: Secondary | ICD-10-CM

## 2021-07-27 DIAGNOSIS — Y93K1 Activity, walking an animal: Secondary | ICD-10-CM | POA: Insufficient documentation

## 2021-07-27 DIAGNOSIS — W01198A Fall on same level from slipping, tripping and stumbling with subsequent striking against other object, initial encounter: Secondary | ICD-10-CM | POA: Insufficient documentation

## 2021-07-27 DIAGNOSIS — S8262XA Displaced fracture of lateral malleolus of left fibula, initial encounter for closed fracture: Secondary | ICD-10-CM | POA: Diagnosis not present

## 2021-07-27 DIAGNOSIS — S8292XA Unspecified fracture of left lower leg, initial encounter for closed fracture: Secondary | ICD-10-CM | POA: Diagnosis not present

## 2021-07-27 DIAGNOSIS — Z7951 Long term (current) use of inhaled steroids: Secondary | ICD-10-CM | POA: Diagnosis not present

## 2021-07-27 DIAGNOSIS — M7989 Other specified soft tissue disorders: Secondary | ICD-10-CM | POA: Diagnosis not present

## 2021-07-27 DIAGNOSIS — S99812A Other specified injuries of left ankle, initial encounter: Secondary | ICD-10-CM | POA: Diagnosis not present

## 2021-07-27 DIAGNOSIS — S92152A Displaced avulsion fracture (chip fracture) of left talus, initial encounter for closed fracture: Secondary | ICD-10-CM | POA: Diagnosis not present

## 2021-07-27 NOTE — ED Triage Notes (Signed)
Pt fell while walking dog , fell on her right side, hit her head lightly on ground, and twisted left ankle,left ankle obvious deformity.

## 2021-07-27 NOTE — Discharge Instructions (Addendum)
Call your foot and ankle specialist as discussed in the next 2-3 days.    Return immediately back to the ER if:  Your symptoms worsen within the next 12-24 hours. You develop new symptoms such as new fevers, persistent vomiting, new pain, shortness of breath, or new weakness or numbness, or if you have any other concerns.

## 2021-07-27 NOTE — ED Notes (Addendum)
Patient refused crutches but changed her mind

## 2021-07-27 NOTE — ED Provider Notes (Signed)
[facility] EMERGENCY DEPT Provider Note   CSN: [id number] Arrival date & time: 07/27/21  1605     History Chief Complaint  Patient presents with   Fall    [name] is a 65 y.o. female.  Patient presents chief complaint of left ankle pain.  She said she was out walking her dog when she was turning and lost her balance and fell onto her right shoulder, she twisted her left ankle at this time.  Triage notation states that she struck her head, however the patient states she never struck her head just to her right shoulder.  She denies headache or neck pain.  Denies knee pain or hip pain or back pain.  No recent illnesses no fever no cough no vomiting no diarrhea.      Past Medical History:  Diagnosis Date   Allergic rhinitis    Allergy    Anxiety    Arthritis    R knee, Left Knee   Asthma    Bipolar disorder ([place])    Cataract    Complication of anesthesia    had bad anxiety after anesthesia   Constipation, chronic    Depression    Diabetes mellitus without complication ([place])    Dysrhythmia    Palpations occ, takes Tenormin   GERD (gastroesophageal reflux disease)    Glaucoma    Heart murmur    Hx of adenomatous colonic polyps 2008   Hyperlipidemia    Hypertension    MVP (mitral valve prolapse)    Sleep apnea    wears CPAP   Urinary tract infection    frequent, see Urololgist 12/16/2010    Patient Active Problem List   Diagnosis Date Noted   Hx of adenomatous colonic polyps 07/2021   Sleep related hypoxia 12/17/2020   Cervical myelopathy ([place]) 10/16/2020   Elevated blood-pressure reading, without diagnosis of hypertension 10/16/2020   Lumbar radiculopathy 10/16/2020   Positive ANA (antinuclear antibody) 08/14/2020   Chronically dry eyes, bilateral 08/14/2020   Dry mouth 08/14/2020   Allergic rhinitis due to pollen 12/13/2018   Diet-controlled diabetes mellitus ([place]) 02/09/2018   Statin intolerance 11/04/2017   Family history of early CAD  03/10/2017   HTN (hypertension) 02/15/2017   Herniated nucleus pulposus, L5-S1 06/22/2014   Asthma, moderate persistent 10/09/2013   Obesity (BMI 30-39.9) 10/09/2013   Fatigue 12/12/2012   GERD (gastroesophageal reflux disease) 09/02/2012   Vitamin D deficiency 09/15/2010   DEPRESSIVE DISORDER 07/17/2010   RHINITIS 06/14/2009   MUSCLE WEAKNESS (GENERALIZED) 01/03/2009   Hyperlipidemia 10/10/2008   BIPOLAR AFFECTIVE DISORDER 10/10/2008   ADD 10/10/2008    Past Surgical History:  Procedure Laterality Date   ABDOMINAL HYSTERECTOMY  2002   no oophorectomy   BACK SURGERY  2001   L5 - S1   CATARACT EXTRACTION     COLONOSCOPY     NASAL SINUS SURGERY  01/2010   x2   TONSILLECTOMY     TOTAL KNEE ARTHROPLASTY  12/23/2011   Procedure: TOTAL KNEE ARTHROPLASTY;  Surgeon: Ninetta Lights, MD;  Location: [facility];  Service: Orthopedics;  Laterality: Right;  DR MURPHY WANTS 120 MINUTES FOR SURGERY   TOTAL KNEE ARTHROPLASTY  08/31/2012   Procedure: TOTAL KNEE ARTHROPLASTY;  Surgeon: Ninetta Lights, MD;  Location: [facility];  Service: Orthopedics;  Laterality: Left;     OB History     Gravida  0   Para  0   Term  0   Preterm  0  AB  0   Living  0      SAB  0   IAB  0   Ectopic  0   Multiple  0   Live Births  0           Family History  Problem Relation Age of Onset   Lung disease Mother        Mycobacterium avium intracellulare   Coronary artery disease Father    Heart attack Father 60   Heart disease Father    Hypertension Brother    Hyperlipidemia Brother    Alcohol abuse Brother    Anxiety disorder Brother    Healthy Brother    Diabetes Maternal Grandmother    Stroke Maternal Grandmother    Stroke Maternal Grandfather    Heart attack Paternal Grandmother    Anesthesia problems Neg Hx    Colon cancer Neg Hx    Esophageal cancer Neg Hx    Rectal cancer Neg Hx    Stomach cancer Neg Hx     Social History   Tobacco Use   Smoking status: Never    Smokeless tobacco: Never  Vaping Use   Vaping Use: Never used  Substance Use Topics   Alcohol use: No    Alcohol/week: 0.0 standard drinks    Comment: once a year maybe   Drug use: No    Home Medications Prior to Admission medications   Medication Sig Start Date End Date Taking? Authorizing Provider  Alirocumab (PRALUENT) 150 MG/ML SOAJ Inject 150 mg into the skin every 14 (fourteen) days. 12/25/20   Jettie Booze, MD  ALPHAGAN P 0.1 % SOLN Place 1 drop into both eyes every 8 (eight) hours.  05/03/20   [provider]  azelastine (ASTELIN) 0.1 % nasal spray PLACE 2 SPRAYS INTO EACH NOSTRIL TWO TIMES A DAY AS DIRECTED 05/20/20   Midge Minium, MD  budesonide-formoterol [facility]) 160-4.5 MCG/ACT inhaler Inhale 2 puffs into the lungs in the morning and at bedtime. 03/18/21   Brand Males, MD  Cholecalciferol (VITAMIN D3) 50 MCG (2000 UT) CAPS Take 2,000 Units by mouth daily.    [provider]  CLARAVIS 20 MG capsule Take 20 mg by mouth daily. 07/11/21   [provider]  clonazePAM (KLONOPIN) 2 MG tablet 1 mg 2 (two) times daily.  10/23/15   [provider]  Cyanocobalamin (VITAMIN B-12 PO) Take by mouth.    [provider]  fenofibrate (TRICOR) 145 MG tablet Take 1 tablet (145 mg total) by mouth daily. 07/09/21   Midge Minium, MD  fexofenadine (ALLEGRA) 180 MG tablet Take 180 mg by mouth daily.    [provider]  fluticasone (FLONASE) 50 MCG/ACT nasal spray SPRAY TWO SPRAYS IN EACH NOSTRIL ONCE DAILY 07/16/21   Midge Minium, MD  gabapentin (NEURONTIN) 600 MG tablet Take 1,200 mg by mouth 2 (two) times daily.     [provider]  ISOtretinoin (CLARAVIS PO) Take by mouth daily.    [provider]  lamoTRIgine (LAMICTAL) 200 MG tablet Take 200 mg by mouth 2 (two) times daily.    [provider]  LUMIGAN 0.01 % SOLN Place 1 drop into both eyes at bedtime.  10/19/19   [provider]  meloxicam (MOBIC) 15 MG tablet Take 1 tablet (15 mg total) by mouth daily. 03/18/21   Midge Minium, MD  metoprolol succinate (TOPROL-XL) 50 MG 24 hr tablet TAKE ONE TABLET BY MOUTH DAILY WITH A  MEAL 11/19/20   Midge Minium, MD  montelukast (SINGULAIR) 10 MG tablet Take 1 tablet (10 mg total) by mouth at bedtime. 07/12/20   Midge Minium, MD  Multiple Vitamin (MULTIVITAMIN WITH MINERALS) TABS tablet Take 1 tablet by mouth daily.  Patient not taking: Reported on 07/16/2021    [provider]  OLANZapine (ZYPREXA) 5 MG tablet Take 5 mg by mouth daily as needed.    [provider]  omega-3 acid ethyl esters (LOVAZA) 1 g capsule Take 2 capsules (2 g total) by mouth 2 (two) times daily. 07/03/21   Jettie Booze, MD  OVER THE COUNTER MEDICATION Senna tablets- 8.6 mg daily Patient not taking: Reported on 07/16/2021    [provider]  Pyridoxine HCl (VITAMIN B-6 PO) Take by mouth.    [provider]  QUEtiapine (SEROQUEL XR) 300 MG 24 hr tablet Take 2 tablets (600 mg total) by mouth at bedtime. Patient taking differently: Take 600 mg by mouth at bedtime. Taking 1 400 mg tablet at bedtime, takes 50 mg daily as needed 04/09/21   Donnal Moat T, PA-C  triamcinolone cream (KENALOG) 0.1 % Apply 1 application topically 2 (two) times daily. 07/21/21   Maximiano Coss, NP  UNABLE TO FIND CPAP    [provider]  valACYclovir (VALTREX) 1000 MG tablet TAKE ONE TABLET BY MOUTH THREE TIMES A DAY 07/24/21   Maximiano Coss, NP  venlafaxine (EFFEXOR-XR) 75 MG 24 hr capsule Take 75 mg by mouth daily. 2 po q am, 1 po q afternoon    [provider]    Allergies    Nitrofurantoin monohyd macro, Statins, Sulfa antibiotics, Alprazolam, Amoxicillin, and Prednisone  Review of Systems   Review of Systems  Constitutional:  Negative for fever.  HENT:  Negative for ear pain.   Eyes:  Negative for pain.  Respiratory:  Negative for cough.    Cardiovascular:  Negative for chest pain.  Gastrointestinal:  Negative for abdominal pain.  Genitourinary:  Negative for flank pain.  Musculoskeletal:  Negative for back pain.  Skin:  Negative for rash.  Neurological:  Negative for headaches.   Physical Exam Updated Vital Signs BP (!) 146/115   Pulse 81   Temp 98.6 F (37 C) (Oral)   Resp 18   LMP 11/09/2000   SpO2 96%   Physical Exam Constitutional:      General: She is not in acute distress.    Appearance: Normal appearance.  HENT:     Head: Normocephalic and atraumatic.     Nose: Nose normal.  Eyes:     Extraocular Movements: Extraocular movements intact.  Cardiovascular:     Rate and Rhythm: Normal rate.  Pulmonary:     Effort: Pulmonary effort is normal.  Musculoskeletal:     Cervical back: Normal range of motion.     Comments: Swelling and tenderness to left ankle left lateral malleoli region.  No open lesion noted.  Neurovascularly intact otherwise compartments are soft.  No C or T or L-spine midline step-offs or tenderness noted.  Neurological:     General: No focal deficit present.     Mental Status: She is alert. Mental status is at baseline.    ED Results / Procedures / Treatments   Labs (all labs ordered are listed, but only abnormal results are displayed) Labs Reviewed - No data to display  EKG None  Radiology DG Ankle Complete Left  Result Date: 07/27/2021 CLINICAL DATA:  Twisting injury, ankle pain and  swelling EXAM: LEFT ANKLE COMPLETE - 3+ VIEW COMPARISON:  Foot radiographs 07/27/2021 FINDINGS: Prominent soft tissue swelling anterior to and lateral to the ankle. The anterior tibial rim irregularity shown on the previous lateral foot radiograph is less readily apparent on the ankle radiograph, possibly due to obliquity. This irregularity and might have been along the tibial side of the anterior tibiofibular articulation. Faint linear calcification below the lateral malleolus is nonspecific but  could represent a small avulsion particularly in light of the degree of overlying soft tissue swelling. IMPRESSION: 1. Small fleck of bone beneath the lateral malleolus with prominent overlying soft tissue swelling. This may well represent an avulsion. Consider ankle CT or MRI for further characterization. Electronically Signed   By: Van Clines M.D.   On: 07/27/2021 17:15   DG Foot Complete Left  Result Date: 07/27/2021 CLINICAL DATA:  Left foot and ankle pain. Twisting injury while walking dog. EXAM: LEFT FOOT - COMPLETE 3+ VIEW COMPARISON:  None. FINDINGS: There is substantial soft tissue swelling overlying the lateral malleolus and dorsal to the tibiotalar joint. On the lateral projection, there is scalloped irregularity of the anterior tibial rim at the tibiotalar joint which could be degenerative or an unusual manifestation of fracture in light of the adjacent hematoma/swelling. Plantar calcaneal spur noted. Mild dorsal spurring at the Lisfranc joint. Mild spurring of the first metatarsal head. No malalignment at the Lisfranc joint. Type 1 accessory navicular. Small ossicle near the base of the second metatarsal. IMPRESSION: 1. Irregular lesion of the anterior tibial rim articular surface best seen on the lateral projection with small amorphous calcifications in the region. Although potentially a chronic osteochondral lesion, given the adjacent hematoma/swelling in the soft tissues possibility of a fracture involving the anterior tibial rim is raised. CT of the ankle may be warranted for further characterization. 2. Prominent soft tissue swelling overlying the lateral malleolus. 3. Plantar calcaneal spur. Electronically Signed   By: Van Clines M.D.   On: 07/27/2021 17:12    Procedures .Ortho Injury Treatment  Date/Time: 07/27/2021 6:24 PM Performed by: Luna Fuse, MD Authorized by: Luna Fuse, MD  Post-procedure neurovascular assessment: post-procedure neurovascularly  intact Comments: Air splint placed to left lower extremity.     Medications Ordered in ED Medications - No data to display  ED Course  I have reviewed the triage vital signs and the nursing notes.  Pertinent labs & imaging results that were available during my care of the patient were reviewed by me and considered in my medical decision making (see chart for details).    MDM Rules/Calculators/A&P                           X-ray imaging concerning for avulsion fracture of the left ankle.  Placed in an Aircast.  Neurovascular intact after placement.  Advising outpatient follow-up with foot and ankle specialist within the week.  Advising immediate return for worsening symptoms pain fevers or any additional concerns.  Final Clinical Impression(s) / ED Diagnoses Final diagnoses:  Fall  Closed avulsion fracture of left ankle, initial encounter    Rx / DC Orders ED Discharge Orders     None        Luna Fuse, MD 07/27/21 [phone]

## 2021-07-29 DIAGNOSIS — M25572 Pain in left ankle and joints of left foot: Secondary | ICD-10-CM | POA: Diagnosis not present

## 2021-07-29 DIAGNOSIS — S82832A Other fracture of upper and lower end of left fibula, initial encounter for closed fracture: Secondary | ICD-10-CM | POA: Diagnosis not present

## 2021-07-31 ENCOUNTER — Other Ambulatory Visit: Payer: Self-pay | Admitting: Registered Nurse

## 2021-07-31 ENCOUNTER — Ambulatory Visit: Payer: Medicare HMO | Admitting: Family Medicine

## 2021-07-31 DIAGNOSIS — B027 Disseminated zoster: Secondary | ICD-10-CM

## 2021-08-05 ENCOUNTER — Other Ambulatory Visit: Payer: Self-pay

## 2021-08-05 ENCOUNTER — Ambulatory Visit (INDEPENDENT_AMBULATORY_CARE_PROVIDER_SITE_OTHER): Payer: Medicare HMO | Admitting: Registered Nurse

## 2021-08-05 ENCOUNTER — Encounter: Payer: Self-pay | Admitting: Registered Nurse

## 2021-08-05 VITALS — BP 110/70 | HR 72 | Temp 97.6°F | Resp 18 | Ht 69.0 in | Wt 219.0 lb

## 2021-08-05 DIAGNOSIS — Z23 Encounter for immunization: Secondary | ICD-10-CM

## 2021-08-05 DIAGNOSIS — B0229 Other postherpetic nervous system involvement: Secondary | ICD-10-CM | POA: Diagnosis not present

## 2021-08-05 MED ORDER — FEXOFENADINE HCL 180 MG PO TABS: 180.0000 mg | ORAL_TABLET | Freq: Every day | ORAL | 3 refills | Status: DC

## 2021-08-05 MED ORDER — GABAPENTIN 600 MG PO TABS: 1200.0000 mg | ORAL_TABLET | Freq: Three times a day (TID) | ORAL | 0 refills | Status: DC

## 2021-08-05 MED ORDER — HYDROXYZINE HCL 10 MG PO TABS: 5.0000 mg | ORAL_TABLET | Freq: Three times a day (TID) | ORAL | 0 refills | Status: DC | PRN

## 2021-08-05 NOTE — Progress Notes (Signed)
Established Patient Office Visit  Subjective:  Patient ID: [name], female    DOB: [birth year]  Age: 65 y.o. MRN: [id number]  CC:  Chief Complaint  Patient presents with   Follow-up    Patient states she is following up on her shingles and also would like to discuss immunizations    HPI [name] presents for ongoingshingles  Post herpetic neuralgia.  Has been taking gabapentin [id number] Improved, but not resolved Still occasional shots of pain ,disseminated.  Otherwise no concerns  Interested in flu shot today.   Past Medical History:  Diagnosis Date   Allergic rhinitis    Allergy    Anxiety    Arthritis    R knee, Left Knee   Asthma    Bipolar disorder ([place])    Cataract    Complication of anesthesia    had bad anxiety after anesthesia   Constipation, chronic    Depression    Diabetes mellitus without complication ([facility])    Dysrhythmia    Palpations occ, takes Tenormin   GERD (gastroesophageal reflux disease)    Glaucoma    Heart murmur    Hx of adenomatous colonic polyps 2008   Hyperlipidemia    Hypertension    MVP (mitral valve prolapse)    Sleep apnea    wears CPAP   Urinary tract infection    frequent, see Urololgist 12/16/2010    Past Surgical History:  Procedure Laterality Date   ABDOMINAL HYSTERECTOMY  2002   no oophorectomy   BACK SURGERY  2001   L5 - S1   CATARACT EXTRACTION     COLONOSCOPY     NASAL SINUS SURGERY  01/2010   x2   TONSILLECTOMY     TOTAL KNEE ARTHROPLASTY  12/23/2011   Procedure: TOTAL KNEE ARTHROPLASTY;  Surgeon: Ninetta Lights, MD;  Location: [facility];  Service: Orthopedics;  Laterality: Right;  DR MURPHY WANTS 120 MINUTES FOR SURGERY   TOTAL KNEE ARTHROPLASTY  08/31/2012   Procedure: TOTAL KNEE ARTHROPLASTY;  Surgeon: Ninetta Lights, MD;  Location: [facility];  Service: Orthopedics;  Laterality: Left;    Family History  Problem Relation Age of Onset   Lung disease Mother        Mycobacterium avium  intracellulare   Coronary artery disease Father    Heart attack Father 70   Heart disease Father    Hypertension Brother    Hyperlipidemia Brother    Alcohol abuse Brother    Anxiety disorder Brother    Healthy Brother    Diabetes Maternal Grandmother    Stroke Maternal Grandmother    Stroke Maternal Grandfather    Heart attack Paternal Grandmother    Anesthesia problems Neg Hx    Colon cancer Neg Hx    Esophageal cancer Neg Hx    Rectal cancer Neg Hx    Stomach cancer Neg Hx     Social History   Socioeconomic History   Marital status: Single    Spouse name: Not on file   Number of children: 0   Years of education: 13   Highest education level: Some college, no degree  Occupational History   Occupation: retired//disability    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 No kids. Grew up in [place]. Had a great childhood. Has 2 older brothers.   Mom was a stay at home mom. Her dad worked at [employer] as a Banker. He died when pt was 65yo.    Never abused.       Disabled d/t Bipolar since 1992.   Religion-Christian   No legal issues.    Lives alone. Has 1 dog. Has pet sitting business.   2 cups of coffee a day.   Social Determinants of Health   Financial Resource Strain: Low Risk    Difficulty of Paying Living Expenses: Not very hard  Food Insecurity: No Food Insecurity   Worried About Charity fundraiser in the Last Year: Never true   Ran Out of Food in the Last Year: Never true  Transportation Needs: No Transportation Needs   Lack of Transportation (Medical): No    Lack of Transportation (Non-Medical): No  Physical Activity: Sufficiently Active   Days of Exercise per Week: 7 days   Minutes of Exercise per Session: 30 min  Stress: Stress Concern Present   Feeling of Stress : Rather much  Social Connections: Socially Isolated   Frequency of Communication with Friends and Family: Twice a week   Frequency of Social Gatherings with Friends and Family: Never   Attends Religious Services: 1 to 4 times per year   Active Member of [employer] or Organizations: No   Attends Archivist Meetings: Never   Marital Status: Divorced  Human resources officer Violence: Not At Risk   Fear of Current or Ex-Partner: No   Emotionally Abused: No   Physically Abused: No   Sexually Abused: No    Outpatient Medications Prior to Visit  Medication Sig Dispense Refill   Alirocumab (PRALUENT) 150 MG/ML SOAJ Inject 150 mg into the skin every 14 (fourteen) days. 6 mL 3   ALPHAGAN P 0.1 % SOLN Place 1 drop into both eyes every 8 (eight) hours.      azelastine (ASTELIN) 0.1 % nasal spray PLACE 2 SPRAYS INTO EACH NOSTRIL TWO TIMES A DAY AS DIRECTED 30 mL 2   budesonide-formoterol (SYMBICORT) 160-4.5 MCG/ACT inhaler Inhale 2 puffs into the lungs in the morning and at bedtime. 10.2 each 6   Cholecalciferol (VITAMIN D3) 50 MCG (2000 UT) CAPS Take 2,000 Units by mouth daily.     CLARAVIS 20 MG capsule Take 20 mg by mouth daily.     clonazePAM (KLONOPIN) 2 MG tablet 1 mg 2 (two) times daily.   0   Cyanocobalamin (VITAMIN B-12 PO) Take by mouth.     fenofibrate (TRICOR) 145 MG tablet Take 1 tablet (145 mg total) by mouth daily. 90 tablet 1   fluticasone (FLONASE) 50 MCG/ACT nasal spray SPRAY TWO SPRAYS IN EACH NOSTRIL ONCE DAILY 16 g 3   gabapentin (NEURONTIN) 600 MG tablet Take 1,200 mg by mouth 2 (two) times daily.      ISOtretinoin (CLARAVIS PO) Take by mouth daily.     lamoTRIgine (LAMICTAL) 200 MG tablet Take 200 mg by mouth 2 (two) times daily.     LUMIGAN 0.01 % SOLN Place 1  drop into both eyes at bedtime.      meloxicam (MOBIC) 15 MG tablet Take 1 tablet (15 mg total) by mouth daily.  30 tablet 3   metoprolol succinate (TOPROL-XL) 50 MG 24 hr tablet TAKE ONE TABLET BY MOUTH DAILY WITH A MEAL 90 tablet 2   montelukast (SINGULAIR) 10 MG tablet Take 1 tablet (10 mg total) by mouth at bedtime. 90 tablet 1   Multiple Vitamin (MULTIVITAMIN WITH MINERALS) TABS tablet Take 1 tablet by mouth daily.     OLANZapine (ZYPREXA) 5 MG tablet Take 5 mg by mouth daily as needed.     omega-3 acid ethyl esters (LOVAZA) 1 g capsule Take 2 capsules (2 g total) by mouth 2 (two) times daily. 360 capsule 0   OVER THE COUNTER MEDICATION Senna tablets- 8.6 mg daily     Pyridoxine HCl (VITAMIN B-6 PO) Take by mouth.     QUEtiapine (SEROQUEL XR) 300 MG 24 hr tablet Take 2 tablets (600 mg total) by mouth at bedtime. (Patient taking differently: Take 600 mg by mouth at bedtime. Taking 1 400 mg tablet at bedtime, takes 50 mg daily as needed) 60 tablet 1   triamcinolone cream (KENALOG) 0.1 % Apply 1 application topically 2 (two) times daily. 30 g 0   UNABLE TO FIND CPAP     valACYclovir (VALTREX) 1000 MG tablet TAKE ONE TABLET BY MOUTH THREE TIMES A DAY 21 tablet 0   venlafaxine (EFFEXOR-XR) 75 MG 24 hr capsule Take 75 mg by mouth daily. 2 po q am, 1 po q afternoon     fexofenadine (ALLEGRA) 180 MG tablet Take 180 mg by mouth daily.     No facility-administered medications prior to visit.    Allergies  Allergen Reactions   Nitrofurantoin Monohyd Macro Nausea Only    Severe headache   Statins Other (See Comments)    Severe weakness, pain   Sulfa Antibiotics Nausea And Vomiting   Alprazolam Anxiety and Other (See Comments)    Hyper    Amoxicillin Rash   Prednisone Anxiety    ROS Review of Systems Per hpi     Objective:    Physical Exam Vitals and nursing note reviewed.  Constitutional:      General: She is not in acute distress.    Appearance: Normal appearance. She is normal  weight. She is not ill-appearing, toxic-appearing or diaphoretic.  Cardiovascular:     Rate and Rhythm: Normal rate and regular rhythm.     Heart sounds: Normal heart sounds. No murmur heard.   No friction rub. No gallop.  Pulmonary:     Effort: Pulmonary effort is normal. No respiratory distress.     Breath sounds: Normal breath sounds. No stridor. No wheezing, rhonchi or rales.  Chest:     Chest wall: No tenderness.  Skin:    General: Skin is warm and dry.  Neurological:     General: No focal deficit present.     Mental Status: She is alert and oriented to person, place, and time. Mental status is at baseline.  Psychiatric:        Mood and Affect: Mood normal.        Behavior: Behavior normal.        Thought Content: Thought content normal.        Judgment: Judgment normal.    BP 110/70   Pulse 72   Temp 97.6 F (36.4 C) (Temporal)   Resp 18   Ht 5\' 9"  (1.753 m)   Wt 219 lb (99.3 kg)   LMP 11/09/2000   SpO2 99%   BMI 32.34 kg/m  Wt Readings from Last 3 Encounters:  08/05/21 219 lb (99.3 kg)  07/21/21 217 lb 9.6 oz (98.7 kg)  07/16/21 216 lb 12.8 oz (98.3 kg)     Health Maintenance Due  Topic Date Due   OPHTHALMOLOGY EXAM  08/02/2021    There are no preventive care reminders to display for this patient.  Lab Results  Component Value Date   TSH 1.29 04/09/2021   Lab Results  Component Value Date   WBC 4.4 04/09/2021   HGB 12.6 04/09/2021   HCT 36.9 04/09/2021   MCV 88.4 04/09/2021   PLT 220.0 04/09/2021   Lab Results  Component Value Date   NA 142 04/09/2021   K 4.0 04/09/2021   CO2 21 04/09/2021   GLUCOSE 115 (H) 04/09/2021   BUN 16 04/09/2021   CREATININE 0.67 04/09/2021   BILITOT 0.6 04/09/2021   ALKPHOS 29 (L) 04/09/2021   AST 18 04/09/2021   ALT 20 04/09/2021   PROT 7.2 04/09/2021   ALBUMIN 5.0 04/09/2021   CALCIUM 10.0 04/09/2021   GFR 92.86 04/09/2021   Lab Results  Component Value Date   CHOL 148 04/09/2021   Lab Results   Component Value Date   HDL 66.30 04/09/2021   Lab Results  Component Value Date   LDLCALC 44 04/09/2021   Lab Results  Component Value Date   TRIG 189.0 (H) 04/09/2021   Lab Results  Component Value Date   CHOLHDL 2 04/09/2021   Lab Results  Component Value Date   HGBA1C 7.4 (H) 04/09/2021      Assessment & Plan:   Problem List Items Addressed This Visit   None Visit Diagnoses     Postherpetic neuralgia    -  Primary   Relevant Medications   gabapentin (NEURONTIN) 600 MG tablet   hydrOXYzine (ATARAX/VISTARIL) 10 MG tablet   fexofenadine (ALLEGRA) 180 MG tablet   Flu vaccine need       Relevant Orders   Flu Vaccine QUAD 6+ mos PF IM (Fluarix Quad PF)       Meds ordered this encounter  Medications   gabapentin (NEURONTIN) 600 MG tablet    Sig: Take 2 tablets (1,200 mg total) by mouth 3 (three) times daily.    Dispense:  90 tablet    Refill:  0    Order Specific Question:   Supervising Provider    Answer:   Carlota Raspberry, JEFFREY R [[id number]]   hydrOXYzine (ATARAX/VISTARIL) 10 MG tablet    Sig: Take 0.5-1 tablets (5-10 mg total) by mouth 3 (three) times daily as needed for itching.    Dispense:  30 tablet    Refill:  0    Order Specific Question:   Supervising Provider    Answer:   Carlota Raspberry, JEFFREY R [[id number]]   fexofenadine (ALLEGRA) 180 MG tablet    Sig: Take 1 tablet (180 mg total) by mouth daily.    Dispense:  90 tablet    Refill:  3    Order Specific Question:   Supervising Provider    Answer:   Carlota Raspberry, JEFFREY R [[id number]]    Follow-up: Return if symptoms worsen or fail to improve.   PLAN Gabapentin increase to 1200 tid  Start hydroxyzine 5-10mg  po tid prn Continue allegra 180mg  po qd. Return if worsening or failing to improve after 12 weeks Patient encouraged to call clinic with any questions, comments, or concerns.  Maximiano Coss, NP

## 2021-08-05 NOTE — Patient Instructions (Addendum)
Ms. Saldarriaga -   Jim Desanctis things are improving. May take a number of weeks to totally resolve.  Let me know if things get worse  In the mean time, increase gabapentin to 1200mg  three times daily. Can reduce in the next few weeks as able. Reduce to [phone]. Then down to 1200-1200.  Flu shot given today  Ok to get COVID booster when it works for you.  Thank you  Rich     If you have lab work done today you will be contacted with your lab results within the next 2 weeks.  If you have not heard from [place] then please contact us. The fastest way to get your results is to register for My Chart.   IF you received an x-ray today, you will receive an invoice from [facility]. Please contact [facility] at [phone] with questions or concerns regarding your invoice.   IF you received labwork today, you will receive an invoice from [facility]. Please contact [facility] at [phone] with questions or concerns regarding your invoice.   Our billing staff will not be able to assist you with questions regarding bills from these companies.  You will be contacted with the lab results as soon as they are available. The fastest way to get your results is to activate your My Chart account. Instructions are located on the last page of this paperwork. If you have not heard from [place] regarding the results in 2 weeks, please contact this office.

## 2021-08-11 ENCOUNTER — Other Ambulatory Visit: Payer: Self-pay | Admitting: Registered Nurse

## 2021-08-11 DIAGNOSIS — B027 Disseminated zoster: Secondary | ICD-10-CM

## 2021-08-15 ENCOUNTER — Telehealth: Payer: Self-pay

## 2021-08-15 NOTE — Progress Notes (Signed)
    Chronic Care Management Pharmacy Assistant   Name: TODD ARGABRIGHT  MRN: [id number] DOB: August 08, 1956   Reason for Encounter: CMA phone call about [facility] PAP order   Patient called and stated she was confused about her [employer] PAP order. She used to received automatically and had to call for an order this time. She states she had to get samples from her doctors office because she ran out. I called PAP on behalf of patient at [phone] and spoke to [place] and she confirmed a delivery was processed on 08/06/21 and scheduled for delivery on 08/19/21. She advised patient will need to call in refills and to call once she opened her last box. They were not able to do automatic refill orders.  Confirmed her next delivery due is 10/13/21. I called patient back and explained process and advised her of the upcoming delivery date. She voiced understanding and thanked me for the call.   Patient also updated status on her [employer] PAP application and states it was approved through 10/10/21. Have updated tracking panel and noted to re apply for her in November for 2023 year.  Jobe Gibbon, [place] Pharmacist Assistant  [phone]  Time Spent: 42 minutes

## 2021-08-18 DIAGNOSIS — R69 Illness, unspecified: Secondary | ICD-10-CM | POA: Diagnosis not present

## 2021-08-18 DIAGNOSIS — F3132 Bipolar disorder, current episode depressed, moderate: Secondary | ICD-10-CM | POA: Diagnosis not present

## 2021-08-19 ENCOUNTER — Other Ambulatory Visit: Payer: Self-pay

## 2021-08-19 DIAGNOSIS — I10 Essential (primary) hypertension: Secondary | ICD-10-CM

## 2021-08-19 MED ORDER — METOPROLOL SUCCINATE ER 50 MG PO TB24
ORAL_TABLET | ORAL | 2 refills | Status: DC
Start: 2021-08-19 — End: 2022-06-01

## 2021-08-20 ENCOUNTER — Other Ambulatory Visit: Payer: Self-pay

## 2021-08-20 ENCOUNTER — Encounter: Payer: Self-pay | Admitting: Registered Nurse

## 2021-08-20 ENCOUNTER — Ambulatory Visit (INDEPENDENT_AMBULATORY_CARE_PROVIDER_SITE_OTHER): Payer: Medicare HMO | Admitting: Registered Nurse

## 2021-08-20 VITALS — BP 110/65 | HR 74 | Temp 98.2°F | Resp 18 | Ht 69.0 in | Wt 221.8 lb

## 2021-08-20 DIAGNOSIS — H9311 Tinnitus, right ear: Secondary | ICD-10-CM

## 2021-08-20 NOTE — Patient Instructions (Signed)
Ms. Rynders -   Doristine Devoid to see you  Let's get you in with ENT  They'll call you soon  Thanks,  [name]

## 2021-08-20 NOTE — Progress Notes (Signed)
Established Patient Office Visit  Subjective:  Patient ID: [name], female    DOB: January 10, 1956  Age: 65 y.o. MRN: [id number]  CC:  Chief Complaint  Patient presents with   Tinnitus    Patient states she has been having some ringing of the ears for 2 weeks and also some head congestion and thinks its related to sinus issues.    HPI [name] presents for ongoing ringing in ears Can still hear - muffled, ringing.  Onset shortly after last visit with me, two weeks ago  It seems to be worse in mornings Only on R ear, left ear is spared.  No other concerns.   Past Medical History:  Diagnosis Date   Allergic rhinitis    Allergy    Anxiety    Arthritis    R knee, Left Knee   Asthma    Bipolar disorder ([place])    Cataract    Complication of anesthesia    had bad anxiety after anesthesia   Constipation, chronic    Depression    Diabetes mellitus without complication (HCC)    Dysrhythmia    Palpations occ, takes Tenormin   GERD (gastroesophageal reflux disease)    Glaucoma    Heart murmur    Hx of adenomatous colonic polyps 2008   Hyperlipidemia    Hypertension    MVP (mitral valve prolapse)    Sleep apnea    wears CPAP   Urinary tract infection    frequent, see Urololgist 12/16/2010    Past Surgical History:  Procedure Laterality Date   ABDOMINAL HYSTERECTOMY  2002   no oophorectomy   BACK SURGERY  2001   L5 - S1   CATARACT EXTRACTION     COLONOSCOPY     NASAL SINUS SURGERY  01/2010   x2   TONSILLECTOMY     TOTAL KNEE ARTHROPLASTY  12/23/2011   Procedure: TOTAL KNEE ARTHROPLASTY;  Surgeon: Ninetta Lights, MD;  Location: [place];  Service: Orthopedics;  Laterality: Right;  DR MURPHY WANTS 120 MINUTES FOR SURGERY   TOTAL KNEE ARTHROPLASTY  08/31/2012   Procedure: TOTAL KNEE ARTHROPLASTY;  Surgeon: Ninetta Lights, MD;  Location: [place];  Service: Orthopedics;  Laterality: Left;    Family History  Problem Relation Age of Onset   Lung disease Mother         Mycobacterium avium intracellulare   Coronary artery disease Father    Heart attack Father 28   Heart disease Father    Hypertension Brother    Hyperlipidemia Brother    Alcohol abuse Brother    Anxiety disorder Brother    Healthy Brother    Diabetes Maternal Grandmother    Stroke Maternal Grandmother    Stroke Maternal Grandfather    Heart attack Paternal Grandmother    Anesthesia problems Neg Hx    Colon cancer Neg Hx    Esophageal cancer Neg Hx    Rectal cancer Neg Hx    Stomach cancer Neg Hx     Social History   Socioeconomic History   Marital status: Single    Spouse name: Not on file   Number of children: 0   Years of education: 13   Highest education level: Some college, no degree  Occupational History   Occupation: retired//disability    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 No kids. Grew up in [place]. Had a great childhood. Has 2 older brothers.   Mom was a stay at home mom. Her dad worked at [employer] as a Banker. He died when pt was 65yo.    Never abused.       Disabled d/t Bipolar since 1992.   Religion-Christian   No legal issues.    Lives alone. Has 1 dog. Has pet sitting business.   2 cups of coffee a day.   Social Determinants of Health   Financial Resource Strain: Low Risk    Difficulty of Paying Living Expenses: Not very hard  Food Insecurity: No Food Insecurity   Worried About Charity fundraiser in the Last Year: Never true   Ran Out of Food in the Last Year: Never true  Transportation Needs: No Transportation Needs   Lack of  Transportation (Medical): No   Lack of Transportation (Non-Medical): No  Physical Activity: Sufficiently Active   Days of Exercise per Week: 7 days   Minutes of Exercise per Session: 30 min  Stress: Stress Concern Present   Feeling of Stress : Rather much  Social Connections: Socially Isolated   Frequency of Communication with Friends and Family: Twice a week   Frequency of Social Gatherings with Friends and Family: Never   Attends Religious Services: 1 to 4 times per year   Active Member of [employer] or Organizations: No   Attends Archivist Meetings: Never   Marital Status: Divorced  Human resources officer Violence: Not At Risk   Fear of Current or Ex-Partner: No   Emotionally Abused: No   Physically Abused: No   Sexually Abused: No    Outpatient Medications Prior to Visit  Medication Sig Dispense Refill   Alirocumab (PRALUENT) 150 MG/ML SOAJ Inject 150 mg into the skin every 14 (fourteen) days. 6 mL 3   ALPHAGAN P 0.1 % SOLN Place 1 drop into both eyes every 8 (eight) hours.      azelastine (ASTELIN) 0.1 % nasal spray PLACE 2 SPRAYS INTO EACH NOSTRIL TWO TIMES A DAY AS DIRECTED 30 mL 2   budesonide-formoterol (SYMBICORT) 160-4.5 MCG/ACT inhaler Inhale 2 puffs into the lungs in the morning and at bedtime. 10.2 each 6   Cholecalciferol (VITAMIN D3) 50 MCG (2000 UT) CAPS Take 2,000 Units by mouth daily.     CLARAVIS 20 MG capsule Take 20 mg by mouth daily.     clonazePAM (KLONOPIN) 2 MG tablet 1 mg 2 (two) times daily.   0   Cyanocobalamin (VITAMIN B-12 PO) Take by mouth.     fenofibrate (TRICOR) 145 MG tablet Take 1 tablet (145 mg total) by mouth daily. 90 tablet 1   fexofenadine (ALLEGRA) 180 MG tablet Take 1 tablet (180 mg total) by mouth daily. 90 tablet 3   fluticasone (FLONASE) 50 MCG/ACT nasal spray SPRAY TWO SPRAYS IN EACH NOSTRIL ONCE DAILY 16 g 3   gabapentin (NEURONTIN) 600 MG tablet Take 1,200 mg by mouth 2 (two) times daily.      gabapentin (NEURONTIN) 600 MG tablet  Take 2 tablets (1,200 mg total) by mouth 3 (three) times daily. [place]  tablet 0   ISOtretinoin (CLARAVIS PO) Take by mouth daily.     lamoTRIgine (LAMICTAL) 200 MG tablet Take 200 mg by mouth 2 (two) times daily.     LUMIGAN 0.01 % SOLN Place 1 drop into both eyes at bedtime.      meloxicam (MOBIC) 15 MG tablet Take 1 tablet (15 mg total) by mouth daily. 30 tablet 3   metoprolol succinate (TOPROL-XL) 50 MG 24 hr tablet TAKE ONE TABLET BY MOUTH DAILY WITH A MEAL 90 tablet 2   montelukast (SINGULAIR) 10 MG tablet Take 1 tablet (10 mg total) by mouth at bedtime. 90 tablet 1   Multiple Vitamin (MULTIVITAMIN WITH MINERALS) TABS tablet Take 1 tablet by mouth daily.     OLANZapine (ZYPREXA) 5 MG tablet Take 5 mg by mouth daily as needed.     omega-3 acid ethyl esters (LOVAZA) 1 g capsule Take 2 capsules (2 g total) by mouth 2 (two) times daily. 360 capsule 0   OVER THE COUNTER MEDICATION Senna tablets- 8.6 mg daily     Pyridoxine HCl (VITAMIN B-6 PO) Take by mouth.     QUEtiapine (SEROQUEL XR) 300 MG 24 hr tablet Take 2 tablets (600 mg total) by mouth at bedtime. (Patient taking differently: Take 600 mg by mouth at bedtime. Taking 1 400 mg tablet at bedtime, takes 50 mg daily as needed) 60 tablet 1   triamcinolone cream (KENALOG) 0.1 % Apply 1 application topically 2 (two) times daily. 30 g 0   UNABLE TO FIND CPAP     valACYclovir (VALTREX) 1000 MG tablet TAKE ONE TABLET BY MOUTH THREE TIMES A DAY 21 tablet 0   venlafaxine (EFFEXOR-XR) 75 MG 24 hr capsule Take 75 mg by mouth daily. 2 po q am, 1 po q afternoon     hydrOXYzine (ATARAX/VISTARIL) 10 MG tablet Take 0.5-1 tablets (5-10 mg total) by mouth 3 (three) times daily as needed for itching. (Patient not taking: Reported on 08/20/2021) 30 tablet 0   No facility-administered medications prior to visit.    Allergies  Allergen Reactions   Nitrofurantoin Monohyd Macro Nausea Only    Severe headache   Statins Other (See Comments)    Severe weakness, pain    Sulfa Antibiotics Nausea And Vomiting   Alprazolam Anxiety and Other (See Comments)    Hyper    Amoxicillin Rash   Prednisone Anxiety    ROS Review of Systems  Constitutional: Negative.   HENT:  Positive for ear pain (pressure) and hearing loss.   Eyes: Negative.   Respiratory: Negative.    Cardiovascular: Negative.   Gastrointestinal: Negative.   Genitourinary: Negative.   Musculoskeletal: Negative.   Skin: Negative.   Neurological: Negative.   Psychiatric/Behavioral: Negative.    All other systems reviewed and are negative.    Objective:    Physical Exam Vitals and nursing note reviewed.  Constitutional:      General: She is not in acute distress.    Appearance: Normal appearance. She is normal weight. She is not ill-appearing, toxic-appearing or diaphoretic.  HENT:     Head: Normocephalic and atraumatic.     Right Ear: Tympanic membrane, ear canal and external ear normal. There is no impacted cerumen.     Left Ear: Tympanic membrane, ear canal and external ear normal. There is no impacted cerumen.  Cardiovascular:     Rate and Rhythm: Normal rate and regular rhythm.     Heart sounds: Normal heart sounds. No murmur heard.   No  friction rub. No gallop.  Pulmonary:     Effort: Pulmonary effort is normal. No respiratory distress.     Breath sounds: Normal breath sounds. No stridor. No wheezing, rhonchi or rales.  Chest:     Chest wall: No tenderness.  Skin:    General: Skin is warm and dry.  Neurological:     General: No focal deficit present.     Mental Status: She is alert and oriented to person, place, and time. Mental status is at baseline.  Psychiatric:        Mood and Affect: Mood normal.        Behavior: Behavior normal.        Thought Content: Thought content normal.        Judgment: Judgment normal.    BP 110/65   Pulse 74   Temp 98.2 F (36.8 C) (Temporal)   Resp 18   Ht 5\' 9"  (1.753 m)   Wt 221 lb 12.8 oz (100.6 kg)   LMP 11/09/2000   BMI  32.75 kg/m  Wt Readings from Last 3 Encounters:  08/20/21 221 lb 12.8 oz (100.6 kg)  08/05/21 219 lb (99.3 kg)  07/21/21 217 lb 9.6 oz (98.7 kg)     Health Maintenance Due  Topic Date Due   OPHTHALMOLOGY EXAM  08/02/2021    There are no preventive care reminders to display for this patient.  Lab Results  Component Value Date   TSH 1.29 04/09/2021   Lab Results  Component Value Date   WBC 4.4 04/09/2021   HGB 12.6 04/09/2021   HCT 36.9 04/09/2021   MCV 88.4 04/09/2021   PLT 220.0 04/09/2021   Lab Results  Component Value Date   NA 142 04/09/2021   K 4.0 04/09/2021   CO2 21 04/09/2021   GLUCOSE 115 (H) 04/09/2021   BUN 16 04/09/2021   CREATININE 0.67 04/09/2021   BILITOT 0.6 04/09/2021   ALKPHOS 29 (L) 04/09/2021   AST 18 04/09/2021   ALT 20 04/09/2021   PROT 7.2 04/09/2021   ALBUMIN 5.0 04/09/2021   CALCIUM 10.0 04/09/2021   GFR 92.86 04/09/2021   Lab Results  Component Value Date   CHOL 148 04/09/2021   Lab Results  Component Value Date   HDL 66.30 04/09/2021   Lab Results  Component Value Date   LDLCALC 44 04/09/2021   Lab Results  Component Value Date   TRIG 189.0 (H) 04/09/2021   Lab Results  Component Value Date   CHOLHDL 2 04/09/2021   Lab Results  Component Value Date   HGBA1C 7.4 (H) 04/09/2021      Assessment & Plan:   Problem List Items Addressed This Visit   None Visit Diagnoses     Tinnitus of right ear    -  Primary   Relevant Orders   Ambulatory referral to ENT       No orders of the defined types were placed in this encounter.   Follow-up: Return if symptoms worsen or fail to improve.   PLAN Exam unremarkable Refer to ENT Suggest decongestants Patient encouraged to call clinic with any questions, comments, or concerns.  Maximiano Coss, NP

## 2021-08-22 ENCOUNTER — Other Ambulatory Visit: Payer: Self-pay | Admitting: Registered Nurse

## 2021-08-22 ENCOUNTER — Other Ambulatory Visit: Payer: Self-pay

## 2021-08-22 ENCOUNTER — Telehealth: Payer: Self-pay

## 2021-08-22 DIAGNOSIS — B027 Disseminated zoster: Secondary | ICD-10-CM

## 2021-08-22 MED ORDER — AZELASTINE HCL 0.1 % NA SOLN: NASAL | 2 refills | Status: AC

## 2021-08-22 NOTE — Telephone Encounter (Signed)
Caller name:Vernisha Baties   On DPR? :no   Call back number:[phone]  Provider they see: Birdie Riddle  Past appt 08/20/21 with Delfino Lovett New appt 09/03/21 with Birdie Riddle   Reason for call:Pt has pharmacy call and they need pro authorization on azelastine (ASTELIN) 0.1 % nasal spray ? Pt has not had this since last July Pt said she needs refill on this medication if this has not been sent to the pharmacy?    [facility] [id number] - [place], [address]

## 2021-08-22 NOTE — Telephone Encounter (Signed)
Rx sent to pharmacy   

## 2021-08-25 ENCOUNTER — Other Ambulatory Visit: Payer: Self-pay

## 2021-08-25 ENCOUNTER — Ambulatory Visit (INDEPENDENT_AMBULATORY_CARE_PROVIDER_SITE_OTHER): Payer: Medicare HMO | Admitting: Family Medicine

## 2021-08-25 ENCOUNTER — Ambulatory Visit: Payer: Medicare HMO | Admitting: Dietician

## 2021-08-25 VITALS — BP 136/70 | HR 76 | Temp 98.2°F | Resp 16 | Ht 69.0 in | Wt 220.8 lb

## 2021-08-25 DIAGNOSIS — B028 Zoster with other complications: Secondary | ICD-10-CM

## 2021-08-25 DIAGNOSIS — R208 Other disturbances of skin sensation: Secondary | ICD-10-CM | POA: Diagnosis not present

## 2021-08-25 NOTE — Patient Instructions (Addendum)
I will reach out to Dr. Rexene Alberts to discuss your symptoms and plan, but may also need to discuss your meds and symptoms with your psychiatrist as well - let them know about your higher dose of gabapentin. No med changes for now.

## 2021-08-25 NOTE — Progress Notes (Signed)
Subjective:  Patient ID: [name], female    DOB: 1956-05-05  Age: 65 y.o. MRN: [id number]  CC:  Chief Complaint  Patient presents with   Herpes Zoster    Pt had shingles 07/23/21 reports rash has cleared but she is still having pins and needle pain reports has tried gabapentin but reports little to no relief     HPI [name] presents for   Herpes zoster: Left leg - Evaluated September 12 by my colleague.  Rash had been present for 4 to 5 days previously. Per initial note - had linear vesicular rash on anterior lower left leg, some vesicles on R leg, R arm, but isolated and no clear pattern.  Treated with Valtrex 1000 mg 3 times daily. Not on immunosuppressant.   Previously on gabapentin 1200 mg twice daily ( for her nerves - prescribed by psychiatry) added additional 600 mg in the afternoon. Follow-up visit with Rich on September 27, still having pain at that dose of gabapentin.  Treated for postherpetic neuralgia.  Increased her gabapentin to 1200 mg 3 times daily, hydroxyzine added 5 to 10 mg 3 times daily as needed, continued on Allegra.  Taking gabapentin 1200mg  tid.  Left leg pain better, but now having stinging and tingling all over - arms, stomach, down back, scalp sometimes, feet and hands at times. Noticed some of these symptoms after the shingles but feels like getting worse. Waves of intermittent tingling. Sometimes heat wave down the back.  Higher dose of gabapentin not helping.  Unable to take hydroxyzine - too sedating.  Notices more in evening.  Dr. Rexene Alberts is neurologist.   Followed by Dr. Georgiann Cocker for psychiatry - sees [name]. Has not discussed these sx's with psychiatry. No a/v/t hallucinations. No SI/HI. Feels like mental health doing ok.  On seroquel 400mg  at bedtime, then 50mg  during day if needed for anxiety.  Klonopin 1mg  QID.  Zyprexa as needed for anxiety - once per week or less.  Also on lamictal, effexor.               History Patient Active Problem List   Diagnosis Date Noted   Hx of adenomatous colonic polyps 07/2021   Sleep related hypoxia 12/17/2020   Cervical myelopathy ([place]) 10/16/2020   Elevated blood-pressure reading, without diagnosis of hypertension 10/16/2020   Lumbar radiculopathy 10/16/2020   Positive ANA (antinuclear antibody) 08/14/2020   Chronically dry eyes, bilateral 08/14/2020   Dry mouth 08/14/2020   Allergic rhinitis due to pollen 12/13/2018   Diet-controlled diabetes mellitus ([place]) 02/09/2018   Statin intolerance 11/04/2017   Family history of early CAD 03/10/2017   HTN (hypertension) 02/15/2017   Herniated nucleus pulposus, L5-S1 06/22/2014   Asthma, moderate persistent 10/09/2013   Obesity (BMI 30-39.9) 10/09/2013   Fatigue 12/12/2012   GERD (gastroesophageal reflux disease) 09/02/2012   Vitamin D deficiency 09/15/2010   DEPRESSIVE DISORDER 07/17/2010   RHINITIS 06/14/2009   MUSCLE WEAKNESS (GENERALIZED) 01/03/2009   Hyperlipidemia 10/10/2008   BIPOLAR AFFECTIVE DISORDER 10/10/2008   ADD 10/10/2008   Past Medical History:  Diagnosis Date   Allergic rhinitis    Allergy    Anxiety    Arthritis    R knee, Left Knee   Asthma    Bipolar disorder ([place])    Cataract    Complication of anesthesia    had bad anxiety after anesthesia   Constipation, chronic    Depression    Diabetes mellitus without complication ([place])    Dysrhythmia  Palpations occ, takes Tenormin   GERD (gastroesophageal reflux disease)    Glaucoma    Heart murmur    Hx of adenomatous colonic polyps 2008   Hyperlipidemia    Hypertension    MVP (mitral valve prolapse)    Sleep apnea    wears CPAP   Urinary tract infection    frequent, see Urololgist 12/16/2010   Past Surgical History:  Procedure Laterality Date   ABDOMINAL HYSTERECTOMY  2002   no oophorectomy   BACK SURGERY  2001   L5 - S1   CATARACT EXTRACTION     COLONOSCOPY     NASAL SINUS SURGERY  01/2010    x2   TONSILLECTOMY     TOTAL KNEE ARTHROPLASTY  12/23/2011   Procedure: TOTAL KNEE ARTHROPLASTY;  Surgeon: Ninetta Lights, MD;  Location: [facility];  Service: Orthopedics;  Laterality: Right;  DR MURPHY WANTS 120 MINUTES FOR SURGERY   TOTAL KNEE ARTHROPLASTY  08/31/2012   Procedure: TOTAL KNEE ARTHROPLASTY;  Surgeon: Ninetta Lights, MD;  Location: [facility];  Service: Orthopedics;  Laterality: Left;   Allergies  Allergen Reactions   Nitrofurantoin Monohyd Macro Nausea Only    Severe headache   Statins Other (See Comments)    Severe weakness, pain   Sulfa Antibiotics Nausea And Vomiting   Alprazolam Anxiety and Other (See Comments)    Hyper    Amoxicillin Rash   Prednisone Anxiety   Prior to Admission medications   Medication Sig Start Date End Date Taking? Authorizing Provider  Alirocumab (PRALUENT) 150 MG/ML SOAJ Inject 150 mg into the skin every 14 (fourteen) days. 12/25/20  Yes Jettie Booze, MD  ALPHAGAN P 0.1 % SOLN Place 1 drop into both eyes every 8 (eight) hours.  05/03/20  Yes [provider]  azelastine (ASTELIN) 0.1 % nasal spray PLACE 2 SPRAYS INTO EACH NOSTRIL TWO TIMES A DAY AS DIRECTED 08/22/21  Yes Midge Minium, MD  budesonide-formoterol [facility]) 160-4.5 MCG/ACT inhaler Inhale 2 puffs into the lungs in the morning and at bedtime. 03/18/21  Yes Brand Males, MD  Cholecalciferol (VITAMIN D3) 50 MCG (2000 UT) CAPS Take 2,000 Units by mouth daily.   Yes [provider]  CLARAVIS 20 MG capsule Take 20 mg by mouth daily. 07/11/21  Yes [provider]  clonazePAM (KLONOPIN) 2 MG tablet 1 mg 2 (two) times daily.  10/23/15  Yes [provider]  Cyanocobalamin (VITAMIN B-12 PO) Take by mouth.   Yes [provider]  fenofibrate (TRICOR) 145 MG tablet Take 1 tablet (145 mg total) by mouth daily. 07/09/21  Yes Midge Minium, MD  fexofenadine (ALLEGRA) 180 MG tablet Take 1 tablet (180 mg total) by mouth daily. 08/05/21  Yes  Maximiano Coss, NP  fluticasone [facility]) 50 MCG/ACT nasal spray SPRAY TWO SPRAYS IN EACH NOSTRIL ONCE DAILY 07/16/21  Yes Midge Minium, MD  gabapentin (NEURONTIN) 600 MG tablet Take 1,200 mg by mouth 2 (two) times daily.    Yes [provider]  gabapentin (NEURONTIN) 600 MG tablet Take 2 tablets (1,200 mg total) by mouth 3 (three) times daily. 08/05/21  Yes Maximiano Coss, NP  ISOtretinoin (CLARAVIS PO) Take by mouth daily.   Yes [provider]  lamoTRIgine (LAMICTAL) 200 MG tablet Take 200 mg by mouth 2 (two) times daily.   Yes [provider]  LUMIGAN 0.01 % SOLN Place 1 drop into both eyes at bedtime.  10/19/19  Yes [provider]  meloxicam (MOBIC) 15  MG tablet Take 1 tablet (15 mg total) by mouth daily. 03/18/21  Yes Midge Minium, MD  metoprolol succinate (TOPROL-XL) 50 MG 24 hr tablet TAKE ONE TABLET BY MOUTH DAILY WITH A MEAL 08/19/21  Yes Midge Minium, MD  montelukast (SINGULAIR) 10 MG tablet Take 1 tablet (10 mg total) by mouth at bedtime. 07/12/20  Yes Midge Minium, MD  Multiple Vitamin (MULTIVITAMIN WITH MINERALS) TABS tablet Take 1 tablet by mouth daily.   Yes [provider]  OLANZapine (ZYPREXA) 5 MG tablet Take 5 mg by mouth daily as needed.   Yes [provider]  omega-3 acid ethyl esters (LOVAZA) 1 g capsule Take 2 capsules (2 g total) by mouth 2 (two) times daily. 07/03/21  Yes Jettie Booze, MD  OVER THE COUNTER MEDICATION [place] tablets- 8.6 mg daily   Yes [provider]  Pyridoxine HCl (VITAMIN B-6 PO) Take by mouth.   Yes [provider]  QUEtiapine (SEROQUEL XR) 300 MG 24 hr tablet Take 2 tablets (600 mg total) by mouth at bedtime. Patient taking differently: Take 600 mg by mouth at bedtime. Taking 1 400 mg tablet at bedtime, takes 50 mg daily as needed 04/09/21  Yes Hurst, Helene Kelp T, PA-C  triamcinolone cream (KENALOG) 0.1 % Apply 1 application topically 2 (two) times daily.  07/21/21  Yes Maximiano Coss, NP  UNABLE TO FIND CPAP   Yes [provider]  valACYclovir (VALTREX) 1000 MG tablet TAKE ONE TABLET BY MOUTH THREE TIMES A DAY 08/22/21  Yes Maximiano Coss, NP  venlafaxine (EFFEXOR-XR) 75 MG 24 hr capsule Take 75 mg by mouth daily. 2 po q am, 1 po q afternoon   Yes [provider]  hydrOXYzine (ATARAX/VISTARIL) 10 MG tablet Take 0.5-1 tablets (5-10 mg total) by mouth 3 (three) times daily as needed for itching. 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 No kids. Grew up in [place]. Had a great childhood. Has 2 older brothers.   Mom was a stay at home mom. Her dad worked at [employer] as a Banker. He died when pt was 65yo.    Never abused.       Disabled d/t Bipolar since 1992.   Religion-Christian   No legal issues.    Lives alone. Has 1 dog. Has pet sitting business.   2 cups of coffee a day.   Social Determinants of Health   Financial Resource Strain: Low Risk    Difficulty of Paying Living Expenses: Not very hard  Food Insecurity: No Food Insecurity   Worried About Running Out of Food in the Last Year: Never true   Ran  Out of Food in the Last Year: Never true  Transportation Needs: No Transportation Needs    Lack of Transportation (Medical): No   Lack of Transportation (Non-Medical): No  Physical Activity: Sufficiently Active   Days of Exercise per Week: 7 days   Minutes of Exercise per Session: 30 min  Stress: Stress Concern Present   Feeling of Stress : Rather much  Social Connections: Socially Isolated   Frequency of Communication with Friends and Family: Twice a week   Frequency of Social Gatherings with Friends and Family: Never   Attends Religious Services: 1 to 4 times per year   Active Member of [employer] or Organizations: No   Attends Archivist Meetings: Never   Marital Status: Divorced  Human resources officer Violence: Not At Risk   Fear of Current or Ex-Partner: No   Emotionally Abused: No   Physically Abused: No   Sexually Abused: No    Review of Systems Per HPI.   Objective:   Vitals:   08/25/21 1604  BP: 136/70  Pulse: 76  Resp: 16  Temp: 98.2 F (36.8 C)  TempSrc: Temporal  SpO2: 95%  Weight: 220 lb 12.8 oz (100.2 kg)  Height: 5\' 9"  (1.753 m)     Physical Exam Vitals reviewed.  Constitutional:      General: She is not in acute distress.    Appearance: Normal appearance. She is well-developed. She is not ill-appearing.  HENT:     Head: Normocephalic and atraumatic.  Cardiovascular:     Rate and Rhythm: Normal rate.  Pulmonary:     Effort: Pulmonary effort is normal.  Skin:    General: Skin is warm and dry.     Findings: No erythema or rash.  Neurological:     Mental Status: She is alert and oriented to person, place, and time.  Psychiatric:        Mood and Affect: Mood normal.        Behavior: Behavior normal.        Thought Content: Thought content normal.       Assessment & Plan:  CHANDEL ZAUN is a 65 y.o. female . Herpes zoster with complication  Dysesthesia  Initial zoster with question of disseminated zoster versus focal symptoms and left leg and different rash on upper extremities after discussion with initial provider.  Rash of  leg improved, symptoms of leg improved leaning more towards focal zoster.  No known history of immunosuppression.  Has had persistent and some worsening intermittent dysesthesias as above in various areas of her body, including areas outside of the initial rash.  No new rash.  Not improving with higher dose of gabapentin and does report increased symptoms.  Question if persistent neuropathic symptoms from previous zoster, possible disseminated zoster initially, versus med side effect as noticed increased symptoms at higher dose gabapentin versus interaction or side effects from combination of her other medications.  We discussed options in the office of higher dose versus lower dose of gabapentin, but ultimately decided to keep meds the same for now, and I will reach out to her neurologist to decide neck step, possible office visit.  Also recommended she discuss the symptoms with her psychiatrist given her other medications.  RTC/ER precautions if acute worsening.  No orders of the defined types were placed in this encounter.  Patient Instructions  I will reach out to Dr. Rexene Alberts to discuss your symptoms and plan, but may also need to discuss your meds and symptoms with your  psychiatrist as well - let them know about your higher dose of gabapentin. No med changes for now.     Signed,   Merri Ray, MD [place], [facility] 08/26/21 1:30 PM

## 2021-08-26 ENCOUNTER — Encounter: Payer: Self-pay | Admitting: Family Medicine

## 2021-08-26 DIAGNOSIS — M25572 Pain in left ankle and joints of left foot: Secondary | ICD-10-CM | POA: Diagnosis not present

## 2021-09-01 ENCOUNTER — Ambulatory Visit: Payer: Medicare HMO

## 2021-09-03 ENCOUNTER — Encounter: Payer: Medicare HMO | Admitting: Family Medicine

## 2021-09-03 ENCOUNTER — Other Ambulatory Visit: Payer: Self-pay

## 2021-09-03 ENCOUNTER — Telehealth: Payer: Self-pay | Admitting: Family Medicine

## 2021-09-03 DIAGNOSIS — H401112 Primary open-angle glaucoma, right eye, moderate stage: Secondary | ICD-10-CM | POA: Diagnosis not present

## 2021-09-03 DIAGNOSIS — E119 Type 2 diabetes mellitus without complications: Secondary | ICD-10-CM | POA: Diagnosis not present

## 2021-09-03 DIAGNOSIS — B027 Disseminated zoster: Secondary | ICD-10-CM

## 2021-09-03 DIAGNOSIS — H33302 Unspecified retinal break, left eye: Secondary | ICD-10-CM | POA: Diagnosis not present

## 2021-09-03 DIAGNOSIS — H524 Presbyopia: Secondary | ICD-10-CM | POA: Diagnosis not present

## 2021-09-03 LAB — HM DIABETES EYE EXAM

## 2021-09-03 NOTE — Telephone Encounter (Signed)
[place] for referral to neurology for post herpetic pain

## 2021-09-03 NOTE — Telephone Encounter (Signed)
[name] would you like patient to be seen in office first or a referral can be placed. Please advise

## 2021-09-03 NOTE — Telephone Encounter (Signed)
Pt called in stating that she would like a referral to [facility] to talk about the nerve issues she has been having since having the Shingles.  Please advise   Pt can be reached at the home #

## 2021-09-03 NOTE — Telephone Encounter (Signed)
Referral has been placed for patient to Neurology for post herpetic pain.

## 2021-09-05 ENCOUNTER — Telehealth: Payer: Self-pay | Admitting: Family Medicine

## 2021-09-05 NOTE — Telephone Encounter (Signed)
Called patient to check status.  Still intermittent symptoms, sometimes better than others, has follow-up with neurology this Monday.  Discussed that she may have other causes of her symptoms including possible cervical, lumbar spine disease or Chiari malformation contributing to the symptoms..  Possibly may need follow-up with neurosurgery.  Can discuss with neuro on Monday.

## 2021-09-08 ENCOUNTER — Ambulatory Visit: Payer: Medicare HMO | Admitting: Neurology

## 2021-09-08 ENCOUNTER — Other Ambulatory Visit: Payer: Self-pay

## 2021-09-08 VITALS — BP 139/86 | HR 77 | Ht 69.0 in | Wt 222.2 lb

## 2021-09-08 DIAGNOSIS — Z9989 Dependence on other enabling machines and devices: Secondary | ICD-10-CM

## 2021-09-08 DIAGNOSIS — G935 Compression of brain: Secondary | ICD-10-CM

## 2021-09-08 DIAGNOSIS — L918 Other hypertrophic disorders of the skin: Secondary | ICD-10-CM | POA: Diagnosis not present

## 2021-09-08 DIAGNOSIS — L700 Acne vulgaris: Secondary | ICD-10-CM | POA: Diagnosis not present

## 2021-09-08 DIAGNOSIS — G4733 Obstructive sleep apnea (adult) (pediatric): Secondary | ICD-10-CM

## 2021-09-08 DIAGNOSIS — R202 Paresthesia of skin: Secondary | ICD-10-CM

## 2021-09-08 NOTE — Progress Notes (Signed)
Subjective:    Patient ID: [name] is a 65 y.o. female.  HPI    History:   Dear [name],   I saw your patient, [name], upon your kind request, in my neurologic clinic today for evaluation of her pain, concern for post-herpetic neuralgia. The patient is unaccompanied today. I have seen her last year for her sleep apnea. As you know, [name] is a 65 year old right-handed woman with an underlying complex medical history of hypertension, hyperlipidemia, mitral valve prolapse, sleep apnea, allergies, arthritis, asthma, anxiety, depression, with a diagnosis of bipolar disorder, followed by psychiatry, reflux disease, Chiari malformation, and obesity, who reports approximately 63-monthhistory of intermittent pins-and-needles sensation affecting the upper body, not the face, different places including her arms and legs, independently, lasts briefly for a few seconds, is not actually pacing home is annoying.  She reports that she was diagnosed with shingles.  She had a rash that started in early September and was located left distal anterior leg.  She has a little bit of the remnant as an scarring of her skin.  She did not have any blisters.  No widespread vesicular rash.  She reports that she was vaccinated for shingles.  She was treated with Valtrex 1000 mg 3 times daily for a week, she completed the course.  Earlier this month she filled a refill on the Valtrex but has not actually taken it.  She has been on gabapentin per psychiatry at 600 mg 4 times daily but this was increased to a current dose of 1200 mg 3 times daily.   Hydroxyzine was also added at 5 to 10 mg 3 times daily as needed. Of note, per psychiatry, she is currently on Zyprexa 5 mg daily, Seroquel 400 mg at bedtime, clonazepam 2 mg strength 1 pill twice daily, Effexor 75 mg strength 2 pills in the morning and 1 in the afternoon, Lamictal 200 mg twice daily.  She had a cervical spine MRI without contrast as well as lumbar  spine MRI with and without contrast under Dr. PTrenton Gammonon 12/13/2020 with I reviewed the results:   IMPRESSION: 1. Findings consistent with Chiari 1 malformation with crowding of the foramen magnum as seen on MRI of the brain performed December 01, 2020. 2. Multilevel degenerative changes of the cervical spine as described above with mild spinal canal stenosis and severe right neural foraminal narrowing at C5-C6. 3. Postsurgical changes from left laminectomy at L5-S1. No significant peridural fibrosis. 4. No high-grade spinal canal stenosis at any level.   She had a brain MRI with and without contrast on 12/01/2020 and I reviewed the results:   IMPRESSION: 1. Chiari I malformation with greatly progressive cerebellar tonsillar ectopia since 2006. 2. Otherwise unremarkable appearance of the brain for age.   She reports that the increase in gabapentin did not seem to make a difference.  She would rather go back to the 600 mg 4 times daily that she was per psychiatry.  She is not noticing any obvious alleviating or positional factors or aggravating factors.  She is not in constant pain, she denies any numbness.  She has not fallen recently but does report that he twisted her left ankle about 2 months ago and had seen orthopedics for this, she was told that she had a nondisplaced fracture of the left ankle and she was given a brace.  She reports compliance with her CPAP.  I was able to review her CPAP compliance from 08/09/2021 through 09/07/2021, which  is a total of 30 days, during which time she used her machine 28 days with percent use days greater than 4 hours at 83%, indicating very good compliance, average usage of 6 hours and 54 minutes, residual AHI at goal at 3/h, leak on the higher side with a 95th percentile at 25.8 L/min on a pressure of 10 cm with EPR of 3.  Her DME company is [employer].    She reports that she tries to hydrate well but she does have significant mouth dryness.  She reports  that she takes Seroquel 400 mg at bedtime and 50 mg as needed.    She had a home sleep test on 03/11/2020 which showed an AHI of 14.7/h, O2 nadir 86%.  She had a follow-up appointment subsequently with [name], nurse practitioner on 06/11/2020, at which time she was compliant with her CPAP of 11 cm.  Previously:   02/13/20: 66 year old right-handed woman with an underlying medical history of bipolar disorder, obesity, mitral valve prolapse, hyperlipidemia, allergic rhinitis, palpitations, constipation, heart murmur, chronic cough, reflux disease, anxiety, and low back pain status post back surgery in 2001, arthritis, status post left total knee arthroplasty and right total knee arthroplasty, tonsillectomy, and hysterectomy, and status post nasal surgeries twice, who reports Feeling sleepy during the day.  Of note, she is on multiple potentially sedating medications.  She is followed by psychiatry.  In particular, she is on high-dose clonazepam, 2 mg strength, half a pill up to 4 times a day, she is also on Seroquel long-acting and Seroquel immediate release, Effexor Exar high-dose, 225 mg daily, Zyprexa 10 mg strength, Robaxin, Lamictal, high-dose gabapentin 600 mg 4 times a day.  She reports that she gets sleepy while driving.  She has a friend that she likes to visit in [place] and she has felt like she is hypnotized while driving and has veered out of her lane.  She is strongly and repeatedly discouraged from driving while sleepy and to address her medication regimen with her psychiatrist as soon as possible.  In the meantime, I have strongly discouraged her from driving any longer distances, such as to [place].   I have followed her in the past for sleep apnea. She was last seen over 4 years ago.  I reviewed her CPAP compliance data from 01/12/2020 through 02/10/2020 which is a total of 30 days, during which time she used her machine 27 days with percent used days greater than 4 hours at 87%, indicating  very good compliance with an average usage of 8 hours and 28 minutes, residual AHI at goal at 3.8/h, leak on the high side with a 95th percentile at 28.4 L/min on a pressure of 11 cm with EPR of 3.  She reports that the machine is old And she would like to get a new machine.  According to our records, her set up date was 01/28/2015.  She is a non-smoker.  She drinks alcohol rarely.  She drinks caffeine in the form of coffee, 2 cups/day on average.  She gets her supplies from [employer] [facility].  Bedtime is around 11 PM or midnight and rise time around 8.  She denies any night to night nocturia or recurrent morning headaches.  She lives alone, she has a small dog in the household. Her Epworth sleepiness score is 6/24, fatigue severity score is 40/63.     02/10/2016: I reviewed her CPAP compliance data from 12/13/2015 through 01/11/2016 which is a total of 30 days during which  time she used her machine 17 days with percent used days greater than 4 hours at 53%, indicating suboptimal compliance with an average usage of 4 hours and 10 minutes only for all days. Residual AHI 1.9 per hour, leak acceptable with the 95th percentile at 15.2 L/m on a pressure of 11 cm with EPR of 3.   I reviewed her CPAP compliance data from 10/13/2015 through 11/11/2015 which is a total of 30 days during which time she used her machine 21 days with percent used days greater than 4 hours at 63%, indicating suboptimal compliance with an average usage of 4 hours and 36 minutes, residual AHI 3 per hour, leak acceptable with the 95th percentile at 8.5 L/m on a pressure of 11 cm with EPR of 3.   I reviewed her CPAP compliance data from 09/01/2015 through 09/30/2015 which is a total of 30 days during which time she used her machine 23 days with percent used days greater than 4 hours at 67% only, indicating suboptimal compliance with an average usage of 4 hours and 55 minutes, residual AHI borderline at 5.1 per hour, leak acceptable with the 95th  percentile at 10.4 L/m on a pressure of 11 cm with EPR of 3.     She reports that she needs supplies. She moved to a smaller townhome recently and lost her mask and other parts of her headgear. She noticed worsening of her daytime somnolence, mood irritability and memory issues since stopping CPAP for the past month or so. She is motivated to get back on treatment. She admits that she thought she would do okay without treatment. Weight has been fairly stable.    I saw her on saw her on 03/27/2015, at which time she reported feeling better. She was feeling better rested. She did have more stress and was worrying about her brother. She was motivated to try to lose weight and exercise more. Overall, she felt that she was doing better and her morning headaches had also improved as well as her nocturia. She was compliant with treatment.     I first met her on 09/13/2014 at the request of her primary care physician. At which time the patient reported excessive daytime somnolence and snoring. I invited her back for sleep study. She had a baseline sleep study, followed by a CPAP titration study and I went over her test results with her in detail today. Her baseline sleep study from 11/27/2014 showed a sleep efficiency of 77.7%. Sleep latency was prolonged. Wake after sleep onset was 39.5 minutes with mild to moderate sleep fragmentation. She had an increased percentage of stage I and stage II sleep, absence of slow-wave sleep and absence of REM sleep. She had moderate to loud snoring. Total AHI was 5.8 per hour, rising to 15.8 per hour in the supine position. Average oxygen saturation was only 90%. Nadir was 77%. Based on her significant oxygen desaturations I invited her back for CPAP titration study. Of note, the absence of REM sleep with most likely have underestimated her obstructive sleep apnea. Her CPAP titration study from 01/08/2015 showed a sleep efficiency of 93.6%, latency to sleep was 0 minutes and wake  after sleep onset was 33.5 minutes with mild sleep fragmentation noted. She had a normal arousal index. She had a absence of slow-wave sleep and a normal percentage of REM sleep with a long REM latency. She had no significant PLMS. Average oxygen saturation was 92%, nadir was 84%. She did have evidence of central  apneas upon starting with CPAP. CPAP was titrated from 5-11 cm. I prescribed CPAP therapy for home use. I did suggest that she should have a pulse oximetry test while on CPAP once established on treatment.   I reviewed her CPAP compliance data from 02/23/2015 through 03/24/2015 which is a total of 30 days during which time she used her machine 25 days with percent used days greater than 4 hours at 77%, indicating good compliance with an average usage of 6 hours and 11 minutes. Residual AHI acceptable at 3.3 per hour and 95th percentile of leak at 12 L/m on a pressure of 11 cm with EPR of 3.   I reviewed her CPAP compliance data from 02/12/2015 through 03/13/2015 which is a total of 30 days during which time she used her machine only 22 days with percent used days greater than 4 hours at 73%, indicating adequate compliance with an average usage of 5 hours and 41 minutes. Residual AHI acceptable at 3.4 per hour and 95th percentile of leak at 14.7 L/m on a pressure of 11 cm with EPR of 3.   She goes to [facility] for her mood d/o. She is on multiple sedating medications, but has been told she snores. She has snored for 4 years or so. She does not wake up rested. She wakes up with a headache. She denies nocturia. She has no FHx of OSA. She has had intermittent RLS symptoms and twitches her legs in her sleep. She has dozed off driving more than once and veered out of the lane, but thankfully has had no MVA.   She drinks 2 cups of coffee per day. She does not smoke and very rarely drinks alcohol.   She has 2 cats, who occasionally are in the bed, but don't bother her. She does not  watch TV in bed. She mostly sleeps alone. She is divorced and has no children.    Her typical bedtime is reported to be around 10 PM and usual wake time is around 7 AM. Sleep onset typically occurs within minutes. She reports feeling poorly rested upon awakening. She wakes up on an average 3 times in the middle of the night and has to go to the bathroom 0 to 1 times on a typical night.    She reports excessive daytime somnolence (EDS) and Her Epworth Sleepiness Score (ESS) is 9/24 today.   The patient has not been taking a planned nap.   Snoring is reportedly marked, and associated with choking sounds and witnessed apneas. The patient denies a sense of choking or strangling feeling. The patient has noted any RLS symptoms and is known to kick while asleep or before falling asleep. There is no family history of RLS or OSA.   She is a restless sleeper and in the morning, the bed is quite disheveled.    She denies cataplexy, sleep paralysis, hypnagogic or hypnopompic hallucinations, or sleep attacks. She does not report any vivid dreams, nightmares, dream enactments, or parasomnias, such as sleep talking or sleep walking. The patient has not had a sleep study or a home sleep test. Her bedroom is usually dark and cool. She uses a box fan.       Her Past Medical History Is Significant For: Past Medical History:  Diagnosis Date   Allergic rhinitis    Allergy    Anxiety    Arthritis    R knee, Left Knee   Asthma    Bipolar disorder ([place])  Cataract    Complication of anesthesia    had bad anxiety after anesthesia   Constipation, chronic    Depression    Diabetes mellitus without complication (HCC)    Dysrhythmia    Palpations occ, takes Tenormin   GERD (gastroesophageal reflux disease)    Glaucoma    Heart murmur    Hx of adenomatous colonic polyps 2008   Hyperlipidemia    Hypertension    MVP (mitral valve prolapse)    Sleep apnea    wears CPAP   Urinary tract infection    frequent,  see Urololgist 12/16/2010    Her Past Surgical History Is Significant For: Past Surgical History:  Procedure Laterality Date   ABDOMINAL HYSTERECTOMY  2002   no oophorectomy   BACK SURGERY  2001   L5 - S1   CATARACT EXTRACTION     COLONOSCOPY     NASAL SINUS SURGERY  01/2010   x2   TONSILLECTOMY     TOTAL KNEE ARTHROPLASTY  12/23/2011   Procedure: TOTAL KNEE ARTHROPLASTY;  Surgeon: Ninetta Lights, MD;  Location: [facility];  Service: Orthopedics;  Laterality: Right;  DR MURPHY WANTS 120 MINUTES FOR SURGERY   TOTAL KNEE ARTHROPLASTY  08/31/2012   Procedure: TOTAL KNEE ARTHROPLASTY;  Surgeon: Ninetta Lights, MD;  Location: [facility];  Service: Orthopedics;  Laterality: Left;    Her Family History Is Significant For: Family History  Problem Relation Age of Onset   Lung disease Mother        Mycobacterium avium intracellulare   Coronary artery disease Father    Heart attack Father 56   Heart disease Father    Hypertension Brother    Hyperlipidemia Brother    Alcohol abuse Brother    Anxiety disorder Brother    Healthy Brother    Diabetes Maternal Grandmother    Stroke Maternal Grandmother    Stroke Maternal Grandfather    Heart attack Paternal Grandmother    Anesthesia problems Neg Hx    Colon cancer Neg Hx    Esophageal cancer Neg Hx    Rectal cancer Neg Hx    Stomach cancer Neg Hx     Her Social History Is Significant For: Social History   Socioeconomic History   Marital status: Single    Spouse name: Not on file   Number of children: 0   Years of education: 13   Highest education level: Some college, no degree  Occupational History   Occupation: retired//disability    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 No kids. Grew up in [place]. Had a great childhood. Has 2 older brothers.   Mom was a stay at home mom. Her dad worked at [employer] as a Banker. He died when pt was 65yo.    Never abused.       Disabled d/t Bipolar since 1992.   Religion-Christian   No legal issues.    Lives alone. Has 1 dog. Has pet sitting business.   2 cups of  coffee a day.   Social Determinants of Health   Financial Resource Strain: Low Risk    Difficulty of Paying Living Expenses: Not very hard  Food Insecurity: No Food Insecurity   Worried About Charity fundraiser in the Last Year: Never true   Ran Out of Food in the Last Year: Never true  Transportation Needs: No Transportation Needs   Lack of Transportation (Medical): No   Lack of Transportation (Non-Medical): No  Physical Activity: Not on file  Stress: Stress Concern Present   Feeling of Stress : Rather much  Social Connections: Socially Isolated   Frequency of Communication with Friends and Family: Twice a week   Frequency of Social Gatherings with Friends and Family: Never   Attends Religious Services: 1 to 4 times per year   Active Member of [employer] or Organizations: No   Attends Music therapist: Never   Marital Status: Divorced    Her Allergies Are:  Allergies  Allergen Reactions   Nitrofurantoin Monohyd Macro Nausea Only    Severe headache   Statins Other (See Comments)    Severe weakness, pain   Sulfa Antibiotics Nausea And Vomiting   Alprazolam Anxiety and Other (See Comments)    Hyper    Amoxicillin Rash   Prednisone Anxiety  :   Her Current Medications Are:  Outpatient Encounter Medications as of 09/08/2021  Medication Sig   Alirocumab (PRALUENT) 150 MG/ML SOAJ Inject 150 mg into the skin every 14 (fourteen) days.   ALPHAGAN P 0.1 % SOLN Place 1 drop into both eyes every  8 (eight) hours.    azelastine (ASTELIN) 0.1 % nasal spray PLACE 2 SPRAYS INTO EACH NOSTRIL TWO TIMES A DAY AS DIRECTED   budesonide-formoterol (SYMBICORT) 160-4.5 MCG/ACT inhaler Inhale 2 puffs into the lungs in the morning and at bedtime.   Cholecalciferol (VITAMIN D3) 50 MCG (2000 UT) CAPS Take 2,000 Units by mouth daily.   CLARAVIS 20 MG capsule Take 20 mg by mouth daily.   clonazePAM (KLONOPIN) 2 MG tablet 1 mg 2 (two) times daily.    Cyanocobalamin (VITAMIN B-12 PO) Take by mouth.   fenofibrate (TRICOR) 145 MG tablet Take 1 tablet (145 mg total) by mouth daily.   fexofenadine (ALLEGRA) 180 MG tablet Take 1 tablet (180 mg total) by mouth daily.   fluticasone (FLONASE) 50 MCG/ACT nasal spray SPRAY TWO SPRAYS IN EACH NOSTRIL ONCE DAILY   gabapentin (NEURONTIN) 600 MG tablet Take 1,200 mg by mouth 2 (two) times daily.    gabapentin (NEURONTIN) 600 MG tablet Take 2 tablets (1,200 mg total) by mouth 3 (three) times daily.   hydrOXYzine (ATARAX/VISTARIL) 10 MG tablet Take 0.5-1 tablets (5-10 mg total) by mouth 3 (three) times daily as needed for itching.   ISOtretinoin (CLARAVIS PO) Take by mouth daily.   lamoTRIgine (LAMICTAL) 200 MG tablet Take 200 mg by mouth 2 (two) times daily.   LUMIGAN 0.01 % SOLN Place 1 drop into both eyes at bedtime.    meloxicam (MOBIC) 15 MG tablet Take 1 tablet (15 mg total) by mouth daily.   metoprolol succinate (TOPROL-XL) 50 MG 24 hr tablet TAKE ONE TABLET BY MOUTH DAILY WITH A MEAL   montelukast (SINGULAIR) 10 MG tablet Take 1 tablet (10 mg total) by mouth at bedtime.   Multiple Vitamin (MULTIVITAMIN WITH MINERALS) TABS tablet Take 1 tablet by mouth daily.   OLANZapine (ZYPREXA) 5 MG tablet Take 5 mg by mouth daily as needed.   omega-3 acid ethyl  esters (LOVAZA) 1 g capsule Take 2 capsules (2 g total) by mouth 2 (two) times daily.   OVER THE COUNTER MEDICATION Senna tablets- 8.6 mg daily   Pyridoxine HCl (VITAMIN B-6 PO) Take by mouth.   QUEtiapine (SEROQUEL  XR) 300 MG 24 hr tablet Take 2 tablets (600 mg total) by mouth at bedtime. (Patient taking differently: Take 600 mg by mouth at bedtime. Taking 1 400 mg tablet at bedtime, takes 50 mg daily as needed)   triamcinolone cream (KENALOG) 0.1 % Apply 1 application topically 2 (two) times daily.   UNABLE TO FIND CPAP   valACYclovir (VALTREX) 1000 MG tablet TAKE ONE TABLET BY MOUTH THREE TIMES A DAY   venlafaxine (EFFEXOR-XR) 75 MG 24 hr capsule Take 75 mg by mouth daily. 2 po q am, 1 po q afternoon   No facility-administered encounter medications on file as of 09/08/2021.  :  Review of Systems:  Out of a complete 14 point review of systems, all are reviewed and negative with the exception of these symptoms as listed below:  Review of Systems  Neurological:        Pt states she is here for tingling pain all over . Pt states its like pins pricking her . Pt states symptoms started  after she has shingles 2 months ago.    Objective:  Neurological Exam  Physical Exam Physical Examination:   Vitals:   09/08/21 1456  BP: 139/86  Pulse: 77    General Examination: The patient is a very pleasant 65 y.o. female in no acute distress. She appears well-developed and well-nourished and well groomed.   HEENT: Normocephalic, atraumatic, pupils are equal, round and reactive to light, extraocular tracking is well-preserved, hearing grossly intact to tuning fork.  Face is symmetric with normal facial animation and normal facial sensation to light touch, temperature and vibration sense.  Speech is clear with no dysarthria noted. There is no hypophonia. There is no lip, neck/head, jaw or voice tremor. Neck is supple with full range of passive and active motion. There are no carotid bruits on auscultation. Oropharynx exam reveals: severe mouth dryness, adequate dental hygiene and mild airway crowding. Tongue protrudes centrally and palate elevates symmetrically.    Chest: Clear to auscultation without wheezing,  rhonchi or crackles noted.   Heart: S1+S2+0, regular and normal without murmurs, rubs or gallops noted.    Abdomen: Soft, non-tender and non-distended.   Extremities: There is no pitting edema in the distal lower extremities bilaterally.    Skin: Warm and dry without trophic changes noted.  Mild hyperpigmentation, scarring left anterior distal lower extremity around the shin area, from below the left knee to the above ankle area on the left.   Musculoskeletal: exam reveals no obvious joint deformities.    Neurologically:  Mental status: The patient is awake, alert and oriented in all 4 spheres. Her immediate and remote memory, attention, language skills and fund of knowledge are appropriate. There is no evidence of aphasia, agnosia, apraxia or anomia. Speech is clear with normal prosody and enunciation. Thought process is linear. Mood is normal and affect is normal.  Cranial nerves II - XII are as described above under HEENT exam.  Motor exam: Normal bulk, strength and tone is noted. There is no drift, or tremor, or rebound.  Fine motor skills show normal finger taps, normal hand movements, normal rapid alternating patting, normal foot tapping. Cerebellar testing: No dysmetria or intention tremor.  Finger-to-nose and heel-to-shin bilaterally. Sensory exam: intact to  light touch, temperature and vibration sense in the upper and lower extremities bilaterally.  She is currently without any significant pain or paresthesias.  Gait, station and balance: She stands easily. No veering to one side is noted. No leaning to one side is noted. Posture is age-appropriate and stance is narrow based. Gait shows normal stride length and normal pace.  Romberg is negative with the exception of mild swaying after a few seconds.  Tandem walk is child treatment to normal.   Assessment and Plan:    In summary, Latoria Huneycutt is a very pleasant 65 year old female with an underlying medical history of mood disorder,  including bipolar disease obesity, mitral valve prolapse, hyperlipidemia, allergic rhinitis, palpitations, constipation, heart murmur, chronic cough, reflux disease, anxiety, and low back pain status post back surgery in 2001, arthritis, status post left total knee arthroplasty and right total knee arthroplasty, tonsillectomy, and hysterectomy, status post nasal surgeries, obesity, obstructive sleep apnea on CPAP therapy, who presents for evaluation of her paresthesias affecting the upper and lower extremities.  She has had probable shingles affecting the left distal lower extremity a couple of months ago and since then she has had these intermittent pins-and-needles type sensations.  She is advised that this is unusual for postherpetic presentation, she does not have any significant pain which is also interesting.  Of note, she does not have any Lhermitte's phenomenon.  She has a nonfocal examination.  She does have a history of Chiari malformation and her last brain MRI from January 2022 had shown some progression.  She is advised to proceed with another brain MRI.  She had seen neurosurgery and had a cervical spine MRI as well subsequently.  She is on high-dose gabapentin which has not really helped.  I doubt that this is postherpetic neuralgia especially since she does not really describe neuralgic pain.  She is advised that I would not like to make any changes to her medications, unfortunately, given her complex medication list, it is difficult to suggest any alternative symptomatic treatment.  Per psychiatry she has been on gabapentin 600 mg 4 times daily and she would like to go back to it.  She is encouraged to discuss this with her psychiatrist.  She is agreeable to the brain MRI.  She is compliant with her CPAP and commended for her treatment adherence.  For sleep apnea she can follow-up routinely in 1 year.  We will keep her posted as to her MRI results and depending on the findings she may want to make a  follow-up appointment with her neurosurgeon.  I answered all her questions today and she was in agreement with our plan.  Thank you very much for allowing me to participate in the care of this nice patient. If I can be of any further assistance to you please do not hesitate to call me at [phone].   Sincerely,     Star Age, MD, PhD  I spent 45 minutes in total face-to-face time and in reviewing records during pre-charting, more than 50% of which was spent in counseling and coordination of care, reviewing test results, reviewing medications and treatment regimen and/or in discussing or reviewing the diagnosis of OSA, paresthesias, the prognosis and treatment options. Pertinent laboratory and imaging test results that were available during this visit with the patient were reviewed by me and considered in my medical decision making (see chart for details).

## 2021-09-08 NOTE — Patient Instructions (Addendum)
It was nice to see you again today.  I am not sure how to explain your pins-and-needles sensation, it does not sound typical from a shingles rash especially since you had shingles confined to the left lower leg.  Nevertheless, since you have a history of Chiari, I would like to proceed with a repeat brain MRI with and without contrast.  We will have you stop at the lab to make sure your chemistry panel is okay prior to proceeding with a brain MRI with contrast.  You can take your clonazepam in preparation for your MRI but please remember that you may not be able to drive if you take clonazepam or extra Seroquel.  We can have them compare your MRI findings from January 2022, if need be, you may benefit from a follow-up appointment with your neurosurgeon.  Reviewed your CPAP compliance data, you are compliant with treatment, keep up the good work with your CPAP machine.  We will plan a follow-up appointment for your sleep apnea in 1 year from now, you can see one of our nurse practitioners.  We will keep you posted as to your brain MRI results by phone call.  Unfortunately, there is not a whole lot I can offer you for symptomatic relief for your pins-and-needles sensation, as you are already on high-dose gabapentin and take additional high risk medications.

## 2021-09-09 ENCOUNTER — Telehealth: Payer: Self-pay | Admitting: *Deleted

## 2021-09-09 LAB — COMPREHENSIVE METABOLIC PANEL
ALT: 19 IU/L (ref 0–32)
AST: 22 IU/L (ref 0–40)
Albumin/Globulin Ratio: 2.5 — ABNORMAL HIGH (ref 1.2–2.2)
Albumin: 5 g/dL — ABNORMAL HIGH (ref 3.8–4.8)
Alkaline Phosphatase: 37 IU/L — ABNORMAL LOW (ref 44–121)
BUN/Creatinine Ratio: 19 (ref 12–28)
BUN: 14 mg/dL (ref 8–27)
Bilirubin Total: 0.4 mg/dL (ref 0.0–1.2)
CO2: 25 mmol/L (ref 20–29)
Calcium: 10 mg/dL (ref 8.7–10.3)
Chloride: 103 mmol/L (ref 96–106)
Creatinine, Ser: 0.72 mg/dL (ref 0.57–1.00)
Globulin, Total: 2 g/dL (ref 1.5–4.5)
Glucose: 185 mg/dL — ABNORMAL HIGH (ref 70–99)
Potassium: 4.1 mmol/L (ref 3.5–5.2)
Sodium: 145 mmol/L — ABNORMAL HIGH (ref 134–144)
Total Protein: 7 g/dL (ref 6.0–8.5)
eGFR: 93 mL/min/{1.73_m2} (ref >59)

## 2021-09-09 NOTE — Telephone Encounter (Signed)
-----   Message from Star Age, MD sent at 09/09/2021  7:40 AM EDT ----- Patient and advise her that her kidney function is normal and it is okay to proceed with a brain MRI with contrast as planned.  However, she did have milder abnormalities on the chemistry panel including an elevated blood sugar value of 185, sodium was slightly above the normal range at 145, one of the a liver enzymes called alkaline phosphatase was mildly below normal, I do not believe that this has any clinical significance.  I would recommend close follow-up for diabetes management with her primary care.

## 2021-09-09 NOTE — Telephone Encounter (Signed)
Spoke with patient and advised patient of her lab results as noted below by Dr. Rexene Alberts.  Patient verbalized understanding and appreciation for the call.  Her questions were answered.  She requested for the results to be sent over to primary care.  Results sent to [name] so she can follow-up.

## 2021-09-11 ENCOUNTER — Encounter: Payer: Self-pay | Admitting: Family Medicine

## 2021-09-11 ENCOUNTER — Other Ambulatory Visit: Payer: Self-pay

## 2021-09-11 ENCOUNTER — Telehealth (INDEPENDENT_AMBULATORY_CARE_PROVIDER_SITE_OTHER): Payer: Medicare HMO | Admitting: Family Medicine

## 2021-09-11 VITALS — Ht 69.0 in | Wt 220.0 lb

## 2021-09-11 DIAGNOSIS — G935 Compression of brain: Secondary | ICD-10-CM

## 2021-09-11 DIAGNOSIS — E119 Type 2 diabetes mellitus without complications: Secondary | ICD-10-CM

## 2021-09-11 NOTE — Progress Notes (Signed)
Virtual Visit via Video   I connected with patient on 09/11/21 at 11:30 AM EDT by a video enabled telemedicine application and verified that I am speaking with the correct person using two identifiers.  Location patient: Home Location provider: [facility] Office Persons participating in the virtual visit: Patient, Provider, [place] [employer] [place] [facility])  I discussed the limitations of evaluation and management by telemedicine and the availability of in person appointments. The patient expressed understanding and agreed to proceed.  Subjective:   HPI:   Neuro results- pt wants to know if a repeat MRI is necessary.  DM- pt's glucose was 185 on recent neuro labs.  She was not fasting prior to visit.  Was told to f/u w/ PCP.  Has known DM.  Pt admits to poor eating recently.  ROS:   See pertinent positives and negatives per HPI.  Patient Active Problem List   Diagnosis Date Noted   Hx of adenomatous colonic polyps 07/2021   Sleep related hypoxia 12/17/2020   Cervical myelopathy ([place]) 10/16/2020   Elevated blood-pressure reading, without diagnosis of hypertension 10/16/2020   Lumbar radiculopathy 10/16/2020   Positive ANA (antinuclear antibody) 08/14/2020   Chronically dry eyes, bilateral 08/14/2020   Dry mouth 08/14/2020   Allergic rhinitis due to pollen 12/13/2018   Diet-controlled diabetes mellitus ([place]) 02/09/2018   Statin intolerance 11/04/2017   Family history of early CAD 03/10/2017   HTN (hypertension) 02/15/2017   Herniated nucleus pulposus, L5-S1 06/22/2014   Asthma, moderate persistent 10/09/2013   Obesity (BMI 30-39.9) 10/09/2013   Fatigue 12/12/2012   GERD (gastroesophageal reflux disease) 09/02/2012   Vitamin D deficiency 09/15/2010   DEPRESSIVE DISORDER 07/17/2010   RHINITIS 06/14/2009   MUSCLE WEAKNESS (GENERALIZED) 01/03/2009   Hyperlipidemia 10/10/2008   BIPOLAR AFFECTIVE DISORDER 10/10/2008   ADD 10/10/2008    Social History   Tobacco Use   Smoking  status: Never   Smokeless tobacco: Never  Substance Use Topics   Alcohol use: No    Alcohol/week: 0.0 standard drinks    Comment: once a year maybe    Current Outpatient Medications:    Alirocumab (PRALUENT) 150 MG/ML SOAJ, Inject 150 mg into the skin every 14 (fourteen) days., Disp: 6 mL, Rfl: 3   ALPHAGAN P 0.1 % SOLN, Place 1 drop into both eyes every 8 (eight) hours. , Disp: , Rfl:    azelastine (ASTELIN) 0.1 % nasal spray, PLACE 2 SPRAYS INTO EACH NOSTRIL TWO TIMES A DAY AS DIRECTED, Disp: 30 mL, Rfl: 2   budesonide-formoterol (SYMBICORT) 160-4.5 MCG/ACT inhaler, Inhale 2 puffs into the lungs in the morning and at bedtime., Disp: 10.2 each, Rfl: 6   Cholecalciferol (VITAMIN D3) 50 MCG (2000 UT) CAPS, Take 2,000 Units by mouth daily., Disp: , Rfl:    CLARAVIS 20 MG capsule, Take 20 mg by mouth daily., Disp: , Rfl:    clonazePAM (KLONOPIN) 2 MG tablet, 1 mg 2 (two) times daily. , Disp: , Rfl: 0   Cyanocobalamin (VITAMIN B-12 PO), Take by mouth., Disp: , Rfl:    fenofibrate (TRICOR) 145 MG tablet, Take 1 tablet (145 mg total) by mouth daily., Disp: 90 tablet, Rfl: 1   fexofenadine (ALLEGRA) 180 MG tablet, Take 1 tablet (180 mg total) by mouth daily., Disp: 90 tablet, Rfl: 3   fluticasone (FLONASE) 50 MCG/ACT nasal spray, SPRAY TWO SPRAYS IN EACH NOSTRIL ONCE DAILY, Disp: 16 g, Rfl: 3   gabapentin (NEURONTIN) 600 MG tablet, Take 1,200 mg by mouth 2 (two) times daily. ,  Disp: , Rfl:    gabapentin (NEURONTIN) 600 MG tablet, Take 2 tablets (1,200 mg total) by mouth 3 (three) times daily., Disp: 90 tablet, Rfl: 0   hydrOXYzine (ATARAX/VISTARIL) 10 MG tablet, Take 0.5-1 tablets (5-10 mg total) by mouth 3 (three) times daily as needed for itching., Disp: 30 tablet, Rfl: 0   ISOtretinoin (CLARAVIS PO), Take by mouth daily., Disp: , Rfl:    lamoTRIgine (LAMICTAL) 200 MG tablet, Take 200 mg by mouth 2 (two) times daily., Disp: , Rfl:    LUMIGAN 0.01 % SOLN, Place 1 drop into both eyes at bedtime. ,  Disp: , Rfl:    meloxicam (MOBIC) 15 MG tablet, Take 1 tablet (15 mg total) by mouth daily., Disp: 30 tablet, Rfl: 3   metoprolol succinate (TOPROL-XL) 50 MG 24 hr tablet, TAKE ONE TABLET BY MOUTH DAILY WITH A MEAL, Disp: 90 tablet, Rfl: 2   MODERNA COVID-19 VACCINE 100 MCG/0.5ML injection, , Disp: , Rfl:    montelukast (SINGULAIR) 10 MG tablet, Take 1 tablet (10 mg total) by mouth at bedtime., Disp: 90 tablet, Rfl: 1   Multiple Vitamin (MULTIVITAMIN WITH MINERALS) TABS tablet, Take 1 tablet by mouth daily., Disp: , Rfl:    OLANZapine (ZYPREXA) 5 MG tablet, Take 5 mg by mouth daily as needed., Disp: , Rfl:    omega-3 acid ethyl esters (LOVAZA) 1 g capsule, Take 2 capsules (2 g total) by mouth 2 (two) times daily., Disp: 360 capsule, Rfl: 0   OVER THE COUNTER MEDICATION, Senna tablets- 8.6 mg daily, Disp: , Rfl:    Pyridoxine HCl (VITAMIN B-6 PO), Take by mouth., Disp: , Rfl:    QUEtiapine (SEROQUEL XR) 300 MG 24 hr tablet, Take 2 tablets (600 mg total) by mouth at bedtime. (Patient taking differently: Take 600 mg by mouth at bedtime. Taking 1 400 mg tablet at bedtime, takes 50 mg daily as needed), Disp: 60 tablet, Rfl: 1   triamcinolone cream (KENALOG) 0.1 %, Apply 1 application topically 2 (two) times daily., Disp: 30 g, Rfl: 0   UNABLE TO FIND, CPAP, Disp: , Rfl:    valACYclovir (VALTREX) 1000 MG tablet, TAKE ONE TABLET BY MOUTH THREE TIMES A DAY, Disp: 21 tablet, Rfl: 0   venlafaxine (EFFEXOR-XR) 75 MG 24 hr capsule, Take 75 mg by mouth daily. 2 po q am, 1 po q afternoon, Disp: , Rfl:   Allergies  Allergen Reactions   Nitrofurantoin Monohyd Macro Nausea Only    Severe headache   Statins Other (See Comments)    Severe weakness, pain   Sulfa Antibiotics Nausea And Vomiting   Alprazolam Anxiety and Other (See Comments)    Hyper    Amoxicillin Rash   Prednisone Anxiety    Objective:   Ht 5\' 9"  (1.753 m)   Wt 220 lb (99.8 kg) Comment: pt reported  LMP 11/09/2000   BMI 32.49 kg/m   AAOx3, NAD NCAT, EOMI No obvious CN deficits Coloring WNL Pt is able to speak clearly, coherently without shortness of breath or increased work of breathing.  Thought process is linear.  Mood is appropriate.   Assessment and Plan:   Chiari I- pt had notable progression of tonsillar protrusion on MRI done Jan 2022.  Encouraged her to f/u as neuro recommended to assess for any additional change.  She states even w/ open MRI she has panic attacks.  Reports Clonazepam doesn't help.  Encouraged her to reach out to Psych and ask if there was something she could take  prior to procedure.  Pt expressed understanding and is in agreement w/ plan.   DM- ongoing issue for pt.  Sugar was elevated at 185 but pt wasn't fasting and admitted that she hasn't been eating well.  Encouraged her to get back on track w/ low carb diet and regular exercise.  Will follow.   Annye Asa, MD 09/11/2021

## 2021-09-22 ENCOUNTER — Telehealth: Payer: Self-pay

## 2021-09-22 NOTE — Progress Notes (Signed)
    Chronic Care Management Pharmacy Assistant   Name: [name]  MRN: [id number] DOB: [birth year]   Reason for Encounter: [facility] Phone Call   Called patient as she had called and LM for CPP. She had questions about her PAP enrollment for Lumigan and Alphagan. Patient reports Dr Marygrace Drought Ophthalmologist prescribes these for her. I advised her to return her portion and the rest of the PAP to Dr Mellissa Kohut office for them to send in along with prescriptions that would be needed as before. Patient voiced understanding and will call and update me on the outcome once she is notified.   Jobe Gibbon, [place] Pharmacist Assistant  [phone]  Time Spent: 12 minutes

## 2021-09-23 DIAGNOSIS — R69 Illness, unspecified: Secondary | ICD-10-CM | POA: Diagnosis not present

## 2021-09-23 DIAGNOSIS — F3132 Bipolar disorder, current episode depressed, moderate: Secondary | ICD-10-CM | POA: Diagnosis not present

## 2021-09-25 ENCOUNTER — Telehealth: Payer: Self-pay | Admitting: Pharmacist

## 2021-09-25 NOTE — Progress Notes (Addendum)
    Chronic Care Management Pharmacy Assistant   Name: [name]  MRN: [id number] DOB: 04-Feb-1956   Reason for Encounter: PAP renewal for Alphagan / Lumigan eye drops for 0813    PAP application was mailed to patient for both Alphagan and Lumigan eye drops and spoke to patient in detail and helped complete her portion over the phone. She will provide her completed part to Dr Satira Sark (eye dr) to fill out prescribing part and attach prescription and send in for patient. She will call with update once outcome is known with renewal dates is applicable. Spoke to patient I n length and also reminded her to request last refill on 10/09/21 with [facility] for her next 3 month supply while she awaits the outcome of the renewal. She voiced understanding.  Jobe Gibbon, [facility] Clinical Pharmacist Assistant  [phone]

## 2021-10-09 DIAGNOSIS — G4733 Obstructive sleep apnea (adult) (pediatric): Secondary | ICD-10-CM | POA: Diagnosis not present

## 2021-10-13 DIAGNOSIS — L821 Other seborrheic keratosis: Secondary | ICD-10-CM | POA: Diagnosis not present

## 2021-10-13 DIAGNOSIS — L700 Acne vulgaris: Secondary | ICD-10-CM | POA: Diagnosis not present

## 2021-10-14 ENCOUNTER — Other Ambulatory Visit: Payer: Self-pay | Admitting: Interventional Cardiology

## 2021-10-17 ENCOUNTER — Ambulatory Visit (INDEPENDENT_AMBULATORY_CARE_PROVIDER_SITE_OTHER): Payer: Medicare HMO | Admitting: Registered Nurse

## 2021-10-17 ENCOUNTER — Encounter: Payer: Self-pay | Admitting: Registered Nurse

## 2021-10-17 ENCOUNTER — Other Ambulatory Visit: Payer: Self-pay

## 2021-10-17 VITALS — BP 127/77 | HR 67 | Temp 97.9°F | Resp 18 | Ht 69.0 in | Wt 219.2 lb

## 2021-10-17 DIAGNOSIS — R1032 Left lower quadrant pain: Secondary | ICD-10-CM | POA: Diagnosis not present

## 2021-10-17 DIAGNOSIS — M6281 Muscle weakness (generalized): Secondary | ICD-10-CM | POA: Diagnosis not present

## 2021-10-17 DIAGNOSIS — R1031 Right lower quadrant pain: Secondary | ICD-10-CM

## 2021-10-17 MED ORDER — TIZANIDINE HCL 2 MG PO TABS: 2.0000 mg | ORAL_TABLET | Freq: Three times a day (TID) | ORAL | 0 refills | Status: DC | PRN

## 2021-10-17 MED ORDER — TRAMADOL HCL 50 MG PO TABS
50.0000 mg | ORAL_TABLET | Freq: Three times a day (TID) | ORAL | 0 refills | Status: AC | PRN
Start: 2021-10-17 — End: 2021-10-22

## 2021-10-17 MED ORDER — MELOXICAM 15 MG PO TABS: 15.0000 mg | ORAL_TABLET | Freq: Every day | ORAL | 3 refills | Status: DC

## 2021-10-17 NOTE — Progress Notes (Signed)
Established Patient Office Visit  Subjective:  Patient ID: [name], female    DOB: 15-Sep-1956  Age: 65 y.o. MRN: [id number]  CC:  Chief Complaint  Patient presents with   Pain    Patient states she has been having some pain in the groin area on both sides. Patient states she thinks its from lifting heavy stuff or a pulled muscle.    HPI [name] presents for groin pain  Onset around thanksgiving Did some heavy lifting - moving boxes and large bag of dog food. Hurt in crease between thigh and groin Pain with motion and strain No bowel or bladder symptoms.  No abdominal pain No constitutional symptoms.   Past Medical History:  Diagnosis Date   Allergic rhinitis    Allergy    Anxiety    Arthritis    R knee, Left Knee   Asthma    Bipolar disorder ([place])    Cataract    Complication of anesthesia    had bad anxiety after anesthesia   Constipation, chronic    Depression    Diabetes mellitus without complication (HCC)    Dysrhythmia    Palpations occ, takes Tenormin   GERD (gastroesophageal reflux disease)    Glaucoma    Heart murmur    Hx of adenomatous colonic polyps 2008   Hyperlipidemia    Hypertension    MVP (mitral valve prolapse)    Sleep apnea    wears CPAP   Urinary tract infection    frequent, see Urololgist 12/16/2010    Past Surgical History:  Procedure Laterality Date   ABDOMINAL HYSTERECTOMY  2002   no oophorectomy   BACK SURGERY  2001   L5 - S1   CATARACT EXTRACTION     COLONOSCOPY     NASAL SINUS SURGERY  01/2010   x2   TONSILLECTOMY     TOTAL KNEE ARTHROPLASTY  12/23/2011   Procedure: TOTAL KNEE ARTHROPLASTY;  Surgeon: Ninetta Lights, MD;  Location: [facility];  Service: Orthopedics;  Laterality: Right;  DR MURPHY WANTS 120 MINUTES FOR SURGERY   TOTAL KNEE ARTHROPLASTY  08/31/2012   Procedure: TOTAL KNEE ARTHROPLASTY;  Surgeon: Ninetta Lights, MD;  Location: [facility];  Service: Orthopedics;  Laterality: Left;    Family History  Problem  Relation Age of Onset   Lung disease Mother        Mycobacterium avium intracellulare   Coronary artery disease Father    Heart attack Father 67   Heart disease Father    Hypertension Brother    Hyperlipidemia Brother    Alcohol abuse Brother    Anxiety disorder Brother    Healthy Brother    Diabetes Maternal Grandmother    Stroke Maternal Grandmother    Stroke Maternal Grandfather    Heart attack Paternal Grandmother    Anesthesia problems Neg Hx    Colon cancer Neg Hx    Esophageal cancer Neg Hx    Rectal cancer Neg Hx    Stomach cancer Neg Hx     Social History   Socioeconomic History   Marital status: Single    Spouse name: Not on file   Number of children: 0   Years of education: 13   Highest education level: Some college, no degree  Occupational History   Occupation: retired//disability    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 No kids. Grew up in [place]. Had a great childhood. Has 2 older brothers.   Mom was a stay at home mom. Her dad worked at [employer] as a Banker. He died when pt was 65yo.    Never abused.       Disabled d/t Bipolar since 1992.   Religion-Christian   No legal issues.    Lives alone. Has 1 dog. Has pet sitting business.   2 cups of coffee a day.   Social Determinants of Health   Financial Resource Strain: Low Risk    Difficulty of Paying Living Expenses: Not very hard  Food Insecurity: No Food Insecurity   Worried About Charity fundraiser in the Last Year: Never true   Ran Out of Food in the Last Year: Never true   Transportation Needs: No Transportation Needs   Lack of Transportation (Medical): No   Lack of Transportation (Non-Medical): No  Physical Activity: Not on file  Stress: Stress Concern Present   Feeling of Stress : Rather much  Social Connections: Socially Isolated   Frequency of Communication with Friends and Family: Twice a week   Frequency of Social Gatherings with Friends and Family: Never   Attends Religious Services: 1 to 4 times per year   Active Member of [employer] or Organizations: No   Attends Archivist Meetings: Never   Marital Status: Divorced  Human resources officer Violence: Not At Risk   Fear of Current or Ex-Partner: No   Emotionally Abused: No   Physically Abused: No   Sexually Abused: No    Outpatient Medications Prior to Visit  Medication Sig Dispense Refill   Alirocumab (PRALUENT) 150 MG/ML SOAJ Inject 150 mg into the skin every 14 (fourteen) days. 6 mL 3   ALPHAGAN P 0.1 % SOLN Place 1 drop into both eyes every 8 (eight) hours.      azelastine (ASTELIN) 0.1 % nasal spray PLACE 2 SPRAYS INTO EACH NOSTRIL TWO TIMES A DAY AS DIRECTED 30 mL 2   budesonide-formoterol (SYMBICORT) 160-4.5 MCG/ACT inhaler Inhale 2 puffs into the lungs in the morning and at bedtime. 10.2 each 6   Cholecalciferol (VITAMIN D3) 50 MCG (2000 UT) CAPS Take 2,000 Units by mouth daily.     CLARAVIS 20 MG capsule Take 20 mg by mouth daily.     clonazePAM (KLONOPIN) 2 MG tablet 1 mg 2 (two) times daily.   0   Cyanocobalamin (VITAMIN B-12 PO) Take by mouth.     fenofibrate (TRICOR) 145 MG tablet Take 1 tablet (145 mg total) by mouth daily. 90 tablet 1   fexofenadine (ALLEGRA) 180 MG tablet Take 1 tablet (180 mg total) by mouth daily. 90 tablet 3   fluticasone (FLONASE) 50 MCG/ACT nasal spray SPRAY TWO SPRAYS IN EACH NOSTRIL ONCE DAILY 16 g 3   gabapentin (NEURONTIN) 600 MG tablet Take 1,200 mg by mouth 2 (two) times daily.      gabapentin (NEURONTIN) 600 MG tablet Take 2 tablets (1,200 mg total)  by mouth 3 (three) times daily. 90 tablet 0   hydrOXYzine (ATARAX/VISTARIL) 10 MG tablet Take 0.5-1 tablets (5-10  mg total) by mouth 3 (three) times daily as needed for itching. 30 tablet 0   ISOtretinoin (CLARAVIS PO) Take by mouth daily.     lamoTRIgine (LAMICTAL) 200 MG tablet Take 200 mg by mouth 2 (two) times daily.     LUMIGAN 0.01 % SOLN Place 1 drop into both eyes at bedtime.      metoprolol succinate (TOPROL-XL) 50 MG 24 hr tablet TAKE ONE TABLET BY MOUTH DAILY WITH A MEAL 90 tablet 2   MODERNA COVID-19 VACCINE 100 MCG/0.5ML injection      montelukast (SINGULAIR) 10 MG tablet Take 1 tablet (10 mg total) by mouth at bedtime. 90 tablet 1   Multiple Vitamin (MULTIVITAMIN WITH MINERALS) TABS tablet Take 1 tablet by mouth daily.     OLANZapine (ZYPREXA) 5 MG tablet Take 5 mg by mouth daily as needed.     omega-3 acid ethyl esters (LOVAZA) 1 g capsule Take 2 capsules (2 g total) by mouth 2 (two) times daily. Please keep upcoming appt. With Dr. Irish Lack in Jan. In order to receive further refills. Thank You. 120 capsule 0   OVER THE COUNTER MEDICATION Senna tablets- 8.6 mg daily     Pyridoxine HCl (VITAMIN B-6 PO) Take by mouth.     QUEtiapine (SEROQUEL XR) 300 MG 24 hr tablet Take 2 tablets (600 mg total) by mouth at bedtime. (Patient taking differently: Take 600 mg by mouth at bedtime. Taking 1 400 mg tablet at bedtime, takes 50 mg daily as needed) 60 tablet 1   triamcinolone cream (KENALOG) 0.1 % Apply 1 application topically 2 (two) times daily. 30 g 0   UNABLE TO FIND CPAP     valACYclovir (VALTREX) 1000 MG tablet TAKE ONE TABLET BY MOUTH THREE TIMES A DAY 21 tablet 0   venlafaxine (EFFEXOR-XR) 75 MG 24 hr capsule Take 75 mg by mouth daily. 2 po q am, 1 po q afternoon     meloxicam (MOBIC) 15 MG tablet Take 1 tablet (15 mg total) by mouth daily. 30 tablet 3   No facility-administered medications prior to visit.    Allergies  Allergen Reactions   Nitrofurantoin Monohyd Macro Nausea  Only    Severe headache   Statins Other (See Comments)    Severe weakness, pain   Sulfa Antibiotics Nausea And Vomiting   Alprazolam Anxiety and Other (See Comments)    Hyper    Amoxicillin Rash   Prednisone Anxiety    ROS Review of Systems  Constitutional: Negative.   HENT: Negative.    Eyes: Negative.   Respiratory: Negative.    Cardiovascular: Negative.   Gastrointestinal: Negative.   Genitourinary: Negative.   Musculoskeletal: Negative.   Skin: Negative.   Neurological: Negative.   Psychiatric/Behavioral: Negative.    All other systems reviewed and are negative.    Objective:    Physical Exam Vitals and nursing note reviewed.  Constitutional:      General: She is not in acute distress.    Appearance: Normal appearance. She is normal weight. She is not ill-appearing, toxic-appearing or diaphoretic.  Cardiovascular:     Rate and Rhythm: Normal rate and regular rhythm.     Heart sounds: Normal heart sounds. No murmur heard.   No friction rub. No gallop.  Pulmonary:     Effort: Pulmonary effort is normal. No respiratory distress.     Breath sounds: Normal breath sounds. No stridor. No wheezing, rhonchi or rales.  Chest:     Chest wall: No tenderness.  Musculoskeletal:  General: No swelling, tenderness, deformity or signs of injury. Normal range of motion.     Right lower leg: No edema.     Left lower leg: No edema.     Comments: Negative SLR bilat  Skin:    General: Skin is warm and dry.  Neurological:     General: No focal deficit present.     Mental Status: She is alert and oriented to person, place, and time. Mental status is at baseline.  Psychiatric:        Mood and Affect: Mood normal.        Behavior: Behavior normal.        Thought Content: Thought content normal.        Judgment: Judgment normal.    BP 127/77   Pulse 67   Temp 97.9 F (36.6 C) (Temporal)   Resp 18   Ht 5' 9"  (1.753 m)   Wt 219 lb 3.2 oz (99.4 kg)   LMP 11/09/2000    SpO2 99%   BMI 32.37 kg/m  Wt Readings from Last 3 Encounters:  10/17/21 219 lb 3.2 oz (99.4 kg)  09/11/21 220 lb (99.8 kg)  09/08/21 222 lb 3.2 oz (100.8 kg)     Health Maintenance Due  Topic Date Due   Pneumococcal Vaccine 84-31 Years old (2 - PCV) 09/02/2013   COVID-19 Vaccine (5 - Booster for Moderna series) 06/08/2021   HEMOGLOBIN A1C  10/09/2021    There are no preventive care reminders to display for this patient.  Lab Results  Component Value Date   TSH 1.29 04/09/2021   Lab Results  Component Value Date   WBC 4.4 04/09/2021   HGB 12.6 04/09/2021   HCT 36.9 04/09/2021   MCV 88.4 04/09/2021   PLT 220.0 04/09/2021   Lab Results  Component Value Date   NA 145 (H) 09/08/2021   K 4.1 09/08/2021   CO2 25 09/08/2021   GLUCOSE 185 (H) 09/08/2021   BUN 14 09/08/2021   CREATININE 0.72 09/08/2021   BILITOT 0.4 09/08/2021   ALKPHOS 37 (L) 09/08/2021   AST 22 09/08/2021   ALT 19 09/08/2021   PROT 7.0 09/08/2021   ALBUMIN 5.0 (H) 09/08/2021   CALCIUM 10.0 09/08/2021   EGFR 93 09/08/2021   GFR 92.86 04/09/2021   Lab Results  Component Value Date   CHOL 148 04/09/2021   Lab Results  Component Value Date   HDL 66.30 04/09/2021   Lab Results  Component Value Date   LDLCALC 44 04/09/2021   Lab Results  Component Value Date   TRIG 189.0 (H) 04/09/2021   Lab Results  Component Value Date   CHOLHDL 2 04/09/2021   Lab Results  Component Value Date   HGBA1C 7.4 (H) 04/09/2021      Assessment & Plan:   Problem List Items Addressed This Visit       Other   MUSCLE WEAKNESS (GENERALIZED)   Relevant Medications   tiZANidine (ZANAFLEX) 2 MG tablet   meloxicam (MOBIC) 15 MG tablet   traMADol (ULTRAM) 50 MG tablet   Other Visit Diagnoses     Bilateral groin pain    -  Primary       Meds ordered this encounter  Medications   tiZANidine (ZANAFLEX) 2 MG tablet    Sig: Take 1 tablet (2 mg total) by mouth every 8 (eight) hours as needed for muscle  spasms.    Dispense:  60 tablet    Refill:  0  Order Specific Question:   Supervising Provider    Answer:   Carlota Raspberry, JEFFREY R [[id number]]   meloxicam (MOBIC) 15 MG tablet    Sig: Take 1 tablet (15 mg total) by mouth daily.    Dispense:  30 tablet    Refill:  3    Order Specific Question:   Supervising Provider    Answer:   Carlota Raspberry, JEFFREY R [[id number]]   traMADol (ULTRAM) 50 MG tablet    Sig: Take 1 tablet (50 mg total) by mouth every 8 (eight) hours as needed for up to 5 days.    Dispense:  15 tablet    Refill:  0    Order Specific Question:   Supervising Provider    Answer:   Carlota Raspberry, JEFFREY R [[id number]]    Follow-up: Return if symptoms worsen or fail to improve.   PLAN Suspect msk etiology - groin sprain - tizanidine and NSAIDs, tramadol for breakthrough pain. Return if worsening or failing to improve. Can consider PT referral Patient encouraged to call clinic with any questions, comments, or concerns.  Maximiano Coss, NP

## 2021-10-17 NOTE — Patient Instructions (Addendum)
Ms. Eppard -   Doristine Devoid to see you!  I think this is just a muscle strain. Use meloxicam and tylenol as needed, tizanidine for muscle relaxer, and tramadol for breakthrough pain  Tramadol may make you sleepy. Ok to take this before bed.  Call if worsening or no improvement within a week  Thank you  Rich    If you have lab work done today you will be contacted with your lab results within the next 2 weeks.  If you have not heard from [place] then please contact us. The fastest way to get your results is to register for My Chart.   IF you received an x-ray today, you will receive an invoice from [facility]. Please contact [facility] at [phone] with questions or concerns regarding your invoice.   IF you received labwork today, you will receive an invoice from [facility]. Please contact [facility] at [phone] with questions or concerns regarding your invoice.   Our billing staff will not be able to assist you with questions regarding bills from these companies.  You will be contacted with the lab results as soon as they are available. The fastest way to get your results is to activate your My Chart account. Instructions are located on the last page of this paperwork. If you have not heard from [place] regarding the results in 2 weeks, please contact this office.

## 2021-11-06 ENCOUNTER — Ambulatory Visit: Payer: Medicare HMO

## 2021-11-09 DIAGNOSIS — G4733 Obstructive sleep apnea (adult) (pediatric): Secondary | ICD-10-CM | POA: Diagnosis not present

## 2021-11-13 ENCOUNTER — Ambulatory Visit: Payer: Medicare HMO

## 2021-11-17 DIAGNOSIS — D485 Neoplasm of uncertain behavior of skin: Secondary | ICD-10-CM | POA: Diagnosis not present

## 2021-11-17 DIAGNOSIS — L700 Acne vulgaris: Secondary | ICD-10-CM | POA: Diagnosis not present

## 2021-11-17 DIAGNOSIS — B078 Other viral warts: Secondary | ICD-10-CM | POA: Diagnosis not present

## 2021-11-18 ENCOUNTER — Encounter: Payer: Medicare HMO | Admitting: Family Medicine

## 2021-11-27 NOTE — Progress Notes (Signed)
Cardiology Office Note   Date:  11/28/2021   ID:  [name], DOB 06-10-1956, MRN [id number]  PCP:  Midge Minium, MD    Chief Complaint  Patient presents with   Follow-up   Hyperlipidemia  Wt Readings from Last 3 Encounters:  11/28/21 219 lb (99.3 kg)  10/17/21 219 lb 3.2 oz (99.4 kg)  09/11/21 220 lb (99.8 kg)       History of Present Illness: [name] is a 66 y.o. female   has had a h/o MVP for years.  She was treated with atenolol for many years.     She had some higher blood pressures and palpitations.  Her atenolol was switched to metoprolol and HCTZ was started for BP control.    She has had hyperlipidemia treated with a PCSK9 inhibitor. Father with an MI at an early age.   Denies : Chest pain. Dizziness. Leg edema. Nitroglycerin use. Orthopnea. Palpitations. Paroxysmal nocturnal dyspnea. Shortness of breath. Syncope.    Try to get back to walking.  A1c has been high.  Trying to eat high-fiber diet to help bring this down.  Past Medical History:  Diagnosis Date   Allergic rhinitis    Allergy    Anxiety    Arthritis    R knee, Left Knee   Asthma    Bipolar disorder ([place])    Cataract    Complication of anesthesia    had bad anxiety after anesthesia   Constipation, chronic    Depression    Diabetes mellitus without complication (HCC)    Dysrhythmia    Palpations occ, takes Tenormin   GERD (gastroesophageal reflux disease)    Glaucoma    Heart murmur    Hx of adenomatous colonic polyps 2008   Hyperlipidemia    Hypertension    MVP (mitral valve prolapse)    Sleep apnea    wears CPAP   Urinary tract infection    frequent, see Urololgist 12/16/2010    Past Surgical History:  Procedure Laterality Date   ABDOMINAL HYSTERECTOMY  2002   no oophorectomy   BACK SURGERY  2001   L5 - S1   CATARACT EXTRACTION     COLONOSCOPY     NASAL SINUS SURGERY  01/2010   x2   TONSILLECTOMY     TOTAL KNEE ARTHROPLASTY  12/23/2011   Procedure: TOTAL  KNEE ARTHROPLASTY;  Surgeon: Ninetta Lights, MD;  Location: [facility];  Service: Orthopedics;  Laterality: Right;  DR MURPHY WANTS 120 MINUTES FOR SURGERY   TOTAL KNEE ARTHROPLASTY  08/31/2012   Procedure: TOTAL KNEE ARTHROPLASTY;  Surgeon: Ninetta Lights, MD;  Location: [facility];  Service: Orthopedics;  Laterality: Left;     Current Outpatient Medications  Medication Sig Dispense Refill   Alirocumab (PRALUENT) 150 MG/ML SOAJ Inject 150 mg into the skin every 14 (fourteen) days. 6 mL 3   ALPHAGAN P 0.1 % SOLN Place 1 drop into both eyes every 8 (eight) hours.      azelastine (ASTELIN) 0.1 % nasal spray PLACE 2 SPRAYS INTO EACH NOSTRIL TWO TIMES A DAY AS DIRECTED 30 mL 2   budesonide-formoterol (SYMBICORT) 160-4.5 MCG/ACT inhaler Inhale 2 puffs into the lungs in the morning and at bedtime. 10.2 each 6   Cholecalciferol (VITAMIN D3) 50 MCG (2000 UT) CAPS Take 2,000 Units by mouth daily.     CLARAVIS 20 MG capsule Take 20 mg by mouth daily.     clonazePAM (KLONOPIN) 2 MG  tablet 1 mg 2 (two) times daily.   0   Cyanocobalamin (VITAMIN B-12 PO) Take by mouth.     fenofibrate (TRICOR) 145 MG tablet Take 1 tablet (145 mg total) by mouth daily. 90 tablet 1   fexofenadine (ALLEGRA) 180 MG tablet Take 1 tablet (180 mg total) by mouth daily. 90 tablet 3   fluticasone (FLONASE) 50 MCG/ACT nasal spray SPRAY TWO SPRAYS IN EACH NOSTRIL ONCE DAILY 16 g 3   gabapentin (NEURONTIN) 600 MG tablet Take 1,200 mg by mouth 2 (two) times daily.      lamoTRIgine (LAMICTAL) 200 MG tablet Take 200 mg by mouth 2 (two) times daily.     LUMIGAN 0.01 % SOLN Place 1 drop into both eyes at bedtime.      meloxicam (MOBIC) 15 MG tablet Take 1 tablet (15 mg total) by mouth daily. 30 tablet 3   metoprolol succinate (TOPROL-XL) 50 MG 24 hr tablet TAKE ONE TABLET BY MOUTH DAILY WITH A MEAL 90 tablet 2   montelukast (SINGULAIR) 10 MG tablet Take 1 tablet (10 mg total) by mouth at bedtime. 90 tablet 1   Multiple Vitamin (MULTIVITAMIN WITH  MINERALS) TABS tablet Take 1 tablet by mouth daily.     OLANZapine (ZYPREXA) 5 MG tablet Take 5 mg by mouth daily as needed.     omega-3 acid ethyl esters (LOVAZA) 1 g capsule Take 2 capsules (2 g total) by mouth 2 (two) times daily. Please keep upcoming appt. With Dr. Irish Lack in Jan. In order to receive further refills. Thank You. 120 capsule 0   OVER THE COUNTER MEDICATION Senna tablets- 8.6 mg daily     Pyridoxine HCl (VITAMIN B-6 PO) Take 1 tablet by mouth daily.     QUEtiapine (SEROQUEL XR) 300 MG 24 hr tablet Take 2 tablets (600 mg total) by mouth at bedtime. (Patient taking differently: Take 600 mg by mouth at bedtime. Taking 1 400 mg tablet at bedtime, takes 50 mg daily as needed) 60 tablet 1   tiZANidine (ZANAFLEX) 2 MG tablet Take 1 tablet (2 mg total) by mouth every 8 (eight) hours as needed for muscle spasms. 60 tablet 0   triamcinolone cream (KENALOG) 0.1 % Apply 1 application topically 2 (two) times daily. 30 g 0   UNABLE TO FIND CPAP     venlafaxine (EFFEXOR-XR) 75 MG 24 hr capsule Take 75 mg by mouth. Take 2 tabs in the am and 1 tab in afternoonn     No current facility-administered medications for this visit.    Allergies:   Nitrofurantoin monohyd macro, Statins, Alprazolam, Amoxicillin, Prednisone, and Sulfa antibiotics    Social History:  The patient  reports that she has never smoked. She has never used smokeless tobacco. She reports that she does not drink alcohol and does not use drugs.   Family History:  The patient's family history includes Alcohol abuse in her brother; Anxiety disorder in her brother; Coronary artery disease in her father; Diabetes in her maternal grandmother; Healthy in her brother; Heart attack in her paternal grandmother; Heart attack (age of onset: 21) in her father; Heart disease in her father; Hyperlipidemia in her brother; Hypertension in her brother; Lung disease in her mother; Stroke in her maternal grandfather and maternal grandmother.     ROS:  Please see the history of present illness.   Otherwise, review of systems are positive for difficulty losing weight.   All other systems are reviewed and negative.    PHYSICAL EXAM: VS:  BP 110/76    Pulse 82    Ht 5\' 9"  (1.753 m)    Wt 219 lb (99.3 kg)    LMP 11/09/2000    SpO2 94%    BMI 32.34 kg/m  , BMI Body mass index is 32.34 kg/m. GEN: Well nourished, well developed, in no acute distress HEENT: normal Neck: no JVD, carotid bruits, or masses Cardiac: RRR; no murmurs, rubs, or gallops,no edema  Respiratory:  clear to auscultation bilaterally, normal work of breathing GI: soft, nontender, nondistended, + BS MS: no deformity or atrophy Skin: warm and dry, no rash Neuro:  Strength and sensation are intact Psych: euthymic mood, full affect   EKG:   The ekg ordered today demonstrates normal ECG   Recent Labs: 04/09/2021: Hemoglobin 12.6; Platelets 220.0; TSH 1.29 09/08/2021: ALT 19; BUN 14; Creatinine, Ser 0.72; Potassium 4.1; Sodium 145   Lipid Panel    Component Value Date/Time   CHOL 148 04/09/2021 1506   CHOL 163 11/14/2018 1447   CHOL 312 (H) 06/19/2015 0924   TRIG 189.0 (H) 04/09/2021 1506   TRIG 657 (H) 06/19/2015 0924   HDL 66.30 04/09/2021 1506   HDL 71 11/14/2018 1447   HDL 44 06/19/2015 0924   CHOLHDL 2 04/09/2021 1506   VLDL 37.8 04/09/2021 1506   LDLCALC 44 04/09/2021 1506   LDLCALC 66 11/14/2018 1447   LDLCALC Comment 06/19/2015 0924   LDLDIRECT 123.0 01/26/2019 1030     Other studies Reviewed: Additional studies/ records that were reviewed today with results demonstrating: Labs reviewed, LDL 44 in June 2022.   ASSESSMENT AND PLAN:  Hyperlipidemia: Well-controlled on current medications.  Dietary recommendations noted below.  Trying to work on her weight as well. MVP: Not noticed on most recent echocardiogram in 2022.  No CHF symptoms. HTN: The current medical regimen is effective;  continue present plan and medications. FAmily h/o CAD:  No change. DM2: A1c 7.4.  Has been trying to control with diet for many years.  Try to increase exercise.  Whole food, plant-based diet recommended.  Avoid processed foods.   Current medicines are reviewed at length with the patient today.  The patient concerns regarding her medicines were addressed.  The following changes have been made:  No change  Labs/ tests ordered today include:   Orders Placed This Encounter  Procedures   EKG 12-Lead    Recommend 150 minutes/week of aerobic exercise Low fat, low carb, high fiber diet recommended  Disposition:   FU in 1 year   Signed, Larae Grooms, MD  11/28/2021 10:45 AM    [address] [place], [place],   [address] Phone: [phone]; Fax: [phone]

## 2021-11-28 ENCOUNTER — Encounter: Payer: Self-pay | Admitting: Interventional Cardiology

## 2021-11-28 ENCOUNTER — Other Ambulatory Visit: Payer: Self-pay

## 2021-11-28 ENCOUNTER — Ambulatory Visit: Payer: Medicare HMO | Admitting: Interventional Cardiology

## 2021-11-28 VITALS — BP 110/76 | HR 82 | Ht 69.0 in | Wt 219.0 lb

## 2021-11-28 DIAGNOSIS — I10 Essential (primary) hypertension: Secondary | ICD-10-CM

## 2021-11-28 DIAGNOSIS — Z8249 Family history of ischemic heart disease and other diseases of the circulatory system: Secondary | ICD-10-CM | POA: Diagnosis not present

## 2021-11-28 DIAGNOSIS — E669 Obesity, unspecified: Secondary | ICD-10-CM | POA: Diagnosis not present

## 2021-11-28 DIAGNOSIS — I341 Nonrheumatic mitral (valve) prolapse: Secondary | ICD-10-CM | POA: Diagnosis not present

## 2021-11-28 DIAGNOSIS — E782 Mixed hyperlipidemia: Secondary | ICD-10-CM

## 2021-11-28 NOTE — Patient Instructions (Signed)
Medication Instructions:  Your physician recommends that you continue on your current medications as directed. Please refer to the Current Medication list given to you today.  *If you need a refill on your cardiac medications before your next appointment, please call your pharmacy*   Lab Work: none If you have labs (blood work) drawn today and your tests are completely normal, you will receive your results only by: MyChart Message (if you have MyChart) OR A paper copy in the mail If you have any lab test that is abnormal or we need to change your treatment, we will call you to review the results.   Testing/Procedures: none   Follow-Up: At [facility], you and your health needs are our priority.  As part of our continuing mission to provide you with exceptional heart care, we have created designated Provider Care Teams.  These Care Teams include your primary Cardiologist (physician) and Advanced Practice Providers (APPs -  Physician Assistants and Nurse Practitioners) who all work together to provide you with the care you need, when you need it.  We recommend signing up for the patient portal called "MyChart".  Sign up information is provided on this After Visit Summary.  MyChart is used to connect with patients for Virtual Visits (Telemedicine).  Patients are able to view lab/test results, encounter notes, upcoming appointments, etc.  Non-urgent messages can be sent to your provider as well.   To learn more about what you can do with MyChart, go to [email or web address].    Your next appointment:   12 month(s)  The format for your next appointment:   In Person  Provider:   Jayadeep Varanasi, MD     Other Instructions  High-Fiber Eating Plan Fiber, also called dietary fiber, is a type of carbohydrate. It is found foods such as fruits, vegetables, whole grains, and beans. A high-fiber diet can have many health benefits. Your health care provider may recommend a high-fiber diet  to help: Prevent constipation. Fiber can make your bowel movements more regular. Lower your cholesterol. Relieve the following conditions: Inflammation of veins in the anus (hemorrhoids). Inflammation of specific areas of the digestive tract (uncomplicated diverticulosis). A problem of the large intestine, also called the colon, that sometimes causes pain and diarrhea (irritable bowel syndrome, or IBS). Prevent overeating as part of a weight-loss plan. Prevent heart disease, type 2 diabetes, and certain cancers. What are tips for following this plan? Reading food labels  Check the nutrition facts label on food products for the amount of dietary fiber. Choose foods that have 5 grams of fiber or more per serving. The goals for recommended daily fiber intake include: Men (age 50 or younger): 34-38 g. Men (over age 50): 28-34 g. Women (age 50 or younger): 25-28 g. Women (over age 50): 22-25 g. Your daily fiber goal is _____________ g. Shopping Choose whole fruits and vegetables instead of processed forms, such as apple juice or applesauce. Choose a wide variety of high-fiber foods such as avocados, lentils, oats, and kidney beans. Read the nutrition facts label of the foods you choose. Be aware of foods with added fiber. These foods often have high sugar and sodium amounts per serving. Cooking Use whole-grain flour for baking and cooking. Cook with brown rice instead of white rice. Meal planning Start the day with a breakfast that is high in fiber, such as a cereal that contains 5 g of fiber or more per serving. Eat breads and cereals that are made with whole-grain flour instead of   refined flour or white flour. Eat brown rice, bulgur wheat, or millet instead of white rice. Use beans in place of meat in soups, salads, and pasta dishes. Be sure that half of the grains you eat each day are whole grains. General information You can get the recommended daily intake of dietary fiber  by: Eating a variety of fruits, vegetables, grains, nuts, and beans. Taking a fiber supplement if you are not able to take in enough fiber in your diet. It is better to get fiber through food than from a supplement. Gradually increase how much fiber you consume. If you increase your intake of dietary fiber too quickly, you may have bloating, cramping, or gas. Drink plenty of water to help you digest fiber. Choose high-fiber snacks, such as berries, raw vegetables, nuts, and popcorn. What foods should I eat? Fruits Berries. Pears. Apples. Oranges. Avocado. Prunes and raisins. Dried figs. Vegetables Sweet potatoes. Spinach. Kale. Artichokes. Cabbage. Broccoli. Cauliflower. Green peas. Carrots. Squash. Grains Whole-grain breads. Multigrain cereal. Oats and oatmeal. Brown rice. Barley. Bulgur wheat. Millet. Quinoa. Bran muffins. Popcorn. Rye wafer crackers. Meats and other proteins Navy beans, kidney beans, and pinto beans. Soybeans. Split peas. Lentils. Nuts and seeds. Dairy Fiber-fortified yogurt. Beverages Fiber-fortified soy milk. Fiber-fortified orange juice. Other foods Fiber bars. The items listed above may not be a complete list of recommended foods and beverages. Contact a dietitian for more information. What foods should I avoid? Fruits Fruit juice. Cooked, strained fruit. Vegetables Fried potatoes. Canned vegetables. Well-cooked vegetables. Grains White bread. Pasta made with refined flour. White rice. Meats and other proteins Fatty cuts of meat. Fried chicken or fried fish. Dairy Milk. Yogurt. Cream cheese. Sour cream. Fats and oils Butters. Beverages Soft drinks. Other foods Cakes and pastries. The items listed above may not be a complete list of foods and beverages to avoid. Talk with your dietitian about what choices are best for you. Summary Fiber is a type of carbohydrate. It is found in foods such as fruits, vegetables, whole grains, and beans. A high-fiber  diet has many benefits. It can help to prevent constipation, lower blood cholesterol, aid weight loss, and reduce your risk of heart disease, diabetes, and certain cancers. Increase your intake of fiber gradually. Increasing fiber too quickly may cause cramping, bloating, and gas. Drink plenty of water while you increase the amount of fiber you consume. The best sources of fiber include whole fruits and vegetables, whole grains, nuts, seeds, and beans. This information is not intended to replace advice given to you by your health care provider. Make sure you discuss any questions you have with your health care provider. Document Revised: 02/29/2020 Document Reviewed: 02/29/2020 Elsevier Patient Education  2022 Elsevier Inc.   

## 2021-11-28 NOTE — Progress Notes (Signed)
Cardiology Office Note   Date:  11/28/2021   ID:  [name], DOB Jan 11, 1956, MRN [id number]  PCP:  Melanie Minium, MD    Chief Complaint  Patient presents with   Follow-up     Wt Readings from Last 3 Encounters:  11/28/21 219 lb (99.3 kg)  10/17/21 219 lb 3.2 oz (99.4 kg)  09/11/21 220 lb (99.8 kg)       History of Present Illness: [name] is a 66 y.o. female   has had a h/o MVP for years.  She was treated with atenolol for many years.     She had some higher blood pressures and palpitations.  Her atenolol was switched to metoprolol and HCTZ was started for BP control.    She has had hyperlipidemia treated with a PCSK9 inhibitor. Father with an MI at an early age.   2022 echo: "Left ventricular ejection fraction, by estimation, is 60 to 65%. The  left ventricle has normal function. The left ventricle has no regional  wall motion abnormalities. Left ventricular diastolic parameters are  indeterminate.   2. Right ventricular systolic function is normal. The right ventricular  size is normal. There is normal pulmonary artery systolic pressure.   3. The mitral valve is normal in structure. No prolapse noted. Trivial  mitral valve regurgitation.   4. The aortic valve is tricuspid. Aortic valve regurgitation is mild.   5. The inferior vena cava is normal in size with greater than 50%  respiratory variability, suggesting right atrial pressure of 3 mmHg. "  Denies : Chest pain. Dizziness. Leg edema. Nitroglycerin use. Orthopnea. Paroxysmal nocturnal dyspnea. Shortness of breath. Syncope.    Trying to eat healthy and walk.  Wants to lose weight.  Rare palpitations with stress.   Toerating Praluent very well.     Past Medical History:  Diagnosis Date   Allergic rhinitis    Allergy    Anxiety    Arthritis    R knee, Left Knee   Asthma    Bipolar disorder ([place])    Cataract    Complication of anesthesia    had bad anxiety after anesthesia    Constipation, chronic    Depression    Diabetes mellitus without complication (HCC)    Dysrhythmia    Palpations occ, takes Tenormin   GERD (gastroesophageal reflux disease)    Glaucoma    Heart murmur    Hx of adenomatous colonic polyps 2008   Hyperlipidemia    Hypertension    MVP (mitral valve prolapse)    Sleep apnea    wears CPAP   Urinary tract infection    frequent, see Urololgist 12/16/2010    Past Surgical History:  Procedure Laterality Date   ABDOMINAL HYSTERECTOMY  2002   no oophorectomy   BACK SURGERY  2001   L5 - S1   CATARACT EXTRACTION     COLONOSCOPY     NASAL SINUS SURGERY  01/2010   x2   TONSILLECTOMY     TOTAL KNEE ARTHROPLASTY  12/23/2011   Procedure: TOTAL KNEE ARTHROPLASTY;  Surgeon: Melanie Lights, MD;  Location: [facility];  Service: Orthopedics;  Laterality: Right;  DR MURPHY WANTS 120 MINUTES FOR SURGERY   TOTAL KNEE ARTHROPLASTY  08/31/2012   Procedure: TOTAL KNEE ARTHROPLASTY;  Surgeon: Melanie Lights, MD;  Location: [facility];  Service: Orthopedics;  Laterality: Left;     Current Outpatient Medications  Medication Sig Dispense Refill   Alirocumab (PRALUENT)  150 MG/ML SOAJ Inject 150 mg into the skin every 14 (fourteen) days. 6 mL 3   ALPHAGAN P 0.1 % SOLN Place 1 drop into both eyes every 8 (eight) hours.      azelastine (ASTELIN) 0.1 % nasal spray PLACE 2 SPRAYS INTO EACH NOSTRIL TWO TIMES A DAY AS DIRECTED 30 mL 2   budesonide-formoterol (SYMBICORT) 160-4.5 MCG/ACT inhaler Inhale 2 puffs into the lungs in the morning and at bedtime. 10.2 each 6   Cholecalciferol (VITAMIN D3) 50 MCG (2000 UT) CAPS Take 2,000 Units by mouth daily.     CLARAVIS 20 MG capsule Take 20 mg by mouth daily.     clonazePAM (KLONOPIN) 2 MG tablet 1 mg 2 (two) times daily.   0   Cyanocobalamin (VITAMIN B-12 PO) Take by mouth.     fenofibrate (TRICOR) 145 MG tablet Take 1 tablet (145 mg total) by mouth daily. 90 tablet 1   fexofenadine (ALLEGRA) 180 MG tablet Take 1 tablet (180 mg  total) by mouth daily. 90 tablet 3   fluticasone (FLONASE) 50 MCG/ACT nasal spray SPRAY TWO SPRAYS IN EACH NOSTRIL ONCE DAILY 16 g 3   gabapentin (NEURONTIN) 600 MG tablet Take 1,200 mg by mouth 2 (two) times daily.      lamoTRIgine (LAMICTAL) 200 MG tablet Take 200 mg by mouth 2 (two) times daily.     LUMIGAN 0.01 % SOLN Place 1 drop into both eyes at bedtime.      meloxicam (MOBIC) 15 MG tablet Take 1 tablet (15 mg total) by mouth daily. 30 tablet 3   metoprolol succinate (TOPROL-XL) 50 MG 24 hr tablet TAKE ONE TABLET BY MOUTH DAILY WITH A MEAL 90 tablet 2   montelukast (SINGULAIR) 10 MG tablet Take 1 tablet (10 mg total) by mouth at bedtime. 90 tablet 1   Multiple Vitamin (MULTIVITAMIN WITH MINERALS) TABS tablet Take 1 tablet by mouth daily.     OLANZapine (ZYPREXA) 5 MG tablet Take 5 mg by mouth daily as needed.     omega-3 acid ethyl esters (LOVAZA) 1 g capsule Take 2 capsules (2 g total) by mouth 2 (two) times daily. Please keep upcoming appt. With [name] in Jan. In order to receive further refills. Thank You. 120 capsule 0   OVER THE COUNTER MEDICATION Senna tablets- 8.6 mg daily     Pyridoxine HCl (VITAMIN B-6 PO) Take 1 tablet by mouth daily.     QUEtiapine (SEROQUEL XR) 300 MG 24 hr tablet Take 2 tablets (600 mg total) by mouth at bedtime. (Patient taking differently: Take 600 mg by mouth at bedtime. Taking 1 400 mg tablet at bedtime, takes 50 mg daily as needed) 60 tablet 1   tiZANidine (ZANAFLEX) 2 MG tablet Take 1 tablet (2 mg total) by mouth every 8 (eight) hours as needed for muscle spasms. 60 tablet 0   triamcinolone cream (KENALOG) 0.1 % Apply 1 application topically 2 (two) times daily. 30 g 0   UNABLE TO FIND CPAP     venlafaxine (EFFEXOR-XR) 75 MG 24 hr capsule Take 75 mg by mouth. Take 2 tabs in the am and 1 tab in afternoonn     No current facility-administered medications for this visit.    Allergies:   Nitrofurantoin monohyd macro, Statins, Alprazolam, Amoxicillin,  Prednisone, and Sulfa antibiotics    Social History:  The patient  reports that she has never smoked. She has never used smokeless tobacco. She reports that she does not drink alcohol and does  not use drugs.   Family History:  The patient's family history includes Alcohol abuse in her brother; Anxiety disorder in her brother; Coronary artery disease in her father; Diabetes in her maternal grandmother; Healthy in her brother; Heart attack in her paternal grandmother; Heart attack (age of onset: 57) in her father; Heart disease in her father; Hyperlipidemia in her brother; Hypertension in her brother; Lung disease in her mother; Stroke in her maternal grandfather and maternal grandmother.    ROS:  Please see the history of present illness.   Otherwise, review of systems are positive for difficulty losing weight.   All other systems are reviewed and negative.    PHYSICAL EXAM: VS:  BP 110/76    Pulse 82    Ht 5\' 9"  (1.753 m)    Wt 219 lb (99.3 kg)    LMP 11/09/2000    SpO2 94%    BMI 32.34 kg/m  , BMI Body mass index is 32.34 kg/m. GEN: Well nourished, well developed, in no acute distress HEENT: normal Neck: no JVD, carotid bruits, or masses Cardiac: RRR; no murmurs, rubs, or gallops,no edema  Respiratory:  clear to auscultation bilaterally, normal work of breathing GI: soft, nontender, nondistended, + BS, mild abdominal obesity MS: no deformity or atrophy Skin: warm and dry, no rash Neuro:  Strength and sensation are intact Psych: euthymic mood, full affect   EKG:   The ekg ordered today demonstrates normal ECG   Recent Labs: 04/09/2021: Hemoglobin 12.6; Platelets 220.0; TSH 1.29 09/08/2021: ALT 19; BUN 14; Creatinine, Ser 0.72; Potassium 4.1; Sodium 145   Lipid Panel    Component Value Date/Time   CHOL 148 04/09/2021 1506   CHOL 163 11/14/2018 1447   CHOL 312 (H) 06/19/2015 0924   TRIG 189.0 (H) 04/09/2021 1506   TRIG 657 (H) 06/19/2015 0924   HDL 66.30 04/09/2021 1506   HDL  71 11/14/2018 1447   HDL 44 06/19/2015 0924   CHOLHDL 2 04/09/2021 1506   VLDL 37.8 04/09/2021 1506   LDLCALC 44 04/09/2021 1506   LDLCALC 66 11/14/2018 1447   LDLCALC Comment 06/19/2015 0924   LDLDIRECT 123.0 01/26/2019 1030     Other studies Reviewed: Additional studies/ records that were reviewed today with results demonstrating: Labs reviewed.   ASSESSMENT AND PLAN:  Hyperlipidemia: LDL 44,T HDL 66, Triglycerides 189. MVP: Noted in the past.  Not seen on the most recent echo.  No CHF symptoms HTN: The current medical regimen is effective;  continue present plan and medications. FAmily h/o CAD:No change DM2: Trying to control with diet for past 3 years.  A1C 7.4 in June 2022.  Not on meds as of yet.  Avoid processed food, high fiber diet, whole food, plant-based diet, regular exercise.   Current medicines are reviewed at length with the patient today.  The patient concerns regarding her medicines were addressed.  The following changes have been made:  No change  Labs/ tests ordered today include:  No orders of the defined types were placed in this encounter.   Recommend 150 minutes/week of aerobic exercise Low fat, low carb, high fiber diet recommended  Disposition:   FU in 1 year   Signed, Larae Grooms, MD  11/28/2021 10:06 AM    [facility] [address], [place], [place]  [address] Phone: [phone]; Fax: [phone]

## 2021-12-04 DIAGNOSIS — R69 Illness, unspecified: Secondary | ICD-10-CM | POA: Diagnosis not present

## 2021-12-04 DIAGNOSIS — F3132 Bipolar disorder, current episode depressed, moderate: Secondary | ICD-10-CM | POA: Diagnosis not present

## 2021-12-10 DIAGNOSIS — G4733 Obstructive sleep apnea (adult) (pediatric): Secondary | ICD-10-CM | POA: Diagnosis not present

## 2021-12-16 ENCOUNTER — Ambulatory Visit (INDEPENDENT_AMBULATORY_CARE_PROVIDER_SITE_OTHER): Payer: Medicare HMO | Admitting: Family Medicine

## 2021-12-16 ENCOUNTER — Encounter: Payer: Self-pay | Admitting: Family Medicine

## 2021-12-16 VITALS — BP 126/80 | HR 68 | Temp 98.1°F | Resp 16 | Wt 221.2 lb

## 2021-12-16 DIAGNOSIS — E119 Type 2 diabetes mellitus without complications: Secondary | ICD-10-CM

## 2021-12-16 LAB — CBC WITH DIFFERENTIAL/PLATELET
Basophils Absolute: 0 10*3/uL (ref 0.0–0.1)
Basophils Relative: 0.4 % (ref 0.0–3.0)
Eosinophils Absolute: 0 10*3/uL (ref 0.0–0.7)
Eosinophils Relative: 0.1 % (ref 0.0–5.0)
HCT: 36.4 % (ref 36.0–46.0)
Hemoglobin: 12.5 g/dL (ref 12.0–15.0)
Lymphocytes Relative: 36.1 % (ref 12.0–46.0)
Lymphs Abs: 1.5 10*3/uL (ref 0.7–4.0)
MCHC: 34.4 g/dL (ref 30.0–36.0)
MCV: 87.5 fl (ref 78.0–100.0)
Monocytes Absolute: 0.3 10*3/uL (ref 0.1–1.0)
Monocytes Relative: 6.3 % (ref 3.0–12.0)
Neutro Abs: 2.4 10*3/uL (ref 1.4–7.7)
Neutrophils Relative %: 57.1 % (ref 43.0–77.0)
Platelets: 232 10*3/uL (ref 150.0–400.0)
RBC: 4.16 Mil/uL (ref 3.87–5.11)
RDW: 13.5 % (ref 11.5–15.5)
WBC: 4.2 10*3/uL (ref 4.0–10.5)

## 2021-12-16 LAB — HEMOGLOBIN A1C: Hgb A1c MFr Bld: 7.8 % — ABNORMAL HIGH (ref 4.6–6.5)

## 2021-12-16 LAB — HEPATIC FUNCTION PANEL
ALT: 17 U/L (ref 0–35)
AST: 19 U/L (ref 0–37)
Albumin: 4.7 g/dL (ref 3.5–5.2)
Alkaline Phosphatase: 30 U/L — ABNORMAL LOW (ref 39–117)
Bilirubin, Direct: 0.1 mg/dL (ref 0.0–0.3)
Total Bilirubin: 0.7 mg/dL (ref 0.2–1.2)
Total Protein: 6.8 g/dL (ref 6.0–8.3)

## 2021-12-16 LAB — LIPID PANEL
Cholesterol: 158 mg/dL (ref 0–200)
HDL: 58.5 mg/dL (ref >39.00)
NonHDL: 99.55
Total CHOL/HDL Ratio: 3
Triglycerides: 261 mg/dL — ABNORMAL HIGH (ref 0.0–149.0)
VLDL: 52.2 mg/dL — ABNORMAL HIGH (ref 0.0–40.0)

## 2021-12-16 LAB — BASIC METABOLIC PANEL
BUN: 13 mg/dL (ref 6–23)
CO2: 26 mEq/L (ref 19–32)
Calcium: 9.9 mg/dL (ref 8.4–10.5)
Chloride: 102 mEq/L (ref 96–112)
Creatinine, Ser: 0.64 mg/dL (ref 0.40–1.20)
GFR: 93.45 mL/min (ref >60.00)
Glucose, Bld: 96 mg/dL (ref 70–99)
Potassium: 3.8 mEq/L (ref 3.5–5.1)
Sodium: 137 mEq/L (ref 135–145)

## 2021-12-16 LAB — LDL CHOLESTEROL, DIRECT: Direct LDL: 74 mg/dL

## 2021-12-16 LAB — TSH: TSH: 1.01 u[IU]/mL (ref 0.35–5.50)

## 2021-12-16 NOTE — Assessment & Plan Note (Signed)
Chronic problem.  Overdue for A1C.  Pt is concerned bc she admits to eating poorly over the holidays.  UTD on eye exam, foot exam, microalbumin.  Encouraged low carb diet and regular exercise.  Check labs.  Start meds if needed.

## 2021-12-16 NOTE — Progress Notes (Signed)
° °  Subjective:    Patient ID: [name], female    DOB: 1956-05-26, 66 y.o.   MRN: [id number]  HPI DM- last A1C was 7.4%.  UTD on foot exam, microalbumin, and eye exam.  Pt reports feeling ok but she was concerned about the possibility of a high A1C due to holiday eating.  Denies excessive hunger.  No excessive thirst.  Denies excessive urination.  No unexplained weight loss.  No CP, SOB, HAs, visual changes, edema.  No numbness/tingling of hands/feet   Review of Systems For ROS see HPI   This visit occurred during the SARS-CoV-2 public health emergency.  Safety protocols were in place, including screening questions prior to the visit, additional usage of staff PPE, and extensive cleaning of exam room while observing appropriate contact time as indicated for disinfecting solutions.      Objective:   Physical Exam Vitals reviewed.  Constitutional:      General: She is not in acute distress.    Appearance: Normal appearance. She is well-developed. She is obese. She is not ill-appearing.  HENT:     Head: Normocephalic and atraumatic.  Eyes:     Conjunctiva/sclera: Conjunctivae normal.     Pupils: Pupils are equal, round, and reactive to light.  Neck:     Thyroid: No thyromegaly.  Cardiovascular:     Rate and Rhythm: Normal rate and regular rhythm.     Pulses: Normal pulses.     Heart sounds: Normal heart sounds. No murmur heard. Pulmonary:     Effort: Pulmonary effort is normal. No respiratory distress.     Breath sounds: Normal breath sounds.  Abdominal:     General: There is no distension.     Palpations: Abdomen is soft.     Tenderness: There is no abdominal tenderness.  Musculoskeletal:     Cervical back: Normal range of motion and neck supple.     Right lower leg: No edema.     Left lower leg: No edema.  Lymphadenopathy:     Cervical: No cervical adenopathy.  Skin:    General: Skin is warm and dry.  Neurological:     Mental Status: She is alert and oriented to  person, place, and time.  Psychiatric:        Behavior: Behavior normal.          Assessment & Plan:

## 2021-12-16 NOTE — Patient Instructions (Signed)
Follow up as scheduled We'll notify you of your lab results and make any changes if needed Keep up the good work on healthy diet and regular exercise- you can do it! Call with any questions or concerns Stay Safe!  Stay Healthy!! Happy Valentine's Day!!!

## 2021-12-17 ENCOUNTER — Telehealth: Payer: Self-pay

## 2021-12-17 NOTE — Telephone Encounter (Signed)
-----   Message from Midge Minium, MD sent at 12/17/2021  7:20 AM EST ----- A1C has increased from 7.4 --> 7.8%  Thankfully this is manageable w/ low carb diet and regular exercise.  We don't need to make medicine changes at this time.  Remainder of labs look good!

## 2021-12-17 NOTE — Telephone Encounter (Signed)
Pt is aware.  

## 2021-12-23 DIAGNOSIS — L700 Acne vulgaris: Secondary | ICD-10-CM | POA: Diagnosis not present

## 2021-12-30 ENCOUNTER — Encounter: Payer: Medicare HMO | Admitting: Family Medicine

## 2021-12-31 ENCOUNTER — Ambulatory Visit (INDEPENDENT_AMBULATORY_CARE_PROVIDER_SITE_OTHER): Payer: Medicare HMO | Admitting: Family Medicine

## 2021-12-31 ENCOUNTER — Encounter: Payer: Self-pay | Admitting: Family Medicine

## 2021-12-31 VITALS — BP 118/84 | HR 75 | Temp 97.7°F | Resp 16 | Ht 69.0 in | Wt 216.2 lb

## 2021-12-31 DIAGNOSIS — R69 Illness, unspecified: Secondary | ICD-10-CM | POA: Diagnosis not present

## 2021-12-31 DIAGNOSIS — Z0000 Encounter for general adult medical examination without abnormal findings: Secondary | ICD-10-CM | POA: Diagnosis not present

## 2021-12-31 DIAGNOSIS — F3131 Bipolar disorder, current episode depressed, mild: Secondary | ICD-10-CM | POA: Diagnosis not present

## 2021-12-31 NOTE — Progress Notes (Signed)
Subjective:    Patient ID: [name], female    DOB: February 27, 1956, 66 y.o.   MRN: [id number]  HPI CPE- UTD on mammo, colonoscopy, immunizations.  Pt reports feeling good.  Exercising regularly.  Patient Care Team    Relationship Specialty Notifications Start End  Midge Minium, MD PCP - General   10/22/10    Comment: Merged (Merged)  Jettie Booze, MD PCP - Cardiology Cardiology  11/28/21   Midge Minium, MD    10/22/10    Comment: Blenda Mounts, Lemmie Evens, MD Consulting Physician Gynecology  07/28/17   Jettie Booze, MD Consulting Physician Cardiology  07/28/17   Leta Baptist, MD Consulting Physician Otolaryngology  07/28/17   Brayton Caves, MD Referring Physician Psychiatry  07/28/17   Janeth Rase, NP Nurse Practitioner Adult Health Nurse Practitioner  07/28/17   Darlyne Russian, LCSW Social Worker Licensed Clinical Social Worker  07/28/17   Star Age, MD Attending Physician Neurology  08/03/18   [name], [facility] Pharmacist Pharmacist  02/14/20    Comment: phone number [phone]     Health Maintenance  Topic Date Due   COVID-19 Vaccine (5 - Booster for Moderna series) 06/08/2021   HIV Screening  08/20/2022 (Originally 08/13/1971)   FOOT EXAM  04/09/2022   URINE MICROALBUMIN  04/09/2022   HEMOGLOBIN A1C  06/15/2022   TETANUS/TDAP  07/15/2022   OPHTHALMOLOGY EXAM  09/03/2022   MAMMOGRAM  04/29/2023   COLONOSCOPY (Pts 45-73yrs Insurance coverage will need to be confirmed)  07/16/2024   INFLUENZA VACCINE  Completed   Hepatitis C Screening  Completed   Zoster Vaccines- Shingrix  Completed   HPV VACCINES  Aged Out      Review of Systems Patient reports no vision/ hearing changes, adenopathy,fever, weight change,  persistant/recurrent hoarseness , swallowing issues, chest pain, palpitations, edema, persistant/recurrent cough, hemoptysis, dyspnea (rest/exertional/paroxysmal nocturnal), gastrointestinal bleeding (melena, rectal bleeding), abdominal pain,  significant heartburn, bowel changes, GU symptoms (dysuria, hematuria, incontinence), Gyn symptoms (abnormal  bleeding, pain),  syncope, focal weakness, memory loss, numbness & tingling, skin/hair/nail changes, abnormal bruising or bleeding, anxiety, or depression.   This visit occurred during the SARS-CoV-2 public health emergency.  Safety protocols were in place, including screening questions prior to the visit, additional usage of staff PPE, and extensive cleaning of exam room while observing appropriate contact time as indicated for disinfecting solutions.      Objective:   Physical Exam General Appearance:    Alert, cooperative, no distress, appears stated age  Head:    Normocephalic, without obvious abnormality, atraumatic  Eyes:    PERRL, conjunctiva/corneas clear, EOM's intact, fundi    benign, both eyes  Ears:    Normal TM's and external ear canals, both ears  Nose:   Deferred due to COVID  Throat:   Neck:   Supple, symmetrical, trachea midline, no adenopathy;    Thyroid: no enlargement/tenderness/nodules  Back:     Symmetric, no curvature, ROM normal, no CVA tenderness  Lungs:     Clear to auscultation bilaterally, respirations unlabored  Chest Wall:    No tenderness or deformity   Heart:    Regular rate and rhythm, S1 and S2 normal, no murmur, rub   or gallop  Breast Exam:    Deferred to GYN  Abdomen:     Soft, non-tender, bowel sounds active all four quadrants,    no masses, no organomegaly  Genitalia:    Deferred to GYN  Rectal:    Extremities:  Extremities normal, atraumatic, no cyanosis or edema  Pulses:   2+ and symmetric all extremities  Skin:   Skin color, texture, turgor normal, no rashes or lesions  Lymph nodes:   Cervical, supraclavicular, and axillary nodes normal  Neurologic:   CNII-XII intact, normal strength, sensation and reflexes    throughout          Assessment & Plan:

## 2021-12-31 NOTE — Assessment & Plan Note (Signed)
Follows w/ psych.  Currently well controlled.

## 2021-12-31 NOTE — Patient Instructions (Addendum)
Follow up in 3-4 months to recheck diabetes No need to repeat labs at this time b/c they looked great!!! Keep up the good work on healthy diet and regular exercise- you're doing great! Call with any questions or concerns Stay Safe!  Stay Healthy! Happy Spring!!!

## 2021-12-31 NOTE — Assessment & Plan Note (Signed)
Pt's PE WNL w/ exception of obesity.  UTD on mammo, colonoscopy, immunizations.  Reviewed recent labs- look good, no need to repeat.  Anticipatory guidance provided.

## 2022-01-07 ENCOUNTER — Ambulatory Visit: Payer: Medicare HMO | Admitting: Adult Health

## 2022-01-07 DIAGNOSIS — G4733 Obstructive sleep apnea (adult) (pediatric): Secondary | ICD-10-CM | POA: Diagnosis not present

## 2022-01-08 ENCOUNTER — Telehealth: Payer: Self-pay | Admitting: *Deleted

## 2022-01-08 NOTE — Chronic Care Management (AMB) (Signed)
?  Chronic Care Management  ? ?Outreach Note ? ?01/08/2022 ?Name: [name] MRN: [id number] DOB: 1956/06/18 ? ?[name] is a 66 y.o. year old female who is a primary care patient of Birdie Riddle, Aundra Millet, MD. I reached out to [employer] by phone today in response to a referral sent by Ms. [name]'s primary care provider. ? ?An unsuccessful telephone outreach was attempted today. The patient was referred to the case management team for assistance with care management and care coordination.  ? ?Follow Up Plan: A HIPAA compliant phone message was left for the patient providing contact information and requesting a return call.  ?The care management team will reach out to the patient again over the next 7 days.  ?If patient returns call to provider office, please advise to call [facility]* at [phone].* ? ?[name]  ?Care Guide, Embedded Care Coordination ?[place]  Care Management  ?Direct Dial: [phone] ? ?

## 2022-01-09 ENCOUNTER — Other Ambulatory Visit: Payer: Self-pay | Admitting: Interventional Cardiology

## 2022-01-09 ENCOUNTER — Telehealth: Payer: Self-pay

## 2022-01-09 NOTE — Telephone Encounter (Signed)
Please call regarding Praluent prescription renewal.  Thank you ?

## 2022-01-12 NOTE — Telephone Encounter (Signed)
LMOM PT TO CALL BACK ASAP  ?

## 2022-01-12 NOTE — Telephone Encounter (Signed)
Pt returned call and we re enrolled for healthwell see below: ?PATIENT ?[name] ?  ?STATUS  ?Active ?  ?START DATE ?12/13/2021 ?  ?END DATE ?12/12/2022 ?  ?ASSISTANCE TYPE ?Co-pay ?  ?PAID ?$0.00 ?  ?PENDING ?$0.00 ?  ?BALANCE ?$2500.00 ?Pharmacy Card ?CARD NO. ?[id number] ?  ?CARD STATUS ?Active ?  ?BIN ?[id number] ?  ?PCN ?[id number] ?  ?PC GROUP ?[id number] ?  ?HELP DESK ?[phone] ?  ?PROVIDER ?PDMI ?  ?PROCESSOR ?PDMI ?

## 2022-01-15 NOTE — Chronic Care Management (AMB) (Signed)
?  Chronic Care Management  ? ?Outreach Note ? ?01/15/2022 ?Name: [name] MRN: [id number] DOB: Jan 30, 1956 ? ?[name] is a 66 y.o. year old female who is a primary care patient of Birdie Riddle, Aundra Millet, MD. I reached out to [employer] by phone today in response to a referral sent by Ms. [name]'s primary care provider. ? ?A second unsuccessful telephone outreach was attempted today. The patient was referred to the case management team for assistance with care management and care coordination.  ? ?Follow Up Plan: A HIPAA compliant phone message was left for the patient providing contact information and requesting a return call.  ?The care management team will reach out to the patient again over the next 7 days.  ?If patient returns call to provider office, please advise to call [place] * at [phone].* ? ?Laverda Sorenson  ?Care Guide, Embedded Care Coordination ?[place]  Care Management  ?Direct Dial: [phone] ? ?

## 2022-01-16 NOTE — Chronic Care Management (AMB) (Signed)
?  Chronic Care Management  ? ?Note ? ?01/16/2022 ?Name: [name] MRN: [id number] DOB: [birth year] ? ?[name] is a 66 y.o. year old female who is a primary care patient of Birdie Riddle, Aundra Millet, MD. I reached out to [employer] by phone today in response to a referral sent by [name]'s PCP. ? ?[name] was given information about Chronic Care Management services today including:  ?CCM service includes personalized support from designated clinical staff supervised by her physician, including individualized plan of care and coordination with other care providers ?24/7 contact phone numbers for assistance for urgent and routine care needs. ?Service will only be billed when office clinical staff spend 20 minutes or more in a month to coordinate care. ?Only one practitioner may furnish and bill the service in a calendar month. ?The patient may stop CCM services at any time (effective at the end of the month) by phone call to the office staff. ?The patient is responsible for co-pay (up to 20% after annual deductible is met) if co-pay is required by the individual health plan.  ? ?Patient agreed to services and verbal consent obtained.  ? ?Follow up plan: ?Telephone appointment with care management team member scheduled for:02/18/22 ? ?[name]  ?Care Guide, Embedded Care Coordination ?[place]  Care Management  ?Direct Dial: [phone] ? ?

## 2022-01-21 DIAGNOSIS — F3132 Bipolar disorder, current episode depressed, moderate: Secondary | ICD-10-CM | POA: Diagnosis not present

## 2022-01-21 DIAGNOSIS — R69 Illness, unspecified: Secondary | ICD-10-CM | POA: Diagnosis not present

## 2022-01-27 DIAGNOSIS — H401112 Primary open-angle glaucoma, right eye, moderate stage: Secondary | ICD-10-CM | POA: Diagnosis not present

## 2022-01-29 DIAGNOSIS — L700 Acne vulgaris: Secondary | ICD-10-CM | POA: Diagnosis not present

## 2022-02-04 ENCOUNTER — Other Ambulatory Visit: Payer: Self-pay | Admitting: Interventional Cardiology

## 2022-02-07 DIAGNOSIS — G4733 Obstructive sleep apnea (adult) (pediatric): Secondary | ICD-10-CM | POA: Diagnosis not present

## 2022-02-09 ENCOUNTER — Other Ambulatory Visit: Payer: Self-pay

## 2022-02-09 DIAGNOSIS — E782 Mixed hyperlipidemia: Secondary | ICD-10-CM

## 2022-02-09 MED ORDER — FENOFIBRATE 145 MG PO TABS: 145.0000 mg | ORAL_TABLET | Freq: Every day | ORAL | 1 refills | Status: DC

## 2022-02-12 ENCOUNTER — Encounter: Payer: Self-pay | Admitting: Family Medicine

## 2022-02-12 ENCOUNTER — Ambulatory Visit (INDEPENDENT_AMBULATORY_CARE_PROVIDER_SITE_OTHER): Payer: Medicare HMO | Admitting: Family Medicine

## 2022-02-12 VITALS — BP 128/80 | HR 74 | Temp 97.9°F | Resp 16 | Wt 220.8 lb

## 2022-02-12 DIAGNOSIS — R5383 Other fatigue: Secondary | ICD-10-CM

## 2022-02-12 DIAGNOSIS — Z6832 Body mass index (BMI) 32.0-32.9, adult: Secondary | ICD-10-CM | POA: Diagnosis not present

## 2022-02-12 LAB — CBC WITH DIFFERENTIAL/PLATELET
Basophils Absolute: 0 10*3/uL (ref 0.0–0.1)
Basophils Relative: 0.6 % (ref 0.0–3.0)
Eosinophils Absolute: 0 10*3/uL (ref 0.0–0.7)
Eosinophils Relative: 0 % (ref 0.0–5.0)
HCT: 38.4 % (ref 36.0–46.0)
Hemoglobin: 13.1 g/dL (ref 12.0–15.0)
Lymphocytes Relative: 34.4 % (ref 12.0–46.0)
Lymphs Abs: 1.3 10*3/uL (ref 0.7–4.0)
MCHC: 34.2 g/dL (ref 30.0–36.0)
MCV: 89.5 fl (ref 78.0–100.0)
Monocytes Absolute: 0.2 10*3/uL (ref 0.1–1.0)
Monocytes Relative: 6.1 % (ref 3.0–12.0)
Neutro Abs: 2.2 10*3/uL (ref 1.4–7.7)
Neutrophils Relative %: 58.9 % (ref 43.0–77.0)
Platelets: 209 10*3/uL (ref 150.0–400.0)
RBC: 4.29 Mil/uL (ref 3.87–5.11)
RDW: 14 % (ref 11.5–15.5)
WBC: 3.7 10*3/uL — ABNORMAL LOW (ref 4.0–10.5)

## 2022-02-12 LAB — BASIC METABOLIC PANEL
BUN: 15 mg/dL (ref 6–23)
CO2: 28 mEq/L (ref 19–32)
Calcium: 10.4 mg/dL (ref 8.4–10.5)
Chloride: 101 mEq/L (ref 96–112)
Creatinine, Ser: 0.66 mg/dL (ref 0.40–1.20)
GFR: 92.65 mL/min (ref >60.00)
Glucose, Bld: 198 mg/dL — ABNORMAL HIGH (ref 70–99)
Potassium: 4 mEq/L (ref 3.5–5.1)
Sodium: 140 mEq/L (ref 135–145)

## 2022-02-12 LAB — HEPATIC FUNCTION PANEL
ALT: 18 U/L (ref 0–35)
AST: 16 U/L (ref 0–37)
Albumin: 4.9 g/dL (ref 3.5–5.2)
Alkaline Phosphatase: 37 U/L — ABNORMAL LOW (ref 39–117)
Bilirubin, Direct: 0.1 mg/dL (ref 0.0–0.3)
Total Bilirubin: 0.7 mg/dL (ref 0.2–1.2)
Total Protein: 7 g/dL (ref 6.0–8.3)

## 2022-02-12 LAB — TSH: TSH: 1.16 u[IU]/mL (ref 0.35–5.50)

## 2022-02-12 LAB — B12 AND FOLATE PANEL
Folate: >24.2 ng/mL (ref >5.9)
Vitamin B-12: 701 pg/mL (ref 211–911)

## 2022-02-12 LAB — VITAMIN D 25 HYDROXY (VIT D DEFICIENCY, FRACTURES): VITD: 18.99 ng/mL — ABNORMAL LOW (ref 30.00–100.00)

## 2022-02-12 NOTE — Assessment & Plan Note (Signed)
Episodic issue for pt.  She has many medical reasons for fatigue- OSA (uses CPAP nightly), Vit D deficiency, DM, obesity, and bipolar depression.  She is aware that she has been under considerable stress in regards to finances but wants to make sure that nothing needs to be changed or adjusted.  Will check labs to assess electrolytes, vitamin levels, blood counts, thyroid.  If all WNL encouraged her to work on stress management and monitor sxs.  Pt expressed understanding and is in agreement w/ plan.  ?

## 2022-02-12 NOTE — Patient Instructions (Signed)
Follow up as needed or as scheduled ?We'll notify you of your lab results and make any changes if needed ?Continue to work on healthy diet and regular exercise- this will improve energy level ?Try not to worry!  I know that's easier said than done! ?Call with any questions or concerns ?Stay Safe!  Stay Healthy! ?Happy Easter!!! ?

## 2022-02-12 NOTE — Progress Notes (Signed)
? ?  Subjective:  ? ? Patient ID: [name], female    DOB: [birth year], 65 y.o.   MRN: [id number] ? ?HPI ?Fatigue- recurrent problem for pt.  Has hx of Vit D deficiency.  Has sleep related hypoxia- using CPAP.  Pt reports she has been 'really really tired for the past 2 weeks'.  Isn't sure if it is stress related- financial.  Brother tells her not to worry but she can't help it.  Is taking her medications as directed.  Reports 'sweating profusely'.  Pt is eating regularly. ? ?Review of Systems ?For ROS see HPI  ?   ?Objective:  ? Physical Exam ?Vitals reviewed.  ?Constitutional:   ?   General: She is not in acute distress. ?   Appearance: Normal appearance. She is well-developed. She is obese. She is not ill-appearing.  ?HENT:  ?   Head: Normocephalic and atraumatic.  ?Eyes:  ?   Conjunctiva/sclera: Conjunctivae normal.  ?   Pupils: Pupils are equal, round, and reactive to light.  ?Neck:  ?   Thyroid: No thyromegaly.  ?Cardiovascular:  ?   Rate and Rhythm: Normal rate and regular rhythm.  ?   Pulses: Normal pulses.  ?   Heart sounds: Normal heart sounds. No murmur heard. ?Pulmonary:  ?   Effort: Pulmonary effort is normal. No respiratory distress.  ?   Breath sounds: Normal breath sounds.  ?Abdominal:  ?   General: There is no distension.  ?   Palpations: Abdomen is soft.  ?   Tenderness: There is no abdominal tenderness.  ?Musculoskeletal:  ?   Cervical back: Normal range of motion and neck supple.  ?   Right lower leg: No edema.  ?   Left lower leg: No edema.  ?Lymphadenopathy:  ?   Cervical: No cervical adenopathy.  ?Skin: ?   General: Skin is warm and dry.  ?Neurological:  ?   Mental Status: She is alert and oriented to person, place, and time.  ?Psychiatric:     ?   Behavior: Behavior normal.  ? ? ? ? ? ?   ?Assessment & Plan:  ? ? ?

## 2022-02-13 ENCOUNTER — Other Ambulatory Visit: Payer: Self-pay | Admitting: Family Medicine

## 2022-02-13 DIAGNOSIS — E559 Vitamin D deficiency, unspecified: Secondary | ICD-10-CM

## 2022-02-13 MED ORDER — CHOLECALCIFEROL 1.25 MG (50000 UT) PO TABS: 1.0000 | ORAL_TABLET | ORAL | 2 refills | Status: DC

## 2022-02-13 NOTE — Progress Notes (Signed)
After hours call -  med not sent by provider as discussed in mychart ? ?Sending in 12 weeks per pcp recommendation ?

## 2022-02-16 ENCOUNTER — Other Ambulatory Visit: Payer: Self-pay

## 2022-02-18 ENCOUNTER — Telehealth: Payer: Self-pay | Admitting: *Deleted

## 2022-02-18 ENCOUNTER — Ambulatory Visit (INDEPENDENT_AMBULATORY_CARE_PROVIDER_SITE_OTHER): Payer: Medicare HMO

## 2022-02-18 DIAGNOSIS — I10 Essential (primary) hypertension: Secondary | ICD-10-CM

## 2022-02-18 DIAGNOSIS — E559 Vitamin D deficiency, unspecified: Secondary | ICD-10-CM

## 2022-02-18 DIAGNOSIS — E782 Mixed hyperlipidemia: Secondary | ICD-10-CM

## 2022-02-18 DIAGNOSIS — E119 Type 2 diabetes mellitus without complications: Secondary | ICD-10-CM

## 2022-02-18 DIAGNOSIS — F3131 Bipolar disorder, current episode depressed, mild: Secondary | ICD-10-CM

## 2022-02-18 NOTE — Chronic Care Management (AMB) (Signed)
?Chronic Care Management  ? ?CCM RN Visit Note ? ?02/18/2022 ?Name: [name] MRN: [id number] DOB: 17-Aug-1956 ? ?Subjective: ?[name] is a 66 y.o. year old female who is a primary care patient of Birdie Riddle, Aundra Millet, MD. The care management team was consulted for assistance with disease management and care coordination needs.   ? ?Engaged with patient by telephone for initial visit in response to provider referral for case management and/or care coordination services.  ? ?Consent to Services:  ?The patient was given the following information about Chronic Care Management services today, agreed to services, and gave verbal consent: 1. CCM service includes personalized support from designated clinical staff supervised by the primary care provider, including individualized plan of care and coordination with other care providers 2. 24/7 contact phone numbers for assistance for urgent and routine care needs. 3. Service will only be billed when office clinical staff spend 20 minutes or more in a month to coordinate care. 4. Only one practitioner may furnish and bill the service in a calendar month. 5.The patient may stop CCM services at any time (effective at the end of the month) by phone call to the office staff. 6. The patient will be responsible for cost sharing (co-pay) of up to 20% of the service fee (after annual deductible is met). Patient agreed to services and consent obtained. ? ?Patient agreed to services and verbal consent obtained.  ? ?Assessment: Review of patient past medical history, allergies, medications, health status, including review of consultants reports, laboratory and other test data, was performed as part of comprehensive evaluation and provision of chronic care management services.  ? ?SDOH (Social Determinants of Health) assessments and interventions performed:  ?SDOH Interventions   ? ?Flowsheet Row Most Recent Value  ?SDOH Interventions   ?Food Insecurity Interventions Intervention  Not Indicated  ?Financial Strain Interventions Intervention Not Indicated  ?Housing Interventions Intervention Not Indicated  ?Physical Activity Interventions Other (Comments)  [reviewed [employer] benefit]  ?Stress Interventions Other (Comment)  [referred to LCSW]  ?Transportation Interventions Intervention Not Indicated  ? ?  ?  ? ?[place] ? ?Allergies  ?Allergen Reactions  ? Nitrofurantoin Monohyd Macro Nausea Only  ?  Severe headache  ? Statins Other (See Comments)  ?  Severe weakness, pain  ? Alprazolam Anxiety and Other (See Comments)  ?  Hyper   ? Amoxicillin Rash  ? Prednisone Anxiety  ? Sulfa Antibiotics Nausea And Vomiting  ? ? ?Outpatient Encounter Medications as of 02/18/2022  ?Medication Sig  ? Alirocumab (PRALUENT) 150 MG/ML SOAJ INJECT 150MG INTO THE SKIN EVERY 14 DAYS  ? ALPHAGAN P 0.1 % SOLN Place 1 drop into both eyes every 8 (eight) hours.   ? azelastine (ASTELIN) 0.1 % nasal spray PLACE 2 SPRAYS INTO EACH NOSTRIL TWO TIMES A DAY AS DIRECTED  ? budesonide-formoterol (SYMBICORT) 160-4.5 MCG/ACT inhaler Inhale 2 puffs into the lungs in the morning and at bedtime.  ? Cholecalciferol (VITAMIN D3) 50 MCG (2000 UT) CAPS Take 2,000 Units by mouth daily.  ? Cholecalciferol 1.25 MG (50000 UT) TABS Take 1 tablet by mouth once a week.  ? CLARAVIS 20 MG capsule Take 20 mg by mouth daily.  ? clonazePAM (KLONOPIN) 2 MG tablet 1 mg 2 (two) times daily.   ? Cyanocobalamin (VITAMIN B-12 PO) Take by mouth.  ? fenofibrate (TRICOR) 145 MG tablet Take 1 tablet (145 mg total) by mouth daily.  ? fexofenadine (ALLEGRA) 180 MG tablet Take 1 tablet (180 mg total)  by mouth daily.  ? fluticasone (FLONASE) 50 MCG/ACT nasal spray SPRAY TWO SPRAYS IN EACH NOSTRIL ONCE DAILY  ? gabapentin (NEURONTIN) 600 MG tablet Take 1,200 mg by mouth 2 (two) times daily.   ? lamoTRIgine (LAMICTAL) 200 MG tablet Take 200 mg by mouth 2 (two) times daily.  ? LUMIGAN 0.01 % SOLN Place 1 drop into both eyes at bedtime.   ? meloxicam  (MOBIC) 15 MG tablet Take 1 tablet (15 mg total) by mouth daily.  ? metoprolol succinate (TOPROL-XL) 50 MG 24 hr tablet TAKE ONE TABLET BY MOUTH DAILY WITH A MEAL  ? montelukast (SINGULAIR) 10 MG tablet Take 1 tablet (10 mg total) by mouth at bedtime.  ? Multiple Vitamin (MULTIVITAMIN WITH MINERALS) TABS tablet Take 1 tablet by mouth daily.  ? OLANZapine (ZYPREXA) 5 MG tablet Take 5 mg by mouth daily as needed.  ? omega-3 acid ethyl esters (LOVAZA) 1 g capsule TAKE TWO CAPSULES BY MOUTH TWICE A DAY  ? OVER THE COUNTER MEDICATION Senna tablets- 8.6 mg daily  ? Pyridoxine HCl (VITAMIN B-6 PO) Take 1 tablet by mouth daily.  ? QUEtiapine (SEROQUEL XR) 300 MG 24 hr tablet Take 2 tablets (600 mg total) by mouth at bedtime. (Patient taking differently: Take 600 mg by mouth at bedtime. Taking 1 400 mg tablet at bedtime, takes 50 mg daily as needed)  ? UNABLE TO FIND CPAP  ? venlafaxine (EFFEXOR-XR) 75 MG 24 hr capsule Take 75 mg by mouth. Take 2 tabs in the am and 1 tab in afternoonn  ? ?No facility-administered encounter medications on file as of 02/18/2022.  ? ? ?Patient Active Problem List  ? Diagnosis Date Noted  ? Hx of adenomatous colonic polyps 07/2021  ? Sleep related hypoxia 12/17/2020  ? Cervical myelopathy ([place]) 10/16/2020  ? Elevated blood-pressure reading, without diagnosis of hypertension 10/16/2020  ? Lumbar radiculopathy 10/16/2020  ? Positive ANA (antinuclear antibody) 08/14/2020  ? Chronically dry eyes, bilateral 08/14/2020  ? Dry mouth 08/14/2020  ? Allergic rhinitis due to pollen 12/13/2018  ? Diet-controlled diabetes mellitus ([place]) 02/09/2018  ? Statin intolerance 11/04/2017  ? Family history of early CAD 03/10/2017  ? HTN (hypertension) 02/15/2017  ? Herniated nucleus pulposus, L5-S1 06/22/2014  ? Asthma, moderate persistent 10/09/2013  ? Obesity (BMI 30-39.9) 10/09/2013  ? Fatigue 12/12/2012  ? GERD (gastroesophageal reflux disease) 09/02/2012  ? Physical exam 07/03/2011  ? Vitamin D deficiency  09/15/2010  ? DEPRESSIVE DISORDER 07/17/2010  ? RHINITIS 06/14/2009  ? MUSCLE WEAKNESS (GENERALIZED) 01/03/2009  ? Hyperlipidemia 10/10/2008  ? BIPOLAR AFFECTIVE DISORDER 10/10/2008  ? ADD 10/10/2008  ? ? ?Conditions to be addressed/monitored:HTN, HLD, DMII, Bipolar Disorder, and Vitamin D Deficiency ? ?Care Plan : RN Care Manager Plan of Care  ?Updates made by Dimitri Ped, RN since 02/18/2022 12:00 AM  ?  ? ?Problem: Chronic Disease Management and Care Coordination Needs (DM,HTN ,HLD, Vitamin D deficiency)   ?Priority: High  ?  ? ?Long-Range Goal: Establish Plan of Care for Chronic Disease Management Needs (DM,HTN ,HLD, Vitamin D deficiency)   ?Start Date: 02/18/2022  ?Expected End Date: 02/18/2023  ?Priority: High  ?Note:   ?Current Barriers:  ?Knowledge Deficits related to plan of care for management of HTN, HLD, DMII, Bipolar Disorder, and Vitamin D Deficiency    ?Care Coordination needs related to Limited social support and Mental Health Concerns  ?Chronic Disease Management support and education needs related to HTN, HLD, DMII, Bipolar Disorder, and  Vitamin D Deficiency   ?  States she has been trying to cut out sweets but it is struggle at times.  States she does walk her dog 30 minutes a day.  States she is having issues with stress and coping with her illnesses.  States she has been having fatigue and started the high dose of vitamin D last week. Denies and chest pains, shortness of breath or swelling.  States she does not check her B/P at home. ? ?RNCM Clinical Goal(s):  ?Patient will verbalize understanding of plan for management of HTN, HLD, DMII, Bipolar Disorder, and  Vitamin D Deficiency  as evidenced by voiced adherence to plan of care ?verbalize basic understanding of  HTN, HLD, DMII, Bipolar Disorder, and  Vitamin D Deficiency  disease process and self health management plan as evidenced by voiced understanding and teach back  ?take all medications exactly as prescribed and will call provider  for medication related questions as evidenced by dispense report and pt verbalization  ?attend all scheduled medical appointments: Neurology 09/09/22 as evidenced by medical records ?demonstrate Improved adherence to

## 2022-02-18 NOTE — Chronic Care Management (AMB) (Signed)
?  Chronic Care Management  ? ?Note ? ?02/18/2022 ?Name: MADASYN HEATH MRN: [id number] DOB: [birth year] ? ?Shirleen TANNIA CONTINO is a 66 y.o. year old female who is a primary care patient of Birdie Riddle, Aundra Millet, MD. KENNLEY SCHWANDT is currently enrolled in care management services. An additional referral for Licensed Clinical SW was placed.  ? ?Follow up plan: ?Telephone appointment with care management team member scheduled for: 02/26/2022 ? ?Sundee Garland, CCMA ?Care Guide, Embedded Care Coordination ?[place]  Care Management  ?Direct Dial: [phone] ? ? ?

## 2022-02-18 NOTE — Patient Instructions (Signed)
Visit Information  ? ?Thank you for taking time to visit with me today. Please don't hesitate to contact me if I can be of assistance to you before our next scheduled telephone appointment. ? ?Following are the goals we discussed today:  ?Take all medications as prescribed ?Attend all scheduled provider appointments ?Call pharmacy for medication refills 3-7 days in advance of running out of medications ?Perform all self care activities independently  ?Call provider office for new concerns or questions  ?keep appointment with eye doctor ?check feet daily for cuts, sores or redness ?drink 6 to 8 glasses of water each day ?eat fish at least once per week ?fill half of plate with vegetables ?limit fast food meals to no more than 1 per week ?manage portion size ?switch to sugar-free drinks ?check blood pressure weekly ?choose a place to take my blood pressure (home, clinic or office, retail store) ?take blood pressure log to all doctor appointments ?eat more whole grains, fruits and vegetables, lean meats and healthy fats ?limit salt intake to '2300mg'$ /day ?call for medicine refill 2 or 3 days before it runs out ?take all medications exactly as prescribed ?call doctor with any symptoms you believe are related to your medicine ?call doctor when you experience any new symptoms ?Living With Diabetes ?Diabetes (type 1 diabetes mellitus or type 2 diabetes mellitus) is a condition in which the body does not have enough of a hormone called insulin, or the body does not respond properly to insulin. Normally, insulin allows sugars (glucose) to enter cells in the body. With diabetes, extra glucose builds up in the blood instead of going into cells. This results in high blood glucose (hyperglycemia). ?How to manage lifestyle changes ?Managing diabetes includes medical treatments as well as lifestyle changes. If diabetes is not managed well, serious physical and emotional complications can occur. Taking good care of yourself means  that you are responsible for: ?Monitoring glucose regularly. ?Eating a healthy diet. ?Exercising regularly. ?Meeting with health care providers. ?Taking medicines as directed. ?Most people feel some stress about managing their diabetes. When this stress becomes too much, it is known as diabetes-related distress. This is very common. Living with diabetes can place you at risk for diabetes distress, depression, or anxiety. These disorders can make diabetes more difficult to manage. ?How to recognize stress ?You may have diabetes distress if you: ?Avoid or ignore your daily diabetes care. This includes glucose testing, following a meal plan, and taking medications. ?Feel overwhelmed by your daily diabetes care. ?Experience emotional reactions such as anger, sadness, or fear related to your daily diabetes care. ?Feel fear or shame about not doing everything perfectly that you have been told to do. ?Emotional distress ?Symptoms of diabetes distress include: ?Anger about having a diagnosis of diabetes. ?Fear or frustration about your diagnosis and the changes you need to make to manage the condition. ?Being overly worried about the care that you need or the cost of the care that you need. ?Feeling like you caused your condition by doing something wrong. ?Fear about unpredictable fluctuations in your blood glucose, like low or high blood glucose. ?Feeling judged by your health care providers. ?Feeling very alone with the disease. ?Depression ?Having diabetes means that you are at a higher risk for depression. Your health care provider may test (screen) you for symptoms of depression. It is important to recognize symptoms and to start treatment for depression soon after it is diagnosed. The following are some symptoms of depression: ?Loss of interest in things  that you used to enjoy. ?Feeling depressed much or most of the time. ?A change in appetite. ?Trouble getting to sleep or staying asleep. ?Feeling tired most of the  day. ?Feeling nervous and anxious. ?Feeling guilty and worrying that you are a burden to others. ?Having thoughts of hurting yourself or feeling that you want to die. ?If you have any of these symptoms, more days than not, for 2 weeks or longer, you may have depression. This would be a good time to contact your health care provider. ?Follow these instructions at home: ?Managing diabetes distress ?The following are some ways to manage emotional distress: ?Learn as much as you can about diabetes and its treatment. Take one step at a time to improve your management. ?Meet with a certified diabetes care and education specialist. Take a class to learn how to manage your condition. ?Consider working with a Social worker or therapist. ?Keep a journal of your thoughts and concerns. ?Accept that some things are out of your control. ?Talk with other people who have diabetes. It can help to talk about the distress that you feel. ?Find ways to manage stress that work for you. These may include art or music therapy, exercise, meditation, and hobbies. ?Seek support from spiritual leaders, family, and friends. ? ?General instructions ?Do your best to follow your diabetes management plan. ?If you are struggling to follow your plan, talk with a certified diabetes care and education specialist, or with someone else who has diabetes. They may have ideas that will help. ?Forgive yourself for not being perfect. Almost everyone struggles with the tasks of diabetes. ?Keep all follow-up visits. This is important. ?Where to find support ?Search for information and support from the American Diabetes Association: www.diabetes.org ?Find a certified diabetes education and care specialist. Make an appointment through the Association of Diabetes Care & Education Specialists: www.diabeteseducator.org ?Contact a health care provider if: ?You believe your diabetes is getting out of control. ?You are concerned you may be depressed. ?You think your  medications are not helping control your diabetes. ?You are feeling overwhelmed with your diabetes. ?Get help right away if: ?You have thoughts about hurting yourself or others. ?If you ever feel like you may hurt yourself or others, or have thoughts about taking your own life, get help right away. You can go to your nearest emergency department or call: ?Your local emergency services (911 in the [place] ?A suicide crisis helpline, such as the [facility] at (281) 266-5599 or 988 in the [place]. This is open 24 hours a day. ?Summary ?Diabetes (type 1 diabetes mellitus or type 2 diabetes mellitus) is a condition in which the body does not have enough of a hormone called insulin, or the body does not respond properly to insulin. ?Living with diabetes puts you at risk for medical and emotional issues, such as diabetes distress, depression, and anxiety. ?Recognizing the symptoms of diabetes distress and depression may help you avoid problems with your diabetes control. If you experience symptoms, it is important to discuss this with your health care provider, certified diabetes care and education specialist, or therapist. ?It is important to start treatment for diabetes distress and depression soon after diagnosis. ?Ask your health care provider to recommend a therapist who understands both depression and diabetes. ?This information is not intended to replace advice given to you by your health care provider. Make sure you discuss any questions you have with your health care provider. ?Document Revised: 05/21/2021 Document Reviewed: 03/07/2020 ?Elsevier Patient Education ? 2022  [employer]. ?Vitamin D Deficiency ?Vitamin D deficiency is when your body does not have enough vitamin D. Vitamin D is important to your body because: ?It helps your body use other minerals. ?It helps to keep your bones strong and healthy. ?It may help to prevent some diseases. ?It helps your heart and other muscles work  well. ?Not getting enough vitamin D can make your bones soft. It can also cause other health problems. ?What are the causes? ?This condition may be caused by: ?Not eating enough foods that contain vitamin D. ?Not ge

## 2022-02-22 ENCOUNTER — Emergency Department (HOSPITAL_BASED_OUTPATIENT_CLINIC_OR_DEPARTMENT_OTHER)
Admission: EM | Admit: 2022-02-22 | Discharge: 2022-02-23 | Disposition: A | Discharge status: Home / Self Care | Payer: Medicare HMO | Attending: Emergency Medicine | Admitting: Emergency Medicine

## 2022-02-22 ENCOUNTER — Encounter (HOSPITAL_BASED_OUTPATIENT_CLINIC_OR_DEPARTMENT_OTHER): Payer: Self-pay

## 2022-02-22 ENCOUNTER — Other Ambulatory Visit: Payer: Self-pay

## 2022-02-22 DIAGNOSIS — R519 Headache, unspecified: Secondary | ICD-10-CM | POA: Diagnosis not present

## 2022-02-22 DIAGNOSIS — I10 Essential (primary) hypertension: Secondary | ICD-10-CM

## 2022-02-22 DIAGNOSIS — Z79899 Other long term (current) drug therapy: Secondary | ICD-10-CM | POA: Insufficient documentation

## 2022-02-22 DIAGNOSIS — E119 Type 2 diabetes mellitus without complications: Secondary | ICD-10-CM | POA: Insufficient documentation

## 2022-02-22 LAB — CBC WITH DIFFERENTIAL/PLATELET
Abs Immature Granulocytes: 0.01 10*3/uL (ref 0.00–0.07)
Basophils Absolute: 0 10*3/uL (ref 0.0–0.1)
Basophils Relative: 0 %
Eosinophils Absolute: 0 10*3/uL (ref 0.0–0.5)
Eosinophils Relative: 0 %
HCT: 38.2 % (ref 36.0–46.0)
Hemoglobin: 13.2 g/dL (ref 12.0–15.0)
Immature Granulocytes: 0 %
Lymphocytes Relative: 42 %
Lymphs Abs: 2 10*3/uL (ref 0.7–4.0)
MCH: 30.1 pg (ref 26.0–34.0)
MCHC: 34.6 g/dL (ref 30.0–36.0)
MCV: 87 fL (ref 80.0–100.0)
Monocytes Absolute: 0.4 10*3/uL (ref 0.1–1.0)
Monocytes Relative: 8 %
Neutro Abs: 2.3 10*3/uL (ref 1.7–7.7)
Neutrophils Relative %: 50 %
Platelets: 263 10*3/uL (ref 150–400)
RBC: 4.39 MIL/uL (ref 3.87–5.11)
RDW: 13.2 % (ref 11.5–15.5)
WBC: 4.7 10*3/uL (ref 4.0–10.5)
nRBC: 0 % (ref 0.0–0.2)

## 2022-02-22 LAB — COMPREHENSIVE METABOLIC PANEL
ALT: 20 U/L (ref 0–44)
AST: 16 U/L (ref 15–41)
Albumin: 5.2 g/dL — ABNORMAL HIGH (ref 3.5–5.0)
Alkaline Phosphatase: 34 U/L — ABNORMAL LOW (ref 38–126)
Anion gap: 11 (ref 5–15)
BUN: 15 mg/dL (ref 8–23)
CO2: 27 mmol/L (ref 22–32)
Calcium: 10.7 mg/dL — ABNORMAL HIGH (ref 8.9–10.3)
Chloride: 100 mmol/L (ref 98–111)
Creatinine, Ser: 0.83 mg/dL (ref 0.44–1.00)
GFR, Estimated: >60 mL/min (ref >60)
Glucose, Bld: 196 mg/dL — ABNORMAL HIGH (ref 70–99)
Potassium: 3.5 mmol/L (ref 3.5–5.1)
Sodium: 138 mmol/L (ref 135–145)
Total Bilirubin: 0.7 mg/dL (ref 0.3–1.2)
Total Protein: 7.6 g/dL (ref 6.5–8.1)

## 2022-02-22 LAB — URINALYSIS, ROUTINE W REFLEX MICROSCOPIC
Bilirubin Urine: NEGATIVE
Glucose, UA: NEGATIVE mg/dL
Hgb urine dipstick: NEGATIVE
Ketones, ur: NEGATIVE mg/dL
Nitrite: NEGATIVE
Protein, ur: NEGATIVE mg/dL
Specific Gravity, Urine: 1.016 (ref 1.005–1.030)
pH: 6.5 (ref 5.0–8.0)

## 2022-02-22 LAB — TROPONIN I (HIGH SENSITIVITY): Troponin I (High Sensitivity): 4 ng/L (ref <18)

## 2022-02-22 MED ORDER — HYDRALAZINE HCL 20 MG/ML IJ SOLN
10.0000 mg | Freq: Once | INTRAMUSCULAR | Status: DC
  Filled 2022-02-22: qty 1

## 2022-02-22 NOTE — ED Provider Notes (Signed)
?[facility] ?Provider Note ? ? ?CSN: [id number] ?Arrival date & time: 02/22/22  1942 ? ?  ? ?History ? ?Chief Complaint  ?Patient presents with  ? Hypertension  ? ? ?[name] is a 66 y.o. female. ? ?HPI ? ?  ? ?This is a 66 year old female who presents with headache and generalized fatigue.  Patient states she was monitoring her blood pressure at home and noted her systolic blood pressures in the 200s.  She states she takes metoprolol for her blood pressure and has been taking her medication.  She was seen and evaluated by her primary physician a couple weeks ago and states that her normal blood pressure is in the 140s range.  Recently she has felt increasing fatigue.  She states "I just do not feel right."  She describes her legs feeling "rubbery."  She also describes "frequent sweats."  She has had off-and-on headaches.  Mostly temporal and pounding in nature.  She thought initially it was related to sinuses.  Denies alcohol or drug use.  No history of thyroid disorders. ? ?Home Medications ?Prior to Admission medications   ?Medication Sig Start Date End Date Taking? Authorizing Provider  ?lisinopril (ZESTRIL) 20 MG tablet Take 1 tablet (20 mg total) by mouth daily. 02/23/22  Yes Braeson Rupe, Barbette Hair, MD  ?Alirocumab (PRALUENT) 150 MG/ML SOAJ INJECT '150MG'$  INTO THE SKIN EVERY 14 DAYS 01/09/22   Jettie Booze, MD  ?[facility] P 0.1 % SOLN Place 1 drop into both eyes every 8 (eight) hours.  05/03/20   [provider]  ?azelastine (ASTELIN) 0.1 % nasal spray PLACE 2 SPRAYS INTO EACH NOSTRIL TWO TIMES A DAY AS DIRECTED 08/22/21   Midge Minium, MD  ?budesonide-formoterol [facility]) 160-4.5 MCG/ACT inhaler Inhale 2 puffs into the lungs in the morning and at bedtime. 03/18/21   Brand Males, MD  ?Cholecalciferol (VITAMIN D3) 50 MCG (2000 UT) CAPS Take 2,000 Units by mouth daily.    [provider]  ?Cholecalciferol 1.25 MG (50000 UT) TABS Take 1 tablet by mouth  once a week. 02/13/22   Lesleigh Noe, MD  ?CLARAVIS 20 MG capsule Take 20 mg by mouth daily. 07/11/21   [provider]  ?clonazePAM (KLONOPIN) 2 MG tablet 1 mg 2 (two) times daily.  10/23/15   [provider]  ?Cyanocobalamin (VITAMIN B-12 PO) Take by mouth.    [provider]  ?fenofibrate (TRICOR) 145 MG tablet Take 1 tablet (145 mg total) by mouth daily. 02/09/22   Midge Minium, MD  ?fexofenadine (ALLEGRA) 180 MG tablet Take 1 tablet (180 mg total) by mouth daily. 08/05/21   Maximiano Coss, NP  ?fluticasone [facility]) 50 MCG/ACT nasal spray SPRAY TWO SPRAYS IN EACH NOSTRIL ONCE DAILY 07/16/21   Midge Minium, MD  ?gabapentin (NEURONTIN) 600 MG tablet Take 1,200 mg by mouth 2 (two) times daily.     [provider]  ?lamoTRIgine (LAMICTAL) 200 MG tablet Take 200 mg by mouth 2 (two) times daily.    [provider]  ?LUMIGAN 0.01 % SOLN Place 1 drop into both eyes at bedtime.  10/19/19   [provider]  ?meloxicam (MOBIC) 15 MG tablet Take 1 tablet (15 mg total) by mouth daily. 10/17/21   Maximiano Coss, NP  ?metoprolol succinate (TOPROL-XL) 50 MG 24 hr tablet TAKE ONE TABLET BY MOUTH DAILY WITH A MEAL 08/19/21   Midge Minium, MD  ?montelukast (SINGULAIR) 10 MG tablet Take 1 tablet (10 mg total)  by mouth at bedtime. 07/12/20   Midge Minium, MD  ?Multiple Vitamin (MULTIVITAMIN WITH MINERALS) TABS tablet Take 1 tablet by mouth daily.    [provider]  ?OLANZapine (ZYPREXA) 5 MG tablet Take 5 mg by mouth daily as needed.    [provider]  ?omega-3 acid ethyl esters (LOVAZA) 1 g capsule TAKE TWO CAPSULES BY MOUTH TWICE A DAY 02/04/22   Jettie Booze, MD  ?OVER THE COUNTER MEDICATION Senna tablets- 8.6 mg daily    [provider]  ?Pyridoxine HCl (VITAMIN B-6 PO) Take 1 tablet by mouth daily.    [provider]  ?QUEtiapine (SEROQUEL XR) 300 MG 24 hr tablet Take 2 tablets (600 mg total) by mouth at  bedtime. ?Patient taking differently: Take 600 mg by mouth at bedtime. Taking 1 400 mg tablet at bedtime, takes 50 mg daily as needed 04/09/21   Addison Lank, PA-C  ?UNABLE TO FIND CPAP    [provider]  ?venlafaxine (EFFEXOR-XR) 75 MG 24 hr capsule Take 75 mg by mouth. Take 2 tabs in the am and 1 tab in afternoonn    [provider]  ?   ? ?Allergies    ?Nitrofurantoin monohyd macro, Statins, Alprazolam, Amoxicillin, Prednisone, and Sulfa antibiotics   ? ?Review of Systems   ?Review of Systems  ?Constitutional:  Positive for diaphoresis and fatigue. Negative for fever.  ?Respiratory:  Negative for shortness of breath.   ?Cardiovascular:  Negative for chest pain.  ?Neurological:  Positive for headaches. Negative for weakness and numbness.  ?All other systems reviewed and are negative. ? ?Physical Exam ?Updated Vital Signs ?BP (!) 163/106   Pulse 60   Temp 99.3 ?F (37.4 ?C)   Resp 10   Ht 1.727 m ('5\' 8"'$ )   Wt 100.2 kg   LMP 11/09/2000   SpO2 97%   BMI 33.60 kg/m?  ?Physical Exam ?Vitals and nursing note reviewed.  ?Constitutional:   ?   Appearance: She is well-developed. She is obese. She is not ill-appearing.  ?HENT:  ?   Head: Normocephalic and atraumatic.  ?   Nose: Nose normal.  ?   Mouth/Throat:  ?   Mouth: Mucous membranes are moist.  ?Eyes:  ?   Pupils: Pupils are equal, round, and reactive to light.  ?Cardiovascular:  ?   Rate and Rhythm: Normal rate and regular rhythm.  ?   Heart sounds: Normal heart sounds.  ?Pulmonary:  ?   Effort: Pulmonary effort is normal. No respiratory distress.  ?   Breath sounds: No wheezing.  ?Abdominal:  ?   Palpations: Abdomen is soft.  ?   Tenderness: There is no abdominal tenderness.  ?Musculoskeletal:  ?   Cervical back: Neck supple.  ?Skin: ?   General: Skin is warm and dry.  ?Neurological:  ?   Mental Status: She is alert and oriented to person, place, and time.  ?   Comments: Cranial nerves II through XII intact, 5 out of 5 strength in all 4  extremities, no dysmetria to finger-nose-finger  ?Psychiatric:     ?   Mood and Affect: Mood normal.  ? ? ?ED Results / Procedures / Treatments   ?Labs ?(all labs ordered are listed, but only abnormal results are displayed) ?Labs Reviewed  ?COMPREHENSIVE METABOLIC PANEL - Abnormal; Notable for the following components:  ?    Result Value  ? Glucose, Bld 196 (*)   ? Calcium 10.7 (*)   ? Albumin 5.2 (*)   ?  Alkaline Phosphatase 34 (*)   ? All other components within normal limits  ?URINALYSIS, ROUTINE W REFLEX MICROSCOPIC - Abnormal; Notable for the following components:  ? APPearance HAZY (*)   ? Leukocytes,Ua MODERATE (*)   ? All other components within normal limits  ?CBC WITH DIFFERENTIAL/PLATELET  ?TSH  ?TROPONIN I (HIGH SENSITIVITY)  ? ? ?EKG ?EKG Interpretation ? ?Date/Time:  Sunday February 22 2022 20:12:21 EDT ?Ventricular Rate:  68 ?PR Interval:  182 ?QRS Duration: 94 ?QT Interval:  398 ?QTC Calculation: 423 ?R Axis:   79 ?Text Interpretation: Normal sinus rhythm Anterior infarct , age undetermined Abnormal ECG When compared with ECG of 09-Jan-2010 10:31, Anterior infarct is now Present T wave amplitude has decreased in Anterior leads Confirmed by Madalyn Rob [phone]) on 02/22/2022 9:29:03 PM ? ?Radiology ?CT Head Wo Contrast ? ?Result Date: 02/23/2022 ?CLINICAL DATA:  Headache, new or worsening (Age >= 50y) EXAM: CT HEAD WITHOUT CONTRAST TECHNIQUE: Contiguous axial images were obtained from the base of the skull through the vertex without intravenous contrast. RADIATION DOSE REDUCTION: This exam was performed according to the departmental dose-optimization program which includes automated exposure control, adjustment of the mA and/or kV according to patient size and/or use of iterative reconstruction technique. COMPARISON:  None. FINDINGS: Brain: Normal anatomic configuration. No abnormal intra or extra-axial mass lesion or fluid collection. No abnormal mass effect or midline shift. No evidence of acute  intracranial hemorrhage or infarct. Ventricular size is normal. Cerebellum unremarkable. Vascular: Unremarkable Skull: Intact Sinuses/Orbits: Paranasal sinuses are clear. Orbits are unremarkable. Other: Mastoid air cel

## 2022-02-22 NOTE — ED Triage Notes (Signed)
Pt c/o of headache and breaking out in sweats. ?Checked her BP at home over 200. ?Has taken her meds as prescribed ?

## 2022-02-23 ENCOUNTER — Encounter: Payer: Self-pay | Admitting: Family Medicine

## 2022-02-23 ENCOUNTER — Telehealth: Payer: Self-pay

## 2022-02-23 ENCOUNTER — Ambulatory Visit (INDEPENDENT_AMBULATORY_CARE_PROVIDER_SITE_OTHER): Payer: Medicare HMO | Admitting: Family Medicine

## 2022-02-23 ENCOUNTER — Emergency Department (HOSPITAL_BASED_OUTPATIENT_CLINIC_OR_DEPARTMENT_OTHER): Payer: Medicare HMO

## 2022-02-23 VITALS — BP 140/80 | HR 74 | Temp 97.7°F | Ht 69.0 in | Wt 221.5 lb

## 2022-02-23 DIAGNOSIS — E119 Type 2 diabetes mellitus without complications: Secondary | ICD-10-CM

## 2022-02-23 DIAGNOSIS — I10 Essential (primary) hypertension: Secondary | ICD-10-CM

## 2022-02-23 DIAGNOSIS — R519 Headache, unspecified: Secondary | ICD-10-CM | POA: Diagnosis not present

## 2022-02-23 DIAGNOSIS — L749 Eccrine sweat disorder, unspecified: Secondary | ICD-10-CM | POA: Diagnosis not present

## 2022-02-23 DIAGNOSIS — M6281 Muscle weakness (generalized): Secondary | ICD-10-CM | POA: Diagnosis not present

## 2022-02-23 LAB — TSH: TSH: 1.953 u[IU]/mL (ref 0.350–4.500)

## 2022-02-23 MED ORDER — LISINOPRIL 20 MG PO TABS: 20.0000 mg | ORAL_TABLET | Freq: Every day | ORAL | 0 refills | Status: DC

## 2022-02-23 NOTE — Progress Notes (Signed)
? ?Subjective:  ? ? ? Patient ID: [name], female    DOB: 1956/10/12, 66 y.o.   MRN: [id number] ? ?Chief Complaint  ?Patient presents with  ? Headache  ?  Started this morning, took Tylenol not too long ago  ? Hypertension  ?  Blood pressure elevated last night, went to [facility], was started on lisinopril 20 mg  ? ? ?HPI ?ER f/u-per pt, they were very specific about f/u today, but PCP no appt-will see her in 2 days.  Very thankful able to be seen today. ?HTN-bp's were "all over the place".  Some HA/fatigue.  Got concerned.  Went to ER d/t bp's being so high.  On metoprolol for palpitations and bp's-was doing well in feb.  Sl elevated 4/6.   A lot of stress and worry.  Not feeling well for 2 mo.  Saw PCP few wks ago as episodes of "sweating" and weakness for about 43moand legs were feeling "rubbery"  when went up stairs.   more emotional lately.  No changes in meds.  Legs feel "weak".  ER doc started Lisinopril.  ?Was fine at [facility] in Feb.  Dx 4/6 w/vit D deficiency and on vit d now.  ? ?No cp/sob.  ? ?No sugar in diet for 2 wks as trying to control DM.  ? ?Health Maintenance Due  ?Topic Date Due  ? COVID-19 Vaccine (5 - Booster for Moderna series) 06/08/2021  ? ? ?Past Medical History:  ?Diagnosis Date  ? Allergic rhinitis   ? Allergy   ? Anxiety   ? Arthritis   ? R knee, Left Knee  ? Asthma   ? Bipolar disorder ([place]   ? Cataract   ? Complication of anesthesia   ? had bad anxiety after anesthesia  ? Constipation, chronic   ? Depression   ? Diabetes mellitus [place] complication [place]   ? Dysrhythmia   ? Palpations occ, takes Tenormin  ? GERD (gastroesophageal reflux disease)   ? Glaucoma   ? Heart murmur   ? Hx of adenomatous colonic polyps 2008  ? Hyperlipidemia   ? Hypertension   ? MVP (mitral valve prolapse)   ? Sleep apnea   ? wears CPAP  ? Urinary tract infection   ? frequent, see Urololgist 12/16/2010  ? ? ?Past Surgical History:  ?Procedure Laterality Date  ? ABDOMINAL HYSTERECTOMY  2002  ? no  oophorectomy  ? BACK SURGERY  2001  ? L5 - S1  ? CATARACT EXTRACTION    ? COLONOSCOPY    ? NASAL SINUS SURGERY  01/2010  ? x2  ? TONSILLECTOMY    ? TOTAL KNEE ARTHROPLASTY  12/23/2011  ? Procedure: TOTAL KNEE ARTHROPLASTY;  Surgeon: DNinetta Lights MD;  [place]  Service: [place]opedics;  Laterality: Right;  DR MURPHY WANTS 120 MINUTES FOR SURGERY  ? TOTAL KNEE ARTHROPLASTY  08/31/2012  ? Procedure: TOTAL KNEE ARTHROPLASTY;  Surgeon: DNinetta Lights MD;  [place]  Service: Orthopedics;  Laterality: Left;  ? ? ?Outpatient Medications Prior to Visit  ?Medication Sig Dispense Refill  ? Alirocumab (PRALUENT) 150 MG/ML SOAJ INJECT '150MG'$  INTO THE SKIN EVERY 14 DAYS 2 mL 11  ? ALPHAGAN P 0.1 % SOLN Place 1 drop into both eyes every 8 (eight) hours.     ? azelastine (ASTELIN) 0.1 % nasal spray PLACE 2 SPRAYS INTO EACH NOSTRIL TWO TIMES A DAY AS DIRECTED 30 mL 2  ? budesonide-formoterol (SYMBICORT) 160-4.5 MCG/ACT inhaler Inhale 2  puffs into the lungs in the morning and at bedtime. 10.2 each 6  ? Cholecalciferol (VITAMIN D3) 50 MCG (2000 UT) CAPS Take 2,000 Units by mouth daily.    ? Cholecalciferol 1.25 MG (50000 UT) TABS Take 1 tablet by mouth once a week. 4 tablet 2  ? CLARAVIS 20 MG capsule Take 20 mg by mouth daily.    ? clonazePAM (KLONOPIN) 2 MG tablet 1 mg 2 (two) times daily.   0  ? Cyanocobalamin (VITAMIN B-12 PO) Take by mouth.    ? fenofibrate (TRICOR) 145 MG tablet Take 1 tablet (145 mg total) by mouth daily. 90 tablet 1  ? fexofenadine (ALLEGRA) 180 MG tablet Take 1 tablet (180 mg total) by mouth daily. 90 tablet 3  ? fluticasone (FLONASE) 50 MCG/ACT nasal spray SPRAY TWO SPRAYS IN EACH NOSTRIL ONCE DAILY 16 g 3  ? gabapentin (NEURONTIN) 600 MG tablet Take 1,200 mg by mouth 2 (two) times daily.     ? lamoTRIgine (LAMICTAL) 200 MG tablet Take 200 mg by mouth 2 (two) times daily.    ? lisinopril (ZESTRIL) 20 MG tablet Take 1 tablet (20 mg total) by mouth daily. 30 tablet 0  ? LUMIGAN 0.01 % SOLN  Place 1 drop into both eyes at bedtime.     ? meloxicam (MOBIC) 15 MG tablet Take 1 tablet (15 mg total) by mouth daily. 30 tablet 3  ? metoprolol succinate (TOPROL-XL) 50 MG 24 hr tablet TAKE ONE TABLET BY MOUTH DAILY WITH A MEAL 90 tablet 2  ? montelukast (SINGULAIR) 10 MG tablet Take 1 tablet (10 mg total) by mouth at bedtime. 90 tablet 1  ? Multiple Vitamin (MULTIVITAMIN WITH MINERALS) TABS tablet Take 1 tablet by mouth daily.    ? OLANZapine (ZYPREXA) 5 MG tablet Take 5 mg by mouth daily as needed.    ? omega-3 acid ethyl esters (LOVAZA) 1 g capsule TAKE TWO CAPSULES BY MOUTH TWICE A DAY 120 capsule 10  ? OVER THE COUNTER MEDICATION Senna tablets- 8.6 mg daily    ? Pyridoxine HCl (VITAMIN B-6 PO) Take 1 tablet by mouth daily.    ? QUEtiapine (SEROQUEL XR) 300 MG 24 hr tablet Take 2 tablets (600 mg total) by mouth at bedtime. (Patient taking differently: Take 600 mg by mouth at bedtime. Taking 1 400 mg tablet at bedtime, takes 50 mg daily as needed) 60 tablet 1  ? UNABLE TO FIND CPAP    ? venlafaxine (EFFEXOR-XR) 75 MG 24 hr capsule Take 75 mg by mouth. Take 2 tabs in the am and 1 tab in afternoonn    ? ?No facility-administered medications prior to visit.  ? ? ?Allergies  ?Allergen Reactions  ? Nitrofurantoin Monohyd Macro Nausea Only  ?  Severe headache  ? Statins Other (See Comments)  ?  Severe weakness, pain  ? Alprazolam Anxiety and Other (See Comments)  ?  Hyper   ? Amoxicillin Rash  ? Prednisone Anxiety  ? Sulfa Antibiotics Nausea And Vomiting  ? ?ROS neg/noncontributory except as noted HPI/below ? ? ?   ?Objective:  ?  ? ?BP 140/80   Pulse 74   Temp 97.7 ?F (36.5 ?C) (Temporal)   Ht '5\' 9"'$  (1.753 m)   Wt 221 lb 8 oz (100.5 kg)   LMP 11/09/2000   SpO2 97%   BMI 32.71 kg/m?  ?Wt Readings from Last 3 Encounters:  ?02/23/22 221 lb 8 oz (100.5 kg)  ?02/22/22 221 lb (100.2 kg)  ?02/12/22 220 lb  12.8 oz (100.2 kg)  ? ? ? ?  02/23/2022  ?  3:39 PM 02/23/2022  ?  1:15 AM 02/23/2022  ?  1:00 AM 02/23/2022  ?  12:45 AM 02/23/2022  ? 12:15 AM 02/23/2022  ? 12:00 AM  ?Vitals with BMI  ?Height '5\' 9"'$        ?Weight 221 lbs 8 oz       ?BMI 32.69       ?Systolic 169 678 938 101 751 150  ?Diastolic 80 99 025 75 96 88  ?Pulse 74 63 60 62 63 66  ?  ? ?Physical Exam  ? ?Gen: WDWN NAD OF ?HEENT: NCAT, conjunctiva not injected, sclera nonicteric ?NECK:  supple, no thyromegaly, no nodes, no carotid bruits ?CARDIAC: RRR, S1S2+, no murmur. DP 2+B ?LUNGS: CTAB. No wheezes ?ABDOMEN:  BS+, soft, NTND, No HSM, no masses ?EXT:  no edema ?MSK: no gross abnormalities.  ?NEURO: A&O x3.  CN II-XII intact.  ?PSYCH: normal mood. Good eye contact ? ?Reviewed [name] visit, labs, ER visit and labs.  CT head NAP ? ?   ?Assessment & Plan:  ? ?Problem List Items Addressed This Visit   ? ?  ? Cardiovascular and Mediastinum  ? HTN (hypertension) - Primary  ?  ? Endocrine  ? Diet-controlled diabetes mellitus ([place])  ?  ? Other  ? MUSCLE WEAKNESS (GENERALIZED)  ? ?Other Visit Diagnoses   ? ? Sweating abnormality      ? ?  ? HTN-chronic.  Was controlled, but pt not feeling well lately and bp elevated.  ?stress, ? DM, ?meds, poss CAD, other.  W/u no specific etiol.  Bp better now.  ? If her cuff is accurate, other.  Continue metoprolol and lisinopril for now.  Monitor blood pressures once to twice daily.  Bring her blood pressure cuff with her to her primary care visit in 2 days.  If things worsen, go back to the emergency room.  Will defer any further work-up to her PCP. ?2.  Diabetes type 2.  Uncontrolled with hyperglycemia.  Patient has been trying to manage with diet alone.  Sugars have been close to 200 when drawn in the lab.  Not sure if she is getting spikes both up and down.  Advised to keep a log of her sweating episodes.  It may give [place] some insight as to possibility of highs and lows.  Continue to work on exercise/diet ?3.  Leg heaviness/weakness-questionable from meds (doubt) but will have her hold fenofibrate for now.  Not sure if metoprolol is  contributing.  May need ABIs.  Her pulses are excellent ?4.  Sweating-not sure if meds, hyper/hypoglycemia, uncontrolled/labile blood pressures.  Keep a log of episodes.  If worse, ER.  Otherwise, keep follow-up appointment wi

## 2022-02-23 NOTE — Patient Instructions (Addendum)
Hold fenofibrate.  Log the sweats especially in relation to food.  Fatigue and weakness.   ?Check bp daily but different times. -bring machine to doctor.  ?

## 2022-02-23 NOTE — Telephone Encounter (Signed)
Patient called in stating that she was in the ED yesterday for high blood pressure. They expressed to her that it would be best to follow up with Dr.Tabori as soon as possible so they can discuss med change. No available appointments until next week, and patient declined seeing anyone else. Please advise.  ?

## 2022-02-23 NOTE — Telephone Encounter (Signed)
Ok to wait until next week since ER just started new medication yesterday ?

## 2022-02-23 NOTE — Discharge Instructions (Addendum)
Follow-up closely with your primary physician regarding blood pressure management.  You will be started on lisinopril.  If you develop any new or worsening symptoms such as chest pain, shortness of breath, worsening headache, you should be reevaluated. ?

## 2022-02-24 ENCOUNTER — Ambulatory Visit (INDEPENDENT_AMBULATORY_CARE_PROVIDER_SITE_OTHER): Payer: Medicare HMO | Admitting: Family Medicine

## 2022-02-24 ENCOUNTER — Ambulatory Visit (INDEPENDENT_AMBULATORY_CARE_PROVIDER_SITE_OTHER)
Admission: RE | Admit: 2022-02-24 | Discharge: 2022-02-24 | Disposition: A | Discharge status: Home / Self Care | Payer: Medicare HMO | Source: Ambulatory Visit | Attending: Family Medicine | Admitting: Family Medicine

## 2022-02-24 ENCOUNTER — Encounter: Payer: Self-pay | Admitting: Family Medicine

## 2022-02-24 VITALS — BP 150/90 | HR 80 | Temp 98.7°F | Ht 69.0 in | Wt 220.4 lb

## 2022-02-24 DIAGNOSIS — R051 Acute cough: Secondary | ICD-10-CM

## 2022-02-24 DIAGNOSIS — R042 Hemoptysis: Secondary | ICD-10-CM

## 2022-02-24 DIAGNOSIS — I10 Essential (primary) hypertension: Secondary | ICD-10-CM | POA: Diagnosis not present

## 2022-02-24 MED ORDER — LOSARTAN POTASSIUM 50 MG PO TABS: 50.0000 mg | ORAL_TABLET | Freq: Every day | ORAL | 0 refills | Status: DC

## 2022-02-24 NOTE — Progress Notes (Signed)
? ?Subjective:  ? ? ? Patient ID: [name], female    DOB: 1956-01-21, 66 y.o.   MRN: [id number] ? ?Chief Complaint  ?Patient presents with  ? Cough  ?  Coughed up blood about 8 times on yesterday evening, doesn't look like fresh blood, possible bronchitis  ? ? ?HPI ?Chief complaint: cough since last pm ?Symptom onset: this am 3:00 ?Pertinent positives: felt/tasted blood in mouth when woke up around 3am and spit it out-brownish blood. coughing up blood lafter-tried to lie down and rest.  Dry cough.   Ha, burning in chest.   ?Pertinent negatives: no f/c.  No vomit.  No cp. No sob.  Tired, not sleeping well. ?Treatments tried: nothing. ? ?Health Maintenance Due  ?Topic Date Due  ? COVID-19 Vaccine (5 - Booster for Moderna series) 06/08/2021  ? ? ?Past Medical History:  ?Diagnosis Date  ? Allergic rhinitis   ? Allergy   ? Anxiety   ? Arthritis   ? R knee, Left Knee  ? Asthma   ? Bipolar disorder ([place])   ? Cataract   ? Complication of anesthesia   ? had bad anxiety after anesthesia  ? Constipation, chronic   ? Depression   ? Diabetes mellitus without complication ([place])   ? Dysrhythmia   ? Palpations occ, takes Tenormin  ? GERD (gastroesophageal reflux disease)   ? Glaucoma   ? Heart murmur   ? Hx of adenomatous colonic polyps 2008  ? Hyperlipidemia   ? Hypertension   ? MVP (mitral valve prolapse)   ? Sleep apnea   ? wears CPAP  ? Urinary tract infection   ? frequent, see Urololgist 12/16/2010  ? ? ?Past Surgical History:  ?Procedure Laterality Date  ? ABDOMINAL HYSTERECTOMY  2002  ? no oophorectomy  ? BACK SURGERY  2001  ? L5 - S1  ? CATARACT EXTRACTION    ? COLONOSCOPY    ? NASAL SINUS SURGERY  01/2010  ? x2  ? TONSILLECTOMY    ? TOTAL KNEE ARTHROPLASTY  12/23/2011  ? Procedure: TOTAL KNEE ARTHROPLASTY;  Surgeon: Ninetta Lights, MD;  Location: [facility];  Service: Orthopedics;  Laterality: Right;  DR MURPHY WANTS 120 MINUTES FOR SURGERY  ? TOTAL KNEE ARTHROPLASTY  08/31/2012  ? Procedure: TOTAL KNEE ARTHROPLASTY;   Surgeon: Ninetta Lights, MD;  Location: [facility];  Service: Orthopedics;  Laterality: Left;  ? ? ?Outpatient Medications Prior to Visit  ?Medication Sig Dispense Refill  ? Alirocumab (PRALUENT) 150 MG/ML SOAJ INJECT '150MG'$  INTO THE SKIN EVERY 14 DAYS 2 mL 11  ? ALPHAGAN P 0.1 % SOLN Place 1 drop into both eyes every 8 (eight) hours.     ? azelastine (ASTELIN) 0.1 % nasal spray PLACE 2 SPRAYS INTO EACH NOSTRIL TWO TIMES A DAY AS DIRECTED 30 mL 2  ? budesonide-formoterol (SYMBICORT) 160-4.5 MCG/ACT inhaler Inhale 2 puffs into the lungs in the morning and at bedtime. 10.2 each 6  ? Cholecalciferol (VITAMIN D3) 50 MCG (2000 UT) CAPS Take 2,000 Units by mouth daily.    ? Cholecalciferol 1.25 MG (50000 UT) TABS Take 1 tablet by mouth once a week. 4 tablet 2  ? CLARAVIS 20 MG capsule Take 20 mg by mouth daily.    ? clonazePAM (KLONOPIN) 2 MG tablet 1 mg 2 (two) times daily.   0  ? Cyanocobalamin (VITAMIN B-12 PO) Take by mouth.    ? fenofibrate (TRICOR) 145 MG tablet Take 1 tablet (145 mg total) by  mouth daily. 90 tablet 1  ? fexofenadine (ALLEGRA) 180 MG tablet Take 1 tablet (180 mg total) by mouth daily. 90 tablet 3  ? fluticasone (FLONASE) 50 MCG/ACT nasal spray SPRAY TWO SPRAYS IN EACH NOSTRIL ONCE DAILY 16 g 3  ? gabapentin (NEURONTIN) 600 MG tablet Take 1,200 mg by mouth 2 (two) times daily.     ? lamoTRIgine (LAMICTAL) 200 MG tablet Take 200 mg by mouth 2 (two) times daily.    ? LUMIGAN 0.01 % SOLN Place 1 drop into both eyes at bedtime.     ? meloxicam (MOBIC) 15 MG tablet Take 1 tablet (15 mg total) by mouth daily. 30 tablet 3  ? metoprolol succinate (TOPROL-XL) 50 MG 24 hr tablet TAKE ONE TABLET BY MOUTH DAILY WITH A MEAL 90 tablet 2  ? montelukast (SINGULAIR) 10 MG tablet Take 1 tablet (10 mg total) by mouth at bedtime. 90 tablet 1  ? Multiple Vitamin (MULTIVITAMIN WITH MINERALS) TABS tablet Take 1 tablet by mouth daily.    ? OLANZapine (ZYPREXA) 5 MG tablet Take 5 mg by mouth daily as needed.    ? omega-3 acid  ethyl esters (LOVAZA) 1 g capsule TAKE TWO CAPSULES BY MOUTH TWICE A DAY 120 capsule 10  ? OVER THE COUNTER MEDICATION Senna tablets- 8.6 mg daily    ? Pyridoxine HCl (VITAMIN B-6 PO) Take 1 tablet by mouth daily.    ? QUEtiapine (SEROQUEL XR) 300 MG 24 hr tablet Take 2 tablets (600 mg total) by mouth at bedtime. (Patient taking differently: Take 600 mg by mouth at bedtime. Taking 1 400 mg tablet at bedtime, takes 50 mg daily as needed) 60 tablet 1  ? UNABLE TO FIND CPAP    ? venlafaxine (EFFEXOR-XR) 75 MG 24 hr capsule Take 75 mg by mouth. Take 2 tabs in the am and 1 tab in afternoonn    ? lisinopril (ZESTRIL) 20 MG tablet Take 1 tablet (20 mg total) by mouth daily. 30 tablet 0  ? ?No facility-administered medications prior to visit.  ? ? ?Allergies  ?Allergen Reactions  ? Nitrofurantoin Monohyd Macro Nausea Only  ?  Severe headache  ? Statins Other (See Comments)  ?  Severe weakness, pain  ? Alprazolam Anxiety and Other (See Comments)  ?  Hyper   ? Amoxicillin Rash  ? Prednisone Anxiety  ? Sulfa Antibiotics Nausea And Vomiting  ? ?ROS neg/noncontributory except as noted HPI/below ? ? ?   ?Objective:  ?  ? ?BP (!) 150/90   Pulse 80   Temp 98.7 ?F (37.1 ?C) (Temporal)   Ht '5\' 9"'$  (1.753 m)   Wt 220 lb 6 oz (100 kg)   LMP 11/09/2000   SpO2 96%   BMI 32.54 kg/m?  ?Wt Readings from Last 3 Encounters:  ?02/24/22 220 lb 6 oz (100 kg)  ?02/23/22 221 lb 8 oz (100.5 kg)  ?02/22/22 221 lb (100.2 kg)  ? ? ?Physical Exam  ? ?Gen: WDWN NAD ?HEENT: NCAT, conjunctiva not injected, sclera nonicteric ?TM WNL B, OP moist, no exudates  ?NECK:  supple, no thyromegaly, no nodes, no carotid bruits ?CARDIAC: RRR, S1S2+, no murmur. DP 2+B ?LUNGS: CTAB. No wheezes ?ABDOMEN:  BS+, soft, NTND, No HSM, no masses ?EXT:  no edema ?MSK: no gross abnormalities.  ?NEURO: A&O x3.  CN II-XII intact.  ?No TTP ant chest wall.  ?PSYCH: normal mood. Good eye contact ? ?   ?Assessment & Plan:  ? ?Problem List Items Addressed This Visit   ? ?  ?  Cardiovascular and Mediastinum  ? HTN (hypertension)  ? ?Other Visit Diagnoses   ? ? Hemoptysis    -  Primary  ? Acute cough      ? ?  ? Hemoptysis-?from cough.  Other-(sweats, etc).  Will do CXR  worse, ER ?Cough-?from ACE-I, other-check CXR.  Change lisiinopril to losartan-seeing pcp tomorrow.  Worse, ER.  Take omeprazole daily ?HTN-was out of control. Seen in ER.  Better.  Now cough possibly from lisinopril or other.  Change lisinopril to losartan.  Bring cuff to pcp. ? ?No orders of the defined types were placed in this encounter. ? ? ?Wellington Hampshire, MD ? ?

## 2022-02-24 NOTE — Patient Instructions (Addendum)
Change Lisinopril to losartan.  Bring cuff to [name].    Take omeprazole daily ?If worse, to ER.   ? ?Got to [place] to do chest x-ray now. ?

## 2022-02-25 ENCOUNTER — Ambulatory Visit (INDEPENDENT_AMBULATORY_CARE_PROVIDER_SITE_OTHER): Payer: Medicare HMO | Admitting: Family Medicine

## 2022-02-25 ENCOUNTER — Other Ambulatory Visit: Payer: Self-pay

## 2022-02-25 ENCOUNTER — Encounter: Payer: Self-pay | Admitting: Family Medicine

## 2022-02-25 VITALS — BP 142/69 | HR 73 | Temp 97.4°F | Resp 18 | Ht 69.0 in | Wt 220.8 lb

## 2022-02-25 DIAGNOSIS — I10 Essential (primary) hypertension: Secondary | ICD-10-CM

## 2022-02-25 DIAGNOSIS — R5383 Other fatigue: Secondary | ICD-10-CM | POA: Diagnosis not present

## 2022-02-25 DIAGNOSIS — J301 Allergic rhinitis due to pollen: Secondary | ICD-10-CM | POA: Diagnosis not present

## 2022-02-25 NOTE — Assessment & Plan Note (Signed)
Recurrent problem for pt.  I suspect this is a combination of new BP medication, increased stress recently, and seasonal allergies.  Her body does not respond well to stress in the past and she has had physical sxs that range from elevated BP, palpitations, HAs, nausea/diarrhea.  Reviewed recent labs and notes.  No obvious metabolic or underlying cause found.  Encouraged her to work on stress management if possible.  Pt expressed understanding and is in agreement w/ plan.  ?

## 2022-02-25 NOTE — Patient Instructions (Addendum)
Follow up as needed or as scheduled ?I think the headaches are due to allergies and sinus pressure ?CONTINUE the Losartan once daily along with the Metoprolol ?The fatigue is most likely due to the increased stress, recent medical appointments, and new BP medication.  I think this will work itself out ?Continue the allergy medication and nasal sprays daily ?Try and decrease your stress level- it takes a toll on you! ?Call with any questions or concerns ?Hang in there!!! ? ? ? ?

## 2022-02-25 NOTE — Assessment & Plan Note (Signed)
Deteriorated.  Pt has known seasonal allergies and I suspect the bleeding that she had in her mouth yesterday was actually from her nose/sinuses and not from her lungs/chest.  Reviewed that nasal sprays can cause bleeding and if she is lying down, this will drain into her mouth and throat.  Encouraged continued use of daily antihistamine, saline rinses.  Pt expressed understanding and is in agreement w/ plan.  ?

## 2022-02-25 NOTE — Assessment & Plan Note (Signed)
Blood pressure is mildly elevated today but is better than it has been.  Reviewed why her Lisinopril was changed to Losartan (cough) and told her I agree w/ this medication.  Discussed that it is not unusual to feel fatigued when BP is coming down b/c her body is used to higher pressures.  Reviewed the role that stress is playing in both her fatigue and BP and encouraged her to minimize this if possible.  Will continue to follow. ?

## 2022-02-25 NOTE — Progress Notes (Signed)
? ?  Subjective:  ? ? Patient ID: [name], female    DOB: 1956/02/25, 66 y.o.   MRN: [id number] ? ?HPI ?ER f/u- pt was seen in ER on 4/16 for elevated BP.  She then saw Dr Cherlynn Kaiser on 4/17 and 4/18 for HTN and cough.  At yesterday's visit her Lisinopril was changed to Losartan and she was instructed to take the Omeprazole daily. ? ?Pt feels that her cough is somewhat better today.  Had CXR yesterday but it has not been read yet.  Pt had blood in mouth yesterday when she awoke but has not had any blood today.  Still has occasional leg weakness and today 'they hurt more than they usually do'.  Pt notes increased fatigue since addition of 2nd BP medication.  Pt reports increased stress recently- 'it has totally wrecked my nerves'. ? ?Pt is taking daily Allegra, using Astelin and Flonase.  Has had frontal HAs. ? ? ?Review of Systems ?For ROS see HPI  ?   ?Objective:  ? Physical Exam ?Vitals reviewed.  ?Constitutional:   ?   General: She is not in acute distress. ?   Appearance: She is well-developed. She is obese. She is not ill-appearing or toxic-appearing.  ?HENT:  ?   Head: Normocephalic and atraumatic.  ?   Right Ear: Tympanic membrane normal.  ?   Left Ear: Tympanic membrane normal.  ?   Nose: Mucosal edema and rhinorrhea present.  ?   Right Sinus: No maxillary sinus tenderness or frontal sinus tenderness.  ?   Left Sinus: No maxillary sinus tenderness or frontal sinus tenderness.  ?   Mouth/Throat:  ?   Pharynx: Posterior oropharyngeal erythema (w/ PND) present.  ?   Comments: Near constant clearing of throat ?Eyes:  ?   Conjunctiva/sclera: Conjunctivae normal.  ?   Pupils: Pupils are equal, round, and reactive to light.  ?Cardiovascular:  ?   Rate and Rhythm: Normal rate and regular rhythm.  ?   Heart sounds: Normal heart sounds.  ?Pulmonary:  ?   Effort: Pulmonary effort is normal. No respiratory distress.  ?   Breath sounds: Normal breath sounds. No wheezing or rales.  ?Musculoskeletal:  ?   Cervical back:  Normal range of motion and neck supple.  ?Lymphadenopathy:  ?   Cervical: No cervical adenopathy.  ?Skin: ?   General: Skin is warm and dry.  ?Neurological:  ?   General: No focal deficit present.  ?   Mental Status: She is alert and oriented to person, place, and time.  ?Psychiatric:     ?   Mood and Affect: Mood normal.     ?   Behavior: Behavior normal.     ?   Thought Content: Thought content normal.  ? ? ? ? ? ?   ?Assessment & Plan:  ? ? ?

## 2022-02-26 ENCOUNTER — Ambulatory Visit: Payer: Medicare HMO | Admitting: *Deleted

## 2022-02-26 DIAGNOSIS — E119 Type 2 diabetes mellitus without complications: Secondary | ICD-10-CM

## 2022-02-26 DIAGNOSIS — I10 Essential (primary) hypertension: Secondary | ICD-10-CM

## 2022-02-26 DIAGNOSIS — M5416 Radiculopathy, lumbar region: Secondary | ICD-10-CM

## 2022-02-26 DIAGNOSIS — E669 Obesity, unspecified: Secondary | ICD-10-CM

## 2022-02-26 DIAGNOSIS — F3131 Bipolar disorder, current episode depressed, mild: Secondary | ICD-10-CM

## 2022-02-26 DIAGNOSIS — M6281 Muscle weakness (generalized): Secondary | ICD-10-CM

## 2022-02-26 DIAGNOSIS — E782 Mixed hyperlipidemia: Secondary | ICD-10-CM

## 2022-02-26 DIAGNOSIS — R5383 Other fatigue: Secondary | ICD-10-CM

## 2022-02-27 ENCOUNTER — Encounter: Payer: Self-pay | Admitting: *Deleted

## 2022-02-27 NOTE — Patient Instructions (Signed)
Visit Information  ? ?Thank you for taking time to visit with me today. Please don't hesitate to contact me if I can be of assistance to you before our next scheduled telephone appointment. ? ?Following are the goals we discussed today:  ?Patient Goals/Self-Care Activities: ?Begin personal counseling with LCSW, on a bi-weekly basis, to reduce and manage symptoms of Depression and Bipolar I Affective Disorder.   ?Keep follow-up appointments with Psychiatrist, Dr. Brayton Caves, Nurse Practitioner, Janeth Rase, and LCSW, Jasmine Awe, all with [facility] [phone]), for psychotropic medication management and psychotherapeutic counseling services.    ?Incorporate into daily practice - relaxation techniques, deep breathing exercises and mindfulness meditation strategies. ?Review EMMI educational material on "Depression in Adults", "Tips for How to Help Your Mood", and "Bipolar Disorder". ?Contact LCSW directly (# [phone]), if you have questions, need assistance, or if additional social work needs are identified between now and our next scheduled telephone outreach call.  ?Follow-Up:  03/12/2022 at 9:00 am ? ?Please call the care guide team at [phone] if you need to cancel or reschedule your appointment.  ? ?If you are experiencing a Mental Health or [place] or need someone to talk to, please call the Suicide and Crisis Lifeline: 988 ?call the [place] National Suicide Prevention Lifeline: 857 652 2530 or TTY: 205 455 7191 TTY (602)073-7691) to talk to a trained counselor ?call 1-800-273-TALK (toll free, 24 hour hotline) ?go to [facility] [address] [place] [phone]) ?call the [facility]: [phone] ?call 911  ? ?Following is a copy of your full care plan:  ?Care Plan : [place]  ?Updates made by Francis Gaines, LCSW since 02/27/2022 12:00 AM  ?  ? ?Problem: Reduce and Manage My Symptoms  of Bipolar I Affective Disorder and Depression.   ?Priority: High  ?  ? ?Goal: Reduce and Manage My Symptoms of Bipolar I Affective Disorder and Depression.   ?Start Date: 02/26/2022  ?Expected End Date: 04/28/2022  ?This Visit's Progress: On track  ?Priority: High  ?Note:   ?Current Barriers:   ?Acute Mental Health needs related to Depressive Disorder, Bipolar I Affective Disorder, and Attention Deficit Disorder, requires Support, Education, Resources, Referrals, Advocacy, and Care Coordination, in order to meet unmet Acute Mental Health needs. ?Clinical Goal(s):  ?Patient will work with LCSW, to reduce and manage symptoms of Depression and Bipolar I Affective Disorder, until re-established with mental health counseling and supportive services.       ?Patient will increase knowledge and/or ability of:  ?      Coping Skills, Healthy Habits, Self-Management Skills, Stress Reduction, Home Safety, and Utilizing [employer] and Resources.   ?Interventions: ?Collaboration with Primary Care Physician, Dr. Annye Asa regarding development and update of comprehensive plan of care, as evidenced by provider attestation and co-signature. ?Inter-disciplinary care team collaboration (see longitudinal plan of care). ?Clinical Interventions:  ?Assessed patient's previous treatment, needs, coping skills, current treatment, support system, and barriers to care. ?PHQ-2 and PHQ-9 Depression Screening Tool performed, and results reviewed with patient. ?Mindfulness Meditation Strategies, Relaxation Techniques and Deep Breathing Exercises taught, and encouraged daily. ?Solution-Focused Therapy performed. ?Verbalization of Feelings encouraged. ?Emotional Support provided. ?Problem Solving Solutions developed. ?Cognitive Behavioral Therapy initiated. ?Quality of Sleep assessed, and Sleep Hygiene Techniques promoted. ?Increase Level of Activity/Exercise emphasized.    ?Discussed plans with patient for ongoing care management  follow-up, and provided patient with direct contact information for care management team. ?Discussed several options for long-term counseling  based on need and insurance through [employer], but patient is already established providers at [facility].   ?Patient Goals/Self-Care Activities: ?Begin personal counseling with LCSW, on a bi-weekly basis, to reduce and manage symptoms of Depression and Bipolar I Affective Disorder.   ?Keep follow-up appointments with Psychiatrist, Dr. Brayton Caves, Nurse Practitioner, Janeth Rase, and LCSW, Jasmine Awe, all with [facility] [phone]), for psychotropic medication management and psychotherapeutic counseling services.    ?Incorporate into daily practice - relaxation techniques, deep breathing exercises and mindfulness meditation strategies. ?Review EMMI educational material on "Depression in Adults", "Tips for How to Help Your Mood", and "Bipolar Disorder". ?Contact LCSW directly (# [phone]), if you have questions, need assistance, or if additional social work needs are identified between now and our next scheduled telephone outreach call.  ?Follow-Up:  03/12/2022 at 9:00 am ?  ? ? ?Consent to CCM Services: ?Ms. Headrick was given information about Chronic Care Management services including:  ?CCM service includes personalized support from designated clinical staff supervised by her physician, including individualized plan of care and coordination with other care providers ?24/7 contact phone numbers for assistance for urgent and routine care needs. ?Service will only be billed when office clinical staff spend 20 minutes or more in a month to coordinate care. ?Only one practitioner may furnish and bill the service in a calendar month. ?The patient may stop CCM services at any time (effective at the end of the month) by phone call to the office staff. ?The patient will be responsible for cost sharing (co-pay) of up to 20% of  the service fee (after annual deductible is met). ? ?Patient agreed to services and verbal consent obtained.  ? ?Patient verbalizes understanding of instructions and care plan provided today and agrees to view in [place]. Active MyChart status confirmed with patient.   ? ?Telephone follow up appointment with care management team member scheduled for:  03/12/2022 at 9:00 am. ? ?Nat Christen LCSW ?Licensed Clinical Social Worker ?[facility] ?[phone]  ? ? ?  ?

## 2022-02-27 NOTE — Chronic Care Management (AMB) (Signed)
Chronic Care Management    Clinical Social Work Note  02/27/2022 Name: [name] MRN: [id number] DOB: [birth year]  Concha Se Wyer is a 66 y.o. year old female who is a primary care patient of Tabori, Helane Rima, MD. The CCM team was consulted to assist the patient with chronic disease management and/or care coordination needs related to: [employer] and Mental Health Counseling and Resources.   Engaged with patient by telephone for initial visit in response to provider referral for social work chronic care management and care coordination services.   Consent to Services:  The patient was given information about Chronic Care Management services, agreed to services, and gave verbal consent prior to initiation of services.  Please see initial visit note for detailed documentation.   Patient agreed to services and consent obtained.   Assessment: Review of patient past medical history, allergies, medications, and health status, including review of relevant consultants reports was performed today as part of a comprehensive evaluation and provision of chronic care management and care coordination services.     SDOH (Social Determinants of Health) assessments and interventions performed:  SDOH Interventions    Flowsheet Row Most Recent Value  SDOH Interventions   Food Insecurity Interventions Intervention Not Indicated  Financial Strain Interventions Intervention Not Indicated  Housing Interventions Intervention Not Indicated  Intimate Partner Violence Interventions Intervention Not Indicated  Physical Activity Interventions Intervention Not Indicated  Stress Interventions Offered [employer] Resources, Provide Counseling, Other (Comment)  [Well-established with psychiatrist and therapist]  Social Connections Interventions Intervention Not Indicated  Transportation Interventions Intervention Not Indicated  Depression Interventions/Treatment  Medication, Counseling, Currently  on Treatment        Advanced Directives Status: See Care Plan for related entries.  CCM Care Plan  Allergies  Allergen Reactions   Nitrofurantoin Monohyd Macro Nausea Only    Severe headache   Statins Other (See Comments)    Severe weakness, pain   Alprazolam Anxiety and Other (See Comments)    Hyper    Amoxicillin Rash   Prednisone Anxiety   Sulfa Antibiotics Nausea And Vomiting    Outpatient Encounter Medications as of 02/26/2022  Medication Sig   Alirocumab (PRALUENT) 150 MG/ML SOAJ INJECT 150MG  INTO THE SKIN EVERY 14 DAYS   ALPHAGAN P 0.1 % SOLN Place 1 drop into both eyes every 8 (eight) hours.    azelastine (ASTELIN) 0.1 % nasal spray PLACE 2 SPRAYS INTO EACH NOSTRIL TWO TIMES A DAY AS DIRECTED   budesonide-formoterol (SYMBICORT) 160-4.5 MCG/ACT inhaler Inhale 2 puffs into the lungs in the morning and at bedtime.   Cholecalciferol (VITAMIN D3) 50 MCG (2000 UT) CAPS Take 2,000 Units by mouth daily.   Cholecalciferol 1.25 MG (50000 UT) TABS Take 1 tablet by mouth once a week.   CLARAVIS 20 MG capsule Take 20 mg by mouth daily.   clonazePAM (KLONOPIN) 2 MG tablet 1 mg 2 (two) times daily.    Cyanocobalamin (VITAMIN B-12 PO) Take by mouth.   fenofibrate (TRICOR) 145 MG tablet Take 1 tablet (145 mg total) by mouth daily.   fexofenadine (ALLEGRA) 180 MG tablet Take 1 tablet (180 mg total) by mouth daily.   fluticasone (FLONASE) 50 MCG/ACT nasal spray SPRAY TWO SPRAYS IN EACH NOSTRIL ONCE DAILY   gabapentin (NEURONTIN) 600 MG tablet Take 1,200 mg by mouth 2 (two) times daily.    lamoTRIgine (LAMICTAL) 200 MG tablet Take 200 mg by mouth 2 (two) times daily.   losartan (COZAAR) 50 MG tablet Take 1  tablet (50 mg total) by mouth daily.   LUMIGAN 0.01 % SOLN Place 1 drop into both eyes at bedtime.    meloxicam (MOBIC) 15 MG tablet Take 1 tablet (15 mg total) by mouth daily.   metoprolol succinate (TOPROL-XL) 50 MG 24 hr tablet TAKE ONE TABLET BY MOUTH DAILY WITH A MEAL    montelukast (SINGULAIR) 10 MG tablet Take 1 tablet (10 mg total) by mouth at bedtime.   Multiple Vitamin (MULTIVITAMIN WITH MINERALS) TABS tablet Take 1 tablet by mouth daily.   OLANZapine (ZYPREXA) 5 MG tablet Take 5 mg by mouth daily as needed.   omega-3 acid ethyl esters (LOVAZA) 1 g capsule TAKE TWO CAPSULES BY MOUTH TWICE A DAY   OVER THE COUNTER MEDICATION Senna tablets- 8.6 mg daily   Pyridoxine HCl (VITAMIN B-6 PO) Take 1 tablet by mouth daily.   QUEtiapine (SEROQUEL XR) 300 MG 24 hr tablet Take 2 tablets (600 mg total) by mouth at bedtime. (Patient taking differently: Take 600 mg by mouth at bedtime. Taking 1 400 mg tablet at bedtime, takes 50 mg daily as needed)   UNABLE TO FIND CPAP   venlafaxine (EFFEXOR-XR) 75 MG 24 hr capsule Take 75 mg by mouth. Take 2 tabs in the am and 1 tab in afternoonn   No facility-administered encounter medications on file as of 02/26/2022.    Patient Active Problem List   Diagnosis Date Noted   Hx of adenomatous colonic polyps 07/2021   Sleep related hypoxia 12/17/2020   Cervical myelopathy (HCC) 10/16/2020   Elevated blood-pressure reading, without diagnosis of hypertension 10/16/2020   Lumbar radiculopathy 10/16/2020   Positive ANA (antinuclear antibody) 08/14/2020   Chronically dry eyes, bilateral 08/14/2020   Dry mouth 08/14/2020   Allergic rhinitis due to pollen 12/13/2018   Diet-controlled diabetes mellitus (HCC) 02/09/2018   Statin intolerance 11/04/2017   Family history of early CAD 03/10/2017   HTN (hypertension) 02/15/2017   Herniated nucleus pulposus, L5-S1 06/22/2014   Asthma, moderate persistent 10/09/2013   Obesity (BMI 30-39.9) 10/09/2013   Fatigue 12/12/2012   GERD (gastroesophageal reflux disease) 09/02/2012   Physical exam 07/03/2011   Vitamin D deficiency 09/15/2010   DEPRESSIVE DISORDER 07/17/2010   RHINITIS 06/14/2009   MUSCLE WEAKNESS (GENERALIZED) 01/03/2009   Hyperlipidemia 10/10/2008   BIPOLAR AFFECTIVE DISORDER  10/10/2008   ADD 10/10/2008    Conditions to be addressed/monitored: Depression and Bipolar Disorder.  Limited Social Support, Mental Health Concerns, Family and Relationship Dysfunction, Social Isolation, and Lacks Knowledge of [employer].  Care Plan : LCSW Plan of Care  Updates made by Karolee Stamps, LCSW since 02/27/2022 12:00 AM     Problem: Reduce and Manage My Symptoms of Bipolar I Affective Disorder and Depression.   Priority: High     Goal: Reduce and Manage My Symptoms of Bipolar I Affective Disorder and Depression.   Start Date: 02/26/2022  Expected End Date: 04/28/2022  This Visit's Progress: On track  Priority: High  Note:   Current Barriers:   Acute Mental Health needs related to Depressive Disorder, Bipolar I Affective Disorder, and Attention Deficit Disorder, requires Support, Education, Resources, Referrals, Advocacy, and Care Coordination, in order to meet unmet Acute Mental Health needs. Clinical Goal(s):  Patient will work with LCSW, to reduce and manage symptoms of Depression and Bipolar I Affective Disorder, until re-established with mental health counseling and supportive services.       Patient will increase knowledge and/or ability of:  Coping Skills, Healthy Habits, Self-Management Skills, Stress Reduction, Home Safety, and Utilizing [employer] and Resources.   Interventions: Collaboration with Primary Care Physician, Dr. Neena Rhymes regarding development and update of comprehensive plan of care, as evidenced by provider attestation and co-signature. Inter-disciplinary care team collaboration (see longitudinal plan of care). Clinical Interventions:  Assessed patient's previous treatment, needs, coping skills, current treatment, support system, and barriers to care. PHQ-2 and PHQ-9 Depression Screening Tool performed, and results reviewed with patient. Mindfulness Meditation Strategies, Relaxation Techniques and Deep Breathing  Exercises taught, and encouraged daily. Solution-Focused Therapy performed. Verbalization of Feelings encouraged. Emotional Support provided. Problem Solving Solutions developed. Cognitive Behavioral Therapy initiated. Quality of Sleep assessed, and Sleep Hygiene Techniques promoted. Increase Level of Activity/Exercise emphasized.    Discussed plans with patient for ongoing care management follow-up, and provided patient with direct contact information for care management team. Discussed several options for long-term counseling based on need and insurance through [facility], but patient is already established providers at [facility].   Patient Goals/Self-Care Activities: Begin personal counseling with LCSW, on a bi-weekly basis, to reduce and manage symptoms of Depression and Bipolar I Affective Disorder.   Keep follow-up appointments with Psychiatrist, Dr. Lynden Ang, Nurse Practitioner, Oneta Rack, and LCSW, Louie Boston, all with [facility] [phone]), for psychotropic medication management and psychotherapeutic counseling services.    Incorporate into daily practice - relaxation techniques, deep breathing exercises and mindfulness meditation strategies. Review EMMI educational material on "Depression in Adults", "Tips for How to Help Your Mood", and "Bipolar Disorder". Contact LCSW directly (# [phone]), if you have questions, need assistance, or if additional social work needs are identified between now and our next scheduled telephone outreach call.  Follow-Up:  03/12/2022 at 9:00 am   Danford Bad LCSW Licensed Clinical Social Worker [facility] [phone]

## 2022-03-03 DIAGNOSIS — H401112 Primary open-angle glaucoma, right eye, moderate stage: Secondary | ICD-10-CM | POA: Diagnosis not present

## 2022-03-05 DIAGNOSIS — F3132 Bipolar disorder, current episode depressed, moderate: Secondary | ICD-10-CM | POA: Diagnosis not present

## 2022-03-05 DIAGNOSIS — R69 Illness, unspecified: Secondary | ICD-10-CM | POA: Diagnosis not present

## 2022-03-06 DIAGNOSIS — L718 Other rosacea: Secondary | ICD-10-CM | POA: Diagnosis not present

## 2022-03-08 DIAGNOSIS — F3131 Bipolar disorder, current episode depressed, mild: Secondary | ICD-10-CM

## 2022-03-08 DIAGNOSIS — E119 Type 2 diabetes mellitus without complications: Secondary | ICD-10-CM | POA: Diagnosis not present

## 2022-03-08 DIAGNOSIS — I10 Essential (primary) hypertension: Secondary | ICD-10-CM | POA: Diagnosis not present

## 2022-03-08 DIAGNOSIS — E782 Mixed hyperlipidemia: Secondary | ICD-10-CM

## 2022-03-08 DIAGNOSIS — R69 Illness, unspecified: Secondary | ICD-10-CM | POA: Diagnosis not present

## 2022-03-09 DIAGNOSIS — G4733 Obstructive sleep apnea (adult) (pediatric): Secondary | ICD-10-CM | POA: Diagnosis not present

## 2022-03-10 ENCOUNTER — Telehealth: Payer: Self-pay

## 2022-03-10 NOTE — Telephone Encounter (Signed)
Pt is concerned losartan is interacting or causing night sweats. Dr Waylan Rocher rx the losartan should we refer back to them? ? ?

## 2022-03-10 NOTE — Telephone Encounter (Signed)
Patient called in stating that she was prescribed a blood pressure medication recently and has noticed since taking it she wakes up drenched in sweat. Wanting to know if this is a common side effect or if it is interacting with her sleep medication.  ?

## 2022-03-11 NOTE — Telephone Encounter (Signed)
Called pt back and informed her, she was happy with this and voiced understanding  ?

## 2022-03-11 NOTE — Telephone Encounter (Signed)
It would be unusual for the BP medication to be causing this.  I am more worried that her sugar is dropping low overnight.  She needs to make sure she is eating regularly and has something next to her bed in case this happens again. ?

## 2022-03-12 ENCOUNTER — Other Ambulatory Visit: Payer: Self-pay

## 2022-03-12 ENCOUNTER — Ambulatory Visit (INDEPENDENT_AMBULATORY_CARE_PROVIDER_SITE_OTHER): Payer: Medicare HMO | Admitting: *Deleted

## 2022-03-12 DIAGNOSIS — E782 Mixed hyperlipidemia: Secondary | ICD-10-CM

## 2022-03-12 DIAGNOSIS — I10 Essential (primary) hypertension: Secondary | ICD-10-CM

## 2022-03-12 DIAGNOSIS — R5383 Other fatigue: Secondary | ICD-10-CM

## 2022-03-12 DIAGNOSIS — M5416 Radiculopathy, lumbar region: Secondary | ICD-10-CM

## 2022-03-12 DIAGNOSIS — F3131 Bipolar disorder, current episode depressed, mild: Secondary | ICD-10-CM

## 2022-03-12 MED ORDER — LOSARTAN POTASSIUM 50 MG PO TABS: 50.0000 mg | ORAL_TABLET | Freq: Every day | ORAL | 0 refills | Status: DC

## 2022-03-12 NOTE — Patient Instructions (Signed)
Visit Information ? ?Thank you for taking time to visit with me today. Please don't hesitate to contact me if I can be of assistance to you before our next scheduled telephone appointment. ? ?Following are the goals we discussed today:  ?Patient Goals/Self-Care Activities: ?Continue to receive personal counseling with LCSW, on a bi-weekly basis, to reduce and manage symptoms of Depression and Bipolar I Affective Disorder, until well-controlled.   ?Keep follow-up appointments with Psychiatrist, Dr. Brayton Caves with [facility] [phone]), for psychotropic medication management. ?Keep follow-up appointments with Nurse Practitioner, Janeth Rase with [facility] [phone]), for management of mood stabilization. ?Keep follow-up appointments with Licensed Clinical Social Worker, Jasmine Awe with [facility] [phone]), for psychotherapeutic counseling services.    ?Continue to incorporate into daily practice - relaxation techniques, deep breathing exercises and mindfulness meditation strategies. ?Thorough review of EMMI educational material on "Depression in Adults", "Tips for How to Help Your Mood", and "Bipolar Disorder", to ensure understanding. ?Contact LCSW directly (# [phone]), if you have questions, need assistance, or if additional social work needs are identified between now and our next scheduled telephone outreach call.  ?Follow-Up:  03/26/2022 at 12:45 pm ? ?Please call the care guide team at [phone] if you need to cancel or reschedule your appointment.  ? ?If you are experiencing a Mental Health or [place] or need someone to talk to, please call the Suicide and Crisis Lifeline: 988 ?call the [place] National Suicide Prevention Lifeline: 563-125-9723 or TTY: (231)829-2697 TTY 4404891870) to talk to a trained counselor ?call 1-800-273-TALK (toll free, 24 hour hotline) ?go to [facility] [address] [place] [phone]) ?call the [facility]: [phone] ?call 911  ? ?Patient verbalizes understanding of instructions and care plan provided today and agrees to view in [place]. Active MyChart status confirmed with patient.   ? ?Nat Christen LCSW ?Licensed Clinical Social Worker ?[facility] ?[phone]  ?

## 2022-03-12 NOTE — Chronic Care Management (AMB) (Signed)
?Chronic Care Management  ? ? Clinical Social Work Note ? ?03/12/2022 ?Name: [name] MRN: [id number] DOB: [birth year] ? ?[name] is a 66 y.o. year old female who is a primary care patient of Birdie Riddle, Aundra Millet, MD. The CCM team was consulted to assist the patient with chronic disease management and/or care coordination needs related to: Mental Health Counseling and Resources.  ? ?Engaged with patient by telephone for follow up visit in response to provider referral for social work chronic care management and care coordination services.  ? ?Consent to Services:  ?The patient was given information about Chronic Care Management services, agreed to services, and gave verbal consent prior to initiation of services.  Please see initial visit note for detailed documentation.  ? ?Patient agreed to services and consent obtained.  ? ?Assessment: Review of patient past medical history, allergies, medications, and health status, including review of relevant consultants reports was performed today as part of a comprehensive evaluation and provision of chronic care management and care coordination services.    ? ?SDOH (Social Determinants of Health) assessments and interventions performed:   ? ?Advanced Directives Status: Not addressed in this encounter. ? ?CCM Care Plan ? ?Allergies  ?Allergen Reactions  ? Nitrofurantoin Monohyd Macro Nausea Only  ?  Severe headache  ? Statins Other (See Comments)  ?  Severe weakness, pain  ? Alprazolam Anxiety and Other (See Comments)  ?  Hyper   ? Amoxicillin Rash  ? Prednisone Anxiety  ? Sulfa Antibiotics Nausea And Vomiting  ? ? ?Outpatient Encounter Medications as of 03/12/2022  ?Medication Sig  ? Alirocumab (PRALUENT) 150 MG/ML SOAJ INJECT '150MG'$  INTO THE SKIN EVERY 14 DAYS  ? ALPHAGAN P 0.1 % SOLN Place 1 drop into both eyes every 8 (eight) hours.   ? azelastine (ASTELIN) 0.1 % nasal spray PLACE 2 SPRAYS INTO EACH NOSTRIL TWO TIMES A DAY AS DIRECTED  ? budesonide-formoterol  (SYMBICORT) 160-4.5 MCG/ACT inhaler Inhale 2 puffs into the lungs in the morning and at bedtime.  ? Cholecalciferol (VITAMIN D3) 50 MCG (2000 UT) CAPS Take 2,000 Units by mouth daily.  ? Cholecalciferol 1.25 MG (50000 UT) TABS Take 1 tablet by mouth once a week.  ? CLARAVIS 20 MG capsule Take 20 mg by mouth daily.  ? clonazePAM (KLONOPIN) 2 MG tablet 1 mg 2 (two) times daily.   ? Cyanocobalamin (VITAMIN B-12 PO) Take by mouth.  ? fenofibrate (TRICOR) 145 MG tablet Take 1 tablet (145 mg total) by mouth daily.  ? fexofenadine (ALLEGRA) 180 MG tablet Take 1 tablet (180 mg total) by mouth daily.  ? fluticasone (FLONASE) 50 MCG/ACT nasal spray SPRAY TWO SPRAYS IN EACH NOSTRIL ONCE DAILY  ? gabapentin (NEURONTIN) 600 MG tablet Take 1,200 mg by mouth 2 (two) times daily.   ? lamoTRIgine (LAMICTAL) 200 MG tablet Take 200 mg by mouth 2 (two) times daily.  ? losartan (COZAAR) 50 MG tablet Take 1 tablet (50 mg total) by mouth daily.  ? LUMIGAN 0.01 % SOLN Place 1 drop into both eyes at bedtime.   ? meloxicam (MOBIC) 15 MG tablet Take 1 tablet (15 mg total) by mouth daily.  ? metoprolol succinate (TOPROL-XL) 50 MG 24 hr tablet TAKE ONE TABLET BY MOUTH DAILY WITH A MEAL  ? montelukast (SINGULAIR) 10 MG tablet Take 1 tablet (10 mg total) by mouth at bedtime.  ? Multiple Vitamin (MULTIVITAMIN WITH MINERALS) TABS tablet Take 1 tablet by mouth daily.  ? OLANZapine (ZYPREXA) 5 MG tablet  Take 5 mg by mouth daily as needed.  ? omega-3 acid ethyl esters (LOVAZA) 1 g capsule TAKE TWO CAPSULES BY MOUTH TWICE A DAY  ? OVER THE COUNTER MEDICATION [place] tablets- 8.6 mg daily  ? Pyridoxine HCl (VITAMIN B-6 PO) Take 1 tablet by mouth daily.  ? QUEtiapine (SEROQUEL XR) 300 MG 24 hr tablet Take 2 tablets (600 mg total) by mouth at bedtime. (Patient taking differently: Take 600 mg by mouth at bedtime. Taking 1 400 mg tablet at bedtime, takes 50 mg daily as needed)  ? UNABLE TO FIND CPAP  ? venlafaxine (EFFEXOR-XR) 75 MG 24 hr capsule Take 75 mg  by mouth. Take 2 tabs in the am and 1 tab in afternoonn  ? ?No facility-administered encounter medications on file as of 03/12/2022.  ? ? ?Patient Active Problem List  ? Diagnosis Date Noted  ? Hx of adenomatous colonic polyps 07/2021  ? Sleep related hypoxia 12/17/2020  ? Cervical myelopathy ([place]) 10/16/2020  ? Elevated blood-pressure reading, without diagnosis of hypertension 10/16/2020  ? Lumbar radiculopathy 10/16/2020  ? Positive ANA (antinuclear antibody) 08/14/2020  ? Chronically dry eyes, bilateral 08/14/2020  ? Dry mouth 08/14/2020  ? Allergic rhinitis due to pollen 12/13/2018  ? Diet-controlled diabetes mellitus ([place]) 02/09/2018  ? Statin intolerance 11/04/2017  ? Family history of early CAD 03/10/2017  ? HTN (hypertension) 02/15/2017  ? Herniated nucleus pulposus, L5-S1 06/22/2014  ? Asthma, moderate persistent 10/09/2013  ? Obesity (BMI 30-39.9) 10/09/2013  ? Fatigue 12/12/2012  ? GERD (gastroesophageal reflux disease) 09/02/2012  ? Physical exam 07/03/2011  ? Vitamin D deficiency 09/15/2010  ? DEPRESSIVE DISORDER 07/17/2010  ? RHINITIS 06/14/2009  ? MUSCLE WEAKNESS (GENERALIZED) 01/03/2009  ? Hyperlipidemia 10/10/2008  ? BIPOLAR AFFECTIVE DISORDER 10/10/2008  ? ADD 10/10/2008  ? ? ?Conditions to be addressed/monitored:  Depression and Bipolar Disorder.  Limited Social Support, Mental Health Concerns, Social Isolation, and Lacks Knowledge of [employer]. ? ?Care Plan : LCSW Plan of Care  ?Updates made by Francis Gaines, LCSW since 03/12/2022 12:00 AM  ?  ? ?Problem: Reduce and Manage My Symptoms of Bipolar I Affective Disorder and Depression.   ?Priority: High  ?  ? ?Goal: Reduce and Manage My Symptoms of Bipolar I Affective Disorder and Depression.   ?Start Date: 02/26/2022  ?Expected End Date: 04/28/2022  ?This Visit's Progress: On track  ?Recent Progress: On track  ?Priority: High  ?Note:   ?Current Barriers:   ?Acute Mental Health needs related to Depressive Disorder, Bipolar I Affective  Disorder, and Attention Deficit Disorder, requires Support, Education, Resources, Referrals, Advocacy, and Care Coordination, in order to meet unmet Acute Mental Health needs. ?Clinical Goal(s):  ?Patient will work with LCSW, to reduce and manage symptoms of Depression and Bipolar I Affective Disorder, until re-established with mental health counseling and supportive services.       ?Patient will increase knowledge and/or ability of:  ?      Coping Skills, Healthy Habits, Self-Management Skills, Stress Reduction, Home Safety, and Utilizing [employer] and Resources.   ?Interventions: ?Collaboration with Primary Care Physician, Dr. Annye Asa regarding development and update of comprehensive plan of care, as evidenced by provider attestation and co-signature. ?Inter-disciplinary care team collaboration (see longitudinal plan of care). ?Clinical Interventions:  ?Mindfulness Meditation Strategies, Relaxation Techniques and Deep Breathing Exercises reviewed, and encouraged daily. ?Solution-Focused Therapy performed. ?Verbalization of Feelings encouraged. ?Emotional Support provided. ?Problem Solving Solutions developed. ?Cognitive Behavioral Therapy initiated. ?Patient Goals/Self-Care Activities: ?Continue to receive personal counseling  with LCSW, on a bi-weekly basis, to reduce and manage symptoms of Depression and Bipolar I Affective Disorder, until well-controlled.   ?Keep follow-up appointments with Psychiatrist, Dr. Brayton Caves with [facility] [phone]), for psychotropic medication management. ?Keep follow-up appointments with Nurse Practitioner, Janeth Rase with [facility] [phone]), for management of mood stabilization. ?Keep follow-up appointments with Licensed Clinical Social Worker, Jasmine Awe with [facility] [phone]), for psychotherapeutic counseling services.    ?Continue to incorporate into daily practice -  relaxation techniques, deep breathing exercises and mindfulness meditation strategies. ?Thorough review of EMMI educational material on "Depression in Adults", "Tips for How to Help Your Mood", and "Bipolar Disorder",

## 2022-03-13 ENCOUNTER — Encounter: Payer: Self-pay | Admitting: Family Medicine

## 2022-03-13 ENCOUNTER — Ambulatory Visit (INDEPENDENT_AMBULATORY_CARE_PROVIDER_SITE_OTHER): Payer: Medicare HMO | Admitting: Family Medicine

## 2022-03-13 VITALS — BP 130/82 | HR 69 | Temp 98.0°F | Resp 16 | Ht 69.0 in | Wt 221.6 lb

## 2022-03-13 DIAGNOSIS — E119 Type 2 diabetes mellitus without complications: Secondary | ICD-10-CM | POA: Diagnosis not present

## 2022-03-13 LAB — BASIC METABOLIC PANEL
BUN: 14 mg/dL (ref 6–23)
CO2: 25 mEq/L (ref 19–32)
Calcium: 9.6 mg/dL (ref 8.4–10.5)
Chloride: 102 mEq/L (ref 96–112)
Creatinine, Ser: 0.66 mg/dL (ref 0.40–1.20)
GFR: 92.6 mL/min (ref >60.00)
Glucose, Bld: 189 mg/dL — ABNORMAL HIGH (ref 70–99)
Potassium: 4 mEq/L (ref 3.5–5.1)
Sodium: 137 mEq/L (ref 135–145)

## 2022-03-13 LAB — HEMOGLOBIN A1C: Hgb A1c MFr Bld: 8.7 % — ABNORMAL HIGH (ref 4.6–6.5)

## 2022-03-13 MED ORDER — EMPAGLIFLOZIN 10 MG PO TABS: 10.0000 mg | ORAL_TABLET | Freq: Every day | ORAL | 3 refills | Status: DC

## 2022-03-13 NOTE — Progress Notes (Signed)
? ?  Subjective:  ? ? Patient ID: [name], female    DOB: August 06, 1956, 66 y.o.   MRN: [id number] ? ?HPI ?DM- chronic problem.  Pt reports she will wake up sweaty.  Sxs improved when she ate sugar before bed.  She's worried about eating sugar before bed b/c she doesn't feel this is a long term solution.  Last A1C 7.8%  Last 2 sugar readings have been ~200.  + fatigue.  Is heading out of town on Tuesday. ? ? ?Review of Systems ?For ROS see HPI  ?   ?Objective:  ? Physical Exam ?Vitals reviewed.  ?Constitutional:   ?   General: She is not in acute distress. ?   Appearance: Normal appearance. She is not ill-appearing or diaphoretic.  ?HENT:  ?   Head: Normocephalic and atraumatic.  ?Eyes:  ?   Extraocular Movements: Extraocular movements intact.  ?   Conjunctiva/sclera: Conjunctivae normal.  ?   Pupils: Pupils are equal, round, and reactive to light.  ?Cardiovascular:  ?   Rate and Rhythm: Normal rate and regular rhythm.  ?Pulmonary:  ?   Effort: Pulmonary effort is normal. No respiratory distress.  ?   Breath sounds: Normal breath sounds. No wheezing.  ?Skin: ?   General: Skin is warm and dry.  ?Neurological:  ?   General: No focal deficit present.  ?   Mental Status: She is alert and oriented to person, place, and time. Mental status is at baseline.  ?Psychiatric:     ?   Mood and Affect: Mood normal.     ?   Behavior: Behavior normal.     ?   Thought Content: Thought content normal.  ? ? ? ? ? ?   ?Assessment & Plan:  ? ? ?

## 2022-03-13 NOTE — Patient Instructions (Signed)
Follow up in 1 month to recheck sugar and how you're feeling ?We'll notify you of your lab results and make any changes if needed ?START the Jardiance once daily to help lower your sugars ?If you are still waking up sweating at night, try something like PB crackers to help prevent your sugar from dropping low ?Call with any questions or concerns ?Hang in there!!! ?Have an AMAZING trip!!! ?

## 2022-03-13 NOTE — Assessment & Plan Note (Signed)
Deteriorated.  Pt's CBGs have been running near 200.  Last A1C 7.8%.  Pt is now having relative hypoglycemia at night b/c sugars have been running high during the day.  She is asymptomatic if she eats sugar prior to bed- will not wake up sweating.  Reviewed relative hypoglycemia w/ pt.  Will start Jardiance to help lower her sugars and hopefully avoid symptomatic lows.  Discussed that if she is going to eat something prior to bed she needs to make sure she has protein and not just sugar to help stabilize her glucose.  Pt expressed understanding and is in agreement w/ plan.  ?

## 2022-03-15 ENCOUNTER — Encounter: Payer: Self-pay | Admitting: Family Medicine

## 2022-03-16 ENCOUNTER — Telehealth: Payer: Self-pay

## 2022-03-16 NOTE — Telephone Encounter (Signed)
-----   Message from Midge Minium, MD sent at 03/15/2022 11:13 AM EDT ----- ?Just as we suspected, your A1C is much higher at 8..7%  This means that your sugars have been running much too high and then dropping overnight.  The new Jardiance will help with this.  And continue to work on a low carb diet ?

## 2022-03-16 NOTE — Telephone Encounter (Signed)
Pt is calling in checking on this  ?

## 2022-03-16 NOTE — Telephone Encounter (Signed)
Spoke w/ pt and informed her of the lab results .  She expressed verbal understanding  ?

## 2022-03-17 ENCOUNTER — Other Ambulatory Visit: Payer: Self-pay

## 2022-03-17 DIAGNOSIS — J301 Allergic rhinitis due to pollen: Secondary | ICD-10-CM

## 2022-03-17 MED ORDER — FLUTICASONE PROPIONATE 50 MCG/ACT NA SUSP: NASAL | 3 refills | Status: DC

## 2022-03-23 ENCOUNTER — Telehealth: Payer: Self-pay | Admitting: Family Medicine

## 2022-03-23 DIAGNOSIS — E119 Type 2 diabetes mellitus without complications: Secondary | ICD-10-CM

## 2022-03-23 NOTE — Telephone Encounter (Signed)
Patient called stating that she attempted to make an appt with the diabetes and nutrition counselor but they are needing a new referral.  ?

## 2022-03-24 DIAGNOSIS — F3132 Bipolar disorder, current episode depressed, moderate: Secondary | ICD-10-CM | POA: Diagnosis not present

## 2022-03-24 DIAGNOSIS — R69 Illness, unspecified: Secondary | ICD-10-CM | POA: Diagnosis not present

## 2022-03-25 ENCOUNTER — Telehealth: Payer: Self-pay

## 2022-03-25 ENCOUNTER — Ambulatory Visit (INDEPENDENT_AMBULATORY_CARE_PROVIDER_SITE_OTHER): Payer: Medicare HMO | Admitting: Family Medicine

## 2022-03-25 ENCOUNTER — Encounter: Payer: Self-pay | Admitting: Family Medicine

## 2022-03-25 DIAGNOSIS — R300 Dysuria: Secondary | ICD-10-CM

## 2022-03-25 LAB — POCT URINALYSIS DIPSTICK
Bilirubin, UA: NEGATIVE
Blood, UA: NEGATIVE
Glucose, UA: POSITIVE — AB
Ketones, UA: NEGATIVE
Leukocytes, UA: NEGATIVE
Nitrite, UA: NEGATIVE
Protein, UA: NEGATIVE
Spec Grav, UA: 1.015 (ref 1.010–1.025)
Urobilinogen, UA: 0.2 E.U./dL
pH, UA: 7 (ref 5.0–8.0)

## 2022-03-25 LAB — POCT URINALYSIS DIP (MANUAL ENTRY)
Bilirubin, UA: NEGATIVE
Blood, UA: NEGATIVE
Glucose, UA: 250 mg/dL — AB
Ketones, POC UA: NEGATIVE mg/dL
Leukocytes, UA: NEGATIVE
Nitrite, UA: NEGATIVE
Protein Ur, POC: NEGATIVE mg/dL
Spec Grav, UA: 1.015 (ref 1.010–1.025)
Urobilinogen, UA: 0.2 E.U./dL
pH, UA: 7 (ref 5.0–8.0)

## 2022-03-25 MED ORDER — MICONAZOLE NITRATE 2 % VA CREA: TOPICAL_CREAM | VAGINAL | 1 refills | Status: DC

## 2022-03-25 MED ORDER — FLUCONAZOLE 150 MG PO TABS: ORAL_TABLET | ORAL | 0 refills | Status: DC

## 2022-03-25 NOTE — Addendum Note (Signed)
Addended by: Midge Minium on: 03/25/2022 07:15 AM ? ? Modules accepted: Orders ? ?

## 2022-03-25 NOTE — Telephone Encounter (Signed)
Referral placed.

## 2022-03-25 NOTE — Telephone Encounter (Signed)
Patient had a urinalysis and culture today. Medical Assistant, Marcille Blanco, collected urine and performed UA and entered results. Marcille Blanco took the urine cup to the lab and informed lab personnel, Jackelyn Poling F, that it needed a culture, order was placed.  ?

## 2022-03-25 NOTE — Progress Notes (Signed)
? ?  Subjective:  ? ? Patient ID: [name], female    DOB: 01-27-1956, 66 y.o.   MRN: [id number] ? ?HPI ?Dysuria- pt reports burning w/ urination.  Has burning of perineum.  Sxs started ~2-3 days ago.  Area feels raw.  No frequency or urgency.  No blood.  Mild itching.  Did just recently start [place] ? ? ?Review of Systems ?For ROS see HPI  ?   ?Objective:  ? Physical Exam ?Vitals reviewed.  ?Constitutional:   ?   General: She is not in acute distress. ?   Appearance: Normal appearance. She is not ill-appearing.  ?Skin: ?   General: Skin is warm and dry.  ?Neurological:  ?   General: No focal deficit present.  ?   Mental Status: She is alert and oriented to person, place, and time.  ?Psychiatric:     ?   Mood and Affect: Mood normal.     ?   Behavior: Behavior normal.     ?   Thought Content: Thought content normal.  ? ? ? ? ? ?   ?Assessment & Plan:  ?Dysuria- new.  Pt's sxs are consistent w/ fungal dermatitis due to the use of Jardiance rather than a UTI.  Will start Diflucan and Miconazole.  Pt instructed to keep area clean and dry to avoid recurrent infxns.   If this becomes a repeat issue, will need to re-evaluate the use of Jardiance.  Pt expressed understanding and is in agreement w/ plan.  ? ?

## 2022-03-25 NOTE — Patient Instructions (Signed)
Follow up as needed or as scheduled ?START the Diflucan as directed ?USE the Monistat cream twice daily as needed on the red, burning areas ?Call with any questions or concerns ?Hang in there!!! ?

## 2022-03-26 ENCOUNTER — Telehealth: Payer: Self-pay | Admitting: *Deleted

## 2022-03-26 ENCOUNTER — Telehealth: Payer: Medicare HMO

## 2022-03-26 LAB — URINE CULTURE
MICRO NUMBER:: 13410110
SPECIMEN QUALITY:: ADEQUATE

## 2022-03-26 NOTE — Telephone Encounter (Signed)
  Care Management   Follow Up Note   03/26/2022  Name: [name] MRN: [id number] DOB: [birth year]  Referred By: Midge Minium, MD  Reason for Referral:  Chronic Care Management Needs in Patient with HLD, HTN, DMII, GERD, Bipolar Affective Disorder, Attention Deficit Disorder, Depressive Disorder, Cervical Myelopathy, and Obesity.  An unsuccessful telephone outreach was attempted today. The patient was referred to the case management team for assistance with care management and care coordination. HIPAA compliant messages were left on voicemail for patient, providing contact information, encouraging her to return LCSW's call at her earliest convenience.  LCSW will make another initial telephone outreach call attempt within the next 7-10 business days, if a return call is not received from patient in the meantime.   Follow-Up Plan:  Request placed with Scheduling Care Guide to reschedule patient's initial telephone outreach call with LCSW.  Nat Christen LCSW Licensed Clinical Social Worker [place] [phone]

## 2022-03-27 ENCOUNTER — Telehealth: Payer: Self-pay

## 2022-03-27 ENCOUNTER — Telehealth: Payer: Self-pay | Admitting: *Deleted

## 2022-03-27 NOTE — Telephone Encounter (Signed)
Patient states she is still having headaches like when she found out she was diabetic. Patient would like to know should she keep taking Ibuprofen or could it be the Jardiance.

## 2022-03-27 NOTE — Telephone Encounter (Signed)
-----   Message from Midge Minium, MD sent at 03/27/2022  7:26 AM EDT ----- No evidence of UTI.  I hope you are feeling better!

## 2022-03-27 NOTE — Chronic Care Management (AMB) (Signed)
  Chronic Care Management Note  03/27/2022 Name: [name] MRN: [id number] DOB: 11-04-1956  [name] is a 66 y.o. year old female who is a primary care patient of Birdie Riddle, Aundra Millet, MD and is actively engaged with the care management team. I reached out to [employer] by phone today to assist with re-scheduling a follow up visit with the Licensed Clinical Social Worker  Follow up plan: Patient declines follow up with Licensed Clinical Social Worker at this time with the care management team, patient already working with another SW. Appropriate care team members and provider have been notified via electronic communication.  The patient has been provided with contact information for the care management team and has been advised to call with any health related questions or concerns. If patient returns call to provider office, please advise to call [place] at [phone].  [place] Management  Direct Dial: [phone]

## 2022-03-27 NOTE — Telephone Encounter (Signed)
Spoke to the pt and informed her that per DR Birdie Riddle to stop the Jardiance for a day or two they do not go away she will need to restart the Jardiance and then we will have to address the headaches . Pt expressed verbal understanding

## 2022-03-27 NOTE — Telephone Encounter (Signed)
It may be the Jardiance.  She can try stopping it for a day or 2 and see if the headaches improve.  If yes, it's likely the medication.  If they don't, she should restart the Jardiance and there's another reason for her headaches that we have to find

## 2022-03-27 NOTE — Telephone Encounter (Signed)
Spoke w/ pt and advised of urine culture results .

## 2022-04-02 ENCOUNTER — Other Ambulatory Visit: Payer: Self-pay | Admitting: Registered Nurse

## 2022-04-02 NOTE — Telephone Encounter (Signed)
Patient is requesting a refill of the following medications: Requested Prescriptions   Pending Prescriptions Disp Refills   gabapentin (NEURONTIN) 600 MG tablet [Pharmacy Med Name: GABAPENTIN 600 MG TABLET] 90 tablet     Sig: TAKE TWO TABLETS BY MOUTH THREE TIMES A DAY    Date of patient request: 04/02/2022 Last office visit: 03/25/2022 Date of last refill: n/a Last refill amount: n/a Follow up time period per chart:

## 2022-04-03 ENCOUNTER — Telehealth: Payer: Self-pay | Admitting: Family Medicine

## 2022-04-03 NOTE — Telephone Encounter (Signed)
She indicated she was not taking this medication in January and is not active on her med list.  I need more information (why she needs a refill, how long has she been off her medication, etc) b/c if she is starting over, this dose is not appropriate.  It's too high.

## 2022-04-03 NOTE — Telephone Encounter (Signed)
Pt called in asking for a new script on the Diflucan to be sent to [facility] on [address]  She states she gets them from time to time from taking the [place]

## 2022-04-06 MED ORDER — FLUCONAZOLE 150 MG PO TABS: ORAL_TABLET | ORAL | 0 refills | Status: DC

## 2022-04-06 NOTE — Telephone Encounter (Signed)
Prescription sent

## 2022-04-07 DIAGNOSIS — G4733 Obstructive sleep apnea (adult) (pediatric): Secondary | ICD-10-CM | POA: Diagnosis not present

## 2022-04-08 ENCOUNTER — Ambulatory Visit (INDEPENDENT_AMBULATORY_CARE_PROVIDER_SITE_OTHER): Payer: Medicare HMO | Admitting: Family Medicine

## 2022-04-08 ENCOUNTER — Encounter: Payer: Self-pay | Admitting: Family Medicine

## 2022-04-08 ENCOUNTER — Other Ambulatory Visit: Payer: Self-pay | Admitting: Internal Medicine

## 2022-04-08 ENCOUNTER — Telehealth: Payer: Medicare HMO

## 2022-04-08 VITALS — BP 126/78 | HR 78 | Temp 97.9°F | Resp 16 | Ht 69.0 in | Wt 218.8 lb

## 2022-04-08 DIAGNOSIS — E119 Type 2 diabetes mellitus without complications: Secondary | ICD-10-CM

## 2022-04-08 DIAGNOSIS — I10 Essential (primary) hypertension: Secondary | ICD-10-CM

## 2022-04-08 DIAGNOSIS — F3131 Bipolar disorder, current episode depressed, mild: Secondary | ICD-10-CM

## 2022-04-08 NOTE — Progress Notes (Signed)
   Subjective:    Patient ID: [name], female    DOB: 03/05/1956, 66 y.o.   MRN: [id number]  HPI DM- pt was started on Jardiance on 5/5 when A1C was 8.7%  Pt wants to know how long it takes medication to be effective.  Says she went 'off the rails' on her diet yesterday after 'working so hard'.  Pt had a symptomatic low while out the other day.  She had already had lunch.  Started sweating.  Got very nervous.  Sees Nutritionist on 6/6.   Review of Systems For ROS see HPI     Objective:   Physical Exam Vitals reviewed.  Constitutional:      General: She is not in acute distress.    Appearance: Normal appearance. She is not ill-appearing.  HENT:     Head: Normocephalic and atraumatic.  Skin:    General: Skin is warm and dry.  Neurological:     General: No focal deficit present.     Mental Status: She is alert and oriented to person, place, and time.  Psychiatric:        Mood and Affect: Mood normal.        Behavior: Behavior normal.        Thought Content: Thought content normal.          Assessment & Plan:

## 2022-04-08 NOTE — Assessment & Plan Note (Signed)
Chronic problem.  Pt has a hard time w/ change and this new medication has thrown her.  Reviewed that the medication is very safe and rarely causes lows unless she skips meals.  Reassured her that she is prepared for a low should it occur.  She agrees that some of what she is attributing to low blood sugar may actually be anxiety.  Discussed the idea of checking sugars but my concern is that pt will become obsessive about it.  She agrees.  Pt reports feeling better after talking things through

## 2022-04-08 NOTE — Patient Instructions (Signed)
Follow up as needed or as scheduled The medicine that you're on is very safe and doesn't cause too many lows The good news is that you're prepared for any lows that might occur Make sure you're carrying some peanut butter crackers just in case Drink LOTS of water Continue to work on healthy diet and regular exercise- you look great! Call with any questions or concerns Hang in there!!!

## 2022-04-14 ENCOUNTER — Ambulatory Visit (INDEPENDENT_AMBULATORY_CARE_PROVIDER_SITE_OTHER): Payer: Medicare HMO | Admitting: Registered Nurse

## 2022-04-14 ENCOUNTER — Telehealth: Payer: Self-pay | Admitting: Family Medicine

## 2022-04-14 ENCOUNTER — Encounter: Payer: Self-pay | Admitting: Skilled Nursing Facility1

## 2022-04-14 ENCOUNTER — Encounter: Payer: Medicare HMO | Attending: Family Medicine | Admitting: Skilled Nursing Facility1

## 2022-04-14 ENCOUNTER — Encounter: Payer: Self-pay | Admitting: Registered Nurse

## 2022-04-14 VITALS — BP 136/88 | HR 74 | Temp 98.2°F | Resp 16 | Ht 69.0 in | Wt 218.0 lb

## 2022-04-14 DIAGNOSIS — E119 Type 2 diabetes mellitus without complications: Secondary | ICD-10-CM | POA: Diagnosis not present

## 2022-04-14 LAB — POCT GLYCOSYLATED HEMOGLOBIN (HGB A1C): Hemoglobin A1C: 8.2 % — AB (ref 4.0–5.6)

## 2022-04-14 LAB — GLUCOSE, POCT (MANUAL RESULT ENTRY): POC Glucose: 123 mg/dl — AB (ref 70–99)

## 2022-04-14 NOTE — Telephone Encounter (Signed)
Patient went to Diabetic clinic today 04/14/22 and sugar was at 275- Patient stated she has not been eating healthy- stated she has officially left behind all candy bars and sweats-   Patient was recommended to have a glucose monitor/meter- would like office to call one in to her insurance.   Also mentioned if you would like to see her in office for an appointment or wait.- Patient is now feeling better- stated she was feeling dizzy at her diabetic follow up visit this am-

## 2022-04-14 NOTE — Assessment & Plan Note (Signed)
Improved. Encouraged her to continue her lifestyle modifications and medication compliance. Follow up as scheduled with PCP

## 2022-04-14 NOTE — Progress Notes (Signed)
[name] -  Wanted to keep you in the loop on Ms. Schechter' progress. She did have a hyperglycemic episode to 275 but overall improving. We did revisit glucose monitoring and wanted to get your thoughts on CGM.  Thanks,  [name]

## 2022-04-14 NOTE — Patient Instructions (Signed)
[name] to see you  Keep up the great work with diet and medication compliance  Follow up with [name] as scheduled  Thank you,  [name]

## 2022-04-14 NOTE — Progress Notes (Signed)
   Diabetes Self-Management Education  Visit Type:     04/14/2022  [name], identified by name and date of birth, is a 66 y.o. female with a diagnosis of Diabetes:  .   ASSESSMENT   History includes:  Type 2 Diabetes, HTN, HLD, GERD, OSA on c-pap, constipation, GERD, bipolar. A1C 8.7% 03/13/2022  Medications includes Jardiance    Patient is here today alone.  She was last seen this RD 04/14/2022   Pt states last night she Ate a bunch of crackers and butter feeling bad currently so checked her blood sugar which was 275 having eaten oatmeal about 2.5 hours previous having eaten plan oatmeal Pt states this reading explains some things because she has been feeling really tired all the time.   Pt states she does not check her blood sugars.  Pt state she does check her feet daily.   Pt states she only eats complex crackers and bread. Pt states she does not eat candy or ice cream. Pt states she has not binge ate in years other than last night and is not really sure where that came from.  Pt states she takes her bipolar daily.   Goals: Call your doctor for a glucometer and supplies Start checking your blood sugar daily at least once a day: in the morning before you eat (under 130) or drink anything or 2 hours after any meal (under 180) (switch that up throughout the week) Start walking 30 minutes daily    DM medications: Jardiance    Patient lives with her dog.  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  She worked for [employer] prior to being downsized. She struggles with finances at times. She struggles with standing for long periods of time.   She has gone to a therapist but has not gone for a while but does have an appt at some point.  She states that it helps when she talks with her cousin.  Being alone a lot causes her stress.  She states that she needs more structure. She enjoys playing guitar and singing.  She writes her own songs.

## 2022-04-14 NOTE — Progress Notes (Signed)
Established Patient Office Visit  Subjective:  Patient ID: [name], female    DOB: [birth year]  Age: 66 y.o. MRN: [id number]  CC:  Chief Complaint  Patient presents with   Hyperglycemia    Pt states that her sugar is high and needs to be tested.     HPI Jeris J Guyette presents for hyperglycemia  She has been following up on this with [name], her PCP. She is nervous about hypoglycemia with some medications, however, has been running high at home Would like to check here.  Last night sugars at 275 after crackers and butter.   Her last A1c was here on 03/13/22 with result of 8.7.  Feeling much better now. Acknowledges role of anxiety in her concerns around blood sugars.   Outpatient Medications Prior to Visit  Medication Sig Dispense Refill   Alirocumab (PRALUENT) 150 MG/ML SOAJ INJECT '150MG'$  INTO THE SKIN EVERY 14 DAYS 2 mL 11   ALPHAGAN P 0.1 % SOLN Place 1 drop into both eyes every 8 (eight) hours.      azelastine (ASTELIN) 0.1 % nasal spray PLACE 2 SPRAYS INTO EACH NOSTRIL TWO TIMES A DAY AS DIRECTED 30 mL 2   budesonide-formoterol (SYMBICORT) 160-4.5 MCG/ACT inhaler INHALE TWO PUFFS BY MOUTH EVERY MORNING AND INHALE TWO PUFFS BY MOUTH EVERY NIGHT AT BEDTIME 10.2 g 0   Cholecalciferol (VITAMIN D3) 50 MCG (2000 UT) CAPS Take 2,000 Units by mouth daily.     Cholecalciferol 1.25 MG (50000 UT) TABS Take 1 tablet by mouth once a week. 4 tablet 2   CLARAVIS 20 MG capsule Take 20 mg by mouth daily.     clonazePAM (KLONOPIN) 2 MG tablet 1 mg 2 (two) times daily.   0   Cyanocobalamin (VITAMIN B-12 PO) Take by mouth.     empagliflozin (JARDIANCE) 10 MG TABS tablet Take 1 tablet (10 mg total) by mouth daily before breakfast. 30 tablet 3   fenofibrate (TRICOR) 145 MG tablet Take 1 tablet (145 mg total) by mouth daily. 90 tablet 1   fexofenadine (ALLEGRA) 180 MG tablet Take 1 tablet (180 mg total) by mouth daily. 90 tablet 3   fluconazole (DIFLUCAN) 150 MG tablet Take 1 tab now and  take the 2nd tab in 3 days 2 tablet 0   fluticasone (FLONASE) 50 MCG/ACT nasal spray SPRAY TWO SPRAYS IN EACH NOSTRIL ONCE DAILY 16 g 3   gabapentin (NEURONTIN) 600 MG tablet Take 1,200 mg by mouth 2 (two) times daily.      lamoTRIgine (LAMICTAL) 200 MG tablet Take 200 mg by mouth 2 (two) times daily.     losartan (COZAAR) 50 MG tablet Take 1 tablet (50 mg total) by mouth daily. 30 tablet 0   LUMIGAN 0.01 % SOLN Place 1 drop into both eyes at bedtime.      meloxicam (MOBIC) 15 MG tablet Take 1 tablet (15 mg total) by mouth daily. 30 tablet 3   metoprolol succinate (TOPROL-XL) 50 MG 24 hr tablet TAKE ONE TABLET BY MOUTH DAILY WITH A MEAL 90 tablet 2   miconazole (MONISTAT 7) 2 % vaginal cream Apply to red, burning areas twice daily as needed 45 g 1   montelukast (SINGULAIR) 10 MG tablet Take 1 tablet (10 mg total) by mouth at bedtime. 90 tablet 1   Multiple Vitamin (MULTIVITAMIN WITH MINERALS) TABS tablet Take 1 tablet by mouth daily.     OLANZapine (ZYPREXA) 5 MG tablet Take 5 mg by mouth daily as  needed.     omega-3 acid ethyl esters (LOVAZA) 1 g capsule TAKE TWO CAPSULES BY MOUTH TWICE A DAY 120 capsule 10   OVER THE COUNTER MEDICATION Senna tablets- 8.6 mg daily     Pyridoxine HCl (VITAMIN B-6 PO) Take 1 tablet by mouth daily.     QUEtiapine (SEROQUEL XR) 300 MG 24 hr tablet Take 2 tablets (600 mg total) by mouth at bedtime. (Patient taking differently: Take 600 mg by mouth at bedtime. Taking 1 400 mg tablet at bedtime, takes 50 mg daily as needed) 60 tablet 1   timolol (TIMOPTIC) 0.5 % ophthalmic solution SMARTSIG:In Eye(s)     UNABLE TO FIND CPAP     venlafaxine (EFFEXOR-XR) 75 MG 24 hr capsule Take 75 mg by mouth. Take 2 tabs in the am and 1 tab in afternoonn     No facility-administered medications prior to visit.    Review of Systems  Constitutional: Negative.   HENT: Negative.    Eyes: Negative.   Respiratory: Negative.    Cardiovascular: Negative.   Gastrointestinal: Negative.    Genitourinary: Negative.   Musculoskeletal: Negative.   Skin: Negative.   Neurological: Negative.   Psychiatric/Behavioral: Negative.    All other systems reviewed and are negative.    Objective:     BP 136/88   Pulse 74   Temp 98.2 F (36.8 C) (Temporal)   Resp 16   Ht '5\' 9"'$  (1.753 m)   Wt 218 lb (98.9 kg)   LMP 11/09/2000   SpO2 94%   BMI 32.19 kg/m   Wt Readings from Last 3 Encounters:  04/14/22 218 lb (98.9 kg)  04/08/22 218 lb 12.8 oz (99.2 kg)  03/25/22 216 lb 6.4 oz (98.2 kg)   Physical Exam Vitals and nursing note reviewed.  Constitutional:      General: She is not in acute distress.    Appearance: Normal appearance. She is normal weight. She is not ill-appearing, toxic-appearing or diaphoretic.  Cardiovascular:     Rate and Rhythm: Normal rate and regular rhythm.     Heart sounds: Normal heart sounds. No murmur heard.   No friction rub. No gallop.  Pulmonary:     Effort: Pulmonary effort is normal. No respiratory distress.     Breath sounds: Normal breath sounds. No stridor. No wheezing, rhonchi or rales.  Chest:     Chest wall: No tenderness.  Skin:    General: Skin is warm and dry.  Neurological:     General: No focal deficit present.     Mental Status: She is alert and oriented to person, place, and time. Mental status is at baseline.  Psychiatric:        Mood and Affect: Mood normal.        Behavior: Behavior normal.        Thought Content: Thought content normal.        Judgment: Judgment normal.    Results for orders placed or performed in visit on 04/14/22  POCT HgB A1C  Result Value Ref Range   Hemoglobin A1C 8.2 (A) 4.0 - 5.6 %   HbA1c POC (<> result, manual entry)     HbA1c, POC (prediabetic range)     HbA1c, POC (controlled diabetic range)    POCT glucose (manual entry)  Result Value Ref Range   POC Glucose 123 (A) 70 - 99 mg/dl      The 10-year ASCVD risk score (Arnett DK, et al., 2019) is: 12.1%    Assessment &  Plan:    Problem List Items Addressed This Visit       Endocrine   Diabetes mellitus without complication ([place]) - Primary    Improved. Encouraged her to continue her lifestyle modifications and medication compliance. Follow up as scheduled with PCP       Relevant Orders   POCT HgB A1C (Completed)   POCT glucose (manual entry) (Completed)    No orders of the defined types were placed in this encounter.   Return if symptoms worsen or fail to improve.     Maximiano Coss, NP

## 2022-04-15 ENCOUNTER — Other Ambulatory Visit: Payer: Self-pay

## 2022-04-15 DIAGNOSIS — E1165 Type 2 diabetes mellitus with hyperglycemia: Secondary | ICD-10-CM

## 2022-04-15 MED ORDER — GLUCOSE BLOOD VI STRP: ORAL_STRIP | 12 refills | Status: DC

## 2022-04-15 MED ORDER — BLOOD GLUCOSE MONITOR KIT: PACK | 0 refills | Status: AC

## 2022-04-15 MED ORDER — ONETOUCH ULTRASOFT LANCETS MISC: 12 refills | Status: DC

## 2022-04-15 NOTE — Telephone Encounter (Signed)
Ok to send in prescription for preferred glucometer, test strips, lancets.  Be sure to include dx of uncontrolled DM II on the prescriptions

## 2022-04-15 NOTE — Telephone Encounter (Signed)
Glucometer and strips sent pharmacy

## 2022-04-16 ENCOUNTER — Telehealth: Payer: Self-pay | Admitting: Dietician

## 2022-04-16 NOTE — Telephone Encounter (Signed)
Patient called with a question. She states that the other night she binged on a sleeve of crackers and butter which resulted in a glucose of 275.   Discussed reasons for binges such as not eating adequately throughout the day or eating for emotion. Patient also stated her NP had suggested a CGM.  Discussed the Elenor Legato which is compatible with her phone and she will call when she gets this to obtain training.  She is drinking only water and wishes for idea about soda.  Discussed Zevia soda as an option.  She would like to drink coffee and used coffee mate in the past.  Discussed to choose something low in saturated fat.  Patient to call for questions, training and to follow up with this RD in the future.  Antonieta Iba, RD, LDN, CDCES

## 2022-04-17 ENCOUNTER — Encounter: Payer: Self-pay | Admitting: Dietician

## 2022-04-17 ENCOUNTER — Encounter: Payer: Medicare HMO | Admitting: Dietician

## 2022-04-17 NOTE — Progress Notes (Signed)
Patient is here today as a walk in to learn how to use her blood glucose meter.  Patient brought her One Touch meter.  She was able to demonstrate use and glucose was 126 after lunch.  Patient with no further questions.  Antonieta Iba, RD, LDN, CDCES

## 2022-04-20 ENCOUNTER — Other Ambulatory Visit: Payer: Self-pay

## 2022-04-20 ENCOUNTER — Telehealth: Payer: Self-pay

## 2022-04-20 DIAGNOSIS — E782 Mixed hyperlipidemia: Secondary | ICD-10-CM

## 2022-04-20 MED ORDER — ONETOUCH DELICA PLUS LANCET30G MISC: 1 refills | Status: DC

## 2022-04-20 MED ORDER — LOSARTAN POTASSIUM 50 MG PO TABS: 50.0000 mg | ORAL_TABLET | Freq: Every day | ORAL | 0 refills | Status: DC

## 2022-04-20 NOTE — Telephone Encounter (Signed)
Prescription sent to pharmacy.

## 2022-04-22 ENCOUNTER — Ambulatory Visit (INDEPENDENT_AMBULATORY_CARE_PROVIDER_SITE_OTHER): Payer: Medicare HMO

## 2022-04-22 DIAGNOSIS — I10 Essential (primary) hypertension: Secondary | ICD-10-CM

## 2022-04-22 DIAGNOSIS — E1165 Type 2 diabetes mellitus with hyperglycemia: Secondary | ICD-10-CM

## 2022-04-22 DIAGNOSIS — E782 Mixed hyperlipidemia: Secondary | ICD-10-CM

## 2022-04-22 NOTE — Chronic Care Management (AMB) (Signed)
Chronic Care Management   CCM RN Visit Note  04/22/2022 Name: [name] MRN: [id number] DOB: 11/25/1955  Subjective: [name] is a 66 y.o. year old female who is a primary care patient of Tabori, Aundra Millet, MD. The care management team was consulted for assistance with disease management and care coordination needs.    Engaged with patient by telephone for follow up visit in response to provider referral for case management and/or care coordination services.   Consent to Services:  The patient was given information about Chronic Care Management services, agreed to services, and gave verbal consent prior to initiation of services.  Please see initial visit note for detailed documentation.   Patient agreed to services and verbal consent obtained.   Assessment: Review of patient past medical history, allergies, medications, health status, including review of consultants reports, laboratory and other test data, was performed as part of comprehensive evaluation and provision of chronic care management services.   SDOH (Social Determinants of Health) assessments and interventions performed:    CCM Care Plan  Allergies  Allergen Reactions   Nitrofurantoin Monohyd Macro Nausea Only    Severe headache   Statins Other (See Comments)    Severe weakness, pain   Alprazolam Anxiety and Other (See Comments)    Hyper    Amoxicillin Rash   Prednisone Anxiety   Sulfa Antibiotics Nausea And Vomiting    Outpatient Encounter Medications as of 04/22/2022  Medication Sig   Alirocumab (PRALUENT) 150 MG/ML SOAJ INJECT 150MG INTO THE SKIN EVERY 14 DAYS   ALPHAGAN P 0.1 % SOLN Place 1 drop into both eyes every 8 (eight) hours.    azelastine (ASTELIN) 0.1 % nasal spray PLACE 2 SPRAYS INTO EACH NOSTRIL TWO TIMES A DAY AS DIRECTED   blood glucose meter kit and supplies KIT Dispense based on patient and insurance preference. Use up to four times daily as directed.   budesonide-formoterol  (SYMBICORT) 160-4.5 MCG/ACT inhaler INHALE TWO PUFFS BY MOUTH EVERY MORNING AND INHALE TWO PUFFS BY MOUTH EVERY NIGHT AT BEDTIME   Cholecalciferol (VITAMIN D3) 50 MCG (2000 UT) CAPS Take 2,000 Units by mouth daily.   Cholecalciferol 1.25 MG (50000 UT) TABS Take 1 tablet by mouth once a week.   CLARAVIS 20 MG capsule Take 20 mg by mouth daily.   clonazePAM (KLONOPIN) 2 MG tablet 1 mg 2 (two) times daily.    Cyanocobalamin (VITAMIN B-12 PO) Take by mouth.   empagliflozin (JARDIANCE) 10 MG TABS tablet Take 1 tablet (10 mg total) by mouth daily before breakfast.   fenofibrate (TRICOR) 145 MG tablet Take 1 tablet (145 mg total) by mouth daily.   fexofenadine (ALLEGRA) 180 MG tablet Take 1 tablet (180 mg total) by mouth daily.   fluconazole (DIFLUCAN) 150 MG tablet Take 1 tab now and take the 2nd tab in 3 days   fluticasone (FLONASE) 50 MCG/ACT nasal spray SPRAY TWO SPRAYS IN EACH NOSTRIL ONCE DAILY   gabapentin (NEURONTIN) 600 MG tablet Take 1,200 mg by mouth 2 (two) times daily.    glucose blood test strip Use as instructed   lamoTRIgine (LAMICTAL) 200 MG tablet Take 200 mg by mouth 2 (two) times daily.   Lancets (ONETOUCH DELICA PLUS [id number]) MISC Use twice daily to test blood sugars as directed Dx- uncontrolled DM II   losartan (COZAAR) 50 MG tablet Take 1 tablet (50 mg total) by mouth daily.   LUMIGAN 0.01 % SOLN Place 1 drop into both eyes at bedtime.  meloxicam (MOBIC) 15 MG tablet Take 1 tablet (15 mg total) by mouth daily.   metoprolol succinate (TOPROL-XL) 50 MG 24 hr tablet TAKE ONE TABLET BY MOUTH DAILY WITH A MEAL   miconazole (MONISTAT 7) 2 % vaginal cream Apply to red, burning areas twice daily as needed   montelukast (SINGULAIR) 10 MG tablet Take 1 tablet (10 mg total) by mouth at bedtime.   Multiple Vitamin (MULTIVITAMIN WITH MINERALS) TABS tablet Take 1 tablet by mouth daily.   OLANZapine (ZYPREXA) 5 MG tablet Take 5 mg by mouth daily as needed.   omega-3 acid ethyl esters  (LOVAZA) 1 g capsule TAKE TWO CAPSULES BY MOUTH TWICE A DAY   OVER THE COUNTER MEDICATION Senna tablets- 8.6 mg daily   Pyridoxine HCl (VITAMIN B-6 PO) Take 1 tablet by mouth daily.   QUEtiapine (SEROQUEL XR) 300 MG 24 hr tablet Take 2 tablets (600 mg total) by mouth at bedtime. (Patient taking differently: Take 600 mg by mouth at bedtime. Taking 1 400 mg tablet at bedtime, takes 50 mg daily as needed)   timolol (TIMOPTIC) 0.5 % ophthalmic solution SMARTSIG:In Eye(s)   UNABLE TO FIND CPAP   venlafaxine (EFFEXOR-XR) 75 MG 24 hr capsule Take 75 mg by mouth. Take 2 tabs in the am and 1 tab in afternoonn   No facility-administered encounter medications on file as of 04/22/2022.    Patient Active Problem List   Diagnosis Date Noted   Hx of adenomatous colonic polyps 07/2021   Sleep related hypoxia 12/17/2020   Cervical myelopathy ([place]) 10/16/2020   Elevated blood-pressure reading, without diagnosis of hypertension 10/16/2020   Lumbar radiculopathy 10/16/2020   Positive ANA (antinuclear antibody) 08/14/2020   Chronically dry eyes, bilateral 08/14/2020   Dry mouth 08/14/2020   Allergic rhinitis due to pollen 12/13/2018   Diabetes mellitus without complication ([place]) 78/29/5621   Statin intolerance 11/04/2017   Family history of early CAD 03/10/2017   HTN (hypertension) 02/15/2017   Herniated nucleus pulposus, L5-S1 06/22/2014   Asthma, moderate persistent 10/09/2013   Obesity (BMI 30-39.9) 10/09/2013   Fatigue 12/12/2012   GERD (gastroesophageal reflux disease) 09/02/2012   Physical exam 07/03/2011   Vitamin D deficiency 09/15/2010   DEPRESSIVE DISORDER 07/17/2010   RHINITIS 06/14/2009   MUSCLE WEAKNESS (GENERALIZED) 01/03/2009   Hyperlipidemia 10/10/2008   BIPOLAR AFFECTIVE DISORDER 10/10/2008   ADD 10/10/2008    Conditions to be addressed/monitored:HTN, HLD, and DMII  Care Plan : RN Care Manager Plan of Care  Updates made by Dimitri Ped, RN since 04/22/2022 12:00 AM      Problem: Chronic Disease Management and Care Coordination Needs (DM,HTN ,HLD, Vitamin D deficiency)   Priority: High     Long-Range Goal: Establish Plan of Care for Chronic Disease Management Needs (DM,HTN ,HLD, Vitamin D deficiency)   Start Date: 02/18/2022  Expected End Date: 02/18/2023  Priority: High  Note:   Current Barriers:  Knowledge Deficits related to plan of care for management of HTN, HLD, DMII, Bipolar Disorder, and Vitamin D Deficiency    Care Coordination needs related to Limited social support and Mental Health Concerns  Chronic Disease Management support and education needs related to HTN, HLD, DMII, Bipolar Disorder, and  Vitamin D Deficiency   States that meeting with the RD has been helpful.  States she is now starting to check her CBGs but she is still learning how to do them. States that the RD showed her how to use the glucometer but she is not sure  she is doing it right.  States her reading was 155 this morning. States she has been trying to cut out sweets but it is struggle at times.  States she does walk her dog 30 minutes a day and she wants to start going to the gym.  States she is having issues with stress and coping with her illness and will see her therapist.   Denies and chest pains, shortness of breath or swelling.  States she does not been checking her B/P at home.  RNCM Clinical Goal(s):  Patient will verbalize understanding of plan for management of HTN, HLD, DMII, Bipolar Disorder, and  Vitamin D Deficiency  as evidenced by voiced adherence to plan of care verbalize basic understanding of  HTN, HLD, DMII, Bipolar Disorder, and  Vitamin D Deficiency  disease process and self health management plan as evidenced by voiced understanding and teach back  take all medications exactly as prescribed and will call provider for medication related questions as evidenced by dispense report and pt verbalization  attend all scheduled medical appointments: RD 06/08/22,  Neurology 09/09/22 as evidenced by medical records demonstrate Improved adherence to prescribed treatment plan for HTN, HLD, DMII, Bipolar Disorder, and  Vitamin D Deficiency  as evidenced by readings within limits, voiced adherence to plan of care continue to work with RN Care Manager to address care management and care coordination needs related to  HTN, HLD, DMII, Bipolar Disorder, and  Vitamin D Deficiency  as evidenced by adherence to CM Team Scheduled appointments work with Education officer, museum to address  related to the management of Limited social support, Mental Health Concerns , and stress of illnesses related to the management of HTN, HLD, DMII, Bipolar Disorder, and  Vitamin D Deficiency  as evidenced by review of EMR and patient or social worker report through collaboration with Consulting civil engineer, provider, and care team.   Interventions: 1:1 collaboration with primary care provider regarding development and update of comprehensive plan of care as evidenced by provider attestation and co-signature Inter-disciplinary care team collaboration (see longitudinal plan of care) Evaluation of current treatment plan related to  self management and patient's adherence to plan as established by provider [place]  calendar and  Booklet: How to Thrive: A Guide for Your Journey with diabetes    Diabetes Interventions:  (Status:  Goal on track:  Yes.) Long Term Goal Assessed patient's understanding of A1c goal: <7% Provided education to patient about basic DM disease process Reviewed medications with patient and discussed importance of medication adherence Counseled on importance of regular laboratory monitoring as prescribed Discussed plans with patient for ongoing care management follow up and provided patient with direct contact information for care management team Provided patient with written educational materials related to hypo and hyperglycemia and importance of correct  treatment Advised patient, providing education and rationale, to check cbg 1-2 times a day and record, calling provider for findings outside established parameters Review of patient status, including review of consultants reports, relevant laboratory and other test results, and medications completed Reviewed use of glucometer and how to get a good fingerstick specimen. Reviewed fasting blood sugar goals of 80-130 and less than 180 1 1/2-2 hours after meals. Reviewed is read labels for CHO count and reviewed free phone apps to help count CHO   Lab Results  Component Value Date   HGBA1C 8.2 (A) 04/14/2022    Vitamin D Deficiency   (Status:  Goal on track:  Yes.)  Long Term Goal  Evaluation of current treatment plan related to   Vitamin D Deficiency  , Limited social support and Mental Health Concerns  self-management and patient's adherence to plan as established by provider. Discussed plans with patient for ongoing care management follow up and provided patient with direct contact information for care management team Evaluation of current treatment plan related to  Vitamin D Deficiency  and patient's adherence to plan as established by provider Advised patient to eat foods with higher vitamin D and get sunlight Provided education to patient re:  Vitamin D Deficiency  Reviewed medications with patient and discussed prescribed higher dose of vitamin D Discussed plans with patient for ongoing care management follow up and provided patient with direct contact information for care management team Reviewed to get sunlight exposure regularly  Hyperlipidemia Interventions:  (Status:  Goal on track:  Yes.) Long Term Goal Medication review performed; medication list updated in electronic medical record.  Provider established cholesterol goals reviewed Counseled on importance of regular laboratory monitoring as prescribed Reviewed importance of limiting foods high in cholesterol Reviewed exercise goals and  target of 150 minutes per week Reviewed to continue walking and to try to get to gym  Hypertension Interventions:  (Status:  Goal on track:  Yes.) Long Term Goal Last practice recorded BP readings:  BP Readings from Last 3 Encounters:  04/14/22 136/88  04/08/22 126/78  03/25/22 112/82  Most recent eGFR/CrCl:  Lab Results  Component Value Date   EGFR 93 09/08/2021    No components found for: CRCL  Evaluation of current treatment plan related to hypertension self management and patient's adherence to plan as established by provider Provided education to patient re: stroke prevention, s/s of heart attack and stroke Reviewed medications with patient and discussed importance of compliance Counseled on the importance of exercise goals with target of 150 minutes per week Discussed plans with patient for ongoing care management follow up and provided patient with direct contact information for care management team Advised patient, providing education and rationale, to monitor blood pressure daily and record, calling PCP for findings outside established parameters Provided education on prescribed diet low sodium low CHO  Patient Goals/Self-Care Activities: Take all medications as prescribed Attend all scheduled provider appointments Call pharmacy for medication refills 3-7 days in advance of running out of medications Perform all self care activities independently  Call provider office for new concerns or questions  keep appointment with eye doctor check blood sugar at prescribed times: twice daily and when you have symptoms of low or high blood sugar check feet daily for cuts, sores or redness take the blood sugar log to all doctor visits drink 6 to 8 glasses of water each day eat fish at least once per week fill half of plate with vegetables limit fast food meals to no more than 1 per week manage portion size switch to sugar-free drinks check blood pressure weekly choose a place to  take my blood pressure (home, clinic or office, retail store) take blood pressure log to all doctor appointments eat more whole grains, fruits and vegetables, lean meats and healthy fats limit salt intake to 2337m/day call for medicine refill 2 or 3 days before it runs out take all medications exactly as prescribed call doctor with any symptoms you believe are related to your medicine call doctor when you experience any new symptoms  Follow Up Plan:  Telephone follow up appointment with care management team member scheduled for:  05/27/22 The patient has been provided with  contact information for the care management team and has been advised to call with any health related questions or concerns.       Plan:Telephone follow up appointment with care management team member scheduled for:  05/27/22 The patient has been provided with contact information for the care management team and has been advised to call with any health related questions or concerns.  Peter Garter RN, Jackquline [place], CDE Care Management Coordinator [facility] [phone]

## 2022-04-22 NOTE — Patient Instructions (Signed)
Visit Information  Thank you for taking time to visit with me today. Please don't hesitate to contact me if I can be of assistance to you before our next scheduled telephone appointment.  Following are the goals we discussed today:  Take all medications as prescribed Attend all scheduled provider appointments Call pharmacy for medication refills 3-7 days in advance of running out of medications Perform all self care activities independently  Call provider office for new concerns or questions  keep appointment with eye doctor check blood sugar at prescribed times: twice daily and when you have symptoms of low or high blood sugar check feet daily for cuts, sores or redness take the blood sugar log to all doctor visits drink 6 to 8 glasses of water each day eat fish at least once per week fill half of plate with vegetables limit fast food meals to no more than 1 per week manage portion size switch to sugar-free drinks check blood pressure weekly choose a place to take my blood pressure (home, clinic or office, retail store) take blood pressure log to all doctor appointments eat more whole grains, fruits and vegetables, lean meats and healthy fats limit salt intake to '2300mg'$ /day call for medicine refill 2 or 3 days before it runs out take all medications exactly as prescribed call doctor with any symptoms you believe are related to your medicine call doctor when you experience any new symptoms  Our next appointment is by telephone on 05/27/22 at 3 PM  Please call the care guide team at [phone] if you need to cancel or reschedule your appointment.   If you are experiencing a Mental Health or [place] or need someone to talk to, please call the Suicide and Crisis Lifeline: 988 call the [place] National Suicide Prevention Lifeline: 712-243-3725 or TTY: 5415956781 TTY 458-097-4483) to talk to a trained counselor call 1-800-273-TALK (toll free, 24 hour hotline) go to  [facility] [address] [place] [phone]) call 911   Patient verbalizes understanding of instructions and care plan provided today and agrees to view in [place]. Active MyChart status and patient understanding of how to access instructions and care plan via MyChart confirmed with patient.     Peter Garter RN, Jackquline Denmark, CDE Care Management Coordinator [facility] [phone]

## 2022-04-24 ENCOUNTER — Encounter: Payer: Self-pay | Admitting: Family Medicine

## 2022-04-28 DIAGNOSIS — L718 Other rosacea: Secondary | ICD-10-CM | POA: Diagnosis not present

## 2022-05-04 ENCOUNTER — Ambulatory Visit (INDEPENDENT_AMBULATORY_CARE_PROVIDER_SITE_OTHER): Payer: Medicare HMO | Admitting: Family Medicine

## 2022-05-04 ENCOUNTER — Telehealth: Payer: Self-pay

## 2022-05-04 ENCOUNTER — Encounter: Payer: Self-pay | Admitting: Family Medicine

## 2022-05-04 ENCOUNTER — Telehealth: Payer: Self-pay | Admitting: Family Medicine

## 2022-05-04 VITALS — BP 130/82 | HR 72 | Temp 97.0°F | Resp 16 | Ht 69.0 in | Wt 207.5 lb

## 2022-05-04 DIAGNOSIS — R6889 Other general symptoms and signs: Secondary | ICD-10-CM

## 2022-05-04 DIAGNOSIS — E1100 Type 2 diabetes mellitus with hyperosmolarity without nonketotic hyperglycemic-hyperosmolar coma (NKHHC): Secondary | ICD-10-CM | POA: Diagnosis not present

## 2022-05-04 DIAGNOSIS — E1165 Type 2 diabetes mellitus with hyperglycemia: Secondary | ICD-10-CM

## 2022-05-04 DIAGNOSIS — R6883 Chills (without fever): Secondary | ICD-10-CM

## 2022-05-04 LAB — CBC WITH DIFFERENTIAL/PLATELET
Basophils Absolute: 0 10*3/uL (ref 0.0–0.1)
Basophils Relative: 0.5 % (ref 0.0–3.0)
Eosinophils Absolute: 0 10*3/uL (ref 0.0–0.7)
Eosinophils Relative: 0 % (ref 0.0–5.0)
HCT: 39.3 % (ref 36.0–46.0)
Hemoglobin: 13.4 g/dL (ref 12.0–15.0)
Lymphocytes Relative: 39.9 % (ref 12.0–46.0)
Lymphs Abs: 1.2 10*3/uL (ref 0.7–4.0)
MCHC: 34 g/dL (ref 30.0–36.0)
MCV: 90.7 fl (ref 78.0–100.0)
Monocytes Absolute: 0.2 10*3/uL (ref 0.1–1.0)
Monocytes Relative: 5.8 % (ref 3.0–12.0)
Neutro Abs: 1.6 10*3/uL (ref 1.4–7.7)
Neutrophils Relative %: 53.8 % (ref 43.0–77.0)
Platelets: 222 10*3/uL (ref 150.0–400.0)
RBC: 4.33 Mil/uL (ref 3.87–5.11)
RDW: 13.6 % (ref 11.5–15.5)
WBC: 2.9 10*3/uL — ABNORMAL LOW (ref 4.0–10.5)

## 2022-05-04 LAB — BASIC METABOLIC PANEL
BUN: 13 mg/dL (ref 6–23)
CO2: 27 mEq/L (ref 19–32)
Calcium: 10.1 mg/dL (ref 8.4–10.5)
Chloride: 104 mEq/L (ref 96–112)
Creatinine, Ser: 0.83 mg/dL (ref 0.40–1.20)
GFR: 74.34 mL/min (ref >60.00)
Glucose, Bld: 149 mg/dL — ABNORMAL HIGH (ref 70–99)
Potassium: 4.3 mEq/L (ref 3.5–5.1)
Sodium: 141 mEq/L (ref 135–145)

## 2022-05-04 LAB — TSH: TSH: 1.46 u[IU]/mL (ref 0.35–5.50)

## 2022-05-04 MED ORDER — SITAGLIPTIN PHOSPHATE 100 MG PO TABS: 100.0000 mg | ORAL_TABLET | Freq: Every day | ORAL | 3 refills | Status: DC

## 2022-05-04 NOTE — Telephone Encounter (Signed)
Spoke w/ pt and advised her to call her insurance company and see what all they will cover and the pharmacy

## 2022-05-04 NOTE — Telephone Encounter (Signed)
Patient states she is returning the call because she has called the pharmacy and there is nothing cheaper metformin. Patient states she will see if she can get help for the family to pay for medication.

## 2022-05-04 NOTE — Telephone Encounter (Signed)
She should either ask the pharmacy what is covered under her insurance plan or she can call her insurance directly using the # on the back of her card

## 2022-05-05 ENCOUNTER — Telehealth: Payer: Self-pay

## 2022-05-05 NOTE — Telephone Encounter (Signed)
Spoke w/ pt and she picked up her Janiuva . She states she is having some nausea . She states she started that after taking the medication . I advised her to take the medication with food and try some ginger ale . I told her that this should go away once the medication gets into her system . She expressed verbal understanding

## 2022-05-05 NOTE — Progress Notes (Signed)
Pt seen results Via my chart  

## 2022-05-05 NOTE — Telephone Encounter (Signed)
She won't find anything cheaper than Metformin, but is there a preferred option in the Januvia family that is less expensive (onglyza, etc)

## 2022-05-08 DIAGNOSIS — G4733 Obstructive sleep apnea (adult) (pediatric): Secondary | ICD-10-CM | POA: Diagnosis not present

## 2022-05-08 DIAGNOSIS — I10 Essential (primary) hypertension: Secondary | ICD-10-CM

## 2022-05-08 DIAGNOSIS — E1159 Type 2 diabetes mellitus with other circulatory complications: Secondary | ICD-10-CM

## 2022-05-08 DIAGNOSIS — E785 Hyperlipidemia, unspecified: Secondary | ICD-10-CM

## 2022-05-15 ENCOUNTER — Telehealth: Payer: Self-pay | Admitting: Dietician

## 2022-05-15 NOTE — Telephone Encounter (Signed)
Returned patient call. She states that she is losing weight and doing well.  She would like a cup of decaf coffee on occasion.  She does not like it with sugar but is asking what kind of coffee creamer is best. Discussed that one with more natural ingredients, avoiding hydrogenated and partially hydrogenated oils, and low in sugar and saturated fat is best.  Watch portion size.  Patient with no further questions and to call back as needed.  Follow up visit is scheduled.  Antonieta Iba, RD, LDN, CDCES

## 2022-05-18 ENCOUNTER — Other Ambulatory Visit: Payer: Self-pay

## 2022-05-18 DIAGNOSIS — E782 Mixed hyperlipidemia: Secondary | ICD-10-CM

## 2022-05-18 MED ORDER — LOSARTAN POTASSIUM 50 MG PO TABS: 50.0000 mg | ORAL_TABLET | Freq: Every day | ORAL | 6 refills | Status: DC

## 2022-05-19 ENCOUNTER — Telehealth: Payer: Self-pay | Admitting: Pharmacist

## 2022-05-19 NOTE — Progress Notes (Signed)
  Chronic Care Management Pharmacy Assistant   Name: [name]  MRN: [id number] DOB: 09/23/1956   Reason for Encounter: Disease State - Diabetes Call     Recent office visits:  05/04/22 Katherine Tabori, MD - Family Medicine - Labs were ordered. SitaGLIPtin (JANUVIA) 100 MG tablet prescribed.  STOP the Jardiance. START the Januvia once daily. Follow up as scheduled.   Recent consult visits:  None noted.   Hospital visits:  None in previous 6 months  Medications: Outpatient Encounter Medications as of 05/19/2022  Medication Sig   Alirocumab (PRALUENT) 150 MG/ML SOAJ INJECT 150MG INTO THE SKIN EVERY 14 DAYS   ALPHAGAN P 0.1 % SOLN Place 1 drop into both eyes every 8 (eight) hours.    azelastine (ASTELIN) 0.1 % nasal spray PLACE 2 SPRAYS INTO EACH NOSTRIL TWO TIMES A DAY AS DIRECTED   blood glucose meter kit and supplies KIT Dispense based on patient and insurance preference. Use up to four times daily as directed.   budesonide-formoterol (SYMBICORT) 160-4.5 MCG/ACT inhaler INHALE TWO PUFFS BY MOUTH EVERY MORNING AND INHALE TWO PUFFS BY MOUTH EVERY NIGHT AT BEDTIME   Cholecalciferol (VITAMIN D3) 50 MCG (2000 UT) CAPS Take 2,000 Units by mouth daily.   Cholecalciferol 1.25 MG (50000 UT) TABS Take 1 tablet by mouth once a week.   CLARAVIS 20 MG capsule Take 20 mg by mouth daily.   clonazePAM (KLONOPIN) 2 MG tablet 1 mg 2 (two) times daily.    Cyanocobalamin (VITAMIN B-12 PO) Take by mouth.   empagliflozin (JARDIANCE) 10 MG TABS tablet Take 1 tablet (10 mg total) by mouth daily before breakfast.   fenofibrate (TRICOR) 145 MG tablet Take 1 tablet (145 mg total) by mouth daily.   fexofenadine (ALLEGRA) 180 MG tablet Take 1 tablet (180 mg total) by mouth daily.   fluconazole (DIFLUCAN) 150 MG tablet Take 1 tab now and take the 2nd tab in 3 days   fluticasone (FLONASE) 50 MCG/ACT nasal spray SPRAY TWO SPRAYS IN EACH NOSTRIL ONCE DAILY   gabapentin (NEURONTIN) 600 MG tablet Take 1,200  mg by mouth 2 (two) times daily.    glucose blood test strip Use as instructed   lamoTRIgine (LAMICTAL) 200 MG tablet Take 200 mg by mouth 2 (two) times daily.   Lancets (ONETOUCH DELICA PLUS LANCET30G) MISC Use twice daily to test blood sugars as directed Dx- uncontrolled DM II   losartan (COZAAR) 50 MG tablet Take 1 tablet (50 mg total) by mouth daily.   LUMIGAN 0.01 % SOLN Place 1 drop into both eyes at bedtime.    meloxicam (MOBIC) 15 MG tablet Take 1 tablet (15 mg total) by mouth daily.   metoprolol succinate (TOPROL-XL) 50 MG 24 hr tablet TAKE ONE TABLET BY MOUTH DAILY WITH A MEAL   miconazole (MONISTAT 7) 2 % vaginal cream Apply to red, burning areas twice daily as needed   montelukast (SINGULAIR) 10 MG tablet Take 1 tablet (10 mg total) by mouth at bedtime.   Multiple Vitamin (MULTIVITAMIN WITH MINERALS) TABS tablet Take 1 tablet by mouth daily.   OLANZapine (ZYPREXA) 5 MG tablet Take 5 mg by mouth daily as needed.   omega-3 acid ethyl esters (LOVAZA) 1 g capsule TAKE TWO CAPSULES BY MOUTH TWICE A DAY   OVER THE COUNTER MEDICATION Senna tablets- 8.6 mg daily   Pyridoxine HCl (VITAMIN B-6 PO) Take 1 tablet by mouth daily.   QUEtiapine (SEROQUEL XR) 300 MG 24 hr tablet Take 2 tablets (600   mg total) by mouth at bedtime. (Patient taking differently: Take 600 mg by mouth at bedtime. Taking 1 400 mg tablet at bedtime, takes 50 mg daily as needed)   QUEtiapine (SEROQUEL) 50 MG tablet Take 50 mg by mouth daily.   sitaGLIPtin (JANUVIA) 100 MG tablet Take 1 tablet (100 mg total) by mouth daily.   timolol (TIMOPTIC) 0.5 % ophthalmic solution SMARTSIG:In Eye(s)   UNABLE TO FIND CPAP   venlafaxine (EFFEXOR-XR) 75 MG 24 hr capsule Take 75 mg by mouth. Take 2 tabs in the am and 1 tab in afternoonn   No facility-administered encounter medications on file as of 05/19/2022.    Current antihyperglycemic regimen:  Januvia 100 mg 1 tablet daily    What recent interventions/DTPs have been made to  improve glycemic control:  Patient was taken off Jardiance and started on Januvia.  Have there been any recent hospitalizations or ED visits since last visit with CPP?  Patient has not had any hospitalizations or ED visits since last visit with CPP.  Patient denies hypoglycemic symptoms, including Pale, Sweaty, Shaky, Hungry, Nervous/irritable, and Vision changes   Patient denies hyperglycemic symptoms, including blurry vision, excessive thirst, fatigue, polyuria, and weakness   How often are you checking your blood sugar? Patient reported checking blood sugars daily    What are your blood sugars ranging?  Fasting: 110 Before meals:  After meals:  Bedtime:   During the week, how often does your blood glucose drop below 70?  Patient reported no readings lower than 70  Are you checking your feet daily/regularly? Patient reported she has no concerns currently and checks her feet regularly.    Adherence Review: Is the patient currently on a STATIN medication? No Is the patient currently on ACE/ARB medication? No Does the patient have >5 day gap between last estimated fill dates? No   Care Gaps  AWV: overdue Colonoscopy: done 07/16/21 DM Eye Exam: due 09/03/22 DM Foot Exam: due 05/05/23 Microalbumin: done 03/07/20 HbgAIC: 04/14/22 (8.2) DEXA: done 07/11/15 Mammogram:  done 04/28/21   Star Rating Drugs: SitaGLIPtin (JANUVIA) 100 MG tablet - last filled 05/04/22 30 days    Future Appointments  Date Time Provider [place]  05/27/2022  3:00 PM [facility]  06/08/2022 10:45 AM Clydell Hakim, RD [place] NDM  09/09/2022  2:30 PM Ward Givens, NP [facility] None     Jobe Gibbon, [facility] Clinical Pharmacist Assistant  [phone]

## 2022-05-25 ENCOUNTER — Encounter: Payer: Self-pay | Admitting: Family Medicine

## 2022-05-25 ENCOUNTER — Ambulatory Visit (INDEPENDENT_AMBULATORY_CARE_PROVIDER_SITE_OTHER): Payer: Medicare HMO | Admitting: Family Medicine

## 2022-05-25 VITALS — BP 110/80 | HR 68 | Temp 98.0°F | Resp 16 | Ht 69.0 in | Wt 204.4 lb

## 2022-05-25 DIAGNOSIS — E119 Type 2 diabetes mellitus without complications: Secondary | ICD-10-CM

## 2022-05-25 MED ORDER — EMPAGLIFLOZIN 10 MG PO TABS: 10.0000 mg | ORAL_TABLET | Freq: Every day | ORAL | 3 refills | Status: DC

## 2022-05-25 NOTE — Patient Instructions (Signed)
Follow up in 1 month to recheck sugars and medication STOP the Januvia RESTART the Jardiance '10mg'$  daily KEEP UP the good work on low carb diet and regular exercise Call with any questions or concerns Hang in there!!!

## 2022-05-25 NOTE — Progress Notes (Unsigned)
   Subjective:    Patient ID: [name], female    DOB: 02-16-1956, 66 y.o.   MRN: [id number]  HPI DM- pt was started on Januvia 3 weeks ago.  Pt reports having daily nausea since starting medication.  + fatigue.  'i don't know what to do'.  Pt reports she is unable to walk the 30 minutes/day as recommended by nutrition.  Pt reports she felt ok on the Jardiance but #s were not well controlled.     Review of Systems For ROS see HPI     Objective:   Physical Exam        Assessment & Plan:

## 2022-05-26 NOTE — Assessment & Plan Note (Signed)
Ongoing issue for pt.  She reports sugars are better controlled on the Januvia but she is having daily nausea and fatigue.  She says she felt much better on the Jardiance but at that time, sugars were not controlled.  Now that sugars are back in normal range, will switch back to the Jardiance as this was more tolerable and keep a close watch on CBGs.  Pt expressed understanding and is in agreement w/ plan.

## 2022-05-27 ENCOUNTER — Other Ambulatory Visit: Payer: Self-pay

## 2022-05-27 ENCOUNTER — Telehealth: Payer: Medicare HMO

## 2022-05-27 ENCOUNTER — Telehealth: Payer: Self-pay

## 2022-05-27 DIAGNOSIS — F3132 Bipolar disorder, current episode depressed, moderate: Secondary | ICD-10-CM | POA: Diagnosis not present

## 2022-05-27 DIAGNOSIS — R69 Illness, unspecified: Secondary | ICD-10-CM | POA: Diagnosis not present

## 2022-05-27 DIAGNOSIS — E782 Mixed hyperlipidemia: Secondary | ICD-10-CM

## 2022-05-27 MED ORDER — LOSARTAN POTASSIUM 50 MG PO TABS: 50.0000 mg | ORAL_TABLET | Freq: Every day | ORAL | 6 refills | Status: DC

## 2022-05-27 NOTE — Telephone Encounter (Signed)
  Care Management   Follow Up Note   05/27/2022 Name: CERINITY ZYNDA MRN: [id number] DOB: 1956/01/08   Referred by: Midge Minium, MD Reason for referral : Chronic Care Management (RNCM: Follow up Outreach Chronic Care Management and coordination needs-unsuccessful outreach)   An unsuccessful telephone outreach was attempted today. The patient was referred to the case management team for assistance with care management and care coordination.   Follow Up Plan: The care management team will reach out to the patient again over the next 30 days.   Peter Garter RN, Jackquline Denmark, CDE Care Management Coordinator [facility] [phone]

## 2022-06-01 ENCOUNTER — Other Ambulatory Visit: Payer: Self-pay

## 2022-06-01 DIAGNOSIS — I10 Essential (primary) hypertension: Secondary | ICD-10-CM

## 2022-06-01 MED ORDER — METOPROLOL SUCCINATE ER 50 MG PO TB24: ORAL_TABLET | ORAL | 2 refills | Status: DC

## 2022-06-04 ENCOUNTER — Telehealth: Payer: Self-pay | Admitting: *Deleted

## 2022-06-04 NOTE — Chronic Care Management (AMB) (Signed)
  Chronic Care Management Note  06/04/2022 Name: SARRINAH GARDIN MRN: [id number] DOB: 1956/01/04  [name] is a 66 y.o. year old female who is a primary care patient of Birdie Riddle, Aundra Millet, MD and is actively engaged with the care management team. I reached out to [employer] by phone today to assist with re-scheduling a follow up visit with the RN Case Manager  Follow up plan: A second unsuccessful telephone outreach attempt made. A HIPAA compliant phone message was left for the patient providing contact information and requesting a return call. The care management team will reach out to the patient again over the next 7 days. If patient returns call to provider office, please advise to call [place]. at [phone].  [place]  Direct Dial: [phone]

## 2022-06-07 DIAGNOSIS — G4733 Obstructive sleep apnea (adult) (pediatric): Secondary | ICD-10-CM | POA: Diagnosis not present

## 2022-06-08 ENCOUNTER — Encounter: Payer: Medicare HMO | Attending: Family Medicine | Admitting: Dietician

## 2022-06-08 ENCOUNTER — Encounter: Payer: Self-pay | Admitting: Dietician

## 2022-06-08 ENCOUNTER — Telehealth: Payer: Self-pay | Admitting: Family Medicine

## 2022-06-08 DIAGNOSIS — Z713 Dietary counseling and surveillance: Secondary | ICD-10-CM | POA: Diagnosis not present

## 2022-06-08 DIAGNOSIS — E119 Type 2 diabetes mellitus without complications: Secondary | ICD-10-CM | POA: Diagnosis not present

## 2022-06-08 NOTE — Telephone Encounter (Signed)
error 

## 2022-06-08 NOTE — Patient Instructions (Addendum)
Walk as you are able and stretch daily.  Listen to your body.  Great job on listening to your body! Continue with the changes that you have made! "Lifestyle change" Do something everyday that brings your joy.  Creamer option:  Silk creamer  Oat milk  If you have fruit as a snack before bed, have a handful of nuts with the fruit.

## 2022-06-08 NOTE — Progress Notes (Signed)
Patient is here today alone.  She has last been seen by this RD 04/17/2022. She states that she is strict and eating right.  She is avoiding sugar.   Diabetes Self-Management Education  Visit Type: Follow-up  Appt. Start Time: 1045 Appt. End Time: 1115  06/08/2022  [name], identified by name and date of birth, is a 66 y.o. female with a diagnosis of Diabetes:  .   ASSESSMENT She is avoiding stress eating. She is losing weight and is encouraged that she can wear clothes that she has not fit in for a long time.  Fasting blood glucose 96-114 and up 146 at HS per glucose record that she brought today.  History includes:  Type 2 Diabetes, HTN, HLD, GERD, OSA on c-pap, constipation, GERD, bipolar. A1C 8.2% 04/14/2022 decreased from 8.7% 03/13/2022, eGFR 74 (05/04/2022), GFR 92, cholesterol 148, HDL 66, LDL 44, Triglycerides 189, vitamin D 20 on 04/09/21 Medications includes vitamin D, Jardiance   Weight hx: 201 lbs 06/08/2022 218 lbs 04/14/2022 219 lbs 06/23/2021 220 lbs 04/21/2021  222 lbs 04/07/2021 231 lbs January 2019 193 lbs 06/2018 207 lbs 11/22/2018   Patient lives with her dog.  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  She worked for [employer] prior to being downsized. She struggles with finances at times. She struggles with standing for long periods of time.   She has gone to a therapist but has not gone for a while.  She states that it helps when she talks with her cousin.  Being alone a lot causes her stress.  She states that she needs more structure. She enjoys playing guitar and singing.  She writes her own songs.  Last menstrual period 11/09/2000. There is no height or weight on file to calculate BMI.   Diabetes Self-Management Education - 06/08/22 1400       Visit Information   Visit Type Follow-up      Pre-Education Assessment   Patient understands the diabetes disease and treatment process. Comprehends key points    Patient understands incorporating  nutritional management into lifestyle. Comprehends key points    Patient undertands incorporating physical activity into lifestyle. Comprehends key points    Patient understands using medications safely. Comprehends key points    Patient understands monitoring blood glucose, interpreting and using results Comprehends key points    Patient understands prevention, detection, and treatment of acute complications. Comprehends key points    Patient understands prevention, detection, and treatment of chronic complications. Compreheands key points    Patient understands how to develop strategies to address psychosocial issues. Needs Review    Patient understands how to develop strategies to promote health/change behavior. Needs Review      Complications   Last HgB A1C per patient/outside source 8.2 %   04/14/2022   How often do you check your blood sugar? 1-2 times/day    Fasting Blood glucose range (mg/dL) 70-129    Postprandial Blood glucose range (mg/dL) 130-179    Number of hypoglycemic episodes per month 0    Number of hyperglycemic episodes ( >256m/dL): Rare      Dietary Intake   Breakfast oatmeal, 3/4 cup blueberres    Lunch t[place] lettuce, mustard and small amount mayo on Fit for life sprouted bread    Snack (afternoon) yogurt and nuts OR isopure protein shake with fruit    Dinner frozen meal with salmon or chicken, vegetables, starch OR plant based option, starch adn veges    Snack (evening)  nuts, occasional fruit    Beverage(s) water      Activity / Exercise   Activity / Exercise Type ADL's      Patient Education   Previous Diabetes Education Yes (please comment)   04/2022   Healthy Eating Other (comment)   encouraged continued lifestyle choices, creamer options   Being Active Role of exercise on diabetes management, blood pressure control and cardiac health.;Helped patient identify appropriate exercises in relation to his/her diabetes, diabetes complications and other health issue.     Medications Reviewed patients medication for diabetes, action, purpose, timing of dose and side effects.    Monitoring Taught/evaluated SMBG meter.    Diabetes Stress and Support Identified and addressed patients feelings and concerns about diabetes      Individualized Goals (developed by patient)   Nutrition General guidelines for healthy choices and portions discussed    Physical Activity 15 minutes per day    Medications take my medication as prescribed    Monitoring  Test my blood glucose as discussed    Problem Solving Other (comment)   consistency   Reducing Risk examine blood glucose patterns      Patient Self-Evaluation of Goals - Patient rates self as meeting previously set goals (% of time)   Nutrition >75% (most of the time)    Physical Activity >75% (most of the time)    Medications >75% (most of the time)    Monitoring >75% (most of the time)    Problem Solving and behavior change strategies  >75% (most of the time)    Reducing Risk (treating acute and chronic complications) >99% (most of the time)    Health Coping >75% (most of the time)      Post-Education Assessment   Patient understands the diabetes disease and treatment process. Demonstrates understanding / competency    Patient understands incorporating nutritional management into lifestyle. Demonstrates understanding / competency    Patient undertands incorporating physical activity into lifestyle. Demonstrates understanding / competency    Patient understands using medications safely. Demonstrates understanding / competency    Patient understands monitoring blood glucose, interpreting and using results Demonstrates understanding / competency    Patient understands prevention, detection, and treatment of acute complications. Demonstrates understanding / competency    Patient understands prevention, detection, and treatment of chronic complications. Demonstrates understanding / competency    Patient understands how  to develop strategies to address psychosocial issues. Comprehends key points    Patient understands how to develop strategies to promote health/change behavior. Comprehends key points      Outcomes   Expected Outcomes Demonstrated interest in learning. Expect positive outcomes    Future DMSE 2 months    Program Status Not Completed      Subsequent Visit   Since your last visit have you experienced any weight changes? Loss    Weight Loss (lbs) 17             Individualized Plan for Diabetes Self-Management Training:   Learning Objective:  Patient will have a greater understanding of diabetes self-management. Patient education plan is to attend individual and/or group sessions per assessed needs and concerns.   Plan:   Patient Instructions  Walk as you are able and stretch daily.  Listen to your body.  Great job on listening to your body! Continue with the changes that you have made! "Lifestyle change" Do something everyday that brings your joy.  Creamer option:  Silk creamer  Oat milk  If you have fruit as  a snack before bed, have a handful of nuts with the fruit. Expected Outcomes:  Demonstrated interest in learning. Expect positive outcomes  Education material provided:   If problems or questions, patient to contact team via:  Phone  Future DSME appointment: 2 months

## 2022-06-09 DIAGNOSIS — F3132 Bipolar disorder, current episode depressed, moderate: Secondary | ICD-10-CM | POA: Diagnosis not present

## 2022-06-09 DIAGNOSIS — R69 Illness, unspecified: Secondary | ICD-10-CM | POA: Diagnosis not present

## 2022-06-09 NOTE — Chronic Care Management (AMB) (Signed)
  Chronic Care Management Note  06/09/2022 Name: LIDIYA REISE MRN: [id number] DOB: [birth year]  Leylany J Koenigsberg is a 66 y.o. year old female who is a primary care patient of Birdie Riddle, Aundra Millet, MD and is actively engaged with the care management team. I reached out to [employer] by phone today to assist with re-scheduling a follow up visit with the RN Case Manager  Follow up plan: Unsuccessful telephone outreach attempt made. A HIPAA compliant phone message was left for the patient providing contact information and requesting a return call.  Unable to make contact on outreach attempts x 2. PCP Midge Minium, MD notified via routed documentation in medical record.  We have been unable to make contact with the patient for follow up. The care management team is available to follow up with the patient after provider conversation with the patient regarding recommendation for care management engagement and subsequent re-referral to the care management team.   [place]  Direct Dial: [phone]

## 2022-06-10 ENCOUNTER — Other Ambulatory Visit: Payer: Self-pay

## 2022-06-10 ENCOUNTER — Telehealth: Payer: Self-pay | Admitting: *Deleted

## 2022-06-10 DIAGNOSIS — I10 Essential (primary) hypertension: Secondary | ICD-10-CM

## 2022-06-10 MED ORDER — METOPROLOL SUCCINATE ER 50 MG PO TB24: ORAL_TABLET | ORAL | 2 refills | Status: DC

## 2022-06-10 NOTE — Chronic Care Management (AMB) (Signed)
  Chronic Care Management Note  06/10/2022 Name: [name] MRN: [id number] DOB: 24-Oct-1956  [name] is a 66 y.o. year old female who is a primary care patient of Birdie Riddle, Aundra Millet, MD and is actively engaged with the care management team.  [name] reached out by phone today to assist with re-scheduling a follow up visit with the RN Case Manager  Follow up plan: Telephone appointment with care management team member scheduled for:06/17/22  [name]  Care Coordination Care Guide  Direct Dial: [phone]

## 2022-06-11 ENCOUNTER — Ambulatory Visit: Payer: Medicare HMO | Admitting: Dietician

## 2022-06-17 ENCOUNTER — Ambulatory Visit: Payer: Self-pay

## 2022-06-17 ENCOUNTER — Telehealth: Payer: Medicare HMO

## 2022-06-17 DIAGNOSIS — E119 Type 2 diabetes mellitus without complications: Secondary | ICD-10-CM

## 2022-06-17 DIAGNOSIS — M545 Low back pain, unspecified: Secondary | ICD-10-CM | POA: Diagnosis not present

## 2022-06-17 DIAGNOSIS — I10 Essential (primary) hypertension: Secondary | ICD-10-CM

## 2022-06-17 NOTE — Patient Instructions (Signed)
Visit Information Case closed  Thank you for allowing me to share the care management and care coordination services that are available to you as part of your health plan and services through your primary care provider and medical home. Please reach out to me at [phone] if the care management/care coordination team may be of assistance to you in the future.  Peter Garter RN, Jackquline Denmark, CDE Care Management Coordinator [facility] [phone]

## 2022-06-17 NOTE — Chronic Care Management (AMB) (Signed)
Care Management    RN Visit Note  06/17/2022 Name: [name] MRN: [id number] DOB: 02-Mar-1956  Subjective: [name] is a 66 y.o. year old female who is a primary care patient of Tabori, Melanie Millet, MD. The care management team was consulted for assistance with disease management and care coordination needs.    4th unsuccessful telephone outreach was attempted today  for  Case closed unable to contact maintain   in response to provider referral for case management and/or care coordination services.   Consent to Services:   [name] was given information about Care Management services today including:  Care Management services includes personalized support from designated clinical staff supervised by her physician, including individualized plan of care and coordination with other care providers 24/7 contact phone numbers for assistance for urgent and routine care needs. The patient may stop case management services at any time by phone call to the office staff.  Patient agreed to services and consent obtained.   Assessment: Review of patient past medical history, allergies, medications, health status, including review of consultants reports, laboratory and other test data, was performed as part of comprehensive evaluation and provision of chronic care management services.   SDOH (Social Determinants of Health) assessments and interventions performed:    Care Plan  Allergies  Allergen Reactions   Nitrofurantoin Monohyd Macro Nausea Only    Severe headache   Statins Other (See Comments)    Severe weakness, pain   Alprazolam Anxiety and Other (See Comments)    Hyper    Amoxicillin Rash   Prednisone Anxiety   Sulfa Antibiotics Nausea And Vomiting    Outpatient Encounter Medications as of 06/17/2022  Medication Sig   Alirocumab (PRALUENT) 150 MG/ML SOAJ INJECT 150MG INTO THE SKIN EVERY 14 DAYS   ALPHAGAN P 0.1 % SOLN Place 1 drop into both eyes every 8 (eight) hours.     azelastine (ASTELIN) 0.1 % nasal spray PLACE 2 SPRAYS INTO EACH NOSTRIL TWO TIMES A DAY AS DIRECTED   blood glucose meter kit and supplies KIT Dispense based on patient and insurance preference. Use up to four times daily as directed.   budesonide-formoterol (SYMBICORT) 160-4.5 MCG/ACT inhaler INHALE TWO PUFFS BY MOUTH EVERY MORNING AND INHALE TWO PUFFS BY MOUTH EVERY NIGHT AT BEDTIME   Cholecalciferol (VITAMIN D3) 50 MCG (2000 UT) CAPS Take 2,000 Units by mouth daily.   Cholecalciferol 1.25 MG (50000 UT) TABS Take 1 tablet by mouth once a week.   CLARAVIS 20 MG capsule Take 20 mg by mouth daily.   clonazePAM (KLONOPIN) 2 MG tablet 1 mg 2 (two) times daily.    Cyanocobalamin (VITAMIN B-12 PO) Take by mouth.   empagliflozin (JARDIANCE) 10 MG TABS tablet Take 1 tablet (10 mg total) by mouth daily before breakfast.   fenofibrate (TRICOR) 145 MG tablet Take 1 tablet (145 mg total) by mouth daily.   fexofenadine (ALLEGRA) 180 MG tablet Take 1 tablet (180 mg total) by mouth daily.   fluconazole (DIFLUCAN) 150 MG tablet Take 1 tab now and take the 2nd tab in 3 days   fluticasone (FLONASE) 50 MCG/ACT nasal spray SPRAY TWO SPRAYS IN EACH NOSTRIL ONCE DAILY   gabapentin (NEURONTIN) 600 MG tablet Take 1,200 mg by mouth 2 (two) times daily.    glucose blood test strip Use as instructed   lamoTRIgine (LAMICTAL) 200 MG tablet Take 200 mg by mouth 2 (two) times daily.   Lancets (ONETOUCH DELICA PLUS NATFTD32K) MISC Use twice  daily to test blood sugars as directed Dx- uncontrolled DM II   losartan (COZAAR) 50 MG tablet Take 1 tablet (50 mg total) by mouth daily.   LUMIGAN 0.01 % SOLN Place 1 drop into both eyes at bedtime.    meloxicam (MOBIC) 15 MG tablet Take 1 tablet (15 mg total) by mouth daily.   metoprolol succinate (TOPROL-XL) 50 MG 24 hr tablet TAKE ONE TABLET BY MOUTH DAILY WITH A MEAL   miconazole (MONISTAT 7) 2 % vaginal cream Apply to red, burning areas twice daily as needed   montelukast  (SINGULAIR) 10 MG tablet Take 1 tablet (10 mg total) by mouth at bedtime.   Multiple Vitamin (MULTIVITAMIN WITH MINERALS) TABS tablet Take 1 tablet by mouth daily.   OLANZapine (ZYPREXA) 5 MG tablet Take 5 mg by mouth daily as needed.   omega-3 acid ethyl esters (LOVAZA) 1 g capsule TAKE TWO CAPSULES BY MOUTH TWICE A DAY   OVER THE COUNTER MEDICATION Senna tablets- 8.6 mg daily   Pyridoxine HCl (VITAMIN B-6 PO) Take 1 tablet by mouth daily.   QUEtiapine (SEROQUEL XR) 300 MG 24 hr tablet Take 2 tablets (600 mg total) by mouth at bedtime. (Patient taking differently: Take 600 mg by mouth at bedtime. Taking 1 400 mg tablet at bedtime, takes 50 mg daily as needed)   QUEtiapine (SEROQUEL) 50 MG tablet Take 50 mg by mouth daily.   timolol (TIMOPTIC) 0.5 % ophthalmic solution SMARTSIG:In Eye(s)   UNABLE TO FIND CPAP   venlafaxine (EFFEXOR-XR) 75 MG 24 hr capsule Take 75 mg by mouth. Take 2 tabs in the am and 1 tab in afternoonn   No facility-administered encounter medications on file as of 06/17/2022.    Patient Active Problem List   Diagnosis Date Noted   Hx of adenomatous colonic polyps 07/2021   Sleep related hypoxia 12/17/2020   Cervical myelopathy ([place]) 10/16/2020   Elevated blood-pressure reading, without diagnosis of hypertension 10/16/2020   Lumbar radiculopathy 10/16/2020   Positive ANA (antinuclear antibody) 08/14/2020   Chronically dry eyes, bilateral 08/14/2020   Dry mouth 08/14/2020   Allergic rhinitis due to pollen 12/13/2018   Diabetes mellitus without complication ([place]) 11/57/2620   Statin intolerance 11/04/2017   Family history of early CAD 03/10/2017   HTN (hypertension) 02/15/2017   Herniated nucleus pulposus, L5-S1 06/22/2014   Asthma, moderate persistent 10/09/2013   Obesity (BMI 30-39.9) 10/09/2013   Fatigue 12/12/2012   GERD (gastroesophageal reflux disease) 09/02/2012   Physical exam 07/03/2011   Vitamin D deficiency 09/15/2010   DEPRESSIVE DISORDER 07/17/2010    RHINITIS 06/14/2009   MUSCLE WEAKNESS (GENERALIZED) 01/03/2009   Hyperlipidemia 10/10/2008   BIPOLAR AFFECTIVE DISORDER 10/10/2008   ADD 10/10/2008    Conditions to be addressed/monitored: HTN and DMII  Care Plan : RN Care Manager Plan of Care  Updates made by Dimitri Ped, RN since 06/17/2022 12:00 AM  Completed 06/17/2022   Problem: Chronic Disease Management and Care Coordination Needs (DM,HTN ,HLD, Vitamin D deficiency) Resolved 06/17/2022  Priority: High     Long-Range Goal: Establish Plan of Care for Chronic Disease Management Needs (DM,HTN ,HLD, Vitamin D deficiency) Completed 06/17/2022  Start Date: 02/18/2022  Expected End Date: 02/18/2023  Priority: High  Note:   Case closed unable to contact maintain  Current Barriers:  Knowledge Deficits related to plan of care for management of HTN, HLD, DMII, Bipolar Disorder, and Vitamin D Deficiency    Care Coordination needs related to Limited social support and Mental Health  Concerns  Chronic Disease Management support and education needs related to HTN, HLD, DMII, Bipolar Disorder, and  Vitamin D Deficiency   States that meeting with the RD has been helpful.  States she is now starting to check her CBGs but she is still learning how to do them. States that the RD showed her how to use the glucometer but she is not sure she is doing it right.  States her reading was 155 this morning. States she has been trying to cut out sweets but it is struggle at times.  States she does walk her dog 30 minutes a day and she wants to start going to the gym.  States she is having issues with stress and coping with her illness and will see her therapist.   Denies and chest pains, shortness of breath or swelling.  States she does not been checking her B/P at home.  RNCM Clinical Goal(s):  Patient will verbalize understanding of plan for management of HTN, HLD, DMII, Bipolar Disorder, and  Vitamin D Deficiency  as evidenced by voiced adherence to plan of  care verbalize basic understanding of  HTN, HLD, DMII, Bipolar Disorder, and  Vitamin D Deficiency  disease process and self health management plan as evidenced by voiced understanding and teach back  take all medications exactly as prescribed and will call provider for medication related questions as evidenced by dispense report and pt verbalization  attend all scheduled medical appointments: RD 06/08/22, Neurology 09/09/22 as evidenced by medical records demonstrate Improved adherence to prescribed treatment plan for HTN, HLD, DMII, Bipolar Disorder, and  Vitamin D Deficiency  as evidenced by readings within limits, voiced adherence to plan of care continue to work with RN Care Manager to address care management and care coordination needs related to  HTN, HLD, DMII, Bipolar Disorder, and  Vitamin D Deficiency  as evidenced by adherence to CM Team Scheduled appointments work with Education officer, museum to address  related to the management of Limited social support, Mental Health Concerns , and stress of illnesses related to the management of HTN, HLD, DMII, Bipolar Disorder, and  Vitamin D Deficiency  as evidenced by review of EMR and patient or social worker report through collaboration with Consulting civil engineer, provider, and care team.   Interventions: 1:1 collaboration with primary care provider regarding development and update of comprehensive plan of care as evidenced by provider attestation and co-signature Inter-disciplinary care team collaboration (see longitudinal plan of care) Evaluation of current treatment plan related to  self management and patient's adherence to plan as established by provider [place]  calendar and  Booklet: How to Thrive: A Guide for Your Journey with diabetes    Diabetes Interventions:  (Status:  Gaol Not Met.) Long Term Goal Assessed patient's understanding of A1c goal: <7% Provided education to patient about basic DM disease process Reviewed  medications with patient and discussed importance of medication adherence Counseled on importance of regular laboratory monitoring as prescribed Discussed plans with patient for ongoing care management follow up and provided patient with direct contact information for care management team Provided patient with written educational materials related to hypo and hyperglycemia and importance of correct treatment Advised patient, providing education and rationale, to check cbg 1-2 times a day and record, calling provider for findings outside established parameters Review of patient status, including review of consultants reports, relevant laboratory and other test results, and medications completed Reviewed use of glucometer and how to get a good fingerstick specimen.  Reviewed fasting blood sugar goals of 80-130 and less than 180 1 1/2-2 hours after meals. Reviewed is read labels for CHO count and reviewed free phone apps to help count CHO   Lab Results  Component Value Date   HGBA1C 8.2 (A) 04/14/2022    Vitamin D Deficiency   (Status:  Goal Met.)  Long Term Goal Evaluation of current treatment plan related to   Vitamin D Deficiency  , Limited social support and Mental Health Concerns  self-management and patient's adherence to plan as established by provider. Discussed plans with patient for ongoing care management follow up and provided patient with direct contact information for care management team Evaluation of current treatment plan related to  Vitamin D Deficiency  and patient's adherence to plan as established by provider Advised patient to eat foods with higher vitamin D and get sunlight Provided education to patient re:  Vitamin D Deficiency  Reviewed medications with patient and discussed prescribed higher dose of vitamin D Discussed plans with patient for ongoing care management follow up and provided patient with direct contact information for care management team Reviewed to get sunlight  exposure regularly  Hyperlipidemia Interventions:  (Status:  Goal Met.) Long Term Goal Medication review performed; medication list updated in electronic medical record.  Provider established cholesterol goals reviewed Counseled on importance of regular laboratory monitoring as prescribed Reviewed importance of limiting foods high in cholesterol Reviewed exercise goals and target of 150 minutes per week Reviewed to continue walking and to try to get to gym  Hypertension Interventions:  (Status:  Goal Met.) Long Term Goal Last practice recorded BP readings:  BP Readings from Last 3 Encounters:  04/14/22 136/88  04/08/22 126/78  03/25/22 112/82  Most recent eGFR/CrCl:  Lab Results  Component Value Date   EGFR 93 09/08/2021    No components found for: CRCL  Evaluation of current treatment plan related to hypertension self management and patient's adherence to plan as established by provider Provided education to patient re: stroke prevention, s/s of heart attack and stroke Reviewed medications with patient and discussed importance of compliance Counseled on the importance of exercise goals with target of 150 minutes per week Discussed plans with patient for ongoing care management follow up and provided patient with direct contact information for care management team Advised patient, providing education and rationale, to monitor blood pressure daily and record, calling PCP for findings outside established parameters Provided education on prescribed diet low sodium low CHO  Patient Goals/Self-Care Activities: Take all medications as prescribed Attend all scheduled provider appointments Call pharmacy for medication refills 3-7 days in advance of running out of medications Perform all self care activities independently  Call provider office for new concerns or questions  keep appointment with eye doctor check blood sugar at prescribed times: twice daily and when you have symptoms of  low or high blood sugar check feet daily for cuts, sores or redness take the blood sugar log to all doctor visits drink 6 to 8 glasses of water each day eat fish at least once per week fill half of plate with vegetables limit fast food meals to no more than 1 per week manage portion size switch to sugar-free drinks check blood pressure weekly choose a place to take my blood pressure (home, clinic or office, retail store) take blood pressure log to all doctor appointments eat more whole grains, fruits and vegetables, lean meats and healthy fats limit salt intake to 2323m/day call for medicine refill 2 or  3 days before it runs out take all medications exactly as prescribed call doctor with any symptoms you believe are related to your medicine call doctor when you experience any new symptoms  Follow Up Plan:  The patient has been provided with contact information for the care management team and has been advised to call with any health related questions or concerns.  No further follow up required: Case closed unable to contact maintain        Plan: The patient has been provided with contact information for the care management team and has been advised to call with any health related questions or concerns.  No further follow up required: Case closed unable to contact maintain   [place], [facility], CDE Care Management Coordinator [facility] [phone]

## 2022-06-25 ENCOUNTER — Telehealth: Payer: Self-pay

## 2022-06-25 ENCOUNTER — Encounter: Payer: Self-pay | Admitting: Family Medicine

## 2022-06-25 ENCOUNTER — Other Ambulatory Visit: Payer: Self-pay

## 2022-06-25 ENCOUNTER — Ambulatory Visit (INDEPENDENT_AMBULATORY_CARE_PROVIDER_SITE_OTHER): Payer: Medicare HMO | Admitting: Family Medicine

## 2022-06-25 VITALS — BP 116/68 | HR 68 | Temp 97.4°F | Resp 17 | Ht 69.0 in | Wt 204.1 lb

## 2022-06-25 DIAGNOSIS — E119 Type 2 diabetes mellitus without complications: Secondary | ICD-10-CM

## 2022-06-25 LAB — BASIC METABOLIC PANEL
BUN: 19 mg/dL (ref 6–23)
CO2: 28 mEq/L (ref 19–32)
Calcium: 9.5 mg/dL (ref 8.4–10.5)
Chloride: 105 mEq/L (ref 96–112)
Creatinine, Ser: 0.74 mg/dL (ref 0.40–1.20)
GFR: 85.24 mL/min (ref >60.00)
Glucose, Bld: 119 mg/dL — ABNORMAL HIGH (ref 70–99)
Potassium: 4 mEq/L (ref 3.5–5.1)
Sodium: 141 mEq/L (ref 135–145)

## 2022-06-25 LAB — MICROALBUMIN / CREATININE URINE RATIO
Creatinine,U: 45.6 mg/dL
Microalb Creat Ratio: 2.9 mg/g (ref 0.0–30.0)
Microalb, Ur: 1.3 mg/dL (ref 0.0–1.9)

## 2022-06-25 LAB — HEMOGLOBIN A1C: Hgb A1c MFr Bld: 6.6 % — ABNORMAL HIGH (ref 4.6–6.5)

## 2022-06-25 MED ORDER — ONETOUCH DELICA PLUS LANCET30G MISC
1 refills | Status: DC
Start: 2022-06-25 — End: 2022-09-23

## 2022-06-25 NOTE — Assessment & Plan Note (Signed)
Ongoing issue for pt.  She is feeling much better since switching back to Jardiance and sugars have remained well controlled.  Currently asymptomatic.  Working on low carb/low sugar diet.  Applauded her efforts.  Will get A1C and microalbumin today and adjust meds prn.

## 2022-06-25 NOTE — Progress Notes (Signed)
   Subjective:    Patient ID: [name], female    DOB: April 10, 1956, 66 y.o.   MRN: [id number]  HPI DM- at last visit pt was restarted on Jardiance and we stopped the Januvia.  Last A1C 8.2% on 04/14/22.  Pt reports she has been eating better until she went to her cousin's funeral yesterday.  Home sugars have been running 110-140s.  Pt reports feeling physically better since changing meds.  No CP, SOB, HAs, visual changes, abd pain, N/V.   Review of Systems For ROS see HPI     Objective:   Physical Exam Vitals reviewed.  Constitutional:      General: She is not in acute distress.    Appearance: Normal appearance. She is well-developed. She is not ill-appearing.  HENT:     Head: Normocephalic and atraumatic.  Eyes:     Conjunctiva/sclera: Conjunctivae normal.     Pupils: Pupils are equal, round, and reactive to light.  Neck:     Thyroid: No thyromegaly.  Cardiovascular:     Rate and Rhythm: Normal rate and regular rhythm.     Heart sounds: Normal heart sounds. No murmur heard. Pulmonary:     Effort: Pulmonary effort is normal. No respiratory distress.     Breath sounds: Normal breath sounds.  Abdominal:     General: There is no distension.     Palpations: Abdomen is soft.     Tenderness: There is no abdominal tenderness.  Musculoskeletal:     Cervical back: Normal range of motion and neck supple.     Right lower leg: No edema.     Left lower leg: No edema.  Lymphadenopathy:     Cervical: No cervical adenopathy.  Skin:    General: Skin is warm and dry.  Neurological:     General: No focal deficit present.     Mental Status: She is alert and oriented to person, place, and time.  Psychiatric:        Mood and Affect: Mood normal.        Behavior: Behavior normal.           Assessment & Plan:

## 2022-06-25 NOTE — Patient Instructions (Signed)
Follow up in 3-4 months to recheck sugars We'll notify you of your lab results and make any changes if needed Keep up the good work on healthy diet and regular exercise- you're doing great!!! Call with any questions or concerns Stay Safe!  Stay Healthy! Enjoy the rest of your summer!!!

## 2022-06-25 NOTE — Telephone Encounter (Signed)
Pt only needs to be checking sugar twice daily

## 2022-06-25 NOTE — Telephone Encounter (Signed)
Informed pharmacist at [facility] that pt only checks her Blood sugars twice a day

## 2022-06-26 NOTE — Progress Notes (Signed)
Informed pt of lab results  

## 2022-06-30 ENCOUNTER — Ambulatory Visit (INDEPENDENT_AMBULATORY_CARE_PROVIDER_SITE_OTHER): Payer: Medicare HMO

## 2022-06-30 DIAGNOSIS — Z0000 Encounter for general adult medical examination without abnormal findings: Secondary | ICD-10-CM | POA: Diagnosis not present

## 2022-06-30 NOTE — Patient Instructions (Signed)
[name] , Thank you for taking time to come for your Medicare Wellness Visit. I appreciate your ongoing commitment to your health goals. Please review the following plan we discussed and let me know if I can assist you in the future.   Screening recommendations/referrals: Colonoscopy: 07/16/2021  due 07/2024 Mammogram:  Bone Density: Not of Age  Recommended yearly ophthalmology/optometry visit for glaucoma screening and checkup Recommended yearly dental visit for hygiene and checkup  Vaccinations: Influenza vaccine: completed  Pneumococcal vaccine: Completed  Tdap vaccine: 07/15/2012 Shingles vaccine: completed     Advanced directives: none   Conditions/risks identified: none   Next appointment: none   Preventive Care 40-64 Years, Female Preventive care refers to lifestyle choices and visits with your health care provider that can promote health and wellness. What does preventive care include? A yearly physical exam. This is also called an annual well check. Dental exams once or twice a year. Routine eye exams. Ask your health care provider how often you should have your eyes checked. Personal lifestyle choices, including: Daily care of your teeth and gums. Regular physical activity. Eating a healthy diet. Avoiding tobacco and drug use. Limiting alcohol use. Practicing safe sex. Taking low-dose aspirin daily starting at age 76. Taking vitamin and mineral supplements as recommended by your health care provider. What happens during an annual well check? The services and screenings done by your health care provider during your annual well check will depend on your age, overall health, lifestyle risk factors, and family history of disease. Counseling  Your health care provider may ask you questions about your: Alcohol use. Tobacco use. Drug use. Emotional well-being. Home and relationship well-being. Sexual activity. Eating habits. Work and work Statistician. Method of  birth control. Menstrual cycle. Pregnancy history. Screening  You may have the following tests or measurements: Height, weight, and BMI. Blood pressure. Lipid and cholesterol levels. These may be checked every 5 years, or more frequently if you are over 79 years old. Skin check. Lung cancer screening. You may have this screening every year starting at age 34 if you have a 30-pack-year history of smoking and currently smoke or have quit within the past 15 years. Fecal occult blood test (FOBT) of the stool. You may have this test every year starting at age 53. Flexible sigmoidoscopy or colonoscopy. You may have a sigmoidoscopy every 5 years or a colonoscopy every 10 years starting at age 64. Hepatitis C blood test. Hepatitis B blood test. Sexually transmitted disease (STD) testing. Diabetes screening. This is done by checking your blood sugar (glucose) after you have not eaten for a while (fasting). You may have this done every 1-3 years. Mammogram. This may be done every 1-2 years. Talk to your health care provider about when you should start having regular mammograms. This may depend on whether you have a family history of breast cancer. BRCA-related cancer screening. This may be done if you have a family history of breast, ovarian, tubal, or peritoneal cancers. Pelvic exam and Pap test. This may be done every 3 years starting at age 88. Starting at age 53, this may be done every 5 years if you have a Pap test in combination with an HPV test. Bone density scan. This is done to screen for osteoporosis. You may have this scan if you are at high risk for osteoporosis. Discuss your test results, treatment options, and if necessary, the need for more tests with your health care provider. Vaccines  Your health care provider may  recommend certain vaccines, such as: Influenza vaccine. This is recommended every year. Tetanus, diphtheria, and acellular pertussis (Tdap, Td) vaccine. You may need a Td  booster every 10 years. Zoster vaccine. You may need this after age 88. Pneumococcal 13-valent conjugate (PCV13) vaccine. You may need this if you have certain conditions and were not previously vaccinated. Pneumococcal polysaccharide (PPSV23) vaccine. You may need one or two doses if you smoke cigarettes or if you have certain conditions. Talk to your health care provider about which screenings and vaccines you need and how often you need them. This information is not intended to replace advice given to you by your health care provider. Make sure you discuss any questions you have with your health care provider. Document Released: 11/22/2015 Document Revised: 07/15/2016 Document Reviewed: 08/27/2015 Elsevier Interactive Patient Education  2017 [place] Prevention in the Home Falls can cause injuries. They can happen to people of all ages. There are many things you can do to make your home safe and to help prevent falls. What can I do on the outside of my home? Regularly fix the edges of walkways and driveways and fix any cracks. Remove anything that might make you trip as you walk through a door, such as a raised step or threshold. Trim any bushes or trees on the path to your home. Use bright outdoor lighting. Clear any walking paths of anything that might make someone trip, such as rocks or tools. Regularly check to see if handrails are loose or broken. Make sure that both sides of any steps have handrails. Any raised decks and porches should have guardrails on the edges. Have any leaves, snow, or ice cleared regularly. Use sand or salt on walking paths during winter. Clean up any spills in your garage right away. This includes oil or grease spills. What can I do in the bathroom? Use night lights. Install grab bars by the toilet and in the tub and shower. Do not use towel bars as grab bars. Use non-skid mats or decals in the tub or shower. If you need to sit down in the  shower, use a plastic, non-slip stool. Keep the floor dry. Clean up any water that spills on the floor as soon as it happens. Remove soap buildup in the tub or shower regularly. Attach bath mats securely with double-sided non-slip rug tape. Do not have throw rugs and other things on the floor that can make you trip. What can I do in the bedroom? Use night lights. Make sure that you have a light by your bed that is easy to reach. Do not use any sheets or blankets that are too big for your bed. They should not hang down onto the floor. Have a firm chair that has side arms. You can use this for support while you get dressed. Do not have throw rugs and other things on the floor that can make you trip. What can I do in the kitchen? Clean up any spills right away. Avoid walking on wet floors. Keep items that you use a lot in easy-to-reach places. If you need to reach something above you, use a strong step stool that has a grab bar. Keep electrical cords out of the way. Do not use floor polish or wax that makes floors slippery. If you must use wax, use non-skid floor wax. Do not have throw rugs and other things on the floor that can make you trip. What can I do with my  stairs? Do not leave any items on the stairs. Make sure that there are handrails on both sides of the stairs and use them. Fix handrails that are broken or loose. Make sure that handrails are as long as the stairways. Check any carpeting to make sure that it is firmly attached to the stairs. Fix any carpet that is loose or worn. Avoid having throw rugs at the top or bottom of the stairs. If you do have throw rugs, attach them to the floor with carpet tape. Make sure that you have a light switch at the top of the stairs and the bottom of the stairs. If you do not have them, ask someone to add them for you. What else can I do to help prevent falls? Wear shoes that: Do not have high heels. Have rubber bottoms. Are comfortable and  fit you well. Are closed at the toe. Do not wear sandals. If you use a stepladder: Make sure that it is fully opened. Do not climb a closed stepladder. Make sure that both sides of the stepladder are locked into place. Ask someone to hold it for you, if possible. Clearly mark and make sure that you can see: Any grab bars or handrails. First and last steps. Where the edge of each step is. Use tools that help you move around (mobility aids) if they are needed. These include: Canes. Walkers. Scooters. Crutches. Turn on the lights when you go into a dark area. Replace any light bulbs as soon as they burn out. Set up your furniture so you have a clear path. Avoid moving your furniture around. If any of your floors are uneven, fix them. If there are any pets around you, be aware of where they are. Review your medicines with your doctor. Some medicines can make you feel dizzy. This can increase your chance of falling. Ask your doctor what other things that you can do to help prevent falls. This information is not intended to replace advice given to you by your health care provider. Make sure you discuss any questions you have with your health care provider. Document Released: 08/22/2009 Document Revised: 04/02/2016 Document Reviewed: 11/30/2014 Elsevier Interactive Patient Education  2017 [employer].

## 2022-06-30 NOTE — Progress Notes (Cosign Needed Addendum)
Subjective:   [name] is a 66 y.o. female who presents for Medicare Annual (Subsequent) preventive examination.   I connected with [name] today by telephone and verified that I am speaking with the correct person using two identifiers. Location patient: home Location provider: [place] participating in the virtual visit: patient, provider.   I discussed the limitations, risks, security and privacy concerns of performing an evaluation and management service by telephone and the availability of in person appointments. I also discussed with the patient that there may be a patient responsible charge related to this service. The patient expressed understanding and verbally consented to this telephonic visit.    Interactive audio and video telecommunications were attempted between this provider and patient, however failed, due to patient having technical difficulties OR patient did not have access to video capability.  We continued and completed visit with audio only.    Review of Systems     Cardiac Risk Factors include: advanced age (>41mn, >>84women);diabetes mellitus     Objective:    Today's Vitals   There is no height or weight on file to calculate BMI.     06/30/2022   10:13 AM 04/14/2022    9:39 AM 02/27/2022    1:47 PM 02/22/2022    7:58 PM 02/18/2022   10:00 AM 07/27/2021    4:16 PM 04/21/2021    4:00 PM  Advanced Directives  Does Patient Have a Medical Advance Directive? Yes No Yes No;Yes Yes No Yes  Type of AParamed[place]of [place] will  [place] will [place] will [place]Living will    Does patient want to make changes to medical advance directive?   No - Patient declined No - Patient declined No - Patient declined  No - Patient declined  Copy of HRodessain Chart? No - copy requested  No - copy requested No - copy  requested No - copy requested    Would patient like information on creating a medical advance directive?  No - Patient declined         Current Medications (verified) Outpatient Encounter Medications as of 06/30/2022  Medication Sig   Alirocumab (PRALUENT) 150 MG/ML SOAJ INJECT 150MG INTO THE SKIN EVERY 14 DAYS   ALPHAGAN P 0.1 % SOLN Place 1 drop into both eyes every 8 (eight) hours.    azelastine (ASTELIN) 0.1 % nasal spray PLACE 2 SPRAYS INTO EACH NOSTRIL TWO TIMES A DAY AS DIRECTED   blood glucose meter kit and supplies KIT Dispense based on patient and insurance preference. Use up to four times daily as directed.   budesonide-formoterol (SYMBICORT) 160-4.5 MCG/ACT inhaler INHALE TWO PUFFS BY MOUTH EVERY MORNING AND INHALE TWO PUFFS BY MOUTH EVERY NIGHT AT BEDTIME   Cholecalciferol (VITAMIN D3) 50 MCG (2000 UT) CAPS Take 2,000 Units by mouth daily.   Cholecalciferol 1.25 MG (50000 UT) TABS Take 1 tablet by mouth once a week.   CLARAVIS 20 MG capsule Take 20 mg by mouth daily.   clonazePAM (KLONOPIN) 2 MG tablet 1 mg 2 (two) times daily.    Cyanocobalamin (VITAMIN B-12 PO) Take by mouth.   empagliflozin (JARDIANCE) 10 MG TABS tablet Take 1 tablet (10 mg total) by mouth daily before breakfast.   fenofibrate (TRICOR) 145 MG tablet Take 1 tablet (145 mg total) by mouth daily.   fexofenadine (ALLEGRA) 180 MG tablet Take 1 tablet (180  mg total) by mouth daily.   fluticasone (FLONASE) 50 MCG/ACT nasal spray SPRAY TWO SPRAYS IN EACH NOSTRIL ONCE DAILY   gabapentin (NEURONTIN) 600 MG tablet Take 1,200 mg by mouth 2 (two) times daily.    glucose blood test strip Use as instructed   lamoTRIgine (LAMICTAL) 200 MG tablet Take 200 mg by mouth 2 (two) times daily.   Lancets (ONETOUCH DELICA PLUS [id number]) MISC Use twice daily to test blood sugars as directed Dx- uncontrolled DM II   losartan (COZAAR) 50 MG tablet Take 1 tablet (50 mg total) by mouth daily.   LUMIGAN 0.01 % SOLN Place 1 drop into both  eyes at bedtime.    meloxicam (MOBIC) 15 MG tablet Take 1 tablet (15 mg total) by mouth daily.   metoprolol succinate (TOPROL-XL) 50 MG 24 hr tablet TAKE ONE TABLET BY MOUTH DAILY WITH A MEAL   miconazole (MONISTAT 7) 2 % vaginal cream Apply to red, burning areas twice daily as needed   montelukast (SINGULAIR) 10 MG tablet Take 1 tablet (10 mg total) by mouth at bedtime.   Multiple Vitamin (MULTIVITAMIN WITH MINERALS) TABS tablet Take 1 tablet by mouth daily.   OLANZapine (ZYPREXA) 5 MG tablet Take 5 mg by mouth daily as needed.   omega-3 acid ethyl esters (LOVAZA) 1 g capsule TAKE TWO CAPSULES BY MOUTH TWICE A DAY   OVER THE COUNTER MEDICATION Senna tablets- 8.6 mg daily   Pyridoxine HCl (VITAMIN B-6 PO) Take 1 tablet by mouth daily.   QUEtiapine (SEROQUEL XR) 300 MG 24 hr tablet Take 2 tablets (600 mg total) by mouth at bedtime. (Patient taking differently: Take 600 mg by mouth at bedtime. Taking 1 400 mg tablet at bedtime, takes 50 mg daily as needed)   QUEtiapine (SEROQUEL) 50 MG tablet Take 50 mg by mouth daily.   timolol (TIMOPTIC) 0.5 % ophthalmic solution SMARTSIG:In Eye(s)   UNABLE TO FIND CPAP   venlafaxine (EFFEXOR-XR) 75 MG 24 hr capsule Take 75 mg by mouth. Take 2 tabs in the am and 1 tab in afternoonn   No facility-administered encounter medications on file as of 06/30/2022.    Allergies (verified) Nitrofurantoin monohyd macro, Statins, Alprazolam, Amoxicillin, Prednisone, and Sulfa antibiotics   History: Past Medical History:  Diagnosis Date   Allergic rhinitis    Allergy    Anxiety    Arthritis    R knee, Left Knee   Asthma    Bipolar disorder ([place])    Cataract    Complication of anesthesia    had bad anxiety after anesthesia   Constipation, chronic    Depression    Diabetes mellitus without complication (HCC)    Dysrhythmia    Palpations occ, takes Tenormin   GERD (gastroesophageal reflux disease)    Glaucoma    Heart murmur    Hx of adenomatous colonic  polyps 2008   Hyperlipidemia    Hypertension    MVP (mitral valve prolapse)    Sleep apnea    wears CPAP   Urinary tract infection    frequent, see Urololgist 12/16/2010   Past Surgical History:  Procedure Laterality Date   ABDOMINAL HYSTERECTOMY  2002   no oophorectomy   BACK SURGERY  2001   L5 - S1   CATARACT EXTRACTION     COLONOSCOPY     NASAL SINUS SURGERY  01/2010   x2   TONSILLECTOMY     TOTAL KNEE ARTHROPLASTY  12/23/2011   Procedure: TOTAL KNEE ARTHROPLASTY;  Surgeon: Quillian Quince  Dennie Bible, MD;  Location: [facility];  Service: Orthopedics;  Laterality: Right;  DR MURPHY WANTS 120 MINUTES FOR SURGERY   TOTAL KNEE ARTHROPLASTY  08/31/2012   Procedure: TOTAL KNEE ARTHROPLASTY;  Surgeon: Ninetta Lights, MD;  Location: [facility];  Service: Orthopedics;  Laterality: Left;   Family History  Problem Relation Age of Onset   Lung disease Mother        Mycobacterium avium intracellulare   Coronary artery disease Father    Heart attack Father 57   Heart disease Father    Hypertension Brother    Hyperlipidemia Brother    Alcohol abuse Brother    Anxiety disorder Brother    Healthy Brother    Diabetes Maternal Grandmother    Stroke Maternal Grandmother    Stroke Maternal Grandfather    Heart attack Paternal Grandmother    Anesthesia problems Neg Hx    Colon cancer Neg Hx    Esophageal cancer Neg Hx    Rectal cancer Neg Hx    Stomach cancer Neg Hx    Social History   Socioeconomic History   Marital status: Single    Spouse name: Not on file   Number of children: 0   Years of education: 13   Highest education level: Some college, no degree  Occupational History   Occupation: retired//disability    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 No kids. Grew up in [place]. Had a great childhood. Has 2 older brothers.   Mom was a stay at home mom. Her dad worked at [employer] as a Banker. He died when pt was 66yo.    Never abused.       Disabled d/t Bipolar since 1992.   Religion-Christian   No legal issues.    Lives alone. Has 1 dog. Has pet sitting business.   2 cups of coffee a day.   Social Determinants of Health   Financial Resource Strain: Low Risk  (06/30/2022)   Overall Financial Resource Strain (CARDIA)    Difficulty of Paying Living Expenses: Not hard at all  Food Insecurity: No Food Insecurity (06/30/2022)   Hunger Vital Sign    Worried About Running Out of Food in the Last Year: Never true    Ran Out of Food in the Last Year: Never true  Transportation Needs: No Transportation Needs (06/30/2022)   PRAPARE - Hydrologist (Medical): No    Lack of Transportation (Non-Medical): No  Physical Activity: Sufficiently Active (06/30/2022)   Exercise Vital Sign    Days of Exercise per Week: 5 days    Minutes of Exercise per Session: 30 min  Stress: No Stress Concern Present (06/30/2022)   [place]    Feeling of Stress : Only a little  Social Connections: Socially Isolated (06/30/2022)   Social Connection and Isolation Panel [NHANES]    Frequency of Communication with Friends and Family: Three  times a week    Frequency of Social Gatherings with Friends and Family: Three times a week    Attends Religious Services: Never    Active Member of Clubs or Organizations: No    Attends Music therapist: Never    Marital Status: Divorced    Tobacco Counseling Counseling given: Not Answered   Clinical Intake:  Pre-visit  preparation completed: Yes  Pain : No/denies pain     Nutritional Risks: None Diabetes: Yes CBG done?: No Did pt. bring in CBG monitor from home?: No  How often do you need to have someone help you when you read instructions, pamphlets, or other written materials from your doctor or pharmacy?: 1 - Never What is the last grade level you completed in school?: college  Diabetic?yes  Nutrition Risk Assessment:  Has the patient had any N/V/D within the last 2 months?  No  Does the patient have any non-healing wounds?  No  Has the patient had any unintentional weight loss or weight gain?  No   Diabetes:  Is the patient diabetic?  Yes  If diabetic, was a CBG obtained today?  No  Did the patient bring in their glucometer from home?  No  How often do you monitor your CBG's? Twice a day .   Financial Strains and Diabetes Management:  Are you having any financial strains with the device, your supplies or your medication? No .  Does the patient want to be seen by Chronic Care Management for management of their diabetes?  No  Would the patient like to be referred to a Nutritionist or for Diabetic Management?  No   Diabetic Exams:  Diabetic Eye Exam: Overdue for diabetic eye exam. Pt has been advised about the importance in completing this exam. Patient advised to call and schedule an eye exam. Diabetic Foot Exam: Overdue, Pt has been advised about the importance in completing this exam. Pt is scheduled for diabetic foot exam on next office visit .   Interpreter Needed?: No  Information entered by :: L.Wilson,LPN   Activities of Daily Living    06/30/2022   10:24 AM 02/27/2022    1:46 PM  In your present state of health, do you have any difficulty performing the following activities:  Hearing? 0 0  Vision? 0 0  Difficulty concentrating or making decisions? 0 0  Walking or climbing stairs? 0 0  Dressing or bathing? 0 0  Doing errands, shopping? 0 0  Preparing Food and eating ?  N N  Using the Toilet? N N  In the past six months, have you accidently leaked urine? N N  Do you have problems with loss of bowel control? N N  Managing your Medications? N N  Managing your Finances? N N  Housekeeping or managing your Housekeeping? N N    Patient Care Team: Midge Minium, MD as PCP - General Jettie Booze, MD as PCP - Cardiology (Cardiology) Midge Minium, MD Megan Salon, MD as Consulting Physician (Gynecology) Jettie Booze, MD as Consulting Physician (Cardiology) Leta Baptist, MD as Consulting Physician (Otolaryngology) Brayton Caves, MD as Referring Physician (Psychiatry) Janeth Rase, NP as Nurse Practitioner (Adult Health Nurse Practitioner) Darlyne Russian, LCSW as Social Worker (Licensed Clinical Social Worker) Star Age, MD as Attending Physician (Neurology) [name], [facility] as Pharmacist (Pharmacist)  Indicate any recent Medical Services you may have received from other than [facility] providers in the past year (date may be approximate).  Assessment:   This is a routine wellness examination for [name].  Hearing/Vision screen Vision Screening - Comments:: Annual eye exams wears reading glasses   Dietary issues and exercise activities discussed: Current Exercise Habits: Home exercise routine, Type of exercise: walking;strength training/weights, Time (Minutes): 30, Frequency (Times/Week): 5, Weekly Exercise (Minutes/Week): 150, Intensity: Mild, Exercise limited by: None identified   Goals Addressed   None    Depression Screen    06/30/2022   10:14 AM 06/30/2022   10:12 AM 06/25/2022   10:14 AM 05/04/2022    9:37 AM 04/14/2022    2:36 PM 04/14/2022    9:39 AM 03/13/2022    1:20 PM  PHQ 2/9 Scores  PHQ - 2 Score 0 0 0 0 1 0 0  PHQ- 9 Score   0 0 4  2    Fall Risk    06/30/2022   10:14 AM 06/25/2022   10:15 AM 05/04/2022    9:36 AM 04/14/2022    2:35 PM 04/14/2022    9:39 AM  Fall Risk   Falls in the past year? 0 1 0 0 0   Number falls in past yr: 0 0 0 0   Injury with Fall? 0 0 0 0   Risk for fall due to :  History of fall(s) No Fall Risks No Fall Risks   Follow up Falls evaluation completed;Education provided Falls evaluation completed Falls evaluation completed Falls evaluation completed     Nina:  Any stairs in or around the home? No  If so, are there any without handrails? No  Home free of loose throw rugs in walkways, pet beds, electrical cords, etc? Yes  Adequate lighting in your home to reduce risk of falls? Yes   ASSISTIVE DEVICES UTILIZED TO PREVENT FALLS:  Life alert? No  Use of a cane, walker or w/c? No  Grab bars in the bathroom? No  Shower chair or bench in shower? No  Elevated toilet seat or a handicapped toilet? No     Cognitive Function: Normal cognitive status assessed by telephone conversation  by this Nurse Health Advisor. No abnormalities found.      08/08/2019   10:57 AM 08/03/2018   10:40 AM 08/13/2015   10:25 AM  MMSE - Mini Mental State Exam  Orientation to time 5 5 5   Orientation to Place 5 5 5   Registration 3 3 3   Attention/ Calculation 5 5 5   Recall 3 2 1   Language- name 2 objects 2 2 2   Language- repeat 1 1 1   Language- follow 3 step command 3 3 3   Language- read & follow direction 1 1 1   Write a sentence 1 1 1   Copy design 1 1 1   Total score 30 29 28         06/30/2022   10:27 AM  6CIT Screen  What Year? 0 points  What month? 0 points  What time? 0 points  Count back from 20 0 points  Months in reverse 0 points  Repeat phrase 0 points  Total Score 0 points    Immunizations Immunization History  Administered Date(s) Administered   Influenza Split 09/02/2012   Influenza,inj,Quad PF,6+ Mos 10/09/2013, 08/13/2015, 08/17/2016, 07/28/2017, 08/03/2018, 08/08/2019, 07/20/2020, 08/05/2021   Moderna Sars-Covid-2 Vaccination 02/27/2019, 01/27/2020, 09/11/2020, 04/13/2021   Pneumococcal Polysaccharide-23 09/02/2012    Tdap 07/15/2012   Zoster Recombinat (Shingrix) 11/10/2018, 06/09/2019    TDAP status: Up to date  Flu Vaccine status: Up to date  Pneumococcal vaccine status: Up to date  Covid-19 vaccine status: Completed vaccines  Qualifies for Shingles Vaccine? Yes   Zostavax completed Yes   Shingrix Completed?: Yes  Screening Tests Health Maintenance  Topic Date Due   MAMMOGRAM  04/28/2022   INFLUENZA VACCINE  06/09/2022   HIV Screening  08/20/2022 (Originally 08/13/1971)   TETANUS/TDAP  07/15/2022   OPHTHALMOLOGY EXAM  09/03/2022   HEMOGLOBIN A1C  12/26/2022   FOOT EXAM  05/05/2023   COLONOSCOPY (Pts 45-65yr Insurance coverage will need to be confirmed)  07/16/2024   Hepatitis C Screening  Completed   Zoster Vaccines- Shingrix  Completed   HPV VACCINES  Aged Out   COVID-19 Vaccine  Discontinued    Health Maintenance  Health Maintenance Due  Topic Date Due   MAMMOGRAM  04/28/2022   INFLUENZA VACCINE  06/09/2022    Colorectal cancer screening: Type of screening: Colonoscopy. Completed 07/16/2021. Repeat every 3 years  Mammogram status: Ordered patient to schedule . Pt provided with contact info and advised to call to schedule appt.   Bone Density status: Ordered not of age . Pt provided with contact info and advised to call to schedule appt.  Lung Cancer Screening: (Low Dose CT Chest recommended if Age 65-80years, 30 pack-year currently smoking OR have quit w/in 15years.) does not qualify.   Lung Cancer Screening Referral: n/a  Additional Screening:  Hepatitis C Screening: does qualify;   Vision Screening: Recommended annual ophthalmology exams for early detection of glaucoma and other disorders of the eye. Is the patient up to date with their annual eye exam?  Yes  Who is the provider or what is the name of the office in which the patient attends annual eye exams? Dr.Tanner  If pt is not established with a provider, would they like to be referred to a provider to  establish care? No .   Dental Screening: Recommended annual dental exams for proper oral hygiene  Community Resource Referral / Chronic Care Management: CRR required this visit?  No   CCM required this visit?  No      Plan:     I have personally reviewed and noted the following in the patient's chart:   Medical and social history Use of alcohol, tobacco or illicit drugs  Current medications and supplements including opioid prescriptions. Patient is not currently taking opioid prescriptions. Functional ability and status Nutritional status Physical activity Advanced directives List of other physicians Hospitalizations, surgeries, and ER visits in previous 12 months Vitals Screenings to include cognitive, depression, and falls Referrals and appointments  In addition, I have reviewed and discussed with patient certain preventive protocols, quality metrics, and best practice recommendations. A written personalized care plan for preventive services as well as general preventive health recommendations were provided to patient.     LDaphane Shepherd LPN   [id number]  Nurse Notes: none

## 2022-07-07 DIAGNOSIS — E119 Type 2 diabetes mellitus without complications: Secondary | ICD-10-CM | POA: Diagnosis not present

## 2022-07-07 DIAGNOSIS — H52203 Unspecified astigmatism, bilateral: Secondary | ICD-10-CM | POA: Diagnosis not present

## 2022-07-07 DIAGNOSIS — H33302 Unspecified retinal break, left eye: Secondary | ICD-10-CM | POA: Diagnosis not present

## 2022-07-07 DIAGNOSIS — H43813 Vitreous degeneration, bilateral: Secondary | ICD-10-CM | POA: Diagnosis not present

## 2022-07-07 DIAGNOSIS — H524 Presbyopia: Secondary | ICD-10-CM | POA: Diagnosis not present

## 2022-07-07 DIAGNOSIS — H401112 Primary open-angle glaucoma, right eye, moderate stage: Secondary | ICD-10-CM | POA: Diagnosis not present

## 2022-07-07 DIAGNOSIS — H04123 Dry eye syndrome of bilateral lacrimal glands: Secondary | ICD-10-CM | POA: Diagnosis not present

## 2022-07-07 LAB — HM DIABETES EYE EXAM

## 2022-07-08 ENCOUNTER — Encounter: Payer: Self-pay | Admitting: Family Medicine

## 2022-07-08 DIAGNOSIS — Z1231 Encounter for screening mammogram for malignant neoplasm of breast: Secondary | ICD-10-CM | POA: Diagnosis not present

## 2022-07-08 LAB — HM MAMMOGRAPHY

## 2022-07-09 ENCOUNTER — Encounter: Payer: Self-pay | Admitting: Family Medicine

## 2022-07-16 ENCOUNTER — Telehealth: Payer: Self-pay | Admitting: Family Medicine

## 2022-07-16 ENCOUNTER — Encounter: Payer: Self-pay | Admitting: Family Medicine

## 2022-07-16 ENCOUNTER — Ambulatory Visit: Payer: Medicare HMO | Admitting: Dietician

## 2022-07-16 ENCOUNTER — Ambulatory Visit: Payer: Medicare Other | Admitting: Family Medicine

## 2022-07-16 VITALS — BP 116/60 | HR 62 | Temp 97.8°F | Resp 16 | Ht 69.0 in | Wt 200.0 lb

## 2022-07-16 DIAGNOSIS — R42 Dizziness and giddiness: Secondary | ICD-10-CM

## 2022-07-16 DIAGNOSIS — Z23 Encounter for immunization: Secondary | ICD-10-CM | POA: Diagnosis not present

## 2022-07-16 NOTE — Progress Notes (Signed)
   Subjective:    Patient ID: [name], female    DOB: 10-Aug-1956, 66 y.o.   MRN: [id number]  HPI Dizziness- pt feels that her balance has been off for ~1 month.  Yesterday morning got up 'pretty quick to use the bathroom and it felt like my balance was off'.  Pt reports she has sxs most frequently upon standing or when walking in the apartment.  Pt is currently on Losartan '50mg'$  daily and Metoprolol XL '50mg'$  daily w/ BP of 116/60 and pulse of 62.   Review of Systems For ROS see HPI     Objective:   Physical Exam Vitals reviewed.  Constitutional:      General: She is not in acute distress.    Appearance: Normal appearance. She is not ill-appearing.  HENT:     Head: Normocephalic and atraumatic.  Cardiovascular:     Rate and Rhythm: Normal rate and regular rhythm.     Pulses: Normal pulses.  Pulmonary:     Effort: Pulmonary effort is normal. No respiratory distress.     Breath sounds: Normal breath sounds.  Skin:    General: Skin is warm and dry.  Neurological:     General: No focal deficit present.     Mental Status: She is alert and oriented to person, place, and time.  Psychiatric:        Mood and Affect: Mood normal.        Behavior: Behavior normal.        Thought Content: Thought content normal.           Assessment & Plan:   Dizziness- new.  Given that her sxs are occurring when she changes positions and have been ongoing for ~1 month (when there has been extreme heat), I suspect she's having orthostasis.  Encouraged increased water and will decrease Metoprolol to 1/2 tab ('25mg'$ ) daily.  Pt expressed understanding and is in agreement w/ plan.

## 2022-07-16 NOTE — Patient Instructions (Signed)
Follow up as needed or as scheduled Continue to drink plenty of fluids DECREASE the Metoprolol to 1/2 tab daily CONTINUE the Losartan 1 tab daily It's ok to exercise!! Call with any questions or concerns Happy Fall!!!

## 2022-07-16 NOTE — Telephone Encounter (Signed)
Caller name: Otis Peak   On DPR? :yes/no: Yes  Call back number: [phone]  Provider they see: Birdie Riddle  Reason for call:  Had appointment with [name] this morning, but wants to speak to [facility] (pharmacist) about her medications. She would like him to call her. Please advise.

## 2022-07-16 NOTE — Telephone Encounter (Signed)
Initial Comment Caller was transferred from the office for a patient that is experiencing dizziness. Translation No Nurse Assessment Nurse: Rolin Barry, RN, Levada Dy Date/Time (Eastern Time): 07/15/2022 11:33:23 AM Confirm and document reason for call. If symptomatic, describe symptoms. ---Caller was transferred from the office for a patient that is experiencing dizziness. Caller advised that she got up this am and started having dizziness. Caller advised that when she is sitting , not dizzy. Does the patient have any new or worsening symptoms? ---Yes Will a triage be completed? ---Yes Related visit to physician within the last 2 weeks? ---No Does the PT have any chronic conditions? (i.e. diabetes, asthma, this includes High risk factors for pregnancy, etc.) ---Yes List chronic conditions. ---Diabetes , type 2 Bi polar HTN , metoprolol , losartan Is this a behavioral health or substance abuse call? ---No Guidelines Guideline Title Affirmed Question Affirmed Notes Nurse Date/Time (Eastern Time) Dizziness - Lightheadedness [1] MODERATE dizziness (e.g., interferes with normal activities) AND [2] has NOT been evaluated by doctor (or NP/PA) Deaton, RN, Levada Dy 07/15/2022 11:36:27 AM PLEASE NOTE: All timestamps contained within this report are represented as [place] Standard Time. CONFIDENTIALTY NOTICE: This fax transmission is intended only for the addressee. It contains information that is legally privileged, confidential or otherwise protected from use or disclosure. If you are not the intended recipient, you are strictly prohibited from reviewing, disclosing, copying using or disseminating any of this information or taking any action in reliance on or regarding this information. If you have received this fax in error, please notify us immediately by telephone so that we can arrange for its return to [place]. Phone: [phone], Toll-Free: [phone], Fax: [phone] Page: 2 of 2 Call Id:  [id number] Guidelines Guideline Title Affirmed Question Affirmed Notes Nurse Date/Time Eilene Ghazi Time) for this (Exception: Dizziness caused by heat exposure, sudden standing, or poor fluid intake.) Disp. Time Eilene Ghazi Time) Disposition Final User 07/15/2022 11:40:29 AM See PCP within 24 Hours Yes Deaton, RN, Levada Dy Final Disposition 07/15/2022 11:40:29 AM See PCP within 24 Hours Yes Deaton, RN, Cindee Lame Disagree/Comply Comply Caller Understands Yes PreDisposition Did not know what to do Care Advice Given Per Guideline SEE PCP WITHIN 24 HOURS: * IF OFFICE WILL BE OPEN: You need to be examined within the next 24 hours. Call your doctor (or NP/PA) when the office opens and make an appointment. DRINK FLUIDS: * Drink several glasses of fruit juice, other clear fluids or water. * This will improve hydration and blood glucose. * If the weather is hot or you have a fever, make sure the fluids are cold. LIE DOWN AND REST: * Lie down with feet elevated for 1 hour. CALL BACK IF: * Passes out (faints) * You become worse CARE ADVICE given per Dizziness (Adult) guideline   Pt has appy today 07/16/22 at 10:40

## 2022-07-16 NOTE — Telephone Encounter (Signed)
Hey!  We will reach out and schedule her a FU.  Learta Codding would you mind giving her a call?

## 2022-07-17 ENCOUNTER — Telehealth: Payer: Self-pay | Admitting: Family Medicine

## 2022-07-17 ENCOUNTER — Emergency Department (HOSPITAL_BASED_OUTPATIENT_CLINIC_OR_DEPARTMENT_OTHER)
Admission: EM | Admit: 2022-07-17 | Discharge: 2022-07-17 | Disposition: A | Discharge status: Home / Self Care | Payer: Medicare Other | Attending: Emergency Medicine | Admitting: Emergency Medicine

## 2022-07-17 ENCOUNTER — Encounter (HOSPITAL_BASED_OUTPATIENT_CLINIC_OR_DEPARTMENT_OTHER): Payer: Self-pay

## 2022-07-17 ENCOUNTER — Other Ambulatory Visit: Payer: Self-pay

## 2022-07-17 DIAGNOSIS — I959 Hypotension, unspecified: Secondary | ICD-10-CM | POA: Insufficient documentation

## 2022-07-17 DIAGNOSIS — E119 Type 2 diabetes mellitus without complications: Secondary | ICD-10-CM | POA: Insufficient documentation

## 2022-07-17 DIAGNOSIS — D649 Anemia, unspecified: Secondary | ICD-10-CM | POA: Diagnosis not present

## 2022-07-17 DIAGNOSIS — J45909 Unspecified asthma, uncomplicated: Secondary | ICD-10-CM | POA: Insufficient documentation

## 2022-07-17 DIAGNOSIS — I10 Essential (primary) hypertension: Secondary | ICD-10-CM | POA: Diagnosis not present

## 2022-07-17 LAB — URINALYSIS, ROUTINE W REFLEX MICROSCOPIC
Bilirubin Urine: NEGATIVE
Glucose, UA: 500 mg/dL — AB
Hgb urine dipstick: NEGATIVE
Ketones, ur: NEGATIVE mg/dL
Leukocytes,Ua: NEGATIVE
Nitrite: NEGATIVE
Protein, ur: NEGATIVE mg/dL
Specific Gravity, Urine: <1.005 — ABNORMAL LOW (ref 1.005–1.030)
pH: 6.5 (ref 5.0–8.0)

## 2022-07-17 LAB — CBC WITH DIFFERENTIAL/PLATELET
Abs Immature Granulocytes: 0 10*3/uL (ref 0.00–0.07)
Basophils Absolute: 0 10*3/uL (ref 0.0–0.1)
Basophils Relative: 0 %
Eosinophils Absolute: 0 10*3/uL (ref 0.0–0.5)
Eosinophils Relative: 0 %
HCT: 33.8 % — ABNORMAL LOW (ref 36.0–46.0)
Hemoglobin: 11.4 g/dL — ABNORMAL LOW (ref 12.0–15.0)
Immature Granulocytes: 0 %
Lymphocytes Relative: 36 %
Lymphs Abs: 1.3 10*3/uL (ref 0.7–4.0)
MCH: 30.9 pg (ref 26.0–34.0)
MCHC: 33.7 g/dL (ref 30.0–36.0)
MCV: 91.6 fL (ref 80.0–100.0)
Monocytes Absolute: 0.3 10*3/uL (ref 0.1–1.0)
Monocytes Relative: 7 %
Neutro Abs: 2.1 10*3/uL (ref 1.7–7.7)
Neutrophils Relative %: 57 %
Platelets: 202 10*3/uL (ref 150–400)
RBC: 3.69 MIL/uL — ABNORMAL LOW (ref 3.87–5.11)
RDW: 14.1 % (ref 11.5–15.5)
WBC: 3.7 10*3/uL — ABNORMAL LOW (ref 4.0–10.5)
nRBC: 0 % (ref 0.0–0.2)

## 2022-07-17 LAB — COMPREHENSIVE METABOLIC PANEL
ALT: 15 U/L (ref 0–44)
AST: 14 U/L — ABNORMAL LOW (ref 15–41)
Albumin: 4.9 g/dL (ref 3.5–5.0)
Alkaline Phosphatase: 21 U/L — ABNORMAL LOW (ref 38–126)
Anion gap: 10 (ref 5–15)
BUN: 22 mg/dL (ref 8–23)
CO2: 29 mmol/L (ref 22–32)
Calcium: 10 mg/dL (ref 8.9–10.3)
Chloride: 103 mmol/L (ref 98–111)
Creatinine, Ser: 0.87 mg/dL (ref 0.44–1.00)
GFR, Estimated: >60 mL/min (ref >60)
Glucose, Bld: 115 mg/dL — ABNORMAL HIGH (ref 70–99)
Potassium: 3.7 mmol/L (ref 3.5–5.1)
Sodium: 142 mmol/L (ref 135–145)
Total Bilirubin: 0.5 mg/dL (ref 0.3–1.2)
Total Protein: 7.2 g/dL (ref 6.5–8.1)

## 2022-07-17 MED ORDER — ACETAMINOPHEN 500 MG PO TABS
500.0000 mg | ORAL_TABLET | Freq: Once | ORAL | Status: AC
  Administered 2022-07-17: 500 mg via ORAL
  Filled 2022-07-17: qty 1

## 2022-07-17 NOTE — ED Provider Notes (Signed)
[facility] EMERGENCY DEPT Provider Note   CSN: [id number] Arrival date & time: 07/17/22  1629     History  Chief Complaint  Patient presents with   Hypotension    [name] is a 66 y.o. female.  HPI Patient presents with hypotension.  Has had a dull headache.  Had been seen by PCP yesterday who adjusted medicines.  Is cut back on her metoprolol.  However states blood pressure was little low at 100 again today.  Feels little fatigued.  This been going for a while however.  No blood in stool.  No black stools.  No confusion.  No fevers.   Past Medical History:  Diagnosis Date   Allergic rhinitis    Allergy    Anxiety    Arthritis    R knee, Left Knee   Asthma    Bipolar disorder ([place])    Cataract    Complication of anesthesia    had bad anxiety after anesthesia   Constipation, chronic    Depression    Diabetes mellitus without complication (HCC)    Dysrhythmia    Palpations occ, takes Tenormin   GERD (gastroesophageal reflux disease)    Glaucoma    Heart murmur    Hx of adenomatous colonic polyps 2008   Hyperlipidemia    Hypertension    MVP (mitral valve prolapse)    Sleep apnea    wears CPAP   Urinary tract infection    frequent, see Urololgist 12/16/2010    Home Medications Prior to Admission medications   Medication Sig Start Date End Date Taking? Authorizing Provider  Alirocumab (PRALUENT) 150 MG/ML SOAJ INJECT 150MG INTO THE SKIN EVERY 14 DAYS 01/09/22   Jettie Booze, MD  ALPHAGAN P 0.1 % SOLN Place 1 drop into both eyes every 8 (eight) hours.  05/03/20   [provider]  azelastine (ASTELIN) 0.1 % nasal spray PLACE 2 SPRAYS INTO EACH NOSTRIL TWO TIMES A DAY AS DIRECTED 08/22/21   Midge Minium, MD  blood glucose meter kit and supplies KIT Dispense based on patient and insurance preference. Use up to four times daily as directed. 04/15/22   Midge Minium, MD  budesonide-formoterol (SYMBICORT) 160-4.5 MCG/ACT inhaler  INHALE TWO PUFFS BY MOUTH EVERY MORNING AND INHALE TWO PUFFS BY MOUTH EVERY NIGHT AT BEDTIME 04/08/22   Brand Males, MD  Cholecalciferol (VITAMIN D3) 50 MCG (2000 UT) CAPS Take 2,000 Units by mouth daily.    [provider]  Cholecalciferol 1.25 MG (50000 UT) TABS Take 1 tablet by mouth once a week. 02/13/22   Lesleigh Noe, MD  CLARAVIS 20 MG capsule Take 20 mg by mouth daily. 07/11/21   [provider]  clonazePAM (KLONOPIN) 2 MG tablet 1 mg 2 (two) times daily.  10/23/15   [provider]  Cyanocobalamin (VITAMIN B-12 PO) Take by mouth.    [provider]  empagliflozin (JARDIANCE) 10 MG TABS tablet Take 1 tablet (10 mg total) by mouth daily before breakfast. 05/25/22   Midge Minium, MD  fenofibrate (TRICOR) 145 MG tablet Take 1 tablet (145 mg total) by mouth daily. 02/09/22   Midge Minium, MD  fexofenadine (ALLEGRA) 180 MG tablet Take 1 tablet (180 mg total) by mouth daily. 08/05/21   Maximiano Coss, NP  fluticasone Asencion Islam) 50 MCG/ACT nasal spray SPRAY TWO SPRAYS IN EACH NOSTRIL ONCE DAILY 03/17/22   Midge Minium, MD  gabapentin (NEURONTIN) 600 MG tablet Take 1,200 mg by mouth  2 (two) times daily.     [provider]  glucose blood test strip Use as instructed 04/15/22   Midge Minium, MD  lamoTRIgine (LAMICTAL) 200 MG tablet Take 200 mg by mouth 2 (two) times daily.    [provider]  Lancets [facility] DELICA PLUS [id number]) MISC Use twice daily to test blood sugars as directed Dx- uncontrolled DM II 06/25/22   Midge Minium, MD  losartan (COZAAR) 50 MG tablet Take 1 tablet (50 mg total) by mouth daily. 05/27/22   Midge Minium, MD  LUMIGAN 0.01 % SOLN Place 1 drop into both eyes at bedtime.  10/19/19   [provider]  meloxicam (MOBIC) 15 MG tablet Take 1 tablet (15 mg total) by mouth daily. 10/17/21   Maximiano Coss, NP  metoprolol succinate (TOPROL-XL) 50 MG 24 hr tablet TAKE ONE TABLET BY  MOUTH DAILY WITH A MEAL 06/10/22   Midge Minium, MD  miconazole (MONISTAT 7) 2 % vaginal cream Apply to red, burning areas twice daily as needed Patient not taking: Reported on 07/16/2022 03/25/22   Midge Minium, MD  montelukast (SINGULAIR) 10 MG tablet Take 1 tablet (10 mg total) by mouth at bedtime. 07/12/20   Midge Minium, MD  Multiple Vitamin (MULTIVITAMIN WITH MINERALS) TABS tablet Take 1 tablet by mouth daily.    [provider]  OLANZapine (ZYPREXA) 5 MG tablet Take 5 mg by mouth daily as needed.    [provider]  omega-3 acid ethyl esters (LOVAZA) 1 g capsule TAKE TWO CAPSULES BY MOUTH TWICE A DAY 02/04/22   Jettie Booze, MD  OVER THE COUNTER MEDICATION Senna tablets- 8.6 mg daily    [provider]  Pyridoxine HCl (VITAMIN B-6 PO) Take 1 tablet by mouth daily.    [provider]  QUEtiapine (SEROQUEL XR) 300 MG 24 hr tablet Take 2 tablets (600 mg total) by mouth at bedtime. Patient taking differently: Take 600 mg by mouth at bedtime. Taking 1 400 mg tablet at bedtime, takes 50 mg daily as needed 04/09/21   Donnal Moat T, PA-C  QUEtiapine (SEROQUEL) 50 MG tablet Take 50 mg by mouth daily. 04/29/22   [provider]  timolol (TIMOPTIC) 0.5 % ophthalmic solution SMARTSIG:In Eye(s) 01/27/22   [provider]  UNABLE TO FIND CPAP    [provider]  venlafaxine (EFFEXOR-XR) 75 MG 24 hr capsule Take 75 mg by mouth. Take 2 tabs in the am and 1 tab in afternoonn    [provider]      Allergies    Nitrofurantoin monohyd macro, Statins, Alprazolam, Amoxicillin, Prednisone, and Sulfa antibiotics    Review of Systems   Review of Systems  Physical Exam Updated Vital Signs BP 125/79   Pulse 71   Temp 98.5 F (36.9 C) (Oral)   Resp 18   Ht 5' 9" (1.753 m)   Wt 90.7 kg   LMP 11/09/2000   SpO2 95%   BMI 29.53 kg/m  Physical Exam Vitals and nursing note reviewed.  Eyes:     Pupils: Pupils are  equal, round, and reactive to light.  Cardiovascular:     Rate and Rhythm: Regular rhythm.  Abdominal:     Tenderness: There is no abdominal tenderness.  Musculoskeletal:        General: No tenderness.     Cervical back: Neck supple.  Skin:    General: Skin is warm.  Neurological:     Mental Status:  She is alert and oriented to person, place, and time.     ED Results / Procedures / Treatments   Labs (all labs ordered are listed, but only abnormal results are displayed) Labs Reviewed  CBC WITH DIFFERENTIAL/PLATELET - Abnormal; Notable for the following components:      Result Value   WBC 3.7 (*)    RBC 3.69 (*)    Hemoglobin 11.4 (*)    HCT 33.8 (*)    All other components within normal limits  COMPREHENSIVE METABOLIC PANEL - Abnormal; Notable for the following components:   Glucose, Bld 115 (*)    AST 14 (*)    Alkaline Phosphatase 21 (*)    All other components within normal limits  URINALYSIS, ROUTINE W REFLEX MICROSCOPIC - Abnormal; Notable for the following components:   Specific Gravity, Urine <1.005 (*)    Glucose, UA 500 (*)    All other components within normal limits    EKG None  Radiology No results found.  Procedures Procedures    Medications Ordered in ED Medications  acetaminophen (TYLENOL) tablet 500 mg (has no administration in time range)    ED Course/ Medical Decision Making/ A&P                           Medical Decision Making Amount and/or Complexity of Data Reviewed Labs: ordered.   Patient with hypotension at home.  Has had medication adjustment however we may still be too early to have had an effect for it.  Blood pressure here is not hypotensive.  Lab work done and reassuring but does have a mild anemia.  Down 2 g since 3 months ago.  Denies black stools.  Discussed this could be a potential cause of some hypotension.  Discussed about rectal exam but has deferred at this time.  States she has follow-up with [name] on Monday with  today being Friday.  We will follow more at that time.  Has had previous colonoscopy.  Potentially too early for the blood pressure adjustments to have taken effect.  Appears stable for discharge.  Will return for worsening symptoms.        Final Clinical Impression(s) / ED Diagnoses Final diagnoses:  None    Rx / DC Orders ED Discharge Orders     None         Davonna Belling, MD 07/17/22 1949

## 2022-07-17 NOTE — Telephone Encounter (Signed)
Pt called; states that she is not feeling any better today; states that her BP is still low (reports last check of 100/70); called health team nurse line for pt; advised of pt symptoms. They are going to return her call.

## 2022-07-17 NOTE — ED Triage Notes (Signed)
Pt went to PCP yesterday- BP was 100/62  Pt takes metoprolol and losartan- just cut dose of metoprolol in half yesterday.  Pt has been feeling fatigued.

## 2022-07-17 NOTE — Telephone Encounter (Signed)
Appt made for 07/22/22 @ 10am Dwana Curd

## 2022-07-17 NOTE — Discharge Instructions (Signed)
Follow-up with [name] on Monday as planned.  Return for worsening lightheadedness or dizziness.  Return for bleeding.

## 2022-07-20 ENCOUNTER — Telehealth: Payer: Self-pay | Admitting: Family Medicine

## 2022-07-20 ENCOUNTER — Encounter: Payer: Self-pay | Admitting: Family Medicine

## 2022-07-20 ENCOUNTER — Ambulatory Visit: Payer: Medicare Other | Admitting: Family Medicine

## 2022-07-20 ENCOUNTER — Other Ambulatory Visit: Payer: Self-pay

## 2022-07-20 DIAGNOSIS — F419 Anxiety disorder, unspecified: Secondary | ICD-10-CM | POA: Diagnosis not present

## 2022-07-20 MED ORDER — BUSPIRONE HCL 7.5 MG PO TABS: 7.5000 mg | ORAL_TABLET | Freq: Two times a day (BID) | ORAL | 3 refills | Status: DC

## 2022-07-20 NOTE — Progress Notes (Signed)
   Subjective:    Patient ID: [name], female    DOB: 04-06-1956, 66 y.o.   MRN: [id number]  HPI ER f/u- pt went to ER on 9/8 w/ concern of hypotension.  SBP 100 at home.  BP in ER 125/79.  Labs were unremarkable.  Pt was sent home to f/u today.  She reports 'i feel sick'.  + nausea, 'i feel nervous- very jittery'.  + fatigue.  Pt reports she started 'feeling sick' after our appt on Thursday.  We decreased her Metoprolol at that time- now taking half tab.  Pt also decreased Losartan to 1/2 tab.  CBG this morning 135.  Was 114 last night.  Feels like Jardiance may be the problem.  This is after she asked me to switch her back to [place] b/c she was having similar sxs on Januvia.  Reports sxs improve w/ Clonazepam but return as the medication wears off.  Pt has been on Buspar in the past and it worked well.   Review of Systems For ROS see HPI     Objective:   Physical Exam Vitals reviewed.  Constitutional:      General: She is not in acute distress.    Appearance: Normal appearance. She is not ill-appearing.  HENT:     Head: Normocephalic and atraumatic.  Eyes:     Extraocular Movements: Extraocular movements intact.     Conjunctiva/sclera: Conjunctivae normal.     Pupils: Pupils are equal, round, and reactive to light.  Cardiovascular:     Rate and Rhythm: Normal rate and regular rhythm.     Pulses: Normal pulses.     Heart sounds: Normal heart sounds. No murmur heard. Pulmonary:     Effort: Pulmonary effort is normal. No respiratory distress.     Breath sounds: Normal breath sounds. No wheezing or rhonchi.  Abdominal:     General: There is no distension.     Palpations: Abdomen is soft.     Tenderness: There is no abdominal tenderness. There is no guarding.  Musculoskeletal:     Cervical back: Normal range of motion and neck supple.  Lymphadenopathy:     Cervical: No cervical adenopathy.  Skin:    General: Skin is warm and dry.     Findings: No rash.  Neurological:      General: No focal deficit present.     Mental Status: She is alert and oriented to person, place, and time.     Motor: No weakness.     Coordination: Coordination normal.     Gait: Gait normal.  Psychiatric:     Comments: anxious           Assessment & Plan:

## 2022-07-20 NOTE — Telephone Encounter (Signed)
Initial Comment Caller states states she has a pt who needs to talk to the nurse the pt is having low blood pressure.100/70 Translation No Nurse Assessment Nurse: Tressia Danas, RN, Holly Date/Time (Eastern Time): 07/17/2022 3:37:28 PM Confirm and document reason for call. If symptomatic, describe symptoms. ---caller states she has already talked to a Nurse. does not know who Elberta Spaniel is unless she is from the office. caller states she does not feel any different from when she talk to the previous nurse. Does the patient have any new or worsening symptoms? ---No Disp. Time Eilene Ghazi Time) Disposition Final User 07/17/2022 3:40:57 PM Clinical Call Yes Tressia Danas, RN, [facility] Final Disposition 07/17/2022 3:40:57 PM Clinical Call Yes [place], RN, Holly   Pt has appt on 07/20/22

## 2022-07-20 NOTE — Assessment & Plan Note (Signed)
Deteriorated.  Pt has been following w/ psych to manage her bipolar and anxiety but has really struggled recently w/ her anxiety.  She has had a few health issues- high CBGs, hypotension- which has worsened her anxiety.  Her current sxs of nausea, feeling jittery, and nervous improve w/ use of Clonazepam but return once the medication wears off.  Discussed that her Vania Rea is working well to control her sugars and I don't think her current sxs are medication related as she felt fine on this previously.  Suspect her physical sxs are all due to uncontrolled anxiety.  Encouraged her to schedule w/ psych but she reports they are putting her off.  Will start Buspar twice daily and she is to follow up w/ psych.  Pt expressed understanding and is in agreement w/ plan.

## 2022-07-20 NOTE — Patient Instructions (Signed)
Follow up as needed or as scheduled START the Buspirone (Buspar) twice daily to help w/ anxiety Make sure you follow up w/ psychiatry to help adjust your medications Continue the 1/2 tab of Metoprolol and 1/2 tab of Losartan daily Continue the Jardiance Make sure you are drinking LOTS of water and eating regularly Call with any questions or concerns Hang in there!!!

## 2022-07-20 NOTE — Telephone Encounter (Signed)
Initial Comment Caller states she is from office. The patient was in the office yesterday for dizziness and had a medication changed. She is not feeling better and has a BP of 100/70. Translation No Nurse Assessment Nurse: Primary school teacher, RN, Joelene Millin Date/Time (Eastern Time): 07/17/2022 2:59:08 PM Confirm and document reason for call. If symptomatic, describe symptoms. ---Caller states that her BP has been low and she was seen yesterday at the [facility] office and they cut her Metoprolol in half, but she still feels week today. BP 108/73 HR 67 taken within the last hour. She feels fatigue. Does the patient have any new or worsening symptoms? ---Yes Will a triage be completed? ---Yes Related visit to physician within the last 2 weeks? ---Yes Does the PT have any chronic conditions? (i.e. diabetes, asthma, this includes High risk factors for pregnancy, etc.) ---Yes List chronic conditions. ---HTN, bipolar, DM Is this a behavioral health or substance abuse call? ---No Guidelines Guideline Title Affirmed Question Affirmed Notes Nurse Date/Time (Eastern Time) Blood Pressure - Low [1] Systolic BP 10-315 AND [9] taking blood pressure medications AND Schreffler, RN, Joelene Millin 07/17/2022 3:04:26 PM PLEASE NOTE: All timestamps contained within this report are represented as [place] Standard Time. CONFIDENTIALTY NOTICE: This fax transmission is intended only for the addressee. It contains information that is legally privileged, confidential or otherwise protected from use or disclosure. If you are not the intended recipient, you are strictly prohibited from reviewing, disclosing, copying using or disseminating any of this information or taking any action in reliance on or regarding this information. If you have received this fax in error, please notify us immediately by telephone so that we can arrange for its return to [place]. Phone: [phone], Toll-Free: [phone], Fax: [phone] Page: 2 of  2 Call Id: [id number] Guidelines Guideline Title Affirmed Question Affirmed Notes Nurse Date/Time Eilene Ghazi Time) [3] NOT dizzy, lightheaded or weak Disp. Time Eilene Ghazi Time) Disposition Final User 07/17/2022 3:15:01 PM See PCP within 24 Hours Yes Schreffler, RN, Joelene Millin Final Disposition 07/17/2022 3:15:01 PM See PCP within 24 Hours Yes Schreffler, RN, Renea Ee Disagree/Comply Comply Caller Understands Yes PreDisposition Call Doctor Care Advice Given Per Guideline * IF OFFICE WILL BE OPEN: You need to be examined within the next 24 hours. Call your doctor (or NP/PA) when the office opens and make an appointment. SEE PCP WITHIN 24 HOURS: CALL BACK IF: * Lightheadedness, weakness, or dizziness occurs * Systolic BP under 90 * You feel sick * You become worse CARE ADVICE given per Low Blood Pressure (Adult) guideline. Referrals REFERRED TO PCP OFFICE  Pt has appt 07/20/22

## 2022-07-22 ENCOUNTER — Ambulatory Visit: Payer: Medicare Other | Admitting: Pharmacist

## 2022-07-22 DIAGNOSIS — I10 Essential (primary) hypertension: Secondary | ICD-10-CM

## 2022-07-22 DIAGNOSIS — E1165 Type 2 diabetes mellitus with hyperglycemia: Secondary | ICD-10-CM

## 2022-07-22 NOTE — Patient Instructions (Addendum)
Visit Information   Goals Addressed             This Visit's Progress    Patient Assistance Program   On track    CARE PLAN ENTRY (see longitudinal plan of care for additional care plan information)  Current Barriers:  Financial Barriers: patient has [employer] and reports copay is cost prohibitive at this time. Requesting assistance with Alphagan Lumigan Praluent  Pharmacist Clinical Goal(s):  Over the next 30 days, patient will work with PharmD and providers to relieve medication access concerns  Interventions: Comprehensive medication review completed; medication list updated in electronic medical record.  Inter-disciplinary care team collaboration (see longitudinal plan of care) Reviewed application process. Patient will provide proof of income, out of pocket spend report, and will sign application. Will collaborate with corresponding prescribers for their portion of application. Once completed, will submit to patient assistance program.  Patient Self Care Activities:  Patient will provide necessary portions of application   Initial goal documentation.        Patient Care Plan: CCM Pharmacy Care Plan     Problem Identified: HLD HTN DMII GERD      Long-Range Goal: Disease Management   Start Date: 01/10/2021  Expected End Date: 01/10/2022  Recent Progress: On track  Priority: High  Note:    Current Barriers:  Praluent copay  Pharmacist Clinical Goal(s):  Patient will verbalize ability to afford treatment regimen through collaboration with PharmD and provider.   Interventions: 1:1 collaboration with Midge Minium, MD regarding development and update of comprehensive plan of care as evidenced by provider attestation and co-signature Inter-disciplinary care team collaboration (see longitudinal plan of care) Comprehensive medication review performed; medication list updated in electronic medical record  Hyperlipidemia: (LDL goal < 70)  07/22/22 -Controlled -Current treatment: Praluent 150 150 mg injection Appropriate, Effective, Safe, Accessible Fenofibrate 145 mg once daily Appropriate, Effective, Safe, Accessible Lovaza 1g 2 capsules po BID Appropriate, Effective, Safe, Accessible -Medications previously tried: statin intolerant  -Current dietary patterns:working on lifestyle mods, less sweets -Current exercise habits: also as made plans to exercise, will consider getting back to the gym. Had tried to go before covid, will likely start back up. -Educated on Cholesterol goals;  Reports that her Praluent is no longer free, checked [employer] and she is approved for a grant through February 2024.  Will contact the pharmacy to see if they need billing information.  She has not missed any doses but would not be able to afford next refill. [employer] grant should cover this and Lovaza. Will contact pharmacy for coordination.  Diabetes (A1c goal <7%) 07/22/22 -Controlled, most A1c 6.6% -Current medications: Jardiance 27m Appropriate, Query effective, ,  -Current home glucose readings fasting glucose: 100-135 is her normal fasting readings -Educated on A1c and blood sugar goals; -Note dietary/exercise changes in HLD -Counseled on diet and exercise extensively -She is tolerating Jardiance much better than previous medications.  She has seen improvement in A1c and home glucose readings. She has made great strides in lifestyle mods, encouraged her to continue.  Patient Goals/Self-Care Activities Patient will:  - take medications as prescribed target a minimum of 150 minutes of moderate intensity exercise weekly  Follow Up Plan: will contact pharmacy to ensure [employer] grant coordination of benefits is applied again.       Patient Care Plan: RN Care Manager Plan of Care  Completed 06/17/2022   Problem Identified: Chronic Disease Management and Care Coordination Needs (DM,HTN ,HLD, Vitamin D deficiency) Resolved 06/17/2022  Priority: High     Long-Range Goal: Establish Plan of Care for Chronic Disease Management Needs (DM,HTN ,HLD, Vitamin D deficiency) Completed 06/17/2022  Start Date: 02/18/2022  Expected End Date: 02/18/2023  Priority: High  Note:   Case closed unable to contact maintain  Current Barriers:  Knowledge Deficits related to plan of care for management of HTN, HLD, DMII, Bipolar Disorder, and Vitamin D Deficiency    Care Coordination needs related to Limited social support and Mental Health Concerns  Chronic Disease Management support and education needs related to HTN, HLD, DMII, Bipolar Disorder, and  Vitamin D Deficiency   States that meeting with the RD has been helpful.  States she is now starting to check her CBGs but she is still learning how to do them. States that the RD showed her how to use the glucometer but she is not sure she is doing it right.  States her reading was 155 this morning. States she has been trying to cut out sweets but it is struggle at times.  States she does walk her dog 30 minutes a day and she wants to start going to the gym.  States she is having issues with stress and coping with her illness and will see her therapist.   Denies and chest pains, shortness of breath or swelling.  States she does not been checking her B/P at home.  RNCM Clinical Goal(s):  Patient will verbalize understanding of plan for management of HTN, HLD, DMII, Bipolar Disorder, and  Vitamin D Deficiency  as evidenced by voiced adherence to plan of care verbalize basic understanding of  HTN, HLD, DMII, Bipolar Disorder, and  Vitamin D Deficiency  disease process and self health management plan as evidenced by voiced understanding and teach back  take all medications exactly as prescribed and will call provider for medication related questions as evidenced by dispense report and pt verbalization  attend all scheduled medical appointments: RD 06/08/22, Neurology 09/09/22 as evidenced by medical  records demonstrate Improved adherence to prescribed treatment plan for HTN, HLD, DMII, Bipolar Disorder, and  Vitamin D Deficiency  as evidenced by readings within limits, voiced adherence to plan of care continue to work with RN Care Manager to address care management and care coordination needs related to  HTN, HLD, DMII, Bipolar Disorder, and  Vitamin D Deficiency  as evidenced by adherence to CM Team Scheduled appointments work with Education officer, museum to address  related to the management of Limited social support, Mental Health Concerns , and stress of illnesses related to the management of HTN, HLD, DMII, Bipolar Disorder, and  Vitamin D Deficiency  as evidenced by review of EMR and patient or social worker report through collaboration with Consulting civil engineer, provider, and care team.   Interventions: 1:1 collaboration with primary care provider regarding development and update of comprehensive plan of care as evidenced by provider attestation and co-signature Inter-disciplinary care team collaboration (see longitudinal plan of care) Evaluation of current treatment plan related to  self management and patient's adherence to plan as established by provider [place]  calendar and  Booklet: How to Thrive: A Guide for Your Journey with diabetes    Diabetes Interventions:  (Status:  Gaol Not Met.) Long Term Goal Assessed patient's understanding of A1c goal: <7% Provided education to patient about basic DM disease process Reviewed medications with patient and discussed importance of medication adherence Counseled on importance of regular laboratory monitoring as prescribed Discussed plans with patient for ongoing care  management follow up and provided patient with direct contact information for care management team Provided patient with written educational materials related to hypo and hyperglycemia and importance of correct treatment Advised patient, providing education and  rationale, to check cbg 1-2 times a day and record, calling provider for findings outside established parameters Review of patient status, including review of consultants reports, relevant laboratory and other test results, and medications completed Reviewed use of glucometer and how to get a good fingerstick specimen. Reviewed fasting blood sugar goals of 80-130 and less than 180 1 1/2-2 hours after meals. Reviewed is read labels for CHO count and reviewed free phone apps to help count CHO   Lab Results  Component Value Date   HGBA1C 8.2 (A) 04/14/2022    Vitamin D Deficiency   (Status:  Goal Met.)  Long Term Goal Evaluation of current treatment plan related to   Vitamin D Deficiency  , Limited social support and Mental Health Concerns  self-management and patient's adherence to plan as established by provider. Discussed plans with patient for ongoing care management follow up and provided patient with direct contact information for care management team Evaluation of current treatment plan related to  Vitamin D Deficiency  and patient's adherence to plan as established by provider Advised patient to eat foods with higher vitamin D and get sunlight Provided education to patient re:  Vitamin D Deficiency  Reviewed medications with patient and discussed prescribed higher dose of vitamin D Discussed plans with patient for ongoing care management follow up and provided patient with direct contact information for care management team Reviewed to get sunlight exposure regularly  Hyperlipidemia Interventions:  (Status:  Goal Met.) Long Term Goal Medication review performed; medication list updated in electronic medical record.  Provider established cholesterol goals reviewed Counseled on importance of regular laboratory monitoring as prescribed Reviewed importance of limiting foods high in cholesterol Reviewed exercise goals and target of 150 minutes per week Reviewed to continue walking and to try  to get to gym  Hypertension Interventions:  (Status:  Goal Met.) Long Term Goal Last practice recorded BP readings:  BP Readings from Last 3 Encounters:  04/14/22 136/88  04/08/22 126/78  03/25/22 112/82  Most recent eGFR/CrCl:  Lab Results  Component Value Date   EGFR 93 09/08/2021    No components found for: CRCL  Evaluation of current treatment plan related to hypertension self management and patient's adherence to plan as established by provider Provided education to patient re: stroke prevention, s/s of heart attack and stroke Reviewed medications with patient and discussed importance of compliance Counseled on the importance of exercise goals with target of 150 minutes per week Discussed plans with patient for ongoing care management follow up and provided patient with direct contact information for care management team Advised patient, providing education and rationale, to monitor blood pressure daily and record, calling PCP for findings outside established parameters Provided education on prescribed diet low sodium low CHO  Patient Goals/Self-Care Activities: Take all medications as prescribed Attend all scheduled provider appointments Call pharmacy for medication refills 3-7 days in advance of running out of medications Perform all self care activities independently  Call provider office for new concerns or questions  keep appointment with eye doctor check blood sugar at prescribed times: twice daily and when you have symptoms of low or high blood sugar check feet daily for cuts, sores or redness take the blood sugar log to all doctor visits drink 6 to 8 glasses of water  each day eat fish at least once per week fill half of plate with vegetables limit fast food meals to no more than 1 per week manage portion size switch to sugar-free drinks check blood pressure weekly choose a place to take my blood pressure (home, clinic or office, retail store) take blood pressure  log to all doctor appointments eat more whole grains, fruits and vegetables, lean meats and healthy fats limit salt intake to 2320m/day call for medicine refill 2 or 3 days before it runs out take all medications exactly as prescribed call doctor with any symptoms you believe are related to your medicine call doctor when you experience any new symptoms  Follow Up Plan:  The patient has been provided with contact information for the care management team and has been advised to call with any health related questions or concerns.  No further follow up required: Case closed unable to contact maintain       Patient Care Plan: LCSW Plan of Care     Problem Identified: Reduce and Manage My Symptoms of Bipolar I Affective Disorder and Depression.   Priority: High     Goal: Reduce and Manage My Symptoms of Bipolar I Affective Disorder and Depression.   Start Date: 02/26/2022  Expected End Date: 04/28/2022  This Visit's Progress: On track  Recent Progress: On track  Priority: High  Note:   Current Barriers:   Acute Mental Health needs related to Depressive Disorder, Bipolar I Affective Disorder, and Attention Deficit Disorder, requires Support, Education, Resources, Referrals, Advocacy, and Care Coordination, in order to meet unmet Acute Mental Health needs. Clinical Goal(s):  Patient will work with LCSW, to reduce and manage symptoms of Depression and Bipolar I Affective Disorder, until re-established with mental health counseling and supportive services.       Patient will increase knowledge and/or ability of:        Coping Skills, Healthy Habits, Self-Management Skills, Stress Reduction, Home Safety, and Utilizing [employer] Resources.   Interventions: Collaboration with Primary Care Physician, Dr. KAnnye Asaregarding development and update of comprehensive plan of care, as evidenced by provider attestation and co-signature. Inter-disciplinary care team collaboration (see  longitudinal plan of care). Clinical Interventions:  Mindfulness Meditation Strategies, Relaxation Techniques and Deep Breathing Exercises reviewed, and encouraged daily. Solution-Focused Therapy performed. Verbalization of Feelings encouraged. Emotional Support provided. Problem Solving Solutions developed. Cognitive Behavioral Therapy initiated. Patient Goals/Self-Care Activities: Continue to receive personal counseling with LCSW, on a bi-weekly basis, to reduce and manage symptoms of Depression and Bipolar I Affective Disorder, until well-controlled.   Keep follow-up appointments [facility]rist, [facility]([phone], for psychotropic medication management. Keep follow-up appointments with Nurse Practitioner, [phone], for management of mood stabilization. Keep follow-up appointments with Licensed Clinical Social Worker, LJasmine Awewith [facility]([phone], for psychotherapeutic counseling services.    Continue to incorporate into daily practice - relaxation techniques, deep breathing exercises and mindfulness meditation strategies. Thorough review of EMMI educational material on "Depression in Adults", "Tips for How to Help Your Mood", and "Bipolar Disorder", to ensure understanding. Contact LCSW directly (# [phone], if you have questions, need assistance, or if additional social work needs are identified between now and our next scheduled telephone outreach call.  Follow-Up:  03/26/2022 at 12:45 pm      The patient verbalized understanding of instructions, educational materials, and care plan provided today and DECLINED offer to receive copy  of patient instructions, educational materials, and care plan.  Telephone follow up appointment with pharmacy team member scheduled for: 6 months  Edythe Clarity, [place], PharmD Clinical  Pharmacist  [facility] [phone]

## 2022-07-22 NOTE — Progress Notes (Signed)
Chronic Care Management Pharmacy Note Summary: PharmD FU visit.  She is tolerating Jardiance well, A1c is improving.  Having difficulty with Praluent copay she is still under grant program for.  No other complaints at this time.  FU - coordinate grant with pharmacy, 6 month FU call  07/22/2022 Name:  [name] MRN:  [id number] DOB:  12-Aug-1956  Subjective: [name] is an 66 y.o. year old female who is a primary patient of Tabori, Aundra Millet, MD.  The CCM team was consulted for assistance with disease management and care coordination needs.    Engaged with patient by telephone for follow up visit in response to provider referral for pharmacy case management and/or care coordination services.   Consent to Services:  The patient was given information about Chronic Care Management services, agreed to services, and gave verbal consent prior to initiation of services.  Please see initial visit note for detailed documentation.   Patient Care Team: Midge Minium, MD as PCP - General Jettie Booze, MD as PCP - Cardiology (Cardiology) Midge Minium, MD Megan Salon, MD as Consulting Physician (Gynecology) Jettie Booze, MD as Consulting Physician (Cardiology) Leta Baptist, MD as Consulting Physician (Otolaryngology) Brayton Caves, MD as Referring Physician (Psychiatry) Janeth Rase, NP as Nurse Practitioner (Adult Health Nurse Practitioner) Darlyne Russian, LCSW as Social Worker (Licensed Clinical Social Worker) Star Age, MD as Attending Physician (Neurology) Edythe Clarity, [facility] (Pharmacist) Objective:  Lab Results  Component Value Date   CREATININE 0.87 07/17/2022   CREATININE 0.74 06/25/2022   CREATININE 0.83 05/04/2022    Lab Results  Component Value Date   HGBA1C 6.6 (H) 06/25/2022   Last diabetic Eye exam:  Lab Results  Component Value Date/Time   HMDIABEYEEXA No Retinopathy 07/07/2022 12:00 AM    Last diabetic Foot exam: No  results found for: "HMDIABFOOTEX"      Component Value Date/Time   CHOL 158 12/16/2021 1352   CHOL 163 11/14/2018 1447   CHOL 312 (H) 06/19/2015 0924   TRIG 261.0 (H) 12/16/2021 1352   TRIG 657 (H) 06/19/2015 0924   HDL 58.50 12/16/2021 1352   HDL 71 11/14/2018 1447   HDL 44 06/19/2015 0924   CHOLHDL 3 12/16/2021 1352   VLDL 52.2 (H) 12/16/2021 1352   [place] 44 04/09/2021 1506   [place] 66 11/14/2018 1447   [place] Comment 06/19/2015 0924   LDLDIRECT 74.0 12/16/2021 1352       Latest Ref Rng & Units 07/17/2022    5:03 PM 02/22/2022    8:02 PM 02/12/2022   10:33 AM  Hepatic Function  Total Protein 6.5 - 8.1 g/dL 7.2  7.6  7.0   Albumin 3.5 - 5.0 g/dL 4.9  5.2  4.9   AST 15 - 41 U/L 14  16  16    ALT 0 - 44 U/L 15  20  18    Alk Phosphatase 38 - 126 U/L 21  34  37   Total Bilirubin 0.3 - 1.2 mg/dL 0.5  0.7  0.7   Bilirubin, Direct 0.0 - 0.3 mg/dL   0.1     Lab Results  Component Value Date/Time   TSH 1.46 05/04/2022 10:09 AM   TSH 1.953 02/23/2022 12:12 AM   TSH 1.16 02/12/2022 10:33 AM   FREET4 0.68 07/05/2020 12:00 PM   FREET4 0.58 (L) 02/23/2013 04:29 PM       Latest Ref Rng & Units 07/17/2022    5:03 PM 05/04/2022  10:09 AM 02/22/2022    8:02 PM  CBC  WBC 4.0 - 10.5 K/uL 3.7  2.9  4.7   Hemoglobin 12.0 - 15.0 g/dL 11.4  13.4  13.2   Hematocrit 36.0 - 46.0 % 33.8  39.3  38.2   Platelets 150 - 400 K/uL 202  222.0  263     Lab Results  Component Value Date/Time   VD25OH 18.99 (L) 02/12/2022 10:33 AM   VD25OH 20.18 (L) 04/09/2021 03:06 PM    Clinical ASCVD:  The 10-year ASCVD risk score (Arnett DK, et al., 2019) is: 8.3%   Values used to calculate the score:     Age: 79 years     Sex: Female     Is Non-Hispanic African American: No     Diabetic: Yes     Tobacco smoker: No     Systolic Blood Pressure: 859 mmHg     Is BP treated: Yes     HDL Cholesterol: 58.5 mg/dL     Total Cholesterol: 158 mg/dL    Social History   Tobacco Use  Smoking Status Never    Passive exposure: Never  Smokeless Tobacco Never   BP Readings from Last 3 Encounters:  07/20/22 112/66  07/17/22 132/76  07/16/22 116/60   Pulse Readings from Last 3 Encounters:  07/20/22 66  07/17/22 65  07/16/22 62   Wt Readings from Last 3 Encounters:  07/20/22 200 lb 3.2 oz (90.8 kg)  07/17/22 200 lb (90.7 kg)  07/16/22 200 lb (90.7 kg)    Assessment: Review of patient past medical history, allergies, medications, health status, including review of consultants reports, laboratory and other test data, was performed as part of comprehensive evaluation and provision of chronic care management services.   SDOH:  (Social Determinants of Health) assessments and interventions performed: No, done within year Financial Resource Strain: Low Risk  (06/30/2022)   Overall Financial Resource Strain (CARDIA)    Difficulty of Paying Living Expenses: Not hard at all    SDOH Interventions    Flowsheet Row Clinical Support from 06/30/2022 in [place] Management from 02/26/2022 in [place] Management from 02/18/2022 in [place] from 08/26/2020 in [place] Management from 07/25/2020 in [place] Management from 02/29/2020 in [place]  SDOH Interventions        Food Insecurity Interventions Intervention Not Indicated Intervention Not Indicated Intervention Not Indicated -- Other (Comment) Other (Comment)  Housing Interventions Intervention Not Indicated Intervention Not Indicated Intervention Not Indicated -- -- --  Transportation Interventions Intervention Not Indicated Intervention Not Indicated Intervention Not Indicated -- -- Other (Comment)  Depression Interventions/Treatment   -- Medication, Counseling, Currently on Treatment -- Currently on Treatment  [pt is currently seeing a psychiatrist & a counselor] -- --  Financial Strain Interventions Intervention Not Indicated Intervention Not Indicated Intervention Not Indicated -- -- --  Physical Activity Interventions -- Intervention Not Indicated Other (Comments)  [reviewed [employer] benefit] -- -- --  Stress Interventions Intervention Not Indicated Offered [employer], Provide Counseling, Other (Comment)  [Well-established with psychiatrist and therapist] Other (Comment)  [referred to LCSW] -- -- --  Social Connections Interventions Intervention Not Indicated Intervention Not Indicated -- -- -- --       CCM Care Plan  Allergies  Allergen Reactions   Nitrofurantoin Monohyd Macro Nausea Only  Severe headache   Statins Other (See Comments)    Severe weakness, pain   Alprazolam Anxiety and Other (See Comments)    Hyper    Amoxicillin Rash   Prednisone Anxiety   Sulfa Antibiotics Nausea And Vomiting    Medications Reviewed Today     Reviewed by Edythe Clarity, [facility] (Pharmacist) on 07/22/22 at 1034  Med List Status: <None>   Medication Order Taking? Sig Documenting Provider Last Dose Status Informant  Alirocumab (PRALUENT) 150 MG/ML SOAJ [id number] Yes INJECT 150MG INTO THE SKIN EVERY 14 DAYS Jettie Booze, MD Taking Active   ALPHAGAN P 0.1 % SOLN [id number] Yes Place 1 drop into both eyes every 8 (eight) hours.  [provider] Taking Active Self  azelastine (ASTELIN) 0.1 % nasal spray [id number] Yes PLACE 2 SPRAYS INTO EACH NOSTRIL TWO TIMES A DAY AS DIRECTED Midge Minium, MD Taking Active   blood glucose meter kit and supplies KIT [id number] Yes Dispense based on patient and insurance preference. Use up to four times daily as directed. Midge Minium, MD Taking Active   budesonide-formoterol [facility]) 160-4.5 MCG/ACT inhaler [id number] Yes INHALE TWO PUFFS BY  MOUTH EVERY MORNING AND INHALE TWO PUFFS BY MOUTH EVERY NIGHT AT BEDTIME Brand Males, MD Taking Active   busPIRone (BUSPAR) 7.5 MG tablet [id number] Yes Take 1 tablet (7.5 mg total) by mouth 2 (two) times daily. Midge Minium, MD Taking Active   Cholecalciferol (VITAMIN D3) 50 MCG (2000 UT) CAPS [id number] Yes Take 2,000 Units by mouth daily. [provider] Taking Active   Cholecalciferol 1.25 MG (50000 UT) TABS [id number] Yes Take 1 tablet by mouth once a week. Lesleigh Noe, MD Taking Active   CLARAVIS 20 MG capsule [id number] Yes Take 20 mg by mouth daily. [provider] Taking Active   clonazePAM (KLONOPIN) 2 MG tablet [id number] Yes 1 mg 2 (two) times daily.  [provider] Taking Active            Med Note Gar Ponto   Fri Dec 29, 2019  6:22 PM)    Cyanocobalamin (VITAMIN B-12 PO) [id number] Yes Take by mouth. [provider] Taking Active   empagliflozin (JARDIANCE) 10 MG TABS tablet [id number] Yes Take 1 tablet (10 mg total) by mouth daily before breakfast. Midge Minium, MD Taking Active   fenofibrate (TRICOR) 145 MG tablet [id number] Yes Take 1 tablet (145 mg total) by mouth daily. Midge Minium, MD Taking Active   fexofenadine [facility]) 180 MG tablet [id number] Yes Take 1 tablet (180 mg total) by mouth daily. Maximiano Coss, NP Taking Active   fluticasone [facility]) 50 MCG/ACT nasal spray [id number] Yes SPRAY TWO SPRAYS IN EACH NOSTRIL ONCE DAILY Midge Minium, MD Taking Active   gabapentin (NEURONTIN) 600 MG tablet [id number] Yes Take 1,200 mg by mouth 2 (two) times daily.  [provider] Taking Active Self  glucose blood test strip [id number] Yes Use as instructed Midge Minium, MD Taking Active   lamoTRIgine (LAMICTAL) 200 MG tablet [id number] Yes Take 200 mg by mouth 2 (two) times daily. [provider] Taking Active Self  Lancets Glory Rosebush DELICA PLUS [id number]) [place] [id number] Yes Use twice  daily to test blood sugars as directed Dx- uncontrolled DM II Midge Minium, MD Taking Active   losartan (COZAAR) 50 MG tablet [id number] Yes Take 1 tablet (50 mg total) by mouth daily. Midge Minium, MD Taking Active   LUMIGAN 0.01 % SOLN [id number] Yes Place  1 drop into both eyes at bedtime.  [provider] Taking Active   meloxicam (MOBIC) 15 MG tablet [id number] Yes Take 1 tablet (15 mg total) by mouth daily. Maximiano Coss, NP Taking Active   metoprolol succinate (TOPROL-XL) 50 MG 24 hr tablet [id number] Yes TAKE ONE TABLET BY MOUTH DAILY WITH A MEAL Midge Minium, MD Taking Active   miconazole (MONISTAT 7) 2 % vaginal cream [id number] Yes Apply to red, burning areas twice daily as needed Midge Minium, MD Taking Active   montelukast (SINGULAIR) 10 MG tablet [id number] Yes Take 1 tablet (10 mg total) by mouth at bedtime. Midge Minium, MD Taking Active   Multiple Vitamin (MULTIVITAMIN WITH MINERALS) TABS tablet [id number] Yes Take 1 tablet by mouth daily. [provider] Taking Active   OLANZapine (ZYPREXA) 5 MG tablet [id number] Yes Take 5 mg by mouth daily as needed. [provider] Taking Active   omega-3 acid ethyl esters (LOVAZA) 1 g capsule [id number] Yes TAKE TWO CAPSULES BY MOUTH TWICE A DAY Jettie Booze, MD Taking Active   OVER THE COUNTER MEDICATION [id number] Yes Senna tablets- 8.6 mg daily [provider] Taking Active   Pyridoxine HCl (VITAMIN B-6 PO) [id number] Yes Take 1 tablet by mouth daily. [provider] Taking Active   QUEtiapine (SEROQUEL XR) 300 MG 24 hr tablet [id number] Yes Take 2 tablets (600 mg total) by mouth at bedtime.  Patient taking differently: Take 600 mg by mouth at bedtime. Taking 1 400 mg tablet at bedtime, takes 50 mg daily as needed   Donnal Moat T, PA-C Taking Active   QUEtiapine (SEROQUEL) 50 MG tablet [id number] Yes Take 50 mg by mouth daily. [provider] Taking Active    timolol (TIMOPTIC) 0.5 % ophthalmic solution [id number] Yes SMARTSIG:In Eye(s) [provider] Taking Active   UNABLE TO FIND [id number] Yes CPAP [provider] Taking Active   venlafaxine (EFFEXOR-XR) 75 MG 24 hr capsule [id number] Yes Take 75 mg by mouth. Take 2 tabs in the am and 1 tab in afternoonn [provider] Taking Active Self            Patient Active Problem List   Diagnosis Date Noted   Anxiety 07/20/2022   Hx of adenomatous colonic polyps 07/2021   Sleep related hypoxia 12/17/2020   Cervical myelopathy ([place]) 10/16/2020   Elevated blood-pressure reading, without diagnosis of hypertension 10/16/2020   Lumbar radiculopathy 10/16/2020   Positive ANA (antinuclear antibody) 08/14/2020   Chronically dry eyes, bilateral 08/14/2020   Dry mouth 08/14/2020   Allergic rhinitis due to pollen 12/13/2018   Diabetes mellitus without complication ([place]) 97/67/3419   Statin intolerance 11/04/2017   Family history of early CAD 03/10/2017   HTN (hypertension) 02/15/2017   Herniated nucleus pulposus, L5-S1 06/22/2014   Asthma, moderate persistent 10/09/2013   Obesity (BMI 30-39.9) 10/09/2013   Fatigue 12/12/2012   GERD (gastroesophageal reflux disease) 09/02/2012   Physical exam 07/03/2011   Vitamin D deficiency 09/15/2010   DEPRESSIVE DISORDER 07/17/2010   RHINITIS 06/14/2009   MUSCLE WEAKNESS (GENERALIZED) 01/03/2009   Hyperlipidemia 10/10/2008   BIPOLAR AFFECTIVE DISORDER 10/10/2008   ADD 10/10/2008    Immunization History  Administered Date(s) Administered   Influenza Split 09/02/2012   Influenza,inj,Quad PF,6+ Mos 10/09/2013, 08/13/2015, 08/17/2016, 07/28/2017, 08/03/2018, 08/08/2019, 07/20/2020, 08/05/2021, 07/16/2022   Moderna Sars-Covid-2 Vaccination 02/27/2019, 01/27/2020, 09/11/2020, 04/13/2021   Pneumococcal Polysaccharide-23 09/02/2012   Tdap 07/15/2012   Zoster Recombinat (Shingrix) 11/10/2018, 06/09/2019    Conditions to  be  addressed/monitored: HLD HTN DMII GERD  Care Plan : CCM Pharmacy Care Plan  Updates made by Edythe Clarity, RPH since 07/22/2022 12:00 AM     Problem: HLD HTN DMII GERD      Long-Range Goal: Disease Management   Start Date: 01/10/2021  Expected End Date: 01/10/2022  Recent Progress: On track  Priority: High  Note:    Current Barriers:  Praluent copay  Pharmacist Clinical Goal(s):  Patient will verbalize ability to afford treatment regimen through collaboration with PharmD and provider.   Interventions: 1:1 collaboration with Midge Minium, MD regarding development and update of comprehensive plan of care as evidenced by provider attestation and co-signature Inter-disciplinary care team collaboration (see longitudinal plan of care) Comprehensive medication review performed; medication list updated in electronic medical record  Hyperlipidemia: (LDL goal < 70) 07/22/22 -Controlled -Current treatment: Praluent 150 150 mg injection Appropriate, Effective, Safe, Accessible Fenofibrate 145 mg once daily Appropriate, Effective, Safe, Accessible Lovaza 1g 2 capsules po BID Appropriate, Effective, Safe, Accessible -Medications previously tried: statin intolerant  -Current dietary patterns:working on lifestyle mods, less sweets -Current exercise habits: also as made plans to exercise, will consider getting back to the gym. Had tried to go before covid, will likely start back up. -Educated on Cholesterol goals;  Reports that her Praluent is no longer free, checked [employer] and she is approved for a grant through February 2024.  Will contact the pharmacy to see if they need billing information.  She has not missed any doses but would not be able to afford next refill. [employer] grant should cover this and Lovaza. Will contact pharmacy for coordination.  Diabetes (A1c goal <7%) 07/22/22 -Controlled, most A1c 6.6% -Current medications: Jardiance 35m Appropriate, Query effective,  ,  -Current home glucose readings fasting glucose: 100-135 is her normal fasting readings -Educated on A1c and blood sugar goals; -Note dietary/exercise changes in HLD -Counseled on diet and exercise extensively -She is tolerating Jardiance much better than previous medications.  She has seen improvement in A1c and home glucose readings. She has made great strides in lifestyle mods, encouraged her to continue.  Patient Goals/Self-Care Activities Patient will:  - take medications as prescribed target a minimum of 150 minutes of moderate intensity exercise weekly  Follow Up Plan: will contact pharmacy to ensure [employer] grant coordination of benefits is applied again.       Compliance/Adherence/Medication fill history: Care Gaps: None  Star-Rating Drugs:  Losartan 549m8/10/23 30ds  Patient's [facility] pharmacy is:  [facility] [id number] GRLady GaryNCC[place]703[place]hone: [phone]: [phone] Up:  Patient agrees to Care Plan and Follow-up.  Future Appointments  Date Time Provider [place]11/11/2021  2:30 PM MiWard GivensNP GNA-GNA None  09/11/2022  9:00 AM JoClydell HakimRD NDFairmont CityDM  10/14/2022 10:20 AM TaMidge MiniumMD [facility]-SV PERupertPharmD Clinical Pharmacist  [phone]

## 2022-07-31 ENCOUNTER — Other Ambulatory Visit: Payer: Self-pay | Admitting: Student

## 2022-07-31 DIAGNOSIS — M5416 Radiculopathy, lumbar region: Secondary | ICD-10-CM

## 2022-08-10 ENCOUNTER — Other Ambulatory Visit: Payer: Self-pay

## 2022-08-10 ENCOUNTER — Telehealth: Payer: Self-pay | Admitting: Pharmacist

## 2022-08-10 DIAGNOSIS — E782 Mixed hyperlipidemia: Secondary | ICD-10-CM

## 2022-08-10 MED ORDER — FENOFIBRATE 145 MG PO TABS: 145.0000 mg | ORAL_TABLET | Freq: Every day | ORAL | 1 refills | Status: DC

## 2022-08-10 NOTE — Progress Notes (Signed)
Chronic Care Management Pharmacy Assistant   Name: [name]  MRN: [id number] DOB: 1956-09-15   Reason for Encounter: Disease State - Diabetes Call     Recent office visits:  None noted  Recent consult visits:  None noted   Hospital visits: 07/17/22 Medication Reconciliation was completed by comparing discharge summary, patient's EMR and Pharmacy list, and upon discussion with patient.  Admitted to the hospital on 07/17/22 due to Hypotension. Discharge date was 07/17/22. Discharged from [facility] in [place].    New?Medications Started at [facility] Discharge:?? None noted.   Medication Changes at Hospital Discharge: None noted.  Medications Discontinued at Hospital Discharge: None noted.   Medications that remain the same after Hospital Discharge:??  All other medications will remain the same.    Hospital visits: 02/22/22 - 02/24/22 Medication Reconciliation was completed by comparing discharge summary, patient's EMR and Pharmacy list, and upon discussion with patient.  Admitted to the hospital on 02/24/22 due to Hypertension. Discharge date was 07/17/22. Discharged from [facility] in [place].    New?Medications Started at [facility] Discharge:?? lisinopril (ZESTRIL) 20 MG tablet  Medication Changes at Hospital Discharge: None noted.  Medications Discontinued at Hospital Discharge: None noted.   Medications that remain the same after Hospital Discharge:??  All other medications will remain the same.    Medications: Outpatient Encounter Medications as of 08/10/2022  Medication Sig   Alirocumab (PRALUENT) 150 MG/ML SOAJ INJECT 150MG INTO THE SKIN EVERY 14 DAYS   ALPHAGAN P 0.1 % SOLN Place 1 drop into both eyes every 8 (eight) hours.    azelastine (ASTELIN) 0.1 % nasal spray PLACE 2 SPRAYS INTO EACH NOSTRIL TWO TIMES A DAY AS DIRECTED   blood glucose meter kit and supplies KIT Dispense based on patient and insurance preference. Use up to four  times daily as directed.   budesonide-formoterol (SYMBICORT) 160-4.5 MCG/ACT inhaler INHALE TWO PUFFS BY MOUTH EVERY MORNING AND INHALE TWO PUFFS BY MOUTH EVERY NIGHT AT BEDTIME   busPIRone (BUSPAR) 7.5 MG tablet Take 1 tablet (7.5 mg total) by mouth 2 (two) times daily.   Cholecalciferol (VITAMIN D3) 50 MCG (2000 UT) CAPS Take 2,000 Units by mouth daily.   Cholecalciferol 1.25 MG (50000 UT) TABS Take 1 tablet by mouth once a week.   CLARAVIS 20 MG capsule Take 20 mg by mouth daily.   clonazePAM (KLONOPIN) 2 MG tablet 1 mg 2 (two) times daily.    Cyanocobalamin (VITAMIN B-12 PO) Take by mouth.   empagliflozin (JARDIANCE) 10 MG TABS tablet Take 1 tablet (10 mg total) by mouth daily before breakfast.   fenofibrate (TRICOR) 145 MG tablet Take 1 tablet (145 mg total) by mouth daily.   fexofenadine (ALLEGRA) 180 MG tablet Take 1 tablet (180 mg total) by mouth daily.   fluticasone (FLONASE) 50 MCG/ACT nasal spray SPRAY TWO SPRAYS IN EACH NOSTRIL ONCE DAILY   gabapentin (NEURONTIN) 600 MG tablet Take 1,200 mg by mouth 2 (two) times daily.    glucose blood test strip Use as instructed   lamoTRIgine (LAMICTAL) 200 MG tablet Take 200 mg by mouth 2 (two) times daily.   Lancets (ONETOUCH DELICA PLUS [id number]) MISC Use twice daily to test blood sugars as directed Dx- uncontrolled DM II   losartan (COZAAR) 50 MG tablet Take 1 tablet (50 mg total) by mouth daily.   LUMIGAN 0.01 % SOLN Place 1 drop into both eyes at bedtime.    meloxicam (MOBIC) 15 MG tablet Take 1 tablet (15 mg  total) by mouth daily.   metoprolol succinate (TOPROL-XL) 50 MG 24 hr tablet TAKE ONE TABLET BY MOUTH DAILY WITH A MEAL   miconazole (MONISTAT 7) 2 % vaginal cream Apply to red, burning areas twice daily as needed   montelukast (SINGULAIR) 10 MG tablet Take 1 tablet (10 mg total) by mouth at bedtime.   Multiple Vitamin (MULTIVITAMIN WITH MINERALS) TABS tablet Take 1 tablet by mouth daily.   OLANZapine (ZYPREXA) 5 MG tablet Take 5 mg  by mouth daily as needed.   omega-3 acid ethyl esters (LOVAZA) 1 g capsule TAKE TWO CAPSULES BY MOUTH TWICE A DAY   OVER THE COUNTER MEDICATION Senna tablets- 8.6 mg daily   Pyridoxine HCl (VITAMIN B-6 PO) Take 1 tablet by mouth daily.   QUEtiapine (SEROQUEL XR) 300 MG 24 hr tablet Take 2 tablets (600 mg total) by mouth at bedtime. (Patient taking differently: Take 600 mg by mouth at bedtime. Taking 1 400 mg tablet at bedtime, takes 50 mg daily as needed)   QUEtiapine (SEROQUEL) 50 MG tablet Take 50 mg by mouth daily.   timolol (TIMOPTIC) 0.5 % ophthalmic solution SMARTSIG:In Eye(s)   UNABLE TO FIND CPAP   venlafaxine (EFFEXOR-XR) 75 MG 24 hr capsule Take 75 mg by mouth. Take 2 tabs in the am and 1 tab in afternoonn   No facility-administered encounter medications on file as of 08/10/2022.    Current antihyperglycemic regimen:  Jardiance 85m   What recent interventions/DTPs have been made to improve glycemic control:  Patient denied any recent changes in medications since last visit with CPP   Have there been any recent hospitalizations or ED visits since last visit with CPP?  Patient had an ED visit on 02/22/22 and 07/17/22  Patient denies hypoglycemic symptoms, including Pale, Sweaty, Shaky, Hungry, Nervous/irritable, and Vision changes   Patient denies hyperglycemic symptoms, including blurry vision, excessive thirst, fatigue, polyuria, and weakness   How often are you checking your blood sugar? Patient reported checking blood sugars daily   What are your blood sugars ranging?  Fasting: 110 Before meals:  After meals:  Bedtime:   During the week, how often does your blood glucose drop below 70?  Patient reported no readings 70 or below. 90s are the lowest she has had.   Are you checking your feet daily/regularly? Patient reported checking feet regularly and denied having any current concerns currently.    Adherence Review: Is the patient currently on a STATIN medication?  No Is the patient currently on ACE/ARB medication? Yes Does the patient have >5 day gap between last estimated fill dates? No   Care Gaps   AWV: overdue Colonoscopy: done 07/16/21 DM Eye Exam: due 09/03/22 DM Foot Exam: due 05/05/23 Microalbumin: done 03/07/20 HbgAIC: 06/25/22 (6.6) DEXA: done 07/11/15 Mammogram:  done 04/28/21     Star Rating Drugs: losartan (COZAAR) 50 MG tablet - last filled 06/18/22 30 days  empagliflozin (JARDIANCE) 10 MG TABS tablet - last filled 06/26/22 30 days   Future Appointments  Date Time Provider DGrant-[place] 08/21/2022  5:20 PM GI-315 MR 1 [id number] GI-315 W. WE  09/09/2022  2:30 PM MWard Givens NP GNA-GNA None  09/11/2022  9:00 AM JClydell Hakim RD NHavilandNDM  10/14/2022 10:20 AM TMidge Minium MD [facility] PEC  01/27/2023  2:00 PM [facility] CCM PHARMACIST [facility] PIngleside on the Bay CMonte RioClinical Pharmacist Assistant  [phone]

## 2022-08-19 ENCOUNTER — Encounter: Payer: Self-pay | Admitting: Family Medicine

## 2022-08-19 ENCOUNTER — Ambulatory Visit (INDEPENDENT_AMBULATORY_CARE_PROVIDER_SITE_OTHER): Payer: Medicare Other | Admitting: Family Medicine

## 2022-08-19 VITALS — BP 130/84 | HR 67 | Temp 98.6°F | Resp 16 | Ht 69.0 in | Wt 197.5 lb

## 2022-08-19 DIAGNOSIS — R5381 Other malaise: Secondary | ICD-10-CM

## 2022-08-19 DIAGNOSIS — R5383 Other fatigue: Secondary | ICD-10-CM

## 2022-08-19 DIAGNOSIS — R519 Headache, unspecified: Secondary | ICD-10-CM | POA: Diagnosis not present

## 2022-08-19 LAB — POC COVID19 BINAXNOW: SARS Coronavirus 2 Ag: NEGATIVE

## 2022-08-19 NOTE — Patient Instructions (Signed)
Thankfully your COVID test was negative! I think your symptoms are either a little virus you picked up or stress related Your sugars are FANTASTIC! Call with any questions or concerns Keep up the good work! HAPPY BELATED BIRTHDAY!!

## 2022-08-19 NOTE — Progress Notes (Signed)
   Subjective:    Patient ID: [name], female    DOB: [birth year], 66 y.o.   MRN: [id number]  HPI Nausea- pt reports sxs started w/ nausea 'a couple days ago'.  Then noticed she was having leg pain while walking- described as a 'tiredness'.  Denies body aches.  No fevers or chills.  + HAs- frontal.  Denies sore throat, congestion.  'it could be nerves'.  No known sick contacts.  Sugars running 86-160.   Review of Systems For ROS see HPI     Objective:   Physical Exam Vitals reviewed.  Constitutional:      General: She is not in acute distress.    Appearance: Normal appearance. She is well-developed. She is not ill-appearing.  HENT:     Head: Normocephalic and atraumatic.     Right Ear: Tympanic membrane normal.     Left Ear: Tympanic membrane normal.     Nose: No congestion.     Right Sinus: No maxillary sinus tenderness or frontal sinus tenderness.     Left Sinus: No maxillary sinus tenderness or frontal sinus tenderness.     Mouth/Throat:     Mouth: Mucous membranes are normal.     Pharynx: Uvula midline. No oropharyngeal exudate or posterior oropharyngeal erythema.  Eyes:     Extraocular Movements: EOM normal.     Conjunctiva/sclera: Conjunctivae normal.     Pupils: Pupils are equal, round, and reactive to light.  Cardiovascular:     Rate and Rhythm: Normal rate and regular rhythm.     Heart sounds: Normal heart sounds.  Pulmonary:     Effort: Pulmonary effort is normal. No respiratory distress.     Breath sounds: Normal breath sounds. No wheezing.  Abdominal:     General: There is no distension.     Palpations: Abdomen is soft.     Tenderness: There is no abdominal tenderness. There is no guarding or rebound.  Musculoskeletal:     Cervical back: Normal range of motion and neck supple.  Lymphadenopathy:     Cervical: No cervical adenopathy.  Skin:    General: Skin is warm and dry.  Neurological:     General: No focal deficit present.     Mental Status: She is  alert and oriented to person, place, and time.     Cranial Nerves: No cranial nerve deficit.     Motor: No weakness.     Gait: Gait normal.  Psychiatric:     Comments: anxious           Assessment & Plan:  Malaise/Fatigue- new.  Pt reports she has had nausea, fatigue, HA, and some leg weakness.  Denies fevers, chills, body aches, sore throat, or congestion.  Thankfully COVID test was negative.  Reassured her that her sxs are not sugar related.  May be stress/anxiety.  Discussed that it may be another viral syndrome that isn't COVID and that we don't test for.  Encouraged her to rest, drink plenty of fluids, and let me know if sxs change or worsen.  Pt expressed understanding and is in agreement w/ plan.

## 2022-08-21 ENCOUNTER — Other Ambulatory Visit: Payer: Medicare Other

## 2022-08-25 ENCOUNTER — Telehealth: Payer: Self-pay | Admitting: Interventional Cardiology

## 2022-08-25 NOTE — Telephone Encounter (Signed)
Pt asking to speak with Elder Cyphers RPH-CPP, pt did not say what exactly its about

## 2022-08-26 MED ORDER — EMPAGLIFLOZIN 10 MG PO TABS: 10.0000 mg | ORAL_TABLET | Freq: Every day | ORAL | 0 refills | Status: DC

## 2022-08-26 NOTE — Telephone Encounter (Signed)
Patient concerned about high cost of Jardiance, wondering about patient assistance options.  Checked with pharmacy, she is in the coverage gap, cost is around $150.  Pt states she can afford one time to pay the higher cost.  Will give samples x 4 weeks and she can call back in December if she needs a week or two mor samples to get her to January.    Patient agreeable to plan

## 2022-09-01 ENCOUNTER — Other Ambulatory Visit: Payer: Self-pay

## 2022-09-01 DIAGNOSIS — B0229 Other postherpetic nervous system involvement: Secondary | ICD-10-CM

## 2022-09-01 MED ORDER — FEXOFENADINE HCL 180 MG PO TABS: 180.0000 mg | ORAL_TABLET | Freq: Every day | ORAL | 3 refills | Status: DC

## 2022-09-09 ENCOUNTER — Ambulatory Visit: Payer: Medicare HMO | Admitting: Adult Health

## 2022-09-11 ENCOUNTER — Encounter: Payer: Medicare Other | Attending: Family Medicine | Admitting: Dietician

## 2022-09-11 ENCOUNTER — Encounter: Payer: Self-pay | Admitting: Dietician

## 2022-09-11 VITALS — Wt 194.0 lb

## 2022-09-11 DIAGNOSIS — E119 Type 2 diabetes mellitus without complications: Secondary | ICD-10-CM | POA: Insufficient documentation

## 2022-09-11 NOTE — Progress Notes (Signed)
Diabetes Self-Management Education  Visit Type: Follow-up  Appt. Start Time: 0910 Appt. End Time: 0945  09/11/2022  [name], identified by name and date of birth, is a 66 y.o. female with a diagnosis of Diabetes:  .   ASSESSMENT Patient is here today alone.  She was last seen by this RD on 06/08/2022  She states that she was very stressed with pet sitting. She is having increased anxiety when she wakes. Patient is here today alone.  She was last seen by this RD on 06/08/2022  Fasting blood glucose 129-159 and wakes with headaches. She is continuing to lose weight and is doing well with her eating habits. Could increase her vegetable intake. She is not as active currently.  History includes:  Type 2 Diabetes, HTN, HLD, GERD, OSA on c-pap, constipation, GERD, bipolar. A1C 6.6% 06/25/2022 decreased from 8.2% 04/14/2022 decreased, eGFR 74 (05/04/2022), GFR 92, cholesterol 148, HDL 66, LDL 44, Triglycerides 189, vitamin D 20 on 04/09/21 Medications includes vitamin D, vitamin B-12, Jardiance   Weight hx: 194 lbs 09/11/2022 201 lbs 06/08/2022 218 lbs 04/14/2022 219 lbs 06/23/2021 220 lbs 04/21/2021  222 lbs 04/07/2021 231 lbs January 2019 193 lbs 06/2018 207 lbs 11/22/2018   Patient lives with her dog.  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  She worked for [employer] prior to being downsized. She struggles with finances at times. She struggles with standing for long periods of time.   She has gone to a therapist but has not gone for a while.  She states that it helps when she talks with her cousin.  Being alone a lot causes her stress.  She states that she needs more structure. She enjoys playing guitar and singing.  She writes her own songs.  Last menstrual period 11/09/2000. There is no height or weight on file to calculate BMI.   Diabetes Self-Management Education - 09/11/22 1835       Visit Information   Visit Type Follow-up      Psychosocial Assessment   Patient  Belief/Attitude about Diabetes Motivated to manage diabetes    What is the hardest part about your diabetes right now, causing you the most concern, or is the most worrisome to you about your diabetes?   Getting support / problem solving    Self-care barriers Other (comment)   stress/bipolar   Self-management support Doctor's office    Other persons present Patient    Patient Concerns Nutrition/Meal planning    Special Needs None    Preferred Learning Style No preference indicated    Learning Readiness Ready    How often do you need to have someone help you when you read instructions, pamphlets, or other written materials from your doctor or pharmacy? 1 - Never      Pre-Education Assessment   Patient understands the diabetes disease and treatment process. Comprehends key points    Patient understands incorporating nutritional management into lifestyle. Comprehends key points    Patient undertands incorporating physical activity into lifestyle. Comprehends key points    Patient understands using medications safely. Comprehends key points    Patient understands monitoring blood glucose, interpreting and using results Comprehends key points    Patient understands prevention, detection, and treatment of acute complications. Comprehends key points    Patient understands prevention, detection, and treatment of chronic complications. Compreheands key points    Patient understands how to develop strategies to address psychosocial issues. Needs Review    Patient understands  how to develop strategies to promote health/change behavior. Needs Review      Complications   Last HgB A1C per patient/outside source 6.6 %   06/25/2022   How often do you check your blood sugar? 1-2 times/day    Fasting Blood glucose range (mg/dL) 70-129;130-179    Number of hypoglycemic episodes per month 0    Number of hyperglycemic episodes ( >276m/dL): Weekly      Dietary Intake   Breakfast steel cut oats, 3/4 cup  blueberries    Lunch tKu[place]and cheese sandwich on keto bread    Snack (afternoon) apple    Dinner boca burger without a bun, broccoli, starch    Beverage(s) water, sparkling water, occasional coffee with almond milk      Activity / Exercise   Activity / Exercise Type Light (walking / raking leaves)      Patient Education   Previous Diabetes Education Yes (please comment)   05/2022   Healthy Eating Meal options for control of blood glucose level and chronic complications.    Being Active Role of exercise on diabetes management, blood pressure control and cardiac health.    Medications Reviewed patients medication for diabetes, action, purpose, timing of dose and side effects.    Monitoring Taught/discussed recording of test results and interpretation of SMBG.    Lifestyle and Health Coping Lifestyle issues that need to be addressed for better diabetes care      Individualized Goals (developed by patient)   Nutrition General guidelines for healthy choices and portions discussed    Physical Activity Exercise 5-7 days per week;30 minutes per day    Medications take my medication as prescribed    Monitoring  Test my blood glucose as discussed    Problem Solving Eating Pattern;Addressing barriers to behavior change    Reducing Risk examine blood glucose patterns    Health Coping Ask for help with psychological, social, or emotional issues      Patient Self-Evaluation of Goals - Patient rates self as meeting previously set goals (% of time)   Nutrition >75% (most of the time)    Physical Activity 50 - 75 % (half of the time)    Medications >75% (most of the time)    Monitoring >75% (most of the time)    Problem Solving and behavior change strategies  >75% (most of the time)    Reducing Risk (treating acute and chronic complications) >>93%(most of the time)    Health Coping >75% (most of the time)      Post-Education Assessment   Patient understands the diabetes disease and treatment  process. Demonstrates understanding / competency    Patient understands incorporating nutritional management into lifestyle. Demonstrates understanding / competency    Patient undertands incorporating physical activity into lifestyle. Demonstrates understanding / competency    Patient understands using medications safely. Demonstrates understanding / competency    Patient understands monitoring blood glucose, interpreting and using results Demonstrates understanding / competency    Patient understands prevention, detection, and treatment of acute complications. Demonstrates understanding / competency    Patient understands prevention, detection, and treatment of chronic complications. Demonstrates understanding / competency    Patient understands how to develop strategies to address psychosocial issues. Comprehends key points    Patient understands how to develop strategies to promote health/change behavior. Comprehends key points      Outcomes   Expected Outcomes Demonstrated interest in learning. Expect positive outcomes    Future DMSE 3-4 months  Program Status Not Completed      Subsequent Visit   Since your last visit have you continued or begun to take your medications as prescribed? Yes    Since your last visit have you experienced any weight changes? Loss    Weight Loss (lbs) 7    Since your last visit, are you checking your blood glucose at least once a day? Yes             Individualized Plan for Diabetes Self-Management Training:   Learning Objective:  Patient will have a greater understanding of diabetes self-management. Patient education plan is to attend individual and/or group sessions per assessed needs and concerns.   Plan:   Patient Instructions   Patient Instructions  Walk as you are able and stretch daily.  Listen to your body.   Continue with the changes that you have made! "Lifestyle change" Do something everyday that brings your joy Deep breathing  exercise/belly breathing    Find ways to increase your non-starchy vegetables  Add greens to your sandwich and raw veges on the side  Veges and hummus when hungry between meals      Expected Outcomes:  Demonstrated interest in learning. Expect positive outcomes  Education material provided:   If problems or questions, patient to contact team via:  Phone  Future DSME appointment: 3-4 months

## 2022-09-11 NOTE — Patient Instructions (Addendum)
Patient Instructions  Walk as you are able and stretch daily.  Listen to your body.   Continue with the changes that you have made! "Lifestyle change" Do something everyday that brings your joy Deep breathing exercise/belly breathing    Find ways to increase your non-starchy vegetables  Add greens to your sandwich and raw veges on the side  Veges and hummus when hungry between meals

## 2022-09-17 ENCOUNTER — Ambulatory Visit: Payer: Medicare Other | Admitting: Family Medicine

## 2022-09-17 ENCOUNTER — Encounter: Payer: Self-pay | Admitting: Family Medicine

## 2022-09-17 VITALS — BP 102/62 | HR 68 | Temp 99.7°F | Resp 19 | Ht 69.0 in | Wt 194.5 lb

## 2022-09-17 DIAGNOSIS — G43809 Other migraine, not intractable, without status migrainosus: Secondary | ICD-10-CM | POA: Diagnosis not present

## 2022-09-17 MED ORDER — KETOROLAC TROMETHAMINE 60 MG/2ML IM SOLN: 60.0000 mg | Freq: Once | INTRAMUSCULAR | Status: DC

## 2022-09-17 MED ORDER — SUMATRIPTAN SUCCINATE 50 MG PO TABS: 50.0000 mg | ORAL_TABLET | Freq: Once | ORAL | 1 refills | Status: DC

## 2022-09-17 MED ORDER — KETOROLAC TROMETHAMINE 60 MG/2ML IM SOLN: 60.0000 mg | Freq: Once | INTRAMUSCULAR | Status: AC

## 2022-09-17 NOTE — Patient Instructions (Signed)
Follow up by phone or MyChart tomorrow and let me know how you're feeling TAKE the Sumatriptan (rescue medication) today and lie down to try and break this headache cycle Drink LOTS of water to help w/ headaches At onset of headache, take 2 Excedrin Migraine (store brand is just as good).  If no relief in 20-30 minutes, take the Sumatriptan (rescue medication) REST! Call with any questions or concerns Stay Safe!  Stay Healthy! Hang in there!!!

## 2022-09-17 NOTE — Progress Notes (Signed)
   Subjective:    Patient ID: [name], female    DOB: 06-Aug-1956, 66 y.o.   MRN: [id number]  HPI HA- 'really bad headaches.  They feel like migraines'.  HA's will move from temples to front to top of the head.  No relief w/ tylenol or ibuprofen.  Denies sensitivity to light or sound.  No nausea.  Not having pain in neck.  Not having pain in back of head.  Pt doesn't feel sxs are sinus related but does report pain behind eyes.  Denies blurry or double vision.  No pain with chewing.  Occasional mild discomfort of R ear.  Taking Allegra daily and using nasal sprays.  Sxs started ~3 weeks ago.  Pain is worse w/ coughing.  'it's just a nuisance'.     Review of Systems For ROS see HPI     Objective:   Physical Exam Vitals reviewed.  Constitutional:      General: She is not in acute distress.    Appearance: Normal appearance. She is not ill-appearing.  HENT:     Head: Normocephalic and atraumatic.     Right Ear: Tympanic membrane and ear canal normal.     Left Ear: Tympanic membrane and ear canal normal.     Nose: No congestion.     Comments: No TTP over frontal or maxillary sinuses Eyes:     Extraocular Movements: Extraocular movements intact.     Conjunctiva/sclera: Conjunctivae normal.     Pupils: Pupils are equal, round, and reactive to light.  Cardiovascular:     Pulses: Normal pulses.     Comments: No TTP over either temporal artery Musculoskeletal:     Cervical back: Normal range of motion and neck supple. Tenderness (mild TTP over traps bilaterally) present. No rigidity.  Lymphadenopathy:     Cervical: No cervical adenopathy.  Skin:    General: Skin is warm and dry.     Findings: No rash.  Neurological:     General: No focal deficit present.     Mental Status: She is alert and oriented to person, place, and time.     Cranial Nerves: No cranial nerve deficit.     Motor: No weakness.     Coordination: Coordination normal.     Gait: Gait normal.  Psychiatric:         Mood and Affect: Mood normal.        Behavior: Behavior normal.        Thought Content: Thought content normal.           Assessment & Plan:   HA- new.  This is not consistent w/ tension HA or temporal arteritis (no TTP, no visual changes, no pain w/ chewing).  She has hx of sinus inflammation but feels that this is different.  Hx of migraines in the past but doesn't have the photophobia or phonophobia that typically accompanies a migraine.  She admits to high levels of stress and anxiety recently and allowing herself to get very 'run down'.  Will treat w/ Toradol '60mg'$  here in office and provide migraine rescue medication in case this is an atypical intractable migraine.  Reviewed supportive care and red flags that should prompt return.  Pt expressed understanding and is in agreement w/ plan.

## 2022-09-19 ENCOUNTER — Other Ambulatory Visit: Payer: Medicare Other

## 2022-09-23 ENCOUNTER — Telehealth: Payer: Self-pay | Admitting: Family Medicine

## 2022-09-23 MED ORDER — ONETOUCH DELICA PLUS LANCET30G MISC: 1 refills | Status: DC

## 2022-09-23 NOTE — Telephone Encounter (Signed)
Encourage patient to contact the pharmacy for refills or they can request refills through [facility]  (Please schedule appointment if patient has not been seen in over a year)    WHAT PHARMACY WOULD THEY LIKE THIS SENT TO: [facility] [phone] - [place], [place]: [place]   NOTES/COMMENTS FROM PATIENT: Pt states that she checks her blood sugar 3 to 4 times a days. Pt is getting very low on stock. Pt needs a refill.       [place] office please notify patient: It takes 48-72 hours to process rx refill requests Ask patient to call pharmacy to ensure rx is ready before heading there.

## 2022-09-23 NOTE — Telephone Encounter (Signed)
Ordered, called patient to inform she was greatful

## 2022-10-09 ENCOUNTER — Telehealth: Payer: Self-pay | Admitting: Family Medicine

## 2022-10-09 NOTE — Telephone Encounter (Signed)
77 is thankfully not dangerous.  She should make sure she is eating regularly.  If she continues to have leg weakness or has severe headache, needs to be evaluated at [facility] or ER

## 2022-10-09 NOTE — Telephone Encounter (Signed)
Patient was called and advised agreed with the plan

## 2022-10-09 NOTE — Telephone Encounter (Signed)
Caller name: YOANNA JURCZYK  On DPR?: Yes  Call back number: [phone] (mobile)  Provider they see: Midge Minium, MD  Reason for call: pt called stating that her sugar has been dropping. Pt states that the lowest her sugar has been 77. Pt also states she has been  having headaches and hot flashes.

## 2022-10-09 NOTE — Telephone Encounter (Signed)
Should I refer to ED as we do not have any available appt for today and she is symptomatic?

## 2022-10-09 NOTE — Telephone Encounter (Signed)
Pt called back stating that she had weakness in both legs. Weakness in her muscles also.

## 2022-10-12 ENCOUNTER — Other Ambulatory Visit: Payer: Medicare Other

## 2022-10-13 ENCOUNTER — Ambulatory Visit: Payer: Medicare Other | Admitting: Family Medicine

## 2022-10-13 ENCOUNTER — Encounter: Payer: Self-pay | Admitting: Family Medicine

## 2022-10-13 VITALS — BP 110/64 | HR 63 | Temp 97.1°F | Resp 18 | Ht 69.0 in | Wt 195.2 lb

## 2022-10-13 DIAGNOSIS — E782 Mixed hyperlipidemia: Secondary | ICD-10-CM

## 2022-10-13 DIAGNOSIS — I10 Essential (primary) hypertension: Secondary | ICD-10-CM | POA: Diagnosis not present

## 2022-10-13 DIAGNOSIS — Z23 Encounter for immunization: Secondary | ICD-10-CM

## 2022-10-13 DIAGNOSIS — E119 Type 2 diabetes mellitus without complications: Secondary | ICD-10-CM | POA: Diagnosis not present

## 2022-10-13 DIAGNOSIS — R5383 Other fatigue: Secondary | ICD-10-CM

## 2022-10-13 DIAGNOSIS — E559 Vitamin D deficiency, unspecified: Secondary | ICD-10-CM | POA: Diagnosis not present

## 2022-10-13 LAB — CBC WITH DIFFERENTIAL/PLATELET
Basophils Absolute: 0 10*3/uL (ref 0.0–0.1)
Basophils Relative: 0.3 % (ref 0.0–3.0)
Eosinophils Absolute: 0 10*3/uL (ref 0.0–0.7)
Eosinophils Relative: 0 % (ref 0.0–5.0)
HCT: 37.8 % (ref 36.0–46.0)
Hemoglobin: 12.8 g/dL (ref 12.0–15.0)
Lymphocytes Relative: 28.7 % (ref 12.0–46.0)
Lymphs Abs: 1.2 10*3/uL (ref 0.7–4.0)
MCHC: 34 g/dL (ref 30.0–36.0)
MCV: 91.2 fl (ref 78.0–100.0)
Monocytes Absolute: 0.3 10*3/uL (ref 0.1–1.0)
Monocytes Relative: 7.8 % (ref 3.0–12.0)
Neutro Abs: 2.7 10*3/uL (ref 1.4–7.7)
Neutrophils Relative %: 63.2 % (ref 43.0–77.0)
Platelets: 230 10*3/uL (ref 150.0–400.0)
RBC: 4.15 Mil/uL (ref 3.87–5.11)
RDW: 13.5 % (ref 11.5–15.5)
WBC: 4.2 10*3/uL (ref 4.0–10.5)

## 2022-10-13 LAB — VITAMIN D 25 HYDROXY (VIT D DEFICIENCY, FRACTURES): VITD: 30.97 ng/mL (ref 30.00–100.00)

## 2022-10-13 LAB — LIPID PANEL
Cholesterol: 125 mg/dL (ref 0–200)
HDL: 59.1 mg/dL (ref >39.00)
LDL Cholesterol: 48 mg/dL (ref 0–99)
NonHDL: 65.69
Total CHOL/HDL Ratio: 2
Triglycerides: 88 mg/dL (ref 0.0–149.0)
VLDL: 17.6 mg/dL (ref 0.0–40.0)

## 2022-10-13 LAB — BASIC METABOLIC PANEL
BUN: 17 mg/dL (ref 6–23)
CO2: 28 mEq/L (ref 19–32)
Calcium: 9.5 mg/dL (ref 8.4–10.5)
Chloride: 103 mEq/L (ref 96–112)
Creatinine, Ser: 0.72 mg/dL (ref 0.40–1.20)
GFR: 87.9 mL/min (ref >60.00)
Glucose, Bld: 108 mg/dL — ABNORMAL HIGH (ref 70–99)
Potassium: 3.8 mEq/L (ref 3.5–5.1)
Sodium: 140 mEq/L (ref 135–145)

## 2022-10-13 LAB — HEPATIC FUNCTION PANEL
ALT: 26 U/L (ref 0–35)
AST: 21 U/L (ref 0–37)
Albumin: 4.7 g/dL (ref 3.5–5.2)
Alkaline Phosphatase: 23 U/L — ABNORMAL LOW (ref 39–117)
Bilirubin, Direct: 0.1 mg/dL (ref 0.0–0.3)
Total Bilirubin: 0.4 mg/dL (ref 0.2–1.2)
Total Protein: 7 g/dL (ref 6.0–8.3)

## 2022-10-13 LAB — B12 AND FOLATE PANEL
Folate: >23.8 ng/mL (ref >5.9)
Vitamin B-12: 338 pg/mL (ref 211–911)

## 2022-10-13 LAB — HEMOGLOBIN A1C: Hgb A1c MFr Bld: 6 % (ref 4.6–6.5)

## 2022-10-13 LAB — TSH: TSH: 1.46 u[IU]/mL (ref 0.35–5.50)

## 2022-10-13 NOTE — Patient Instructions (Signed)
Follow up in 3-4 months to recheck sugar We'll notify you of your lab results and make any changes if needed Continue to work on daily exercise- you can do it! Blood pressure looks great! Call with any questions or concerns Stay Safe!  Stay Healthy! Happy Holidays!!!

## 2022-10-13 NOTE — Progress Notes (Signed)
   Subjective:    Patient ID: [name], female    DOB: 03/14/1956, 66 y.o.   MRN: [id number]  HPI DM- chronic problem, on Jardiance '10mg'$  daily.  UTD on foot exam, microalbumin, eye exam.  'i don't feel so well'.  Pt reports low energy.  Last A1C was 6.6%.  pt is eating a low carb diet.  Frustrated w/ her dx.  No numbness/tingling of hands/feet.   Hyperlipidemia- chronic problem, on Praluent '150mg'$  q14 days, Fenofibrate '145mg'$  daily, and Lovasa 2gm BID.  Denies abd pain, N/V.  HTN- chronic problem, on Losartan '50mg'$  daily, Metoprolol '50mg'$  daily.  No CP, SOB, Has, visual changes, edema.   Review of Systems For ROS see HPI     Objective:   Physical Exam Vitals reviewed.  Constitutional:      General: She is not in acute distress.    Appearance: Normal appearance. She is well-developed. She is not ill-appearing.  HENT:     Head: Normocephalic and atraumatic.  Eyes:     Conjunctiva/sclera: Conjunctivae normal.     Pupils: Pupils are equal, round, and reactive to light.  Neck:     Thyroid: No thyromegaly.  Cardiovascular:     Rate and Rhythm: Normal rate and regular rhythm.     Heart sounds: Normal heart sounds. No murmur heard. Pulmonary:     Effort: Pulmonary effort is normal. No respiratory distress.     Breath sounds: Normal breath sounds.  Abdominal:     General: There is no distension.     Palpations: Abdomen is soft.     Tenderness: There is no abdominal tenderness.  Musculoskeletal:     Cervical back: Normal range of motion and neck supple.  Lymphadenopathy:     Cervical: No cervical adenopathy.  Skin:    General: Skin is warm and dry.  Neurological:     Mental Status: She is alert and oriented to person, place, and time.  Psychiatric:        Behavior: Behavior normal.           Assessment & Plan:

## 2022-10-14 ENCOUNTER — Ambulatory Visit: Payer: Medicare HMO | Admitting: Family Medicine

## 2022-10-14 ENCOUNTER — Telehealth: Payer: Self-pay

## 2022-10-14 NOTE — Telephone Encounter (Signed)
Informed pt of lab results  

## 2022-10-14 NOTE — Telephone Encounter (Signed)
-----   Message from Midge Minium, MD sent at 10/14/2022  7:26 AM EST ----- Labs look FANTASTIC!!!  Keep up the good work!

## 2022-10-18 NOTE — Assessment & Plan Note (Signed)
Chronic problem.  Currently on Jardiance w/ good control.  Last A1C 6.6%.  UTD on foot exam, microalbumin, eye exam.  Following a low carb diet.  Is frustrated due to low energy.  Check labs.  Check labs.  Adjust meds prn

## 2022-10-18 NOTE — Assessment & Plan Note (Signed)
Chronic problem.  Currently on Praluent '150mg'$  q14 days, fenofibrate '145mg'$  daily, and Lovasa 2gm BID w/o difficulty.  Check labs.  Adjust meds prn

## 2022-10-18 NOTE — Assessment & Plan Note (Signed)
Check labs and replete prn. 

## 2022-10-18 NOTE — Assessment & Plan Note (Deleted)
Chronic problem.  Currently on Jardiance w/ good control.  Last A1C 6.6%.  UTD on foot exam, microalbumin, eye exam.  Following a low carb diet.  Is frustrated due to low energy.  Check labs.  Check labs.  Adjust meds prn

## 2022-10-18 NOTE — Assessment & Plan Note (Signed)
Chronic problem.  Excellent control on Losartan '50mg'$  daily and Metoprolol '50mg'$  daily.  Currently asymptomatic.  Check labs due to ARB but no anticipated med changes.  Will follow.

## 2022-10-18 NOTE — Assessment & Plan Note (Signed)
Recurrent problem for pt.  Sxs are usually mood related.  Check labs to r/o underlying cause.

## 2022-10-19 ENCOUNTER — Other Ambulatory Visit: Payer: Self-pay

## 2022-10-19 DIAGNOSIS — M6281 Muscle weakness (generalized): Secondary | ICD-10-CM

## 2022-10-19 MED ORDER — MELOXICAM 15 MG PO TABS: 15.0000 mg | ORAL_TABLET | Freq: Every day | ORAL | 3 refills | Status: DC

## 2022-10-20 ENCOUNTER — Other Ambulatory Visit: Payer: Self-pay

## 2022-10-20 MED ORDER — EMPAGLIFLOZIN 10 MG PO TABS: 10.0000 mg | ORAL_TABLET | Freq: Every day | ORAL | 3 refills | Status: DC

## 2022-10-29 ENCOUNTER — Encounter: Payer: Self-pay | Admitting: Family Medicine

## 2022-10-29 ENCOUNTER — Ambulatory Visit: Payer: Medicare Other | Admitting: Family Medicine

## 2022-10-29 VITALS — BP 130/80 | HR 68 | Temp 97.6°F | Resp 16 | Ht 69.0 in | Wt 192.4 lb

## 2022-10-29 DIAGNOSIS — R2681 Unsteadiness on feet: Secondary | ICD-10-CM | POA: Diagnosis not present

## 2022-10-29 NOTE — Patient Instructions (Signed)
Follow up as needed or as scheduled We'll call you to schedule your Neurology appt Make sure you are drinking plenty of water Try and get up and get moving so your legs feel better Call with any questions or concerns Stay Safe!  Stay Healthy! Happy Holidays!!!

## 2022-10-29 NOTE — Progress Notes (Signed)
   Subjective:    Patient ID: [name], female    DOB: [birth year], 66 y.o.   MRN: [id number]  HPI 'off balance'- pt reports 'it's more to the L'.  Pt says she 'fell into the wall'.  Yesterday she nearly fell into the Christmas tree.  No vertigo.  No recent med changes.  Reports she is taking all allergy medication.  Sxs started 2-3 months ago.  Episodic.  Thinks legs feel heavy.  Pt reports leg heaviness improves as she starts walking.   Review of Systems For ROS see HPI     Objective:   Physical Exam Vitals reviewed.  Constitutional:      General: She is not in acute distress.    Appearance: Normal appearance. She is not ill-appearing.  HENT:     Head: Normocephalic and atraumatic.     Right Ear: Tympanic membrane and ear canal normal.     Left Ear: Tympanic membrane and ear canal normal.     Nose: No congestion.  Eyes:     Extraocular Movements: Extraocular movements intact.     Conjunctiva/sclera: Conjunctivae normal.     Pupils: Pupils are equal, round, and reactive to light.  Musculoskeletal:     Cervical back: Normal range of motion and neck supple. No rigidity.  Skin:    General: Skin is warm and dry.  Neurological:     General: No focal deficit present.     Mental Status: She is alert and oriented to person, place, and time.     Cranial Nerves: No cranial nerve deficit.     Motor: No weakness.     Coordination: Coordination normal.     Gait: Gait normal.     Deep Tendon Reflexes: Reflexes normal.  Psychiatric:        Mood and Affect: Mood normal.        Behavior: Behavior normal.           Assessment & Plan:   Unsteady gait- new.  Pt reports that she will tend to fall towards her L.  This has been episodic for the last 2-3 months.  She is asymptomatic in office today.  Recent labs WNL.  Denies vertigo so no place for meclizine.  Her leg heaviness may be related to some of her medications but doesn't seem to be involved in her falling to the L and having  to catch herself as the heaviness improves w/ activity.  Refer back to neuro for complete evaluation.  Pt expressed understanding and is in agreement w/ plan.

## 2022-11-06 ENCOUNTER — Ambulatory Visit: Payer: Medicare Other | Admitting: Podiatry

## 2022-11-06 DIAGNOSIS — L600 Ingrowing nail: Secondary | ICD-10-CM | POA: Diagnosis not present

## 2022-11-06 NOTE — Patient Instructions (Signed)

## 2022-11-06 NOTE — Progress Notes (Signed)
Subjective:  Patient ID: [name], female    DOB: [birth year],  MRN: [id number]  Chief Complaint  Patient presents with   Ingrown Toenail    Right great toe possible ingrown - on going for about 1 month    66 y.o. female presents with the above complaint.  Patient presents with right hallux medial border ingrown painful to touch.  Hurts with ambulation or shoe pressure.  For 1 month pain scale 7 out of 10 dull achy in nature.  Would like to discuss treatment options.  She is a diabetic with controlled A1c of 6%.   Review of Systems: Negative except as noted in the HPI. Denies N/V/F/Ch.  Past Medical History:  Diagnosis Date   Allergic rhinitis    Allergy    Anxiety    Arthritis    R knee, Left Knee   Asthma    Bipolar disorder ([place])    Cataract    Complication of anesthesia    had bad anxiety after anesthesia   Constipation, chronic    Depression    Diabetes mellitus without complication (HCC)    Dysrhythmia    Palpations occ, takes Tenormin   GERD (gastroesophageal reflux disease)    Glaucoma    Heart murmur    Hx of adenomatous colonic polyps 2008   Hyperlipidemia    Hypertension    MVP (mitral valve prolapse)    Sleep apnea    wears CPAP   Urinary tract infection    frequent, see Urololgist 12/16/2010    Current Outpatient Medications:    Alirocumab (PRALUENT) 150 MG/ML SOAJ, INJECT 150MG INTO THE SKIN EVERY 14 DAYS, Disp: 2 mL, Rfl: 11   ALPHAGAN P 0.1 % SOLN, Place 1 drop into both eyes every 8 (eight) hours. , Disp: , Rfl:    azelastine (ASTELIN) 0.1 % nasal spray, PLACE 2 SPRAYS INTO EACH NOSTRIL TWO TIMES A DAY AS DIRECTED, Disp: 30 mL, Rfl: 2   blood glucose meter kit and supplies KIT, Dispense based on patient and insurance preference. Use up to four times daily as directed., Disp: 1 each, Rfl: 0   budesonide-formoterol (SYMBICORT) 160-4.5 MCG/ACT inhaler, INHALE TWO PUFFS BY MOUTH EVERY MORNING AND INHALE TWO PUFFS BY MOUTH EVERY NIGHT AT BEDTIME, Disp:  10.2 g, Rfl: 0   busPIRone (BUSPAR) 7.5 MG tablet, Take 1 tablet (7.5 mg total) by mouth 2 (two) times daily., Disp: 60 tablet, Rfl: 3   Cholecalciferol (VITAMIN D3) 50 MCG (2000 UT) CAPS, Take 2,000 Units by mouth daily., Disp: , Rfl:    Cholecalciferol 1.25 MG (50000 UT) TABS, Take 1 tablet by mouth once a week., Disp: 4 tablet, Rfl: 2   CLARAVIS 20 MG capsule, Take 20 mg by mouth daily., Disp: , Rfl:    clonazePAM (KLONOPIN) 2 MG tablet, 1 mg 2 (two) times daily. , Disp: , Rfl: 0   Cyanocobalamin (VITAMIN B-12 PO), Take by mouth., Disp: , Rfl:    empagliflozin (JARDIANCE) 10 MG TABS tablet, Take 1 tablet (10 mg total) by mouth daily before breakfast., Disp: 28 tablet, Rfl: 0   empagliflozin (JARDIANCE) 10 MG TABS tablet, Take 1 tablet (10 mg total) by mouth daily before breakfast., Disp: 30 tablet, Rfl: 3   fenofibrate (TRICOR) 145 MG tablet, Take 1 tablet (145 mg total) by mouth daily., Disp: 90 tablet, Rfl: 1   fexofenadine (ALLEGRA) 180 MG tablet, Take 1 tablet (180 mg total) by mouth daily., Disp: 90 tablet, Rfl: 3   fluticasone (FLONASE)  50 MCG/ACT nasal spray, SPRAY TWO SPRAYS IN EACH NOSTRIL ONCE DAILY, Disp: 16 g, Rfl: 3   gabapentin (NEURONTIN) 600 MG tablet, Take 1,200 mg by mouth 2 (two) times daily. , Disp: , Rfl:    glucose blood test strip, Use as instructed, Disp: 100 each, Rfl: 12   lamoTRIgine (LAMICTAL) 200 MG tablet, Take 200 mg by mouth 2 (two) times daily., Disp: , Rfl:    Lancets (ONETOUCH DELICA PLUS [id number]) MISC, Use twice daily to test blood sugars as directed Dx- uncontrolled DM II, Disp: 100 each, Rfl: 1   losartan (COZAAR) 50 MG tablet, Take 1 tablet (50 mg total) by mouth daily., Disp: 30 tablet, Rfl: 6   LUMIGAN 0.01 % SOLN, Place 1 drop into both eyes at bedtime. , Disp: , Rfl:    meloxicam (MOBIC) 15 MG tablet, Take 1 tablet (15 mg total) by mouth daily., Disp: 30 tablet, Rfl: 3   metoprolol succinate (TOPROL-XL) 50 MG 24 hr tablet, TAKE ONE TABLET BY MOUTH  DAILY WITH A MEAL, Disp: 90 tablet, Rfl: 2   montelukast (SINGULAIR) 10 MG tablet, Take 1 tablet (10 mg total) by mouth at bedtime., Disp: 90 tablet, Rfl: 1   Multiple Vitamin (MULTIVITAMIN WITH MINERALS) TABS tablet, Take 1 tablet by mouth daily., Disp: , Rfl:    OLANZapine (ZYPREXA) 5 MG tablet, Take 5 mg by mouth daily as needed., Disp: , Rfl:    omega-3 acid ethyl esters (LOVAZA) 1 g capsule, TAKE TWO CAPSULES BY MOUTH TWICE A DAY, Disp: 120 capsule, Rfl: 10   Pyridoxine HCl (VITAMIN B-6 PO), Take 1 tablet by mouth daily., Disp: , Rfl:    QUEtiapine (SEROQUEL XR) 400 MG 24 hr tablet, Take 400 mg by mouth at bedtime., Disp: , Rfl:    QUEtiapine (SEROQUEL) 50 MG tablet, Take 50 mg by mouth daily., Disp: , Rfl:    SUMAtriptan (IMITREX) 50 MG tablet, Take 1 tablet (50 mg total) by mouth once for 1 dose. May repeat in 2 hours if headache persists or recurs., Disp: 10 tablet, Rfl: 1   timolol (TIMOPTIC) 0.5 % ophthalmic solution, SMARTSIG:In Eye(s), Disp: , Rfl:    venlafaxine (EFFEXOR-XR) 75 MG 24 hr capsule, Take 75 mg by mouth. Take 2 tabs in the am and 1 tab in afternoonn, Disp: , Rfl:   Social History   Tobacco Use  Smoking Status Never   Passive exposure: Never  Smokeless Tobacco Never    Allergies  Allergen Reactions   Nitrofurantoin Monohyd Macro Nausea Only    Severe headache   Statins Other (See Comments)    Severe weakness, pain   Alprazolam Anxiety and Other (See Comments)    Hyper    Amoxicillin Rash   Prednisone Anxiety   Sulfa Antibiotics Nausea And Vomiting   Objective:  There were no vitals filed for this visit. There is no height or weight on file to calculate BMI. Constitutional Well developed. Well nourished.  Vascular Dorsalis pedis pulses palpable bilaterally. Posterior tibial pulses palpable bilaterally. Capillary refill normal to all digits.  No cyanosis or clubbing noted. Pedal hair growth normal.  Neurologic Normal speech. Oriented to person, place,  and time. Epicritic sensation to light touch grossly present bilaterally.  Dermatologic Painful ingrowing nail at medial nail borders of the hallux nail right. No other open wounds. No skin lesions.  Orthopedic: Normal joint ROM without pain or crepitus bilaterally. No visible deformities. No bony tenderness.   Radiographs: None Assessment:   1. Ingrown toenail  of right foot    Plan:  Patient was evaluated and treated and all questions answered.  Ingrown Nail, right -Patient elects to proceed with minor surgery to remove ingrown toenail removal today. Consent reviewed and signed by patient. -Ingrown nail excised. See procedure note. -Educated on post-procedure care including soaking. Written instructions provided and reviewed. -Patient to follow up in 2 weeks for nail check.  Procedure: Excision of Ingrown Toenail Location: Right 1st toe medial nail borders. Anesthesia: Lidocaine 1% plain; 1.5 mL and Marcaine 0.5% plain; 1.5 mL, digital block. Skin Prep: Betadine. Dressing: Silvadene; telfa; dry, sterile, compression dressing. Technique: Following skin prep, the toe was exsanguinated and a tourniquet was secured at the base of the toe. The affected nail border was freed, split with a nail splitter, and excised. Chemical matrixectomy was then performed with phenol and irrigated out with alcohol. The tourniquet was then removed and sterile dressing applied. Disposition: Patient tolerated procedure well. Patient to return in 2 weeks for follow-up.   No follow-ups on file.

## 2022-11-16 ENCOUNTER — Telehealth: Payer: Self-pay | Admitting: *Deleted

## 2022-11-16 NOTE — Telephone Encounter (Signed)
Patient is calling because her post procedural toe has some redness around the border, not a lot of pain, no pus or a  lot of swelling, wanting to make sure that this is normal, explained that it is ok , making that she is continuing to soak and cover afterwards , to call back if swelling , pain and develops any pus around the area, verbalized understanding.

## 2022-11-17 ENCOUNTER — Other Ambulatory Visit: Payer: Medicare Other

## 2022-11-17 MED ORDER — DOXYCYCLINE HYCLATE 100 MG PO TABS: 100.0000 mg | ORAL_TABLET | Freq: Two times a day (BID) | ORAL | 0 refills | Status: DC

## 2022-11-17 NOTE — Telephone Encounter (Signed)
Patient is requesting an antibiotic for her toe, is still redness outside of the nail,please advise

## 2022-11-29 ENCOUNTER — Ambulatory Visit
Admission: RE | Admit: 2022-11-29 | Discharge: 2022-11-29 | Disposition: A | Discharge status: Home / Self Care | Payer: Medicare Other | Source: Ambulatory Visit | Attending: Student | Admitting: Student

## 2022-11-29 DIAGNOSIS — M5416 Radiculopathy, lumbar region: Secondary | ICD-10-CM

## 2022-12-01 ENCOUNTER — Institutional Professional Consult (permissible substitution): Payer: Medicare Other | Admitting: Neurology

## 2022-12-04 ENCOUNTER — Other Ambulatory Visit: Payer: Self-pay

## 2022-12-04 ENCOUNTER — Telehealth: Payer: Self-pay | Admitting: Family Medicine

## 2022-12-04 DIAGNOSIS — G43809 Other migraine, not intractable, without status migrainosus: Secondary | ICD-10-CM

## 2022-12-04 MED ORDER — SUMATRIPTAN SUCCINATE 50 MG PO TABS: 50.0000 mg | ORAL_TABLET | Freq: Once | ORAL | 1 refills | Status: DC

## 2022-12-04 NOTE — Telephone Encounter (Signed)
Caller name: DOLL FRAZEE  On DPR?: Yes  Call back number: [phone] (mobile)  Provider they see: Midge Minium, MD  Reason for call: Patient currently has margarine. Patient wants to know if she can continue take SUMAtriptan (IMITREX) 50 MG tablet. If so patient will run out of medication before the weekend.

## 2022-12-04 NOTE — Telephone Encounter (Signed)
Ok to send Sumatriptan refill

## 2022-12-04 NOTE — Telephone Encounter (Signed)
Is this ok if so I can send in a refill for the Sumatriptan

## 2022-12-04 NOTE — Telephone Encounter (Signed)
Informed pt that we are sending in a refill of Sumatriptan to the pharmacy

## 2022-12-08 ENCOUNTER — Telehealth: Payer: Self-pay | Admitting: Family Medicine

## 2022-12-08 NOTE — Telephone Encounter (Signed)
Caller name: ARLETHA MARSCHKE  On DPR?: Yes  Call back number: [phone] (mobile)  Provider they see: Midge Minium, MD  Reason for call:  Patient called stating that she is still off balance and having  dizzy spells . Patient state that Dr.Saima can't see her until Tuesday next week. Patient states that she can't wait until next week. Patient wants to know what should do next.

## 2022-12-08 NOTE — Telephone Encounter (Signed)
Advised pt that per Dr Birdie Riddle she needs an apt  but someone needs to drive her and she can try OTC Meclizine or Dramamine . Pt expressed verbal understanding

## 2022-12-08 NOTE — Telephone Encounter (Signed)
Needs appt but needs someone to drive her.  In the meantime, should try OTC meclizine or Dramamine (motion sickness med) but someone else should pick this up for her as she shouldn't drive if dizzy.  Increase water intake.

## 2022-12-08 NOTE — Telephone Encounter (Signed)
Please advise how to proceed.

## 2022-12-11 ENCOUNTER — Encounter: Payer: Self-pay | Admitting: Family Medicine

## 2022-12-11 ENCOUNTER — Ambulatory Visit: Payer: Medicare Other | Admitting: Family Medicine

## 2022-12-11 VITALS — BP 134/82 | HR 73 | Temp 97.7°F | Ht 69.0 in | Wt 196.0 lb

## 2022-12-11 DIAGNOSIS — G935 Compression of brain: Secondary | ICD-10-CM

## 2022-12-11 DIAGNOSIS — R29898 Other symptoms and signs involving the musculoskeletal system: Secondary | ICD-10-CM | POA: Diagnosis not present

## 2022-12-11 DIAGNOSIS — R42 Dizziness and giddiness: Secondary | ICD-10-CM

## 2022-12-11 MED ORDER — DIAZEPAM 5 MG PO TABS: ORAL_TABLET | ORAL | 0 refills | Status: DC

## 2022-12-11 NOTE — Patient Instructions (Signed)
Follow up as needed or as scheduled We'll call you to get MRI scheduled Take the Diazepam prior to your MRI (do not drive after taking!) and you can take a 2nd one at the imaging center if needed Continue to drink LOTS of water Call with any questions or concerns Hang in there!

## 2022-12-11 NOTE — Progress Notes (Signed)
   Subjective:    Patient ID: [name], female    DOB: 11/30/1955, 67 y.o.   MRN: [id number]  HPI 'balance problem'- pt reports sxs started 'a couple weeks' ago.  Not consistent.  Reports she will 'lean a little bit' when sxs occur.  'it's when I first get up'.  Reports drinking a lot of water.  'i wonder if it's from nerves'.  Pt reports legs feel weak in the morning but seem to be ok as the day goes on.  Pt admits to worsening depression and anxiety- started seeing therapist again yesterday.  'trying to get my confidence level back'.  Has neurology appt 5/2.  Pt has known Chiari 1 malformation that on MRI 2022 showed progression from 2006.   Review of Systems For ROS see HPI     Objective:   Physical Exam Vitals reviewed.  Constitutional:      General: She is not in acute distress.    Appearance: Normal appearance. She is not ill-appearing.  HENT:     Head: Normocephalic and atraumatic.  Eyes:     Extraocular Movements: Extraocular movements intact.     Conjunctiva/sclera: Conjunctivae normal.     Pupils: Pupils are equal, round, and reactive to light.  Cardiovascular:     Rate and Rhythm: Normal rate and regular rhythm.  Pulmonary:     Effort: Pulmonary effort is normal. No respiratory distress.  Musculoskeletal:     Cervical back: Neck supple.  Skin:    General: Skin is warm and dry.  Neurological:     General: No focal deficit present.     Mental Status: She is alert and oriented to person, place, and time.     Cranial Nerves: No cranial nerve deficit.     Motor: No weakness.     Coordination: Coordination normal.     Gait: Gait normal.  Psychiatric:     Comments: anxious           Assessment & Plan:   Dizziness- pt reports sxs are episodic and will occur upon changing positions (sitting to standing).  Sxs do not occur each time she gets up.  She doesn't note a particular pattern.  Reports drinking adequate water.  Is wondering if this is anxiety related as  sxs have worsened recently.  Pt has neuro appt upcoming.  Will get MRI given known Chiari malformation.  Pt expressed understanding and is in agreement w/ plan.   Leg weakness- new.  Pt reports sxs occur first thing in the morning and then improve as day goes on.  May be related to hypoglycemia as that is the longest stretch she goes w/o eating.  Again, she has neuro appt upcoming and will get MRI.  Pt expressed understanding and is in agreement w/ plan.   Chiari 1 malformation- scan in 2022 showed progression from initial dx in 2006.  Repeat MRI.

## 2022-12-17 ENCOUNTER — Encounter: Payer: Self-pay | Admitting: Dietician

## 2022-12-17 ENCOUNTER — Encounter: Payer: Medicare Other | Attending: Family Medicine | Admitting: Dietician

## 2022-12-17 VITALS — Wt 200.0 lb

## 2022-12-17 DIAGNOSIS — E119 Type 2 diabetes mellitus without complications: Secondary | ICD-10-CM | POA: Insufficient documentation

## 2022-12-17 NOTE — Patient Instructions (Addendum)
Consider working with your counselor on Cognitive Behavior Therapy.  Stay out of the ANTS (Automatic Negative Thoughts)  Before you snack- What is HALT?  Why are you eating?  Hungry  Angry  Lonely  Tired   Bored or Depressed.  Goal:  Eat Breakfast, Lunch, Dinner daily.  Add a nourishing snack if hungry. What could you do instead of eating when you are not hungry? Do something each day to bring you joy!  Consider adding raw vegetables and hummus or salad with a lite dressing to dinner or as a snack if hungry.    [facility] [employer]  [employer] Podcast  Books by Sherlean Foot or family  Type 2 Diabetes Revolution by Lawson Radar, RD  Plant [employer] Cookbook by C. Bodrug

## 2022-12-17 NOTE — Progress Notes (Signed)
Diabetes Self-Management Education  Visit Type: Follow-up  Appt. Start Time: 0915 Appt. End Time: 1000  12/17/2022  [name], identified by name and date of birth, is a 67 y.o. female with a diagnosis of Diabetes:  .   ASSESSMENT Patient is here today alone.  She was last seen in the office on 09/12/2023.  She states that she has some stress eating.  She is struggling with eating too much peanut butter. She states that she has struggles with being tired of diabetes.  She blames herself for diabetes.  She sees a therapist but wishes for a support group.  Will add her to the Type 2 Support Group list. She states that she has been walking.  Has been thinking about joining a gym which is covered by her insurance. She is still pet sitting. She just got her guitar out of the shop and finds this therapeutic.    She states that she has too much time to herself. Fasting blood glucose 153 and sometimes 110  History includes:  Type 2 Diabetes, HTN, HLD, GERD, OSA on c-pap, constipation, GERD, bipolar. A1C 6% 10/14/2023 decreased from 6.6% 06/25/2022 and 8.2% 04/14/2022 decreased, eGFR 74 (05/04/2022), GFR 92, cholesterol 148, HDL 66, LDL 44, Triglycerides 189, vitamin D 20 on 04/09/21 Medications includes vitamin D, vitamin B-12, Jardiance   Weight hx: 200 lbs 12/17/2022 194 lbs 09/11/2022 201 lbs 06/08/2022 218 lbs 04/14/2022 219 lbs 06/23/2021 220 lbs 04/21/2021  222 lbs 04/07/2021 231 lbs January 2019 193 lbs 06/2018 207 lbs 11/22/2018   Patient lives with her dog.  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  She worked for [employer] prior to being downsized. She struggles with finances at times. She struggles with standing for long periods of time.   She has gone to a therapist but has not gone for a while.  She states that it helps when she talks with her cousin.  Being alone a lot causes her stress.  She states that she needs more structure. She enjoys playing guitar and singing.  She  writes her own songs  Weight 200 lb (90.7 kg), last menstrual period 11/09/2000. Body mass index is 29.53 kg/m.   Diabetes Self-Management Education - 12/17/22 1100       Visit Information   Visit Type Follow-up      Psychosocial Assessment   Patient Belief/Attitude about Diabetes Defeat/Burnout    What is the hardest part about your diabetes right now, causing you the most concern, or is the most worrisome to you about your diabetes?   Getting support / problem solving    Self-care barriers Other (comment)   stress/bipolar   Self-management support Doctor's office    Other persons present Patient    Patient Concerns Nutrition/Meal planning;Support;Problem Solving;Weight Control    Special Needs None    Preferred Learning Style No preference indicated    Learning Readiness Ready    How often do you need to have someone help you when you read instructions, pamphlets, or other written materials from your doctor or pharmacy? 1 - Never      Pre-Education Assessment   Patient understands the diabetes disease and treatment process. Comprehends key points    Patient understands incorporating nutritional management into lifestyle. Comprehends key points    Patient undertands incorporating physical activity into lifestyle. Comprehends key points    Patient understands using medications safely. Comprehends key points    Patient understands monitoring blood glucose, interpreting and using  results Comprehends key points    Patient understands prevention, detection, and treatment of acute complications. Comprehends key points    Patient understands prevention, detection, and treatment of chronic complications. Compreheands key points    Patient understands how to develop strategies to address psychosocial issues. Needs Review    Patient understands how to develop strategies to promote health/change behavior. Needs Review      Complications   Last HgB A1C per patient/outside source 6 %    10/2022   How often do you check your blood sugar? 1-2 times/day    Fasting Blood glucose range (mg/dL) 70-129;130-179    Number of hypoglycemic episodes per month 0    Number of hyperglycemic episodes ( >'200mg'$ /dL): Weekly      Dietary Intake   Breakfast egg OR steel cut oatmeal, blueberries    Lunch Lucky [employer] - grilled chicken, mashed potatoes, collards OR usually [place] and cheese sandwich on keto bread, fresh fruit    Dinner boca burger, vegetable, starch OR grilled chicken, rice, vegetable    Snack (evening) peanut butter, crackers    Beverage(s) water, sparkling water, occasional coffee with almond milk      Activity / Exercise   Activity / Exercise Type Light (walking / raking leaves)    How many days per week do you exercise? 5    How many minutes per day do you exercise? 30    Total minutes per week of exercise 150      Patient Education   Previous Diabetes Education Yes (please comment)   09/2022   Healthy Eating Meal options for control of blood glucose level and chronic complications.;Role of diet in the treatment of diabetes and the relationship between the three main macronutrients and blood glucose level    Being Active Role of exercise on diabetes management, blood pressure control and cardiac health.;Other (comment)    Medications Reviewed patients medication for diabetes, action, purpose, timing of dose and side effects.    Monitoring Taught/evaluated SMBG meter.    Diabetes Stress and Support Identified and addressed patients feelings and concerns about diabetes;Worked with patient to identify barriers to care and solutions;Role of stress on diabetes;Helped patient identify a support system for diabetes management    Lifestyle and Health Coping Lifestyle issues that need to be addressed for better diabetes care      Individualized Goals (developed by patient)   Nutrition General guidelines for healthy choices and portions discussed    Physical Activity Exercise 5-7 days  per week;30 minutes per day    Medications take my medication as prescribed    Monitoring  Test my blood glucose as discussed    Problem Solving Eating Pattern;Addressing barriers to behavior change    Reducing Risk examine blood glucose patterns    Health Coping Ask for help with psychological, social, or emotional issues      Patient Self-Evaluation of Goals - Patient rates self as meeting previously set goals (% of time)   Nutrition >75% (most of the time)    Physical Activity >75% (most of the time)    Medications >75% (most of the time)    Monitoring >75% (most of the time)    Problem Solving and behavior change strategies  >75% (most of the time)    Reducing Risk (treating acute and chronic complications) >27% (most of the time)    Health Coping 25 - 50% (sometimes)      Post-Education Assessment   Patient understands the diabetes disease and treatment  process. Demonstrates understanding / competency    Patient understands incorporating nutritional management into lifestyle. Demonstrates understanding / competency    Patient undertands incorporating physical activity into lifestyle. Demonstrates understanding / competency    Patient understands using medications safely. Demonstrates understanding / competency    Patient understands monitoring blood glucose, interpreting and using results Demonstrates understanding / competency    Patient understands prevention, detection, and treatment of acute complications. Demonstrates understanding / competency    Patient understands prevention, detection, and treatment of chronic complications. Demonstrates understanding / competency    Patient understands how to develop strategies to address psychosocial issues. Comprehends key points    Patient understands how to develop strategies to promote health/change behavior. Comprehends key points      Outcomes   Expected Outcomes Demonstrated interest in learning. Expect positive outcomes    Future  DMSE 4-6 wks    Program Status Not Completed      Subsequent Visit   Since your last visit have you continued or begun to take your medications as prescribed? Yes    Since your last visit have you experienced any weight changes? Gain    Weight Loss (lbs) 6    Since your last visit, are you checking your blood glucose at least once a day? Yes             Individualized Plan for Diabetes Self-Management Training:   Learning Objective:  Patient will have a greater understanding of diabetes self-management. Patient education plan is to attend individual and/or group sessions per assessed needs and concerns.   Plan:   Patient Instructions  Consider working with your counselor on Cognitive Behavior Therapy.  Stay out of the ANTS (Automatic Negative Thoughts)  Before you snack- What is HALT?  Why are you eating?  Hungry  Angry  Lonely  Tired   Bored or Depressed.  Goal:  Eat Breakfast, Lunch, Dinner daily.  Add a nourishing snack if hungry. What could you do instead of eating when you are not hungry? Do something each day to bring you joy!  Consider adding raw vegetables and hummus or salad with a lite dressing to dinner or as a snack if hungry.    [employer] [employer]  [employer]  Books by Sherlean Foot or family  Type 2 Diabetes Revolution by Lawson Radar, RD  Plant [employer] Cookbook by C. Bodrug  Expected Outcomes:  Demonstrated interest in learning. Expect positive outcomes  Education material provided:   If problems or questions, patient to contact team via:  Phone  Future DSME appointment: 4-6 wks

## 2022-12-22 ENCOUNTER — Other Ambulatory Visit: Payer: Self-pay

## 2022-12-22 MED ORDER — EMPAGLIFLOZIN 10 MG PO TABS: 10.0000 mg | ORAL_TABLET | Freq: Every day | ORAL | 3 refills | Status: DC

## 2022-12-22 MED ORDER — ONETOUCH DELICA PLUS LANCET30G MISC: 1 refills | Status: DC

## 2022-12-24 ENCOUNTER — Other Ambulatory Visit: Payer: Self-pay

## 2022-12-24 DIAGNOSIS — E119 Type 2 diabetes mellitus without complications: Secondary | ICD-10-CM

## 2022-12-24 MED ORDER — EMPAGLIFLOZIN 10 MG PO TABS: 10.0000 mg | ORAL_TABLET | Freq: Every day | ORAL | 3 refills | Status: DC

## 2023-01-01 ENCOUNTER — Encounter: Payer: Self-pay | Admitting: Family Medicine

## 2023-01-01 ENCOUNTER — Other Ambulatory Visit: Payer: Self-pay

## 2023-01-01 MED ORDER — MONTELUKAST SODIUM 10 MG PO TABS: 10.0000 mg | ORAL_TABLET | Freq: Every day | ORAL | 1 refills | Status: DC

## 2023-01-08 ENCOUNTER — Encounter: Payer: Self-pay | Admitting: Family Medicine

## 2023-01-08 ENCOUNTER — Ambulatory Visit: Payer: Medicare Other | Admitting: Family Medicine

## 2023-01-08 VITALS — BP 130/82 | HR 62 | Temp 97.9°F | Resp 18 | Ht 69.0 in | Wt 190.5 lb

## 2023-01-08 DIAGNOSIS — E119 Type 2 diabetes mellitus without complications: Secondary | ICD-10-CM | POA: Diagnosis not present

## 2023-01-08 NOTE — Progress Notes (Signed)
   Subjective:    Patient ID: [name], female    DOB: 13-Jul-1956, 67 y.o.   MRN: [id number]  HPI Hypoglycemia- pt is currently on Jardiance '10mg'$  daily.  Last A1C 6.0%.  Pt is down 6 lbs since last visit.  Pt reports she has been under considerable stress recently.  For the last week CBGs have been in the low 90s- today has been 85.  Pt reports she has had HA's and will 'get a heat wave down my back'.  Took glucose tabs and sugar came up to 135.  Pt usually has eggs for breakfast, has a sandwich for lunch, for dinner will have protein and vegetables.  Pt has been much more active recently.     Review of Systems For ROS see HPI     Objective:   Physical Exam Vitals reviewed.  Constitutional:      Appearance: Normal appearance.  HENT:     Head: Normocephalic and atraumatic.  Eyes:     Extraocular Movements: Extraocular movements intact.     Conjunctiva/sclera: Conjunctivae normal.     Pupils: Pupils are equal, round, and reactive to light.  Cardiovascular:     Rate and Rhythm: Normal rate and regular rhythm.  Pulmonary:     Effort: Pulmonary effort is normal. No respiratory distress.  Musculoskeletal:     Cervical back: Normal range of motion and neck supple.  Skin:    General: Skin is warm and dry.  Neurological:     General: No focal deficit present.     Mental Status: She is alert and oriented to person, place, and time.  Psychiatric:        Mood and Affect: Mood normal.        Behavior: Behavior normal.        Thought Content: Thought content normal.           Assessment & Plan:

## 2023-01-08 NOTE — Patient Instructions (Signed)
Follow up as needed or as scheduled Make sure you are eating and drinking regularly- at least 3 meals a day and snacks in between If you start to feel bad, check your sugar!  But don't drive yourself crazy!!! Make sure you are drinking lots of water Take Tylenol as needed for headaches Call with any questions or concerns Stay Safe!  Stay Healthy!! Have a great weekend!

## 2023-01-08 NOTE — Assessment & Plan Note (Signed)
Pt has been having lower blood sugars than she is used to.  They have been 90s-100s and this morning was 85.  Reassured her that while this is lower than her normal, this is still normal and not dangerous.  Reviewed that she needs to be eating regularly.  Drinking her water.  She has been more active recently and under more stress- which can also impact sugar.  Encouraged her to only check sugars if she is symptomatic as otherwise I think she will drive herself crazy.  She agrees.  Will follow.

## 2023-01-12 ENCOUNTER — Telehealth: Payer: Self-pay | Admitting: *Deleted

## 2023-01-12 ENCOUNTER — Institutional Professional Consult (permissible substitution): Payer: Medicare Other | Admitting: Neurology

## 2023-01-12 NOTE — Telephone Encounter (Signed)
Pt called in [facility].  No show less then 24 hours.  X 1.  Rescheduled to 02-08-2023.

## 2023-01-21 ENCOUNTER — Other Ambulatory Visit: Payer: Self-pay | Admitting: Interventional Cardiology

## 2023-01-25 ENCOUNTER — Telehealth: Payer: Self-pay

## 2023-01-25 ENCOUNTER — Encounter: Payer: Self-pay | Admitting: Family Medicine

## 2023-01-25 ENCOUNTER — Ambulatory Visit: Payer: Medicare Other | Admitting: Family Medicine

## 2023-01-25 VITALS — BP 126/84 | HR 66 | Temp 98.4°F | Resp 18 | Ht 69.0 in | Wt 193.0 lb

## 2023-01-25 DIAGNOSIS — N390 Urinary tract infection, site not specified: Secondary | ICD-10-CM

## 2023-01-25 LAB — POCT URINALYSIS DIPSTICK
Glucose, UA: POSITIVE — AB
Ketones, UA: POSITIVE
Protein, UA: NEGATIVE
Spec Grav, UA: 1.015 (ref 1.010–1.025)
Urobilinogen, UA: 0.2 E.U./dL
pH, UA: 7.5 (ref 5.0–8.0)

## 2023-01-25 MED ORDER — CIPROFLOXACIN HCL 500 MG PO TABS: 500.0000 mg | ORAL_TABLET | Freq: Two times a day (BID) | ORAL | 0 refills | Status: AC

## 2023-01-25 NOTE — Telephone Encounter (Signed)
Pt dropped off a Patient assistance form to be signed for her Rx Jardiance by Dr Birdie Riddle . Placed in Dr Birdie Riddle to be signed folder

## 2023-01-25 NOTE — Progress Notes (Signed)
   Subjective:    Patient ID: [name], female    DOB: [birth year], 67 y.o.   MRN: [id number]  HPI UTI- sxs started Friday w/ dysuria.  At the end of urination would have chills.  + chills, 'i feel very weak'.  Went to [facility] and the gave her Macrodantin which she is intolerant to.  Pt has increased her water intake and is having increased frequency of urination.  + suprapubic pain.  No pain in kidneys.   Review of Systems For ROS see HPI     Objective:   Physical Exam Vitals reviewed.  Constitutional:      General: She is not in acute distress.    Appearance: Normal appearance. She is well-developed. She is not ill-appearing.  Abdominal:     General: There is no distension.     Palpations: Abdomen is soft.     Tenderness: There is abdominal tenderness (+ suprapubic TTP but no CVA tenderness).  Skin:    General: Skin is warm and dry.  Neurological:     General: No focal deficit present.     Mental Status: She is alert and oriented to person, place, and time.  Psychiatric:        Mood and Affect: Mood normal.        Behavior: Behavior normal.        Thought Content: Thought content normal.           Assessment & Plan:  UTI- new.  Pt is allergic to macrobid, sulfa, and PCN.  This leaves Cipro as our best choice to tx infection w/o adverse effects.  Reviewed supportive care and red flags that should prompt return.   Pt expressed understanding and is in agreement w/ plan.

## 2023-01-25 NOTE — Patient Instructions (Signed)
Follow up as needed or as scheduled START the Cipro twice daily- take w/ food Continue to drink LOTS of water Call with any questions or concerns Hang in there!!!

## 2023-01-27 ENCOUNTER — Other Ambulatory Visit: Payer: Self-pay

## 2023-01-27 ENCOUNTER — Telehealth: Payer: Self-pay

## 2023-01-27 ENCOUNTER — Encounter: Payer: Medicare Other | Admitting: Pharmacist

## 2023-01-27 DIAGNOSIS — E119 Type 2 diabetes mellitus without complications: Secondary | ICD-10-CM

## 2023-01-27 LAB — URINE CULTURE
MICRO NUMBER:: 14706090
SPECIMEN QUALITY:: ADEQUATE

## 2023-01-27 MED ORDER — EMPAGLIFLOZIN 10 MG PO TABS: 10.0000 mg | ORAL_TABLET | Freq: Every day | ORAL | 3 refills | Status: DC

## 2023-01-27 NOTE — Telephone Encounter (Signed)
Form signed and returned to [facility] 

## 2023-01-27 NOTE — Telephone Encounter (Signed)
-----   Message from Midge Minium, MD sent at 01/27/2023  3:27 PM EDT ----- Your UTI is being treated appropriately- great news!

## 2023-01-27 NOTE — Telephone Encounter (Signed)
Informed pt of lab results  

## 2023-01-27 NOTE — Telephone Encounter (Signed)
Faxed form and placed in scan   

## 2023-01-28 ENCOUNTER — Other Ambulatory Visit: Payer: Self-pay

## 2023-01-28 ENCOUNTER — Ambulatory Visit: Payer: Medicare Other | Admitting: Dietician

## 2023-01-28 ENCOUNTER — Telehealth: Payer: Self-pay | Admitting: Family Medicine

## 2023-01-28 MED ORDER — CIPROFLOXACIN HCL 500 MG PO TABS: 500.0000 mg | ORAL_TABLET | Freq: Two times a day (BID) | ORAL | 0 refills | Status: AC

## 2023-01-28 NOTE — Telephone Encounter (Signed)
Given her allergies, we will repeat the 3 day course of Cipro.  1 tab BID x3 days, #6 no refills.  If no better after that, will need to provide an additional urine sample

## 2023-01-28 NOTE — Telephone Encounter (Signed)
Spoke to the pt and she states she is hurting when urinating and burning she has finished her ABX . Pt is asking if we can give her anything else ?

## 2023-01-28 NOTE — Telephone Encounter (Signed)
Caller name: ARIELLAH DOONEY  On DPR?: Yes  Call back number: [phone] (home)  Provider they see: Midge Minium, MD  Reason for call:Pt called stating she's still burning while urinating- advise

## 2023-01-28 NOTE — Telephone Encounter (Signed)
Informed pt that we sent in Cipro  500 mg what she was on previously 1 tab BID x 3 days # 6 to the pharmacy . Advised if she is not better after this round of ABX she will need to be seen again  . Pt expressed verbal understanding

## 2023-01-29 ENCOUNTER — Emergency Department (HOSPITAL_BASED_OUTPATIENT_CLINIC_OR_DEPARTMENT_OTHER)
Admission: EM | Admit: 2023-01-29 | Discharge: 2023-01-29 | Disposition: A | Discharge status: Home / Self Care | Payer: Medicare Other | Attending: Emergency Medicine | Admitting: Emergency Medicine

## 2023-01-29 ENCOUNTER — Other Ambulatory Visit: Payer: Self-pay

## 2023-01-29 ENCOUNTER — Encounter (HOSPITAL_BASED_OUTPATIENT_CLINIC_OR_DEPARTMENT_OTHER): Payer: Self-pay

## 2023-01-29 ENCOUNTER — Encounter: Payer: Self-pay | Admitting: Pharmacist

## 2023-01-29 DIAGNOSIS — E119 Type 2 diabetes mellitus without complications: Secondary | ICD-10-CM | POA: Insufficient documentation

## 2023-01-29 DIAGNOSIS — Z7984 Long term (current) use of oral hypoglycemic drugs: Secondary | ICD-10-CM | POA: Diagnosis not present

## 2023-01-29 DIAGNOSIS — R300 Dysuria: Secondary | ICD-10-CM | POA: Insufficient documentation

## 2023-01-29 DIAGNOSIS — N899 Noninflammatory disorder of vagina, unspecified: Secondary | ICD-10-CM | POA: Diagnosis not present

## 2023-01-29 LAB — URINALYSIS, ROUTINE W REFLEX MICROSCOPIC
Bacteria, UA: NONE SEEN
Bilirubin Urine: NEGATIVE
Glucose, UA: 500 mg/dL — AB
Hgb urine dipstick: NEGATIVE
Ketones, ur: NEGATIVE mg/dL
Leukocytes,Ua: NEGATIVE
Nitrite: NEGATIVE
Protein, ur: NEGATIVE mg/dL
Specific Gravity, Urine: <1.005 — ABNORMAL LOW (ref 1.005–1.030)
pH: 6.5 (ref 5.0–8.0)

## 2023-01-29 LAB — WET PREP, GENITAL
Clue Cells Wet Prep HPF POC: NONE SEEN
Sperm: NONE SEEN
Trich, Wet Prep: NONE SEEN
WBC, Wet Prep HPF POC: >=10 — AB (ref <10)
Yeast Wet Prep HPF POC: NONE SEEN

## 2023-01-29 NOTE — Discharge Instructions (Signed)
Please follow-up with your primary care doctor, and urogynecology.  Dr. Quintella Baton is a your gynecologist, I recommend you follow-up with her.  Additionally there is [facility], that you can also follow-up with.  You can use a vaginal moisturizer 2-3 times a week to help with your atrophic vagina.  This may help with some of the lubrication, which may help with some of the burning sensation.  There is no evidence of UTI today, or any evidence of a yeast infection or bacterial vaginosis.  Return to the ER if you start having abdominal pain, fever, chills.

## 2023-01-29 NOTE — ED Provider Notes (Signed)
[facility] Provider Note   CSN: [id number] Arrival date & time: 01/29/23  1516     History  Chief Complaint  Patient presents with   Urinary Frequency    [name] is a 67 y.o. female, history of diabetes, bipolar disorder, who presents to the ED secondary to vaginal itching, irritation, burning for the last week and a half.  She states she has been on 2 different antibiotics, without relief, is on her second dose of Cipro, and continues to have symptoms.  Notes that she has tried several things, even went to urgent care, and they gave her a drug that made her feel crazy.  States "my vagina feels like it is on fire."  Notes that no matter what she has taken, her symptoms have not improved.  Denies any vaginal discharge, vaginal bleeding, flank pain, abdominal pain.    Home Medications Prior to Admission medications   Medication Sig Start Date End Date Taking? Authorizing Provider  ciprofloxacin (CIPRO) 500 MG tablet Take 1 tablet (500 mg total) by mouth 2 (two) times daily for 3 days. 01/28/23 01/31/23  Midge Minium, MD  ALPHAGAN P 0.1 % SOLN Place 1 drop into both eyes every 8 (eight) hours.  05/03/20   [provider]  azelastine (ASTELIN) 0.1 % nasal spray PLACE 2 SPRAYS INTO EACH NOSTRIL TWO TIMES A DAY AS DIRECTED 08/22/21   Midge Minium, MD  blood glucose meter kit and supplies KIT Dispense based on patient and insurance preference. Use up to four times daily as directed. 04/15/22   Midge Minium, MD  busPIRone (BUSPAR) 7.5 MG tablet Take 1 tablet (7.5 mg total) by mouth 2 (two) times daily. 07/20/22   Midge Minium, MD  Cholecalciferol (VITAMIN D3) 50 MCG (2000 UT) CAPS Take 2,000 Units by mouth daily.    [provider]  Cholecalciferol 1.25 MG (50000 UT) TABS Take 1 tablet by mouth once a week. 02/13/22   Waunita Schooner, MD  CLARAVIS 20 MG capsule Take 20 mg by mouth daily. 07/11/21   [provider]  clonazePAM (KLONOPIN) 2 MG tablet 1 mg 2 (two) times daily.  10/23/15   [provider]  Cyanocobalamin (VITAMIN B-12 PO) Take by mouth.    [provider]  diazepam (VALIUM) 5 MG tablet Take 1 tab prior to MRI and take 2nd tab if needed after arrival 12/11/22   Midge Minium, MD  empagliflozin (JARDIANCE) 10 MG TABS tablet Take 1 tablet (10 mg total) by mouth daily before breakfast. 08/26/22   Jettie Booze, MD  empagliflozin (JARDIANCE) 10 MG TABS tablet Take 1 tablet (10 mg total) by mouth daily before breakfast. 01/27/23   Midge Minium, MD  fenofibrate (TRICOR) 145 MG tablet Take 1 tablet (145 mg total) by mouth daily. 08/10/22   Midge Minium, MD  fexofenadine (ALLEGRA) 180 MG tablet Take 1 tablet (180 mg total) by mouth daily. 09/01/22   Midge Minium, MD  fluticasone Asencion Islam) 50 MCG/ACT nasal spray SPRAY TWO SPRAYS IN EACH NOSTRIL ONCE DAILY 03/17/22   Midge Minium, MD  gabapentin (NEURONTIN) 600 MG tablet Take 1,200 mg by mouth 2 (two) times daily.     [provider]  glucose blood test strip Use as instructed 04/15/22   Midge Minium, MD  lamoTRIgine (LAMICTAL) 200 MG tablet Take 200 mg by mouth 2 (two) times daily.    [provider]  Lancets (ONETOUCH DELICA PLUS Q000111Q) MISC Use twice daily to test blood sugars as directed Dx- uncontrolled DM II 12/22/22   Midge Minium, MD  losartan (COZAAR) 50 MG tablet Take 1 tablet (50 mg total) by mouth daily. 05/27/22   Midge Minium, MD  LUMIGAN 0.01 % SOLN Place 1 drop into both eyes at bedtime.  10/19/19   [provider]  meloxicam (MOBIC) 15 MG tablet Take 1 tablet (15 mg total) by mouth daily. 10/19/22   Midge Minium, MD  metoprolol succinate (TOPROL-XL) 50 MG 24 hr tablet TAKE ONE TABLET BY MOUTH DAILY WITH A MEAL 06/10/22   Midge Minium, MD  montelukast (SINGULAIR) 10 MG tablet Take 1 tablet (10 mg total) by mouth at  bedtime. 01/01/23   Midge Minium, MD  Multiple Vitamin (MULTIVITAMIN WITH MINERALS) TABS tablet Take 1 tablet by mouth daily.    [provider]  nitrofurantoin, macrocrystal-monohydrate, (MACROBID) 100 MG capsule Take 100 mg by mouth 2 (two) times daily. 01/24/23   [provider]  OLANZapine (ZYPREXA) 5 MG tablet Take 5 mg by mouth daily as needed.    [provider]  omega-3 acid ethyl esters (LOVAZA) 1 g capsule TAKE TWO CAPSULES BY MOUTH TWICE A DAY 02/04/22   Jettie Booze, MD  PRALUENT 150 MG/ML SOAJ DIAL AND INJECT 150MG  INTO THE SKIN ONCE EVERY 14 DAYS 01/22/23   Jettie Booze, MD  Pyridoxine HCl (VITAMIN B-6 PO) Take 1 tablet by mouth daily.    [provider]  QUEtiapine (SEROQUEL XR) 400 MG 24 hr tablet Take 400 mg by mouth at bedtime.    [provider]  QUEtiapine (SEROQUEL) 50 MG tablet Take 50 mg by mouth daily. 04/29/22   [provider]  SUMAtriptan (IMITREX) 50 MG tablet Take 1 tablet (50 mg total) by mouth once for 1 dose. May repeat in 2 hours if headache persists or recurs. 12/04/22 12/04/22  Midge Minium, MD  timolol (TIMOPTIC) 0.5 % ophthalmic solution SMARTSIG:In Eye(s) 01/27/22   [provider]  venlafaxine (EFFEXOR-XR) 75 MG 24 hr capsule Take 75 mg by mouth. Take 2 tabs in the am and 1 tab in afternoonn    [provider]      Allergies    Nitrofurantoin monohyd macro, Statins, Alprazolam, Amoxicillin, Prednisone, and Sulfa antibiotics    Review of Systems   Review of Systems  Genitourinary:  Positive for dysuria. Negative for vaginal bleeding and vaginal discharge.    Physical Exam Updated Vital Signs BP (!) 142/81 (BP Location: Right Arm)   Pulse 65   Temp 98.3 F (36.8 C)   Resp 16   Ht 5\' 9"  (1.753 m)   Wt 87.5 kg   LMP 11/09/2000   SpO2 100%   BMI 28.50 kg/m  Physical Exam Vitals and nursing note reviewed.  Constitutional:      General: She is not in  acute distress.    Appearance: She is well-developed.  HENT:     Head: Normocephalic and atraumatic.  Eyes:     Conjunctiva/sclera: Conjunctivae normal.  Cardiovascular:     Rate and Rhythm: Normal rate and regular rhythm.     Heart sounds: No murmur heard. Pulmonary:     Effort: Pulmonary effort is normal. No respiratory distress.     Breath sounds: Normal breath sounds.  Abdominal:     Palpations: Abdomen is soft.     Tenderness: There is no abdominal tenderness.  Genitourinary:  Comments: Deferred per pt request Musculoskeletal:        General: No swelling.     Cervical back: Neck supple.  Skin:    General: Skin is warm and dry.     Capillary Refill: Capillary refill takes less than 2 seconds.  Neurological:     Mental Status: She is alert.  Psychiatric:        Mood and Affect: Mood normal.     ED Results / Procedures / Treatments   Labs (all labs ordered are listed, but only abnormal results are displayed) Labs Reviewed  WET PREP, GENITAL - Abnormal; Notable for the following components:      Result Value   WBC, Wet Prep HPF POC >=10 (*)    All other components within normal limits  URINALYSIS, ROUTINE W REFLEX MICROSCOPIC - Abnormal; Notable for the following components:   Specific Gravity, Urine <1.005 (*)    Glucose, UA 500 (*)    All other components within normal limits    EKG None  Radiology No results found.  Procedures Procedures    Medications Ordered in ED Medications - No data to display  ED Course/ Medical Decision Making/ A&P                             Medical Decision Making Patient is a 67 year old female, here for urinary symptoms, has been on several doses of antibiotics without relief.  Denies any vaginal discharge states her vagina just feels like it is on fire.  Is not sexually active.  We will test for wet prep, as well as urinalysis for further evaluation.  Amount and/or Complexity of Data Reviewed Labs: ordered.     Details: Unremarkable several white blood cells per wet prep, but no evidence of bacteria in urine Discussion of management or test interpretation with external provider(s): Discussed with patient, negative wet prep, negative urine, possible secondary to atrophic vaginitis.  I have requested that she follow-up with your gynecologist, versus gynecologist she may need topical estrogen.  We discussed moisturizers for her vagina, and return precautions.  Declined pelvic exam.   Final Clinical Impression(s) / ED Diagnoses Final diagnoses:  Dysuria    Rx / DC Orders ED Discharge Orders     None         Osvaldo Shipper, PA 01/29/23 1750    Tegeler, Gwenyth Allegra, MD 01/29/23 1806

## 2023-01-29 NOTE — ED Triage Notes (Signed)
Pt to er, pt states that she is here for a "bad uti", [facility], states that they gave her a medication that made her feel worse and made her feel crazy, states that she stopped taking it and followed up with her md and was changed to cipro, states that she was still having symptoms, states that she is here for some burning and pain.

## 2023-01-29 NOTE — ED Notes (Signed)
Pt verbalized understanding of d/c instructions, meds, and followup care. Denies questions. VSS, no distress noted. Steady gait to exit with all belongings.  ?

## 2023-01-29 NOTE — Telephone Encounter (Signed)
If she is still symptomatic, she will need to return for another urine and culture (since she has so many allergies).  If she cannot wait until next week, she could consider [facility] or Urgent Care but I would make sure they do a culture

## 2023-01-29 NOTE — Telephone Encounter (Signed)
Patient stating that she is on her 2nd round of antibiotic for a UTI. Patient states that she's still burning when she while  urinating. Patient stating that she is burning more today

## 2023-01-29 NOTE — Telephone Encounter (Signed)
I spoke to the pt and advised that if she is still symptomatic she will need to be seen in office on Monday or go to the Urgent care due to all of her medicine allergies .

## 2023-01-29 NOTE — Telephone Encounter (Signed)
Pt states she is now burning more w/ urination and taking her ABX as prescribed

## 2023-02-01 ENCOUNTER — Encounter: Payer: Medicare Other | Admitting: Pharmacist

## 2023-02-03 ENCOUNTER — Other Ambulatory Visit: Payer: Self-pay

## 2023-02-03 DIAGNOSIS — E782 Mixed hyperlipidemia: Secondary | ICD-10-CM

## 2023-02-03 MED ORDER — FENOFIBRATE 145 MG PO TABS: 145.0000 mg | ORAL_TABLET | Freq: Every day | ORAL | 1 refills | Status: DC

## 2023-02-08 ENCOUNTER — Encounter: Payer: Self-pay | Admitting: Neurology

## 2023-02-08 ENCOUNTER — Ambulatory Visit: Payer: Medicare Other | Admitting: Neurology

## 2023-02-08 VITALS — BP 96/64 | HR 69 | Ht 69.0 in | Wt 187.4 lb

## 2023-02-08 DIAGNOSIS — G4733 Obstructive sleep apnea (adult) (pediatric): Secondary | ICD-10-CM

## 2023-02-08 DIAGNOSIS — R2689 Other abnormalities of gait and mobility: Secondary | ICD-10-CM | POA: Diagnosis not present

## 2023-02-08 NOTE — Progress Notes (Signed)
Subjective:    Patient ID: [name] is a 67 y.o. female.  HPI    Star Age, MD, PhD [facility] [address], [address] [place], [place] [address]  Dear [name],    I saw your patient, [name], upon your kind request, in my neurologic clinic today for evaluation of her gait disorder, The patient is unaccompanied today. She missed an appointment on 01/12/2023. I have evaluated her for sleep apnea and paresthesias in the past. As you know, [name] is a 66 year old female with an underlying complex medical history of asthma, allergies, bipolar disorder, reflux disease, hypertension, hyperlipidemia, mitral valve prolapse, sleep apnea, and overweight state, who reports intermittent balance problems several months ago. She has improved in the past 2 to 3 months and actually has been back to baseline she feels.  She reports that she felt that her equilibrium was off, things were moving but no actual spinning sensation, no nausea or vomiting, no sudden onset of severe headaches, no one-sided weakness or numbness or tingling or droopy face or slurring of speech, did not have any falls.  I reviewed your office note from 10/29/2022.  She reported feeling off balance. She was encouraged to stay well-hydrated and to stay active physically.  She has not had any recent falls.  She is scheduled for a brain MRI with and without contrast later this month, she was scheduled for 02/15/2023 but called and rescheduled this appointment because she also has an upcoming appointment for lower back injection.  She reports that she is allergic to prednisone, under allergies it says that she reacts with anxiety.  She is worried about her lower back injection but is advised to talk to them directly about her concerns and what they actually use for the injection.     Of note, she is on multiple medications including psychotropic medications including clonazepam 1 mg twice daily,  Valium as needed for [place], Lamictal, Zyprexa as needed, Seroquel 50 mg once daily as needed and 400 mg at bedtime long-acting.  She also takes a fairly high dose of venlafaxine.  She reports fair compliance with her CPAP, I reviewed her latest compliance data from 01/05/2023 through 02/03/2023, she used her machine 24 out of 30 days with percent use days greater than 4 hours at 63%, indicating mildly suboptimal compliance with an average usage for days on treatment of 6 hours and 28 minutes, residual AHI at goal at 2/h, pressure of 10 cm with EPR of 3.  Leak acceptable with some fluctuation, 95th percentile at 18.7 L/min.  She hydrates well with water, she limits her caffeine, does not drink any daily caffeine, does not drink any alcohol, she is a non-smoker.  She has no problems hearing, has intermittent tinnitus on the right but this is not a new issue for her.    Previously:   09/08/21: 67 year old right-handed woman with an underlying complex medical history of hypertension, hyperlipidemia, mitral valve prolapse, sleep apnea, allergies, arthritis, asthma, anxiety, depression, with a diagnosis of bipolar disorder, followed by psychiatry, reflux disease, Chiari malformation, and obesity, who reports approximately 24-month history of intermittent pins-and-needles sensation affecting the upper body, not the face, different places including her arms and legs, independently, lasts briefly for a few seconds, is not actually pacing home is annoying.  She reports that she was diagnosed with shingles.  She had a rash that started in early September and was located left distal anterior leg.  She has  a little bit of the remnant as an scarring of her skin.  She did not have any blisters.  No widespread vesicular rash.  She reports that she was vaccinated for shingles.  She was treated with Valtrex 1000 mg 3 times daily for a week, she completed the course.  Earlier this month she filled a refill on the Valtrex but has not  actually taken it.  She has been on gabapentin per psychiatry at 600 mg 4 times daily but this was increased to a current dose of 1200 mg 3 times daily.   Hydroxyzine was also added at 5 to 10 mg 3 times daily as needed. Of note, per psychiatry, she is currently on Zyprexa 5 mg daily, Seroquel 400 mg at bedtime, clonazepam 2 mg strength 1 pill twice daily, Effexor 75 mg strength 2 pills in the morning and 1 in the afternoon, Lamictal 200 mg twice daily.   She had a cervical spine MRI without contrast as well as lumbar spine MRI with and without contrast under Dr. Trenton Gammon on 12/13/2020 with I reviewed the results:   IMPRESSION: 1. Findings consistent with Chiari 1 malformation with crowding of the foramen magnum as seen on MRI of the brain performed December 01, 2020. 2. Multilevel degenerative changes of the cervical spine as described above with mild spinal canal stenosis and severe right neural foraminal narrowing at C5-C6. 3. Postsurgical changes from left laminectomy at L5-S1. No significant peridural fibrosis. 4. No high-grade spinal canal stenosis at any level.   She had a brain MRI with and without contrast on 12/01/2020 and I reviewed the results:   IMPRESSION: 1. Chiari I malformation with greatly progressive cerebellar tonsillar ectopia since 2006. 2. Otherwise unremarkable appearance of the brain for age.   She reports that the increase in gabapentin did not seem to make a difference.  She would rather go back to the 600 mg 4 times daily that she was per psychiatry.  She is not noticing any obvious alleviating or positional factors or aggravating factors.  She is not in constant pain, she denies any numbness.  She has not fallen recently but does report that he twisted her left ankle about 2 months ago and had seen orthopedics for this, she was told that she had a nondisplaced fracture of the left ankle and she was given a brace.   She reports compliance with her CPAP.  I was able to  review her CPAP compliance from 08/09/2021 through 09/07/2021, which is a total of 30 days, during which time she used her machine 28 days with percent use days greater than 4 hours at 83%, indicating very good compliance, average usage of 6 hours and 54 minutes, residual AHI at goal at 3/h, leak on the higher side with a 95th percentile at 25.8 L/min on a pressure of 10 cm with EPR of 3.  Her DME company is [employer].     She reports that she tries to hydrate well but she does have significant mouth dryness.  She reports that she takes Seroquel 400 mg at bedtime and 50 mg as needed.     She had a home sleep test on 03/11/2020 which showed an AHI of 14.7/h, O2 nadir 86%.  She had a follow-up appointment subsequently with [name], nurse practitioner on 06/11/2020, at which time she was compliant with her CPAP of 11 cm.     02/13/20: 67 year old right-handed woman with an underlying medical history of bipolar disorder, obesity, mitral valve  prolapse, hyperlipidemia, allergic rhinitis, palpitations, constipation, heart murmur, chronic cough, reflux disease, anxiety, and low back pain status post back surgery in 2001, arthritis, status post left total knee arthroplasty and right total knee arthroplasty, tonsillectomy, and hysterectomy, and status post nasal surgeries twice, who reports Feeling sleepy during the day.  Of note, she is on multiple potentially sedating medications.  She is followed by psychiatry.  In particular, she is on high-dose clonazepam, 2 mg strength, half a pill up to 4 times a day, she is also on Seroquel long-acting and Seroquel immediate release, Effexor XR high-dose, 225 mg daily, Zyprexa 10 mg strength, Robaxin, Lamictal, high-dose gabapentin 600 mg 4 times a day.  She reports that she gets sleepy while driving.  She has a friend that she likes to visit in [place] and she has felt like she is hypnotized while driving and has veered out of her lane.  She is strongly and repeatedly  discouraged from driving while sleepy and to address her medication regimen with her psychiatrist as soon as possible.  In the meantime, I have strongly discouraged her from driving any longer distances, such as to [place].    I have followed her in the past for sleep apnea. She was last seen over 4 years ago.  I reviewed her CPAP compliance data from 01/12/2020 through 02/10/2020 which is a total of 30 days, during which time she used her machine 27 days with percent used days greater than 4 hours at 87%, indicating very good compliance with an average usage of 8 hours and 28 minutes, residual AHI at goal at 3.8/h, leak on the high side with a 95th percentile at 28.4 L/min on a pressure of 11 cm with EPR of 3.  She reports that the machine is old and she would like to get a new machine.  According to our records, her set up date was 01/28/2015.  She is a non-smoker.  She drinks alcohol rarely.  She drinks caffeine in the form of coffee, 2 cups/day on average.  She gets her supplies from [employer].  Bedtime is around 11 PM or midnight and rise time around 8.  She denies any night to night nocturia or recurrent morning headaches.  She lives alone, she has a small dog in the household. Her Epworth sleepiness score is 6/24, fatigue severity score is 40/63.        02/10/2016: I reviewed her CPAP compliance data from 12/13/2015 through 01/11/2016 which is a total of 30 days during which time she used her machine 17 days with percent used days greater than 4 hours at 53%, indicating suboptimal compliance with an average usage of 4 hours and 10 minutes only for all days. Residual AHI 1.9 per hour, leak acceptable with the 95th percentile at 15.2 L/m on a pressure of 11 cm with EPR of 3.   I reviewed her CPAP compliance data from 10/13/2015 through 11/11/2015 which is a total of 30 days during which time she used her machine 21 days with percent used days greater than 4 hours at 63%, indicating suboptimal compliance  with an average usage of 4 hours and 36 minutes, residual AHI 3 per hour, leak acceptable with the 95th percentile at 8.5 L/m on a pressure of 11 cm with EPR of 3.    I reviewed her CPAP compliance data from 09/01/2015 through 09/30/2015 which is a total of 30 days during which time she used her machine 23 days with percent used days greater  than 4 hours at 67% only, indicating suboptimal compliance with an average usage of 4 hours and 55 minutes, residual AHI borderline at 5.1 per hour, leak acceptable with the 95th percentile at 10.4 L/m on a pressure of 11 cm with EPR of 3.     She reports that she needs supplies. She moved to a smaller townhome recently and lost her mask and other parts of her headgear. She noticed worsening of her daytime somnolence, mood irritability and memory issues since stopping CPAP for the past month or so. She is motivated to get back on treatment. She admits that she thought she would do okay without treatment. Weight has been fairly stable.    I saw her on saw her on 03/27/2015, at which time she reported feeling better. She was feeling better rested. She did have more stress and was worrying about her brother. She was motivated to try to lose weight and exercise more. Overall, she felt that she was doing better and her morning headaches had also improved as well as her nocturia. She was compliant with treatment.     I first met her on 09/13/2014 at the request of her primary care physician. At which time the patient reported excessive daytime somnolence and snoring. I invited her back for sleep study. She had a baseline sleep study, followed by a CPAP titration study and I went over her test results with her in detail today. Her baseline sleep study from 11/27/2014 showed a sleep efficiency of 77.7%. Sleep latency was prolonged. Wake after sleep onset was 39.5 minutes with mild to moderate sleep fragmentation. She had an increased percentage of stage I and stage II sleep,  absence of slow-wave sleep and absence of REM sleep. She had moderate to loud snoring. Total AHI was 5.8 per hour, rising to 15.8 per hour in the supine position. Average oxygen saturation was only 90%. Nadir was 77%. Based on her significant oxygen desaturations I invited her back for CPAP titration study. Of note, the absence of REM sleep with most likely have underestimated her obstructive sleep apnea. Her CPAP titration study from 01/08/2015 showed a sleep efficiency of 93.6%, latency to sleep was 0 minutes and wake after sleep onset was 33.5 minutes with mild sleep fragmentation noted. She had a normal arousal index. She had a absence of slow-wave sleep and a normal percentage of REM sleep with a long REM latency. She had no significant PLMS. Average oxygen saturation was 92%, nadir was 84%. She did have evidence of central apneas upon starting with CPAP. CPAP was titrated from 5-11 cm. I prescribed CPAP therapy for home use. I did suggest that she should have a pulse oximetry test while on CPAP once established on treatment.   I reviewed her CPAP compliance data from 02/23/2015 through 03/24/2015 which is a total of 30 days during which time she used her machine 25 days with percent used days greater than 4 hours at 77%, indicating good compliance with an average usage of 6 hours and 11 minutes. Residual AHI acceptable at 3.3 per hour and 95th percentile of leak at 12 L/m on a pressure of 11 cm with EPR of 3.   I reviewed her CPAP compliance data from 02/12/2015 through 03/13/2015 which is a total of 30 days during which time she used her machine only 22 days with percent used days greater than 4 hours at 73%, indicating adequate compliance with an average usage of 5 hours and 41 minutes. Residual AHI acceptable  at 3.4 per hour and 95th percentile of leak at 14.7 L/m on a pressure of 11 cm with EPR of 3.   She goes to [facility] for her mood d/o. She is on multiple sedating  medications, but has been told she snores. She has snored for 4 years or so. She does not wake up rested. She wakes up with a headache. She denies nocturia. She has no FHx of OSA. She has had intermittent RLS symptoms and twitches her legs in her sleep. She has dozed off driving more than once and veered out of the lane, but thankfully has had no MVA.   She drinks 2 cups of coffee per day. She does not smoke and very rarely drinks alcohol.   She has 2 cats, who occasionally are in the bed, but don't bother her. She does not watch TV in bed. She mostly sleeps alone. She is divorced and has no children.    Her typical bedtime is reported to be around 10 PM and usual wake time is around 7 AM. Sleep onset typically occurs within minutes. She reports feeling poorly rested upon awakening. She wakes up on an average 3 times in the middle of the night and has to go to the bathroom 0 to 1 times on a typical night.    She reports excessive daytime somnolence (EDS) and Her Epworth Sleepiness Score (ESS) is 9/24 today.   The patient has not been taking a planned nap.   Snoring is reportedly marked, and associated with choking sounds and witnessed apneas. The patient denies a sense of choking or strangling feeling. The patient has noted any RLS symptoms and is known to kick while asleep or before falling asleep. There is no family history of RLS or OSA.   She is a restless sleeper and in the morning, the bed is quite disheveled.    She denies cataplexy, sleep paralysis, hypnagogic or hypnopompic hallucinations, or sleep attacks. She does not report any vivid dreams, nightmares, dream enactments, or parasomnias, such as sleep talking or sleep walking. The patient has not had a sleep study or a home sleep test. Her bedroom is usually dark and cool. She uses a box fan.       Her Past Medical History Is Significant For: Past Medical History:  Diagnosis Date   Allergic rhinitis    Allergy    Anxiety    Arthritis     R knee, Left Knee   Asthma    Bipolar disorder    Cataract    Complication of anesthesia    had bad anxiety after anesthesia   Constipation, chronic    Depression    Diabetes mellitus without complication    Dysrhythmia    Palpations occ, takes Tenormin   GERD (gastroesophageal reflux disease)    Glaucoma    Heart murmur    Hx of adenomatous colonic polyps 2008   Hyperlipidemia    Hypertension    MVP (mitral valve prolapse)    Sleep apnea    wears CPAP   Urinary tract infection    frequent, see Urololgist 12/16/2010    Her Past Surgical History Is Significant For: Past Surgical History:  Procedure Laterality Date   ABDOMINAL HYSTERECTOMY  2002   no oophorectomy   BACK SURGERY  2001   L5 - S1   CATARACT EXTRACTION     COLONOSCOPY     NASAL SINUS SURGERY  01/2010   x2   TONSILLECTOMY  TOTAL KNEE ARTHROPLASTY  12/23/2011   Procedure: TOTAL KNEE ARTHROPLASTY;  Surgeon: Ninetta Lights, MD;  Location: [facility];  Service: Orthopedics;  Laterality: Right;  DR MURPHY WANTS 120 MINUTES FOR SURGERY   TOTAL KNEE ARTHROPLASTY  08/31/2012   Procedure: TOTAL KNEE ARTHROPLASTY;  Surgeon: Ninetta Lights, MD;  Location: [facility];  Service: Orthopedics;  Laterality: Left;    Her Family History Is Significant For: Family History  Problem Relation Age of Onset   Lung disease Mother        Mycobacterium avium intracellulare   Coronary artery disease Father    Heart attack Father 71   Heart disease Father    Hypertension Brother    Hyperlipidemia Brother    Alcohol abuse Brother    Anxiety disorder Brother    Healthy Brother    Diabetes Maternal Grandmother    Stroke Maternal Grandmother    Stroke Maternal Grandfather    Heart attack Paternal Grandmother    Anesthesia problems Neg Hx    Colon cancer Neg Hx    Esophageal cancer Neg Hx    Rectal cancer Neg Hx    Stomach cancer Neg Hx     Her Social History Is Significant For: Social History   Socioeconomic History   Marital  status: Single    Spouse name: Not on file   Number of children: 0   Years of education: 13   Highest education level: Some college, no degree  Occupational History   Occupation: retired//disability    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 No kids. Grew up in [place]. Had a great childhood. Has 2 older brothers.   Mom was a stay at home mom. Her dad worked at [employer] as a Banker. He died when pt was 67yo.    Never abused.       Disabled d/t Bipolar since 1992.   Religion-Christian   No legal issues.    Lives alone. Has 1 dog. Has pet sitting business.   2 cups of coffee a day.   Social Determinants of Health   Financial Resource Strain: Low Risk  (06/30/2022)   Overall Financial Resource Strain (CARDIA)    Difficulty of Paying Living Expenses: Not hard at all  Food Insecurity: No Food Insecurity (06/30/2022)   Hunger Vital Sign    Worried About Running Out of Food in the Last Year: Never true    Ran Out of Food in the Last Year: Never true  Transportation Needs: No Transportation Needs (06/30/2022)   PRAPARE - Hydrologist (Medical): No    Lack of Transportation (Non-Medical): No  Physical Activity: Sufficiently Active (06/30/2022)   Exercise Vital Sign    Days of Exercise per Week: 5 days    Minutes of Exercise per Session: 30 min  Stress: No Stress Concern Present (06/30/2022)   [place]    Feeling of Stress :  Only a little  Social  Connections: Socially Isolated (06/30/2022)   Social Connection and Isolation Panel [NHANES]    Frequency of Communication with Friends and Family: Three times a week    Frequency of Social Gatherings with Friends and Family: Three times a week    Attends Religious Services: Never    Active Member of Clubs or Organizations: No    Attends Archivist Meetings: Never    Marital Status: Divorced    Her Allergies Are:  Allergies  Allergen Reactions   Nitrofurantoin Monohyd Macro Nausea Only    Severe headache   Statins Other (See Comments)    Severe weakness, pain   Macrobid [Nitrofurantoin]     Make her feel crazy   Alprazolam Anxiety and Other (See Comments)    Hyper    Amoxicillin Rash   Prednisone Anxiety   Sulfa Antibiotics Nausea And Vomiting  :   Her Current Medications Are:  Outpatient Encounter Medications as of 02/08/2023  Medication Sig   azelastine (ASTELIN) 0.1 % nasal spray PLACE 2 SPRAYS INTO EACH NOSTRIL TWO TIMES A DAY AS DIRECTED   blood glucose meter kit and supplies KIT Dispense based on patient and insurance preference. Use up to four times daily as directed.   Cholecalciferol (VITAMIN D3) 50 MCG (2000 UT) CAPS Take 2,000 Units by mouth daily.   Cholecalciferol 1.25 MG (50000 UT) TABS Take 1 tablet by mouth once a week.   CLARAVIS 20 MG capsule Take 20 mg by mouth daily.   clonazePAM (KLONOPIN) 2 MG tablet 1 mg 2 (two) times daily.    Cyanocobalamin (VITAMIN B-12 PO) Take by mouth.   empagliflozin (JARDIANCE) 10 MG TABS tablet Take 1 tablet (10 mg total) by mouth daily before breakfast.   empagliflozin (JARDIANCE) 10 MG TABS tablet Take 1 tablet (10 mg total) by mouth daily before breakfast.   fenofibrate (TRICOR) 145 MG tablet Take 1 tablet (145 mg total) by mouth daily.   fexofenadine (ALLEGRA) 180 MG tablet Take 1 tablet (180 mg total) by mouth daily.   fluticasone (FLONASE) 50 MCG/ACT nasal spray SPRAY TWO SPRAYS IN EACH NOSTRIL ONCE DAILY    gabapentin (NEURONTIN) 600 MG tablet Take 1,200 mg by mouth 2 (two) times daily.    glucose blood test strip Use as instructed   lamoTRIgine (LAMICTAL) 200 MG tablet Take 200 mg by mouth 2 (two) times daily.   Lancets (ONETOUCH DELICA PLUS Q000111Q) MISC Use twice daily to test blood sugars as directed Dx- uncontrolled DM II   losartan (COZAAR) 50 MG tablet Take 1 tablet (50 mg total) by mouth daily.   LUMIGAN 0.01 % SOLN Place 1 drop into both eyes at bedtime.    meloxicam (MOBIC) 15 MG tablet Take 1 tablet (15 mg total) by mouth daily.   metoprolol succinate (TOPROL-XL) 50 MG 24 hr tablet TAKE ONE TABLET BY MOUTH DAILY WITH A MEAL   montelukast (SINGULAIR) 10 MG tablet Take 1 tablet (10 mg total) by mouth at bedtime.   Multiple Vitamin (MULTIVITAMIN WITH MINERALS) TABS tablet Take 1 tablet by mouth daily.   OLANZapine (ZYPREXA) 5 MG tablet Take 5 mg by mouth daily as needed.   omega-3 acid ethyl esters (LOVAZA) 1 g capsule TAKE TWO CAPSULES BY MOUTH TWICE A DAY   PRALUENT 150 MG/ML SOAJ DIAL AND INJECT 150MG  INTO THE SKIN ONCE EVERY 14 DAYS   Pyridoxine HCl (VITAMIN B-6 PO) Take 1 tablet by mouth daily.   QUEtiapine (SEROQUEL XR) 400  MG 24 hr tablet Take 400 mg by mouth at bedtime.   QUEtiapine (SEROQUEL) 50 MG tablet Take 50 mg by mouth daily.   timolol (TIMOPTIC) 0.5 % ophthalmic solution SMARTSIG:In Eye(s)   venlafaxine (EFFEXOR-XR) 75 MG 24 hr capsule Take 75 mg by mouth. Take 2 tabs in the am and 1 tab in afternoonn   diazepam (VALIUM) 5 MG tablet Take 1 tab prior to MRI and take 2nd tab if needed after arrival (Patient not taking: Reported on 02/08/2023)   SUMAtriptan (IMITREX) 50 MG tablet Take 1 tablet (50 mg total) by mouth once for 1 dose. May repeat in 2 hours if headache persists or recurs.   [DISCONTINUED] ALPHAGAN P 0.1 % SOLN Place 1 drop into both eyes every 8 (eight) hours.  (Patient not taking: Reported on 02/08/2023)   [DISCONTINUED] busPIRone (BUSPAR) 7.5 MG tablet Take 1  tablet (7.5 mg total) by mouth 2 (two) times daily. (Patient not taking: Reported on 02/08/2023)   [DISCONTINUED] nitrofurantoin, macrocrystal-monohydrate, (MACROBID) 100 MG capsule Take 100 mg by mouth 2 (two) times daily. (Patient not taking: Reported on 02/08/2023)   No facility-administered encounter medications on file as of 02/08/2023.  :   Review of Systems:  Out of a complete 14 point review of systems, all are reviewed and negative with the exception of these symptoms as listed below:   Review of Systems  Neurological:        Unsteady gait. (Started one month, got better over time, scheduled to have MRI in future).      Objective:  Neurological Exam  Physical Exam Physical Examination:   Vitals:   02/08/23 1417  BP: 96/64  Pulse: 69    General Examination: The patient is a very pleasant 67 y.o. female in no acute distress. She appears well-developed and well-nourished and well groomed.   HEENT: Normocephalic, atraumatic, pupils are equal, round and reactive to light, extraocular tracking is well-preserved, hearing grossly intact to tuning fork.  Face is symmetric with normal facial animation and normal facial sensation to light touch, temperature and vibration sense.  Speech is clear with no dysarthria noted. There is no hypophonia. There is no lip, neck/head, jaw or voice tremor. Neck is supple with full range of passive and active motion. There are no carotid bruits on auscultation. Oropharynx exam reveals: Moderate mouth dryness, adequate dental hygiene and mild airway crowding. Tongue protrudes centrally and palate elevates symmetrically.    Chest: Clear to auscultation without wheezing, rhonchi or crackles noted.  Heart: S1+S2+0, regular and normal without murmurs, rubs or gallops noted.    Abdomen: Soft, non-tender and non-distended.   Extremities: There is no obvious  edema in the distal lower extremities bilaterally.    Skin: Warm and dry without trophic changes  noted.     Musculoskeletal: exam reveals no obvious joint deformities.    Neurologically:  Mental status: The patient is awake, alert and oriented in all 4 spheres. Her immediate and remote memory, attention, language skills and fund of knowledge are appropriate. There is no evidence of aphasia, agnosia, apraxia or anomia. Speech is clear with normal prosody and enunciation. Thought process is linear. Mood is normal and affect is normal.  Cranial nerves II - XII are as described above under HEENT exam.  Motor exam: Normal bulk, strength and tone is noted. There is no drift, resting or action tremor.  Fine motor skills and coordination grossly intact in the upper and lower extremities.  Cerebellar testing: No dysmetria or intention  tremor.  No gait ataxia. Sensory exam: intact to light touch in the upper and lower extremities. Gait, station and balance: She stands easily. No veering to one side is noted. No leaning to one side is noted. Posture is age-appropriate and stance is narrow based. Gait shows normal stride length and normal pace.     Assessment and Plan:    In summary, Arin Minaya is a 67 year old female with an underlying medical history of mood disorder, including bipolar disease obesity, mitral valve prolapse, hyperlipidemia, allergic rhinitis, palpitations, constipation, heart murmur, chronic cough, reflux disease, anxiety, and low back pain status post back surgery in 2001, arthritis, status post left total knee arthroplasty and right total knee arthroplasty, tonsillectomy, and hysterectomy, status post nasal surgeries, obesity, obstructive sleep apnea on CPAP therapy, who presents for evaluation of her balance problem.  She had trouble with her balance several months ago but has since then improved, exam is nonfocal and largely reassuring.  She is advised that her balance could be off secondary to several factors including certain medication side effects, not sleeping well, with  undertreated obstructive sleep apnea in particular.  We talked about the importance of staying well-hydrated and she is advised to be fully consistent with her CPAP machine.   She has a history of Chiari malformation and her last brain MRI from January 2022 had shown some progression.  She is currently scheduled for brain MRI later this month.  She had seen neurosurgery and had a cervical spine MRI as well.  She is advised to follow-up routinely in 1 year to see one of our nurse practitioners for sleep apnea checkup.   I answered all her questions today and she was in agreement.  Thank you very much for allowing me to participate in the care of this nice patient. If I can be of any further assistance to you please do not hesitate to call me at [phone].   Sincerely,     Star Age, MD, PhD  I spent 40 minutes in total face-to-face time and in reviewing records during pre-charting, more than 50% of which was spent in counseling and coordination of care, reviewing test results, reviewing medications and treatment regimen and/or in discussing or reviewing the diagnosis of balance problem, OSA on CPAP, the prognosis and treatment options. Pertinent laboratory and imaging test results that were available during this visit with the patient were reviewed by me and considered in my medical decision making (see chart for details).

## 2023-02-08 NOTE — Patient Instructions (Signed)
It was nice to see you again today.  You are at risk for balance issues primarily secondary to medication side effects and balance can get worse as we age.  Please try to stay well-hydrated and change positions slowly.  Please try to be consistent with your CPAP as well.  Follow-up to see Jinny Blossom in 1 year routinely.

## 2023-02-15 ENCOUNTER — Other Ambulatory Visit: Payer: Medicare Other

## 2023-02-17 ENCOUNTER — Other Ambulatory Visit: Payer: Self-pay | Admitting: Interventional Cardiology

## 2023-02-26 ENCOUNTER — Ambulatory Visit: Payer: Medicare Other | Admitting: Family Medicine

## 2023-02-26 ENCOUNTER — Encounter: Payer: Self-pay | Admitting: Family Medicine

## 2023-02-26 VITALS — BP 124/76 | HR 62 | Temp 97.6°F | Ht 69.0 in | Wt 183.6 lb

## 2023-02-26 DIAGNOSIS — E119 Type 2 diabetes mellitus without complications: Secondary | ICD-10-CM

## 2023-02-26 LAB — BASIC METABOLIC PANEL
BUN: 21 mg/dL (ref 6–23)
CO2: 27 mEq/L (ref 19–32)
Calcium: 9.5 mg/dL (ref 8.4–10.5)
Chloride: 99 mEq/L (ref 96–112)
Creatinine, Ser: 0.78 mg/dL (ref 0.40–1.20)
GFR: 79.64 mL/min (ref >60.00)
Glucose, Bld: 82 mg/dL (ref 70–99)
Potassium: 4.6 mEq/L (ref 3.5–5.1)
Sodium: 132 mEq/L — ABNORMAL LOW (ref 135–145)

## 2023-02-26 LAB — HEMOGLOBIN A1C: Hgb A1c MFr Bld: 5.8 % (ref 4.6–6.5)

## 2023-02-26 NOTE — Patient Instructions (Signed)
Schedule your complete physical in 3-4 months We'll notify you of your lab results and make any changes if needed You did exactly the right thing!  You recognized your symptoms, you checked your sugar, and you ate! Call with any questions or concerns I'm PROUD of you!!!

## 2023-02-26 NOTE — Assessment & Plan Note (Signed)
Pt's last A1C was excellent at 6.0%  On Jardiance  daily.  Had symptomatic low yesterday and she can't remember if she ate lunch or not.  Told her it was encouraging that she was able to recognize the signs and knew what to do.  We reviewed sxs of hypoglycemia and what snacks she should always have on hand.  Pt felt better after our discussion.  Check labs.  Adjust meds prn

## 2023-02-26 NOTE — Progress Notes (Signed)
   Subjective:    Patient ID: [name], female    DOB: 1956/04/10, 67 y.o.   MRN: [id number]  HPI DM- chronic problem, on Jardiance  daily.  Last A1C 6%  She reports she had 'a scare yesterday'.  Pt reports she started sweating yesterday afternoon and when she checked her sugar it was 75.  She took glucose tablets and 'it jacked it up'.  She knows she ate breakfast yesterday and thinks she ate lunch but isn't sure.  Pt is fearful of exercise.     Review of Systems For ROS see HPI     Objective:   Physical Exam Vitals reviewed.  Constitutional:      General: She is not in acute distress.    Appearance: Normal appearance. She is well-developed. She is not ill-appearing.  HENT:     Head: Normocephalic and atraumatic.  Eyes:     Conjunctiva/sclera: Conjunctivae normal.     Pupils: Pupils are equal, round, and reactive to light.  Neck:     Thyroid: No thyromegaly.  Cardiovascular:     Rate and Rhythm: Normal rate and regular rhythm.     Pulses: Normal pulses.     Heart sounds: Normal heart sounds. No murmur heard. Pulmonary:     Effort: Pulmonary effort is normal. No respiratory distress.     Breath sounds: Normal breath sounds.  Abdominal:     General: There is no distension.     Palpations: Abdomen is soft.     Tenderness: There is no abdominal tenderness.  Musculoskeletal:     Cervical back: Normal range of motion and neck supple.     Right lower leg: No edema.     Left lower leg: No edema.  Lymphadenopathy:     Cervical: No cervical adenopathy.  Skin:    General: Skin is warm and dry.  Neurological:     General: No focal deficit present.     Mental Status: She is alert and oriented to person, place, and time.  Psychiatric:        Mood and Affect: Mood normal.        Behavior: Behavior normal.        Thought Content: Thought content normal.           Assessment & Plan:

## 2023-02-28 ENCOUNTER — Other Ambulatory Visit: Payer: Medicare Other

## 2023-03-01 ENCOUNTER — Telehealth: Payer: Self-pay

## 2023-03-01 NOTE — Telephone Encounter (Signed)
-----   Message from Sheliah Hatch, MD sent at 03/01/2023  7:16 AM EDT ----- Labs look great!  A1C is fantastic at 5.8%.  Glucose is 82- excellent!  Just make sure you are eating regularly.  Make sure you are drinking around 50-60oz of fluids daily.  No changes at this time

## 2023-03-01 NOTE — Telephone Encounter (Signed)
Pt seen results Via my chart  

## 2023-03-11 ENCOUNTER — Encounter: Payer: Medicare Other | Attending: Family Medicine | Admitting: Dietician

## 2023-03-11 ENCOUNTER — Ambulatory Visit: Payer: Medicare HMO | Admitting: Adult Health

## 2023-03-11 ENCOUNTER — Encounter: Payer: Self-pay | Admitting: Dietician

## 2023-03-11 VITALS — Wt 181.0 lb

## 2023-03-11 DIAGNOSIS — E119 Type 2 diabetes mellitus without complications: Secondary | ICD-10-CM

## 2023-03-11 NOTE — Progress Notes (Signed)
Diabetes Self-Management Education  Visit Type: Follow-upType 2 Diabetes  Appt. Start Time: 0930 Appt. End Time: 1010  03/11/2023  [name], identified by name and date of birth, is a 67 y.o. female with a diagnosis of Diabetes:  .   ASSESSMENT Patient is here today alone.  She was last seen by this RD on 12/17/2022.  Patient states that she is not feeling well.  She is trying to control her blood glucose.  Some low blood glucose symptoms at 88 and felt unwell yesterday after a walk. Walked 2 miles yesterday.  Increased anxiety and sweating which gives her concern of her mental health control. Fasting blood glucose 109 today.   She is eating very well.  May need to increase her carbohydrate for how active she is. She has lost 19 lbs in the past   3 months.  History includes:  Type 2 Diabetes, HTN, HLD, GERD, OSA on c-pap, constipation, GERD, bipolar. A1C 5.8% 02/26/2023, 6% 10/14/2023, 8.2% 04/14/2022 decreased, eGFR 74 (05/04/2022), GFR 92, cholesterol 148, HDL 66, LDL 44, Triglycerides 189, vitamin D 20 on 04/09/21 Medications includes vitamin D, vitamin B-12, Jardiance   Weight hx: 181 lbs 03/11/2023 200 lbs 12/17/2022 194 lbs 09/11/2022 201 lbs 06/08/2022 218 lbs 04/14/2022 219 lbs 06/23/2021 220 lbs 04/21/2021  222 lbs 04/07/2021 231 lbs January 2019 193 lbs 06/2018 207 lbs 11/22/2018   Patient lives with her dog.  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  She worked for [employer] prior to being downsized. She struggles with finances at times. She struggles with standing for long periods of time.   She has gone to a therapist but has not gone for a while.  She states that it helps when she talks with her cousin.  Being alone a lot causes her stress.  She states that she needs more structure. She enjoys playing guitar and singing.  She writes her own songs  Weight 181 lb (82.1 kg), last menstrual period 11/09/2000. Body mass index is 26.73 kg/m.   Diabetes Self-Management  Education - 03/11/23 1000       Visit Information   Visit Type Follow-up      Psychosocial Assessment   Patient Belief/Attitude about Diabetes Defeat/Burnout    What is the hardest part about your diabetes right now, causing you the most concern, or is the most worrisome to you about your diabetes?   Getting support / problem solving    Self-care barriers Other (comment)   stress/bipolar   Self-management support Doctor's office;CDE visits    Other persons present Patient    Patient Concerns Nutrition/Meal planning    Special Needs None    Preferred Learning Style No preference indicated    Learning Readiness Ready    How often do you need to have someone help you when you read instructions, pamphlets, or other written materials from your doctor or pharmacy? 1 - Never      Pre-Education Assessment   Patient understands the diabetes disease and treatment process. Comprehends key points    Patient understands incorporating nutritional management into lifestyle. Comprehends key points    Patient undertands incorporating physical activity into lifestyle. Comprehends key points    Patient understands using medications safely. Comprehends key points    Patient understands monitoring blood glucose, interpreting and using results Comprehends key points    Patient understands prevention, detection, and treatment of acute complications. Comprehends key points    Patient understands prevention, detection, and treatment of chronic  complications. Compreheands key points    Patient understands how to develop strategies to address psychosocial issues. Needs Review    Patient understands how to develop strategies to promote health/change behavior. Needs Review      Complications   Last HgB A1C per patient/outside source 5.8 %   02/26/2023   How often do you check your blood sugar? 1-2 times/day    Fasting Blood glucose range (mg/dL) 16-109    Number of hypoglycemic episodes per month 0    Number of  hyperglycemic episodes ( >200mg /dL): Rare      Dietary Intake   Breakfast eggs, spinach, cheese, oatmeal with berries    Snack (morning) nuts, cottage cheese    Lunch tyrkey sanwich with mayo, spinach, avocado on [employer] bread    Snack (afternoon) yes    Dinner [place] burger patty, 1/2 cup brown rice, 1 cup vegetables, fruit    Snack (evening) crackers and PB OR celery and PB    Beverage(s) water, sparkling water, occasional coffee with almond milk      Activity / Exercise   Activity / Exercise Type Light (walking / raking leaves)    How many days per week do you exercise? 5    How many minutes per day do you exercise? 30    Total minutes per week of exercise 150      Patient Education   Previous Diabetes Education Yes (please comment)   12/2022   Healthy Eating Meal options for control of blood glucose level and chronic complications.    Being Active Role of exercise on diabetes management, blood pressure control and cardiac health.    Medications Reviewed patients medication for diabetes, action, purpose, timing of dose and side effects.    Monitoring Identified appropriate SMBG and/or A1C goals.    Acute complications Taught prevention, symptoms, and  treatment of hypoglycemia - the 15 rule.    Diabetes Stress and Support Identified and addressed patients feelings and concerns about diabetes;Worked with patient to identify barriers to care and solutions    Lifestyle and Health Coping Lifestyle issues that need to be addressed for better diabetes care      Individualized Goals (developed by patient)   Nutrition General guidelines for healthy choices and portions discussed    Physical Activity Exercise 5-7 days per week    Medications take my medication as prescribed    Monitoring  Test my blood glucose as discussed    Problem Solving Eating Pattern    Reducing Risk examine blood glucose patterns    Health Coping Ask for help with psychological, social, or emotional issues      Patient  Self-Evaluation of Goals - Patient rates self as meeting previously set goals (% of time)   Nutrition >75% (most of the time)    Physical Activity >75% (most of the time)    Medications >75% (most of the time)    Monitoring >75% (most of the time)    Problem Solving and behavior change strategies  >75% (most of the time)    Reducing Risk (treating acute and chronic complications) >75% (most of the time)    Health Coping 50 - 75 % (half of the time)      Post-Education Assessment   Patient understands the diabetes disease and treatment process. Demonstrates understanding / competency    Patient understands incorporating nutritional management into lifestyle. Demonstrates understanding / competency    Patient undertands incorporating physical activity into lifestyle. Demonstrates understanding / competency  Patient understands using medications safely. Demonstrates understanding / competency    Patient understands monitoring blood glucose, interpreting and using results Demonstrates understanding / competency    Patient understands prevention, detection, and treatment of acute complications. Demonstrates understanding / competency    Patient understands prevention, detection, and treatment of chronic complications. Demonstrates understanding / competency    Patient understands how to develop strategies to address psychosocial issues. Comprehends key points    Patient understands how to develop strategies to promote health/change behavior. Comprehends key points      Outcomes   Expected Outcomes Demonstrated interest in learning. Expect positive outcomes    Future DMSE 4-6 wks    Program Status Not Completed      Subsequent Visit   Since your last visit have you continued or begun to take your medications as prescribed? Yes    Since your last visit have you experienced any weight changes? Loss    Weight Loss (lbs) 19    Since your last visit, are you checking your blood glucose at least  once a day? Yes             Individualized Plan for Diabetes Self-Management Training:   Learning Objective:  Patient will have a greater understanding of diabetes self-management. Patient education plan is to attend individual and/or group sessions per assessed needs and concerns.   Plan:   Patient Instructions  Consider a Continuous Glucose Monitor.  You do not need a receiver as your phone is compatible.  Come to see me for training if desired.   FreeStyle LIbre 3  Dexcom G7  Plan:  Aim for 3 Carb Choices per meal (45 grams) +/- 1 either way  Aim for 0-1 Carbs per snack if hungry  Include protein in moderation with your meals and snacks Consider reading food labels for Total Carbohydrate of foods Consider  increasing your activity level by walking for 30 minutes daily as tolerated. Consider weights and core exercises for seniors. Consider checking BG at alternate times per day  Continue taking medication  as directed by MD     Expected Outcomes:  Demonstrated interest in learning. Expect positive outcomes  Education material provided: Meal plan card, My Plate, and Snack sheet  If problems or questions, patient to contact team via:  Phone  Future DSME appointment: 4-6 wks

## 2023-03-11 NOTE — Patient Instructions (Addendum)
Consider a Continuous Glucose Monitor.  You do not need a receiver as your phone is compatible.  Come to see me for training if desired.   FreeStyle LIbre 3  Dexcom G7  Plan:  Aim for 3 Carb Choices per meal (45 grams) +/- 1 either way  Aim for 0-1 Carbs per snack if hungry  Include protein in moderation with your meals and snacks Consider reading food labels for Total Carbohydrate of foods Consider  increasing your activity level by walking for 30 minutes daily as tolerated. Consider weights and core exercises for seniors. Consider checking BG at alternate times per day  Continue taking medication  as directed by MD

## 2023-03-16 ENCOUNTER — Encounter: Payer: Self-pay | Admitting: Family Medicine

## 2023-03-16 ENCOUNTER — Ambulatory Visit: Payer: Medicare Other | Admitting: Family Medicine

## 2023-03-16 ENCOUNTER — Other Ambulatory Visit: Payer: Self-pay

## 2023-03-16 VITALS — BP 108/62 | HR 65 | Temp 97.8°F | Resp 17 | Ht 69.0 in | Wt 181.1 lb

## 2023-03-16 DIAGNOSIS — E119 Type 2 diabetes mellitus without complications: Secondary | ICD-10-CM

## 2023-03-16 DIAGNOSIS — Z7984 Long term (current) use of oral hypoglycemic drugs: Secondary | ICD-10-CM

## 2023-03-16 MED ORDER — ONETOUCH DELICA PLUS LANCET30G MISC: 1 refills | Status: DC

## 2023-03-16 NOTE — Progress Notes (Signed)
   Subjective:    Patient ID: [name], female    DOB: 12-Aug-1956, 67 y.o.   MRN: [id number]  HPI Diabetes- chronic problem.  Currently on Jardiance 10mg  daily.  A1C last month 5.8%  Saw nutrition on 5/2 and was told that she wasn't eating enough carbs for her activity level.  Since she has increased her intake she is feeling better.  Pt is walking daily and notes some fatigue.  Fasting sugar today was 109.  Had 2 boiled eggs and oatmeal w/ berries and it was 157.  Pt feels that sugar is jumping too high.  She is also scared to drive b/c she worries sugar will drop.   Review of Systems For ROS see HPI     Objective:   Physical Exam Vitals reviewed.  Constitutional:      General: She is not in acute distress.    Appearance: Normal appearance. She is well-developed. She is not ill-appearing.  HENT:     Head: Normocephalic and atraumatic.  Eyes:     Conjunctiva/sclera: Conjunctivae normal.     Pupils: Pupils are equal, round, and reactive to light.  Neck:     Thyroid: No thyromegaly.  Cardiovascular:     Rate and Rhythm: Normal rate and regular rhythm.     Heart sounds: Normal heart sounds. No murmur heard. Pulmonary:     Effort: Pulmonary effort is normal. No respiratory distress.     Breath sounds: Normal breath sounds.  Abdominal:     General: There is no distension.     Palpations: Abdomen is soft.     Tenderness: There is no abdominal tenderness.  Musculoskeletal:     Cervical back: Normal range of motion and neck supple.  Lymphadenopathy:     Cervical: No cervical adenopathy.  Skin:    General: Skin is warm and dry.  Neurological:     Mental Status: She is alert and oriented to person, place, and time.  Psychiatric:        Behavior: Behavior normal.     Comments: anxious           Assessment & Plan:

## 2023-03-16 NOTE — Patient Instructions (Signed)
Schedule your complete physical in 3-4 months Do NOT worry about any sugar under 200 Make sure you are eating regularly and eating enough to support your daily activities You know what to do if your sugar drops low.  Do not be afraid to live your life! Call with any questions or concerns Hang in there!  You're doing great!

## 2023-03-17 ENCOUNTER — Ambulatory Visit
Admission: RE | Admit: 2023-03-17 | Discharge: 2023-03-17 | Disposition: A | Discharge status: Home / Self Care | Payer: Medicare Other | Source: Ambulatory Visit | Attending: Family Medicine | Admitting: Family Medicine

## 2023-03-17 DIAGNOSIS — R42 Dizziness and giddiness: Secondary | ICD-10-CM

## 2023-03-17 DIAGNOSIS — R29898 Other symptoms and signs involving the musculoskeletal system: Secondary | ICD-10-CM

## 2023-03-17 DIAGNOSIS — G935 Compression of brain: Secondary | ICD-10-CM

## 2023-03-17 MED ORDER — GADOPICLENOL 0.5 MMOL/ML IV SOLN
10.0000 mL | Freq: Once | INTRAVENOUS | Status: AC | PRN
  Administered 2023-03-17: 10 mL via INTRAVENOUS

## 2023-03-25 ENCOUNTER — Ambulatory Visit (INDEPENDENT_AMBULATORY_CARE_PROVIDER_SITE_OTHER): Payer: Medicare Other | Admitting: Family Medicine

## 2023-03-25 ENCOUNTER — Encounter: Payer: Self-pay | Admitting: Family Medicine

## 2023-03-25 VITALS — BP 116/68 | HR 65 | Temp 99.0°F | Resp 18 | Ht 69.0 in | Wt 184.5 lb

## 2023-03-25 DIAGNOSIS — R197 Diarrhea, unspecified: Secondary | ICD-10-CM | POA: Diagnosis not present

## 2023-03-25 NOTE — Progress Notes (Signed)
   Subjective:    Patient ID: [name], female    DOB: 1955/12/06, 67 y.o.   MRN: [id number]  HPI Diarrhea- 'i feel like I have a virus'.  Sxs started Tuesday and are improving.  No vomiting.  Mild nausea.  Denies abd pain.  Stools are watery.  Last liquid stool was yesterday, 'not much'.  No BM yet today.  Nausea is improving.  Stomach cramping has resolved.   Review of Systems For ROS see HPI     Objective:   Physical Exam Vitals reviewed.  Constitutional:      General: She is not in acute distress.    Appearance: Normal appearance. She is not ill-appearing.  HENT:     Head: Normocephalic and atraumatic.  Cardiovascular:     Rate and Rhythm: Normal rate.  Pulmonary:     Effort: Pulmonary effort is normal. No respiratory distress.  Abdominal:     General: Bowel sounds are normal. There is no distension.     Palpations: Abdomen is soft.     Tenderness: There is no abdominal tenderness. There is no guarding or rebound.  Skin:    General: Skin is warm and dry.  Neurological:     General: No focal deficit present.     Mental Status: She is alert and oriented to person, place, and time.  Psychiatric:        Mood and Affect: Mood normal.        Behavior: Behavior normal.        Thought Content: Thought content normal.           Assessment & Plan:  Diarrhea- new.  Pt reports sxs are resolving w/o intervention.  Most likely a viral illness that is running its course.  Encouraged hydration and regular eating since she takes diabetes medication.  Pt expressed understanding and is in agreement w/ plan.

## 2023-03-25 NOTE — Patient Instructions (Signed)
Follow up as needed or as scheduled This seems like a viral illness and will improve w/ time Make sure you are drinking LOTS of fluids and eating regularly Call with any questions or concerns Hang in there!!!

## 2023-03-28 ENCOUNTER — Other Ambulatory Visit: Payer: Self-pay | Admitting: Interventional Cardiology

## 2023-03-28 NOTE — Assessment & Plan Note (Signed)
Ongoing issue for pt.  This causes her a lot of anxiety.  She reports feeling better after increasing her caloric intake but still stresses about her sugars.  Reassured her that she had the tools to handle a drop in sugar and she did not need to worry about any sugar under 200.  This made her feel better.  Will continue to follow.

## 2023-04-19 ENCOUNTER — Encounter: Payer: Self-pay | Admitting: Family Medicine

## 2023-04-19 ENCOUNTER — Ambulatory Visit (INDEPENDENT_AMBULATORY_CARE_PROVIDER_SITE_OTHER): Payer: Medicare Other | Admitting: Family Medicine

## 2023-04-19 VITALS — BP 102/64 | HR 78 | Temp 98.8°F | Resp 17 | Ht 69.0 in | Wt 183.4 lb

## 2023-04-19 DIAGNOSIS — R202 Paresthesia of skin: Secondary | ICD-10-CM

## 2023-04-19 DIAGNOSIS — F419 Anxiety disorder, unspecified: Secondary | ICD-10-CM | POA: Diagnosis not present

## 2023-04-19 MED ORDER — ONETOUCH DELICA PLUS LANCET30G MISC: 1 refills | Status: DC

## 2023-04-19 MED ORDER — BUSPIRONE HCL 7.5 MG PO TABS: 7.5000 mg | ORAL_TABLET | Freq: Two times a day (BID) | ORAL | 3 refills | Status: DC

## 2023-04-19 NOTE — Progress Notes (Signed)
   Subjective:    Patient ID: [name], female    DOB: 27-May-1956, 67 y.o.   MRN: [id number]  HPI Pins and needles- 'yesterday I felt like I had pins sticking in me.  All over'.  Pt reports she was very stressed.  Is concerned that this could be diabetes.  Things are better today.  Pt states 'i'm afraid to go walking'.  Pt has been walking and everything has been fine.  Pt has to ask brother for money each month which is very difficult for her.  Also feels like she spends too much time alone.  Admits she is very anxious- particularly about her diabetes   Review of Systems For ROS see HPI     Objective:   Physical Exam Vitals reviewed.  Constitutional:      General: She is not in acute distress.    Appearance: Normal appearance. She is not ill-appearing.  HENT:     Head: Normocephalic and atraumatic.  Cardiovascular:     Rate and Rhythm: Normal rate and regular rhythm.  Pulmonary:     Effort: Pulmonary effort is normal. No respiratory distress.  Skin:    General: Skin is warm and dry.  Neurological:     General: No focal deficit present.     Mental Status: She is alert and oriented to person, place, and time.  Psychiatric:        Behavior: Behavior normal.     Comments: Anxious, has catastrophic thinking           Assessment & Plan:  Pins and needles- new.  Pt had whole body sensation yesterday which admittedly caused her to panic.  Suspect sxs started due to anxiety.  They resolved spontaneously after sleeping.  No further work up at this time.

## 2023-04-19 NOTE — Patient Instructions (Signed)
Follow up as needed or as scheduled RESTART the Buspar twice daily No other med changes at this time Keep up the good work- you are doing great!  Just trust yourself!! Call with any questions or concerns You've got this!!!

## 2023-04-19 NOTE — Assessment & Plan Note (Signed)
Deteriorated.  Pt has been struggling for awhile w/ worsening anxiety.  Her anxiety tends to revolve around her diabetes and she will very quickly start to panic that something is wrong.  Discussed that the pins and needles that she felt yesterday was not sudden onset neuropathy and since they have resolved after sleeping, it was most likely anxiety.  She agreed.  Sees psych but needs help w/ controlling her sxs.  Talked about walking herself through each 'what if' rather than letting fear take over.  Will restart Buspar as she feels she did better on this.  Will monitor.

## 2023-04-21 NOTE — Telephone Encounter (Signed)
Patient has called the office to inform us that she needs a refill on her Jardiance medication but she gets it through the [employer] [facility] and the refill number is [id number].   Please advise.  [employer] [facility]: [phone]

## 2023-04-23 ENCOUNTER — Ambulatory Visit: Payer: Medicare Other | Admitting: Dietician

## 2023-04-23 ENCOUNTER — Other Ambulatory Visit: Payer: Self-pay

## 2023-04-23 DIAGNOSIS — E119 Type 2 diabetes mellitus without complications: Secondary | ICD-10-CM

## 2023-04-23 MED ORDER — EMPAGLIFLOZIN 10 MG PO TABS: 10.0000 mg | ORAL_TABLET | Freq: Every day | ORAL | 3 refills | Status: DC

## 2023-04-30 ENCOUNTER — Telehealth: Payer: Self-pay | Admitting: Pharmacist

## 2023-04-30 ENCOUNTER — Other Ambulatory Visit: Payer: Self-pay | Admitting: Pharmacist

## 2023-04-30 ENCOUNTER — Ambulatory Visit: Payer: Medicare Other | Admitting: Family Medicine

## 2023-04-30 ENCOUNTER — Encounter: Payer: Self-pay | Admitting: Family Medicine

## 2023-04-30 VITALS — BP 126/84 | HR 81 | Temp 98.2°F | Resp 18 | Ht 69.0 in | Wt 184.0 lb

## 2023-04-30 DIAGNOSIS — R208 Other disturbances of skin sensation: Secondary | ICD-10-CM | POA: Diagnosis not present

## 2023-04-30 DIAGNOSIS — E119 Type 2 diabetes mellitus without complications: Secondary | ICD-10-CM

## 2023-04-30 DIAGNOSIS — E782 Mixed hyperlipidemia: Secondary | ICD-10-CM

## 2023-04-30 DIAGNOSIS — Z789 Other specified health status: Secondary | ICD-10-CM

## 2023-04-30 LAB — TSH: TSH: 1.11 u[IU]/mL (ref 0.35–5.50)

## 2023-04-30 LAB — CBC WITH DIFFERENTIAL/PLATELET
Basophils Absolute: 0 10*3/uL (ref 0.0–0.1)
Basophils Relative: 0.5 % (ref 0.0–3.0)
Eosinophils Absolute: 0 10*3/uL (ref 0.0–0.7)
Eosinophils Relative: 0.1 % (ref 0.0–5.0)
HCT: 34.2 % — ABNORMAL LOW (ref 36.0–46.0)
Hemoglobin: 11.5 g/dL — ABNORMAL LOW (ref 12.0–15.0)
Lymphocytes Relative: 33.4 % (ref 12.0–46.0)
Lymphs Abs: 1.2 10*3/uL (ref 0.7–4.0)
MCHC: 33.7 g/dL (ref 30.0–36.0)
MCV: 92.1 fl (ref 78.0–100.0)
Monocytes Absolute: 0.2 10*3/uL (ref 0.1–1.0)
Monocytes Relative: 6.7 % (ref 3.0–12.0)
Neutro Abs: 2.1 10*3/uL (ref 1.4–7.7)
Neutrophils Relative %: 59.3 % (ref 43.0–77.0)
Platelets: 223 10*3/uL (ref 150.0–400.0)
RBC: 3.72 Mil/uL — ABNORMAL LOW (ref 3.87–5.11)
RDW: 14.8 % (ref 11.5–15.5)
WBC: 3.5 10*3/uL — ABNORMAL LOW (ref 4.0–10.5)

## 2023-04-30 LAB — B12 AND FOLATE PANEL
Folate: 13.9 ng/mL (ref >5.9)
Vitamin B-12: 779 pg/mL (ref 211–911)

## 2023-04-30 NOTE — Telephone Encounter (Signed)
Opened in error

## 2023-04-30 NOTE — Patient Instructions (Signed)
Follow up as needed or as scheduled We'll notify you of your lab results and make any changes if needed I'll message Neurology and see if we can get you scheduled Call with any questions or concerns Hang in there!!!

## 2023-04-30 NOTE — Progress Notes (Signed)
   Subjective:    Patient ID: [name], female    DOB: [birth year], 67 y.o.   MRN: [id number]  HPI Burning feet- pt reports feet are 'just burning'.  At times she has a hard time distinguishing if feet are hot or cold.  Has some issue w/ cold hands but feet are more impacted.  Currently on Gabapentin 1200mg  BID but sxs are worsening despite medication.  Has DM but this has always been well controlled.  Sees neurology but has not had a neuropathy workup or nerve conduction study.   Review of Systems For ROS see HPI     Objective:   Physical Exam Vitals reviewed.  Constitutional:      General: She is not in acute distress.    Appearance: Normal appearance. She is not ill-appearing.  Cardiovascular:     Pulses: Normal pulses.  Musculoskeletal:     Right lower leg: No edema.     Left lower leg: No edema.  Skin:    General: Skin is warm and dry.     Findings: No erythema, lesion or rash.  Neurological:     General: No focal deficit present.     Mental Status: She is alert and oriented to person, place, and time.  Psychiatric:        Behavior: Behavior normal.        Thought Content: Thought content normal.     Comments: anxious           Assessment & Plan:   Burning sensation in feet- new.  Based on pt's description of burning this is most likely neuropathy.  Did discuss that there are various types of neuropathy and not all of them are due to poorly controlled diabetes.  Also talked about B12 and folate deficiencies as well as anemia can cause these sxs.  Will check labs.  Encouraged pt to f/u w/ neuro for complete work up.  Will send a note to Dr Frances Furbish.  Pt expressed understanding and is in agreement w/ plan.

## 2023-04-30 NOTE — Progress Notes (Signed)
Pharmacy Quality Measure Review  This patient is appearing on a report for being at risk of failing the adherence measure for diabetes medications this calendar year.   Medication: Jardiance 10mg  Last fill date: 03/09/2023 for 30 day supply  Spoke with patient today and she endorses that she has enrolled in [facility] medication assistance program. She will receive 90 DS of Jardiance next week at not cost.   She will likely fail MAD measure for 2024 since she is only taking Jardiance for Type 2 DM but she will be able to continue to take Jardiance with medication assistance program.   Noted that she also is taking Praulent. Patient report she also get Praluent from [employer] medication assistance program.  She is noted to be intolerant to statin medications:  Atorvastatin - patient cannot remember side effect Rosuvastatin - took 20mg  in 2017 and 40mg  in 2015 - stopped due to weakness  Simvastatin - took from 08/2014 to 09/2015 until she stopped due to leg / muscle pain and feeling sick/nauseated Pravastatin 06/2015 to 09/2015 - not sure why stopped - possibly did not get LDL to goal or muscle pain.  She also tried ezetimibe - caused GI upset; Niacin - stopped due to severe flushing; Repatha -stopped due to cost  For SUPD gap will need to code for statin myalgia for exclusion if appropriate.  Will follow up for future appointment with PCP to assess for exclusion.

## 2023-05-03 ENCOUNTER — Telehealth: Payer: Self-pay

## 2023-05-03 NOTE — Telephone Encounter (Signed)
-----   Message from Katherine E Tabori, MD sent at 05/01/2023  9:32 AM EDT ----- Labs look great!  No changes at this time 

## 2023-05-03 NOTE — Telephone Encounter (Signed)
Pt aware of lab results 

## 2023-05-07 ENCOUNTER — Ambulatory Visit: Payer: Medicare Other | Admitting: Family Medicine

## 2023-05-07 ENCOUNTER — Other Ambulatory Visit: Payer: Medicare Other

## 2023-05-07 ENCOUNTER — Encounter: Payer: Self-pay | Admitting: Family Medicine

## 2023-05-07 VITALS — BP 128/84 | HR 65 | Temp 98.3°F | Resp 17 | Ht 69.0 in | Wt 181.4 lb

## 2023-05-07 DIAGNOSIS — R300 Dysuria: Secondary | ICD-10-CM

## 2023-05-07 LAB — POCT URINALYSIS DIPSTICK
Glucose, UA: POSITIVE — AB
Ketones, UA: NEGATIVE
Protein, UA: NEGATIVE
Spec Grav, UA: 1.02 (ref 1.010–1.025)
Urobilinogen, UA: 0.2 E.U./dL
pH, UA: 6.5 (ref 5.0–8.0)

## 2023-05-07 MED ORDER — CIPROFLOXACIN HCL 500 MG PO TABS: 500.0000 mg | ORAL_TABLET | Freq: Two times a day (BID) | ORAL | 0 refills | Status: AC

## 2023-05-07 NOTE — Patient Instructions (Signed)
Follow up as needed or as scheduled START the Cipro twice daily- take w/ food Continue to drink LOTS of water Call with any questions or concerns Hang in there!!! 

## 2023-05-07 NOTE — Progress Notes (Signed)
   Subjective:    Patient ID: [name], female    DOB: 1956-01-27, 67 y.o.   MRN: [id number]  HPI Dysuria- pt reports she developed burning w/ urination yesterday.  Having some pelvic pain.  Denies change in frequency.  + urgency.  + fatigue.  No fevers.  Mild nausea, no vomiting.  Pt did a home UTI test yesterday that was +.  'it feels exactly like the last one'   Review of Systems For ROS see HPI     Objective:   Physical Exam Vitals reviewed.  Constitutional:      General: She is not in acute distress.    Appearance: Normal appearance. She is well-developed. She is not ill-appearing.  Abdominal:     General: There is no distension.     Palpations: Abdomen is soft.     Tenderness: There is abdominal tenderness (+ suprapubic TTP). There is no right CVA tenderness or left CVA tenderness.  Skin:    General: Skin is warm and dry.  Neurological:     General: No focal deficit present.     Mental Status: She is alert and oriented to person, place, and time.  Psychiatric:        Mood and Affect: Mood normal.        Behavior: Behavior normal.        Thought Content: Thought content normal.           Assessment & Plan:  Dysuria- new.  Sxs started yesterday.  Home UTI test was + and today's UA shows + leuks.  Will send urine for culture.  In the meantime, due to PCN and sulfa allergies, will start 3 day course of Cipro.  Reviewed supportive care and red flags that should prompt return.  Pt expressed understanding and is in agreement w/ plan.

## 2023-05-09 LAB — URINE CULTURE
MICRO NUMBER:: 15140835
SPECIMEN QUALITY:: ADEQUATE

## 2023-05-12 ENCOUNTER — Telehealth: Payer: Self-pay

## 2023-05-12 ENCOUNTER — Ambulatory Visit (INDEPENDENT_AMBULATORY_CARE_PROVIDER_SITE_OTHER): Payer: Medicare Other | Admitting: *Deleted

## 2023-05-12 DIAGNOSIS — Z0000 Encounter for general adult medical examination without abnormal findings: Secondary | ICD-10-CM | POA: Diagnosis not present

## 2023-05-12 NOTE — Telephone Encounter (Signed)
Pt has been informed ofl lab results

## 2023-05-12 NOTE — Patient Instructions (Signed)
[name] , Thank you for taking time to come for your Medicare Wellness Visit. I appreciate your ongoing commitment to your health goals. Please review the following plan we discussed and let me know if I can assist you in the future.   Screening recommendations/referrals: Colonoscopy: up to date Mammogram: up to date Bone Density: up to date Recommended yearly ophthalmology/optometry visit for glaucoma screening and checkup Recommended yearly dental visit for hygiene and checkup  Vaccinations: Influenza vaccine: up to date Pneumococcal vaccine: up to date Tdap vaccine: Education provided Shingles vaccine: up to date    Advanced directives: yes not on file     Preventive Care 65 Years and Older, Female Preventive care refers to lifestyle choices and visits with your health care provider that can promote health and wellness. What does preventive care include? A yearly physical exam. This is also called an annual well check. Dental exams once or twice a year. Routine eye exams. Ask your health care provider how often you should have your eyes checked. Personal lifestyle choices, including: Daily care of your teeth and gums. Regular physical activity. Eating a healthy diet. Avoiding tobacco and drug use. Limiting alcohol use. Practicing safe sex. Taking low-dose aspirin every day. Taking vitamin and mineral supplements as recommended by your health care provider. What happens during an annual well check? The services and screenings done by your health care provider during your annual well check will depend on your age, overall health, lifestyle risk factors, and family history of disease. Counseling  Your health care provider may ask you questions about your: Alcohol use. Tobacco use. Drug use. Emotional well-being. Home and relationship well-being. Sexual activity. Eating habits. History of falls. Memory and ability to understand (cognition). Work and work  Astronomer. Reproductive health. Screening  You may have the following tests or measurements: Height, weight, and BMI. Blood pressure. Lipid and cholesterol levels. These may be checked every 5 years, or more frequently if you are over 46 years old. Skin check. Lung cancer screening. You may have this screening every year starting at age 73 if you have a 30-pack-year history of smoking and currently smoke or have quit within the past 15 years. Fecal occult blood test (FOBT) of the stool. You may have this test every year starting at age 41. Flexible sigmoidoscopy or colonoscopy. You may have a sigmoidoscopy every 5 years or a colonoscopy every 10 years starting at age 38. Hepatitis C blood test. Hepatitis B blood test. Sexually transmitted disease (STD) testing. Diabetes screening. This is done by checking your blood sugar (glucose) after you have not eaten for a while (fasting). You may have this done every 1-3 years. Bone density scan. This is done to screen for osteoporosis. You may have this done starting at age 24. Mammogram. This may be done every 1-2 years. Talk to your health care provider about how often you should have regular mammograms. Talk with your health care provider about your test results, treatment options, and if necessary, the need for more tests. Vaccines  Your health care provider may recommend certain vaccines, such as: Influenza vaccine. This is recommended every year. Tetanus, diphtheria, and acellular pertussis (Tdap, Td) vaccine. You may need a Td booster every 10 years. Zoster vaccine. You may need this after age 60. Pneumococcal 13-valent conjugate (PCV13) vaccine. One dose is recommended after age 86. Pneumococcal polysaccharide (PPSV23) vaccine. One dose is recommended after age 87. Talk to your health care provider about which screenings and vaccines you need  and how often you need them. This information is not intended to replace advice given to you by  your health care provider. Make sure you discuss any questions you have with your health care provider. Document Released: 11/22/2015 Document Revised: 07/15/2016 Document Reviewed: 08/27/2015 Elsevier Interactive Patient Education  2017 [employer].  Fall Prevention in the Home Falls can cause injuries. They can happen to people of all ages. There are many things you can do to make your home safe and to help prevent falls. What can I do on the outside of my home? Regularly fix the edges of walkways and driveways and fix any cracks. Remove anything that might make you trip as you walk through a door, such as a raised step or threshold. Trim any bushes or trees on the path to your home. Use bright outdoor lighting. Clear any walking paths of anything that might make someone trip, such as rocks or tools. Regularly check to see if handrails are loose or broken. Make sure that both sides of any steps have handrails. Any raised decks and porches should have guardrails on the edges. Have any leaves, snow, or ice cleared regularly. Use sand or salt on walking paths during winter. Clean up any spills in your garage right away. This includes oil or grease spills. What can I do in the bathroom? Use night lights. Install grab bars by the toilet and in the tub and shower. Do not use towel bars as grab bars. Use non-skid mats or decals in the tub or shower. If you need to sit down in the shower, use a plastic, non-slip stool. Keep the floor dry. Clean up any water that spills on the floor as soon as it happens. Remove soap buildup in the tub or shower regularly. Attach bath mats securely with double-sided non-slip rug tape. Do not have throw rugs and other things on the floor that can make you trip. What can I do in the bedroom? Use night lights. Make sure that you have a light by your bed that is easy to reach. Do not use any sheets or blankets that are too big for your bed. They should not hang  down onto the floor. Have a firm chair that has side arms. You can use this for support while you get dressed. Do not have throw rugs and other things on the floor that can make you trip. What can I do in the kitchen? Clean up any spills right away. Avoid walking on wet floors. Keep items that you use a lot in easy-to-reach places. If you need to reach something above you, use a strong step stool that has a grab bar. Keep electrical cords out of the way. Do not use floor polish or wax that makes floors slippery. If you must use wax, use non-skid floor wax. Do not have throw rugs and other things on the floor that can make you trip. What can I do with my stairs? Do not leave any items on the stairs. Make sure that there are handrails on both sides of the stairs and use them. Fix handrails that are broken or loose. Make sure that handrails are as long as the stairways. Check any carpeting to make sure that it is firmly attached to the stairs. Fix any carpet that is loose or worn. Avoid having throw rugs at the top or bottom of the stairs. If you do have throw rugs, attach them to the floor with carpet tape. Make sure that you  have a light switch at the top of the stairs and the bottom of the stairs. If you do not have them, ask someone to add them for you. What else can I do to help prevent falls? Wear shoes that: Do not have high heels. Have rubber bottoms. Are comfortable and fit you well. Are closed at the toe. Do not wear sandals. If you use a stepladder: Make sure that it is fully opened. Do not climb a closed stepladder. Make sure that both sides of the stepladder are locked into place. Ask someone to hold it for you, if possible. Clearly mark and make sure that you can see: Any grab bars or handrails. First and last steps. Where the edge of each step is. Use tools that help you move around (mobility aids) if they are needed. These  include: Canes. Walkers. Scooters. Crutches. Turn on the lights when you go into a dark area. Replace any light bulbs as soon as they burn out. Set up your furniture so you have a clear path. Avoid moving your furniture around. If any of your floors are uneven, fix them. If there are any pets around you, be aware of where they are. Review your medicines with your doctor. Some medicines can make you feel dizzy. This can increase your chance of falling. Ask your doctor what other things that you can do to help prevent falls. This information is not intended to replace advice given to you by your health care provider. Make sure you discuss any questions you have with your health care provider. Document Released: 08/22/2009 Document Revised: 04/02/2016 Document Reviewed: 11/30/2014 Elsevier Interactive Patient Education  2017 [employer].

## 2023-05-12 NOTE — Progress Notes (Signed)
Subjective:   [name] is a 67 y.o. female who presents for Medicare Annual (Subsequent) preventive examination.  Visit Complete: Virtual  I connected with  [name] on 05/12/23 by a audio enabled telemedicine application and verified that I am speaking with the correct person using two identifiers.  Patient Location: Home  Provider Location: Home Office  I discussed the limitations of evaluation and management by telemedicine. The patient expressed understanding and agreed to proceed.    Review of Systems     Cardiac Risk Factors include: advanced age (>59men, >35 women);family history of premature cardiovascular disease;hypertension;diabetes mellitus     Objective:    Today's Vitals   There is no height or weight on file to calculate BMI.     05/12/2023    9:09 AM 01/29/2023    3:35 PM 07/17/2022    5:00 PM 06/30/2022   10:13 AM 04/14/2022    9:39 AM 02/27/2022    1:47 PM 02/22/2022    7:58 PM  Advanced Directives  Does Patient Have a Medical Advance Directive? Yes Yes Yes Yes No Yes No;Yes  Type of Estate agent of [employer] Power of [place];Living will Healthcare Power of [place];Living will Healthcare Power of [place];Living will  Healthcare Power of [place];Living will Healthcare Power of [place];Living will  Does patient want to make changes to medical advance directive?      No - Patient declined No - Patient declined  Copy of Healthcare Power of Attorney in Chart? No - copy requested   No - copy requested  No - copy requested No - copy requested  Would patient like information on creating a medical advance directive?     No - Patient declined      Current Medications (verified) Outpatient Encounter Medications as of 05/12/2023  Medication Sig   azelastine (ASTELIN) 0.1 % nasal spray PLACE 2 SPRAYS INTO EACH NOSTRIL TWO TIMES A DAY AS DIRECTED   blood glucose meter kit and supplies KIT Dispense based on patient and insurance  preference. Use up to four times daily as directed.   busPIRone (BUSPAR) 7.5 MG tablet Take 1 tablet (7.5 mg total) by mouth 2 (two) times daily.   Cholecalciferol (VITAMIN D3) 50 MCG (2000 UT) CAPS Take 2,000 Units by mouth daily.   Cholecalciferol 1.25 MG (50000 UT) TABS Take 1 tablet by mouth once a week.   CLARAVIS 20 MG capsule Take 20 mg by mouth daily.   clonazePAM (KLONOPIN) 2 MG tablet 1 mg 2 (two) times daily.    Cyanocobalamin (VITAMIN B-12 PO) Take by mouth.   empagliflozin (JARDIANCE) 10 MG TABS tablet Take 1 tablet (10 mg total) by mouth daily before breakfast.   empagliflozin (JARDIANCE) 10 MG TABS tablet Take 1 tablet (10 mg total) by mouth daily before breakfast.   fenofibrate (TRICOR) 145 MG tablet Take 1 tablet (145 mg total) by mouth daily.   fexofenadine (ALLEGRA) 180 MG tablet Take 1 tablet (180 mg total) by mouth daily.   fluticasone (FLONASE) 50 MCG/ACT nasal spray SPRAY TWO SPRAYS IN EACH NOSTRIL ONCE DAILY   gabapentin (NEURONTIN) 600 MG tablet Take 1,200 mg by mouth 2 (two) times daily.    glucose blood test strip Use as instructed   lamoTRIgine (LAMICTAL) 200 MG tablet Take 200 mg by mouth 2 (two) times daily.   Lancets (ONETOUCH DELICA PLUS LANCET30G) MISC Use twice daily to test blood sugars as directed Dx- uncontrolled DM II   losartan (COZAAR) 50  MG tablet Take 1 tablet (50 mg total) by mouth daily.   LUMIGAN 0.01 % SOLN Place 1 drop into both eyes at bedtime.    meloxicam (MOBIC) 15 MG tablet Take 1 tablet (15 mg total) by mouth daily.   metoprolol succinate (TOPROL-XL) 50 MG 24 hr tablet TAKE ONE TABLET BY MOUTH DAILY WITH A MEAL   montelukast (SINGULAIR) 10 MG tablet Take 1 tablet (10 mg total) by mouth at bedtime.   Multiple Vitamin (MULTIVITAMIN WITH MINERALS) TABS tablet Take 1 tablet by mouth daily.   OLANZapine (ZYPREXA) 5 MG tablet Take 5 mg by mouth daily as needed.   omega-3 acid ethyl esters (LOVAZA) 1 g capsule TAKE 2 CAPSULES BY MOUTH TWICE A DAY    PRALUENT 150 MG/ML SOAJ DIAL AND INJECT 150MG  INTO THE SKIN ONCE EVERY 14 DAYS   Pyridoxine HCl (VITAMIN B-6 PO) Take 1 tablet by mouth daily.   QUEtiapine (SEROQUEL XR) 400 MG 24 hr tablet Take 400 mg by mouth at bedtime.   QUEtiapine (SEROQUEL) 50 MG tablet Take 50 mg by mouth daily.   timolol (TIMOPTIC) 0.5 % ophthalmic solution SMARTSIG:In Eye(s)   venlafaxine (EFFEXOR-XR) 75 MG 24 hr capsule Take 75 mg by mouth. Take 2 tabs in the am and 1 tab in afternoonn   SUMAtriptan (IMITREX) 50 MG tablet Take 1 tablet (50 mg total) by mouth once for 1 dose. May repeat in 2 hours if headache persists or recurs.   No facility-administered encounter medications on file as of 05/12/2023.    Allergies (verified) Nitrofurantoin monohyd macro, Statins, Macrobid [nitrofurantoin], Alprazolam, Amoxicillin, Prednisone, and Sulfa antibiotics   History: Past Medical History:  Diagnosis Date   Allergic rhinitis    Allergy    Anxiety    Arthritis    R knee, Left Knee   Asthma    Bipolar disorder (HCC)    Cataract    Complication of anesthesia    had bad anxiety after anesthesia   Constipation, chronic    Depression    Diabetes mellitus without complication (HCC)    Dysrhythmia    Palpations occ, takes Tenormin   GERD (gastroesophageal reflux disease)    Glaucoma    Heart murmur    Hx of adenomatous colonic polyps 2008   Hyperlipidemia    Hypertension    MVP (mitral valve prolapse)    Sleep apnea    wears CPAP   Urinary tract infection    frequent, see Urololgist 12/16/2010   Past Surgical History:  Procedure Laterality Date   ABDOMINAL HYSTERECTOMY  2002   no oophorectomy   BACK SURGERY  2001   L5 - S1   CATARACT EXTRACTION     COLONOSCOPY     NASAL SINUS SURGERY  01/2010   x2   TONSILLECTOMY     TOTAL KNEE ARTHROPLASTY  12/23/2011   Procedure: TOTAL KNEE ARTHROPLASTY;  Surgeon: Loreta Ave, MD;  Location: [facility];  Service: Orthopedics;  Laterality: Right;  DR MURPHY WANTS 120  MINUTES FOR SURGERY   TOTAL KNEE ARTHROPLASTY  08/31/2012   Procedure: TOTAL KNEE ARTHROPLASTY;  Surgeon: Loreta Ave, MD;  Location: [facility] OR;  Service: Orthopedics;  Laterality: Left;   Family History  Problem Relation Age of Onset   Lung disease Mother        Mycobacterium avium intracellulare   Coronary artery disease Father    Heart attack Father 45   Heart disease Father    Hypertension Brother    Hyperlipidemia  Brother    Alcohol abuse Brother    Anxiety disorder Brother    Healthy Brother    Diabetes Maternal Grandmother    Stroke Maternal Grandmother    Stroke Maternal Grandfather    Heart attack Paternal Grandmother    Anesthesia problems Neg Hx    Colon cancer Neg Hx    Esophageal cancer Neg Hx    Rectal cancer Neg Hx    Stomach cancer Neg Hx    Social History   Socioeconomic History   Marital status: Single    Spouse name: Not on file   Number of children: 0   Years of education: 13   Highest education level: Some college, no degree  Occupational History   Occupation: retired//disability    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 No kids. Grew up in [place]. Had a great childhood. Has 2 older brothers.   Mom was a stay at home mom. Her dad worked at [employer] as a Production manager. He died when pt was 67yo.    Never abused.       Disabled d/t Bipolar since 1992.   Religion-Christian   No legal issues.    Lives alone. Has 1 dog. Has pet sitting business.   2 cups of coffee a day.   Social Determinants of Health    Financial Resource Strain: Low Risk  (04/29/2023)   Overall Financial Resource Strain (CARDIA)    Difficulty of Paying Living Expenses: Not very hard  Food Insecurity: Food Insecurity Present (04/29/2023)   Hunger Vital Sign    Worried About Running Out of Food in the Last Year: Sometimes true    Ran Out of Food in the Last Year: Patient declined  Transportation Needs: No Transportation Needs (04/29/2023)   PRAPARE - Administrator, Civil Service (Medical): No    Lack of Transportation (Non-Medical): No  Physical Activity: Insufficiently Active (04/29/2023)   Exercise Vital Sign    Days of Exercise per Week: 3 days    Minutes of Exercise per Session: 30 min  Stress: No Stress Concern Present (04/29/2023)   [employer] of Occupational Health - Occupational Stress Questionnaire    Feeling of Stress : Only a little  Social Connections: Unknown (04/29/2023)   Social Connection and Isolation Panel [NHANES]    Frequency of Communication with Friends and Family: Once a week    Frequency of Social Gatherings with Friends and Family: Twice a week    Attends Religious Services: Patient declined    Database administrator or Organizations: No    Attends Engineer, structural: Never    Marital Status: Divorced    Tobacco Counseling Counseling given: Not Answered   Clinical Intake:  Pre-visit preparation completed: Yes  Pain : No/denies pain     Diabetes: Yes CBG done?: No Did pt. bring in CBG monitor from home?: No  How often do you need to have someone help you when you read  instructions, pamphlets, or other written materials from your doctor or pharmacy?: 1 - Never  Interpreter Needed?: No  Information entered by :: Remi Haggard LPN   Activities of Daily Living    05/12/2023    9:09 AM 06/30/2022   10:24 AM  In your present state of health, do you have any difficulty performing the following activities:  Hearing? 0 0  Vision? 0 0  Difficulty  concentrating or making decisions? 0 0  Walking or climbing stairs? 0 0  Dressing or bathing? 0 0  Doing errands, shopping? 0 0  Preparing Food and eating ? N N  Using the Toilet? N N  In the past six months, have you accidently leaked urine? N N  Do you have problems with loss of bowel control? N N  Managing your Medications? N N  Managing your Finances? N N  Housekeeping or managing your Housekeeping? N N    Patient Care Team: Sheliah Hatch, MD as PCP - General (Family Medicine) Corky Crafts, MD as PCP - Cardiology (Cardiology) Sheliah Hatch, MD Jerene Bears, MD as Consulting Physician (Gynecology) Corky Crafts, MD as Consulting Physician (Cardiology) Newman Pies, MD as Consulting Physician (Otolaryngology) Lynden Ang, MD as Referring Physician (Psychiatry) Oneta Rack, NP as Nurse Practitioner (Adult Health Nurse Practitioner) Earnest Rosier, LCSW as Social Worker (Licensed Clinical Social Worker) Huston Foley, MD as Attending Physician (Neurology) Erroll Luna, [facility] (Inactive) (Pharmacist)  Indicate any recent Medical Services you may have received from other than [facility] providers in the past year (date may be approximate).     Assessment:   This is a routine wellness examination for [name].  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 0 1 0 0 0  Number falls in past yr: 0 1 0 0 0  Injury with Fall? 0 0 0 0 0  Risk for fall due to :  History of fall(s) No Fall Risks No Fall Risks No Fall Risks  Follow up Falls evaluation completed;Education provided;Falls prevention discussed Falls evaluation completed Falls evaluation completed Falls evaluation completed Falls evaluation completed    MEDICARE RISK AT HOME:  Medicare Risk at Home - 05/12/23 0908     Any stairs in or around the home? No    If so, are there any without handrails? No    Home free of loose throw rugs in walkways, pet beds, electrical cords, etc? Yes  Adequate lighting in your home to reduce risk of falls? Yes    Life alert? No    Use of a cane, walker or w/c? No    Grab bars in the bathroom? Yes    Shower chair or bench in shower? No    Elevated toilet seat or a handicapped toilet? No             TIMED UP AND GO:  Was the test performed?  No    Cognitive Function:    PATIENT DECLINED TAKING MEMORY TEST     08/08/2019   10:57 AM 08/03/2018   10:40 AM 08/13/2015   10:25 AM  MMSE - Mini Mental State Exam  Orientation to time 5 5 5   Orientation to Place 5 5 5   Registration 3 3 3   Attention/ Calculation 5 5 5   Recall 3 2 1   Language- name 2 objects 2 2 2   Language- repeat 1 1 1   Language- follow 3 step command 3 3 3   Language- read & follow direction 1 1 1   Write a sentence 1 1 1   Copy design 1 1 1   Total score 30 29 28         06/30/2022   10:27 AM  6CIT Screen  What Year? 0 points  What month? 0 points  What time? 0 points  Count back from 20 0 points  Months in reverse 0 points  Repeat phrase 0 points  Total Score 0 points    Immunizations Immunization History  Administered Date(s) Administered   Influenza Split 09/02/2012   Influenza,inj,Quad PF,6+ Mos 10/09/2013, 08/13/2015, 08/17/2016, 07/28/2017, 08/03/2018, 08/08/2019, 07/20/2020, 08/05/2021, 07/16/2022    Moderna Sars-Covid-2 Vaccination 02/27/2019, 01/27/2020, 09/11/2020, 04/13/2021   PNEUMOCOCCAL CONJUGATE-20 10/13/2022   Pneumococcal Polysaccharide-23 09/02/2012   Tdap 07/15/2012   Zoster Recombinant(Shingrix) 11/10/2018, 06/09/2019  Nutrition Risk Assessment:  Has the patient had any N/V/D within the last 2 months?  No  Does the patient have any non-healing wounds?  No  Has the patient had any unintentional weight loss or weight gain?  No   Diabetes:  Is the patient diabetic?  Yes  If diabetic, was a CBG obtained today?  No  Did the patient bring in their glucometer from home?  No  How often do you monitor your CBG's? 2x a day.   Financial Strains and Diabetes Management:  Are you having any financial strains with the device, your supplies or your medication? No .  Does the patient want to be seen by Chronic Care Management for management of their diabetes?  No  Would the patient like to be referred to a Nutritionist or for Diabetic Management?  No   Diabetic Exams:  Diabetic Eye Exam: Completed .  Pt has been advised about the importance in completing this exam. A referral has been placed today. Message sent to referral coordinator for scheduling purposes. Advised pt to expect a call from office referred to regarding appt.  Diabetic Foot Exam:. Pt has been advised about the importance in completing this exam.   TDAP status: Due, Education has been provided regarding the importance of this vaccine. Advised may receive this vaccine at local pharmacy or Health Dept. Aware to provide a copy of the vaccination record if obtained from local pharmacy or Health Dept. Verbalized acceptance and understanding.  Flu Vaccine status: Up to date  Pneumococcal vaccine status: Up to date  Covid-19 vaccine status: Information provided on how to obtain vaccines.   Qualifies for Shingles Vaccine? No  Zostavax completed No   Shingrix Completed?: No.    Education has been provided regarding  the importance of this vaccine. Patient has been advised to call insurance company to determine out of pocket expense if they have not yet received this vaccine. Advised may also receive vaccine at local pharmacy or Health Dept. Verbalized acceptance and understanding.  Screening Tests Health Maintenance  Topic Date Due   FOOT EXAM  05/05/2023   Medicare Annual Wellness (AWV)  07/01/2023   INFLUENZA VACCINE  06/10/2023   Diabetic kidney evaluation - Urine ACR  06/26/2023   OPHTHALMOLOGY EXAM  07/08/2023   MAMMOGRAM  07/09/2023   HEMOGLOBIN A1C  08/28/2023   Diabetic kidney evaluation - eGFR measurement  02/26/2024   Colonoscopy  07/16/2024   Pneumonia Vaccine 35+ Years old  Completed   DEXA SCAN  Completed   Hepatitis C Screening  Completed   Zoster Vaccines- Shingrix  Completed   HPV VACCINES  Aged Out   DTaP/Tdap/Td  Discontinued   COVID-19 Vaccine  Discontinued   HIV Screening  Discontinued    Health Maintenance  Health Maintenance Due  Topic Date Due   FOOT EXAM  05/05/2023   Medicare Annual Wellness (AWV)  07/01/2023    Colorectal cancer screening: Type of screening: Colonoscopy. Completed 2022. Repeat every 3 years  Mammogram status: Completed  . Repeat every year  Bone Density status: Completed  . Results reflect: Bone density results: NORMAL. Repeat every 2 years.  Lung Cancer Screening: (Low Dose CT Chest recommended if Age 12-80 years, 20 pack-year currently smoking OR have quit w/in 15years.) does not qualify.   Lung Cancer Screening Referral:   Additional Screening:  Hepatitis C Screening: does not qualify; Completed 2017  Vision Screening: Recommended annual ophthalmology exams for early detection of glaucoma and other disorders of the eye. Is the patient up to date with their annual eye exam?  Yes  Who is the provider or what is the name of the office in which the patient attends annual eye exams? [facility] If pt is not established with a provider, would  they like to be referred to a provider to establish care? No .   Dental Screening: Recommended annual dental exams for proper oral hygiene    Community Resource Referral / Chronic Care Management: CRR required this visit?  No   CCM required this visit?  No     Plan:     I have personally reviewed and noted the following in the patient's chart:   Medical and social history Use of alcohol, tobacco or illicit drugs  Current medications and supplements including opioid prescriptions. Patient is not currently taking opioid prescriptions. Functional ability and status Nutritional status Physical activity Advanced directives List of other physicians Hospitalizations, surgeries, and ER visits in previous 12 months Vitals Screenings to include cognitive, depression, and falls Referrals and appointments  In addition, I have reviewed and discussed with patient certain preventive protocols, quality metrics, and best practice recommendations. A written personalized care plan for preventive services as well as general preventive health recommendations were provided to patient.     Remi Haggard, LPN   11/09/9145   After Visit Summary: (MyChart) Due to this being a telephonic visit, the after visit summary with patients personalized plan was offered to patient via MyChart   Nurse Notes:

## 2023-05-12 NOTE — Telephone Encounter (Signed)
-----   Message from Alveria Apley, NP sent at 05/12/2023  9:46 AM EDT ----- I am covering for Dr. Beverely Low while she is out of the office. Your urine culture has come back with E. Coli causing your urinary tract infection. She prescribed Ciprofloxacin at your appointment. This antibiotic will cover the infection. If symptoms persist, follow up.

## 2023-05-27 ENCOUNTER — Ambulatory Visit: Payer: Medicare Other | Admitting: Family Medicine

## 2023-05-27 ENCOUNTER — Encounter: Payer: Self-pay | Admitting: Family Medicine

## 2023-05-27 VITALS — BP 106/80 | HR 66 | Temp 98.0°F | Resp 18 | Ht 69.0 in | Wt 181.4 lb

## 2023-05-27 DIAGNOSIS — G629 Polyneuropathy, unspecified: Secondary | ICD-10-CM

## 2023-05-27 DIAGNOSIS — E782 Mixed hyperlipidemia: Secondary | ICD-10-CM

## 2023-05-27 DIAGNOSIS — I10 Essential (primary) hypertension: Secondary | ICD-10-CM

## 2023-05-27 MED ORDER — METOPROLOL SUCCINATE ER 50 MG PO TB24: ORAL_TABLET | ORAL | 2 refills | Status: DC

## 2023-05-27 MED ORDER — LOSARTAN POTASSIUM 50 MG PO TABS: 50.0000 mg | ORAL_TABLET | Freq: Every day | ORAL | 6 refills | Status: DC

## 2023-05-27 MED ORDER — MONTELUKAST SODIUM 10 MG PO TABS: 10.0000 mg | ORAL_TABLET | Freq: Every day | ORAL | 1 refills | Status: AC

## 2023-05-27 MED ORDER — FENOFIBRATE 145 MG PO TABS: 145.0000 mg | ORAL_TABLET | Freq: Every day | ORAL | 1 refills | Status: DC

## 2023-05-27 NOTE — Progress Notes (Unsigned)
   Subjective:    Patient ID: [name], female    DOB: 03-01-1956, 67 y.o.   MRN: [id number]  HPI Feet burning- pt reports feet started burning earlier this week.  She got new sneakers but the burning was present prior to getting new shoes.  She keeps laces loose.  Feels that the burning will migrate around her feet.  Currently on Gabapentin 600mg  4x/day.     Review of Systems For ROS see HPI     Objective:   Physical Exam Vitals reviewed.  Constitutional:      General: She is not in acute distress.    Appearance: Normal appearance. She is not ill-appearing.  Cardiovascular:     Pulses: Normal pulses.  Musculoskeletal:     Right lower leg: No edema.     Left lower leg: No edema.  Skin:    General: Skin is warm and dry.     Findings: No lesion or rash.  Neurological:     Mental Status: She is alert and oriented to person, place, and time.     Motor: No weakness.     Gait: Gait normal.  Psychiatric:     Comments: anxious           Assessment & Plan:

## 2023-05-27 NOTE — Patient Instructions (Signed)
Follow up as needed or as scheduled We'll call you to schedule your Neurology appt but if you don't hear by mid-week, go ahead and call Call with any questions or concerns Stay Safe!  Stay Healthy! Hang in there!!

## 2023-06-01 NOTE — Assessment & Plan Note (Signed)
New.  Pt reports sxs started earlier this week.  She is already on Gabapentin 600mg  4x/day.  Recent labs didn't indicate a B12 or folate deficiency.  Pt sees [facility].  Will help her schedule by placing a referral but she knows to call them if she doesn't hear in a week.

## 2023-06-04 ENCOUNTER — Telehealth: Payer: Self-pay | Admitting: Family Medicine

## 2023-06-04 ENCOUNTER — Other Ambulatory Visit: Payer: Self-pay

## 2023-06-04 DIAGNOSIS — E1100 Type 2 diabetes mellitus with hyperosmolarity without nonketotic hyperglycemic-hyperosmolar coma (NKHHC): Secondary | ICD-10-CM

## 2023-06-04 MED ORDER — GLUCOSE BLOOD VI STRP: ORAL_STRIP | 12 refills | Status: DC

## 2023-06-04 NOTE — Telephone Encounter (Signed)
1.Medication Requested: glucose blood test strip   2. Pharmacy (Name, Street, [facility]):  [id number] - [place], [place] - [address]    3. On Med List: YES   4. Last Visit with PCP: 7.18.24  5. Next visit date with PCP: N/A   Agent: Please be advised that RX refills may take up to 3 business days. We ask that you follow-up with your pharmacy.

## 2023-06-04 NOTE — Telephone Encounter (Signed)
Sent to the requested pharmacy and called patient to let her know she was very appreciative

## 2023-06-07 ENCOUNTER — Telehealth: Payer: Self-pay

## 2023-06-07 NOTE — Telephone Encounter (Signed)
Pharmacy sent a refill request on pt Onetouch ultra test strips . I do not see this in her medication list . Can we send a Rx for this in ?

## 2023-06-08 ENCOUNTER — Other Ambulatory Visit: Payer: Self-pay

## 2023-06-08 DIAGNOSIS — E1100 Type 2 diabetes mellitus with hyperosmolarity without nonketotic hyperglycemic-hyperosmolar coma (NKHHC): Secondary | ICD-10-CM

## 2023-06-08 MED ORDER — GLUCOSE BLOOD VI STRP: ORAL_STRIP | 12 refills | Status: DC

## 2023-06-08 NOTE — Telephone Encounter (Signed)
Test strips sent to pharmacy.

## 2023-06-08 NOTE — Telephone Encounter (Signed)
Ok to send prescription for test strips.  Please include her diabetes dx code on the prescription as medicare usually requires

## 2023-06-09 ENCOUNTER — Other Ambulatory Visit: Payer: Self-pay

## 2023-06-09 DIAGNOSIS — E1100 Type 2 diabetes mellitus with hyperosmolarity without nonketotic hyperglycemic-hyperosmolar coma (NKHHC): Secondary | ICD-10-CM

## 2023-06-09 MED ORDER — GLUCOSE BLOOD VI STRP: ORAL_STRIP | 12 refills | Status: AC

## 2023-06-28 ENCOUNTER — Other Ambulatory Visit: Payer: Self-pay | Admitting: Interventional Cardiology

## 2023-07-02 ENCOUNTER — Encounter: Payer: Self-pay | Admitting: Family Medicine

## 2023-07-02 ENCOUNTER — Ambulatory Visit: Payer: Medicare Other | Admitting: Family Medicine

## 2023-07-02 VITALS — BP 120/70 | HR 66 | Temp 98.4°F | Resp 18 | Ht 69.0 in | Wt 178.5 lb

## 2023-07-02 DIAGNOSIS — R29898 Other symptoms and signs involving the musculoskeletal system: Secondary | ICD-10-CM | POA: Diagnosis not present

## 2023-07-02 NOTE — Progress Notes (Unsigned)
   Subjective:    Patient ID: [name], female    DOB: [birth year], 67 y.o.   MRN: [id number]  HPI Leg weakness- pt reports sxs started 'a couple of weeks' ago.  Still trying to walk regularly.  She doesn't feel like she's going to fall.  Feels it is more muscle related.  Sxs in both legs.  Denies back pain.  Sxs don't start immediately but occur w/ prolonged standing or walking.  Sxs are primarily located in the thighs.   Review of Systems For ROS see HPI     Objective:   Physical Exam Vitals reviewed.  Constitutional:      General: She is not in acute distress.    Appearance: Normal appearance. She is not ill-appearing.  HENT:     Head: Normocephalic and atraumatic.  Cardiovascular:     Rate and Rhythm: Normal rate.  Pulmonary:     Effort: Pulmonary effort is normal. No respiratory distress.  Skin:    General: Skin is warm and dry.  Neurological:     General: No focal deficit present.     Mental Status: She is alert and oriented to person, place, and time.     Motor: No weakness.     Coordination: Coordination normal.     Gait: Gait normal.  Psychiatric:     Comments: anxious           Assessment & Plan:  Leg weakness- recurrent issue for pt.  Sxs don't occur immediately upon standing, but w/ prolonged standing or walking.  Sxs are bilateral, which may mean lumbar issue.  She has seen [facility] in the past- encouraged her to schedule an appt.  Strength in office is 5/5 and symmetric.  Gait is normal.  Pt to let me know if she has trouble getting an appt.  Pt expressed understanding and is in agreement w/ plan.

## 2023-07-02 NOTE — Patient Instructions (Addendum)
Follow up as needed or as scheduled CALL [facility] to schedule an appointment regarding the Neuropathy in your feet [phone] [facility] to schedule an appt regarding your leg weakness [phone] Try and keep exercising and being active Call with any questions or concerns Hang in there!!

## 2023-07-03 ENCOUNTER — Other Ambulatory Visit: Payer: Self-pay

## 2023-07-03 ENCOUNTER — Emergency Department (HOSPITAL_BASED_OUTPATIENT_CLINIC_OR_DEPARTMENT_OTHER)
Admission: EM | Admit: 2023-07-03 | Discharge: 2023-07-03 | Disposition: A | Discharge status: Home / Self Care | Payer: Medicare Other | Attending: Emergency Medicine | Admitting: Emergency Medicine

## 2023-07-03 ENCOUNTER — Encounter (HOSPITAL_BASED_OUTPATIENT_CLINIC_OR_DEPARTMENT_OTHER): Payer: Self-pay

## 2023-07-03 DIAGNOSIS — S80211A Abrasion, right knee, initial encounter: Secondary | ICD-10-CM | POA: Insufficient documentation

## 2023-07-03 DIAGNOSIS — W01198A Fall on same level from slipping, tripping and stumbling with subsequent striking against other object, initial encounter: Secondary | ICD-10-CM | POA: Insufficient documentation

## 2023-07-03 DIAGNOSIS — S50312A Abrasion of left elbow, initial encounter: Secondary | ICD-10-CM | POA: Diagnosis not present

## 2023-07-03 DIAGNOSIS — S59902A Unspecified injury of left elbow, initial encounter: Secondary | ICD-10-CM | POA: Diagnosis present

## 2023-07-03 DIAGNOSIS — S80212A Abrasion, left knee, initial encounter: Secondary | ICD-10-CM | POA: Diagnosis not present

## 2023-07-03 DIAGNOSIS — T07XXXA Unspecified multiple injuries, initial encounter: Secondary | ICD-10-CM

## 2023-07-03 NOTE — Discharge Instructions (Signed)
You came to the emergency department with multiple scrapes after a fall at the store.  You were evaluated by physician and at this time there is no need for further imaging or lab tests.  There are no signs of head injury or concussion.  There is also no evidence of fracture, dislocation, or effusion.  You are medically stable for discharge.  If you experience severe headache, vision changes, or fainting in the next few days, please return to the emergency department.

## 2023-07-03 NOTE — ED Provider Notes (Signed)
[facility] EMERGENCY DEPARTMENT AT [facility] Provider Note   CSN: [id number] Arrival date & time: 07/03/23  2204     History  Chief Complaint  Patient presents with   Fall   [name] is a 67 y.o. female who presents with left elbow and minor bilateral knee abrasions.  She said she was walking out of [employer] today and tripped over a chain, falling forward onto hands and knees.  She says she hit her chin on the ground. She also scraped her knees and elbows on the ground.  She says her left elbow is the worst abrasion. She was able to ambulate and denies hitting her head other than her chin. Denies any other symptoms.     Home Medications Prior to Admission medications   Medication Sig Start Date End Date Taking? Authorizing Provider  azelastine (ASTELIN) 0.1 % nasal spray PLACE 2 SPRAYS INTO EACH NOSTRIL TWO TIMES A DAY AS DIRECTED 08/22/21   Sheliah Hatch, MD  blood glucose meter kit and supplies KIT Dispense based on patient and insurance preference. Use up to four times daily as directed. 04/15/22   Sheliah Hatch, MD  busPIRone (BUSPAR) 7.5 MG tablet Take 1 tablet (7.5 mg total) by mouth 2 (two) times daily. 04/19/23   Sheliah Hatch, MD  Cholecalciferol (VITAMIN D3) 50 MCG (2000 UT) CAPS Take 2,000 Units by mouth daily.    [provider]  Cholecalciferol 1.25 MG (50000 UT) TABS Take 1 tablet by mouth once a week. 02/13/22   Gweneth Dimitri, MD  CLARAVIS 20 MG capsule Take 20 mg by mouth daily. 07/11/21   [provider]  clonazePAM (KLONOPIN) 2 MG tablet 1 mg 2 (two) times daily.  10/23/15   [provider]  Cyanocobalamin (VITAMIN B-12 PO) Take by mouth.    [provider]  empagliflozin (JARDIANCE) 10 MG TABS tablet Take 1 tablet (10 mg total) by mouth daily before breakfast. 08/26/22   Corky Crafts, MD  empagliflozin (JARDIANCE) 10 MG TABS tablet Take 1 tablet (10 mg total) by mouth daily before breakfast.  04/23/23   Sheliah Hatch, MD  fenofibrate (TRICOR) 145 MG tablet Take 1 tablet (145 mg total) by mouth daily. 05/27/23   Sheliah Hatch, MD  fexofenadine (ALLEGRA) 180 MG tablet Take 1 tablet (180 mg total) by mouth daily. 09/01/22   Sheliah Hatch, MD  fluticasone Aleda Grana) 50 MCG/ACT nasal spray SPRAY TWO SPRAYS IN EACH NOSTRIL ONCE DAILY 03/17/22   Sheliah Hatch, MD  gabapentin (NEURONTIN) 600 MG tablet Take 1,200 mg by mouth 2 (two) times daily.     [provider]  glucose blood test strip Use as instructed 06/04/23   Sheliah Hatch, MD  glucose blood test strip Use as instructed DX E11.0 06/09/23   Sheliah Hatch, MD  lamoTRIgine (LAMICTAL) 200 MG tablet Take 200 mg by mouth 2 (two) times daily.    [provider]  Lancets [facility] DELICA PLUS [place]) MISC Use twice daily to test blood sugars as directed Dx- uncontrolled DM II 04/19/23   Sheliah Hatch, MD  losartan (COZAAR) 50 MG tablet Take 1 tablet (50 mg total) by mouth daily. 05/27/23   Sheliah Hatch, MD  LUMIGAN 0.01 % SOLN Place 1 drop into both eyes at bedtime.  10/19/19   [provider]  meloxicam (MOBIC) 15 MG tablet Take 1 tablet (15 mg total) by mouth daily. 10/19/22   Neena Rhymes  E, MD  metoprolol succinate (TOPROL-XL) 50 MG 24 hr tablet TAKE ONE TABLET BY MOUTH DAILY WITH A MEAL 05/27/23   Sheliah Hatch, MD  montelukast (SINGULAIR) 10 MG tablet Take 1 tablet (10 mg total) by mouth at bedtime. 05/27/23   Sheliah Hatch, MD  Multiple Vitamin (MULTIVITAMIN WITH MINERALS) TABS tablet Take 1 tablet by mouth daily.    [provider]  OLANZapine (ZYPREXA) 5 MG tablet Take 5 mg by mouth daily as needed.    [provider]  omega-3 acid ethyl esters (LOVAZA) 1 g capsule Take 2 capsules (2 g total) by mouth 2 (two) times daily. Please call [phone] to schedule an overdue appointment for future refill. Thank you. Final attempt. 06/28/23    Corky Crafts, MD  PRALUENT 150 MG/ML SOAJ DIAL AND INJECT 150MG  INTO THE SKIN ONCE EVERY 14 DAYS 01/22/23   Corky Crafts, MD  Pyridoxine HCl (VITAMIN B-6 PO) Take 1 tablet by mouth daily.    [provider]  QUEtiapine (SEROQUEL XR) 400 MG 24 hr tablet Take 400 mg by mouth at bedtime.    [provider]  QUEtiapine (SEROQUEL) 50 MG tablet Take 50 mg by mouth daily. 04/29/22   [provider]  SUMAtriptan (IMITREX) 50 MG tablet Take 1 tablet (50 mg total) by mouth once for 1 dose. May repeat in 2 hours if headache persists or recurs. 12/04/22 04/30/23  Sheliah Hatch, MD  timolol (TIMOPTIC) 0.5 % ophthalmic solution SMARTSIG:In Eye(s) 01/27/22   [provider]  venlafaxine (EFFEXOR-XR) 75 MG 24 hr capsule Take 75 mg by mouth. Take 2 tabs in the am and 1 tab in afternoonn    [provider]      Allergies    Nitrofurantoin monohyd macro, Statins, Macrobid [nitrofurantoin], Alprazolam, Amoxicillin, Prednisone, and Sulfa antibiotics    Review of Systems   Review of Systems  Constitutional: Negative.   HENT: Negative.    Eyes: Negative.   Respiratory: Negative.    Cardiovascular: Negative.   Gastrointestinal: Negative.   Endocrine: Negative.   Genitourinary: Negative.   Musculoskeletal:  Positive for arthralgias.  Skin:  Positive for wound.       Scrapes on elbows and knees  Allergic/Immunologic: Negative.   Neurological: Negative.   Hematological: Negative.   Psychiatric/Behavioral: Negative.      Physical Exam Updated Vital Signs BP 126/74   Pulse 60   Temp 98 F (36.7 C)   Resp 18   Ht 5\' 9"  (1.753 m)   Wt 77.6 kg   LMP 11/09/2000   SpO2 97%   BMI 25.25 kg/m  Physical Exam HENT:     Head: Normocephalic and atraumatic.  Musculoskeletal:        General: Normal range of motion.     Cervical back: Normal range of motion and neck supple. No tenderness.  Skin:    Findings: Lesion present.     Comments: Mild  abrasion on left elbow  Minimal abrasions on bilateral knees  Neurological:     General: No focal deficit present.     Mental Status: She is alert and oriented to person, place, and time.     ED Results / Procedures / Treatments   Labs (all labs ordered are listed, but only abnormal results are displayed) Labs Reviewed - No data to display  EKG None  Radiology No results found.  Procedures Procedures   Medications Ordered in ED Medications - No data to display  ED  Course/ Medical Decision Making/ A&P                                 Medical Decision Making  Shali Hau is a 67 year old female who presents with left elbow abrasion and very minimal knee abrasions following a fall.  Physical exam is non-concerning. There is no joint swelling or significant pain. Passive and active ROM are in tact in all joints. The abrasions are minor. There is no injury to the patient's chin. She is alert and oriented, no signs of concussion.   At this time, she does not have any injuries that warrant further imaging or labs. She is medically stable for discharge with return precautions.   Final Clinical Impression(s) / ED Diagnoses Final diagnoses:  Abrasions of multiple sites     Annett Fabian, MD 07/03/23 2324    Lorre Nick, MD 07/04/23 1739

## 2023-07-03 NOTE — ED Triage Notes (Signed)
Patient states she tripped over a chain in her path, denies head injury, denies LOC, minimal abrasions to bilateral knees, abrasion to left elbow bandaged PTA.

## 2023-07-03 NOTE — ED Provider Notes (Signed)
I saw and evaluated the patient, reviewed the resident's note and I agree with the findings and plan.      67 year old female here with mechanical fall just prior to arrival.  No head injury.  Abrasions noted to bilateral knees.  No indications for imaging at this time.  Will be discharged   Lorre Nick, MD 07/03/23 2311

## 2023-07-03 NOTE — ED Notes (Signed)
Reviewed AVS with patient, patient expressed understanding of directions, denies further questions at this time. 

## 2023-07-07 ENCOUNTER — Encounter: Payer: Self-pay | Admitting: Pharmacist

## 2023-07-07 NOTE — Progress Notes (Signed)
Pharmacy Quality Measure Review  This patient is appearing on report for being at risk of failing the measure for Statin Use in Persons with Diabetes (SUPD) medications this calendar year.   Patient is taking Praulent. Patient is receiving Praluent from [employer] medication assistance program.   She has been intolerant to statin medications in the past. She has tried the following:  Atorvastatin - patient cannot remember side effect Rosuvastatin - took 20mg  in 2017 and 40mg  in 2015 - stopped due to weakness  Simvastatin - took from 08/2014 to 09/2015 until she stopped due to leg / muscle pain and feeling sick/nauseated Pravastatin 06/2015 to 09/2015 - not sure why stopped - possibly did not get LDL to goal or muscle pain.   She has also tried ezetimibe - caused GI upset; Niacin - stopped due to severe flushing; Repatha -stopped due to cost   Patient will see DR Beverely Low tomorrow 08/29 for follow up of fall from Saturday, 8/24.  Will forward message to Dr Beverely Low to assess SUPD / past statin intolerance and code as appropriate for exclusion from SUPD at appointment tomorrow if she has enough time.   Henrene Pastor, PharmD Clinical Pharmacist [facility] Primary [facility]  Population Health [phone]

## 2023-07-08 ENCOUNTER — Ambulatory Visit: Payer: Medicare Other | Admitting: Family Medicine

## 2023-07-08 ENCOUNTER — Encounter: Payer: Self-pay | Admitting: Family Medicine

## 2023-07-08 VITALS — BP 94/58 | HR 68 | Temp 98.3°F | Resp 18 | Ht 69.0 in | Wt 180.0 lb

## 2023-07-08 DIAGNOSIS — R29898 Other symptoms and signs involving the musculoskeletal system: Secondary | ICD-10-CM

## 2023-07-08 NOTE — Patient Instructions (Signed)
Follow up as needed or as scheduled STOP the Metoprolol No other med changes at this time Message me via MyChart in 1-2 weeks to let me know how you're feeling Call with any questions or concerns Stay Safe!  Stay Healthy! Hang in there!!

## 2023-07-08 NOTE — Progress Notes (Signed)
   Subjective:    Patient ID: [name], female    DOB: 1956/10/30, 67 y.o.   MRN: [id number]  HPI ER f/u- pt was seen in ER on 8/24 after tripping over a chain at [employer].  Thankfully injuries were only superficial abrasions to elbows and knees.  No imaging was done.  No labs.  Since the fall, pt has been very anxious.  'my nerves are still messed up'.  Pt has had weakness and fatigue and feeling off balance.  Currently taking 1/2 Metoprolol daily.  BP 94/58, HR 68   Review of Systems For ROS see HPI     Objective:   Physical Exam Vitals reviewed.  Constitutional:      General: She is not in acute distress.    Appearance: Normal appearance. She is not ill-appearing.  HENT:     Head: Normocephalic and atraumatic.  Cardiovascular:     Rate and Rhythm: Normal rate and regular rhythm.     Pulses: Normal pulses.     Heart sounds: Normal heart sounds.  Pulmonary:     Effort: Pulmonary effort is normal. No respiratory distress.     Breath sounds: No wheezing.  Musculoskeletal:     Right lower leg: No edema.     Left lower leg: No edema.  Skin:    General: Skin is warm and dry.  Neurological:     General: No focal deficit present.     Mental Status: She is alert and oriented to person, place, and time.  Psychiatric:     Comments: anxious           Assessment & Plan:  Leg weakness- ongoing issue.  Having issues w/ fatigue and legs feeling like they're giving way.  HR and BP are low.  Will stop Metoprolol entirely to see if sxs improve.  She already has appts w/ Neuro and Neurosurgery pending.  Pt expressed understanding and is in agreement w/ plan.

## 2023-07-12 ENCOUNTER — Other Ambulatory Visit: Payer: Self-pay | Admitting: Interventional Cardiology

## 2023-07-14 ENCOUNTER — Other Ambulatory Visit: Payer: Self-pay

## 2023-07-19 ENCOUNTER — Other Ambulatory Visit: Payer: Self-pay

## 2023-07-19 MED ORDER — ONETOUCH DELICA PLUS LANCET30G MISC: 1 refills | Status: DC

## 2023-07-26 ENCOUNTER — Ambulatory Visit (INDEPENDENT_AMBULATORY_CARE_PROVIDER_SITE_OTHER): Payer: Medicare Other | Admitting: Family Medicine

## 2023-07-26 ENCOUNTER — Encounter: Payer: Self-pay | Admitting: Family Medicine

## 2023-07-26 DIAGNOSIS — G43809 Other migraine, not intractable, without status migrainosus: Secondary | ICD-10-CM

## 2023-07-26 MED ORDER — SUMATRIPTAN SUCCINATE 50 MG PO TABS: 50.0000 mg | ORAL_TABLET | Freq: Once | ORAL | 1 refills | Status: AC

## 2023-07-26 MED ORDER — KETOROLAC TROMETHAMINE 60 MG/2ML IM SOLN
60.0000 mg | Freq: Once | INTRAMUSCULAR | Status: AC
  Administered 2023-07-26: 60 mg via INTRAMUSCULAR

## 2023-07-26 NOTE — Patient Instructions (Addendum)
Follow up as needed or as scheduled TAKE the Sumatriptan (Imitrex) as needed for migraine Drink LOTS of fluids Tylenol or ibuprofen as needed for pain (starting tomorrow) REST!! Call with any questions or concerns Hang in there!!!

## 2023-07-26 NOTE — Progress Notes (Unsigned)
Subjective:    Patient ID: [name], female    DOB: Nov 25, 1955, 67 y.o.   MRN: [id number]  HPI HA- pt has hx of migraines.  Pt reports sxs started last weekend (~10 days ago).  Saw Neurosurg and was started on Meloxicam 15mg  w/o relief.  'i know it's a migraine'.  Denies sensitivity to light or sound.  No nausea.  Pain is frontal.  Has not tried any migraine rescue medication.  Pt reports previously sxs responded to Toradol.     Review of Systems For ROS see HPI     Objective:   Physical Exam        Assessment & Plan:

## 2023-07-29 ENCOUNTER — Telehealth: Payer: Self-pay | Admitting: Family Medicine

## 2023-07-29 DIAGNOSIS — E119 Type 2 diabetes mellitus without complications: Secondary | ICD-10-CM

## 2023-07-29 DIAGNOSIS — E669 Obesity, unspecified: Secondary | ICD-10-CM

## 2023-07-29 MED ORDER — METHOCARBAMOL 500 MG PO TABS: 500.0000 mg | ORAL_TABLET | Freq: Three times a day (TID) | ORAL | 0 refills | Status: DC | PRN

## 2023-07-29 NOTE — Telephone Encounter (Signed)
Prescription for Methocarbamol sent to pharmacy

## 2023-07-29 NOTE — Telephone Encounter (Signed)
Ok to refer back to health weight and wellness

## 2023-07-29 NOTE — Telephone Encounter (Signed)
Pt started Meloxicam Monday evening and has taken it as directed but she's not getting any relief. She stated that something had been discussed during her appt 9/16 about possibly taking Methocarbamol if the Meloxicam didn't work. She is asking if it can be called in for her.  Pharmacy: [facility] [id number] - [place], [place] - [address]

## 2023-07-29 NOTE — Addendum Note (Signed)
Addended by: Eldred Manges on: 07/29/2023 04:17 PM   Modules accepted: Orders

## 2023-07-29 NOTE — Telephone Encounter (Signed)
Pt has been notified but asked if we could re refer her to weight management.  Is it okay to send referral for healthy weight [facility] wellness? They need a new RX in order for her to resume her visits there

## 2023-08-02 ENCOUNTER — Telehealth: Payer: Self-pay | Admitting: Family Medicine

## 2023-08-02 NOTE — Telephone Encounter (Signed)
Noted  

## 2023-08-02 NOTE — Telephone Encounter (Signed)
Patient called and states would like to speak with Thea Silversmith regarding her Jardiance medication.

## 2023-08-02 NOTE — Telephone Encounter (Signed)
Patient will be providing a new application for Jardiance as she is enrolled in a program for financial assistance

## 2023-08-03 LAB — HM DIABETES EYE EXAM

## 2023-08-05 LAB — HM MAMMOGRAPHY

## 2023-08-06 ENCOUNTER — Telehealth: Payer: Self-pay | Admitting: Family Medicine

## 2023-08-06 NOTE — Telephone Encounter (Signed)
Caller name: ONIYA MANDARINO  On DPR?: Yes  Call back number: [phone] (mobile)  Provider they see: Sheliah Hatch, MD  Reason for call:   Foundation that you get your medication free. [facility]) Are you aware of this

## 2023-08-09 ENCOUNTER — Encounter: Payer: Self-pay | Admitting: Family Medicine

## 2023-08-13 ENCOUNTER — Ambulatory Visit: Payer: Medicare Other | Admitting: Family Medicine

## 2023-08-13 ENCOUNTER — Encounter: Payer: Self-pay | Admitting: Family Medicine

## 2023-08-13 VITALS — BP 108/60 | HR 72 | Temp 99.0°F | Ht 69.0 in | Wt 178.4 lb

## 2023-08-13 DIAGNOSIS — E782 Mixed hyperlipidemia: Secondary | ICD-10-CM | POA: Diagnosis not present

## 2023-08-13 DIAGNOSIS — Z23 Encounter for immunization: Secondary | ICD-10-CM | POA: Diagnosis not present

## 2023-08-13 DIAGNOSIS — I10 Essential (primary) hypertension: Secondary | ICD-10-CM

## 2023-08-13 DIAGNOSIS — E119 Type 2 diabetes mellitus without complications: Secondary | ICD-10-CM | POA: Diagnosis not present

## 2023-08-13 NOTE — Patient Instructions (Addendum)
Follow up in 3 months to recheck sugar We'll notify you of your lab results and make any changes if needed Continue to work on healthy diet and regular exercise- you're doing great! Use the Klonopin as needed to take the edge of that steroid injection Call with any questions or concerns HAPPY BIRTHDAY!!!

## 2023-08-13 NOTE — Progress Notes (Signed)
   Subjective:    Patient ID: [name], female    DOB: [birth year], 67 y.o.   MRN: [id number]  HPI DM- chronic problem, on Jardiance 10mg  daily.  UTD on eye exam, foot exam.  Needs microalbumin.  Had a recent steroid injection in her back, 'i feel like I'm going to jump out of my skin'.  Due to the injxn, sugars have been high.  HTN- chronic problem, on Losartan 50mg  daily, Metoprolol XL 50mg .  No CP, SOB, HA's, visual changes, edema.  Hyperlipidemia- On Praluent 150mg  q14 days, Lovaza 2gm BID.  Denies abd pain, N/V.  Review of Systems For ROS see HPI     Objective:   Physical Exam Vitals reviewed.  Constitutional:      General: She is not in acute distress.    Appearance: Normal appearance. She is well-developed. She is not ill-appearing.  HENT:     Head: Normocephalic and atraumatic.  Eyes:     Conjunctiva/sclera: Conjunctivae normal.     Pupils: Pupils are equal, round, and reactive to light.  Neck:     Thyroid: No thyromegaly.  Cardiovascular:     Rate and Rhythm: Normal rate and regular rhythm.     Pulses: Normal pulses.     Heart sounds: Normal heart sounds. No murmur heard. Pulmonary:     Effort: Pulmonary effort is normal. No respiratory distress.     Breath sounds: Normal breath sounds.  Abdominal:     General: There is no distension.     Palpations: Abdomen is soft.     Tenderness: There is no abdominal tenderness.  Musculoskeletal:     Cervical back: Normal range of motion and neck supple.     Right lower leg: No edema.     Left lower leg: No edema.  Lymphadenopathy:     Cervical: No cervical adenopathy.  Skin:    General: Skin is warm and dry.  Neurological:     General: No focal deficit present.     Mental Status: She is alert and oriented to person, place, and time.  Psychiatric:        Mood and Affect: Mood normal.        Behavior: Behavior normal.        Thought Content: Thought content normal.           Assessment & Plan:

## 2023-08-14 LAB — CBC WITH DIFFERENTIAL/PLATELET
Absolute Monocytes: 516 {cells}/uL (ref 200–950)
Basophils Absolute: 12 {cells}/uL (ref 0–200)
Basophils Relative: 0.2 %
Eosinophils Absolute: 0 {cells}/uL — ABNORMAL LOW (ref 15–500)
Eosinophils Relative: 0 %
HCT: 37.4 % (ref 35.0–45.0)
Hemoglobin: 12.1 g/dL (ref 11.7–15.5)
Lymphs Abs: 1230 {cells}/uL (ref 850–3900)
MCH: 30.5 pg (ref 27.0–33.0)
MCHC: 32.4 g/dL (ref 32.0–36.0)
MCV: 94.2 fL (ref 80.0–100.0)
MPV: 10.1 fL (ref 7.5–12.5)
Monocytes Relative: 8.9 %
Neutro Abs: 4043 {cells}/uL (ref 1500–7800)
Neutrophils Relative %: 69.7 %
Platelets: 229 10*3/uL (ref 140–400)
RBC: 3.97 10*6/uL (ref 3.80–5.10)
RDW: 12.5 % (ref 11.0–15.0)
Total Lymphocyte: 21.2 %
WBC: 5.8 10*3/uL (ref 3.8–10.8)

## 2023-08-14 LAB — HEPATIC FUNCTION PANEL
AG Ratio: 2 (calc) (ref 1.0–2.5)
ALT: 19 U/L (ref 6–29)
AST: 17 U/L (ref 10–35)
Albumin: 4.7 g/dL (ref 3.6–5.1)
Alkaline phosphatase (APISO): 19 U/L — ABNORMAL LOW (ref 37–153)
Bilirubin, Direct: 0.1 mg/dL (ref 0.0–0.2)
Globulin: 2.4 g/dL (ref 1.9–3.7)
Indirect Bilirubin: 0.3 mg/dL (ref 0.2–1.2)
Total Bilirubin: 0.4 mg/dL (ref 0.2–1.2)
Total Protein: 7.1 g/dL (ref 6.1–8.1)

## 2023-08-14 LAB — HEMOGLOBIN A1C
Hgb A1c MFr Bld: 5.6 %{Hb} (ref <5.7)
Mean Plasma Glucose: 114 mg/dL
eAG (mmol/L): 6.3 mmol/L

## 2023-08-14 LAB — BASIC METABOLIC PANEL
BUN/Creatinine Ratio: 41 (calc) — ABNORMAL HIGH (ref 6–22)
BUN: 33 mg/dL — ABNORMAL HIGH (ref 7–25)
CO2: 23 mmol/L (ref 20–32)
Calcium: 10 mg/dL (ref 8.6–10.4)
Chloride: 105 mmol/L (ref 98–110)
Creat: 0.81 mg/dL (ref 0.50–1.05)
Glucose, Bld: 81 mg/dL (ref 65–99)
Potassium: 4.2 mmol/L (ref 3.5–5.3)
Sodium: 139 mmol/L (ref 135–146)

## 2023-08-14 LAB — LIPID PANEL
Cholesterol: 145 mg/dL (ref <200)
HDL: 70 mg/dL (ref >=50)
LDL Cholesterol (Calc): 59 mg/dL
Non-HDL Cholesterol (Calc): 75 mg/dL (ref <130)
Total CHOL/HDL Ratio: 2.1 (calc) (ref <5.0)
Triglycerides: 81 mg/dL (ref <150)

## 2023-08-14 LAB — TSH: TSH: 1.33 m[IU]/L (ref 0.40–4.50)

## 2023-08-16 ENCOUNTER — Other Ambulatory Visit: Payer: Self-pay | Admitting: Interventional Cardiology

## 2023-08-19 ENCOUNTER — Telehealth: Payer: Self-pay | Admitting: Family Medicine

## 2023-08-19 NOTE — Telephone Encounter (Signed)
Please advise 

## 2023-08-19 NOTE — Telephone Encounter (Signed)
Pt is calling regarding the foundation who gets her medicine delivered to her. The foundation is requiring an e-script for Jardiance to be sent to [facility] ASAP. Pharmacy can be reached at [phone]. Pt is almost out of her medication so she would like this expedited. Pt even had to email her last approval letter to the foundation.

## 2023-08-19 NOTE — Telephone Encounter (Signed)
I completed this earlier this week and gave it to [place] to fax.

## 2023-08-19 NOTE — Telephone Encounter (Signed)
Does not appear you prescribe this med should we send this to the other Dr?

## 2023-08-20 ENCOUNTER — Telehealth: Payer: Self-pay | Admitting: Physician Assistant

## 2023-08-20 ENCOUNTER — Other Ambulatory Visit: Payer: Self-pay | Admitting: *Deleted

## 2023-08-20 MED ORDER — OMEGA-3-ACID ETHYL ESTERS 1 G PO CAPS: 2.0000 | ORAL_CAPSULE | Freq: Two times a day (BID) | ORAL | 3 refills | Status: DC

## 2023-08-20 NOTE — Telephone Encounter (Signed)
*  STAT* If patient is at the pharmacy, call can be transferred to refill team.   1. Which medications need to be refilled? (please list name of each medication and dose if known)   omega-3 acid ethyl esters (LOVAZA) 1 g capsule   2. Would you like to learn more about the convenience, safety, & potential cost savings by using the [facility]?   3. Are you open to using the [facility] (Type [facility]. ).  4. Which pharmacy/location (including street and city if local pharmacy) is medication to be sent to?  [facility] [id number] - [place], [place] - [address]   5. Do they need a 30 day or 90 day supply?   90 day  Patient stated she is completely out of this medication.  Patient has appointment scheduled on  11/11/23.

## 2023-08-20 NOTE — Telephone Encounter (Signed)
Found the completed one and re faxed to the requested number on the form

## 2023-08-24 MED ORDER — EMPAGLIFLOZIN 10 MG PO TABS: 10.0000 mg | ORAL_TABLET | Freq: Every day | ORAL | 1 refills | Status: DC

## 2023-08-24 NOTE — Telephone Encounter (Signed)
Sent Rx electronically to the requested [facility] pharmacy   Patient has been informed

## 2023-08-24 NOTE — Addendum Note (Signed)
Addended by: Eldred Manges on: 08/24/2023 10:08 AM   Modules accepted: Orders

## 2023-08-24 NOTE — Telephone Encounter (Signed)
Pt states that the Foundation received an approval letter for Jardiance for the pt but they didn't receive an e-script. Pt stated that she only has 1 pill left at this point.

## 2023-08-25 MED ORDER — EMPAGLIFLOZIN 10 MG PO TABS: 10.0000 mg | ORAL_TABLET | Freq: Every day | ORAL | 0 refills | Status: DC

## 2023-08-25 NOTE — Telephone Encounter (Addendum)
Pt is out of her [place], she wants to know if there is any way she could have a few called into a local pharmacy until she can get hers delivered so she doesn't have to go without. Pt would like a return call to let her know [phone]

## 2023-08-25 NOTE — Telephone Encounter (Signed)
Sent and patient has been contacted

## 2023-08-25 NOTE — Telephone Encounter (Signed)
Ok to send Jardiance #30, no refill to local pharmacy

## 2023-08-25 NOTE — Addendum Note (Signed)
Addended by: Eldred Manges on: 08/25/2023 02:00 PM   Modules accepted: Orders

## 2023-08-25 NOTE — Telephone Encounter (Signed)
Pt requesting short supply be sent to local pharmacy as her shipment wont be in for a few days.  Please advise

## 2023-08-30 ENCOUNTER — Other Ambulatory Visit (HOSPITAL_COMMUNITY): Payer: Self-pay

## 2023-09-02 ENCOUNTER — Institutional Professional Consult (permissible substitution): Payer: Medicare Other | Admitting: Neurology

## 2023-09-02 ENCOUNTER — Encounter: Payer: Self-pay | Admitting: Neurology

## 2023-09-04 ENCOUNTER — Encounter (HOSPITAL_BASED_OUTPATIENT_CLINIC_OR_DEPARTMENT_OTHER): Payer: Self-pay

## 2023-09-04 ENCOUNTER — Emergency Department (HOSPITAL_BASED_OUTPATIENT_CLINIC_OR_DEPARTMENT_OTHER)
Admission: EM | Admit: 2023-09-04 | Discharge: 2023-09-04 | Disposition: A | Discharge status: Home / Self Care | Payer: Medicare Other | Attending: Emergency Medicine | Admitting: Emergency Medicine

## 2023-09-04 ENCOUNTER — Other Ambulatory Visit: Payer: Self-pay

## 2023-09-04 DIAGNOSIS — Z7984 Long term (current) use of oral hypoglycemic drugs: Secondary | ICD-10-CM | POA: Insufficient documentation

## 2023-09-04 DIAGNOSIS — D72819 Decreased white blood cell count, unspecified: Secondary | ICD-10-CM | POA: Insufficient documentation

## 2023-09-04 DIAGNOSIS — E162 Hypoglycemia, unspecified: Secondary | ICD-10-CM

## 2023-09-04 DIAGNOSIS — E11649 Type 2 diabetes mellitus with hypoglycemia without coma: Secondary | ICD-10-CM | POA: Diagnosis not present

## 2023-09-04 LAB — COMPREHENSIVE METABOLIC PANEL
ALT: 27 U/L (ref 0–44)
AST: 27 U/L (ref 15–41)
Albumin: 4.5 g/dL (ref 3.5–5.0)
Alkaline Phosphatase: 13 U/L — ABNORMAL LOW (ref 38–126)
Anion gap: 8 (ref 5–15)
BUN: 26 mg/dL — ABNORMAL HIGH (ref 8–23)
CO2: 28 mmol/L (ref 22–32)
Calcium: 9.8 mg/dL (ref 8.9–10.3)
Chloride: 103 mmol/L (ref 98–111)
Creatinine, Ser: 0.72 mg/dL (ref 0.44–1.00)
GFR, Estimated: >60 mL/min (ref >60)
Glucose, Bld: 82 mg/dL (ref 70–99)
Potassium: 3.9 mmol/L (ref 3.5–5.1)
Sodium: 139 mmol/L (ref 135–145)
Total Bilirubin: 0.5 mg/dL (ref 0.3–1.2)
Total Protein: 7.1 g/dL (ref 6.5–8.1)

## 2023-09-04 LAB — CBC WITH DIFFERENTIAL/PLATELET
Abs Immature Granulocytes: 0 10*3/uL (ref 0.00–0.07)
Basophils Absolute: 0 10*3/uL (ref 0.0–0.1)
Basophils Relative: 0 %
Eosinophils Absolute: 0 10*3/uL (ref 0.0–0.5)
Eosinophils Relative: 0 %
HCT: 35.8 % — ABNORMAL LOW (ref 36.0–46.0)
Hemoglobin: 11.9 g/dL — ABNORMAL LOW (ref 12.0–15.0)
Immature Granulocytes: 0 %
Lymphocytes Relative: 38 %
Lymphs Abs: 1.3 10*3/uL (ref 0.7–4.0)
MCH: 30.7 pg (ref 26.0–34.0)
MCHC: 33.2 g/dL (ref 30.0–36.0)
MCV: 92.3 fL (ref 80.0–100.0)
Monocytes Absolute: 0.2 10*3/uL (ref 0.1–1.0)
Monocytes Relative: 7 %
Neutro Abs: 1.8 10*3/uL (ref 1.7–7.7)
Neutrophils Relative %: 55 %
Platelets: 222 10*3/uL (ref 150–400)
RBC: 3.88 MIL/uL (ref 3.87–5.11)
RDW: 13.9 % (ref 11.5–15.5)
WBC: 3.3 10*3/uL — ABNORMAL LOW (ref 4.0–10.5)
nRBC: 0 % (ref 0.0–0.2)

## 2023-09-04 LAB — URINALYSIS, W/ REFLEX TO CULTURE (INFECTION SUSPECTED)
Bacteria, UA: NONE SEEN
Bilirubin Urine: NEGATIVE
Glucose, UA: >1000 mg/dL — AB
Hgb urine dipstick: NEGATIVE
Ketones, ur: NEGATIVE mg/dL
Leukocytes,Ua: NEGATIVE
Nitrite: NEGATIVE
Protein, ur: NEGATIVE mg/dL
Specific Gravity, Urine: 1.024 (ref 1.005–1.030)
pH: 6 (ref 5.0–8.0)

## 2023-09-04 LAB — CBG MONITORING, ED
Glucose-Capillary: 104 mg/dL — ABNORMAL HIGH (ref 70–99)
Glucose-Capillary: 76 mg/dL (ref 70–99)
Glucose-Capillary: 130 mg/dL — ABNORMAL HIGH (ref 70–99)
Glucose-Capillary: 145 mg/dL — ABNORMAL HIGH (ref 70–99)

## 2023-09-04 NOTE — ED Triage Notes (Signed)
She reports skipping her Jardiance yesterday. She resumed it today and was concerned d/t her cbg at home registering at 90. She states her sugar is "normally not that low". She is ambulatory and in no distress. She denies fever, nor any other sign of current illness.

## 2023-09-04 NOTE — ED Notes (Signed)
Pt. Given peanut butter crackers, trail mix, and cheese sticks and diet gingerale.

## 2023-09-04 NOTE — Discharge Instructions (Signed)
It was a pleasure taking care of you here in the emergency department  Your blood sugars have continued to improve.  I would check them more frequently at home.  Keep your follow-up appoint with your PCP on Monday.  Make sure to eat 3 meals and 2 snacks daily consisting of protein and complex carbohydrates.  Return for any new or worsening symptoms.

## 2023-09-04 NOTE — ED Notes (Signed)
Pt. Tolerating oral food and drink with no issues.

## 2023-09-04 NOTE — ED Notes (Addendum)
Pt. states she has to get out of here and doesn't want the blood work. Pt. states he PCP will do on Monday. Brittney PA notified.

## 2023-09-04 NOTE — ED Provider Notes (Signed)
[place] EMERGENCY DEPARTMENT AT [facility] Provider Note   CSN: [id number] Arrival date & time: 09/04/23  1558    History  Chief Complaint  Patient presents with   blood sugar concerns    [name] is a 67 y.o. female here for evaluation of concern for low blood sugar. She states she is typically on Jardiance. She skipped her dose yesterday on accident. She took this morning, did not double up on dose.  She subsequently checked her blood sugar at home and her blood sugar was 90.  She states typically her blood sugars are elevated at home and that he is typically low for her.  She states she has been doing otherwise well.  No recent changes in her medications.  No headache, lightheadedness, dizziness, syncope, chest pain, shortness of breath, nausea or vomiting.  No dysuria.  No changes in bowel movements.  She is post be establishing care with a diabetes educator.  States her diabetes is typically "well-controlled."  Follows with Dr. Beverely Low for PCP  HPI     Home Medications Prior to Admission medications   Medication Sig Start Date End Date Taking? Authorizing Provider  azelastine (ASTELIN) 0.1 % nasal spray PLACE 2 SPRAYS INTO EACH NOSTRIL TWO TIMES A DAY AS DIRECTED 08/22/21   Sheliah Hatch, MD  blood glucose meter kit and supplies KIT Dispense based on patient and insurance preference. Use up to four times daily as directed. 04/15/22   Sheliah Hatch, MD  busPIRone (BUSPAR) 7.5 MG tablet Take 1 tablet (7.5 mg total) by mouth 2 (two) times daily. 04/19/23   Sheliah Hatch, MD  Cholecalciferol (VITAMIN D3) 50 MCG (2000 UT) CAPS Take 2,000 Units by mouth daily.    [provider]  Cholecalciferol 1.25 MG (50000 UT) TABS Take 1 tablet by mouth once a week. 02/13/22   Gweneth Dimitri, MD  CLARAVIS 20 MG capsule Take 20 mg by mouth daily. 07/11/21   [provider]  clonazePAM (KLONOPIN) 2 MG tablet 1 mg 2 (two) times daily.  10/23/15   [provider]  Cyanocobalamin (VITAMIN B-12 PO) Take by mouth.    [provider]  empagliflozin (JARDIANCE) 10 MG TABS tablet Take 1 tablet (10 mg total) by mouth daily before breakfast. Patient not taking: Reported on 08/13/2023 04/23/23   Sheliah Hatch, MD  empagliflozin (JARDIANCE) 10 MG TABS tablet Take 1 tablet (10 mg total) by mouth daily before breakfast. 08/25/23   Sheliah Hatch, MD  fenofibrate (TRICOR) 145 MG tablet Take 1 tablet (145 mg total) by mouth daily. 05/27/23   Sheliah Hatch, MD  fexofenadine (ALLEGRA) 180 MG tablet Take 1 tablet (180 mg total) by mouth daily. 09/01/22   Sheliah Hatch, MD  fluticasone Aleda Grana) 50 MCG/ACT nasal spray SPRAY TWO SPRAYS IN EACH NOSTRIL ONCE DAILY 03/17/22   Sheliah Hatch, MD  gabapentin (NEURONTIN) 600 MG tablet Take 1,200 mg by mouth 2 (two) times daily.     [provider]  glucose blood test strip Use as instructed 06/04/23   Sheliah Hatch, MD  glucose blood test strip Use as instructed DX E11.0 06/09/23   Sheliah Hatch, MD  lamoTRIgine (LAMICTAL) 200 MG tablet Take 200 mg by mouth 2 (two) times daily.    [provider]  Lancets [facility] DELICA PLUS [place]) MISC Use twice daily to test blood sugars as directed Dx- uncontrolled DM II 07/19/23   Sheliah Hatch, MD  losartan (COZAAR) 50 MG tablet Take 1 tablet (50 mg total) by mouth daily. 05/27/23   Sheliah Hatch, MD  LUMIGAN 0.01 % SOLN Place 1 drop into both eyes at bedtime.  10/19/19   [provider]  meloxicam (MOBIC) 15 MG tablet Take 1 tablet (15 mg total) by mouth daily. 10/19/22   Sheliah Hatch, MD  methocarbamol (ROBAXIN) 500 MG tablet Take 1 tablet (500 mg total) by mouth every 8 (eight) hours as needed for muscle spasms. 07/29/23   Sheliah Hatch, MD  metoprolol succinate (TOPROL-XL) 50 MG 24 hr tablet TAKE ONE TABLET BY MOUTH DAILY WITH A MEAL 05/27/23   Sheliah Hatch, MD  montelukast  (SINGULAIR) 10 MG tablet Take 1 tablet (10 mg total) by mouth at bedtime. 05/27/23   Sheliah Hatch, MD  Multiple Vitamin (MULTIVITAMIN WITH MINERALS) TABS tablet Take 1 tablet by mouth daily.    [provider]  OLANZapine (ZYPREXA) 5 MG tablet Take 5 mg by mouth daily as needed.    [provider]  omega-3 acid ethyl esters (LOVAZA) 1 g capsule Take 2 capsules (2 g total) by mouth 2 (two) times daily. Please call [phone] to schedule an overdue appointment for future refill. Thank you. Final attempt. 08/20/23   Sharlene Dory, PA-C  PRALUENT 150 MG/ML SOAJ DIAL AND INJECT 150MG  INTO THE SKIN ONCE EVERY 14 DAYS 01/22/23   Corky Crafts, MD  Pyridoxine HCl (VITAMIN B-6 PO) Take 1 tablet by mouth daily.    [provider]  QUEtiapine (SEROQUEL XR) 400 MG 24 hr tablet Take 400 mg by mouth at bedtime.    [provider]  QUEtiapine (SEROQUEL) 50 MG tablet Take 50 mg by mouth daily. 04/29/22   [provider]  SUMAtriptan (IMITREX) 50 MG tablet Take 1 tablet (50 mg total) by mouth once for 1 dose. May repeat in 2 hours if headache persists or recurs. 07/26/23 07/26/23  Sheliah Hatch, MD  timolol (TIMOPTIC) 0.5 % ophthalmic solution SMARTSIG:In Eye(s) 01/27/22   [provider]  venlafaxine (EFFEXOR-XR) 75 MG 24 hr capsule Take 75 mg by mouth. Take 2 tabs in the am and 1 tab in afternoonn    [provider]     Allergies    Nitrofurantoin monohyd macro, Statins, Macrobid [nitrofurantoin], Alprazolam, Amoxicillin, Prednisone, and Sulfa antibiotics    Review of Systems   Review of Systems  Constitutional: Negative.   HENT: Negative.    Respiratory: Negative.    Cardiovascular: Negative.   Gastrointestinal: Negative.   Genitourinary: Negative.   Musculoskeletal: Negative.   Skin: Negative.   Neurological: Negative.   All other systems reviewed and are negative.  Physical Exam Updated Vital Signs BP 136/83   Pulse 77    Temp 97.9 F (36.6 C) (Oral)   Resp 14   Ht 5' 8.5" (1.74 m)   Wt 81.6 kg   LMP 11/09/2000   SpO2 100%   BMI 26.97 kg/m  Physical Exam Vitals and nursing note reviewed.  Constitutional:      General: She is not in acute distress.    Appearance: She is well-developed. She is not ill-appearing.  HENT:     Head: Atraumatic.  Eyes:     Pupils: Pupils are equal, round, and reactive to light.  Cardiovascular:     Rate and Rhythm: Normal rate.     Pulses: Normal pulses.     Heart sounds: Normal heart sounds.  Pulmonary:  Effort: Pulmonary effort is normal. No respiratory distress.     Breath sounds: Normal breath sounds.  Abdominal:     General: Bowel sounds are normal. There is no distension.     Palpations: Abdomen is soft.  Musculoskeletal:        General: Normal range of motion.     Cervical back: Normal range of motion.  Skin:    General: Skin is warm and dry.  Neurological:     General: No focal deficit present.     Mental Status: She is alert.  Psychiatric:        Mood and Affect: Mood normal.    ED Results / Procedures / Treatments   Labs (all labs ordered are listed, but only abnormal results are displayed) Labs Reviewed  CBC WITH DIFFERENTIAL/PLATELET - Abnormal; Notable for the following components:      Result Value   WBC 3.3 (*)    Hemoglobin 11.9 (*)    HCT 35.8 (*)    All other components within normal limits  COMPREHENSIVE METABOLIC PANEL - Abnormal; Notable for the following components:   BUN 26 (*)    Alkaline Phosphatase 13 (*)    All other components within normal limits  URINALYSIS, W/ REFLEX TO CULTURE (INFECTION SUSPECTED) - Abnormal; Notable for the following components:   Glucose, UA >1,000 (*)    All other components within normal limits  CBG MONITORING, ED - Abnormal; Notable for the following components:   Glucose-Capillary 104 (*)    All other components within normal limits  CBG MONITORING, ED - Abnormal; Notable for the  following components:   Glucose-Capillary 145 (*)    All other components within normal limits  CBG MONITORING, ED  CBG MONITORING, ED    EKG None  Radiology No results found.  Procedures Procedures    Medications Ordered in ED Medications - No data to display  ED Course/ Medical Decision Making/ A&P   67 year old here for evaluation of concern for hypoglycemia.  She checked her blood sugar this morning and it was 90.  Her blood sugars are typically "a lot higher than that."  She denies any recent illnesses.  No recent medication changes.  On arrival her blood sugar was 104.  She has been eating and drinking normally.  When I assessed patient she declines additional blood work.  She will stay for additional fingerstick CBG.  She states she is tired and wants to go home.  Her heart and lungs are clear.  Abdomen soft, nontender.  We discussed risk versus benefit to include seeing if there is an infectious process that is causing her blood sugars to be lower than baseline.  Patient declines further workup.  Repeat CBG 76  Patient states she will follow-up outpatient with PCP on Monday.  Given CBG 76 patient now agreeable for further workup  Labs personally viewed and interpreted:  CBC leukopenia 3.3 Metabolic panel BUN 26 UA glucosuria, no infection  CBG 76---145---130  Patient reassessed.  Blood sugars uptrending.  I encouraged her to eat meals with proteins, fat as well as snacks and check her blood sugar more frequently at home.  She states she has appointment with PCP on Monday.  I encouraged her to keep this, return for any worsening symptoms  The patient has been appropriately medically screened and/or stabilized in the ED. I have low suspicion for any other emergent medical condition which would require further screening, evaluation or treatment in the ED or require inpatient management.  Patient is hemodynamically stable and in no acute distress.  Patient able to  ambulate in department prior to ED.  Evaluation does not show acute pathology that would require ongoing or additional emergent interventions while in the emergency department or further inpatient treatment.  I have discussed the diagnosis with the patient and answered all questions.  Pain is been managed while in the emergency department and patient has no further complaints prior to discharge.  Patient is comfortable with plan discussed in room and is stable for discharge at this time.  I have discussed strict return precautions for returning to the emergency department.  Patient was encouraged to follow-up with PCP/specialist refer to at discharge.                                 Medical Decision Making Amount and/or Complexity of Data Reviewed External Data Reviewed: labs, radiology and notes. Labs: ordered. Decision-making details documented in ED Course.  Risk OTC drugs. Decision regarding hospitalization. Diagnosis or treatment significantly limited by social determinants of health.         Final Clinical Impression(s) / ED Diagnoses Final diagnoses:  Hypoglycemia    Rx / DC Orders ED Discharge Orders     None         Kizzi Overbey A, PA-C 09/04/23 2333    Arby Barrette, MD 09/05/23 1501

## 2023-09-06 ENCOUNTER — Ambulatory Visit: Payer: Medicare Other | Admitting: Family Medicine

## 2023-09-06 ENCOUNTER — Encounter: Payer: Self-pay | Admitting: Family Medicine

## 2023-09-06 VITALS — BP 88/52 | HR 65 | Temp 98.5°F | Ht 68.5 in | Wt 180.0 lb

## 2023-09-06 DIAGNOSIS — I952 Hypotension due to drugs: Secondary | ICD-10-CM | POA: Diagnosis not present

## 2023-09-06 DIAGNOSIS — E119 Type 2 diabetes mellitus without complications: Secondary | ICD-10-CM | POA: Diagnosis not present

## 2023-09-06 NOTE — Progress Notes (Unsigned)
   Subjective:    Patient ID: [name], female    DOB: March 06, 1956, 67 y.o.   MRN: [id number]  HPI DM- pt went to ER on 10/26 b/c blood sugar was in the 70s.  Pt reports that she would eat in hopes of bringing her sugar up and it would temporarily increase and then decrease again.  Pt states she is eating regularly but does admit to skipping her between meal snacks at times.  Last A1C 5.6%  Hypotension- BP is low today.  Was 136/83 in ER.  On 10/4 BP was 108/60.  Reports dizziness- particularly w/ changing positions.  No longer on Metoprolol.  Currently on Losartan 50mg  daily.  Denies CP, SOB, HA's, visual changes.   Review of Systems For ROS see HPI     Objective:   Physical Exam Vitals reviewed.  Constitutional:      General: She is not in acute distress.    Appearance: Normal appearance. She is well-developed. She is not ill-appearing.  HENT:     Head: Normocephalic and atraumatic.  Eyes:     Conjunctiva/sclera: Conjunctivae normal.     Pupils: Pupils are equal, round, and reactive to light.  Neck:     Thyroid: No thyromegaly.  Cardiovascular:     Rate and Rhythm: Normal rate and regular rhythm.     Pulses: Normal pulses.     Heart sounds: Normal heart sounds. No murmur heard. Pulmonary:     Effort: Pulmonary effort is normal. No respiratory distress.     Breath sounds: Normal breath sounds.  Abdominal:     General: There is no distension.     Palpations: Abdomen is soft.     Tenderness: There is no abdominal tenderness.  Musculoskeletal:     Cervical back: Normal range of motion and neck supple.     Right lower leg: No edema.     Left lower leg: No edema.  Lymphadenopathy:     Cervical: No cervical adenopathy.  Skin:    General: Skin is warm and dry.  Neurological:     General: No focal deficit present.     Mental Status: She is alert and oriented to person, place, and time.  Psychiatric:        Mood and Affect: Mood normal.        Behavior: Behavior normal.            Assessment & Plan:   Hypotension- ongoing.  She is off the Metoprolol.  Will decrease Losartan to 1/2 tab daily.  Encouraged increased water intake.  Will also hold Jardiance.  Pt expressed understanding and is in agreement w/ plan.

## 2023-09-06 NOTE — Patient Instructions (Signed)
Follow up in 4 weeks to recheck sugar and blood pressure STOP the Jardiance DECREASE the Losartan to 1/2 tab daily Make sure you are drinking LOTS of water Continue to eat regularly Call with any questions or concerns Hang in there!!!

## 2023-09-09 NOTE — Assessment & Plan Note (Signed)
Chronic problem.  On Praluent and Lovaza w/o difficulty.  Check labs.  Adjust meds prn

## 2023-09-09 NOTE — Assessment & Plan Note (Signed)
Chronic problem.  Pt on Jardiance 10mg  daily w/o difficulty.  UTD on eye exam, foot exam.  Microalbumin ordered but subsequently cancelled b/c pt could not void.  Sugars have been running a bit higher than usual due to recent steroid injxn.  Check labs.  Adjust meds prn

## 2023-09-09 NOTE — Assessment & Plan Note (Signed)
Chronic problem.  Currently on Losartan 50mg  daily and Metoprolol.  Asymptomatic.  Check labs due to use of ARB.  No anticipated med changes at this time

## 2023-09-09 NOTE — Assessment & Plan Note (Signed)
Ongoing issue for pt.  Last A1C 5.6% and she has been having low CBGs.  Went to ER to have this evaluated.  Workup was unremarkable.  Will stop Jardiance as this may be contributing to low blood pressure and low blood sugar.  Pt expressed understanding and is in agreement w/ plan.

## 2023-09-13 ENCOUNTER — Encounter: Payer: Self-pay | Admitting: Family Medicine

## 2023-09-13 ENCOUNTER — Ambulatory Visit: Payer: Medicare Other | Admitting: Family Medicine

## 2023-09-13 VITALS — BP 92/60 | HR 88 | Temp 98.7°F | Ht 68.5 in | Wt 181.1 lb

## 2023-09-13 DIAGNOSIS — E119 Type 2 diabetes mellitus without complications: Secondary | ICD-10-CM

## 2023-09-13 NOTE — Progress Notes (Signed)
   Subjective:    Patient ID: [name], female    DOB: 02-26-1956, 67 y.o.   MRN: [id number]  HPI DM- on 10/28 we decided to stop the Jardiance.  On Saturday CBGs were ranging from 90-131.  Yesterday CBGs were 90-169.  Today CBG 122.  Pt would like a new referral to DM Weight management.  Pt is very anxious about her sugars.  Hypotension- SBP has increased 4 pts and DBP has increased 8 pts since stopping Jardiance.   Review of Systems For ROS see HPI     Objective:   Physical Exam Vitals reviewed.  Constitutional:      General: She is not in acute distress.    Appearance: Normal appearance. She is not ill-appearing.  HENT:     Head: Normocephalic and atraumatic.  Cardiovascular:     Rate and Rhythm: Normal rate and regular rhythm.  Pulmonary:     Effort: Pulmonary effort is normal. No respiratory distress.  Skin:    General: Skin is warm and dry.  Neurological:     Mental Status: She is alert and oriented to person, place, and time. Mental status is at baseline.  Psychiatric:     Comments: Anxious, circular thought process           Assessment & Plan:

## 2023-09-13 NOTE — Assessment & Plan Note (Signed)
Ongoing issue.  Pt again needed reassurance that her sugars were ok.  She is very worried about them being either too low or too high.  Explained that all the readings she gave me today were WNL and she should be very proud of sugars that look like this- particularly since she stopped all medication.  Her BP has also improved since stopping Jardiance.  Encouraged her to eat regularly, not focus on her sugars nearly as much, and try and adjust her anxiety medications.  Referral placed back to Oran Rein to better explain her sugars and her diet.  Pt expressed understanding and is in agreement w/ plan.

## 2023-09-13 NOTE — Patient Instructions (Addendum)
Follow up as needed or as scheduled Your sugars look great!!!  Try not to stress about them!!! Make sure you are eating regularly and drinking LOTS of water They will call you to schedule with Vernona Rieger RESTART the Buspar Call with any questions or concerns Stay Safe!  Stay Healthy! Hang in there!!

## 2023-09-17 ENCOUNTER — Ambulatory Visit: Payer: Medicare Other | Admitting: Family Medicine

## 2023-09-17 ENCOUNTER — Encounter: Payer: Self-pay | Admitting: Family Medicine

## 2023-09-17 ENCOUNTER — Ambulatory Visit: Payer: Medicare Other | Admitting: Dietician

## 2023-09-17 VITALS — BP 140/80 | HR 70 | Temp 98.2°F | Wt 178.8 lb

## 2023-09-17 DIAGNOSIS — E119 Type 2 diabetes mellitus without complications: Secondary | ICD-10-CM

## 2023-09-17 NOTE — Patient Instructions (Signed)
Follow up as needed or as scheduled Make sure you are eating every 2-3 hrs- something with both protein and carbs Make sure you are drinking plenty of water Follow up w/ Vernona Rieger as scheduled Make sure you have snacks with you at all times in case you start to feel bad Call with any questions or concerns Stay Safe!  Stay Healthy! Have a great weekend!!!

## 2023-09-17 NOTE — Progress Notes (Unsigned)
   Subjective:    Patient ID: [name], female    DOB: 1956-02-03, 67 y.o.   MRN: [id number]  HPI 'my blood sugar is all over the place'- pt ate 'a really healthy meal' (chicken, broccoli, brown rice, strawberries) last night and then went to the grocery store and felt 'sweaty and really weird' and CBG was 80.  After eating sugar, CBG went up to 258.  'it wrecks your nerves'.  CBG was 109 this morning.  Pt cancelled her appt w/ Oran Rein (nutrition) today.  Pt reports she starts to feel bad when sugar hits 100.   Review of Systems For ROS see HPI     Objective:   Physical Exam Vitals reviewed.  Constitutional:      General: She is not in acute distress.    Appearance: Normal appearance. She is not ill-appearing.  HENT:     Head: Normocephalic and atraumatic.  Eyes:     Extraocular Movements: Extraocular movements intact.     Conjunctiva/sclera: Conjunctivae normal.  Cardiovascular:     Rate and Rhythm: Normal rate.  Pulmonary:     Effort: Pulmonary effort is normal. No respiratory distress.  Skin:    General: Skin is warm and dry.  Neurological:     General: No focal deficit present.     Mental Status: She is alert and oriented to person, place, and time.  Psychiatric:     Comments: Anxious, almost obsessive about blood sugars           Assessment & Plan:

## 2023-09-21 ENCOUNTER — Telehealth: Payer: Self-pay | Admitting: Family Medicine

## 2023-09-21 NOTE — Telephone Encounter (Signed)
She will call tomorrow and get an appointment if no further bowel movement today

## 2023-09-21 NOTE — Telephone Encounter (Signed)
Pt has been CONSTIPATED not having diarrhea, notes she has tried Myralax but has had minimal bowel movements in the last week

## 2023-09-21 NOTE — Telephone Encounter (Signed)
Caller name: [name]  On DPR?: Yes  Call back number: [phone] (mobile)  Provider they see: Sheliah Hatch, MD  Reason for call:   Pt states she has chronic diarrhea and has been taking OTC and that hasn't worked - wants you to prescribe something stronger -advise

## 2023-09-21 NOTE — Telephone Encounter (Signed)
Recent visit 09/17/23 please advise if you are willing to prescribe medication or if you have other OTC recommendations

## 2023-09-21 NOTE — Telephone Encounter (Signed)
I have seen her very regularly over the past few weeks and she has not mentioned diarrhea.  If this is an ongoing problem and OTC Imodium 2-3x/day is not relieving symptoms, she will need an appt.

## 2023-09-21 NOTE — Assessment & Plan Note (Signed)
Ongoing issue.  This is 2nd discussion this week w/ pt regarding her sugars.  Again reassured her that her reported sugars are all WNL.  Discussed relative hypoglycemia and that she may feel symptomatic with her sugars at these new lower (better) numbers, but that she is not actually low nor at risk for complications.  Encouraged her to f/u w/ nutrition- she canceled her appt today to come here for this appt.  Will continue to follow.

## 2023-10-04 ENCOUNTER — Ambulatory Visit: Payer: Medicare Other | Admitting: Family Medicine

## 2023-10-04 ENCOUNTER — Encounter: Payer: Medicare Other | Attending: Family Medicine | Admitting: Dietician

## 2023-10-04 DIAGNOSIS — E119 Type 2 diabetes mellitus without complications: Secondary | ICD-10-CM | POA: Diagnosis present

## 2023-10-04 NOTE — Progress Notes (Unsigned)
  Diabetes Self-Management Education  Visit Type:  Type 2 Diabetes  Appt. Start Time: 0930 Appt. End Time: 1010  10/04/2023  [name], identified by name and date of birth, is a 67 y.o. female with a diagnosis of Diabetes:  .   ASSESSMENT Patient is here today alone.  She was last seen by this RD on 03/11/2023. She has had some lower numbers 75-95 and has been concerned about these.  Reassured patient that these are normal blood glucose readings.  At times we may have feelings that indicate it is time for a meal. Jardiance has been discontinued due to normal blood glucose. She states that she would like to lose additional weight. She reports periods of increased stress and will overeat crackers. She is walking 2 days per week but not as consistently.  History includes:  Type 2 Diabetes, HTN, HLD, GERD, OSA on c-pap, constipation, GERD, bipolar. A1C 5.6% 08/13/2023 decreased from 5.8% 02/2023, eGFR 74 (05/04/2022), GFR 92, cholesterol 148, HDL 66, LDL 44, Triglycerides 189, vitamin D 20 on 04/09/21 Medications includes vitamin D, vitamin B-12   Weight hx: 181 lbs 10/04/2023 181 lbs 03/11/2023 200 lbs 12/17/2022 194 lbs 09/11/2022 201 lbs 06/08/2022 218 lbs 04/14/2022 219 lbs 06/23/2021 220 lbs 04/21/2021  222 lbs 04/07/2021 231 lbs January 2019 193 lbs 06/2018 207 lbs 11/22/2018   Patient lives with her dog.  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  She worked for [employer] prior to being downsized. She struggles with finances at times. She struggles with standing for long periods of time.   She has gone to a therapist but has not gone for a while.  She states that it helps when she talks with her cousin.  Being alone a lot causes her stress.  She states that she needs more structure. She enjoys playing guitar and singing.  She writes her own songs  24 hour recall: B:  2 scrambled eggs, blueberries S:  yogurt L:  sandwich on keto bread ([place] and swiss) S:  apple and PB D:   chili, rice, black-eyed peas, collards, roasted sweet potato, tortilla chips S:  apple and PB B:  water, 1 cup coffee with powdered coffee mate     Individualized Plan for Diabetes Self-Management Training:   Learning Objective:  Patient will have a greater understanding of diabetes self-management. Patient education plan is to attend individual and/or group sessions per assessed needs and concerns.   Plan:   There are no Patient Instructions on file for this visit.Expected Outcomes:     Education material provided: Meal plan card, My Plate, and Snack sheet  If problems or questions, patient to contact team via:  Phone  Future DSME appointment:

## 2023-10-04 NOTE — Patient Instructions (Addendum)
Great job on all the changes that you have made! Just because it is sugar free doesn't mean that it is better for [place].  Aim for a yogurt that has about 15 grams carbs such as [place] yogurt.  Blood glucose goals:  70-130 fasting   90-150 two hours after starting any meal  Generally a rise of 40-60 points after a meal is normal  A1C Goal:   Less than 7%.  Your last A1C was  Lab Results  Component Value Date   HGBA1C 5.6 08/13/2023     Hypoglycemia is any number less than 70.  If you are less than 70, treat with 1/2 cup juice or regular soda or 15 grams of fast acting sugar and recheck in 15 minutes.  Retreat if it is still low. It is unlikely for you to have problems with this off medications.  Continue to stay active most every day.  Mindfulness:  Consistently scheduled meal - avoid skipping  Choices  Eat slowly  Away from distraction (sitting in kitchen or dining room)  Stop eating when satisfied  Before a snack ask, "Am I hungry or eating for another reason?"   "What can I do instead if I am not hungry?"  Avoid stress eating.  Try to find something every day that brings you joy!

## 2023-10-12 ENCOUNTER — Ambulatory Visit: Payer: Medicare Other | Admitting: Family Medicine

## 2023-10-12 ENCOUNTER — Encounter: Payer: Self-pay | Admitting: Family Medicine

## 2023-10-12 VITALS — BP 138/68 | HR 71 | Temp 97.8°F | Ht 68.5 in | Wt 185.2 lb

## 2023-10-12 DIAGNOSIS — E119 Type 2 diabetes mellitus without complications: Secondary | ICD-10-CM

## 2023-10-12 DIAGNOSIS — M6281 Muscle weakness (generalized): Secondary | ICD-10-CM

## 2023-10-12 DIAGNOSIS — W108XXA Fall (on) (from) other stairs and steps, initial encounter: Secondary | ICD-10-CM | POA: Diagnosis not present

## 2023-10-12 MED ORDER — MELOXICAM 15 MG PO TABS: 15.0000 mg | ORAL_TABLET | Freq: Every day | ORAL | 1 refills | Status: DC

## 2023-10-12 NOTE — Patient Instructions (Addendum)
Follow up in 6 weeks to recheck diabetes/A1C START the Meloxicam daily for 10-14 days and then as needed for pain- take w/ food Use a heating pad for pain relief ADD Tylenol as needed for breakthrough pain USE the Methocarbamol as needed for muscle spasm- definitely before bed Do some gentle stretching to avoid stiffness Call with any questions or concerns Stay Safe!  Stay Healthy! Happy Holidays!!

## 2023-10-12 NOTE — Progress Notes (Signed)
   Subjective:    Patient ID: [name], female    DOB: 01-13-1956, 67 y.o.   MRN: [id number]  HPI 'i had a really bad fall'- occurred on 11/23.  Was climbing up the garage steps and her foot got caught on a new piece of molding.  Had her hands full and was not able to break her fall.  Having pain in L hip and tailbone.  Pain in L knee.  Is taking ibuprofen as needed for pain.  Has methocarbamol available to use but has been afraid to take it.     Review of Systems For ROS see HPI     Objective:   Physical Exam Vitals reviewed.  Constitutional:      General: She is not in acute distress.    Appearance: Normal appearance. She is not ill-appearing.  HENT:     Head: Normocephalic and atraumatic.  Musculoskeletal:        General: Tenderness (TTP over L knee, L lateral hip, and L buttock w/o obvious deformity) present. No swelling or deformity.  Skin:    General: Skin is warm and dry.     Findings: Bruising (over L knee) present.  Neurological:     General: No focal deficit present.     Mental Status: She is alert and oriented to person, place, and time.  Psychiatric:        Mood and Affect: Mood normal.        Behavior: Behavior normal.        Thought Content: Thought content normal.           Assessment & Plan:  Fall on stairs- new.  Occurred while pt was walking up the stairs with her hands full.  Tripped on the step and was not able to break her fall.  Now having pain and bruising of L knee, L lateral hip, and buttock.  No obvious bony deformity.  No gait abnormality.  Likely just sore from impact.  Start scheduled Meloxicam, use Methocarbamol prn.  Pt expressed understanding and is in agreement w/ plan.

## 2023-10-21 ENCOUNTER — Encounter: Payer: Self-pay | Admitting: Pharmacist

## 2023-10-21 NOTE — Progress Notes (Signed)
10/21/2023 Name: [name] MRN: [id number] DOB: 1956/03/02  Chief Complaint  Patient presents with   Medication Management    Jardiance    [name] is a 67 y.o. year old female who presented for a telephone visit.   They were referred to the pharmacist by  PCP  for assistance in managing medication access. She has been followed by pharmacy services since 2021 (original referral)   Subjective:  Patient has received Jardiance and Praluent from medication assistance program in past. She is due to reapply for Jardiance medication assistance program.  I looks like she either gets Praluent with patient assistance program or more likely with a [employer] Grant.    Medication Access/Adherence  Current Pharmacy:  [id number] - [place], [place] - [address] [place] [address] Phone: [phone] Fax: [phone]  [facility] - [place], [place] - [address] [address] [place], [address] [place] [address] Phone: [phone] Fax: [phone]  [facility] - [place], [place] - [address] [place] [address] Phone: [phone] Fax: [phone]   Patient reports affordability concerns with their medications: Yes  Patient reports access/transportation concerns to their pharmacy: No  Patient reports adherence concerns with their medications:  No       Objective:  Lab Results  Component Value Date   HGBA1C 5.6 08/13/2023    Lab Results  Component Value Date   CREATININE 0.72 09/04/2023   BUN 26 (H) 09/04/2023   NA 139 09/04/2023   K 3.9 09/04/2023   CL 103 09/04/2023   CO2 28 09/04/2023    Lab Results  Component Value Date   CHOL 145 08/13/2023   HDL 70 08/13/2023   LDLCALC 59 08/13/2023   LDLDIRECT 74.0 12/16/2021   TRIG 81 08/13/2023   CHOLHDL 2.1 08/13/2023    Medications Reviewed Today     Reviewed by Henrene Pastor, RPH-CPP (Pharmacist) on 10/21/23 at 0858   Med List Status: <None>   Medication Order Taking? Sig Documenting Provider Last Dose Status Informant  azelastine (ASTELIN) 0.1 % nasal spray [id number] No PLACE 2 SPRAYS INTO EACH NOSTRIL TWO TIMES A DAY AS DIRECTED Sheliah Hatch, MD Taking Active   blood glucose meter kit and supplies KIT [id number] No Dispense based on patient and insurance preference. Use up to four times daily as directed. Sheliah Hatch, MD Taking Active   busPIRone (BUSPAR) 7.5 MG tablet [id number] No Take 1 tablet (7.5 mg total) by mouth 2 (two) times daily. Sheliah Hatch, MD Taking Active   Cholecalciferol (VITAMIN D3) 50 MCG (2000 UT) CAPS [id number] No Take 2,000 Units by mouth daily. [provider] Taking Active   Cholecalciferol 1.25 MG (50000 UT) TABS [id number] No Take 1 tablet by mouth once a week. Gweneth Dimitri, MD Taking Active   CLARAVIS 20 MG capsule [id number] No Take 20 mg by mouth daily. [provider] Taking Active   clonazePAM (KLONOPIN) 2 MG tablet [id number] No 1 mg 2 (two) times daily.  [provider] Taking Active            Med Note Adele Schilder   Fri Dec 29, 2019  6:22 PM)    Cyanocobalamin (VITAMIN B-12 PO) [id number] No Take by mouth. [provider] Taking Active   fenofibrate (TRICOR) 145 MG tablet [id number] No Take 1 tablet (145 mg total) by mouth daily. Sheliah Hatch, MD Taking Active   fexofenadine [facility]) 180  MG tablet [id number] No Take 1 tablet (180 mg total) by mouth daily. Sheliah Hatch, MD Taking Active   fluticasone [facility]) 50 MCG/ACT nasal spray [id number] No SPRAY TWO SPRAYS IN EACH NOSTRIL ONCE DAILY Sheliah Hatch, MD Taking Active   gabapentin (NEURONTIN) 600 MG tablet [id number] No Take 1,200 mg by mouth 2 (two) times daily.  [provider] Taking Active Self  glucose blood test strip [id number] No Use as instructed Sheliah Hatch, MD Taking Active   glucose blood test strip [id number] No Use as  instructed DX E11.0 Sheliah Hatch, MD Taking Active   lamoTRIgine (LAMICTAL) 200 MG tablet [id number] No Take 200 mg by mouth 2 (two) times daily. [provider] Taking Active Self  Lancets Letta Pate DELICA PLUS [place]) MISC [id number] No Use twice daily to test blood sugars as directed Dx- uncontrolled DM II Sheliah Hatch, MD Taking Active   losartan (COZAAR) 50 MG tablet [id number] No Take 1 tablet (50 mg total) by mouth daily. Sheliah Hatch, MD Taking Active   LUMIGAN 0.01 % SOLN [id number] No Place 1 drop into both eyes at bedtime.  [provider] Taking Active   meloxicam (MOBIC) 15 MG tablet [id number]  Take 1 tablet (15 mg total) by mouth daily. Sheliah Hatch, MD  Active   methocarbamol (ROBAXIN) 500 MG tablet [id number] No Take 1 tablet (500 mg total) by mouth every 8 (eight) hours as needed for muscle spasms. Sheliah Hatch, MD Taking Active   montelukast (SINGULAIR) 10 MG tablet [id number] No Take 1 tablet (10 mg total) by mouth at bedtime. Sheliah Hatch, MD Taking Active   Multiple Vitamin (MULTIVITAMIN WITH MINERALS) TABS tablet [id number] No Take 1 tablet by mouth daily. [provider] Taking Active   OLANZapine (ZYPREXA) 5 MG tablet [id number] No Take 5 mg by mouth daily as needed. [provider] Taking Active   omega-3 acid ethyl esters (LOVAZA) 1 g capsule [id number] No Take 2 capsules (2 g total) by mouth 2 (two) times daily. Please call [phone] to schedule an overdue appointment for future refill. Thank you. Final attempt. Bunnie Domino Taking Active   PRALUENT 150 MG/ML Ivory Broad [id number] No DIAL AND INJECT 150MG  INTO THE SKIN ONCE EVERY 14 DAYS Corky Crafts, MD Taking Active   Pyridoxine HCl (VITAMIN B-6 PO) [id number] No Take 1 tablet by mouth daily. [provider] Taking Active   QUEtiapine (SEROQUEL XR) 400 MG 24 hr tablet [id number] No Take 400 mg by mouth at bedtime. [provider] Taking Active   QUEtiapine (SEROQUEL) 50 MG tablet [id number] No Take 50 mg by mouth daily. [provider] Taking Active   SUMAtriptan (IMITREX) 50 MG tablet [id number]  Take 1 tablet (50 mg total) by mouth once for 1 dose. May repeat in 2 hours if headache persists or recurs. Sheliah Hatch, MD  Expired 07/26/23 2359   timolol (TIMOPTIC) 0.5 % ophthalmic solution [id number] No SMARTSIG:In Eye(s) [provider] Taking Active   venlafaxine (EFFEXOR-XR) 75 MG 24 hr capsule [id number] No Take 75 mg by mouth. Take 2 tabs in the am and 1 tab in afternoonn [provider] Taking Active Self              Assessment/Plan:  Medication Access:  Assisted patient with Jardiance / [employer] application for 2025. She has been approved to continue to receive Jardiance from medication assistance program thru 11/08/2024  Follow Up Plan: 1 to  2 months  Henrene Pastor, PharmD Clinical Pharmacist [facility]  Population Health [phone]

## 2023-10-28 ENCOUNTER — Other Ambulatory Visit: Payer: Self-pay | Admitting: Physician Assistant

## 2023-10-30 ENCOUNTER — Other Ambulatory Visit: Payer: Self-pay | Admitting: Physician Assistant

## 2023-11-02 ENCOUNTER — Other Ambulatory Visit: Payer: Self-pay

## 2023-11-02 DIAGNOSIS — B0229 Other postherpetic nervous system involvement: Secondary | ICD-10-CM

## 2023-11-02 MED ORDER — FEXOFENADINE HCL 180 MG PO TABS: 180.0000 mg | ORAL_TABLET | Freq: Every day | ORAL | 3 refills | Status: DC

## 2023-11-11 ENCOUNTER — Ambulatory Visit: Payer: Medicare Other | Admitting: Physician Assistant

## 2023-11-22 ENCOUNTER — Ambulatory Visit: Payer: Self-pay | Admitting: Family Medicine

## 2023-11-22 NOTE — Telephone Encounter (Signed)
 3rd attempt to reach patient: no answer: left voicemail for patient.

## 2023-11-22 NOTE — Telephone Encounter (Signed)
 1st attempt, unable to reach pt, left VM to return call to office  Copied from CRM [id number]. Topic: Clinical - Pink Word Triage >> Nov 22, 2023 12:26 PM Antonio H wrote: Reason for Triage: Patient ate some different snacks last night and blood sugar was 167 when she woke up today, she is feeling weak. She doesn't feel like herself, has this nervous feeling. Would like a call back from a nurse to see if this is something to be concerned about. Number is [phone]

## 2023-11-22 NOTE — Telephone Encounter (Signed)
 2nd attempt to call patient: no answer: left voicemail and asked patient to call office back.

## 2023-11-22 NOTE — Telephone Encounter (Signed)
 Called and scheduled pt appt with pt. She reports sugar is 117

## 2023-11-23 ENCOUNTER — Encounter: Payer: Self-pay | Admitting: Family Medicine

## 2023-11-23 ENCOUNTER — Ambulatory Visit: Payer: Medicare Other | Admitting: Family Medicine

## 2023-11-23 VITALS — BP 100/60 | HR 69 | Temp 98.8°F | Ht 68.5 in | Wt 192.0 lb

## 2023-11-23 DIAGNOSIS — E119 Type 2 diabetes mellitus without complications: Secondary | ICD-10-CM | POA: Diagnosis not present

## 2023-11-23 NOTE — Progress Notes (Signed)
   Subjective:    Patient ID: [name], female    DOB: 12-07-1955, 68 y.o.   MRN: [id number]  HPI Diabetes- pt reports 2 nights ago she was eating quite a bit of crackers, cheese, PB.  Woke up later that night feeling 'really dizzy'.  Sugar was 167.  This triggered a panic attack.  Yesterday sugar was 117, today 122.  Pt reports she felt better yesterday and is good today.   Review of Systems For ROS see HPI     Objective:   Physical Exam Vitals reviewed.  Constitutional:      Appearance: Normal appearance.  HENT:     Head: Normocephalic and atraumatic.  Cardiovascular:     Rate and Rhythm: Normal rate and regular rhythm.  Pulmonary:     Effort: Pulmonary effort is normal. No respiratory distress.     Breath sounds: No wheezing or rhonchi.  Skin:    General: Skin is warm and dry.  Neurological:     Mental Status: She is alert and oriented to person, place, and time.  Psychiatric:     Comments: anxious           Assessment & Plan:

## 2023-11-23 NOTE — Patient Instructions (Signed)
 Follow up in 6 weeks to recheck diabetes You're doing great!!!  Try not to stress yourself out! Keep eating a low carb diet and getting regular exercise Call with any questions or concerns Stay Safe!  Stay Healthy! Happy New Year!!

## 2023-11-30 NOTE — Assessment & Plan Note (Signed)
Chronic problem.  Discussed that her elevated CBG was directly correlated w/ the high carb foods she was eating before bed.  Reassured her that even at 167, her sugars were not dangerous and there was no cause to panic.  Told her the goal is to keep sugars between 100-200.  She expressed understanding and felt better after our discussion

## 2023-12-09 ENCOUNTER — Encounter: Payer: Self-pay | Admitting: Family Medicine

## 2023-12-09 ENCOUNTER — Ambulatory Visit: Payer: Medicare Other | Admitting: Family Medicine

## 2023-12-09 VITALS — BP 100/58 | HR 75 | Temp 98.5°F | Ht 68.5 in | Wt 185.4 lb

## 2023-12-09 DIAGNOSIS — J329 Chronic sinusitis, unspecified: Secondary | ICD-10-CM | POA: Diagnosis not present

## 2023-12-09 DIAGNOSIS — R0981 Nasal congestion: Secondary | ICD-10-CM | POA: Diagnosis not present

## 2023-12-09 DIAGNOSIS — B9689 Other specified bacterial agents as the cause of diseases classified elsewhere: Secondary | ICD-10-CM | POA: Diagnosis not present

## 2023-12-09 LAB — POC COVID19 BINAXNOW: SARS Coronavirus 2 Ag: NEGATIVE

## 2023-12-09 LAB — POC INFLUENZA A&B (BINAX/QUICKVUE)
Influenza A, POC: NEGATIVE
Influenza B, POC: NEGATIVE

## 2023-12-09 MED ORDER — DOXYCYCLINE HYCLATE 100 MG PO TABS: 100.0000 mg | ORAL_TABLET | Freq: Two times a day (BID) | ORAL | 0 refills | Status: DC

## 2023-12-09 NOTE — Patient Instructions (Signed)
Follow up as needed or as scheduled START the Doxycycline twice daily- take w/ food Drink LOTS of water Continue your Allegra daily Call with any questions or concerns Hang in there!!

## 2023-12-09 NOTE — Progress Notes (Signed)
   Subjective:    Patient ID: [name], female    DOB: [birth year], 68 y.o.   MRN: [id number]  HPI URI- 'it feels like a sinus infxn'.  Sxs started last week.  +HA, congestion, ear ache.  Sxs are primarily R sided.  R sided maxillary sinus pain.  Taking daily Allegra.  No fever.  Sugars are good.     Review of Systems For ROS see HPI     Objective:   Physical Exam Vitals reviewed.  Constitutional:      General: She is not in acute distress.    Appearance: She is well-developed. She is not ill-appearing.  HENT:     Head: Normocephalic and atraumatic.     Right Ear: Tympanic membrane normal.     Left Ear: Tympanic membrane normal.     Nose: Mucosal edema and congestion present. No rhinorrhea.     Right Sinus: Maxillary sinus tenderness and frontal sinus tenderness present.     Left Sinus: Maxillary sinus tenderness and frontal sinus tenderness present.     Mouth/Throat:     Pharynx: Uvula midline. Posterior oropharyngeal erythema present. No oropharyngeal exudate.  Eyes:     Extraocular Movements: Extraocular movements intact.     Conjunctiva/sclera: Conjunctivae normal.     Pupils: Pupils are equal, round, and reactive to light.  Cardiovascular:     Rate and Rhythm: Normal rate and regular rhythm.     Heart sounds: Normal heart sounds.  Pulmonary:     Effort: Pulmonary effort is normal. No respiratory distress.     Breath sounds: Normal breath sounds. No wheezing.  Musculoskeletal:     Cervical back: Normal range of motion and neck supple.  Lymphadenopathy:     Cervical: No cervical adenopathy.  Skin:    General: Skin is warm and dry.  Neurological:     General: No focal deficit present.     Mental Status: She is alert and oriented to person, place, and time.     Cranial Nerves: No cranial nerve deficit.     Motor: No weakness.     Coordination: Coordination normal.  Psychiatric:        Mood and Affect: Mood normal.        Behavior: Behavior normal.        Thought  Content: Thought content normal.           Assessment & Plan:   Bacterial sinusitis- new.  Pt's sxs and PE consistent w/ dx.  Start Doxy as she is allergic to Amox.  Reviewed supportive care and red flags that should prompt return.  Pt expressed understanding and is in agreement w/ plan.

## 2023-12-17 ENCOUNTER — Telehealth: Payer: Self-pay

## 2023-12-17 NOTE — Telephone Encounter (Signed)
 If it was a bacterial infection, the Doxycycline  should have improved her symptoms.  This leads me to believe you may have a viral illness rather than a bacterial one.  This will improve w/ time and rest- more antibiotics would not be helpful.  If still not feeling well next week, we will need to re-examine in office

## 2023-12-17 NOTE — Telephone Encounter (Signed)
 Copied from CRM [id number]. Topic: Clinical - Medical Advice >> Dec 17, 2023 11:42 AM Farrel B wrote: Reason for CRM: Patient is requesting the provider call in some antibiotics that she was on previously, patient states the sinus infection is not getting any better [phone].

## 2023-12-20 NOTE — Telephone Encounter (Signed)
 Pt states she has is feeling better after the weekend.

## 2023-12-21 ENCOUNTER — Institutional Professional Consult (permissible substitution): Payer: Medicare Other | Admitting: Neurology

## 2023-12-23 ENCOUNTER — Other Ambulatory Visit: Payer: Self-pay | Admitting: Physician Assistant

## 2023-12-27 ENCOUNTER — Other Ambulatory Visit: Payer: Self-pay | Admitting: Neurosurgery

## 2023-12-27 DIAGNOSIS — M5416 Radiculopathy, lumbar region: Secondary | ICD-10-CM

## 2023-12-30 ENCOUNTER — Inpatient Hospital Stay: Admission: RE | Admit: 2023-12-30 | Payer: Medicare Other | Source: Ambulatory Visit

## 2024-01-04 ENCOUNTER — Ambulatory Visit: Payer: Medicare Other | Admitting: Family Medicine

## 2024-01-05 ENCOUNTER — Ambulatory Visit: Payer: Medicare Other | Admitting: Family Medicine

## 2024-01-05 ENCOUNTER — Encounter: Payer: Self-pay | Admitting: Pharmacist

## 2024-01-05 ENCOUNTER — Encounter: Payer: Self-pay | Admitting: Family Medicine

## 2024-01-05 VITALS — BP 120/80 | HR 74 | Temp 97.9°F | Wt 192.0 lb

## 2024-01-05 DIAGNOSIS — T466X5A Adverse effect of antihyperlipidemic and antiarteriosclerotic drugs, initial encounter: Secondary | ICD-10-CM | POA: Diagnosis not present

## 2024-01-05 DIAGNOSIS — E119 Type 2 diabetes mellitus without complications: Secondary | ICD-10-CM | POA: Diagnosis not present

## 2024-01-05 DIAGNOSIS — G720 Drug-induced myopathy: Secondary | ICD-10-CM | POA: Diagnosis not present

## 2024-01-05 LAB — MICROALBUMIN / CREATININE URINE RATIO
Creatinine,U: 22.9 mg/dL
Microalb Creat Ratio: 30.6 mg/g — ABNORMAL HIGH (ref 0.0–30.0)
Microalb, Ur: <0.7 mg/dL (ref 0.0–1.9)

## 2024-01-05 NOTE — Progress Notes (Signed)
   Subjective:    Patient ID: [name], female    DOB: 12-27-1955, 68 y.o.   MRN: [id number]  HPI DM- chronic problem.  Currently off all medication.  Is on ARB for renal protection.  Is walking regularly.  UTD on foot exam, eye exam.  Due for microalbumin.  Last A1C 5.6%  No CP, SOB, HA's, visual changes, abd pain, N/V.  No recent symptomatic lows.  No numbness/tingling of hands/feet.  Statin intolerance- pt has hx of myalgias.  Currently on Praluent for lipid control.  Pt is asking if I could refill her Lovaza.   Review of Systems For ROS see HPI     Objective:   Physical Exam Vitals reviewed.  Constitutional:      General: She is not in acute distress.    Appearance: Normal appearance. She is well-developed. She is not ill-appearing.  HENT:     Head: Normocephalic and atraumatic.  Eyes:     Conjunctiva/sclera: Conjunctivae normal.     Pupils: Pupils are equal, round, and reactive to light.  Neck:     Thyroid: No thyromegaly.  Cardiovascular:     Rate and Rhythm: Normal rate and regular rhythm.     Pulses: Normal pulses.     Heart sounds: Normal heart sounds. No murmur heard. Pulmonary:     Effort: Pulmonary effort is normal. No respiratory distress.     Breath sounds: Normal breath sounds.  Abdominal:     General: There is no distension.     Palpations: Abdomen is soft.     Tenderness: There is no abdominal tenderness.  Musculoskeletal:     Cervical back: Normal range of motion and neck supple.     Right lower leg: No edema.     Left lower leg: No edema.  Lymphadenopathy:     Cervical: No cervical adenopathy.  Skin:    General: Skin is warm and dry.  Neurological:     General: No focal deficit present.     Mental Status: She is alert and oriented to person, place, and time.  Psychiatric:        Mood and Affect: Mood normal.        Behavior: Behavior normal.        Thought Content: Thought content normal.           Assessment & Plan:

## 2024-01-05 NOTE — Patient Instructions (Signed)
 Follow up in 3-4 months to recheck blood pressure, cholesterol, and sugar We'll notify you of your lab results and make any changes if needed Keep up the good work on healthy diet and regular exercise- you look great! Next time you need your Lovaza filled, let me know Call with any questions or concerns Stay Safe!  Stay Healthy! Happy Spring!!

## 2024-01-05 NOTE — Assessment & Plan Note (Signed)
 Ongoing issue.  Pt is not able to tolerate statins but is doing well on Praluent.

## 2024-01-05 NOTE — Progress Notes (Signed)
 Pharmacy Quality Measure Review  Statin Use in Persons with Diabetes (SUPD)  [name] is a 68 yo female. She was identified as failing the measure of Statin Use in Persons with Diabetes (SUPD) medications in 2024.   [name] is taking Praulent. She has received Praluent from [employer] medication assistance program in the past.    She has been intolerant to statin medications in the past. She has tried the following:  Atorvastatin - patient cannot remember side effect Rosuvastatin - took 20mg  in 2017 and 40mg  in 2015 - stopped due to muscle weakness / pain Simvastatin - took from 08/2014 to 09/2015 until she stopped due to leg / muscle pain and feeling sick/nauseated Pravastatin 06/2015 to 09/2015 - not sure why stopped - possibly did not get LDL to goal or muscle pain.  She has also tried these medications for lowering cholesterol in the past:  ezetimibe - cause GI upset  niacin - stopped due to severe flushing.  Repatha - stopped due to cost  Lab Results  Component Value Date   CHOL 145 08/13/2023   HDL 70 08/13/2023   LDLCALC 59 08/13/2023   LDLDIRECT 74.0 12/16/2021   TRIG 81 08/13/2023   CHOLHDL 2.1 08/13/2023   I also noticed that patient's medication list no longer has Jardiance listed - looks like was stopped 10/12/2023 but patient was approved to get Jardiance from medication assistance program thru 11/08/2024.   Lab Results  Component Value Date   HGBA1C 5.6 08/13/2023   Also noted last refill for losartan 50mg  was 08/30/2023 for 90 DS - verified this with [facility].   Assessment:  Hyperlipidemia / ASCVD risk reduction - has been intolerant to other therapies with noted myalgias / muscle adverse effect from rosuvastatin and simvastatin in the past. Tolerating Praulent and LDL currently at goal of < 70.  Type 2 DM - A1c at goal.  Medication Adherence: looks like she is past due to refil losartan, might require update Rx.   Plan:  Hyperlipidemia:  Continue  Praulent 150mg  every 14 days.  Will forward information to PCP (sent message in Epic messenger since patient has an appointment today) regarding SUPD measure to consider exclusion code. However if she no longer requires prescription medication to treat diabetes she will not be included in SUPD measure for 2025.  (Possible exclusion code for myopathy - G72.0 or G72.89) Type 2 DM:  I also called [employer] patient assistance program to make sure patient has not gotten delivery of Jardiance since it was stopped. Last delivery per [facility] Cares program was 09/18/2023 for 90 day supply. Plan to un-enroll her from program after A1c verifies that Jardiance is no longer needed.  Medication Adherence:  Will notify PCP that it looks like an updated Rx for losartan for up to 100 day supply.

## 2024-01-05 NOTE — Assessment & Plan Note (Signed)
 Chronic problem.  UTD on foot exam, eye exam.  Due for microalbumin.  At this point is diet controlled.  Check labs.  Restart meds prn.

## 2024-01-06 ENCOUNTER — Telehealth: Payer: Self-pay

## 2024-01-06 ENCOUNTER — Encounter: Payer: Self-pay | Admitting: Family Medicine

## 2024-01-06 LAB — BASIC METABOLIC PANEL
BUN: 22 mg/dL (ref 6–23)
CO2: 26 meq/L (ref 19–32)
Calcium: 9.4 mg/dL (ref 8.4–10.5)
Chloride: 105 meq/L (ref 96–112)
Creatinine, Ser: 0.79 mg/dL (ref 0.40–1.20)
GFR: 77.96 mL/min (ref >60.00)
Glucose, Bld: 92 mg/dL (ref 70–99)
Potassium: 4.1 meq/L (ref 3.5–5.1)
Sodium: 139 meq/L (ref 135–145)

## 2024-01-06 LAB — HEMOGLOBIN A1C: Hgb A1c MFr Bld: 5.9 % (ref 4.6–6.5)

## 2024-01-06 NOTE — Telephone Encounter (Signed)
 Patient asking if she should get the measles vaccine, Per CDC guidelines Adults who do not have presumptive evidence of immunity should get at least one dose of MMR vaccine. Should we do any kind of Titer to confirm/deny immunity?

## 2024-01-06 NOTE — Telephone Encounter (Signed)
 We should do titers prior to giving a booster to determine if this is even needed.  This actually works out well b/c she had questions regarding her urine results and I would like to see her in office next week to discuss.  (It's easier to talk face to face than via MyChart or phone messages).  When she comes in to discuss her urine results, we can draw her measles titers to determine if she needs a booster

## 2024-01-06 NOTE — Telephone Encounter (Signed)
 Copied from CRM [id number]. Topic: Clinical - Medical Advice >> Jan 05, 2024  4:31 PM Chantha C wrote: Reason for CRM: Patient forgot to ask Dr. Beverely Low during the office visit, if patient needs to ge the measles shot? Please advise and call back [phone]

## 2024-01-06 NOTE — Telephone Encounter (Signed)
 Patient agreed with coming in a week from now to discuss lab results. Patient stated this seemed better to do then over the phone. I have sent to admin to schedule her a week from now on Dr. Rennis Golden schedule. Did discuss with patient about the measles titer and patient verbalized she would love to have this done. She is concerned about the outbreak that is happening and just wants to be proactive with protecting her health.

## 2024-01-10 ENCOUNTER — Ambulatory Visit: Payer: Medicare Other | Admitting: Cardiovascular Disease

## 2024-01-12 ENCOUNTER — Institutional Professional Consult (permissible substitution): Payer: Medicare Other | Admitting: Neurology

## 2024-01-13 ENCOUNTER — Other Ambulatory Visit (HOSPITAL_COMMUNITY): Payer: Self-pay

## 2024-01-13 ENCOUNTER — Telehealth: Payer: Self-pay

## 2024-01-13 ENCOUNTER — Telehealth: Payer: Self-pay | Admitting: Interventional Cardiology

## 2024-01-13 NOTE — Telephone Encounter (Signed)
 Patient called to say they are charging her $90 for medication. Please advise

## 2024-01-13 NOTE — Telephone Encounter (Signed)
 Pt c/o medication issue:  1. Name of Medication:   PRALUENT 150 MG/ML SOAJ    2. How are you currently taking this medication (dosage and times per day)? As written   3. Are you having a reaction (difficulty breathing--STAT)? No   4. What is your medication issue? Pt called in stating she need some help getting this medication. She states she used to get it with patient assistance, please advise.

## 2024-01-13 NOTE — Telephone Encounter (Signed)
 Done, see separate encounter for details

## 2024-01-13 NOTE — Telephone Encounter (Signed)
 Patient Advocate Encounter   The patient was approved for a [employer] grant that will help cover the cost of PRALUENT Total amount awarded, $2,500.  Effective: 12/14/23 - 12/12/24   [id number] GMW:[id number] [id number]   Pharmacy provided with approval and processing information. Patient informed via Dorcas Carrow, CPhT  Pharmacy Patient Advocate Specialist  Direct Number: [phone] Fax: [phone]

## 2024-01-14 ENCOUNTER — Ambulatory Visit
Admission: RE | Admit: 2024-01-14 | Discharge: 2024-01-14 | Disposition: A | Discharge status: Home / Self Care | Payer: Medicare Other | Source: Ambulatory Visit | Attending: Neurosurgery | Admitting: Neurosurgery

## 2024-01-14 DIAGNOSIS — M5416 Radiculopathy, lumbar region: Secondary | ICD-10-CM

## 2024-01-14 NOTE — Telephone Encounter (Signed)
 Spoke to pharmacy. It's ready for pick up for $0.

## 2024-01-19 ENCOUNTER — Encounter: Payer: Self-pay | Admitting: Family Medicine

## 2024-01-19 ENCOUNTER — Ambulatory Visit: Payer: Medicare Other | Admitting: Family Medicine

## 2024-01-19 VITALS — BP 108/64 | HR 89 | Temp 98.0°F | Ht 68.5 in | Wt 187.2 lb

## 2024-01-19 DIAGNOSIS — Z0184 Encounter for antibody response examination: Secondary | ICD-10-CM | POA: Diagnosis not present

## 2024-01-19 DIAGNOSIS — R809 Proteinuria, unspecified: Secondary | ICD-10-CM

## 2024-01-19 NOTE — Patient Instructions (Signed)
 Follow up as needed or as scheduled We'll notify you of your lab results and make any changes if needed Continue the Losartan daily Call with any questions or concerns Stay Safe!  Stay Healthy!

## 2024-01-19 NOTE — Progress Notes (Unsigned)
   Subjective:    Patient ID: [name], female    DOB: 05-09-1956, 68 y.o.   MRN: [id number]  HPI Albuminuria- pt's microalbumin/creatinine ratio is now 30.6  She started Losartan based on lab result note but wanted to talk more about this and understand the mechanism.  Denies CP, SOB, dizziness since starting medication.  Immunity status- pt is concerned about the measles outbreak and would like to know if she's immune.   Review of Systems For ROS see HPI     Objective:   Physical Exam Vitals reviewed.  Constitutional:      General: She is not in acute distress.    Appearance: Normal appearance. She is not ill-appearing.  HENT:     Head: Normocephalic and atraumatic.  Cardiovascular:     Rate and Rhythm: Normal rate and regular rhythm.  Pulmonary:     Effort: Pulmonary effort is normal. No respiratory distress.  Skin:    General: Skin is warm and dry.  Neurological:     General: No focal deficit present.     Mental Status: She is alert and oriented to person, place, and time.  Psychiatric:        Mood and Affect: Mood normal.        Behavior: Behavior normal.        Thought Content: Thought content normal.           Assessment & Plan:  Albuminuria- new.  Pt is now just above that 30 threshold for her microalbumin/Cr ratio.  Reviewed the mechanism of ARB action and how the vasodilation helps protect the kidneys.  Her BP is tolerating the addition of this medication and she is asymptomatic.  No changes at this time.  Immunity status- will get MMR titers and update vaccine if needed

## 2024-01-20 LAB — MEASLES/MUMPS/RUBELLA IMMUNITY
Mumps IgG: 17.2 [AU]/ml
Rubella: 23.6 {index}
Rubeola IgG: >300 [AU]/ml

## 2024-01-21 ENCOUNTER — Ambulatory Visit: Payer: Self-pay | Admitting: Family Medicine

## 2024-01-21 NOTE — Telephone Encounter (Signed)
 Patient has been scheduled for for Monday FYI on symptoms

## 2024-01-21 NOTE — Telephone Encounter (Signed)
  Chief Complaint: ear pain, sinus pain, headache, nasal drainage- medication request Symptoms: see above Frequency: started yesterday- but declared today Pertinent Negatives: Patient denies fever Disposition: [] ED /[] Urgent Care (no appt availability in office) / [] Appointment(In office/virtual)/ []  [place] [facility]/ [] Home Care/ [x] Refused Recommended Disposition /[] [facility]/ []  Follow-up with PCP Additional Notes: Patient advised she needs appointment - patient states this is the same as what she had before- she would like provider to send antibiotic. Patient states she will do phone call with PCP- but would just prefer antibiotic be called to [facility]eter/[place].     Copied from CRM [id number]. Topic: Clinical - Red Word Triage >> Jan 21, 2024  2:10 PM Melissa C wrote: [facility] that prompted transfer to Nurse Triage: patient has a lot of ear pain and pain in sinuses. Wondering if something can be called in to help that has helped in the past such like doxycycline (VIBRA-TABS) 100 MG tablet or something like that. Reason for Disposition  Earache  Answer Assessment - Initial Assessment Questions 1. LOCATION: "Where does it hurt?"      ear pain, sinus pressure-right 2. ONSET: "When did the sinus pain start?"  (e.g., hours, days)      Started low energy yesterday- today ear pain, headache 3. SEVERITY: "How bad is the pain?"   (Scale 1-10; mild, moderate or severe)   - MILD (1-3): doesn't interfere with normal activities    - MODERATE (4-7): interferes with normal activities (e.g., work or school) or awakens from sleep   - SEVERE (8-10): excruciating pain and patient unable to do any normal activities        Mild/moderate- 4/10 4. RECURRENT SYMPTOM: "Have you ever had sinus problems before?" If Yes, ask: "When was the last time?" and "What happened that time?"      Yes- sinus infection-couple months ago 5. NASAL CONGESTION: "Is the nose blocked?" If Yes, ask: "Can you  open it or must you breathe through your mouth?"     Sinus drainage  7. FEVER: "Do you have a fever?" If Yes, ask: "What is it, how was it measured, and when did it start?"      no 8. OTHER SYMPTOMS: "Do you have any other symptoms?" (e.g., sore throat, cough, earache, difficulty breathing)     Ear ache  Protocols used: Sinus Pain or Congestion-A-AH

## 2024-01-23 ENCOUNTER — Other Ambulatory Visit: Payer: Self-pay | Admitting: Physician Assistant

## 2024-01-24 ENCOUNTER — Encounter: Payer: Self-pay | Admitting: Family Medicine

## 2024-01-24 ENCOUNTER — Ambulatory Visit: Admitting: Family Medicine

## 2024-01-24 NOTE — Telephone Encounter (Signed)
 Lab results have been discussed.   Verbalized understanding? Yes  Are there any questions? No

## 2024-01-24 NOTE — Telephone Encounter (Signed)
-----   Message from Neena Rhymes sent at 01/24/2024  9:21 AM EDT ----- You are immune to measles, mumps, and rubella- great news!

## 2024-01-25 ENCOUNTER — Ambulatory Visit: Admitting: Family Medicine

## 2024-01-25 NOTE — Telephone Encounter (Signed)
 Former Pt of Dr. Eldridge Dace. Numerous attempts and cancellations. Should I refill? Please advise.

## 2024-02-08 ENCOUNTER — Ambulatory Visit: Admitting: Podiatry

## 2024-02-08 ENCOUNTER — Ambulatory Visit (INDEPENDENT_AMBULATORY_CARE_PROVIDER_SITE_OTHER)

## 2024-02-08 ENCOUNTER — Encounter: Payer: Self-pay | Admitting: Podiatry

## 2024-02-08 ENCOUNTER — Other Ambulatory Visit: Payer: Self-pay

## 2024-02-08 DIAGNOSIS — M778 Other enthesopathies, not elsewhere classified: Secondary | ICD-10-CM | POA: Diagnosis not present

## 2024-02-08 DIAGNOSIS — M79672 Pain in left foot: Secondary | ICD-10-CM

## 2024-02-08 DIAGNOSIS — E782 Mixed hyperlipidemia: Secondary | ICD-10-CM

## 2024-02-08 DIAGNOSIS — M19072 Primary osteoarthritis, left ankle and foot: Secondary | ICD-10-CM | POA: Diagnosis not present

## 2024-02-08 MED ORDER — MELOXICAM 15 MG PO TABS: 15.0000 mg | ORAL_TABLET | Freq: Every day | ORAL | 0 refills | Status: DC | PRN

## 2024-02-08 MED ORDER — FENOFIBRATE 145 MG PO TABS: 145.0000 mg | ORAL_TABLET | Freq: Every day | ORAL | 1 refills | Status: DC

## 2024-02-08 NOTE — Patient Instructions (Signed)

## 2024-02-09 NOTE — Progress Notes (Signed)
 Subjective:   Patient ID: [name], female   DOB: 68 y.o.   MRN: [id number]   HPI Chief Complaint  Patient presents with   Foot Pain    RM#11 Left foot pain for several months ibuprofen helps some but still having discomfort.No injuries states she has had a couple falls but doesn't remember injuring her foot.   68 year old female presents the office the above concerns.  She states she has had ongoing pain to the left midfoot for the last several months.  She states at times it feels.  Does not see any bruising.  No injuries that she reports that affected her feet.  She denies any recent treatment.  She transferred supportive shoe gear.  She does walk daily with her dog.  She is interested in a steroid injection.   Review of Systems  All other systems reviewed and are negative.  Past Medical History:  Diagnosis Date   Allergic rhinitis    Allergy    Anxiety    Arthritis    R knee, Left Knee   Asthma    Bipolar disorder (HCC)    Cataract    Complication of anesthesia    had bad anxiety after anesthesia   Constipation, chronic    Depression    Diabetes mellitus without complication (HCC)    Dysrhythmia    Palpations occ, takes Tenormin   GERD (gastroesophageal reflux disease)    Glaucoma    Heart murmur    Hx of adenomatous colonic polyps 2008   Hyperlipidemia    Hypertension    MVP (mitral valve prolapse)    Sleep apnea    wears CPAP   Urinary tract infection    frequent, see Urololgist 12/16/2010    Past Surgical History:  Procedure Laterality Date   ABDOMINAL HYSTERECTOMY  2002   no oophorectomy   BACK SURGERY  2001   L5 - S1   CATARACT EXTRACTION     COLONOSCOPY     NASAL SINUS SURGERY  01/2010   x2   TONSILLECTOMY     TOTAL KNEE ARTHROPLASTY  12/23/2011   Procedure: TOTAL KNEE ARTHROPLASTY;  Surgeon: Loreta Ave, MD;  Location: [facility] OR;  Service: Orthopedics;  Laterality: Right;  DR MURPHY WANTS 120 MINUTES FOR SURGERY   TOTAL KNEE ARTHROPLASTY   08/31/2012   Procedure: TOTAL KNEE ARTHROPLASTY;  Surgeon: Loreta Ave, MD;  Location: [facility] OR;  Service: Orthopedics;  Laterality: Left;     Current Outpatient Medications:    azelastine (ASTELIN) 0.1 % nasal spray, PLACE 2 SPRAYS INTO EACH NOSTRIL TWO TIMES A DAY AS DIRECTED, Disp: 30 mL, Rfl: 2   blood glucose meter kit and supplies KIT, Dispense based on patient and insurance preference. Use up to four times daily as directed., Disp: 1 each, Rfl: 0   busPIRone (BUSPAR) 7.5 MG tablet, Take 1 tablet (7.5 mg total) by mouth 2 (two) times daily., Disp: 60 tablet, Rfl: 3   Cholecalciferol (VITAMIN D3) 50 MCG (2000 UT) CAPS, Take 2,000 Units by mouth daily., Disp: , Rfl:    Cholecalciferol 1.25 MG (50000 UT) TABS, Take 1 tablet by mouth once a week., Disp: 4 tablet, Rfl: 2   CLARAVIS 20 MG capsule, Take 20 mg by mouth daily., Disp: , Rfl:    clonazePAM (KLONOPIN) 2 MG tablet, 1 mg 2 (two) times daily. , Disp: , Rfl: 0   Cyanocobalamin (VITAMIN B-12 PO), Take by mouth., Disp: , Rfl:    fenofibrate (TRICOR) 145  MG tablet, Take 1 tablet (145 mg total) by mouth daily., Disp: 90 tablet, Rfl: 1   fexofenadine (ALLEGRA) 180 MG tablet, Take 1 tablet (180 mg total) by mouth daily., Disp: 90 tablet, Rfl: 3   fluticasone (FLONASE) 50 MCG/ACT nasal spray, SPRAY TWO SPRAYS IN EACH NOSTRIL ONCE DAILY, Disp: 16 g, Rfl: 3   gabapentin (NEURONTIN) 600 MG tablet, Take 1,200 mg by mouth 2 (two) times daily. , Disp: , Rfl:    glucose blood test strip, Use as instructed, Disp: 100 each, Rfl: 12   glucose blood test strip, Use as instructed DX E11.0, Disp: 100 each, Rfl: 12   lamoTRIgine (LAMICTAL) 200 MG tablet, Take 200 mg by mouth 2 (two) times daily., Disp: , Rfl:    Lancets (ONETOUCH DELICA PLUS LANCET30G) MISC, Use twice daily to test blood sugars as directed Dx- uncontrolled DM II, Disp: 100 each, Rfl: 1   losartan (COZAAR) 50 MG tablet, Take 1 tablet (50 mg total) by mouth daily., Disp: 30 tablet, Rfl: 6    LUMIGAN 0.01 % SOLN, Place 1 drop into both eyes at bedtime. , Disp: , Rfl:    montelukast (SINGULAIR) 10 MG tablet, Take 1 tablet (10 mg total) by mouth at bedtime., Disp: 90 tablet, Rfl: 1   Multiple Vitamin (MULTIVITAMIN WITH MINERALS) TABS tablet, Take 1 tablet by mouth daily., Disp: , Rfl:    OLANZapine (ZYPREXA) 5 MG tablet, Take 5 mg by mouth daily as needed., Disp: , Rfl:    omega-3 acid ethyl esters (LOVAZA) 1 g capsule, Take 2 capsules (2 g total) by mouth 2 (two) times daily., Disp: 60 capsule, Rfl: 0   PRALUENT 150 MG/ML SOAJ, DIAL AND INJECT 150MG  INTO THE SKIN ONCE EVERY 14 DAYS, Disp: 2 mL, Rfl: 11   Pyridoxine HCl (VITAMIN B-6 PO), Take 1 tablet by mouth daily., Disp: , Rfl:    QUEtiapine (SEROQUEL XR) 400 MG 24 hr tablet, Take 400 mg by mouth at bedtime., Disp: , Rfl:    QUEtiapine (SEROQUEL) 50 MG tablet, Take 50 mg by mouth daily., Disp: , Rfl:    timolol (TIMOPTIC) 0.5 % ophthalmic solution, SMARTSIG:In Eye(s), Disp: , Rfl:    traMADol (ULTRAM) 50 MG tablet, Take by mouth., Disp: , Rfl:    venlafaxine (EFFEXOR-XR) 75 MG 24 hr capsule, Take 75 mg by mouth. Take 2 tabs in the am and 1 tab in afternoonn, Disp: , Rfl:    doxycycline (VIBRA-TABS) 100 MG tablet, Take 1 tablet (100 mg total) by mouth 2 (two) times daily. (Patient not taking: Reported on 02/08/2024), Disp: 14 tablet, Rfl: 0   meloxicam (MOBIC) 15 MG tablet, Take 1 tablet (15 mg total) by mouth daily as needed for pain., Disp: 30 tablet, Rfl: 0   methocarbamol (ROBAXIN) 500 MG tablet, Take 1 tablet (500 mg total) by mouth every 8 (eight) hours as needed for muscle spasms. (Patient not taking: Reported on 02/08/2024), Disp: 30 tablet, Rfl: 0   SUMAtriptan (IMITREX) 50 MG tablet, Take 1 tablet (50 mg total) by mouth once for 1 dose. May repeat in 2 hours if headache persists or recurs., Disp: 10 tablet, Rfl: 1  Allergies  Allergen Reactions   Nitrofurantoin Monohyd Macro Nausea Only    Severe headache   Statins Other (See  Comments)    Severe weakness, pain   Macrobid [Nitrofurantoin]     Make her feel crazy   Alprazolam Anxiety and Other (See Comments)    Hyper    Amoxicillin Rash  Prednisone Anxiety   Sulfa Antibiotics Nausea And Vomiting          Objective:  Physical Exam  General: AAO x3, NAD  Dermatological: Skin is warm, dry and supple bilateral.  There are no open sores, no preulcerative lesions, no rash or signs of infection present.  Vascular: Dorsalis Pedis artery and Posterior Tibial artery pedal pulses are 2/4 bilateral with immedate capillary fill time.  There is no pain with calf compression, swelling, warmth, erythema.   Neruologic: Grossly intact via light touch bilateral.   Musculoskeletal: There is tenderness palpation on the dorsal medial aspect of the left foot along the first, second TMT.  Flexor, extensor tendons appear to be intact.  There is no area pinpoint tenderness otherwise.  Some trace edema present there is no erythema or warmth.  Gait: Unassisted, Nonantalgic.       Assessment:   Capsulitis, arthritis left foot     Plan:  -Treatment options discussed including all alternatives, risks, and complications -Etiology of symptoms were discussed -X-rays were obtained and reviewed with the patient.  3 views left foot were obtained.  No evidence of acute fracture.  Arthritic changes present along the first, second TMT. -We discussed shoes, good support.  She wants to proceed with a steroid injection.  Clean skin with alcohol.  Mixture of 0.75  cc Phosphate and 0.75 cc Marcaine Plain Was into the Area of Maximal Tenderness (intermediate joint) without Complications.  Postinjection Care Discussed.  Tolerated Well. -Discussed topical medication such as Voltaren gel if not using oral NSAIDs -Prescribed mobic. Discussed side effects of the medication and directed to stop if any are to occur and call the office.   Return if symptoms worsen or fail to improve.  Vivi Barrack DPM

## 2024-02-10 ENCOUNTER — Other Ambulatory Visit: Payer: Self-pay | Admitting: Interventional Cardiology

## 2024-02-14 ENCOUNTER — Ambulatory Visit: Payer: Medicare Other | Admitting: Neurology

## 2024-02-14 ENCOUNTER — Telehealth: Payer: Self-pay | Admitting: Neurology

## 2024-02-14 NOTE — Telephone Encounter (Signed)
 NS x 2 for new problem visit. Please review.

## 2024-02-17 ENCOUNTER — Encounter: Payer: Self-pay | Admitting: Family Medicine

## 2024-02-17 ENCOUNTER — Ambulatory Visit: Admitting: Family Medicine

## 2024-02-17 VITALS — BP 108/72 | HR 75 | Ht 68.5 in | Wt 193.1 lb

## 2024-02-17 DIAGNOSIS — B9689 Other specified bacterial agents as the cause of diseases classified elsewhere: Secondary | ICD-10-CM

## 2024-02-17 DIAGNOSIS — J329 Chronic sinusitis, unspecified: Secondary | ICD-10-CM | POA: Diagnosis not present

## 2024-02-17 MED ORDER — DOXYCYCLINE HYCLATE 100 MG PO TABS: 100.0000 mg | ORAL_TABLET | Freq: Two times a day (BID) | ORAL | 0 refills | Status: DC

## 2024-02-17 NOTE — Patient Instructions (Signed)
 Follow up as needed or as scheduled START the Doxycycline twice daily- take w/ food Drink LOTS of fluids CONTINUE your allergy medication daily Call with any questions or concerns ENJOY YOUR TIME AWAY!!!

## 2024-02-17 NOTE — Progress Notes (Signed)
   Subjective:    Patient ID: [name], female    DOB: [birth year], 68 y.o.   MRN: [id number]  HPI Sinus pain- 'it feels like a sinus infxn'.  Sxs started last week w/ sore throat.  + fatigue, PND.  'a bad HA'.  + frontal HA w/ sinus pressure.  Bilateral ear fullness.  No fevers.  + dry cough.     Review of Systems For ROS see HPI     Objective:   Physical Exam Vitals reviewed.  Constitutional:      General: She is not in acute distress.    Appearance: She is well-developed. She is not ill-appearing.  HENT:     Head: Normocephalic and atraumatic.     Right Ear: Tympanic membrane normal.     Left Ear: Tympanic membrane normal.     Nose: Mucosal edema and congestion present. No rhinorrhea.     Right Sinus: Frontal sinus tenderness present. No maxillary sinus tenderness.     Left Sinus: Frontal sinus tenderness present. No maxillary sinus tenderness.     Mouth/Throat:     Pharynx: Uvula midline. Posterior oropharyngeal erythema present. No oropharyngeal exudate.  Eyes:     Conjunctiva/sclera: Conjunctivae normal.     Pupils: Pupils are equal, round, and reactive to light.  Cardiovascular:     Rate and Rhythm: Normal rate and regular rhythm.     Heart sounds: Normal heart sounds.  Pulmonary:     Effort: Pulmonary effort is normal. No respiratory distress.     Breath sounds: Normal breath sounds. No wheezing.  Musculoskeletal:     Cervical back: Normal range of motion and neck supple.  Lymphadenopathy:     Cervical: No cervical adenopathy.  Skin:    General: Skin is warm and dry.  Neurological:     General: No focal deficit present.     Mental Status: She is alert and oriented to person, place, and time.     Cranial Nerves: No cranial nerve deficit.     Motor: No weakness.     Coordination: Coordination normal.  Psychiatric:        Mood and Affect: Mood normal.        Behavior: Behavior normal.        Thought Content: Thought content normal.            Assessment & Plan:  Bacterial sinusitis- pt has hx of similar.  Start Doxycycline twice daily.  Reviewed supportive care and red flags that should prompt return.  Pt expressed understanding and is in agreement w/ plan.

## 2024-02-18 ENCOUNTER — Telehealth: Payer: Self-pay | Admitting: Podiatry

## 2024-02-18 NOTE — Telephone Encounter (Signed)
 Patient called stating she had a shot from Dr Ardelle Anton and its not working. Patient would like for Dr Ardelle Anton to call her so she talk with him about it.

## 2024-02-21 NOTE — Telephone Encounter (Signed)
error 

## 2024-02-24 ENCOUNTER — Other Ambulatory Visit: Payer: Self-pay | Admitting: Family Medicine

## 2024-02-24 DIAGNOSIS — E782 Mixed hyperlipidemia: Secondary | ICD-10-CM

## 2024-02-24 NOTE — Telephone Encounter (Signed)
 Copied from CRM [id number]. Topic: Clinical - Medication Refill >> Feb 24, 2024 11:33 AM Marlan Silva wrote: Most Recent Primary Care Visit:  Provider: TABORI, KATHERINE E  Department: [facility]  Visit Type: OFFICE VISIT  Date: 02/17/2024  Medication: losartan (COZAAR) 50 MG tablet  Has the patient contacted their pharmacy? Yes (Agent: If no, request that the patient contact the pharmacy for the refill. If patient does not wish to contact the pharmacy document the reason why and proceed with request.) (Agent: If yes, when and what did the pharmacy advise?)  Is this the correct pharmacy for this prescription? Yes If no, delete pharmacy and type the correct one.  This is the patient's preferred pharmacy:  [facility] [id number] - [place], [place] - [address] [place] [address] Phone: [phone] Fax: [phone]  [facility] - [place], [place] - [address] [address] [place], [address] [place] [address] Phone: [phone] Fax: [phone]  [facility] - [place], [place] - [address] [place] [address] Phone: [phone] Fax: [phone]   Has the prescription been filled recently? No  Is the patient out of the medication? Yes  Has the patient been seen for an appointment in the last year OR does the patient have an upcoming appointment? Yes  Can we respond through MyChart? Yes  Agent: Please be advised that Rx refills may take up to 3 business days. We ask that you follow-up with your pharmacy.

## 2024-02-25 ENCOUNTER — Telehealth: Payer: Self-pay | Admitting: Family Medicine

## 2024-02-25 NOTE — Telephone Encounter (Signed)
 Copied from CRM [id number]. Topic: Clinical - Prescription Issue >> Feb 25, 2024  2:43 PM Jethro Morrison wrote: Reason for CRM: PT STATED SHE IS GOING OUT OF TOWN AND SHE IS CONCERNED ABOUT HER BLOOD PRESSURE MEDICINE. ADVISED HER IT WAS SENT OVER AND IT COULD BE MONDAY BEFORE IT IS FILLED, ADVISED HER TO CHECK WITH PHARMACY BEFORE SHE LEAVES AND IF NOT SHE MAYBE ABLE TO TRANSFER IT TO A PHARMACY WHERE SHE WILL BE TRAVELING.

## 2024-02-25 NOTE — Telephone Encounter (Unsigned)
 Copied from CRM [id number]. Topic: Clinical - Medication Refill >> Feb 25, 2024 11:14 AM Palma Bob wrote: Most Recent Primary Care Visit:  Provider: TABORI, KATHERINE E  Department: [facility]  Visit Type: OFFICE VISIT  Date: 02/17/2024  Medication: losartan  (COZAAR ) 50 MG tablet Patient is going out of town and is in need of medication per Ninette Basque from Pharmacist  Has the patient contacted their pharmacy? Yes (Agent: If no, request that the patient contact the pharmacy for the refill. If patient does not wish to contact the pharmacy document the reason why and proceed with request.) (Agent: If yes, when and what did the pharmacy advise?)  Is this the correct pharmacy for this prescription? Yes If no, delete pharmacy and type the correct one.  This is the patient's preferred pharmacy:  [facility] [id number] - [place], [place] - [address] [place] [address] Phone: [phone] Fax: [phone]    Has the prescription been filled recently? Yes  Is the patient out of the medication? Yes  Has the patient been seen for an appointment in the last year OR does the patient have an upcoming appointment? Yes  Can we respond through MyChart? Yes  Agent: Please be advised that Rx refills may take up to 3 business days. We ask that you follow-up with your pharmacy.

## 2024-02-28 MED ORDER — LOSARTAN POTASSIUM 50 MG PO TABS: 50.0000 mg | ORAL_TABLET | Freq: Every day | ORAL | 6 refills | Status: DC

## 2024-02-28 NOTE — Telephone Encounter (Signed)
 Called patient and did inform her that the cozaar  has been sent to the pharmacy, it should be ready for pick up today. Did let patient know to call her pharmacy to check on the medication. Told patient to call if she has any trouble with the receiving the medication

## 2024-03-17 ENCOUNTER — Ambulatory Visit: Admitting: Family Medicine

## 2024-03-17 ENCOUNTER — Encounter: Payer: Self-pay | Admitting: Family Medicine

## 2024-03-17 VITALS — BP 112/64 | HR 89 | Temp 98.0°F | Ht 68.5 in | Wt 195.2 lb

## 2024-03-17 DIAGNOSIS — R635 Abnormal weight gain: Secondary | ICD-10-CM

## 2024-03-17 NOTE — Patient Instructions (Signed)
 Follow up in 1 month to recheck sugar RESTART the Jardiance  daily Keep up the good work on healthy diet and regular exercise- you' re doing great! Call with any questions or concerns Stay Safe!  Stay Healthy! Hang in there!!!

## 2024-03-17 NOTE — Progress Notes (Signed)
   Subjective:    Patient ID: [name], female    DOB: Feb 24, 1956, 68 y.o.   MRN: [id number]  HPI Weight gain- pt has gained 10 lbs since January and 15 lbs in 6 months.  Pt reports her eating has not changed.  Continues to exercise daily- 2 miles/day.  Pt is very frustrated.  She reports when she was on Jardiance , she lost quite a bit of weight and that has returned since stopping the medication.  Has 3 month supply of Jardiance  at home.     Review of Systems For ROS see HPI     Objective:   Physical Exam Vitals reviewed.  Constitutional:      General: She is not in acute distress.    Appearance: Normal appearance. She is not ill-appearing.  HENT:     Head: Normocephalic and atraumatic.  Cardiovascular:     Rate and Rhythm: Normal rate and regular rhythm.  Pulmonary:     Effort: Pulmonary effort is normal. No respiratory distress.     Breath sounds: Normal breath sounds. No wheezing or rhonchi.  Skin:    General: Skin is warm and dry.  Neurological:     General: No focal deficit present.     Mental Status: She is alert and oriented to person, place, and time.  Psychiatric:        Mood and Affect: Mood normal.        Behavior: Behavior normal.        Thought Content: Thought content normal.           Assessment & Plan:   Weight gain- new.  Pt has gained 15 lbs  in last 6 months. She reports diet hasn't changed and she is still walking regularly.  She feels that when she stopped the Jardiance , she gained all the weight she had lost.  Will restart as she has a 3 month supply at home.  Stressed need for low carb diet, continued exercise.  Will follow closely.  Pt expressed understanding and is in agreement w/ plan.

## 2024-03-24 ENCOUNTER — Ambulatory Visit: Admitting: Dietician

## 2024-04-04 ENCOUNTER — Ambulatory Visit: Admitting: Family Medicine

## 2024-04-04 ENCOUNTER — Encounter: Payer: Self-pay | Admitting: Family Medicine

## 2024-04-04 VITALS — BP 108/60 | HR 74 | Temp 97.9°F | Resp 16 | Ht 68.5 in | Wt 195.0 lb

## 2024-04-04 DIAGNOSIS — R42 Dizziness and giddiness: Secondary | ICD-10-CM | POA: Diagnosis not present

## 2024-04-04 DIAGNOSIS — G253 Myoclonus: Secondary | ICD-10-CM | POA: Diagnosis not present

## 2024-04-04 DIAGNOSIS — E119 Type 2 diabetes mellitus without complications: Secondary | ICD-10-CM | POA: Diagnosis not present

## 2024-04-04 MED ORDER — OZEMPIC (0.25 OR 0.5 MG/DOSE) 2 MG/3ML ~~LOC~~ SOPN: 0.2500 mg | PEN_INJECTOR | SUBCUTANEOUS | 1 refills | Status: DC

## 2024-04-04 NOTE — Patient Instructions (Addendum)
 Follow up as scheduled STOP the Jardiance  START the Ozempic once weekly Talk to psych about restarting the Clonazepam - this will help w/ mood, nerves, and muscle twitches Make sure you are drinking LOTS of water and eating regularly to avoid dehydration and low sugar (which can cause dizziness) Make sure to ask Neurology about the muscle twitches and dizziness Call with any questions or concerns Hang in there!!

## 2024-04-04 NOTE — Assessment & Plan Note (Signed)
 Ongoing issue.  Pt would like to start Ozempic in place of Jardiance  since she regained her weight.  Encouraged her to continue to exercise regularly and prescription sent to pharmacy.  Pt expressed understanding and is in agreement w/ plan.

## 2024-04-04 NOTE — Progress Notes (Signed)
   Subjective:    Patient ID: [name], female    DOB: June 04, 1956, 68 y.o.   MRN: [id number]  HPI Spasm- pt reports she is having episodic muscle jerks of arms and legs.  Happening daily, multiple times/day.  Has been going on for 'awhile'.  Psych was attempting to wean Clonazepam  but jerks got worse.  Muscle jerks are less when she takes Klonopin .  Pt is worried about the possibility of tardive dyskinesia.  Denies facial movements.    'my balance has kinda been crazy'- pt reports sxs can occur w/ changing positions but also when sitting still.  Has appt w/ neurology in 2 weeks.  DM- chronic problem.  Pt would like to try Ozempic  rather than Jardiance  due to putting her weight back on.   Review of Systems For ROS see HPI     Objective:   Physical Exam Vitals reviewed.  Constitutional:      General: She is not in acute distress.    Appearance: Normal appearance. She is not ill-appearing.  HENT:     Head: Normocephalic and atraumatic.  Eyes:     Extraocular Movements: Extraocular movements intact.     Conjunctiva/sclera: Conjunctivae normal.  Cardiovascular:     Rate and Rhythm: Normal rate and regular rhythm.  Pulmonary:     Effort: Pulmonary effort is normal. No respiratory distress.  Musculoskeletal:     Cervical back: Neck supple. No rigidity.     Right lower leg: No edema.     Left lower leg: No edema.  Lymphadenopathy:     Cervical: No cervical adenopathy.  Skin:    General: Skin is warm and dry.  Neurological:     General: No focal deficit present.     Mental Status: She is alert and oriented to person, place, and time.     Cranial Nerves: No cranial nerve deficit.     Motor: No weakness.     Coordination: Coordination normal.  Psychiatric:     Comments: anxious           Assessment & Plan:  Dizziness- ongoing issue.  Pt reports good water intake and eating regularly.  Sxs are not consistent- some occur w/ position changes and some while sitting/resting.   Has upcoming appt w/ neuro in 2 weeks- encouraged her to discuss.  Pt expressed understanding and is in agreement w/ plan.   Myoclonus- new.  Pt reports this has been episodic for years and can occur multiple times/day.  Sxs seemed to worsen as psych attempted to wean Klonopin .  Sxs improved as she resumed medication.  Encouraged her to talk to psych about restarting medication and to also discuss muscular sxs w/ Neuro.  Pt expressed understanding and is in agreement w/ plan.

## 2024-04-06 ENCOUNTER — Ambulatory Visit: Payer: Medicare Other | Admitting: Dietician

## 2024-04-07 ENCOUNTER — Other Ambulatory Visit: Payer: Self-pay

## 2024-04-07 MED ORDER — ONETOUCH DELICA PLUS LANCET30G MISC: 1 refills | Status: AC

## 2024-04-17 ENCOUNTER — Ambulatory Visit: Admitting: Family Medicine

## 2024-04-21 ENCOUNTER — Ambulatory Visit: Admitting: Family Medicine

## 2024-04-21 ENCOUNTER — Encounter: Payer: Self-pay | Admitting: Family Medicine

## 2024-04-21 VITALS — BP 148/88 | HR 58 | Temp 98.0°F | Ht 68.4 in | Wt 190.8 lb

## 2024-04-21 DIAGNOSIS — Z7984 Long term (current) use of oral hypoglycemic drugs: Secondary | ICD-10-CM

## 2024-04-21 DIAGNOSIS — E119 Type 2 diabetes mellitus without complications: Secondary | ICD-10-CM | POA: Diagnosis not present

## 2024-04-21 DIAGNOSIS — E782 Mixed hyperlipidemia: Secondary | ICD-10-CM

## 2024-04-21 DIAGNOSIS — I10 Essential (primary) hypertension: Secondary | ICD-10-CM

## 2024-04-21 DIAGNOSIS — S32039A Unspecified fracture of third lumbar vertebra, initial encounter for closed fracture: Secondary | ICD-10-CM | POA: Insufficient documentation

## 2024-04-21 LAB — BASIC METABOLIC PANEL WITH GFR
BUN: 23 mg/dL (ref 6–23)
CO2: 27 meq/L (ref 19–32)
Calcium: 10 mg/dL (ref 8.4–10.5)
Chloride: 104 meq/L (ref 96–112)
Creatinine, Ser: 0.68 mg/dL (ref 0.40–1.20)
GFR: 90.58 mL/min (ref >60.00)
Glucose, Bld: 94 mg/dL (ref 70–99)
Potassium: 4.3 meq/L (ref 3.5–5.1)
Sodium: 138 meq/L (ref 135–145)

## 2024-04-21 LAB — HEPATIC FUNCTION PANEL
ALT: 14 U/L (ref 0–35)
AST: 14 U/L (ref 0–37)
Albumin: 4.7 g/dL (ref 3.5–5.2)
Alkaline Phosphatase: 17 U/L — ABNORMAL LOW (ref 39–117)
Bilirubin, Direct: 0.1 mg/dL (ref 0.0–0.3)
Total Bilirubin: 0.5 mg/dL (ref 0.2–1.2)
Total Protein: 7.1 g/dL (ref 6.0–8.3)

## 2024-04-21 LAB — LIPID PANEL
Cholesterol: 163 mg/dL (ref 0–200)
HDL: 59 mg/dL (ref >39.00)
LDL Cholesterol: 84 mg/dL (ref 0–99)
NonHDL: 103.53
Total CHOL/HDL Ratio: 3
Triglycerides: 99 mg/dL (ref 0.0–149.0)
VLDL: 19.8 mg/dL (ref 0.0–40.0)

## 2024-04-21 LAB — CBC WITH DIFFERENTIAL/PLATELET
Basophils Absolute: 0 10*3/uL (ref 0.0–0.1)
Basophils Relative: 0.3 % (ref 0.0–3.0)
Eosinophils Absolute: 0 10*3/uL (ref 0.0–0.7)
Eosinophils Relative: 0.1 % (ref 0.0–5.0)
HCT: 35.8 % — ABNORMAL LOW (ref 36.0–46.0)
Hemoglobin: 12.2 g/dL (ref 12.0–15.0)
Lymphocytes Relative: 21.5 % (ref 12.0–46.0)
Lymphs Abs: 0.9 10*3/uL (ref 0.7–4.0)
MCHC: 34.1 g/dL (ref 30.0–36.0)
MCV: 90.2 fl (ref 78.0–100.0)
Monocytes Absolute: 0.3 10*3/uL (ref 0.1–1.0)
Monocytes Relative: 7.5 % (ref 3.0–12.0)
Neutro Abs: 2.9 10*3/uL (ref 1.4–7.7)
Neutrophils Relative %: 70.6 % (ref 43.0–77.0)
Platelets: 238 10*3/uL (ref 150.0–400.0)
RBC: 3.97 Mil/uL (ref 3.87–5.11)
RDW: 13.5 % (ref 11.5–15.5)
WBC: 4.1 10*3/uL (ref 4.0–10.5)

## 2024-04-21 LAB — POCT GLYCOSYLATED HEMOGLOBIN (HGB A1C): Hemoglobin A1C: 5.6 % (ref 4.0–5.6)

## 2024-04-21 NOTE — Progress Notes (Unsigned)
   Subjective:    Patient ID: [name], female    DOB: [birth year], 68 y.o.   MRN: [id number]  HPI DM- chronic problem.  Currently on Ozempic  0.25mg  weekly.  Just started yesterday.  No numbness/tingling of hands/feet.  A1C 5.6%  UTD on foot exam, eye exam, microalbumin.  Hyperlipidemia- chronic problem, on Fenofibrate  145mg  daily and Praluent  q14 days.  No abd pain, N/V.  HTN- chronic problem, on Losartan  50mg  daily.  Had recent steroid injxn (6/11) that has elevated her BP today.  No CP, SOB.  Currently has a HA since steroid injxn Wednesday.  No visual changes, edema.   Review of Systems For ROS see HPI     Objective:   Physical Exam Vitals reviewed.  Constitutional:      General: She is not in acute distress.    Appearance: Normal appearance. She is well-developed. She is not ill-appearing.  HENT:     Head: Normocephalic and atraumatic.   Eyes:     Conjunctiva/sclera: Conjunctivae normal.     Pupils: Pupils are equal, round, and reactive to light.   Neck:     Thyroid : No thyromegaly.   Cardiovascular:     Rate and Rhythm: Normal rate and regular rhythm.     Pulses: Normal pulses.     Heart sounds: Normal heart sounds. No murmur heard. Pulmonary:     Effort: Pulmonary effort is normal. No respiratory distress.     Breath sounds: Normal breath sounds.  Abdominal:     General: There is no distension.     Palpations: Abdomen is soft.     Tenderness: There is no abdominal tenderness.   Musculoskeletal:     Cervical back: Normal range of motion and neck supple.     Right lower leg: No edema.     Left lower leg: No edema.  Lymphadenopathy:     Cervical: No cervical adenopathy.   Skin:    General: Skin is warm and dry.   Neurological:     General: No focal deficit present.     Mental Status: She is alert and oriented to person, place, and time.   Psychiatric:        Mood and Affect: Mood normal.        Behavior: Behavior normal.        Thought Content:  Thought content normal.           Assessment & Plan:

## 2024-04-21 NOTE — Patient Instructions (Signed)
 Follow up in 3-4 months to recheck diabetes We'll notify you of your lab results and make any changes if needed Keep up the good work on healthy diet and regular exercise- you look great! Drink LOTS of fluids REST! Call with any questions or concerns Stay Safe!  Stay Healthy! Have a great summer!!!

## 2024-04-22 ENCOUNTER — Other Ambulatory Visit: Payer: Self-pay | Admitting: Physician Assistant

## 2024-04-22 LAB — TSH: TSH: 0.85 u[IU]/mL (ref 0.35–5.50)

## 2024-04-22 NOTE — Assessment & Plan Note (Signed)
 Chronic problem.  On Praluent  and Fenofibrate  w/o difficulty.  Check labs.  Adjust meds prn

## 2024-04-22 NOTE — Assessment & Plan Note (Signed)
 Chronic problem.  On losartan  50mg  daily.  Had steroid injxn in back 2 days ago which has likely caused her HA and also her elevated BP.  No med changes at this time.  Will follow.

## 2024-04-22 NOTE — Assessment & Plan Note (Signed)
 Ongoing issue for pt.  Started Ozempic  yesterday.  A1C is excellent at 5.6%  currently asymptomatic.  Will continue to follow.

## 2024-04-24 ENCOUNTER — Ambulatory Visit: Payer: Self-pay | Admitting: Family Medicine

## 2024-04-24 ENCOUNTER — Ambulatory Visit: Admitting: Family Medicine

## 2024-04-24 NOTE — Progress Notes (Signed)
 Pt has reviewed via MyChart

## 2024-04-27 ENCOUNTER — Ambulatory Visit: Payer: Medicare Other | Admitting: Neurology

## 2024-04-27 ENCOUNTER — Encounter: Payer: Self-pay | Admitting: Neurology

## 2024-04-27 VITALS — BP 107/72 | HR 78 | Ht 69.0 in | Wt 189.0 lb

## 2024-04-27 DIAGNOSIS — G629 Polyneuropathy, unspecified: Secondary | ICD-10-CM

## 2024-04-27 DIAGNOSIS — R2689 Other abnormalities of gait and mobility: Secondary | ICD-10-CM

## 2024-04-27 DIAGNOSIS — G4733 Obstructive sleep apnea (adult) (pediatric): Secondary | ICD-10-CM

## 2024-04-27 DIAGNOSIS — M5416 Radiculopathy, lumbar region: Secondary | ICD-10-CM | POA: Diagnosis not present

## 2024-04-27 DIAGNOSIS — Z9181 History of falling: Secondary | ICD-10-CM

## 2024-04-27 NOTE — Progress Notes (Signed)
 Subjective:    Patient ID: [name] is a 68 y.o. female.  HPI    Melanie Fairy, MD, PhD [facility] [address] [address] [place], [place] [address]  Dear Dr. Paulla Hill,    I saw your patient, [name], upon your kind request, in my neurologic clinic today for evaluation of her lower extremity pain, concern for neuropathy. The patient is unaccompanied today. She missed an appointment on 09/02/2023 and 02/14/2024. I have evaluated her for sleep apnea, gait disorder and paresthesias in the past. As you know, [name] is a 68 year old female with an underlying complex medical history of bipolar disease, reflux disease, hypertension, hyperlipidemia, degenerative spine disease with status post back surgery,, asthma, allergies, Chiari I malformation, anxiety, asthma, cataracts, chronic constipation, diabetes, colonic polyps, mitral valve prolapse, sleep apnea, arthritis with status post bilateral knee replacements, and overweight state, who reports no significant new issues with her legs, does have lower extremity pain and low back pain, reports a fall about 3 weeks ago and has been seeing Dr. Gwendlyn Hill, received a low back injection through his office about a week ago and has noticed some improvement.  She is on high-dose gabapentin  through psychiatry.   I reviewed your office visit note from 01/19/2024.  She was checked for MMR immunity at the time. On 04/21/2024 her A1c was 5.6.  She had a recent fall and reports injury to the back.  She has seen Dr. Gwendlyn Hill.  She had a lumbar spine MRI without contrast on 01/14/2024 and I reviewed the results:  IMPRESSION: 1. Prior laminectomy at L5-S1. 2. Mild canal and foraminal stenosis bilaterally at L4-L5 secondary to disc bulging and facet arthropathy. 3. Mild foraminal stenosis at L2-L3 and L5-S1.    She continues to use her CPAP, compliance data from May through June shows usage of 90% but percent use days greater  than 4 hours at 57, slightly suboptimal, AHI at goal, leak acceptable, pressure of 10 cm with EPR of 3.  Set up date was 04/01/2020.  She admits that sometimes she does not keep her mask on all night.  She tries to hydrate well with water.  She is motivated to continue with her CPAP.  Her A1c on 01/05/2024 was 5.9.  Her BMP was unremarkable at that time.  She has not had a recent vitamin B12 check. I reviewed your office note from 05/27/2023.  Of note, she is on high-dose gabapentin .  She is on several other medications including multiple psychotropic medications.  She had lab work in the recent months, in June 2024 and October 2024 and I reviewed the results: CBC, liver panel, TSH, lipid panel, and BMP were benign, with the exception of a mildly elevated BUN at 33.  TSH was normal at 1.33.  A1c was 5.6.  Her vitamin B12 level in June 2024 was 779, folate 13.9.  She had a mildly below normal hemoglobin at 11.5 and hematocrit below normal at 34.2 at the time.  She had a brain MRI with and without contrast on 03/17/2023 and I reviewed the results:     IMPRESSION: 1. No acute intracranial abnormality. Unchanged appearance of the brain. 2. Chiari I malformation.   Previously:   02/08/2023: 68 year old female with an underlying complex medical history of asthma, allergies, bipolar disorder, reflux disease, hypertension, hyperlipidemia, mitral valve prolapse, sleep apnea, and overweight state, who reports intermittent balance problems several months ago. She has improved in the past 2 to 3 months  and actually has been back to baseline she feels.  She reports that she felt that her equilibrium was off, things were moving but no actual spinning sensation, no nausea or vomiting, no sudden onset of severe headaches, no one-sided weakness or numbness or tingling or droopy face or slurring of speech, did not have any falls.  I reviewed your office note from 10/29/2022.  She reported feeling off balance. She was  encouraged to stay well-hydrated and to stay active physically.   She has not had any recent falls.  She is scheduled for a brain MRI with and without contrast later this month, she was scheduled for 02/15/2023 but called and rescheduled this appointment because she also has an upcoming appointment for lower back injection.  She reports that she is allergic to prednisone, under allergies it says that she reacts with anxiety.  She is worried about her lower back injection but is advised to talk to them directly about her concerns and what they actually use for the injection.      Of note, she is on multiple medications including psychotropic medications including clonazepam  1 mg twice daily, Valium  as needed for [name], Lamictal , Zyprexa  as needed, Seroquel  50 mg once daily as needed and 400 mg at bedtime long-acting.  She also takes a fairly high dose of venlafaxine .  She reports fair compliance with her CPAP, I reviewed her latest compliance data from 01/05/2023 through 02/03/2023, she used her machine 24 out of 30 days with percent use days greater than 4 hours at 63%, indicating mildly suboptimal compliance with an average usage for days on treatment of 6 hours and 28 minutes, residual AHI at goal at 2/h, pressure of 10 cm with EPR of 3.  Leak acceptable with some fluctuation, 95th percentile at 18.7 L/min.  She hydrates well with water, she limits her caffeine, does not drink any daily caffeine, does not drink any alcohol, she is a non-smoker.  She has no problems hearing, has intermittent tinnitus on the right but this is not a new issue for her.       09/08/21: 68 year old right-handed woman with an underlying complex medical history of hypertension, hyperlipidemia, mitral valve prolapse, sleep apnea, allergies, arthritis, asthma, anxiety, depression, with a diagnosis of bipolar disorder, followed by psychiatry, reflux disease, Chiari malformation, and obesity, who reports approximately 59-month history of  intermittent pins-and-needles sensation affecting the upper body, not the face, different places including her arms and legs, independently, lasts briefly for a few seconds, is not actually pacing home is annoying.  She reports that she was diagnosed with shingles.  She had a rash that started in early September and was located left distal anterior leg.  She has a little bit of the remnant as an scarring of her skin.  She did not have any blisters.  No widespread vesicular rash.  She reports that she was vaccinated for shingles.  She was treated with Valtrex  1000 mg 3 times daily for a week, she completed the course.  Earlier this month she filled a refill on the Valtrex  but has not actually taken it.  She has been on gabapentin  per psychiatry at 600 mg 4 times daily but this was increased to a current dose of 1200 mg 3 times daily.   Hydroxyzine  was also added at 5 to 10 mg 3 times daily as needed. Of note, per psychiatry, she is currently on Zyprexa  5 mg daily, Seroquel  400 mg at bedtime, clonazepam  2 mg strength 1 pill  twice daily, Effexor  75 mg strength 2 pills in the morning and 1 in the afternoon, Lamictal  200 mg twice daily.   She had a cervical spine MRI without contrast as well as lumbar spine MRI with and without contrast under Dr. Gwendlyn Hill on 12/13/2020 with I reviewed the results:   IMPRESSION: 1. Findings consistent with Chiari 1 malformation with crowding of the foramen magnum as seen on MRI of the brain performed December 01, 2020. 2. Multilevel degenerative changes of the cervical spine as described above with mild spinal canal stenosis and severe right neural foraminal narrowing at C5-C6. 3. Postsurgical changes from left laminectomy at L5-S1. No significant peridural fibrosis. 4. No high-grade spinal canal stenosis at any level.   She had a brain MRI with and without contrast on 12/01/2020 and I reviewed the results:   IMPRESSION: 1. Chiari I malformation with greatly progressive  cerebellar tonsillar ectopia since 2006. 2. Otherwise unremarkable appearance of the brain for age.   She reports that the increase in gabapentin  did not seem to make a difference.  She would rather go back to the 600 mg 4 times daily that she was per psychiatry.  She is not noticing any obvious alleviating or positional factors or aggravating factors.  She is not in constant pain, she denies any numbness.  She has not fallen recently but does report that he twisted her left ankle about 2 months ago and had seen orthopedics for this, she was told that she had a nondisplaced fracture of the left ankle and she was given a brace.   She reports compliance with her CPAP.  I was able to review her CPAP compliance from 08/09/2021 through 09/07/2021, which is a total of 30 days, during which time she used her machine 28 days with percent use days greater than 4 hours at 83%, indicating very good compliance, average usage of 6 hours and 54 minutes, residual AHI at goal at 3/h, leak on the higher side with a 95th percentile at 25.8 L/min on a pressure of 10 cm with EPR of 3.  Her DME company is [employer].     She reports that she tries to hydrate well but she does have significant mouth dryness.  She reports that she takes Seroquel  400 mg at bedtime and 50 mg as needed.     She had a home sleep test on 03/11/2020 which showed an AHI of 14.7/h, O2 nadir 86%.  She had a follow-up appointment subsequently with Johny Nap, nurse practitioner on 06/11/2020, at which time she was compliant with her CPAP of 11 cm.     02/13/20: 68 year old right-handed woman with an underlying medical history of bipolar disorder, obesity, mitral valve prolapse, hyperlipidemia, allergic rhinitis, palpitations, constipation, heart murmur, chronic cough, reflux disease, anxiety, and low back pain status post back surgery in 2001, arthritis, status post left total knee arthroplasty and right total knee arthroplasty, tonsillectomy, and  hysterectomy, and status post nasal surgeries twice, who reports Feeling sleepy during the day.  Of note, she is on multiple potentially sedating medications.  She is followed by psychiatry.  In particular, she is on high-dose clonazepam , 2 mg strength, half a pill up to 4 times a day, she is also on Seroquel  long-acting and Seroquel  immediate release, Effexor  XR high-dose, 225 mg daily, Zyprexa  10 mg strength, Robaxin , Lamictal , high-dose gabapentin  600 mg 4 times a day.  She reports that she gets sleepy while driving.  She has a friend that she likes to  visit in [place] and she has felt like she is hypnotized while driving and has veered out of her lane.  She is strongly and repeatedly discouraged from driving while sleepy and to address her medication regimen with her psychiatrist as soon as possible.  In the meantime, I have strongly discouraged her from driving any longer distances, such as to [place].    I have followed her in the past for sleep apnea. She was last seen over 4 years ago.  I reviewed her CPAP compliance data from 01/12/2020 through 02/10/2020 which is a total of 30 days, during which time she used her machine 27 days with percent used days greater than 4 hours at 87%, indicating very good compliance with an average usage of 8 hours and 28 minutes, residual AHI at goal at 3.8/h, leak on the high side with a 95th percentile at 28.4 L/min on a pressure of 11 cm with EPR of 3.  She reports that the machine is old and she would like to get a new machine.  According to our records, her set up date was 01/28/2015.  She is a non-smoker.  She drinks alcohol rarely.  She drinks caffeine in the form of coffee, 2 cups/day on average.  She gets her supplies from [employer].  Bedtime is around 11 PM or midnight and rise time around 8.  She denies any night to night nocturia or recurrent morning headaches.  She lives alone, she has a small dog in the household. Her Epworth sleepiness score is 6/24, fatigue  severity score is 40/63.        02/10/2016: I reviewed her CPAP compliance data from 12/13/2015 through 01/11/2016 which is a total of 30 days during which time she used her machine 17 days with percent used days greater than 4 hours at 53%, indicating suboptimal compliance with an average usage of 4 hours and 10 minutes only for all days. Residual AHI 1.9 per hour, leak acceptable with the 95th percentile at 15.2 L/m on a pressure of 11 cm with EPR of 3.   I reviewed her CPAP compliance data from 10/13/2015 through 11/11/2015 which is a total of 30 days during which time she used her machine 21 days with percent used days greater than 4 hours at 63%, indicating suboptimal compliance with an average usage of 4 hours and 36 minutes, residual AHI 3 per hour, leak acceptable with the 95th percentile at 8.5 L/m on a pressure of 11 cm with EPR of 3.    I reviewed her CPAP compliance data from 09/01/2015 through 09/30/2015 which is a total of 30 days during which time she used her machine 23 days with percent used days greater than 4 hours at 67% only, indicating suboptimal compliance with an average usage of 4 hours and 55 minutes, residual AHI borderline at 5.1 per hour, leak acceptable with the 95th percentile at 10.4 L/m on a pressure of 11 cm with EPR of 3.     She reports that she needs supplies. She moved to a smaller townhome recently and lost her mask and other parts of her headgear. She noticed worsening of her daytime somnolence, mood irritability and memory issues since stopping CPAP for the past month or so. She is motivated to get back on treatment. She admits that she thought she would do okay without treatment. Weight has been fairly stable.    I saw her on saw her on 03/27/2015, at which time she reported feeling better. She  was feeling better rested. She did have more stress and was worrying about her brother. She was motivated to try to lose weight and exercise more. Overall, she felt that  she was doing better and her morning headaches had also improved as well as her nocturia. She was compliant with treatment.     I first met her on 09/13/2014 at the request of her primary care physician. At which time the patient reported excessive daytime somnolence and snoring. I invited her back for sleep study. She had a baseline sleep study, followed by a CPAP titration study and I went over her test results with her in detail today. Her baseline sleep study from 11/27/2014 showed a sleep efficiency of 77.7%. Sleep latency was prolonged. Wake after sleep onset was 39.5 minutes with mild to moderate sleep fragmentation. She had an increased percentage of stage I and stage II sleep, absence of slow-wave sleep and absence of REM sleep. She had moderate to loud snoring. Total AHI was 5.8 per hour, rising to 15.8 per hour in the supine position. Average oxygen saturation was only 90%. Nadir was 77%. Based on her significant oxygen desaturations I invited her back for CPAP titration study. Of note, the absence of REM sleep with most likely have underestimated her obstructive sleep apnea. Her CPAP titration study from 01/08/2015 showed a sleep efficiency of 93.6%, latency to sleep was 0 minutes and wake after sleep onset was 33.5 minutes with mild sleep fragmentation noted. She had a normal arousal index. She had a absence of slow-wave sleep and a normal percentage of REM sleep with a long REM latency. She had no significant PLMS. Average oxygen saturation was 92%, nadir was 84%. She did have evidence of central apneas upon starting with CPAP. CPAP was titrated from 5-11 cm. I prescribed CPAP therapy for home use. I did suggest that she should have a pulse oximetry test while on CPAP once established on treatment.   I reviewed her CPAP compliance data from 02/23/2015 through 03/24/2015 which is a total of 30 days during which time she used her machine 25 days with percent used days greater than 4 hours at 77%,  indicating good compliance with an average usage of 6 hours and 11 minutes. Residual AHI acceptable at 3.3 per hour and 95th percentile of leak at 12 L/m on a pressure of 11 cm with EPR of 3.   I reviewed her CPAP compliance data from 02/12/2015 through 03/13/2015 which is a total of 30 days during which time she used her machine only 22 days with percent used days greater than 4 hours at 73%, indicating adequate compliance with an average usage of 5 hours and 41 minutes. Residual AHI acceptable at 3.4 per hour and 95th percentile of leak at 14.7 L/m on a pressure of 11 cm with EPR of 3.   She goes to [facility] counseling [facility] for her mood d/o. She is on multiple sedating medications, but has been told she snores. She has snored for 4 years or so. She does not wake up rested. She wakes up with a headache. She denies nocturia. She has no FHx of OSA. She has had intermittent RLS symptoms and twitches her legs in her sleep. She has dozed off driving more than once and veered out of the lane, but thankfully has had no MVA.   She drinks 2 cups of coffee per day. She does not smoke and very rarely drinks alcohol.   She has 2 cats, who occasionally  are in the bed, but don't bother her. She does not watch TV in bed. She mostly sleeps alone. She is divorced and has no children.    Her typical bedtime is reported to be around 10 PM and usual wake time is around 7 AM. Sleep onset typically occurs within minutes. She reports feeling poorly rested upon awakening. She wakes up on an average 3 times in the middle of the night and has to go to the bathroom 0 to 1 times on a typical night.    She reports excessive daytime somnolence (EDS) and Her Epworth Sleepiness Score (ESS) is 9/24 today.   The patient has not been taking a planned nap.   Snoring is reportedly marked, and associated with choking sounds and witnessed apneas. The patient denies a sense of choking or strangling feeling. The patient has noted any RLS  symptoms and is known to kick while asleep or before falling asleep. There is no family history of RLS or OSA.   She is a restless sleeper and in the morning, the bed is quite disheveled.    She denies cataplexy, sleep paralysis, hypnagogic or hypnopompic hallucinations, or sleep attacks. She does not report any vivid dreams, nightmares, dream enactments, or parasomnias, such as sleep talking or sleep walking. The patient has not had a sleep study or a home sleep test. Her bedroom is usually dark and cool. She uses a box fan.       Her Past Medical History Is Significant For: Past Medical History:  Diagnosis Date   Allergic rhinitis    Allergy    Anxiety    Arthritis    R knee, Left Knee   Asthma    Bipolar disorder (HCC)    Cataract    Complication of anesthesia    had bad anxiety after anesthesia   Constipation, chronic    Depression    Diabetes mellitus without complication (HCC)    Dysrhythmia    Palpations occ, takes Tenormin    GERD (gastroesophageal reflux disease)    Glaucoma    Heart murmur    Hx of adenomatous colonic polyps 2008   Hyperlipidemia    Hypertension    MVP (mitral valve prolapse)    Sleep apnea    wears CPAP   Urinary tract infection    frequent, see Urololgist 12/16/2010    Her Past Surgical History Is Significant For: Past Surgical History:  Procedure Laterality Date   ABDOMINAL HYSTERECTOMY  2002   no oophorectomy   BACK SURGERY  2001   L5 - S1   CATARACT EXTRACTION     COLONOSCOPY     NASAL SINUS SURGERY  01/2010   x2   TONSILLECTOMY     TOTAL KNEE ARTHROPLASTY  12/23/2011   Procedure: TOTAL KNEE ARTHROPLASTY;  Surgeon: Ferd Householder, MD;  Location: [facility] OR;  Service: Orthopedics;  Laterality: Right;  DR MURPHY WANTS 120 MINUTES FOR SURGERY   TOTAL KNEE ARTHROPLASTY  08/31/2012   Procedure: TOTAL KNEE ARTHROPLASTY;  Surgeon: Ferd Householder, MD;  Location: [facility] OR;  Service: Orthopedics;  Laterality: Left;    Her Family History Is Significant  For: Family History  Problem Relation Age of Onset   Lung disease Mother        Mycobacterium avium intracellulare   Coronary artery disease Father    Heart attack Father 19   Heart disease Father    Hypertension Brother    Hyperlipidemia Brother    Alcohol abuse Brother  Anxiety disorder Brother    Healthy Brother    Diabetes Maternal Grandmother    Stroke Maternal Grandmother    Stroke Maternal Grandfather    Heart attack Paternal Grandmother    Anesthesia problems Neg Hx    Colon cancer Neg Hx    Esophageal cancer Neg Hx    Rectal cancer Neg Hx    Stomach cancer Neg Hx     Her Social History Is Significant For: Social History   Socioeconomic History   Marital status: Single    Spouse name: Not on file   Number of children: 0   Years of education: 13   Highest education level: Associate degree: academic program  Occupational History   Occupation: retired//disability    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 No kids. Grew up in [place]. Had a great childhood. Has 2 older brothers.   Mom was a stay at home mom. Her dad worked at [employer] as a Production manager. He died when pt was 68yo.    Never abused.       Disabled d/t Bipolar since 1992.   Religion-Christian   No legal issues.    Lives alone. Has 1 dog. Has pet sitting business.   2 cups of coffee a day.   Social Drivers of Corporate investment banker Strain: Low Risk  (03/16/2024)   Overall Financial Resource Strain (CARDIA)    Difficulty of Paying Living Expenses: Not  very hard  Food Insecurity: Food Insecurity Present (03/16/2024)   Hunger Vital Sign    Worried About Running Out of Food in the Last Year: Often true    Ran Out of Food in the Last Year: Sometimes true  Transportation Needs: No Transportation Needs (03/16/2024)   PRAPARE - Administrator, Civil Service (Medical): No    Lack of Transportation (Non-Medical): No  Physical Activity: Sufficiently Active (03/16/2024)   Exercise Vital Sign    Days of Exercise per Week: 5 days    Minutes of Exercise per Session: 40 min  Stress: Stress Concern Present (03/16/2024)   [employer] of Occupational Health - Occupational Stress Questionnaire    Feeling of Stress : Very much  Social Connections: Socially Isolated (03/16/2024)   Social Connection and Isolation Panel    Frequency of Communication with Friends and Family: Twice a week    Frequency of Social Gatherings with Friends and Family: Once a week    Attends Religious Services: Never    Database administrator or Organizations: No    Attends Engineer, structural: Never    Marital Status: Divorced    Her Allergies Are:  Allergies  Allergen Reactions   Nitrofurantoin  Monohyd Macro Nausea Only    Severe headache   Statins Other (See Comments)    Severe weakness, pain   Macrobid  [Nitrofurantoin ]     Make her feel crazy   Alprazolam Anxiety and Other (See Comments)    Hyper  Amoxicillin  Rash   Prednisone Anxiety   Sulfa  Antibiotics Nausea And Vomiting  :   Her Current Medications Are:  Outpatient Encounter Medications as of 04/27/2024  Medication Sig   Alirocumab  (PRALUENT ) 150 MG/ML SOAJ DIAL AND INJECT 150MG  UNDER THE SKIN EVERY 14 DAYS   azelastine  (ASTELIN ) 0.1 % nasal spray PLACE 2 SPRAYS INTO EACH NOSTRIL TWO TIMES A DAY AS DIRECTED   blood glucose meter kit and supplies KIT Dispense based on patient and insurance preference. Use up to four times daily as directed.   busPIRone  (BUSPAR ) 7.5 MG tablet Take 1 tablet  (7.5 mg total) by mouth 2 (two) times daily.   Cholecalciferol  (VITAMIN D3) 50 MCG (2000 UT) CAPS Take 2,000 Units by mouth daily.   Cholecalciferol  1.25 MG (50000 UT) TABS Take 1 tablet by mouth once a week.   Cyanocobalamin  (VITAMIN B-12 PO) Take by mouth.   fenofibrate  (TRICOR ) 145 MG tablet Take 1 tablet (145 mg total) by mouth daily.   fexofenadine  (ALLEGRA ) 180 MG tablet Take 1 tablet (180 mg total) by mouth daily.   fluticasone  (FLONASE ) 50 MCG/ACT nasal spray SPRAY TWO SPRAYS IN EACH NOSTRIL ONCE DAILY   gabapentin  (NEURONTIN ) 600 MG tablet Take 1,200 mg by mouth 2 (two) times daily.    glucose blood test strip Use as instructed DX E11.0   IBUPROFEN  PO Take by mouth as needed.   lamoTRIgine  (LAMICTAL ) 200 MG tablet Take 200 mg by mouth 2 (two) times daily.   Lancets (ONETOUCH DELICA PLUS LANCET30G) MISC Use twice daily to test blood sugars as directed Dx- uncontrolled DM II   losartan  (COZAAR ) 50 MG tablet Take 1 tablet (50 mg total) by mouth daily.   LUMIGAN 0.01 % SOLN Place 1 drop into both eyes at bedtime.    meloxicam  (MOBIC ) 15 MG tablet Take 1 tablet (15 mg total) by mouth daily as needed for pain.   methocarbamol  (ROBAXIN ) 500 MG tablet Take 1 tablet (500 mg total) by mouth every 8 (eight) hours as needed for muscle spasms.   montelukast  (SINGULAIR ) 10 MG tablet Take 1 tablet (10 mg total) by mouth at bedtime.   Multiple Vitamin (MULTIVITAMIN WITH MINERALS) TABS tablet Take 1 tablet by mouth daily.   OLANZapine  (ZYPREXA ) 5 MG tablet Take 5 mg by mouth daily as needed.   omega-3 acid ethyl esters (LOVAZA ) 1 g capsule Take 2 capsules (2 g total) by mouth 2 (two) times daily.   Pyridoxine HCl (VITAMIN B-6 PO) Take 1 tablet by mouth daily.   QUEtiapine  (SEROQUEL  XR) 400 MG 24 hr tablet Take 400 mg by mouth at bedtime.   QUEtiapine  (SEROQUEL ) 50 MG tablet Take 50 mg by mouth daily.   Semaglutide ,0.25 or 0.5MG /DOS, (OZEMPIC , 0.25 OR 0.5 MG/DOSE,) 2 MG/3ML SOPN Inject 0.25 mg into the  skin once a week.   SUMAtriptan  (IMITREX ) 50 MG tablet Take 1 tablet (50 mg total) by mouth once for 1 dose. May repeat in 2 hours if headache persists or recurs. (Patient taking differently: Take 50 mg by mouth as needed. May repeat in 2 hours if headache persists or recurs.)   timolol (TIMOPTIC) 0.5 % ophthalmic solution SMARTSIG:In Eye(s)   traMADol  (ULTRAM ) 50 MG tablet Take by mouth.   venlafaxine  (EFFEXOR -XR) 75 MG 24 hr capsule Take 75 mg by mouth. Take 2 tabs in the am and 1 tab in afternoonn   CLARAVIS 20 MG capsule Take 20 mg by mouth daily.   No facility-administered encounter medications on file as  of 04/27/2024.  :   Review of Systems:  Out of a complete 14 point review of systems, all are reviewed and negative with the exception of these symptoms as listed below:  Review of Systems  Neurological:        Pt here for cpap f/u  Pt states nose stuffy at night  Pt states fell three weeks ago an fracture vertebrae 4,5,6     ESS:4    Objective:  Neurological Exam  Physical Exam Physical Examination:   Vitals:   04/27/24 1450  BP: 107/72  Pulse: 78    General Examination: The patient is a very pleasant 68 y.o. female in no acute distress. She appears well-developed and well-nourished and well groomed.   HEENT: Normocephalic, atraumatic, pupils are equal, round and reactive to light, extraocular tracking is well-preserved, hearing grossly intact to tuning fork.  Face is symmetric with normal facial animation and normal facial sensation to light touch, temperature and vibration sense.  Speech is clear with no dysarthria noted. There is no hypophonia. There is no lip, neck/head, jaw or voice tremor. Neck is supple with full range of passive and active motion. There are no carotid bruits on auscultation. Oropharynx exam reveals: No significant mouth dryness, adequate dental hygiene and mild airway crowding. Tongue protrudes centrally and palate elevates symmetrically.     Chest: Clear to auscultation without wheezing, rhonchi or crackles noted.   Heart: S1+S2+0, regular and normal without murmurs, rubs or gallops noted.    Abdomen: Soft, non-tender and non-distended.   Extremities: There is no obvious  edema in the distal lower extremities bilaterally.    Skin: Warm and dry without trophic changes noted.     Musculoskeletal: exam reveals status post bilateral knee replacements, unremarkable scars.     Neurologically:  Mental status: The patient is awake, alert and oriented in all 4 spheres. Her immediate and remote memory, attention, language skills and fund of knowledge are appropriate. There is no evidence of aphasia, agnosia, apraxia or anomia. Speech is clear with normal prosody and enunciation. Thought process is linear. Mood is normal and affect is normal.  Cranial nerves II - XII are as described above under HEENT exam.  Motor exam: Normal bulk, global strength of at least 4+ out of 5, has some discomfort in the lower extremities, normal tone, no drift, no obvious resting or action tremor.  Reflexes 1+ throughout, trace in the ankles.   Fine motor skills and coordination grossly intact in the upper and lower extremities.  Cerebellar testing: No dysmetria or intention tremor.  No gait ataxia. Sensory exam: intact to light touch in the upper and lower extremities. Gait, station and balance: She stands somewhat slowly but does not require any assistance, does not have a walking aid.  No obvious limp.  Slight increase in lumbar kyphosis.   Assessment and Plan:    In summary, [name] is a 68 year old female with an underlying medical history of mood disorder, including bipolar disease obesity, mitral valve prolapse, hyperlipidemia, allergic rhinitis, palpitations, constipation, heart murmur, chronic cough, reflux disease, anxiety, and low back pain status post back surgery in 2001, arthritis, status post bilateral total knee arthroplasties,  tonsillectomy, and hysterectomy, status post nasal surgeries, overweight state and obstructive sleep apnea on CPAP therapy, who presents for evaluation of her leg pain.  She has a complex history.  She has arthritis, low back issues, recently had a low back injury but feels a little better after her epidural steroid injection.  She reports that she cannot tolerate oral prednisone.  She is generally compliant with her CPAP but has not had perfect compliance for consistent usage lately.  She is willing to get better with her compliance and continues to benefit from CPAP therapy.   She is advised to follow-up routinely in 1 year to see one of our nurse practitioners for sleep apnea checkup.   I answered all her questions today and she was in agreement.  Thank you very much for allowing me to participate in the care of this nice patient. If I can be of any further assistance to you please do not hesitate to call me at [phone].   Sincerely,     Melanie Fairy, MD, PhD  I spent 40 minutes in total face-to-face time and in reviewing records during pre-charting, more than 50% of which was spent in counseling and coordination of care, reviewing test results, reviewing medications and treatment regimen and/or in discussing or reviewing the diagnosis of OSA leg pain, neuropathy, chronic back pain, the prognosis and treatment options. Pertinent laboratory and imaging test results that were available during this visit with the patient were reviewed by me and considered in my medical decision making (see chart for details).

## 2024-05-04 ENCOUNTER — Telehealth: Payer: Self-pay | Admitting: Nurse Practitioner

## 2024-05-04 ENCOUNTER — Other Ambulatory Visit: Payer: Self-pay

## 2024-05-04 MED ORDER — OMEGA-3-ACID ETHYL ESTERS 1 G PO CAPS: 2.0000 | ORAL_CAPSULE | Freq: Two times a day (BID) | ORAL | 0 refills | Status: DC

## 2024-05-04 NOTE — Telephone Encounter (Signed)
 Pt's medication was already sent to pt's pharmacy for a 15 day supply, enough to get pt to appt on 05/16/24. Confirmation received.

## 2024-05-04 NOTE — Telephone Encounter (Signed)
*  STAT* If patient is at the pharmacy, call can be transferred to refill team.   1. Which medications need to be refilled? (please list name of each medication and dose if known)   omega-3 acid ethyl esters (LOVAZA ) 1 g capsule    4. Which pharmacy/location (including street and city if local pharmacy) is medication to be sent to?  [id number] - [place], [place] - [address]     5. Do they need a 30 day or 90 day supply? 30  Scheduled for 05/16/24

## 2024-05-05 ENCOUNTER — Ambulatory Visit: Admitting: Dietician

## 2024-05-15 NOTE — Progress Notes (Unsigned)
 Cardiology Office Note    Patient Name: [name] Date of Encounter: 05/15/2024  Primary Care Provider:  Mahlon Comer BRAVO, MD Primary Cardiologist:  Candyce Reek, MD Primary Electrophysiologist: None   Past Medical History    Past Medical History:  Diagnosis Date   Allergic rhinitis    Allergy    Anxiety    Arthritis    R knee, Left Knee   Asthma    Bipolar disorder (HCC)    Cataract    Complication of anesthesia    had bad anxiety after anesthesia   Constipation, chronic    Depression    Diabetes mellitus without complication (HCC)    Dysrhythmia    Palpations occ, takes Tenormin    GERD (gastroesophageal reflux disease)    Glaucoma    Heart murmur    Hx of adenomatous colonic polyps 2008   Hyperlipidemia    Hypertension    MVP (mitral valve prolapse)    Sleep apnea    wears CPAP   Urinary tract infection    frequent, see Urololgist 12/16/2010    History of Present Illness  [name] is a 68 y.o. female with a PMH of HLD, HTN, OSA (on CPAP), MVP, GERD, DM type II, bipolar disorder, family history of early CAD who presents today for 1 year follow-up.  Ms. Bari was seen initially by Dr. Reek on 03/10/2017 with complaint of palpitations.  She has a prior history of MVP and was treated with atenolol  prior palpitations.  She wore an event monitor for 30 days that showed dominantly sinus rhythm with episodic sinus tach and PVCs with no pathologic arrhythmias.  Pleated a 2D echo in 2022 that showed EF of 60 to 65% with no RWMA and trivial MVR with no mitral prolapse noted.  He was last seen by Dr. Reek on 11/2021 for complaints of chest pain.  She was trying to increase her physical activity with walking and elevated A1c.  Blood pressures were controlled and patient was continued on current medication regimen.  Ms. Minnich presents today for overdue annual follow-up.  The EKG today showed a new bundle branch block . She also reports experiencing  palpitations, especially when stressed or anxious. No chest pain, chest tightness, or shortness of breath with exercise. She feels deconditioned and out of shape. She feels tired upon waking, attributing this to her medications. She is currently taking losartan  50 mg, Neurontin , Robaxin  as needed, Mobic  as needed, and Seroquel  400 mg at night. She is concerned that Neurontin  and Seroquel  may contribute to her fatigue. She is not diabetic, with blood sugars around 5.6, and has recently started Ozempic  to lose weight, emphasizing the importance of exercise and diet. She uses a CPAP machine at night without issues. She has a history of mitral valve prolapse, last evaluated in 2022, showing controlled heart function. She is concerned about financial stress and has a history of anxiety, which exacerbates her palpitations.Her father had coronary disease and died of a massive heart attack at age 39. Patient denies chest pain, palpitations, dyspnea, PND, orthopnea, nausea, vomiting, dizziness, syncope, edema, weight gain, or early satiety.  Discussed the use of AI scribe software for clinical note transcription with the patient, who gave verbal consent to proceed.  History of Present Illness   Review of Systems  Please see the history of present illness.    All other systems reviewed and are otherwise negative except as noted above.  Physical Exam    Wt Readings from  Last 3 Encounters:  04/27/24 189 lb (85.7 kg)  04/21/24 190 lb 12.8 oz (86.5 kg)  04/04/24 195 lb (88.5 kg)   CD:Uyzmz were no vitals filed for this visit.,There is no height or weight on file to calculate BMI. GEN: Well nourished, well developed in no acute distress Neck: No JVD; No carotid bruits Pulmonary: Clear to auscultation without rales, wheezing or rhonchi  Cardiovascular: Normal rate. Regular rhythm. Normal S1. Normal S2.   Murmurs: There is no murmur.  ABDOMEN: Soft, non-tender, non-distended EXTREMITIES:  No edema; No  deformity   EKG/LABS/ Recent Cardiac Studies   ECG personally reviewed by me today -sinus rhythm with first-degree AVB and rate of 75 bpm with left axis deviation with LBBB  Risk Assessment/Calculations:          Lab Results  Component Value Date   WBC 4.1 04/21/2024   HGB 12.2 04/21/2024   HCT 35.8 (L) 04/21/2024   MCV 90.2 04/21/2024   PLT 238.0 04/21/2024   Lab Results  Component Value Date   CREATININE 0.68 04/21/2024   BUN 23 04/21/2024   NA 138 04/21/2024   K 4.3 04/21/2024   CL 104 04/21/2024   CO2 27 04/21/2024   Lab Results  Component Value Date   CHOL 163 04/21/2024   HDL 59.00 04/21/2024   LDLCALC 84 04/21/2024   LDLDIRECT 74.0 12/16/2021   TRIG 99.0 04/21/2024   CHOLHDL 3 04/21/2024    Lab Results  Component Value Date   HGBA1C 5.6 04/21/2024   Assessment & Plan    Assessment & Plan  1.  Family history of CAD/LBBB: -Newly identified left bundle branch block on EKG, indicating delayed conduction. No symptoms. Differential includes heart muscle or blood flow issues, though EKG shows no abnormal flow. - Order echocardiogram to assess heart's pumping function and mitral valve prolapse severity. - Order exercise nuclear stress test to evaluate blood flow and heart muscle function.  2.  Essential hypertension: Patient's blood pressure today is stable at 98/56 -Low blood pressure noted, possibly causing tiredness. Potentially related to medication. - Reduce losartan  to 25 mg daily. - Monitor blood pressure at home.  3.  Hyperlipidemia: - Patient's last LDL cholesterol was 84  4.  Fatigue: Tiredness potentially related to losartan , Neurontin , and Seroquel . Neurontin  and Seroquel  known to cause fatigue. - Reduce losartan  to 25 mg daily. - Discuss potential adjustment of Seroquel  with prescribing physician. - Consider reducing Neurontin  dosage to assess impact on tiredness.  5.  Palpitations: Intermittent palpitations, possibly anxiety-related. No  recent chest pain or shortness of breath. Metoprolol  discontinued due to low blood pressure. - Monitor for increased frequency or severity of palpitations. - Consider reintroducing metoprolol  if palpitations worsen.      Disposition: Follow-up with Candyce Reek, MD or APP in 6 months Informed Consent   Shared Decision Making/Informed Consent The risks [chest pain, shortness of breath, cardiac arrhythmias, dizziness, blood pressure fluctuations, myocardial infarction, stroke/transient ischemic attack, nausea, vomiting, allergic reaction, radiation exposure, metallic taste sensation and life-threatening complications (estimated to be 1 in 10,000)], benefits (risk stratification, diagnosing coronary artery disease, treatment guidance) and alternatives of a nuclear stress test were discussed in detail with Ms. Deamer and she agrees to proceed.      Signed, Wyn Raddle, Jackee Shove, NP 05/15/2024, 10:05 AM [facility]

## 2024-05-16 ENCOUNTER — Encounter: Payer: Self-pay | Admitting: Nurse Practitioner

## 2024-05-16 ENCOUNTER — Ambulatory Visit: Attending: Nurse Practitioner | Admitting: Nurse Practitioner

## 2024-05-16 VITALS — BP 88/54 | HR 75 | Ht 69.0 in | Wt 189.0 lb

## 2024-05-16 DIAGNOSIS — E119 Type 2 diabetes mellitus without complications: Secondary | ICD-10-CM | POA: Diagnosis not present

## 2024-05-16 DIAGNOSIS — E785 Hyperlipidemia, unspecified: Secondary | ICD-10-CM

## 2024-05-16 DIAGNOSIS — Z8249 Family history of ischemic heart disease and other diseases of the circulatory system: Secondary | ICD-10-CM

## 2024-05-16 DIAGNOSIS — I10 Essential (primary) hypertension: Secondary | ICD-10-CM | POA: Diagnosis not present

## 2024-05-16 DIAGNOSIS — I447 Left bundle-branch block, unspecified: Secondary | ICD-10-CM

## 2024-05-16 DIAGNOSIS — I341 Nonrheumatic mitral (valve) prolapse: Secondary | ICD-10-CM

## 2024-05-16 DIAGNOSIS — E782 Mixed hyperlipidemia: Secondary | ICD-10-CM

## 2024-05-16 MED ORDER — LOSARTAN POTASSIUM 25 MG PO TABS: 25.0000 mg | ORAL_TABLET | Freq: Every day | ORAL | 1 refills | Status: DC

## 2024-05-16 NOTE — Patient Instructions (Signed)
 Medication Instructions:  DECREASE Losartan  to 25mg  Take 1 tablet once a day  *If you need a refill on your cardiac medications before your next appointment, please call your pharmacy*  Lab Work: None ordered If you have labs (blood work) drawn today and your tests are completely normal, you will receive your results only by: MyChart Message (if you have MyChart) OR A paper copy in the mail If you have any lab test that is abnormal or we need to change your treatment, we will call you to review the results.  Testing/Procedures: Your physician has requested that you have an exercise stress myoview. For further information please visit [email or web address] Please follow instruction sheet, as given.  Your physician has requested that you have an echocardiogram. Echocardiography is a painless test that uses sound waves to create images of your heart. It provides your doctor with information about the size and shape of your heart and how well your heart's chambers and valves are working. This procedure takes approximately one hour. There are no restrictions for this procedure. Please do NOT wear cologne, perfume, aftershave, or lotions (deodorant is allowed). Please arrive 15 minutes prior to your appointment time.  Please note: We ask at that you not bring children with you during ultrasound (echo/ vascular) testing. Due to room size and safety concerns, children are not allowed in the ultrasound rooms during exams. Our front office staff cannot provide observation of children in our lobby area while testing is being conducted. An adult accompanying a patient to their appointment will only be allowed in the ultrasound room at the discretion of the ultrasound technician under special circumstances. We apologize for any inconvenience.   Follow-Up: At [facility], you and your health needs are our priority.  As part of our continuing mission to provide you with exceptional heart care, our  providers are all part of one team.  This team includes your primary Cardiologist (physician) and Advanced Practice Providers or APPs (Physician Assistants and Nurse Practitioners) who all work together to provide you with the care you need, when you need it.  Your next appointment:   6 month(s)  Provider:   Georganna Archer, MD    We recommend signing up for the patient portal called MyChart.  Sign up information is provided on this After Visit Summary.  MyChart is used to connect with patients for Virtual Visits (Telemedicine).  Patients are able to view lab/test results, encounter notes, upcoming appointments, etc.  Non-urgent messages can be sent to your provider as well.   To learn more about what you can do with MyChart, go to [email or web address].   Other Instructions

## 2024-05-24 ENCOUNTER — Other Ambulatory Visit (HOSPITAL_COMMUNITY): Payer: Self-pay

## 2024-05-24 ENCOUNTER — Telehealth: Payer: Self-pay | Admitting: Nurse Practitioner

## 2024-05-24 DIAGNOSIS — E785 Hyperlipidemia, unspecified: Secondary | ICD-10-CM

## 2024-05-24 NOTE — Telephone Encounter (Signed)
 Pt c/o medication issue:  1. Name of Medication: Alirocumab  (PRALUENT ) 150 MG/ML SOAJ   2. How are you currently taking this medication (dosage and times per day)? NA  3. Are you having a reaction (difficulty breathing--STAT)? No  4. What is your medication issue? Pt would like a c/b regarding above medication. Pt states that she has been getting medication at no cost due to Pt. Assistance. Pt states that she is getting low and medication cost is $90. Pt needs to know if grant is still effective or does she need to reapply. Please advise

## 2024-05-24 NOTE — Telephone Encounter (Signed)
 Patient grant good until 12/12/24.  Card No. [id number] Card Status Active BIN [id number] PCN [id number] [facility] [id number]

## 2024-05-26 ENCOUNTER — Other Ambulatory Visit: Payer: Self-pay | Admitting: Nurse Practitioner

## 2024-06-01 ENCOUNTER — Ambulatory Visit: Admitting: Dietician

## 2024-06-01 ENCOUNTER — Telehealth: Payer: Self-pay | Admitting: Nurse Practitioner

## 2024-06-01 DIAGNOSIS — E785 Hyperlipidemia, unspecified: Secondary | ICD-10-CM

## 2024-06-01 DIAGNOSIS — Z8249 Family history of ischemic heart disease and other diseases of the circulatory system: Secondary | ICD-10-CM

## 2024-06-01 MED ORDER — PRALUENT 150 MG/ML ~~LOC~~ SOAJ: 150.0000 mg | SUBCUTANEOUS | 1 refills | Status: DC

## 2024-06-01 NOTE — Telephone Encounter (Signed)
*  STAT* If patient is at the pharmacy, call can be transferred to refill team.   1. Which medications need to be refilled? (please list name of each medication and dose if known)  Alirocumab  (PRALUENT ) 150 MG/ML SOAJ    2. Would you like to learn more about the convenience, safety, & potential cost savings by using the [facility]?   3. Are you open to using the [facility] (Type [facility].  ).   4. Which pharmacy/location (including street and city if local pharmacy) is medication to be sent to? [id number] - [place], [place] - [address]     5. Do they need a 30 day or 90 day supply? 30 day

## 2024-06-06 ENCOUNTER — Telehealth (HOSPITAL_COMMUNITY): Payer: Self-pay | Admitting: *Deleted

## 2024-06-06 ENCOUNTER — Encounter (HOSPITAL_COMMUNITY): Payer: Self-pay | Admitting: *Deleted

## 2024-06-06 NOTE — Telephone Encounter (Signed)
 Instructions for upcoming stress test sent via [employer].  Argentina Bees, RN

## 2024-06-08 ENCOUNTER — Encounter: Payer: Self-pay | Admitting: Student in an Organized Health Care Education/Training Program

## 2024-06-08 ENCOUNTER — Ambulatory Visit: Admitting: Student in an Organized Health Care Education/Training Program

## 2024-06-08 VITALS — BP 138/83 | HR 66 | Ht 69.0 in | Wt 193.0 lb

## 2024-06-08 DIAGNOSIS — Z7985 Long-term (current) use of injectable non-insulin antidiabetic drugs: Secondary | ICD-10-CM

## 2024-06-08 DIAGNOSIS — E611 Iron deficiency: Secondary | ICD-10-CM | POA: Diagnosis not present

## 2024-06-08 DIAGNOSIS — E119 Type 2 diabetes mellitus without complications: Secondary | ICD-10-CM

## 2024-06-08 DIAGNOSIS — F319 Bipolar disorder, unspecified: Secondary | ICD-10-CM

## 2024-06-08 DIAGNOSIS — E559 Vitamin D deficiency, unspecified: Secondary | ICD-10-CM

## 2024-06-08 DIAGNOSIS — E538 Deficiency of other specified B group vitamins: Secondary | ICD-10-CM | POA: Diagnosis not present

## 2024-06-08 LAB — VITAMIN D 25 HYDROXY (VIT D DEFICIENCY, FRACTURES): VITD: 18.19 ng/mL — ABNORMAL LOW (ref 30.00–100.00)

## 2024-06-08 LAB — IBC + FERRITIN
Ferritin: 54.1 ng/mL (ref 10.0–291.0)
Iron: 114 ug/dL (ref 42–145)
Saturation Ratios: 26.7 % (ref 20.0–50.0)
TIBC: 427 ug/dL (ref 250.0–450.0)
Transferrin: 305 mg/dL (ref 212.0–360.0)

## 2024-06-08 LAB — VITAMIN B12: Vitamin B-12: 993 pg/mL — ABNORMAL HIGH (ref 211–911)

## 2024-06-08 MED ORDER — OZEMPIC (0.25 OR 0.5 MG/DOSE) 2 MG/3ML ~~LOC~~ SOPN: 0.5000 mg | PEN_INJECTOR | SUBCUTANEOUS | 1 refills | Status: DC

## 2024-06-08 NOTE — Patient Instructions (Signed)
  VISIT SUMMARY: Today, we discussed your recent fatigue and low energy levels. We reviewed your current medications and considered possible side effects. We also talked about your diabetes management, weight loss goals, and other health conditions including asthma, hypertension, and bipolar disorder.  YOUR PLAN: -FATIGUE AND WEAKNESS: Your fatigue and weakness may be due to side effects from your medication, particularly Seroquel . We will check your iron and B12 levels with a blood test and discuss with your psychiatry provider about possibly reducing your Seroquel  dosage.  -TYPE 2 DIABETES MELLITUS: Your diabetes is well-controlled with an A1c of 5.6%. To help with weight loss, we will increase your Ozempic  dose to 0.5 mg and continue to monitor your blood sugar levels.  -OBESITY: We have not seen weight loss with your current Ozempic  dose. We will increase the dose to 0.5 mg to help with weight loss.  -BIPOLAR DISORDER: The high dose of Seroquel  you are taking may be contributing to your fatigue. We will discuss with your psychiatry provider about reducing the dosage to help manage your fatigue while still controlling your bipolar disorder.  -ASTHMA: Your asthma is currently managed with Singulair , and your lung exam is normal. Continue taking Singulair  as prescribed.  -HYPERTENSION: Your blood pressure is managed with losartan  25 mg, which was reduced from 50 mg due to low readings. Continue taking losartan  25 mg as prescribed.  INSTRUCTIONS: Please follow up with the blood tests for iron and B12 levels. We will also need to discuss with your psychiatry provider about possibly reducing your Seroquel  dosage. Continue monitoring your blood sugar levels and take the increased dose of Ozempic  as directed. If you experience any new symptoms or have concerns, please contact our office.

## 2024-06-08 NOTE — Assessment & Plan Note (Signed)
 Symptoms of increasing fatigue and restless leg symptoms may be due to a new iron deficiency.  Will check iron levels.  CBC in June was normal, no signs of anemia on exam.

## 2024-06-08 NOTE — Assessment & Plan Note (Signed)
 A1c at 5.6% indicates good control. Fatigue is unlikely due to Ozempic . She is interested in a dose increase for weight loss. Increased Ozempic  to 0.5 mg and continue monitoring blood sugar levels.

## 2024-06-08 NOTE — Assessment & Plan Note (Signed)
 Bipolar disease seems well-controlled.  Patient does have significant polypharmacy at age 68.  She is on high dose of daily Seroquel  and uses clonazepam  as needed in addition to Lamictal  and gabapentin .  Currently she is having symptoms of daily fatigue, and I suspect this polypharmacy with centrally acting medications are at least partly contributing.  I recommended that she follow-up with her psychiatry team to consider dose reduction which may be appropriate now that she is getting older.

## 2024-06-08 NOTE — Assessment & Plan Note (Signed)
 History of B12 deficiency.  Not currently on supplementation.  Having increasing fatigue.  Will check B12 levels.

## 2024-06-08 NOTE — Progress Notes (Signed)
 Acute Office Visit  Subjective:     Patient ID: [name], female    DOB: 1955-11-24, 68 y.o.   MRN: [id number]  Chief Complaint  Patient presents with   Blood Sugar Problem    Pt recently having low and high readings. Tired and weak. She is currently on Ozempic .     HPI  Discussed the use of AI scribe software for clinical note transcription with the patient, who gave verbal consent to proceed.  History of Present Illness [name] is a 68 year old female with diabetes who presents with fatigue and low energy.  She has been experiencing fatigue and low energy levels for about a week, which she attributes to exhaustion from pet sitting. She feels particularly worn out when climbing stairs and has not been able to regain her usual energy levels. No chest pain, lightheadedness, or breathing problems. She reports weakness in her legs, especially when walking her dog.  She has been on Ozempic  for almost two months at a dose of 0.25 mg for diabetes management. Her blood sugar levels have been fluctuating, with a low reading of 95 mg/dL, which she considers low for her. She is not on insulin and her A1c is 5.6%.  Her medication regimen includes losartan  25 mg, which was reduced from 50 mg due to low blood pressure readings. She also takes Seroquel  50 mg as needed during the day and 400 mg at night, and clonazepam  2 mg as needed, which she breaks in half. She has stopped taking Zyprexa  and Buspar . She is managed by a PA at a psychiatry center.  She uses a CPAP machine for sleep apnea and reports no issues with its use. She denies any recent changes in her psychiatric medications and does not consume alcohol.  She has a history of asthma and is on Singulair  (montelukast ) but does not currently use an inhaler. She mentions occasional wheezing but denies any current breathing problems.  She has a family history of heart disease, with her father having died of a heart attack at age 3.  She has had an ultrasound of her heart in the past due to a mitral valve issue and experiences heart palpitations when stressed.      Objective:    BP 138/83 (BP Location: Right Arm, Patient Position: Sitting, Cuff Size: Normal)   Pulse 66   Ht 5' 9 (1.753 m)   Wt 193 lb (87.5 kg)   LMP 11/09/2000   SpO2 97%   BMI 28.50 kg/m    Physical Exam  Gen: Well-appearing woman Neck: Normal thyroid , no nodules or adenopathy Heart: Regular, no murmur Lungs: Unlabored, clear throughout, no wheezing Ext: Warm, no edema Neruo: Alert, conversational, full strength upper and lower extremities, normal patellar reflexes, normal get up and go, normal gait and balance Psych: Appropriate mood and affect, not depressed or anxious appearing      Assessment & Plan:    Problem List Items Addressed This Visit       Unprioritized   Bipolar 1 disorder (HCC)   Bipolar disease seems well-controlled.  Patient does have significant polypharmacy at age 75.  She is on high dose of daily Seroquel  and uses clonazepam  as needed in addition to Lamictal  and gabapentin .  Currently she is having symptoms of daily fatigue, and I suspect this polypharmacy with centrally acting medications are at least partly contributing.  I recommended that she follow-up with her psychiatry team to consider dose reduction which may be  appropriate now that she is getting older.      Diabetes mellitus without complication (HCC) - Primary   A1c at 5.6% indicates good control. Fatigue is unlikely due to Ozempic . She is interested in a dose increase for weight loss. Increased Ozempic  to 0.5 mg and continue monitoring blood sugar levels.      Relevant Medications   Semaglutide ,0.25 or 0.5MG /DOS, (OZEMPIC , 0.25 OR 0.5 MG/DOSE,) 2 MG/3ML SOPN   Iron deficiency   Symptoms of increasing fatigue and restless leg symptoms may be due to a new iron deficiency.  Will check iron levels.  CBC in June was normal, no signs of anemia on exam.       Relevant Orders   IBC + Ferritin   B12 deficiency   History of B12 deficiency.  Not currently on supplementation.  Having increasing fatigue.  Will check B12 levels.      Relevant Orders   Vitamin B12   Vitamin D  deficiency   Relevant Orders   VITAMIN D  25 Hydroxy (Vit-D Deficiency, Fractures)    Meds ordered this encounter  Medications   Semaglutide ,0.25 or 0.5MG /DOS, (OZEMPIC , 0.25 OR 0.5 MG/DOSE,) 2 MG/3ML SOPN    Sig: Inject 0.5 mg into the skin once a week.    Dispense:  3 mL    Refill:  1    Dose change    Return in about 2 months (around 08/08/2024).  Cleatus Debby Specking, MD

## 2024-06-09 ENCOUNTER — Ambulatory Visit: Payer: Self-pay | Admitting: Student in an Organized Health Care Education/Training Program

## 2024-06-09 ENCOUNTER — Other Ambulatory Visit: Payer: Self-pay | Admitting: Nurse Practitioner

## 2024-06-09 DIAGNOSIS — E119 Type 2 diabetes mellitus without complications: Secondary | ICD-10-CM

## 2024-06-09 DIAGNOSIS — I341 Nonrheumatic mitral (valve) prolapse: Secondary | ICD-10-CM

## 2024-06-09 DIAGNOSIS — I10 Essential (primary) hypertension: Secondary | ICD-10-CM

## 2024-06-09 DIAGNOSIS — Z8249 Family history of ischemic heart disease and other diseases of the circulatory system: Secondary | ICD-10-CM

## 2024-06-09 DIAGNOSIS — E785 Hyperlipidemia, unspecified: Secondary | ICD-10-CM

## 2024-06-12 ENCOUNTER — Ambulatory Visit (HOSPITAL_COMMUNITY)
Admission: RE | Admit: 2024-06-12 | Source: Ambulatory Visit | Attending: Nurse Practitioner | Admitting: Nurse Practitioner

## 2024-06-14 ENCOUNTER — Encounter (HOSPITAL_COMMUNITY): Payer: Self-pay | Admitting: *Deleted

## 2024-06-16 ENCOUNTER — Encounter: Payer: Self-pay | Admitting: Family Medicine

## 2024-06-16 ENCOUNTER — Ambulatory Visit (INDEPENDENT_AMBULATORY_CARE_PROVIDER_SITE_OTHER): Admitting: Family Medicine

## 2024-06-16 VITALS — BP 124/82 | HR 74 | Temp 98.2°F | Ht 69.0 in | Wt 193.4 lb

## 2024-06-16 DIAGNOSIS — R5383 Other fatigue: Secondary | ICD-10-CM | POA: Diagnosis not present

## 2024-06-16 NOTE — Progress Notes (Signed)
   Subjective:    Patient ID: [name], female    DOB: 07-Jan-1956, 68 y.o.   MRN: [id number]  HPI Fatigue- pt reports increased fatigue recently.  Has been spending a lot of time pet sitting.  Pt admits to eating poorly and thinks this may be contributing.  Has not been following her regular exercise routine.   Review of Systems For ROS see HPI     Objective:   Physical Exam Vitals reviewed.  Constitutional:      General: She is not in acute distress.    Appearance: Normal appearance. She is not ill-appearing.  HENT:     Head: Normocephalic and atraumatic.  Eyes:     Extraocular Movements: Extraocular movements intact.     Conjunctiva/sclera: Conjunctivae normal.  Cardiovascular:     Rate and Rhythm: Normal rate and regular rhythm.  Pulmonary:     Effort: Pulmonary effort is normal. No respiratory distress.  Abdominal:     General: There is no distension.     Palpations: Abdomen is soft.     Tenderness: There is no abdominal tenderness. There is no guarding or rebound.  Skin:    General: Skin is warm and dry.     Coloration: Skin is not jaundiced.  Neurological:     General: No focal deficit present.     Mental Status: She is alert and oriented to person, place, and time.  Psychiatric:        Mood and Affect: Mood normal.        Behavior: Behavior normal.        Thought Content: Thought content normal.           Assessment & Plan:  Fatigue- recurrent.  Pt has hx of similar.  Her body is very sensitive to changes in her routine and I suspect that her increased pet setting activity, change in diet, and lack of regular exercise are all impacting her.  Vitals and PE WNL.  Reviewed recent labs- no need to repeat.  Encouraged her to get back on track w/ diet and exercise and will f/u as scheduled.  Pt expressed understanding and is in agreement w/ plan.

## 2024-06-16 NOTE — Patient Instructions (Signed)
 Follow up as needed or as scheduled Try and get your diet back on track- that always makes you feel better! Increase the Semaglutide  to 0.5mg  weekly Continue to exercise- you look great! Call with any questions or concerns Stay Safe!  Stay Healthy! Enjoy the rest of your summer!!!

## 2024-06-20 ENCOUNTER — Telehealth (HOSPITAL_COMMUNITY): Payer: Self-pay

## 2024-06-20 NOTE — Telephone Encounter (Signed)
 Te patient called with some questions about her test. She asked me to call back and leave her instructions on her voicemail. S.Areon Cocuzza CCT

## 2024-06-23 ENCOUNTER — Ambulatory Visit (HOSPITAL_BASED_OUTPATIENT_CLINIC_OR_DEPARTMENT_OTHER)
Admission: RE | Admit: 2024-06-23 | Discharge: 2024-06-23 | Disposition: A | Discharge status: Home / Self Care | Source: Ambulatory Visit | Attending: Nurse Practitioner | Admitting: Nurse Practitioner

## 2024-06-23 ENCOUNTER — Ambulatory Visit (HOSPITAL_COMMUNITY)
Admission: RE | Admit: 2024-06-23 | Discharge: 2024-06-23 | Disposition: A | Discharge status: Home / Self Care | Source: Ambulatory Visit | Attending: Nurse Practitioner | Admitting: Nurse Practitioner

## 2024-06-23 ENCOUNTER — Ambulatory Visit: Payer: Self-pay | Admitting: General Practice

## 2024-06-23 DIAGNOSIS — Z8249 Family history of ischemic heart disease and other diseases of the circulatory system: Secondary | ICD-10-CM | POA: Diagnosis present

## 2024-06-23 DIAGNOSIS — I447 Left bundle-branch block, unspecified: Secondary | ICD-10-CM

## 2024-06-23 DIAGNOSIS — E119 Type 2 diabetes mellitus without complications: Secondary | ICD-10-CM | POA: Diagnosis present

## 2024-06-23 DIAGNOSIS — E785 Hyperlipidemia, unspecified: Secondary | ICD-10-CM

## 2024-06-23 DIAGNOSIS — I341 Nonrheumatic mitral (valve) prolapse: Secondary | ICD-10-CM | POA: Insufficient documentation

## 2024-06-23 DIAGNOSIS — I10 Essential (primary) hypertension: Secondary | ICD-10-CM | POA: Diagnosis not present

## 2024-06-23 LAB — MYOCARDIAL PERFUSION IMAGING
LV dias vol: 124 mL (ref 46–106)
LV sys vol: 39 mL (ref 3.8–5.2)
Nuc Stress EF: 69 %
Peak HR: 83 {beats}/min
Rest HR: 68 {beats}/min
Rest Nuclear Isotope Dose: 10.4 mCi
Stress Nuclear Isotope Dose: 30.9 mCi
TID: 0.97

## 2024-06-23 LAB — ECHOCARDIOGRAM COMPLETE
Area-P 1/2: 3.65 cm2
Height: 69 in
S' Lateral: 2.81 cm
Weight: 3024 [oz_av]

## 2024-06-23 MED ORDER — TECHNETIUM TC 99M TETROFOSMIN IV KIT
10.4000 | PACK | Freq: Once | INTRAVENOUS | Status: AC | PRN
  Administered 2024-06-23: 10.4 via INTRAVENOUS

## 2024-06-23 MED ORDER — TECHNETIUM TC 99M TETROFOSMIN IV KIT
30.9000 | PACK | Freq: Once | INTRAVENOUS | Status: AC | PRN
  Administered 2024-06-23: 30.9 via INTRAVENOUS

## 2024-06-23 MED ORDER — REGADENOSON 0.4 MG/5ML IV SOLN
INTRAVENOUS | Status: AC
Start: 2024-06-23 — End: 2024-06-23
  Filled 2024-06-23: qty 5

## 2024-06-23 MED ORDER — REGADENOSON 0.4 MG/5ML IV SOLN
0.4000 mg | Freq: Once | INTRAVENOUS | Status: AC
  Administered 2024-06-23: 0.4 mg via INTRAVENOUS

## 2024-06-26 ENCOUNTER — Ambulatory Visit: Payer: Self-pay | Admitting: Nurse Practitioner

## 2024-06-26 DIAGNOSIS — R911 Solitary pulmonary nodule: Secondary | ICD-10-CM

## 2024-06-30 ENCOUNTER — Other Ambulatory Visit: Payer: Self-pay | Admitting: Nurse Practitioner

## 2024-06-30 NOTE — Telephone Encounter (Signed)
 Patient returned staff call regarding results.

## 2024-07-03 ENCOUNTER — Other Ambulatory Visit: Payer: Self-pay | Admitting: Nurse Practitioner

## 2024-07-06 ENCOUNTER — Other Ambulatory Visit (HOSPITAL_COMMUNITY)

## 2024-07-06 ENCOUNTER — Ambulatory Visit (HOSPITAL_COMMUNITY)
Admission: RE | Admit: 2024-07-06 | Discharge: 2024-07-06 | Disposition: A | Discharge status: Home / Self Care | Source: Ambulatory Visit | Attending: Nurse Practitioner | Admitting: Nurse Practitioner

## 2024-07-06 DIAGNOSIS — R911 Solitary pulmonary nodule: Secondary | ICD-10-CM | POA: Insufficient documentation

## 2024-07-06 MED ORDER — IOHEXOL 350 MG/ML SOLN
75.0000 mL | Freq: Once | INTRAVENOUS | Status: AC | PRN
  Administered 2024-07-06: 75 mL via INTRAVENOUS

## 2024-07-07 ENCOUNTER — Ambulatory Visit: Admitting: Family Medicine

## 2024-07-07 ENCOUNTER — Encounter: Payer: Self-pay | Admitting: Family Medicine

## 2024-07-07 ENCOUNTER — Telehealth: Payer: Self-pay

## 2024-07-07 VITALS — Temp 98.0°F | Wt 195.0 lb

## 2024-07-07 DIAGNOSIS — R42 Dizziness and giddiness: Secondary | ICD-10-CM | POA: Diagnosis not present

## 2024-07-07 DIAGNOSIS — J301 Allergic rhinitis due to pollen: Secondary | ICD-10-CM

## 2024-07-07 MED ORDER — MECLIZINE HCL 25 MG PO TABS: 25.0000 mg | ORAL_TABLET | Freq: Three times a day (TID) | ORAL | 0 refills | Status: DC | PRN

## 2024-07-07 MED ORDER — FLUTICASONE PROPIONATE 50 MCG/ACT NA SUSP: NASAL | 3 refills | Status: AC

## 2024-07-07 NOTE — Patient Instructions (Signed)
 Follow up as needed or as scheduled START the Flonase - 2 sprays each nostril daily- to help w/ congestion and dizziness TAKE the Meclizine  as needed when feeling dizzy Chang positions SLOWLY to allow yourself time to adjust Drink LOTS of fluids ADD Miralax once daily to keep bowels moving Call with any questions or concerns Stay Safe!  Stay Healthy! Happy Labor Day!!!

## 2024-07-07 NOTE — Telephone Encounter (Signed)
 Copied from CRM [id number]. Topic: Clinical - Prescription Issue >> Jul 07, 2024  4:31 PM Sasha M wrote: Reason for CRM: pt called in because pharmacy is having issue with Flonase  prescription and states that they sent something to provider office but have not heard back yet. Pt states she needs medication before weekend please.

## 2024-07-07 NOTE — Progress Notes (Signed)
   Subjective:    Patient ID: [name], female    DOB: 05/01/1956, 68 y.o.   MRN: [id number]  HPI Dizziness- pt reports she is having the sensation of things moving horizontally when she moves her head.  Sxs started ~2 months ago.  R ear fullness and popping.  She is concerned about falling.  Has not tried anything OTC.  Reports she is drinking plenty of water.   Review of Systems For ROS see HPI     Objective:   Physical Exam Vitals reviewed.  Constitutional:      General: She is not in acute distress.    Appearance: Normal appearance. She is not ill-appearing.  HENT:     Head: Normocephalic and atraumatic.     Right Ear: Tympanic membrane is retracted.     Left Ear: Tympanic membrane and ear canal normal.     Nose: Congestion present. No rhinorrhea.     Mouth/Throat:     Mouth: Mucous membranes are moist.     Pharynx: No oropharyngeal exudate or posterior oropharyngeal erythema.  Eyes:     Extraocular Movements: Extraocular movements intact.     Conjunctiva/sclera: Conjunctivae normal.  Cardiovascular:     Rate and Rhythm: Normal rate and regular rhythm.  Pulmonary:     Effort: Pulmonary effort is normal. No respiratory distress.     Breath sounds: No wheezing.  Musculoskeletal:     Cervical back: Neck supple.  Lymphadenopathy:     Cervical: No cervical adenopathy.  Skin:    General: Skin is warm and dry.  Neurological:     General: No focal deficit present.     Mental Status: She is alert and oriented to person, place, and time.  Psychiatric:        Mood and Affect: Mood normal.        Behavior: Behavior normal.        Thought Content: Thought content normal.           Assessment & Plan:  Dizziness- deteriorated.  Pt has hx of similar.  Currently only having sxs w/ head movement.  R TM retracted w/ nasal congestion.  Likely having a degree of vertigo triggered by under treated seasonal allergies.  Add Flonase  to improve congestion and ear fullness.   Meclizine  prn.  Reviewed supportive care and red flags that should prompt return.  Pt expressed understanding and is in agreement w/ plan.

## 2024-07-10 ENCOUNTER — Ambulatory Visit: Payer: Self-pay | Admitting: Nurse Practitioner

## 2024-07-11 NOTE — Telephone Encounter (Signed)
 Pt has receive this rx.

## 2024-07-12 ENCOUNTER — Telehealth: Payer: Self-pay | Admitting: Nurse Practitioner

## 2024-07-12 NOTE — Telephone Encounter (Signed)
 Called pt. She wanted ann appointment to discuss results. Appt made with Jackee.

## 2024-07-12 NOTE — Telephone Encounter (Signed)
  Per MyChart scheduling message:  All of my tests results. I need to understand more. My anxiety is up. Please call me

## 2024-07-24 NOTE — Progress Notes (Deleted)
 Cardiology Office Note    Patient Name: [name] Date of Encounter: 07/24/2024  Primary Care Provider:  Mahlon Comer BRAVO, MD Primary Cardiologist:  Candyce Reek, MD Primary Electrophysiologist: None   Past Medical History    Past Medical History:  Diagnosis Date   Allergic rhinitis    Allergy    Anxiety    Arthritis    R knee, Left Knee   Asthma    Bipolar disorder (HCC)    Cataract    Complication of anesthesia    had bad anxiety after anesthesia   Constipation, chronic    Depression    Diabetes mellitus without complication (HCC)    Dysrhythmia    Palpations occ, takes Tenormin    GERD (gastroesophageal reflux disease)    Glaucoma    Heart murmur    Hx of adenomatous colonic polyps 2008   Hyperlipidemia    Hypertension    MVP (mitral valve prolapse)    Sleep apnea    wears CPAP   Urinary tract infection    frequent, see Urololgist 12/16/2010    History of Present Illness  [name] is a 68 y.o. female with a PMH of HLD, HTN, OSA (on CPAP), MVP, GERD, DM type II, bipolar disorder, family history of early CAD who presents today for 57-month follow-up.  [name] was last seen on 05/16/2024 and was found to have newly identified bundle branch block.  She had an updated 2D echo completed as well as exercise nuclear stress test to evaluate for possible ischemia.  She was found to have normal stress test with mild coronary calcium  and no evidence of wall motion abnormalities.  She was also found to have pulmonary nodule and underwent CT of the lungs for further evaluation that showed no evidence of suspicion of mass and 13 mm nodule in the right lobe of the thyroid  which showed no evidence of pathology.  The recommendation was made by radiologist to have no follow-up imaging.    Patient denies chest pain, palpitations, dyspnea, PND, orthopnea, nausea, vomiting, dizziness, syncope, edema, weight gain, or early satiety.   Discussed the use of AI scribe  software for clinical note transcription with the patient, who gave verbal consent to proceed.  History of Present Illness    ***Notes:   Review of Systems  Please see the history of present illness.    All other systems reviewed and are otherwise negative except as noted above.  Physical Exam    Wt Readings from Last 3 Encounters:  07/07/24 195 lb (88.5 kg)  06/23/24 189 lb (85.7 kg)  06/16/24 193 lb 6 oz (87.7 kg)   CD:Uyzmz were no vitals filed for this visit.,There is no height or weight on file to calculate BMI. GEN: Well nourished, well developed in no acute distress Neck: No JVD; No carotid bruits Pulmonary: Clear to auscultation without rales, wheezing or rhonchi  Cardiovascular: Normal rate. Regular rhythm. Normal S1. Normal S2.   Murmurs: There is no murmur.  ABDOMEN: Soft, non-tender, non-distended EXTREMITIES:  No edema; No deformity   EKG/LABS/ Recent Cardiac Studies   ECG personally reviewed by me today - ***  Risk Assessment/Calculations:   {Does this patient have ATRIAL FIBRILLATION?:[phone]}      Lab Results  Component Value Date   WBC 4.1 04/21/2024   HGB 12.2 04/21/2024   HCT 35.8 (L) 04/21/2024   MCV 90.2 04/21/2024   PLT 238.0 04/21/2024   Lab Results  Component Value Date   CREATININE  0.68 04/21/2024   BUN 23 04/21/2024   NA 138 04/21/2024   K 4.3 04/21/2024   CL 104 04/21/2024   CO2 27 04/21/2024   Lab Results  Component Value Date   CHOL 163 04/21/2024   HDL 59.00 04/21/2024   LDLCALC 84 04/21/2024   LDLDIRECT 74.0 12/16/2021   TRIG 99.0 04/21/2024   CHOLHDL 3 04/21/2024    Lab Results  Component Value Date   HGBA1C 5.6 04/21/2024   Assessment & Plan    Assessment and Plan Assessment & Plan     1.  Family history of CAD/LBBB  2.  Essential hypertension  3.  Hyperlipidemia   4.  Pulmonary nodule      Disposition: Follow-up with Candyce Reek, MD or APP in *** months {Are you ordering a CV Procedure  (e.g. stress test, cath, DCCV, TEE, etc)?   Press F2        :[id number]}   Signed, Wyn Raddle, Jackee Shove, NP 07/24/2024, 8:37 PM [facility]

## 2024-07-25 ENCOUNTER — Ambulatory Visit: Admitting: Nurse Practitioner

## 2024-07-25 ENCOUNTER — Encounter: Payer: Self-pay | Admitting: Internal Medicine

## 2024-07-25 DIAGNOSIS — E785 Hyperlipidemia, unspecified: Secondary | ICD-10-CM

## 2024-07-25 DIAGNOSIS — I10 Essential (primary) hypertension: Secondary | ICD-10-CM

## 2024-07-25 DIAGNOSIS — I2510 Atherosclerotic heart disease of native coronary artery without angina pectoris: Secondary | ICD-10-CM

## 2024-07-25 DIAGNOSIS — R911 Solitary pulmonary nodule: Secondary | ICD-10-CM

## 2024-07-25 NOTE — Progress Notes (Signed)
 Cardiology Office Note:   Date:  07/25/2024  ID:  [name], DOB [birth year], MRN [id number] PCP: Mahlon Comer BRAVO, MD  [facility] Providers Cardiologist:  Georganna Archer, MD { Chief Complaint:  Chief Complaint  Patient presents with   Follow-up      History of Present Illness:   DENEISHA Hill is a 68 y.o. female with a PMH of HTN, HLD, OSA (on CPAP), MVP and DM2 who presents for follow up.  Last visit with cardiology on 05/16/2024 with the APP.  ECG demonstrated a new LBBB and she underwent a complete echocardiogram and NM stress.  Her echo was unremarkable.  Her NM stress showed a fixed anterior defect at both rest and stress favored to be artifact.  She had a CT scan of her chest in August which showed trace bilateral pleural effusions.  She is requesting further explanations for these results.  The patient is doing well for the most part.  She reports having a herniated disc in her back that is causing her ongoing pain and is negatively impacting her ability to exercise.  She underwent steroid back injections recently.  She denies symptoms from a cardiopulmonary standpoint.  She denies chest pain, DOE, SOB, palpitations, syncope, presyncope, PND, orthopnea, and swelling.  She reports missing several doses of her Repatha  recently.  She reports that she is taking the rest of her medications as prescribed.  Denies tobacco, alcohol, illicit drug use.  No further complaints.    Past Medical History:  Diagnosis Date   Allergic rhinitis    Allergy    Anxiety    Arthritis    R knee, Left Knee   Asthma    Bipolar disorder (HCC)    Cataract    Complication of anesthesia    had bad anxiety after anesthesia   Constipation, chronic    Depression    Diabetes mellitus without complication (HCC)    Dysrhythmia    Palpations occ, takes Tenormin    GERD (gastroesophageal reflux disease)    Glaucoma    Heart murmur    Hx of adenomatous colonic polyps 2008   Hyperlipidemia     Hypertension    MVP (mitral valve prolapse)    Sleep apnea    wears CPAP   Urinary tract infection    frequent, see Urololgist 12/16/2010     Studies Reviewed:    EKG: N/A       Cardiac Studies & Procedures   ______________________________________________________________________________________________   STRESS TESTS  MYOCARDIAL PERFUSION IMAGING 06/23/2024  Interpretation Summary   Fixed anterior perfusion defect with normal wall motion, consistent with artifact.  Low risk study   The study is normal. The study is low risk.   LV perfusion is abnormal. Defect 1: There is a medium defect with mild reduction in uptake present in the apical to basal anterior location(s) that is fixed. There is normal wall motion in the defect area. Consistent with artifact caused by breast attenuation.   Left ventricular function is normal. End diastolic cavity size is normal. End systolic cavity size is normal.   CT images were obtained for attenuation correction and were examined for the presence of coronary calcium  when appropriate.   Coronary calcium  was present on the attenuation correction CT images. Mild coronary calcifications were present. Coronary calcifications were present in the left anterior descending artery distribution(s).   Prior study not available for comparison.   ECHOCARDIOGRAM  ECHOCARDIOGRAM COMPLETE 06/23/2024  Narrative ECHOCARDIOGRAM REPORT    Patient  Name:   [name] Date of Exam: 06/23/2024 Medical Rec #:  [id number]     Height:       69.0 in Accession #:    [id number]    Weight:       193.4 lb Date of Birth:  Nov 14, 1955     BSA:          2.037 m Patient Age:    67 years      BP:           124/82 mmHg Patient Gender: F             HR:           70 bpm. Exam Location:  [name]  Procedure: 2D Echo, 3D Echo and Strain Analysis (Both Spectral and Color Flow Doppler were utilized during procedure).  Indications:    I34.1 Nonrheumatic mitral (valve)  prolapse; I44.7 LBBB  History:        Patient has prior history of Echocardiogram examinations, most recent 03/12/2021. Signs/Symptoms:Fatigue; Risk Factors:Hypertension, Diabetes, Dyslipidemia, Family History of Coronary Artery Disease and Sleep Apnea. Obesity.  Sonographer:    [name] RCS Referring Phys: [phone] [name] DICK  IMPRESSIONS   1. Left ventricular ejection fraction, by estimation, is 55 to 60%. The left ventricle has normal function. The left ventricle has no regional wall motion abnormalities. There is moderate left ventricular hypertrophy. Left ventricular diastolic parameters are consistent with Grade I diastolic dysfunction (impaired relaxation). The average left ventricular global longitudinal strain is -20.2 %. The global longitudinal strain is normal. 2. Right ventricular systolic function is normal. The right ventricular size is normal. 3. The mitral valve is normal in structure. Mild mitral valve regurgitation. No evidence of mitral stenosis. 4. The aortic valve is tricuspid. Aortic valve regurgitation is mild. No aortic stenosis is present. 5. The inferior vena cava is normal in size with greater than 50% respiratory variability, suggesting right atrial pressure of 3 mmHg.  FINDINGS Left Ventricle: Left ventricular ejection fraction, by estimation, is 55 to 60%. The left ventricle has normal function. The left ventricle has no regional wall motion abnormalities. The average left ventricular global longitudinal strain is -20.2 %. Strain was performed and the global longitudinal strain is normal. The left ventricular internal cavity size was normal in size. There is moderate left ventricular hypertrophy. Left ventricular diastolic parameters are consistent with Grade I diastolic dysfunction (impaired relaxation).  Right Ventricle: The right ventricular size is normal. Right ventricular systolic function is normal.  Left Atrium: Left atrial size was normal in  size.  Right Atrium: Right atrial size was normal in size.  Pericardium: There is no evidence of pericardial effusion.  Mitral Valve: The mitral valve is normal in structure. Mild mitral valve regurgitation. No evidence of mitral valve stenosis.  Tricuspid Valve: The tricuspid valve is normal in structure. Tricuspid valve regurgitation is trivial. No evidence of tricuspid stenosis.  Aortic Valve: The aortic valve is tricuspid. Aortic valve regurgitation is mild. No aortic stenosis is present.  Pulmonic Valve: The pulmonic valve was normal in structure. Pulmonic valve regurgitation is trivial. No evidence of pulmonic stenosis.  Aorta: The aortic root is normal in size and structure.  Venous: The inferior vena cava is normal in size with greater than 50% respiratory variability, suggesting right atrial pressure of 3 mmHg.  IAS/Shunts: No atrial level shunt detected by color flow Doppler.  Additional Comments: 3D was performed not requiring image post processing on an independent workstation  and was normal.   LEFT VENTRICLE PLAX 2D LVIDd:         4.20 cm   Diastology LVIDs:         2.81 cm   LV e' medial:    7.18 cm/s LV PW:         1.31 cm   LV E/e' medial:  11.4 LV IVS:        1.01 cm   LV e' lateral:   8.05 cm/s LVOT diam:     2.00 cm   LV E/e' lateral: 10.2 LV SV:         81 LV SV Index:   40        2D Longitudinal Strain LVOT Area:     3.14 cm  2D Strain GLS (A4C):   -18.6 % 2D Strain GLS (A3C):   -20.0 % 2D Strain GLS (A2C):   -21.9 % 2D Strain GLS Avg:     -20.2 %  3D Volume EF: 3D EF:        55 % LV EDV:       134 ml LV ESV:       60 ml LV SV:        74 ml  RIGHT VENTRICLE RV Basal diam:  3.74 cm RV S prime:     13.30 cm/s TAPSE (M-mode): 2.3 cm  LEFT ATRIUM             Index        RIGHT ATRIUM           Index LA diam:        3.90 cm 1.91 cm/m   RA Area:     12.60 cm LA Vol (A2C):   71.4 ml 35.06 ml/m  RA Volume:   28.10 ml  13.80 ml/m LA Vol (A4C):    48.6 ml 23.86 ml/m LA Biplane Vol: 60.4 ml 29.66 ml/m AORTIC VALVE LVOT Vmax:   114.00 cm/s LVOT Vmean:  74.400 cm/s LVOT VTI:    0.259 m  AORTA Ao Root diam: 2.80 cm Ao Asc diam:  3.30 cm  MITRAL VALVE MV Area (PHT): 3.65 cm    SHUNTS MV Decel Time: 208 msec    Systemic VTI:  0.26 m MV E velocity: 81.80 cm/s  Systemic Diam: 2.00 cm MV A velocity: 90.70 cm/s MV E/A ratio:  0.90  Redell Shallow MD Electronically signed by Redell Shallow MD Signature Date/Time: 06/23/2024/12:50:30 PM    Final    MONITORS  CARDIAC EVENT MONITOR 04/30/2017  Narrative  Predominantly normal sinus rhythm.  Episodic sinus tachycardia and PVCs that occasionally correlated to patient's palpitations.  No pathologic arrhythmias.       ______________________________________________________________________________________________      Risk Assessment/Calculations:              Physical Exam:     VS:  BP 128/75   Pulse 67   Ht 5' 9 (1.753 m)   Wt 189 lb (85.7 kg)   LMP 11/09/2000   SpO2 95%   BMI 27.91 kg/m      Wt Readings from Last 3 Encounters:  07/07/24 195 lb (88.5 kg)  06/23/24 189 lb (85.7 kg)  06/16/24 193 lb 6 oz (87.7 kg)     GEN: Well nourished, well developed, in no acute distress NECK: No JVD; No carotid bruits CARDIAC: RRR, no murmurs, rubs, gallops RESPIRATORY:  Clear to auscultation without rales, wheezing or rhonchi  ABDOMEN: Soft, non-tender, non-distended, normal bowel sounds  EXTREMITIES:  Warm and well perfused, no edema; No deformity, 2+ radial pulses PSYCH: Normal mood and affect   Assessment & Plan Mixed hyperlipidemia Her most recent LDL was 84 which is higher than her usual LDL.  She endorsed missing several doses of her Repatha  lately simply due to forgetting.  Ideally her LDL will be as low as possible but is unclear to me exactly what her LDL goal should be.  She plans to start consistently taking her Repatha  again.  I will check a lipid  panel and lipoprotein a in 3 months and can better determine what her LDL goal should be pending reassessment. -Lipid panel, lipoprotein a in 3 months -Follow-up in 12 months  Primary hypertension Blood pressure is at goal.  Keep up the good work.      This note was written with the assistance of a dictation microphone or AI dictation software. Please excuse any typos or grammatical errors.   Signed, Georganna Archer, MD 07/25/2024 10:12 AM    [facility]

## 2024-07-26 ENCOUNTER — Ambulatory Visit: Attending: Cardiology | Admitting: Student in an Organized Health Care Education/Training Program

## 2024-07-26 ENCOUNTER — Other Ambulatory Visit: Payer: Self-pay

## 2024-07-26 VITALS — BP 128/75 | HR 67 | Ht 69.0 in | Wt 189.0 lb

## 2024-07-26 DIAGNOSIS — E782 Mixed hyperlipidemia: Secondary | ICD-10-CM

## 2024-07-26 DIAGNOSIS — I10 Essential (primary) hypertension: Secondary | ICD-10-CM | POA: Diagnosis not present

## 2024-07-26 NOTE — Patient Instructions (Signed)
 Medication Instructions:  Your physician recommends that you continue on your current medications as directed. Please refer to the Current Medication list given to you today.  *If you need a refill on your cardiac medications before your next appointment, please call your pharmacy*  Lab Work: In 3 months (mid December): fasting Lipid panel and lipoprotein A  You may go to any of these [facility] locations: [facility] - [address] ([facility]) - [address] [phone] [address]. [address] - [address] (1st floor, next to pharmacy)   [place] - [address]    [place]  - [address]    [place] - [address] [employer]  - [address] ([facility])  [place]   - [address], [address]  If you have labs (blood work) drawn today and your tests are completely normal, you will receive your results only by: [employer] (if you have MyChart) OR A paper copy in the mail If you have any lab test that is abnormal or we need to change your treatment, we will call you to review the results.  Follow-Up: At [facility], you and your health needs are our priority.  As part of our continuing mission to provide you with exceptional heart care, our providers are all part of one team.  This team includes your primary Cardiologist (physician) and Advanced Practice Providers or APPs (Physician Assistants and Nurse Practitioners) who all work together to provide you with the care you need, when you need it.  Your next appointment:   1 year(s)  Provider:   Georganna Archer, MD   We recommend signing up for the patient portal called MyChart.  Sign up information is provided on this After Visit Summary.  MyChart is used to connect with patients for Virtual Visits (Telemedicine).  Patients are able to view  lab/test results, encounter notes, upcoming appointments, etc.  Non-urgent messages can be sent to your provider as well.    To learn more about what you can do with MyChart, go to [email or web address].

## 2024-07-26 NOTE — Assessment & Plan Note (Signed)
 Blood pressure is at goal.  Keep up the good work.

## 2024-07-26 NOTE — Assessment & Plan Note (Signed)
 Her most recent LDL was 84 which is higher than her usual LDL.  She endorsed missing several doses of her Repatha  lately simply due to forgetting.  Ideally her LDL will be as low as possible but is unclear to me exactly what her LDL goal should be.  She plans to start consistently taking her Repatha  again.  I will check a lipid panel and lipoprotein a in 3 months and can better determine what her LDL goal should be pending reassessment. -Lipid panel, lipoprotein a in 3 months -Follow-up in 12 months

## 2024-07-28 ENCOUNTER — Ambulatory Visit: Admitting: Family Medicine

## 2024-07-28 ENCOUNTER — Encounter: Payer: Self-pay | Admitting: Family Medicine

## 2024-07-28 VITALS — BP 116/84 | HR 77 | Temp 98.3°F | Resp 20 | Ht 69.0 in | Wt 187.4 lb

## 2024-07-28 DIAGNOSIS — Z23 Encounter for immunization: Secondary | ICD-10-CM | POA: Diagnosis not present

## 2024-07-28 DIAGNOSIS — R296 Repeated falls: Secondary | ICD-10-CM | POA: Diagnosis not present

## 2024-07-28 DIAGNOSIS — R5383 Other fatigue: Secondary | ICD-10-CM

## 2024-07-28 DIAGNOSIS — M6281 Muscle weakness (generalized): Secondary | ICD-10-CM

## 2024-07-28 NOTE — Patient Instructions (Signed)
 Follow up as needed or as scheduled We'll call you to schedule your physical therapy evaluation Call with any questions or concerns Stay Safe!  Stay Healthy! Hang in there!!

## 2024-07-28 NOTE — Progress Notes (Signed)
   Subjective:    Patient ID: [name], female    DOB: 11/04/1956, 68 y.o.   MRN: [id number]  HPI Decreased stamina- pt reports she is having a hard time building her stamina recently.  She has also had multiple falls in the last year due to tripping over various things.  She reports her muscle weakness and decreased stamina worsens her anxiety b/c she fears falling.  She is questioning whether physical therapy would be helpful.   Review of Systems For ROS see HPI     Objective:   Physical Exam Vitals reviewed.  Constitutional:      General: She is not in acute distress.    Appearance: Normal appearance. She is not ill-appearing.  HENT:     Head: Normocephalic and atraumatic.  Eyes:     Extraocular Movements: Extraocular movements intact.     Conjunctiva/sclera: Conjunctivae normal.  Cardiovascular:     Rate and Rhythm: Normal rate and regular rhythm.  Pulmonary:     Effort: Pulmonary effort is normal. No respiratory distress.  Skin:    General: Skin is warm and dry.  Neurological:     Mental Status: She is alert and oriented to person, place, and time. Mental status is at baseline.     Cranial Nerves: No cranial nerve deficit.  Psychiatric:        Mood and Affect: Mood normal.        Behavior: Behavior normal.        Thought Content: Thought content normal.           Assessment & Plan:  Decreased stamina/muscle weakness/multiple falls- ongoing for pt.  This has caused her anxiety to worsen b/c she wants to walk her dogs but is fearful she may fall when she's far from the house.  Would like to do PT for strength and balance.  Referral placed.

## 2024-08-02 ENCOUNTER — Ambulatory Visit: Admitting: Family Medicine

## 2024-08-09 ENCOUNTER — Telehealth: Payer: Self-pay

## 2024-08-09 NOTE — Telephone Encounter (Unsigned)
 Copied from CRM [id number]. Topic: Referral - Status >> Aug 09, 2024 10:56 AM Aleatha BROCKS wrote: Reason for CRM: Patient is calling because she needs the information and number for the physical therapy number that was referred to her

## 2024-08-16 ENCOUNTER — Ambulatory Visit: Admitting: Physical Therapy

## 2024-08-20 NOTE — Therapy (Unsigned)
 OUTPATIENT PHYSICAL THERAPY THORACOLUMBAR EVALUATION   Patient Name: [name] MRN: [id number] DOB:1956-04-07, 68 y.o., female Today's Date: 08/20/2024  END OF SESSION:   Past Medical History:  Diagnosis Date   Allergic rhinitis    Allergy    Anxiety    Arthritis    R knee, Left Knee   Asthma    Bipolar disorder (HCC)    Cataract    Complication of anesthesia    had bad anxiety after anesthesia   Constipation, chronic    Depression    Diabetes mellitus without complication (HCC)    Dysrhythmia    Palpations occ, takes Tenormin    GERD (gastroesophageal reflux disease)    Glaucoma    Heart murmur    Hx of adenomatous colonic polyps 2008   Hyperlipidemia    Hypertension    MVP (mitral valve prolapse)    Sleep apnea    wears CPAP   Urinary tract infection    frequent, see Urololgist 12/16/2010   Past Surgical History:  Procedure Laterality Date   ABDOMINAL HYSTERECTOMY  2002   no oophorectomy   BACK SURGERY  2001   L5 - S1   CATARACT EXTRACTION     COLONOSCOPY     NASAL SINUS SURGERY  01/2010   x2   TONSILLECTOMY     TOTAL KNEE ARTHROPLASTY  12/23/2011   Procedure: TOTAL KNEE ARTHROPLASTY;  Surgeon: Toribio JULIANNA Chancy, MD;  Location: [facility] OR;  Service: Orthopedics;  Laterality: Right;  DR MURPHY WANTS 120 MINUTES FOR SURGERY   TOTAL KNEE ARTHROPLASTY  08/31/2012   Procedure: TOTAL KNEE ARTHROPLASTY;  Surgeon: Toribio JULIANNA Chancy, MD;  Location: [facility] OR;  Service: Orthopedics;  Laterality: Left;   Patient Active Problem List   Diagnosis Date Noted   Iron deficiency 06/08/2024   B12 deficiency 06/08/2024   Fracture of third lumbar vertebra (HCC) 04/21/2024   Neuropathy 05/27/2023   Anxiety 07/20/2022   Hx of adenomatous colonic polyps 07/2021   Sleep related hypoxia 12/17/2020   Cervical myelopathy (HCC) 10/16/2020   Lumbar radiculopathy 10/16/2020   Positive ANA (antinuclear antibody) 08/14/2020   Chronically dry eyes, bilateral 08/14/2020   Dry mouth 08/14/2020    Allergic rhinitis due to pollen 12/13/2018   Diabetes mellitus without complication (HCC) 02/09/2018   Statin myopathy 11/04/2017   Family history of early CAD 03/10/2017   HTN (hypertension) 02/15/2017   Herniated nucleus pulposus, L5-S1 06/22/2014   Asthma, moderate persistent 10/09/2013   Obesity (BMI 30-39.9) 10/09/2013   Fatigue 12/12/2012   GERD (gastroesophageal reflux disease) 09/02/2012   Physical exam 07/03/2011   Vitamin D  deficiency 09/15/2010   DEPRESSIVE DISORDER 07/17/2010   RHINITIS 06/14/2009   MUSCLE WEAKNESS (GENERALIZED) 01/03/2009   Hyperlipidemia 10/10/2008   Bipolar 1 disorder (HCC) 10/10/2008   ADD 10/10/2008    PCP: PIERRETTE  REFERRING PROVIDER: ***  REFERRING DIAG: ***  Rationale for Evaluation and Treatment: {HABREHAB:27488}  THERAPY DIAG:  No diagnosis found.  ONSET DATE: ***  SUBJECTIVE:  SUBJECTIVE STATEMENT: ***  PERTINENT HISTORY:  ***  PAIN:  Are you having pain? {OPRCPAIN:27236}  PRECAUTIONS: {Therapy precautions:24002}  RED FLAGS: {PT Red Flags:29287}   WEIGHT BEARING RESTRICTIONS: {Yes ***/No:24003}  FALLS:  Has patient fallen in last 6 months? {fallsyesno:27318}  LIVING ENVIRONMENT: Lives with: {OPRC lives with:25569::lives with their family} Lives in: {Lives in:25570} Stairs: {opstairs:27293} Has following equipment at home: {Assistive devices:23999}  OCCUPATION: ***  PLOF: {PLOF:24004}  PATIENT GOALS: ***  NEXT MD VISIT: ***  OBJECTIVE:  Note: Objective measures were completed at Evaluation unless otherwise noted.  DIAGNOSTIC FINDINGS:  ***  PATIENT SURVEYS:  {rehab surveys:24030}  COGNITION: Overall cognitive status: {cognition:24006}     SENSATION: {sensation:27233}  MUSCLE LENGTH: Hamstrings: Right *** deg;  Left *** deg Debby test: Right *** deg; Left *** deg  POSTURE: {posture:25561}  PALPATION: ***  LUMBAR ROM:   AROM eval  Flexion   Extension   Right lateral flexion   Left lateral flexion   Right rotation   Left rotation    (Blank rows = not tested)  LOWER EXTREMITY ROM:     {AROM/PROM:27142}  Right eval Left eval  Hip flexion    Hip extension    Hip abduction    Hip adduction    Hip internal rotation    Hip external rotation    Knee flexion    Knee extension    Ankle dorsiflexion    Ankle plantarflexion    Ankle inversion    Ankle eversion     (Blank rows = not tested)  LOWER EXTREMITY MMT:    MMT Right eval Left eval  Hip flexion    Hip extension    Hip abduction    Hip adduction    Hip internal rotation    Hip external rotation    Knee flexion    Knee extension    Ankle dorsiflexion    Ankle plantarflexion    Ankle inversion    Ankle eversion     (Blank rows = not tested)  LUMBAR SPECIAL TESTS:  {lumbar special test:25242}  FUNCTIONAL TESTS:  {Functional tests:24029}  GAIT: Distance walked: *** Assistive device utilized: {Assistive devices:23999} Level of assistance: {Levels of assistance:24026} Comments: ***  TREATMENT DATE: ***                                                                                                                                 PATIENT EDUCATION:  Education details: *** Person educated: {Person educated:25204} Education method: {Education Method:25205} Education comprehension: {Education Comprehension:25206}  HOME EXERCISE PROGRAM: ***  ASSESSMENT:  CLINICAL IMPRESSION: Patient is a *** y.o. *** who was seen today for physical therapy evaluation and treatment for ***.   OBJECTIVE IMPAIRMENTS: {opptimpairments:25111}.   ACTIVITY LIMITATIONS: {activitylimitations:27494}  PARTICIPATION LIMITATIONS: {participationrestrictions:25113}  PERSONAL FACTORS: {Personal factors:25162} are also affecting  patient's functional outcome.   REHAB POTENTIAL: {rehabpotential:25112}  CLINICAL DECISION MAKING: {clinical decision making:25114}  EVALUATION COMPLEXITY: {Evaluation complexity:25115}  GOALS: Goals reviewed with patient? {yes/no:20286}  SHORT TERM GOALS: Target date: ***  *** Baseline: Goal status: INITIAL  2.  *** Baseline:  Goal status: INITIAL  3.  *** Baseline:  Goal status: INITIAL  4.  *** Baseline:  Goal status: INITIAL  5.  *** Baseline:  Goal status: INITIAL  6.  *** Baseline:  Goal status: INITIAL  LONG TERM GOALS: Target date: ***  *** Baseline:  Goal status: INITIAL  2.  *** Baseline:  Goal status: INITIAL  3.  *** Baseline:  Goal status: INITIAL  4.  *** Baseline:  Goal status: INITIAL  5.  *** Baseline:  Goal status: INITIAL  6.  *** Baseline:  Goal status: INITIAL  PLAN:  PT FREQUENCY: {rehab frequency:25116}  PT DURATION: {rehab duration:25117}  PLANNED INTERVENTIONS: {rehab planned interventions:25118::97110-Therapeutic exercises,97530- Therapeutic [phone]- Neuromuscular re-education,97535- Self Rjmz,02859- Manual therapy,Patient/Family education}.  PLAN FOR NEXT SESSION: ***   Tatyanna Cronk, PT 08/20/2024, 3:29 PM

## 2024-08-21 ENCOUNTER — Ambulatory Visit: Admitting: Physical Therapy

## 2024-08-21 ENCOUNTER — Encounter: Payer: Self-pay | Admitting: Physical Therapy

## 2024-08-21 DIAGNOSIS — M6281 Muscle weakness (generalized): Secondary | ICD-10-CM | POA: Diagnosis not present

## 2024-08-21 DIAGNOSIS — M5459 Other low back pain: Secondary | ICD-10-CM | POA: Diagnosis not present

## 2024-08-21 DIAGNOSIS — R2689 Other abnormalities of gait and mobility: Secondary | ICD-10-CM | POA: Diagnosis not present

## 2024-08-22 ENCOUNTER — Other Ambulatory Visit: Payer: Self-pay | Admitting: Student in an Organized Health Care Education/Training Program

## 2024-08-22 DIAGNOSIS — E119 Type 2 diabetes mellitus without complications: Secondary | ICD-10-CM

## 2024-08-22 LAB — HM MAMMOGRAPHY

## 2024-08-23 ENCOUNTER — Encounter: Payer: Self-pay | Admitting: Family Medicine

## 2024-08-23 ENCOUNTER — Ambulatory Visit (INDEPENDENT_AMBULATORY_CARE_PROVIDER_SITE_OTHER)

## 2024-08-23 VITALS — Ht 69.0 in | Wt 187.0 lb

## 2024-08-23 DIAGNOSIS — Z1211 Encounter for screening for malignant neoplasm of colon: Secondary | ICD-10-CM | POA: Diagnosis not present

## 2024-08-23 DIAGNOSIS — Z0000 Encounter for general adult medical examination without abnormal findings: Secondary | ICD-10-CM | POA: Diagnosis not present

## 2024-08-23 NOTE — Progress Notes (Signed)
 Subjective:   [name] is a 68 y.o. who presents for a Medicare Wellness preventive visit.  As a reminder, Annual Wellness Visits don't include a physical exam, and some assessments may be limited, especially if this visit is performed virtually. We may recommend an in-person follow-up visit with your provider if needed.  Visit Complete: Virtual I connected with  [name] on 08/23/24 by a audio enabled telemedicine application and verified that I am speaking with the correct person using two identifiers.  Patient Location: Home  Provider Location: Home Office  I discussed the limitations of evaluation and management by telemedicine. The patient expressed understanding and agreed to proceed.  Vital Signs: Because this visit was a virtual/telehealth visit, some criteria may be missing or patient reported. Any vitals not documented were not able to be obtained and vitals that have been documented are patient reported.  VideoDeclined- This patient declined Librarian, academic. Therefore the visit was completed with audio only.  Persons Participating in Visit: Patient.  AWV Questionnaire: No: Patient Medicare AWV questionnaire was not completed prior to this visit.  Cardiac Risk Factors include: advanced age (>34men, >74 women);diabetes mellitus;hypertension;dyslipidemia     Objective:    Today's Vitals   08/23/24 1046  Weight: 187 lb (84.8 kg)  Height: 5' 9 (1.753 m)   Body mass index is 27.62 kg/m.     08/23/2024   10:52 AM 08/21/2024   11:51 AM 09/04/2023    4:37 PM 07/03/2023   10:14 PM 05/12/2023    9:09 AM 01/29/2023    3:35 PM 07/17/2022    5:00 PM  Advanced Directives  Does Patient Have a Medical Advance Directive? Yes Yes No No Yes Yes Yes  Type of Advance Directive Living will Healthcare Power of [place];Living will   Healthcare Power of [employer] of [place];Living will Healthcare Power of [place];Living will   Does patient want to make changes to medical advance directive?  No - Patient declined       Copy of Healthcare Power of Attorney in Chart?  No - copy requested   No - copy requested    Would patient like information on creating a medical advance directive?   No - Patient declined        Current Medications (verified) Outpatient Encounter Medications as of 08/23/2024  Medication Sig   Alirocumab  (PRALUENT ) 150 MG/ML SOAJ Inject 1 mL (150 mg total) into the skin every 14 (fourteen) days.   azelastine  (ASTELIN ) 0.1 % nasal spray PLACE 2 SPRAYS INTO EACH NOSTRIL TWO TIMES A DAY AS DIRECTED   blood glucose meter kit and supplies KIT Dispense based on patient and insurance preference. Use up to four times daily as directed.   Cholecalciferol  (VITAMIN D3) 50 MCG (2000 UT) CAPS Take 2,000 Units by mouth daily.   clonazePAM  (KLONOPIN ) 2 MG tablet Take 2 mg by mouth 2 (two) times daily as needed.   Cyanocobalamin  (VITAMIN B-12 PO) Take by mouth.   fenofibrate  (TRICOR ) 145 MG tablet Take 1 tablet (145 mg total) by mouth daily.   fexofenadine  (ALLEGRA ) 180 MG tablet Take 1 tablet (180 mg total) by mouth daily.   fluticasone  (FLONASE ) 50 MCG/ACT nasal spray SPRAY TWO SPRAYS IN EACH NOSTRIL ONCE DAILY   gabapentin  (NEURONTIN ) 600 MG tablet Take 1,200 mg by mouth 2 (two) times daily.    glucose blood test strip Use as instructed DX E11.0   IBUPROFEN  PO Take by mouth as needed.  lamoTRIgine  (LAMICTAL ) 200 MG tablet Take 200 mg by mouth 2 (two) times daily.   Lancets (ONETOUCH DELICA PLUS LANCET30G) MISC Use twice daily to test blood sugars as directed Dx- uncontrolled DM II   losartan  (COZAAR ) 25 MG tablet Take 1 tablet (25 mg total) by mouth daily.   LUMIGAN 0.01 % SOLN Place 1 drop into both eyes at bedtime.    meclizine  (ANTIVERT ) 25 MG tablet Take 1 tablet (25 mg total) by mouth 3 (three) times daily as needed for dizziness.   meloxicam  (MOBIC ) 15 MG tablet Take 1 tablet (15 mg total) by mouth daily  as needed for pain.   methocarbamol  (ROBAXIN ) 500 MG tablet Take 1 tablet (500 mg total) by mouth every 8 (eight) hours as needed for muscle spasms.   montelukast  (SINGULAIR ) 10 MG tablet Take 1 tablet (10 mg total) by mouth at bedtime.   Multiple Vitamin (MULTIVITAMIN WITH MINERALS) TABS tablet Take 1 tablet by mouth daily.   neomycin-polymyxin b-dexamethasone  (MAXITROL) 3.5-10000-0.1 SUSP daily at 6 (six) AM.   omega-3 acid ethyl esters (LOVAZA ) 1 g capsule TAKE 2 CAPSULES BY MOUTH 2 TIMES A DAY   OZEMPIC , 0.25 OR 0.5 MG/DOSE, 2 MG/3ML SOPN DIAL AND INJECT UNDER THE SKIN 0.5 MG WEEKLY   Pyridoxine HCl (VITAMIN B-6 PO) Take 1 tablet by mouth daily.   QUEtiapine  (SEROQUEL  XR) 400 MG 24 hr tablet Take 400 mg by mouth at bedtime.   QUEtiapine  (SEROQUEL ) 50 MG tablet Take 50 mg by mouth daily.   SUMAtriptan  (IMITREX ) 50 MG tablet Take 1 tablet (50 mg total) by mouth once for 1 dose. May repeat in 2 hours if headache persists or recurs.   timolol (TIMOPTIC) 0.5 % ophthalmic solution SMARTSIG:In Eye(s)   traMADol  (ULTRAM ) 50 MG tablet Take by mouth.   venlafaxine  (EFFEXOR -XR) 75 MG 24 hr capsule Take 75 mg by mouth. Take 2 tabs in the am and 1 tab in afternoonn   Cholecalciferol  1.25 MG (50000 UT) TABS Take 1 tablet by mouth once a week. (Patient not taking: Reported on 08/23/2024)   CLARAVIS 20 MG capsule Take 20 mg by mouth daily. (Patient not taking: Reported on 08/23/2024)   No facility-administered encounter medications on file as of 08/23/2024.    Allergies (verified) Macrobid  [nitrofurantoin ], Nitrofurantoin  monohyd macro, Statins, Alprazolam, Amoxicillin , Prednisone, and Sulfa  antibiotics   History: Past Medical History:  Diagnosis Date   Allergic rhinitis    Allergy    Anxiety    Arthritis    R knee, Left Knee   Asthma    Bipolar disorder (HCC)    Cataract    Complication of anesthesia    had bad anxiety after anesthesia   Constipation, chronic    Depression    Diabetes  mellitus without complication (HCC)    Dysrhythmia    Palpations occ, takes Tenormin    GERD (gastroesophageal reflux disease)    Glaucoma    Heart murmur    Hx of adenomatous colonic polyps 2008   Hyperlipidemia    Hypertension    MVP (mitral valve prolapse)    Sleep apnea    wears CPAP   Urinary tract infection    frequent, see Urololgist 12/16/2010   Past Surgical History:  Procedure Laterality Date   ABDOMINAL HYSTERECTOMY  2002   no oophorectomy   BACK SURGERY  2001   L5 - S1   CATARACT EXTRACTION     COLONOSCOPY     NASAL SINUS SURGERY  01/2010   x2  TONSILLECTOMY     TOTAL KNEE ARTHROPLASTY  12/23/2011   Procedure: TOTAL KNEE ARTHROPLASTY;  Surgeon: Toribio JULIANNA Chancy, MD;  Location: [facility] OR;  Service: Orthopedics;  Laterality: Right;  DR MURPHY WANTS 120 MINUTES FOR SURGERY   TOTAL KNEE ARTHROPLASTY  08/31/2012   Procedure: TOTAL KNEE ARTHROPLASTY;  Surgeon: Toribio JULIANNA Chancy, MD;  Location: [facility] OR;  Service: Orthopedics;  Laterality: Left;   Family History  Problem Relation Age of Onset   Lung disease Mother        Mycobacterium avium intracellulare   Coronary artery disease Father    Heart attack Father 66   Heart disease Father    Hypertension Brother    Hyperlipidemia Brother    Alcohol abuse Brother    Anxiety disorder Brother    Healthy Brother    Diabetes Maternal Grandmother    Stroke Maternal Grandmother    Stroke Maternal Grandfather    Heart attack Paternal Grandmother    Anesthesia problems Neg Hx    Colon cancer Neg Hx    Esophageal cancer Neg Hx    Rectal cancer Neg Hx    Stomach cancer Neg Hx    Social History   Socioeconomic History   Marital status: Divorced    Spouse name: Not on file   Number of children: 0   Years of education: 13   Highest education level: Associate degree: academic program  Occupational History   Occupation: retired//disability    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 No kids. Grew up in [place]. Had a great childhood. Has 2 older brothers.   Mom was a stay at home mom. Her dad worked at [employer] as a Production manager. He died when pt was 68yo.    Never abused.       Disabled d/t Bipolar since 1992.   Religion-Christian   No legal issues.    Lives alone. Has 1 dog. Has pet sitting business.   2 cups of coffee a day.   Social Drivers of Health   Financial Resource Strain: Medium Risk (08/23/2024)   Overall Financial Resource Strain (CARDIA)    Difficulty of Paying Living Expenses: Somewhat hard  Food Insecurity: No Food Insecurity (08/23/2024)   Hunger Vital Sign    Worried About Running Out of Food in the Last Year: Never true    Ran Out of Food in the Last Year: Never true  Transportation Needs: No Transportation Needs (08/23/2024)   PRAPARE - Administrator, Civil Service (Medical): No    Lack of Transportation (Non-Medical): No  Physical Activity: Sufficiently Active (08/23/2024)   Exercise Vital Sign    Days of Exercise per Week: 7 days    Minutes of Exercise per Session: 30 min  Stress: No Stress Concern Present (08/23/2024)   [employer] of Occupational Health - Occupational Stress Questionnaire    Feeling of Stress: Only a little  Social Connections: Socially Isolated (08/23/2024)   Social  Connection and Isolation Panel    Frequency of Communication with Friends and Family: More than three times a week    Frequency of Social Gatherings with Friends and Family: Once a week    Attends Religious Services: Never     Database administrator or Organizations: No    Attends Engineer, structural: Never    Marital Status: Divorced    Tobacco Counseling Counseling given: Not Answered    Clinical Intake:  Pre-visit preparation completed: Yes  Pain : No/denies pain     BMI - recorded: 27.62 Nutritional Status: BMI 25 -29 Overweight Nutritional Risks: None Diabetes: Yes CBG done?: No Did pt. bring in CBG monitor from home?: No  Lab Results  Component Value Date   HGBA1C 5.6 04/21/2024   HGBA1C 5.9 01/05/2024   HGBA1C 5.6 08/13/2023     How often do you need to have someone help you when you read instructions, pamphlets, or other written materials from your doctor or pharmacy?: 1 - Never  Interpreter Needed?: No  Information entered by :: Sadler Teschner, RMA   Activities of Daily Living     08/23/2024   10:48 AM  In your present state of health, do you have any difficulty performing the following activities:  Hearing? 0  Vision? 0  Difficulty concentrating or making decisions? 0  Walking or climbing stairs? 0  Dressing or bathing? 0  Doing errands, shopping? 0  Preparing Food and eating ? N  Using the Toilet? N  In the past six months, have you accidently leaked urine? N  Do you have problems with loss of bowel control? N  Managing your Medications? N  Managing your Finances? N  Housekeeping or managing your Housekeeping? N    Patient Care Team: Mahlon Comer BRAVO, MD as PCP - General (Family Medicine) Floretta Mallard, MD as PCP - Cardiology (Cardiology) Mahlon Comer BRAVO, MD Cleotilde Ronal RAMAN, MD as Consulting Physician (Gynecology) Dann Candyce RAMAN, MD as Consulting Physician (Cardiology) Karis Clunes, MD as Consulting Physician (Otolaryngology) Noralyn Flock, MD as Referring Physician (Psychiatry) Kallie Medici, NP as Nurse Practitioner (Adult Health Nurse Practitioner) Leron Will ORN, LCSW as Social Worker (Licensed Clinical Social Worker) Buck Saucer,  MD as Attending Physician (Neurology) Nicholaus Sherlean CROME, [facility] (Inactive) (Pharmacist) I have updated your Care Teams any recent Medical Services you may have received from other providers in the past year.     Assessment:   This is a routine wellness examination for Shanae.  Hearing/Vision screen Hearing Screening - Comments:: Denies hearing difficulties   Vision Screening - Comments:: Denies vision issues./ Dr. CANDIE Pock ophthalmology     Goals Addressed             This Visit's Progress    Patient Stated       Would like to lose about 45 lbs/2025       Depression Screen     08/23/2024   11:03 AM 03/17/2024    9:28 AM 11/23/2023   10:22 AM 07/08/2023    1:37 PM 07/02/2023   11:02 AM 05/27/2023    3:04 PM 05/12/2023    9:19 AM  PHQ 2/9 Scores  PHQ - 2 Score 1 1 0 0 0 0 0  PHQ- 9 Score 1 3 2  0 0 2 1    Fall Risk     08/23/2024   10:53 AM 03/17/2024    9:28 AM 02/17/2024    9:44 AM 01/19/2024   10:38 AM 10/12/2023  10:42 AM  Fall Risk   Falls in the past year? 1 1 0 0 1  Number falls in past yr: 1 0 0 0 1  Injury with Fall? 0 1 0 0 1  Risk for fall due to : Impaired balance/gait  No Fall Risks    Follow up Falls evaluation completed;Falls prevention discussed        MEDICARE RISK AT HOME:  Medicare Risk at Home Any stairs in or around the home?: Yes (outside steps) If so, are there any without handrails?: No Home free of loose throw rugs in walkways, pet beds, electrical cords, etc?: Yes Adequate lighting in your home to reduce risk of falls?: Yes Life alert?: No Use of a cane, walker or w/c?: No Grab bars in the bathroom?: No Shower chair or bench in shower?: No Elevated toilet seat or a handicapped toilet?: No  TIMED UP AND GO:  Was the test performed?  No  Cognitive Function: Declined/Normal: No cognitive concerns noted by patient or family. Patient alert, oriented, able to answer questions appropriately and recall recent events. No signs of  memory loss or confusion.    08/08/2019   10:57 AM 08/03/2018   10:40 AM 08/13/2015   10:25 AM  MMSE - Mini Mental State Exam  Orientation to time 5 5 5    Orientation to Place 5 5 5    Registration 3 3 3    Attention/ Calculation 5 5 5    Recall 3 2 1    Language- name 2 objects 2 2 2    Language- repeat 1 1 1   Language- follow 3 step command 3 3 3    Language- read & follow direction 1 1 1    Write a sentence 1 1 1    Copy design 1 1 1    Total score 30 29 28       Data saved with a previous flowsheet row definition        05/12/2023    9:31 AM 06/30/2022   10:27 AM  6CIT Screen  What Year? -- 0 points  What month? -- 0 points  What time? -- 0 points  Count back from 20 -- 0 points  Months in reverse -- 0 points  Repeat phrase -- 0 points  Total Score  0 points    Immunizations Immunization History  Administered Date(s) Administered   Fluad Trivalent(High Dose 65+) 08/13/2023   INFLUENZA, HIGH DOSE SEASONAL PF 07/28/2024   Influenza Split 09/02/2012   Influenza,inj,Quad PF,6+ Mos 10/09/2013, 08/13/2015, 08/17/2016, 07/28/2017, 08/03/2018, 08/08/2019, 07/20/2020, 08/05/2021, 07/16/2022   Moderna Sars-Covid-2 Vaccination 02/27/2019, 01/27/2020, 09/11/2020, 04/13/2021   PNEUMOCOCCAL CONJUGATE-20 10/13/2022   Pneumococcal Conjugate-13 03/11/2016   Pneumococcal Polysaccharide-23 09/02/2012   Tdap 07/15/2012, 07/25/2023   Zoster Recombinant(Shingrix) 11/10/2018, 06/09/2019    Screening Tests Health Maintenance  Topic Date Due   COVID-19 Vaccine (5 - 2025-26 season) 07/10/2024   Colonoscopy  07/16/2024   OPHTHALMOLOGY EXAM  08/02/2024   HEMOGLOBIN A1C  10/21/2024   Diabetic kidney evaluation - Urine ACR  01/04/2025   Diabetic kidney evaluation - eGFR measurement  04/21/2025   FOOT EXAM  07/28/2025   Mammogram  08/22/2025   Medicare Annual Wellness (AWV)  08/23/2025   DTaP/Tdap/Td (3 - Td or Tdap) 07/24/2033   Pneumococcal Vaccine: 50+ Years  Completed   Influenza Vaccine   Completed   DEXA SCAN  Completed   Hepatitis C Screening  Completed   Zoster Vaccines- Shingrix  Completed   Meningococcal B Vaccine  Aged Out  Health Maintenance Items Addressed: Referral sent to GI for colonoscopy, See Nurse Notes at the end of this note  Additional Screening:  Vision Screening: Recommended annual ophthalmology exams for early detection of glaucoma and other disorders of the eye. Is the patient up to date with their annual eye exam?  Yes  Who is the provider or what is the name of the office in which the patient attends annual eye exams? Dr. GORMAN Pock opthalmology  Dental Screening: Recommended annual dental exams for proper oral hygiene  Community Resource Referral / Chronic Care Management: CRR required this visit?  No   CCM required this visit?  No   Plan:    I have personally reviewed and noted the following in the patient's chart:   Medical and social history Use of alcohol, tobacco or illicit drugs  Current medications and supplements including opioid prescriptions. Patient is currently taking opioid prescriptions. Information provided to patient regarding non-opioid alternatives. Patient advised to discuss non-opioid treatment plan with their provider. Functional ability and status Nutritional status Physical activity Advanced directives List of other physicians Hospitalizations, surgeries, and ER visits in previous 12 months Vitals Screenings to include cognitive, depression, and falls Referrals and appointments  In addition, I have reviewed and discussed with patient certain preventive protocols, quality metrics, and best practice recommendations. A written personalized care plan for preventive services as well as general preventive health recommendations were provided to patient.   Yaelis Scharfenberg L Rayon Mcchristian, CMA   08/23/2024   After Visit Summary: (MyChart) Due to this being a telephonic visit, the after visit summary with patients  personalized plan was offered to patient via MyChart   Notes: Patient is due for a colonoscopy and order has been placed today.  Patient stated that she has had an eye exam for this year.  I have sent a request for records out today.  She had no other concerns to address today.

## 2024-08-23 NOTE — Patient Instructions (Addendum)
 [name],  Thank you for taking the time for your Medicare Wellness Visit. I appreciate your continued commitment to your health goals. Please review the care plan we discussed, and feel free to reach out if I can assist you further.  Medicare recommends these wellness visits once per year to help you and your care team stay ahead of potential health issues. These visits are designed to focus on prevention, allowing your provider to concentrate on managing your acute and chronic conditions during your regular appointments.  Please note that Annual Wellness Visits do not include a physical exam. Some assessments may be limited, especially if the visit was conducted virtually. If needed, we may recommend a separate in-person follow-up with your provider.  Ongoing Care Seeing your primary care provider every 3 to 6 months helps us  monitor your health and provide consistent, personalized care. Last office visit on 08/03/2024.  Keep up the good work, moving.   Referrals If a referral was made during today's visit and you haven't received any updates within two weeks, please contact the referred provider directly to check on the status.  Please call to get scheduled for a colonoscopy at: [facility],  Located in: [facility] [address] Address: [address], [place], [place] [address] Phone: [phone].     Recommended Screenings:  Health Maintenance  Topic Date Due   DTaP/Tdap/Td vaccine (2 - Td or Tdap) 07/15/2022   COVID-19 Vaccine (5 - 2025-26 season) 07/10/2024   Colon Cancer Screening  07/16/2024   Eye exam for diabetics  08/02/2024   Hemoglobin A1C  10/21/2024   Yearly kidney health urinalysis for diabetes  01/04/2025   Yearly kidney function blood test for diabetes  04/21/2025   Complete foot exam   07/28/2025   Breast Cancer Screening  08/22/2025   Medicare Annual Wellness Visit  08/23/2025   Pneumococcal Vaccine for age over  54  Completed   Flu Shot  Completed   DEXA scan (bone density measurement)  Completed   Hepatitis C Screening  Completed   Zoster (Shingles) Vaccine  Completed   Meningitis B Vaccine  Aged Out       08/23/2024   10:52 AM  Advanced Directives  Does Patient Have a Medical Advance Directive? Yes  Type of Advance Directive Living will   Advance Care Planning is important because it: Ensures you receive medical care that aligns with your values, goals, and preferences. Provides guidance to your family and loved ones, reducing the emotional burden of decision-making during critical moments.  Vision: Annual vision screenings are recommended for early detection of glaucoma, cataracts, and diabetic retinopathy. These exams can also reveal signs of chronic conditions such as diabetes and high blood pressure.  Dental: Annual dental screenings help detect early signs of oral cancer, gum disease, and other conditions linked to overall health, including heart disease and diabetes.  Please see the attached documents for additional preventive care recommendations.

## 2024-08-31 ENCOUNTER — Encounter: Payer: Self-pay | Admitting: Family Medicine

## 2024-08-31 ENCOUNTER — Encounter: Admitting: Physical Therapy

## 2024-08-31 ENCOUNTER — Ambulatory Visit: Admitting: Family Medicine

## 2024-08-31 VITALS — BP 110/60 | HR 72 | Temp 97.7°F | Resp 16 | Ht 69.0 in | Wt 198.0 lb

## 2024-08-31 DIAGNOSIS — J329 Chronic sinusitis, unspecified: Secondary | ICD-10-CM

## 2024-08-31 DIAGNOSIS — B9689 Other specified bacterial agents as the cause of diseases classified elsewhere: Secondary | ICD-10-CM

## 2024-08-31 DIAGNOSIS — E782 Mixed hyperlipidemia: Secondary | ICD-10-CM

## 2024-08-31 MED ORDER — FENOFIBRATE 145 MG PO TABS: 145.0000 mg | ORAL_TABLET | Freq: Every day | ORAL | 1 refills | Status: AC

## 2024-08-31 MED ORDER — DOXYCYCLINE HYCLATE 100 MG PO TABS
100.0000 mg | ORAL_TABLET | Freq: Two times a day (BID) | ORAL | 0 refills | Status: AC
Start: 2024-08-31 — End: ?

## 2024-08-31 NOTE — Progress Notes (Signed)
   Subjective:    Patient ID: [name], female    DOB: [birth year], 68 y.o.   MRN: [id number]  HPI URI- 'it feels like a sinus infxn'.  Pt reports sxs started ~10 days ago w/ headache, sinus pressure, ear fullness.  + fatigue.  No fever.  No tooth pain.  + PND but minimal cough.   Review of Systems For ROS see HPI     Objective:   Physical Exam Vitals reviewed.  Constitutional:      General: She is not in acute distress.    Appearance: Normal appearance. She is well-developed.  HENT:     Head: Normocephalic and atraumatic.     Right Ear: Tympanic membrane normal.     Left Ear: Tympanic membrane normal.     Nose: Mucosal edema and congestion present. No rhinorrhea.     Right Sinus: Maxillary sinus tenderness and frontal sinus tenderness present.     Left Sinus: Maxillary sinus tenderness and frontal sinus tenderness present.     Mouth/Throat:     Pharynx: Uvula midline. Posterior oropharyngeal erythema present. No oropharyngeal exudate.  Eyes:     Conjunctiva/sclera: Conjunctivae normal.     Pupils: Pupils are equal, round, and reactive to light.  Cardiovascular:     Rate and Rhythm: Normal rate and regular rhythm.     Heart sounds: Normal heart sounds.  Pulmonary:     Effort: Pulmonary effort is normal. No respiratory distress.     Breath sounds: Normal breath sounds. No wheezing.  Musculoskeletal:     Cervical back: Normal range of motion and neck supple.  Lymphadenopathy:     Cervical: No cervical adenopathy.  Skin:    General: Skin is warm and dry.  Neurological:     General: No focal deficit present.     Mental Status: She is alert and oriented to person, place, and time.     Cranial Nerves: No cranial nerve deficit.     Motor: No weakness.     Coordination: Coordination normal.  Psychiatric:        Mood and Affect: Mood normal.        Behavior: Behavior normal.        Thought Content: Thought content normal.           Assessment & Plan:  Bacterial  sinusitis- new.  Pt's sxs and PE consistent w/ infxn.  Start Doxy due to medication allergies to PCN and sulfa .  Reviewed supportive care and red flags that should prompt return.  Pt expressed understanding and is in agreement w/ plan.

## 2024-08-31 NOTE — Patient Instructions (Addendum)
Follow up as needed or as scheduled START the Doxycycline twice daily- take w/ food Drink LOTS of fluids REST! Call with any questions or concerns Hang in there!!! 

## 2024-09-05 ENCOUNTER — Telehealth: Payer: Self-pay

## 2024-09-05 NOTE — Telephone Encounter (Signed)
 Is this a side effect that is common? OTC medications are not working for patient. Please advise, thank you  Copied from CRM #[id number]. Topic: Clinical - Medication Question >> Sep 05, 2024 10:18 AM Burnard DEL wrote: Reason for CRM: Patient called in stating that she thinks that the doxycycline  (VIBRA -TABS) 100 MG tablet is causing her some acid reflux. Patient would like to know if she could be prescribed any medication for it.She stated that no OTC medication is helping her.  [id number] - [place], [place] - [address]  Phone: [phone] Fax: [phone]

## 2024-09-05 NOTE — Telephone Encounter (Signed)
 Doxycycline  can cause GI issues.  Be sure to take w/ food.  She can take 7 days of Doxy instead of 10 based on the side effects.  I would continue OTC meds like Famotidine or Omeprazole  and add Tums as needed.

## 2024-09-05 NOTE — Telephone Encounter (Signed)
Called patient and she verbalized understanding  °

## 2024-09-06 NOTE — Therapy (Unsigned)
 OUTPATIENT PHYSICAL THERAPY EVALUATION   Patient Name: [name] MRN: [id number] DOB:11-12-1955, 68 y.o., female Today's Date: 09/07/2024  END OF SESSION:  PT End of Session - 09/07/24 1014     Visit Number 2    Number of Visits 8    Date for Recertification  10/16/24    Authorization Type [employer] MCR    PT Start Time 1015    PT Stop Time 1100    PT Time Calculation (min) 45 min    Activity Tolerance Patient tolerated treatment well    Behavior During Therapy WFL for tasks assessed/performed           Past Medical History:  Diagnosis Date   Allergic rhinitis    Allergy    Anxiety    Arthritis    R knee, Left Knee   Asthma    Bipolar disorder (HCC)    Cataract    Complication of anesthesia    had bad anxiety after anesthesia   Constipation, chronic    Depression    Diabetes mellitus without complication (HCC)    Dysrhythmia    Palpations occ, takes Tenormin    GERD (gastroesophageal reflux disease)    Glaucoma    Heart murmur    Hx of adenomatous colonic polyps 2008   Hyperlipidemia    Hypertension    MVP (mitral valve prolapse)    Sleep apnea    wears CPAP   Urinary tract infection    frequent, see Urololgist 12/16/2010   Past Surgical History:  Procedure Laterality Date   ABDOMINAL HYSTERECTOMY  2002   no oophorectomy   BACK SURGERY  2001   L5 - S1   CATARACT EXTRACTION     COLONOSCOPY     NASAL SINUS SURGERY  01/2010   x2   TONSILLECTOMY     TOTAL KNEE ARTHROPLASTY  12/23/2011   Procedure: TOTAL KNEE ARTHROPLASTY;  Surgeon: Toribio JULIANNA Chancy, MD;  Location: [facility] OR;  Service: Orthopedics;  Laterality: Right;  DR MURPHY WANTS 120 MINUTES FOR SURGERY   TOTAL KNEE ARTHROPLASTY  08/31/2012   Procedure: TOTAL KNEE ARTHROPLASTY;  Surgeon: Toribio JULIANNA Chancy, MD;  Location: [facility] OR;  Service: Orthopedics;  Laterality: Left;   Patient Active Problem List   Diagnosis Date Noted   Iron deficiency 06/08/2024   B12 deficiency 06/08/2024   Fracture of third lumbar  vertebra (HCC) 04/21/2024   Neuropathy 05/27/2023   Anxiety 07/20/2022   Hx of adenomatous colonic polyps 07/2021   Sleep related hypoxia 12/17/2020   Cervical myelopathy (HCC) 10/16/2020   Lumbar radiculopathy 10/16/2020   Positive ANA (antinuclear antibody) 08/14/2020   Chronically dry eyes, bilateral 08/14/2020   Dry mouth 08/14/2020   Allergic rhinitis due to pollen 12/13/2018   Diabetes mellitus without complication (HCC) 02/09/2018   Statin myopathy 11/04/2017   Family history of early CAD 03/10/2017   HTN (hypertension) 02/15/2017   Herniated nucleus pulposus, L5-S1 06/22/2014   Asthma, moderate persistent 10/09/2013   Obesity (BMI 30-39.9) 10/09/2013   Fatigue 12/12/2012   GERD (gastroesophageal reflux disease) 09/02/2012   Physical exam 07/03/2011   Vitamin D  deficiency 09/15/2010   DEPRESSIVE DISORDER 07/17/2010   RHINITIS 06/14/2009   MUSCLE WEAKNESS (GENERALIZED) 01/03/2009   Hyperlipidemia 10/10/2008   Bipolar 1 disorder (HCC) 10/10/2008   ADD 10/10/2008    PCP: Mahlon Comer BRAVO MD   REFERRING PROVIDER: Mahlon Comer BRAVO, MD  REFERRING DIAG: R29.6 (ICD-10-CM) - Multiple falls R53.83 (ICD-10-CM) - Decreased stamina M62.81 (ICD-10-CM) - Muscle weakness  Rationale for Evaluation and Treatment: Rehabilitation  THERAPY DIAG:  Other abnormalities of gait and mobility  Muscle weakness (generalized)  Other low back pain  ONSET DATE: Chronic over the past few months  SUBJECTIVE:                                                                                                                                                                                           SUBJECTIVE STATEMENT: I had a sinus infection.  I wasn't able to do anything.    Patient presents with referral to physical therapy for general balance and weakness.  She recently noted things that would make her trip and stumble a bit.  She has had a few falls in the past 6 months.  She reports her  balance has declined over the years.  And she has become fearful.   I just feel like I've lost strength and I want to get it back  Patient reports an incident where she missed a curb as she was out, missed the step and fell.  She has no numbness or neuropathy in her feet.  She reports rare dizziness.  She is on a lot of meds and wonders if that might be an issue.  She used to be an active person and would [facility]   PERTINENT HISTORY:  Back pain, lumbar surgery,  TKA x 2 , depression, diabetes, bipolar, anxiety, hypertension   PAIN:  Are you having pain? Yes: NPRS scale: typical, 5/10  Pain location: low back at times in her legs  Pain description: tight  Aggravating factors: prolonged periods of time sitting or standing  Relieving factors: Ibuprofen , moving , walking   PRECAUTIONS: Fall  RED FLAGS: None   WEIGHT BEARING RESTRICTIONS: No  FALLS:  Has patient fallen in last 6 months? Yes. 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  DIAGNOSTIC FINDINGS:  IMPRESSION: 1. Prior laminectomy at L5-S1. 2. Mild canal and foraminal stenosis bilaterally at L4-L5 secondary to disc bulging and facet arthropathy. 3. Mild foraminal  stenosis at L2-L3 and L5-S1.   PATIENT SURVEYS:  PSFS: THE PATIENT SPECIFIC FUNCTIONAL SCALE  Place score of 0-10 (0 = unable to perform activity and 10 = able to perform activity at the same level  as before injury or problem)  Activity Date: 08/21/24    Walking dog 5    2.  Standing long periods 5    3.  Bending to scoop litter or lift a pet 5    4.      Total Score 15      Total Score = Sum of activity scores/number of activities  Minimally Detectable Change: 3 points (for single activity); 2 points (for average score)  [facility] (nd). The Patient Specific Functional Scale . Retrieved from [email or web address]   COGNITION: Overall cognitive status: Within functional limits for tasks assessed     SENSATION: WFL   POSTURE: rounded shoulders and forward head  PALPATION: Did not palpate lumbar spine  LUMBAR ROM: WFL other than ext.   AROM eval  Flexion   Extension Limited 25%   Right lateral flexion   Left lateral flexion   Right rotation   Left rotation    (Blank rows = not tested)  LOWER EXTREMITY ROM:   WNL  Active  Right eval Left eval  Hip flexion    Hip extension    Hip abduction    Hip adduction    Hip internal rotation    Hip external rotation    Knee flexion    Knee extension    Ankle dorsiflexion    Ankle plantarflexion    Ankle inversion    Ankle eversion     (Blank rows = not tested)  LOWER EXTREMITY MMT:    MMT Right eval Left eval  Hip flexion 4 4  Hip extension 3+ 3+  Hip abduction 3+ 3+  Hip adduction    Hip internal rotation    Hip external rotation    Knee flexion 5 5  Knee extension 5 5  Ankle dorsiflexion 5 5  Ankle plantarflexion    Ankle inversion    Ankle eversion     (Blank rows = not tested)  LUMBAR SPECIAL TESTS:  NT   FUNCTIONAL TESTS:  5 times sit to stand: 12.6 sec  2 minute walk test: 480 feet, legs tired, HR 86, 02 96%  GAIT: Distance walked: 480 Assistive device utilized: None Level of assistance: Complete Independence Comments: LE fatigue , VSS   TREATMENT DATE:   [facility] Adult PT Treatment:                                                 DATE: 09/07/24 Therapeutic Exercise: Recumbent bike L1 for 5 min  Lower trunk SLR flex, abduction  Bridge  Hip abduction  Heel raises Therapeutic Activity: Simple balance on and off Airex  Tandem and SLS added to HEP   [facility] Adult PT Treatment:                                                DATE: 08/21/24 Therapeutic Activity: 2 min walk  Berg Balance  HEP issued  Self Care: See pt. education  PATIENT EDUCATION:  Education details: PT, balance, Berg score and reasons for gait instability  Person educated: Patient Education method: Explanation, Demonstration, Verbal cues, and Handouts Education comprehension: verbalized understanding and returned demonstration  HOME EXERCISE PROGRAM: Access Code: [email or web address] URL: [email or web address] Date: 08/21/2024 Prepared by: Delon Norma  Exercises - Sit to Stand Without Arm Support  - 1 x daily - 7 x weekly - 2 sets - 10 reps - 5 hold - Supine Bridge  - 1-2 x daily - 7 x weekly - 2 sets - 10 reps - 5 hold - Supine Lower Trunk Rotation  - 1-2 x daily - 7 x weekly - 2 sets - 10 reps - 10-15 hold Tandem SLS   ASSESSMENT:  CLINICAL IMPRESSION: Patient was recently ill and was not able to do much, she is just now feeling like she is back to her baseline.  She was able to work through a combination of balance and strengthening exercises without increasing back or leg pain . She asked about going to the gym and I told her she could start gradually and with lighter weights, if she felt up to it.  Patient will continue to benefit from skilled PT in order to return to PLOF and optimize functional mobility.      Patient is a 68 y.o. female who was seen today for physical therapy evaluation and treatment for balance and general weakness.  Assessment showed generalized hip and core weakness some  mild lack of coordination but no red flags.  Will incorporate some general lumbar treatment as a pertains to her standing and balance.  OBJECTIVE IMPAIRMENTS: decreased activity tolerance, decreased balance, decreased coordination, decreased endurance, decreased mobility, difficulty walking, decreased strength, increased fascial restrictions, impaired flexibility, postural dysfunction, obesity, and pain.   ACTIVITY LIMITATIONS: carrying, lifting, bending, sitting, standing, stairs, locomotion level, and caring for others  PARTICIPATION LIMITATIONS: cleaning, interpersonal relationship, shopping, community activity, and occupation  PERSONAL FACTORS: Time since onset of injury/illness/exacerbation and 3+ comorbidities: Depression anxiety back pain are also affecting patient's functional outcome.   REHAB POTENTIAL: Excellent  CLINICAL DECISION MAKING: Stable/uncomplicated  EVALUATION COMPLEXITY: Low   GOALS: Goals reviewed with patient? Yes  SHORT TERM GOALS: Target date: 09/18/2024    Patient will be independent with initial home exercise program Baseline: Goal status: INITIAL  2.  Patient will be able to perform proper hip hinging and lifting in order to preserve her back while caring for her pets Baseline:  Goal status: INITIAL  3.  Patient will report consistent walking 3 days a week in order to work toward improved fitness Baseline:  Goal status: INITIAL  LONG TERM GOALS: Target date: 10/16/2024    Patient will be independent with final HEP upon discharge from PT and report consistent benefit following exercise completion.    Baseline:  Goal status: INITIAL  2.  Pt will be able to demonstrate good standing balance as evidenced by balance recovery from min to mod dynamic balance challenges.   Baseline:  Goal status: INITIAL  3.  Patient will be able to demonstrate a floor transfer for fall recovery Baseline:  Goal status: INITIAL  4.  Patient will improve Berg  balance score to 54/56 Baseline: 50/56 Goal status: INITIAL  5.  Patient will improve 2-minute walk time by 50 feet as a proxy for improved functional endurance and mobility Baseline: 480 Goal status: INITIAL  6. Patient will use the home gym at her Apt. 2 days a week for strengthening and general fitness Baseline:  50/56 Goal status: INITIAL  PLAN:  PT FREQUENCY: 1x/week  PT DURATION: 8 weeks  PLANNED INTERVENTIONS: 97164- PT Re-evaluation, 97750- Physical Performance Testing, 97110-Therapeutic exercises, 97530- Therapeutic activity, [id number]- Neuromuscular re-education, 97535- Self Care, [id number]- Manual therapy, [id number]- Gait training, Patient/Family education, and Balance training.  PLAN FOR NEXT SESSION: Balance core posture hinging   Jailyne Chieffo, PT 09/07/2024, 10:15 AM   Delon Norma, PT 09/07/24 10:15 AM Phone: [phone] Fax: [phone]

## 2024-09-06 NOTE — Addendum Note (Signed)
 Addended by: GERSHON COUGH R on: 09/06/2024 03:01 PM   Modules accepted: Level of Service

## 2024-09-07 ENCOUNTER — Ambulatory Visit: Admitting: Physical Therapy

## 2024-09-07 ENCOUNTER — Encounter: Payer: Self-pay | Admitting: Physical Therapy

## 2024-09-07 DIAGNOSIS — M6281 Muscle weakness (generalized): Secondary | ICD-10-CM

## 2024-09-07 DIAGNOSIS — M5459 Other low back pain: Secondary | ICD-10-CM

## 2024-09-07 DIAGNOSIS — R2689 Other abnormalities of gait and mobility: Secondary | ICD-10-CM

## 2024-09-13 ENCOUNTER — Ambulatory Visit: Admitting: Physical Therapy

## 2024-09-13 ENCOUNTER — Encounter: Payer: Self-pay | Admitting: Physical Therapy

## 2024-09-13 DIAGNOSIS — M6281 Muscle weakness (generalized): Secondary | ICD-10-CM

## 2024-09-13 DIAGNOSIS — M5459 Other low back pain: Secondary | ICD-10-CM

## 2024-09-13 DIAGNOSIS — R2689 Other abnormalities of gait and mobility: Secondary | ICD-10-CM | POA: Diagnosis not present

## 2024-09-13 NOTE — Therapy (Signed)
 OUTPATIENT PHYSICAL THERAPY NOTE   Patient Name: [name] MRN: [id number] DOB:05/19/1956, 68 y.o., female Today's Date: 09/13/2024  END OF SESSION:  PT End of Session - 09/13/24 1020     Visit Number 3    Number of Visits 8    Date for Recertification  10/16/24    Authorization Type [employer] MCR    PT Start Time 1020    PT Stop Time 1100    PT Time Calculation (min) 40 min    Activity Tolerance Patient tolerated treatment well    Behavior During Therapy WFL for tasks assessed/performed            Past Medical History:  Diagnosis Date   Allergic rhinitis    Allergy    Anxiety    Arthritis    R knee, Left Knee   Asthma    Bipolar disorder (HCC)    Cataract    Complication of anesthesia    had bad anxiety after anesthesia   Constipation, chronic    Depression    Diabetes mellitus without complication (HCC)    Dysrhythmia    Palpations occ, takes Tenormin    GERD (gastroesophageal reflux disease)    Glaucoma    Heart murmur    Hx of adenomatous colonic polyps 2008   Hyperlipidemia    Hypertension    MVP (mitral valve prolapse)    Sleep apnea    wears CPAP   Urinary tract infection    frequent, see Urololgist 12/16/2010   Past Surgical History:  Procedure Laterality Date   ABDOMINAL HYSTERECTOMY  2002   no oophorectomy   BACK SURGERY  2001   L5 - S1   CATARACT EXTRACTION     COLONOSCOPY     NASAL SINUS SURGERY  01/2010   x2   TONSILLECTOMY     TOTAL KNEE ARTHROPLASTY  12/23/2011   Procedure: TOTAL KNEE ARTHROPLASTY;  Surgeon: Toribio JULIANNA Chancy, MD;  Location: [facility] OR;  Service: Orthopedics;  Laterality: Right;  DR MURPHY WANTS 120 MINUTES FOR SURGERY   TOTAL KNEE ARTHROPLASTY  08/31/2012   Procedure: TOTAL KNEE ARTHROPLASTY;  Surgeon: Toribio JULIANNA Chancy, MD;  Location: [facility] OR;  Service: Orthopedics;  Laterality: Left;   Patient Active Problem List   Diagnosis Date Noted   Iron deficiency 06/08/2024   B12 deficiency 06/08/2024   Fracture of third lumbar  vertebra (HCC) 04/21/2024   Neuropathy 05/27/2023   Anxiety 07/20/2022   Hx of adenomatous colonic polyps 07/2021   Sleep related hypoxia 12/17/2020   Cervical myelopathy (HCC) 10/16/2020   Lumbar radiculopathy 10/16/2020   Positive ANA (antinuclear antibody) 08/14/2020   Chronically dry eyes, bilateral 08/14/2020   Dry mouth 08/14/2020   Allergic rhinitis due to pollen 12/13/2018   Diabetes mellitus without complication (HCC) 02/09/2018   Statin myopathy 11/04/2017   Family history of early CAD 03/10/2017   HTN (hypertension) 02/15/2017   Herniated nucleus pulposus, L5-S1 06/22/2014   Asthma, moderate persistent 10/09/2013   Obesity (BMI 30-39.9) 10/09/2013   Fatigue 12/12/2012   GERD (gastroesophageal reflux disease) 09/02/2012   Physical exam 07/03/2011   Vitamin D  deficiency 09/15/2010   DEPRESSIVE DISORDER 07/17/2010   RHINITIS 06/14/2009   MUSCLE WEAKNESS (GENERALIZED) 01/03/2009   Hyperlipidemia 10/10/2008   Bipolar 1 disorder (HCC) 10/10/2008   ADD 10/10/2008    PCP: Mahlon Comer BRAVO MD   REFERRING PROVIDER: Mahlon Comer BRAVO, MD  REFERRING DIAG: R29.6 (ICD-10-CM) - Multiple falls R53.83 (ICD-10-CM) - Decreased stamina M62.81 (ICD-10-CM) - Muscle  weakness  Rationale for Evaluation and Treatment: Rehabilitation  THERAPY DIAG:  Other abnormalities of gait and mobility  Muscle weakness (generalized)  Other low back pain  ONSET DATE: Chronic over the past few months  SUBJECTIVE:                                                                                                                                                                                           SUBJECTIVE STATEMENT: I was really sore I may have overdid it at home.  Doing daily exercises and walking.     Patient presents with referral to physical therapy for general balance and weakness.  She recently noted things that would make her trip and stumble a bit.  She has had a few falls in the  past 6 months.  She reports her balance has declined over the years.  And she has become fearful.   I just feel like I've lost strength and I want to get it back  Patient reports an incident where she missed a curb as she was out, missed the step and fell.  She has no numbness or neuropathy in her feet.  She reports rare dizziness.  She is on a lot of meds and wonders if that might be an issue.  She used to be an active person and would [facility]   PERTINENT HISTORY:  Back pain, lumbar surgery,  TKA x 2 , depression, diabetes, bipolar, anxiety, hypertension   PAIN:  Are you having pain? Yes: NPRS scale: typical, 5/10  Pain location: low back at times in her legs  Pain description: tight  Aggravating factors: prolonged periods of time sitting or standing  Relieving factors: Ibuprofen , moving , walking   PRECAUTIONS: Fall  RED FLAGS: None   WEIGHT BEARING RESTRICTIONS: No  FALLS:  Has patient fallen in last 6 months? Yes. 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  DIAGNOSTIC FINDINGS:  IMPRESSION: 1. Prior laminectomy at L5-S1. 2. Mild canal and foraminal stenosis bilaterally at L4-L5 secondary to  disc bulging and facet arthropathy. 3. Mild foraminal stenosis at L2-L3 and L5-S1.   PATIENT SURVEYS:  PSFS: THE PATIENT SPECIFIC FUNCTIONAL SCALE  Place score of 0-10 (0 = unable to perform activity and 10 = able to  perform activity at the same level as before injury or problem)  Activity Date: 08/21/24    Walking dog 5    2.  Standing long periods 5    3.  Bending to scoop litter or lift a pet 5    4.      Total Score 15      Total Score = Sum of activity scores/number of activities  Minimally Detectable Change: 3 points (for single activity); 2 points (for average score)  [facility] (nd). The Patient Specific Functional Scale . Retrieved from [email or web address]   COGNITION: Overall cognitive status: Within functional limits for tasks assessed     SENSATION: WFL   POSTURE: rounded shoulders and forward head  PALPATION: Did not palpate lumbar spine  LUMBAR ROM: WFL other than ext.   AROM eval  Flexion   Extension Limited 25%   Right lateral flexion   Left lateral flexion   Right rotation   Left rotation    (Blank rows = not tested)  LOWER EXTREMITY ROM:   WNL  Active  Right eval Left eval  Hip flexion    Hip extension    Hip abduction    Hip adduction    Hip internal rotation    Hip external rotation    Knee flexion    Knee extension    Ankle dorsiflexion    Ankle plantarflexion    Ankle inversion    Ankle eversion     (Blank rows = not tested)  LOWER EXTREMITY MMT:    MMT Right eval Left eval  Hip flexion 4 4  Hip extension 3+ 3+  Hip abduction 3+ 3+  Hip adduction    Hip internal rotation    Hip external rotation    Knee flexion 5 5  Knee extension 5 5  Ankle dorsiflexion 5 5  Ankle plantarflexion    Ankle inversion    Ankle eversion     (Blank rows = not tested)  LUMBAR SPECIAL TESTS:  NT   FUNCTIONAL TESTS:  5 times sit to stand: 12.6 sec  2 minute walk test: 480 feet, legs tired, HR 86, 02 96%  GAIT: Distance walked: 480 Assistive device utilized: None Level of assistance: Complete Independence Comments: LE fatigue , VSS   TREATMENT DATE:    [facility] Adult PT Treatment:                                                 DATE: 09/13/24 Therapeutic Exercise: LTR x 10  Bridge  x 15  SLR x 10  Sidelying clam GTB x 15  Seated Figure 4, 2 x 20  Therapeutic Activity: Airex step ups Split lunge 1 foot on Airex Tandem 30 sec goal  SLS 10 sec goal  Gait with head turns, nods and pivot stops  [facility] Adult PT Treatment:  DATE: 09/07/24 Therapeutic Exercise: Recumbent bike L1 for 5 min  Lower trunk SLR flex, abduction  Bridge  Hip abduction  Heel raises Therapeutic Activity: Simple balance on and off Airex  Tandem and SLS added to HEP   [facility] Adult PT Treatment:                                                DATE: 08/21/24 Therapeutic Activity: 2 min walk  Berg Balance  HEP issued  Self Care: See pt. education                                                                                                                                  PATIENT EDUCATION:  Education details: PT, balance, Berg score and reasons for gait instability  Person educated: Patient Education method: Programmer, Multimedia, Demonstration, Verbal cues, and Handouts Education comprehension: verbalized understanding and returned demonstration  HOME EXERCISE PROGRAM: Access Code: [email or web address] URL: [email or web address] Date: 08/21/2024 Prepared by: Delon Norma  Exercises - Sit to Stand Without Arm Support  - 1 x daily - 7 x weekly - 2 sets - 10 reps - 5 hold - Supine Bridge  - 1-2 x daily - 7 x weekly - 2 sets - 10 reps - 5 hold - Supine Lower Trunk Rotation  - 1-2 x daily - 7 x weekly - 2 sets - 10 reps - 10-15 hold Tandem SLS  Piriforms and clam   ASSESSMENT:  CLINICAL IMPRESSION: Patient continues to have difficult with standing balance exercises when more dynamic tasks introduced.  Focused on hip and core strength which is limited as well.  Give exercises to ease soreness in outer hips as well. Patient will continue to  benefit from skilled PT in order to return to PLOF and optimize functional mobility.      Patient is a 68 y.o. female who was seen today for physical therapy evaluation and treatment for balance and general weakness.  Assessment showed generalized hip and core weakness some mild lack of coordination but no red flags.  Will incorporate some general lumbar treatment as a pertains to her standing and balance.  OBJECTIVE IMPAIRMENTS: decreased activity tolerance, decreased balance, decreased coordination, decreased endurance, decreased mobility, difficulty walking, decreased strength, increased fascial restrictions, impaired flexibility, postural dysfunction, obesity, and pain.   ACTIVITY LIMITATIONS: carrying, lifting, bending, sitting, standing, stairs, locomotion level, and caring for others  PARTICIPATION LIMITATIONS: cleaning, interpersonal relationship, shopping, community activity, and occupation  PERSONAL FACTORS: Time since onset of injury/illness/exacerbation and 3+ comorbidities: Depression anxiety back pain are also affecting patient's functional outcome.   REHAB POTENTIAL: Excellent  CLINICAL DECISION MAKING: Stable/uncomplicated  EVALUATION COMPLEXITY: Low   GOALS: Goals reviewed with patient? Yes  SHORT TERM GOALS: Target date: 09/18/2024    Patient will be independent with initial home  exercise program Baseline: Goal status: ongoing   2.  Patient will be able to perform proper hip hinging and lifting in order to preserve her back while caring for her pets Baseline:  Goal status: ongoing   3.  Patient will report consistent walking 3 days a week in order to work toward improved fitness Baseline:  Goal status:MET   LONG TERM GOALS: Target date: 10/16/2024    Patient will be independent with final HEP upon discharge from PT and report consistent benefit following exercise completion.    Baseline:  Goal status: INITIAL  2.  Pt will be able to demonstrate good  standing balance as evidenced by balance recovery from min to mod dynamic balance challenges.   Baseline:  Goal status: INITIAL  3.  Patient will be able to demonstrate a floor transfer for fall recovery Baseline:  Goal status: INITIAL  4.  Patient will improve Berg balance score to 54/56 Baseline: 50/56 Goal status: INITIAL  5.  Patient will improve 2-minute walk time by 50 feet as a proxy for improved functional endurance and mobility Baseline: 480 Goal status: INITIAL  6. Patient will use the home gym at her Apt. 2 days a week for strengthening and general fitness Baseline: 50/56 Goal status: INITIAL  PLAN:  PT FREQUENCY: 1x/week  PT DURATION: 8 weeks  PLANNED INTERVENTIONS: [id number]- PT Re-evaluation, 97750- Physical Performance Testing, 97110-Therapeutic exercises, 97530- Therapeutic activity, [id number]- Neuromuscular re-education, 97535- Self Care, [id number]- Manual therapy, [phone]- Gait training, Patient/Family education, and Balance training.  PLAN FOR NEXT SESSION: Balance core posture hinging   Ernestina Joe, PT 09/13/2024, 12:04 PM   Delon Norma, PT 09/13/24 12:04 PM Phone: [phone] Fax: [phone]

## 2024-09-18 ENCOUNTER — Telehealth: Payer: Self-pay | Admitting: Podiatry

## 2024-09-18 ENCOUNTER — Ambulatory Visit: Admitting: Physical Therapy

## 2024-09-18 ENCOUNTER — Encounter: Payer: Self-pay | Admitting: Physical Therapy

## 2024-09-18 DIAGNOSIS — M5459 Other low back pain: Secondary | ICD-10-CM | POA: Diagnosis not present

## 2024-09-18 DIAGNOSIS — R2689 Other abnormalities of gait and mobility: Secondary | ICD-10-CM | POA: Diagnosis not present

## 2024-09-18 DIAGNOSIS — M6281 Muscle weakness (generalized): Secondary | ICD-10-CM | POA: Diagnosis not present

## 2024-09-18 NOTE — Telephone Encounter (Signed)
Called to get patient scheduled.

## 2024-09-18 NOTE — Therapy (Signed)
 OUTPATIENT PHYSICAL THERAPY NOTE   Patient Name: [name] MRN: [id number] DOB:Dec 04, 1955, 68 y.o., female Today's Date: 09/18/2024  END OF SESSION:  PT End of Session - 09/18/24 1022     Visit Number 4    Number of Visits 8    Date for Recertification  10/16/24    Authorization Type U[employer] MCR    PT Start Time 1018    PT Stop Time 1100    PT Time Calculation (min) 42 min    Activity Tolerance Patient tolerated treatment well    Behavior During Therapy WFL for tasks assessed/performed             Past Medical History:  Diagnosis Date   Allergic rhinitis    Allergy    Anxiety    Arthritis    R knee, Left Knee   Asthma    Bipolar disorder (HCC)    Cataract    Complication of anesthesia    had bad anxiety after anesthesia   Constipation, chronic    Depression    Diabetes mellitus without complication (HCC)    Dysrhythmia    Palpations occ, takes Tenormin    GERD (gastroesophageal reflux disease)    Glaucoma    Heart murmur    Hx of adenomatous colonic polyps 2008   Hyperlipidemia    Hypertension    MVP (mitral valve prolapse)    Sleep apnea    wears CPAP   Urinary tract infection    frequent, see Urololgist 12/16/2010   Past Surgical History:  Procedure Laterality Date   ABDOMINAL HYSTERECTOMY  2002   no oophorectomy   BACK SURGERY  2001   L5 - S1   CATARACT EXTRACTION     COLONOSCOPY     NASAL SINUS SURGERY  01/2010   x2   TONSILLECTOMY     TOTAL KNEE ARTHROPLASTY  12/23/2011   Procedure: TOTAL KNEE ARTHROPLASTY;  Surgeon: Toribio JULIANNA Chancy, MD;  Location: [facility] OR;  Service: Orthopedics;  Laterality: Right;  DR MURPHY WANTS 120 MINUTES FOR SURGERY   TOTAL KNEE ARTHROPLASTY  08/31/2012   Procedure: TOTAL KNEE ARTHROPLASTY;  Surgeon: Toribio JULIANNA Chancy, MD;  Location: [facility] OR;  Service: Orthopedics;  Laterality: Left;   Patient Active Problem List   Diagnosis Date Noted   Iron deficiency 06/08/2024   B12 deficiency 06/08/2024   Fracture of third lumbar  vertebra (HCC) 04/21/2024   Neuropathy 05/27/2023   Anxiety 07/20/2022   Hx of adenomatous colonic polyps 07/2021   Sleep related hypoxia 12/17/2020   Cervical myelopathy (HCC) 10/16/2020   Lumbar radiculopathy 10/16/2020   Positive ANA (antinuclear antibody) 08/14/2020   Chronically dry eyes, bilateral 08/14/2020   Dry mouth 08/14/2020   Allergic rhinitis due to pollen 12/13/2018   Diabetes mellitus without complication (HCC) 02/09/2018   Statin myopathy 11/04/2017   Family history of early CAD 03/10/2017   HTN (hypertension) 02/15/2017   Herniated nucleus pulposus, L5-S1 06/22/2014   Asthma, moderate persistent 10/09/2013   Obesity (BMI 30-39.9) 10/09/2013   Fatigue 12/12/2012   GERD (gastroesophageal reflux disease) 09/02/2012   Physical exam 07/03/2011   Vitamin D  deficiency 09/15/2010   DEPRESSIVE DISORDER 07/17/2010   RHINITIS 06/14/2009   MUSCLE WEAKNESS (GENERALIZED) 01/03/2009   Hyperlipidemia 10/10/2008   Bipolar 1 disorder (HCC) 10/10/2008   ADD 10/10/2008    PCP: Mahlon Comer BRAVO MD   REFERRING PROVIDER: Mahlon Comer BRAVO, MD  REFERRING DIAG: R29.6 (ICD-10-CM) - Multiple falls R53.83 (ICD-10-CM) - Decreased stamina M62.81 (ICD-10-CM) -  Muscle weakness  Rationale for Evaluation and Treatment: Rehabilitation  THERAPY DIAG:  Other abnormalities of gait and mobility  Muscle weakness (generalized)  Other low back pain  ONSET DATE: Chronic over the past few months  SUBJECTIVE:                                                                                                                                                                                           SUBJECTIVE STATEMENT: Still a bit sore. I am a bit  stressed.    Patient presents with referral to physical therapy for general balance and weakness.  She recently noted things that would make her trip and stumble a bit.  She has had a few falls in the past 6 months.  She reports her balance has  declined over the years.  And she has become fearful.   I just feel like I've lost strength and I want to get it back  Patient reports an incident where she missed a curb as she was out, missed the step and fell.  She has no numbness or neuropathy in her feet.  She reports rare dizziness.  She is on a lot of meds and wonders if that might be an issue.  She used to be an active person and would [facility]   PERTINENT HISTORY:  Back pain, lumbar surgery,  TKA x 2 , depression, diabetes, bipolar, anxiety, hypertension   PAIN:  Are you having pain? Yes: NPRS scale: sore, 3/10   Pain location: lhips, buttocks, and back   Pain description: tight  Aggravating factors: prolonged periods of time sitting or standing  Relieving factors: Ibuprofen , moving , walking   PRECAUTIONS: Fall  RED FLAGS: None   WEIGHT BEARING RESTRICTIONS: No  FALLS:  Has patient fallen in last 6 months? Yes. 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  DIAGNOSTIC FINDINGS:  IMPRESSION: 1. Prior laminectomy at L5-S1. 2. Mild canal and foraminal stenosis bilaterally at L4-L5 secondary to disc bulging and facet arthropathy. 3. Mild foraminal  stenosis at L2-L3 and L5-S1.   PATIENT SURVEYS:  PSFS: THE PATIENT SPECIFIC FUNCTIONAL SCALE  Place score of 0-10 (0 = unable to perform activity and 10 = able to perform activity at the same level as before injury  or problem)  Activity Date: 08/21/24    Walking dog 5    2.  Standing long periods 5    3.  Bending to scoop litter or lift a pet 5    4.      Total Score 15      Total Score = Sum of activity scores/number of activities  Minimally Detectable Change: 3 points (for single activity); 2 points (for average score)  [facility] (nd). The Patient Specific Functional Scale . Retrieved from [email or web address]   COGNITION: Overall cognitive status: Within functional limits for tasks assessed     SENSATION: WFL   POSTURE: rounded shoulders and forward head  PALPATION: Did not palpate lumbar spine  LUMBAR ROM: WFL other than ext.   AROM eval  Flexion   Extension Limited 25%   Right lateral flexion   Left lateral flexion   Right rotation   Left rotation    (Blank rows = not tested)  LOWER EXTREMITY ROM:   WNL  Active  Right eval Left eval  Hip flexion    Hip extension    Hip abduction    Hip adduction    Hip internal rotation    Hip external rotation    Knee flexion    Knee extension    Ankle dorsiflexion    Ankle plantarflexion    Ankle inversion    Ankle eversion     (Blank rows = not tested)  LOWER EXTREMITY MMT:    MMT Right eval Left eval  Hip flexion 4 4  Hip extension 3+ 3+  Hip abduction 3+ 3+  Hip adduction    Hip internal rotation    Hip external rotation    Knee flexion 5 5  Knee extension 5 5  Ankle dorsiflexion 5 5  Ankle plantarflexion    Ankle inversion    Ankle eversion     (Blank rows = not tested)  LUMBAR SPECIAL TESTS:  NT   FUNCTIONAL TESTS:  5 times sit to stand: 12.6 sec  2 minute walk test: 480 feet, legs tired, HR 86, 02 96%  GAIT: Distance walked: 480 Assistive device utilized: None Level of assistance: Complete Independence Comments: LE fatigue , VSS   TREATMENT DATE:   [facility] Adult PT Treatment:                                                DATE:  09/18/24 Therapeutic Exercise: Recumbent bike 5 min level 2  Knee to chest  Hamstring  Lower trunk rotation  Sit to stand x 10, 10 lbs  Squats x 10, 10 lbs  Split squat with Airex tap with knee UE support, Rt LE weaker   Therapeutic Activity: Floor transfer progression    [facility] Adult PT Treatment:                                                DATE: 09/13/24 Therapeutic Exercise: LTR x 10  Bridge  x  15  SLR x 10  Sidelying clam GTB x 15  Seated Figure 4, 2 x 20  Therapeutic Activity: Airex step ups Split lunge 1 foot on Airex Tandem 30 sec goal  SLS 10 sec goal  Gait with head turns, nods and pivot stops  [facility] Adult PT Treatment:                                                DATE: 09/07/24 Therapeutic Exercise: Recumbent bike L1 for 5 min  Lower trunk SLR flex, abduction  Bridge  Hip abduction  Heel raises Therapeutic Activity: Simple balance on and off Airex  Tandem and SLS added to HEP   [facility] Adult PT Treatment:                                                DATE: 08/21/24 Therapeutic Activity: 2 min walk  Berg Balance  HEP issued  Self Care: See pt. education                                                                                                                                  PATIENT EDUCATION:  Education details: PT, balance, Berg score and reasons for gait instability  Person educated: Patient Education method: Programmer, Multimedia, Demonstration, Verbal cues, and Handouts Education comprehension: verbalized understanding and returned demonstration  HOME EXERCISE PROGRAM: Access Code: [email or web address] URL: [email or web address] Date: 08/21/2024 Prepared by: Delon Norma Access Code: [email or web address] URL: [email or web address] Date: 09/18/2024 Prepared by: Delon Norma  Exercises - Sit to Stand Without Arm Support  - 1 x daily - 7 x weekly - 2 sets - 10 reps - 5 hold - Supine Bridge  - 1-2 x daily - 7 x weekly - 2 sets - 10 reps - 5  hold - Supine Lower Trunk Rotation  - 1-2 x daily - 7 x weekly - 2 sets - 10 reps - 10-15 hold - Standing Tandem Balance with Counter Support  - 1 x daily - 7 x weekly - 1 sets - 5 reps - 30 hold - Single Leg Stance with Support  - 1 x daily - 7 x weekly - 1 sets - 5 reps - 30 hold - Clamshell with Resistance  - 1 x daily - 7 x weekly - 2 sets - 10 reps - 5 hold - Split Squats Forward Trunk  - 1 x daily - 7 x weekly - 2 sets - 10 reps - Standing Shoulder Row with Anchored Resistance  - 1 x daily - 7 x weekly - 2 sets - 10 reps Exercises - Sit to Stand Without Arm Support  - 1 x daily - 7 x  weekly - 2 sets - 10 reps - 5 hold - Supine Bridge  - 1-2 x daily - 7 x weekly - 2 sets - 10 reps - 5 hold - Supine Lower Trunk Rotation  - 1-2 x daily - 7 x weekly - 2 sets - 10 reps - 10-15 hold Tandem SLS  Piriforms and clam   ASSESSMENT:  CLINICAL IMPRESSION: Patient reports mild hip and lower back discomfort today which did not interfere with the session.  She was able to successfully perform a floor transfer with min to moderate cueing verbally and no physical assistance.  Took time explaining the range of motion necessary in the hips and the upper body muscle groups required to assist in this activity.  She will make 4 more weeks of appointments she is making good progress with physical therapy intervention  Patient is a 68 y.o. female who was seen today for physical therapy evaluation and treatment for balance and general weakness.  Assessment showed generalized hip and core weakness some mild lack of coordination but no red flags.  Will incorporate some general lumbar treatment as a pertains to her standing and balance.  OBJECTIVE IMPAIRMENTS: decreased activity tolerance, decreased balance, decreased coordination, decreased endurance, decreased mobility, difficulty walking, decreased strength, increased fascial restrictions, impaired flexibility, postural dysfunction, obesity, and pain.   ACTIVITY  LIMITATIONS: carrying, lifting, bending, sitting, standing, stairs, locomotion level, and caring for others  PARTICIPATION LIMITATIONS: cleaning, interpersonal relationship, shopping, community activity, and occupation  PERSONAL FACTORS: Time since onset of injury/illness/exacerbation and 3+ comorbidities: Depression anxiety back pain are also affecting patient's functional outcome.   REHAB POTENTIAL: Excellent  CLINICAL DECISION MAKING: Stable/uncomplicated  EVALUATION COMPLEXITY: Low   GOALS: Goals reviewed with patient? Yes  SHORT TERM GOALS: Target date: 09/18/2024    Patient will be independent with initial home exercise program Baseline: Goal status: ongoing   2.  Patient will be able to perform proper hip hinging and lifting in order to preserve her back while caring for her pets Baseline:  Goal status: ongoing   3.  Patient will report consistent walking 3 days a week in order to work toward improved fitness Baseline:  Goal status:MET   LONG TERM GOALS: Target date: 10/16/2024    Patient will be independent with final HEP upon discharge from PT and report consistent benefit following exercise completion.    Baseline:  Goal status: INITIAL  2.  Pt will be able to demonstrate good standing balance as evidenced by balance recovery from min to mod dynamic balance challenges.   Baseline:  Goal status: INITIAL  3.  Patient will be able to demonstrate a floor transfer for fall recovery Baseline:  Goal status: INITIAL  4.  Patient will improve Berg balance score to 54/56 Baseline: 50/56 Goal status: INITIAL  5.  Patient will improve 2-minute walk time by 50 feet as a proxy for improved functional endurance and mobility Baseline: 480 Goal status: INITIAL  6. Patient will use the home gym at her Apt. 2 days a week for strengthening and general fitness Baseline: 50/56 Goal status: INITIAL  PLAN:  PT FREQUENCY: 1x/week  PT DURATION: 8 weeks  PLANNED  INTERVENTIONS: 97164- PT Re-evaluation, 97750- Physical Performance Testing, 97110-Therapeutic exercises, 97530- Therapeutic activity, [id number]- Neuromuscular re-education, 97535- Self Care, 02859- Manual therapy, [phone]- Gait training, Patient/Family education, and Balance training.  PLAN FOR NEXT SESSION: Goal check balance core posture hinging   Raniya Golembeski, PT 09/18/2024, 10:22 AM   Delon Norma, PT 09/18/24 10:22 AM  Phone: [phone] Fax: [phone]

## 2024-09-19 ENCOUNTER — Telehealth: Payer: Self-pay | Admitting: Podiatry

## 2024-09-20 ENCOUNTER — Other Ambulatory Visit: Payer: Self-pay | Admitting: Family Medicine

## 2024-09-20 DIAGNOSIS — E119 Type 2 diabetes mellitus without complications: Secondary | ICD-10-CM

## 2024-09-20 NOTE — Telephone Encounter (Signed)
 Copied from CRM [id number]. Topic: Clinical - Medication Refill >> Sep 20, 2024  1:17 PM Paige D wrote: Medication: [id number]   0.5 MG/DOSE, 2 MG/3ML SOPN  Has the patient contacted their pharmacy? Yes (Agent: If no, request that the patient contact the pharmacy for the refill. If patient does not wish to contact the pharmacy document the reason why and proceed with request.) (Agent: If yes, when and what did the pharmacy advise?)  This is the patient's preferred pharmacy:  [facility] [id number] - [place], [place] - [address] [place] [address] Phone: [phone] Fax: [phone]   Is this the correct pharmacy for this prescription? Yes If no, delete pharmacy and type the correct one.   Has the prescription been filled recently? Yes  Is the patient out of the medication? Yes  Has the patient been seen for an appointment in the last year OR does the patient have an upcoming appointment? Yes  Can we respond through MyChart? Yes  Agent: Please be advised that Rx refills may take up to 3 business days. We ask that you follow-up with your pharmacy.

## 2024-09-27 ENCOUNTER — Encounter: Payer: Self-pay | Admitting: Family Medicine

## 2024-09-27 ENCOUNTER — Ambulatory Visit: Admitting: Family Medicine

## 2024-09-27 VITALS — BP 112/70 | HR 83 | Temp 98.0°F | Ht 69.0 in | Wt 201.5 lb

## 2024-09-27 DIAGNOSIS — M5441 Lumbago with sciatica, right side: Secondary | ICD-10-CM

## 2024-09-27 DIAGNOSIS — M5442 Lumbago with sciatica, left side: Secondary | ICD-10-CM

## 2024-09-27 LAB — POCT URINALYSIS DIPSTICK
Bilirubin, UA: NEGATIVE
Blood, UA: NEGATIVE
Glucose, UA: NEGATIVE
Ketones, UA: NEGATIVE
Leukocytes, UA: NEGATIVE
Nitrite, UA: NEGATIVE
Protein, UA: NEGATIVE
Spec Grav, UA: <=1.005 — AB (ref 1.010–1.025)
Urobilinogen, UA: 0.2 U/dL
pH, UA: 6 (ref 5.0–8.0)

## 2024-09-27 MED ORDER — NAPROXEN 500 MG PO TABS: 500.0000 mg | ORAL_TABLET | Freq: Two times a day (BID) | ORAL | 0 refills | Status: DC

## 2024-09-27 NOTE — Patient Instructions (Addendum)
 Follow up as needed or as scheduled START the Naproxen  twice daily- take w/ food CONTINUE the Tramadol  as directed by Neurosurgery ADD Tylenol  Arthritis (650mg ) in the morning, mid-afternoon, and before bed Call with any questions or concerns Stay Safe!  Stay Healthy! Happy Thanksgiving!!

## 2024-09-27 NOTE — Progress Notes (Unsigned)
   Subjective:    Patient ID: [name], female    DOB: [birth year], 68 y.o.   MRN: [id number]  HPI Back pain- pt reports 'really bad bad pain'.  Pt is seeing Neurosurg.  Has had injections.  Pt reports pain is starting on L side and radiating all the way down her leg.  Also having stabbing pain in R glute.  Has appt w/ Neurosurg (Dr Malcolm) 12/8.  Currently on Tramadol  w/ mild relief.  She does not tolerate Hydrocodone .  Not currently on NSAIDs.     Review of Systems For ROS see HPI     Objective:   Physical Exam Vitals reviewed.  Constitutional:      General: She is not in acute distress.    Appearance: Normal appearance. She is not ill-appearing.  HENT:     Head: Normocephalic and atraumatic.  Eyes:     Extraocular Movements: Extraocular movements intact.     Conjunctiva/sclera: Conjunctivae normal.  Cardiovascular:     Pulses: Normal pulses.  Musculoskeletal:     Right lower leg: No edema.     Left lower leg: No edema.  Skin:    General: Skin is warm and dry.  Neurological:     General: No focal deficit present.     Mental Status: She is alert and oriented to person, place, and time.  Psychiatric:        Mood and Affect: Mood normal.        Behavior: Behavior normal.        Thought Content: Thought content normal.           Assessment & Plan:  Bilateral back pain w/ sciatica- new to provider, ongoing for pt.  Thankfully UA WNL.  She has been seeing Neurosurgery and having injections that provide relief, but as the injections wear off, she has pain that is not controlled w/ the Tramadol  provided.  She is not currently on NSAIDs or Tylenol .  Will start prescription Naproxen  BID, add scheduled Tylenol , and save the Tramadol  for severe pain.  She has f/u scheduled next month w/ Neurosurg and she will discuss options at that time.  Pt expressed understanding and is in agreement w/ plan.

## 2024-09-28 ENCOUNTER — Ambulatory Visit: Admitting: Podiatry

## 2024-09-28 NOTE — Telephone Encounter (Signed)
 error

## 2024-09-29 ENCOUNTER — Other Ambulatory Visit: Payer: Self-pay

## 2024-09-29 ENCOUNTER — Encounter: Admitting: Physical Therapy

## 2024-09-29 DIAGNOSIS — E119 Type 2 diabetes mellitus without complications: Secondary | ICD-10-CM

## 2024-10-03 ENCOUNTER — Ambulatory Visit: Admitting: Physical Therapy

## 2024-10-03 ENCOUNTER — Encounter: Payer: Self-pay | Admitting: Physical Therapy

## 2024-10-03 DIAGNOSIS — M5459 Other low back pain: Secondary | ICD-10-CM

## 2024-10-03 DIAGNOSIS — M6281 Muscle weakness (generalized): Secondary | ICD-10-CM

## 2024-10-03 DIAGNOSIS — R2689 Other abnormalities of gait and mobility: Secondary | ICD-10-CM | POA: Diagnosis not present

## 2024-10-03 NOTE — Therapy (Signed)
 OUTPATIENT PHYSICAL THERAPY NOTE   Patient Name: [name] MRN: [id number] DOB:1956/05/16, 68 y.o., female Today's Date: 10/03/2024  END OF SESSION:  PT End of Session - 10/03/24 1022     Visit Number 5    Number of Visits 8    Date for Recertification  10/16/24    Authorization Type [employer] MCR    PT Start Time 1020    PT Stop Time 1055    PT Time Calculation (min) 35 min    Activity Tolerance Patient tolerated treatment well    Behavior During Therapy WFL for tasks assessed/performed              Past Medical History:  Diagnosis Date   Allergic rhinitis    Allergy    Anxiety    Arthritis    R knee, Left Knee   Asthma    Bipolar disorder (HCC)    Cataract    Complication of anesthesia    had bad anxiety after anesthesia   Constipation, chronic    Depression    Diabetes mellitus without complication (HCC)    Dysrhythmia    Palpations occ, takes Tenormin    GERD (gastroesophageal reflux disease)    Glaucoma    Heart murmur    Hx of adenomatous colonic polyps 2008   Hyperlipidemia    Hypertension    MVP (mitral valve prolapse)    Sleep apnea    wears CPAP   Urinary tract infection    frequent, see Urololgist 12/16/2010   Past Surgical History:  Procedure Laterality Date   ABDOMINAL HYSTERECTOMY  2002   no oophorectomy   BACK SURGERY  2001   L5 - S1   CATARACT EXTRACTION     COLONOSCOPY     NASAL SINUS SURGERY  01/2010   x2   TONSILLECTOMY     TOTAL KNEE ARTHROPLASTY  12/23/2011   Procedure: TOTAL KNEE ARTHROPLASTY;  Surgeon: Toribio JULIANNA Chancy, MD;  Location: [facility] OR;  Service: Orthopedics;  Laterality: Right;  DR MURPHY WANTS 120 MINUTES FOR SURGERY   TOTAL KNEE ARTHROPLASTY  08/31/2012   Procedure: TOTAL KNEE ARTHROPLASTY;  Surgeon: Toribio JULIANNA Chancy, MD;  Location: [facility] OR;  Service: Orthopedics;  Laterality: Left;   Patient Active Problem List   Diagnosis Date Noted   Iron deficiency 06/08/2024   B12 deficiency 06/08/2024   Fracture of third lumbar  vertebra (HCC) 04/21/2024   Neuropathy 05/27/2023   Anxiety 07/20/2022   Hx of adenomatous colonic polyps 07/2021   Sleep related hypoxia 12/17/2020   Cervical myelopathy (HCC) 10/16/2020   Lumbar radiculopathy 10/16/2020   Positive ANA (antinuclear antibody) 08/14/2020   Chronically dry eyes, bilateral 08/14/2020   Dry mouth 08/14/2020   Allergic rhinitis due to pollen 12/13/2018   Diabetes mellitus without complication (HCC) 02/09/2018   Statin myopathy 11/04/2017   Family history of early CAD 03/10/2017   HTN (hypertension) 02/15/2017   Herniated nucleus pulposus, L5-S1 06/22/2014   Asthma, moderate persistent 10/09/2013   Obesity (BMI 30-39.9) 10/09/2013   Fatigue 12/12/2012   GERD (gastroesophageal reflux disease) 09/02/2012   Physical exam 07/03/2011   Vitamin D  deficiency 09/15/2010   DEPRESSIVE DISORDER 07/17/2010   RHINITIS 06/14/2009   MUSCLE WEAKNESS (GENERALIZED) 01/03/2009   Hyperlipidemia 10/10/2008   Bipolar 1 disorder (HCC) 10/10/2008   ADD 10/10/2008    PCP: Mahlon Comer BRAVO MD   REFERRING PROVIDER: Mahlon Comer BRAVO, MD  REFERRING DIAG: R29.6 (ICD-10-CM) - Multiple falls R53.83 (ICD-10-CM) - Decreased stamina M62.81 (ICD-10-CM) -  Muscle weakness  Rationale for Evaluation and Treatment: Rehabilitation  THERAPY DIAG:  Other abnormalities of gait and mobility  Muscle weakness (generalized)  Other low back pain  ONSET DATE: Chronic over the past few months  SUBJECTIVE:                                                                                                                                                                                           SUBJECTIVE STATEMENT: Started last week-  it has been so bad. It is 10/10.  Pain is low back bilateral, glutes , buttock and LLE to her foot , sometimes it migrates to her Rt side.  Tramadol  makes her a bit sick. She sees Dr. Malcolm .   Patient presents with referral to physical therapy for general  balance and weakness.  She recently noted things that would make her trip and stumble a bit.  She has had a few falls in the past 6 months.  She reports her balance has declined over the years.  And she has become fearful.   I just feel like I've lost strength and I want to get it back  Patient reports an incident where she missed a curb as she was out, missed the step and fell.  She has no numbness or neuropathy in her feet.  She reports rare dizziness.  She is on a lot of meds and wonders if that might be an issue.  She used to be an active person and would [facility]   PERTINENT HISTORY:  Back pain, lumbar surgery,  TKA x 2 , depression, diabetes, bipolar, anxiety, hypertension   PAIN:  Are you having pain? Yes: NPRS scale:   10/10 Pain location: lhips, buttocks, and back   Pain description: tight  Aggravating factors: prolonged periods of time sitting or standing  Relieving factors: Ibuprofen , moving , walking   PRECAUTIONS: Fall  RED FLAGS: None   WEIGHT BEARING RESTRICTIONS: No  FALLS:  Has patient fallen in last 6 months? Yes. 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  DIAGNOSTIC FINDINGS:  IMPRESSION: 1. Prior laminectomy at L5-S1. 2. Mild canal and foraminal stenosis bilaterally at L4-L5 secondary to disc bulging and facet arthropathy. 3. Mild foraminal stenosis at L2-L3 and L5-S1.   PATIENT SURVEYS:   PSFS: THE PATIENT SPECIFIC FUNCTIONAL SCALE  Place score of 0-10 (0 = unable to perform activity and 10 = able to perform activity at the same level as before injury or problem)  Activity Date: 08/21/24    Walking dog 5    2.  Standing long periods 5    3.  Bending to scoop litter or lift a pet 5    4.      Total Score 15      Total Score = Sum of activity scores/number of activities  Minimally Detectable Change: 3 points (for single activity); 2 points (for average score)  [facility] (nd). The Patient Specific Functional Scale . Retrieved from [email or web address]   COGNITION: Overall cognitive status: Within functional limits for tasks assessed     SENSATION: WFL   POSTURE: rounded shoulders and forward head  PALPATION: Did not palpate lumbar spine  LUMBAR ROM: WFL other than ext.   AROM eval  Flexion   Extension Limited 25%   Right lateral flexion   Left lateral flexion   Right rotation   Left rotation    (Blank rows = not tested)  LOWER EXTREMITY ROM:   WNL  Active  Right eval Left eval  Hip flexion    Hip extension    Hip abduction    Hip adduction    Hip internal rotation    Hip external rotation    Knee flexion    Knee extension    Ankle dorsiflexion    Ankle plantarflexion    Ankle inversion    Ankle eversion     (Blank rows = not tested)  LOWER EXTREMITY MMT:    MMT Right eval Left eval  Hip flexion 4 4  Hip extension 3+ 3+  Hip abduction 3+ 3+  Hip adduction    Hip internal rotation    Hip external rotation    Knee flexion 5 5  Knee extension 5 5  Ankle dorsiflexion 5 5  Ankle plantarflexion    Ankle inversion    Ankle eversion     (Blank rows = not tested)  LUMBAR SPECIAL TESTS:  NT   FUNCTIONAL TESTS:  5 times sit to stand: 12.6 sec  2 minute walk test: 480 feet, legs tired, HR 86, 02 96%  GAIT: Distance walked: 480 Assistive device utilized:  None Level of assistance: Complete Independence Comments: LE fatigue , VSS   TREATMENT DATE:    [facility] Adult PT Treatment:                                                DATE: 10/03/24  Therapeutic Activity: Isometric ball and core contraction  LTR small Knee to chest Piriformis 2 ways  Seated HS   Self Care: Positioning and ice pack, body mechanics   Reviewed lifting body mechanics and golfers lift squat for when she has to clean up after her dog.  [facility] Adult PT Treatment:  DATE: 09/18/24 Therapeutic Exercise: Recumbent bike 5 min level 2  Knee to chest  Hamstring  Lower trunk rotation  Sit to stand x 10, 10 lbs  Squats x 10, 10 lbs  Split squat with Airex tap with knee UE support, Rt LE weaker   Therapeutic Activity: Floor transfer progression    [facility] Adult PT Treatment:                                                DATE: 09/13/24 Therapeutic Exercise: LTR x 10  Bridge  x 15  SLR x 10  Sidelying clam GTB x 15  Seated Figure 4, 2 x 20  Therapeutic Activity: Airex step ups Split lunge 1 foot on Airex Tandem 30 sec goal  SLS 10 sec goal  Gait with head turns, nods and pivot stops  [facility] Adult PT Treatment:                                                DATE: 09/07/24 Therapeutic Exercise: Recumbent bike L1 for 5 min  Lower trunk SLR flex, abduction  Bridge  Hip abduction  Heel raises Therapeutic Activity: Simple balance on and off Airex  Tandem and SLS added to HEP   [facility] Adult PT Treatment:                                                DATE: 08/21/24 Therapeutic Activity: 2 min walk  Berg Balance  HEP issued  Self Care: See pt. education                                                                                                                                  PATIENT EDUCATION:  Education details: Patient given extensive information on reducing back pain with positioning body mechanics posture and  reducing nerve tension.   Person educated: Patient Education method: Explanation, Demonstration, Verbal cues, and Handouts Education comprehension: verbalized understanding and returned demonstration  HOME EXERCISE PROGRAM: Access Code: [email or web address] URL: [email or web address] Date: 08/21/2024 Prepared by: Delon Norma Access Code: [email or web address] URL: [email or web address] Date: 09/18/2024 Prepared by: Delon Norma  Exercises - Sit to Stand Without Arm Support  - 1 x daily - 7 x weekly - 2 sets - 10 reps - 5 hold - Supine Bridge  - 1-2 x daily - 7 x weekly - 2 sets - 10 reps - 5 hold - Supine Lower Trunk Rotation  - 1-2 x daily - 7 x weekly - 2 sets - 10 reps - 10-15 hold -  Standing Tandem Balance with Counter Support  - 1 x daily - 7 x weekly - 1 sets - 5 reps - 30 hold - Single Leg Stance with Support  - 1 x daily - 7 x weekly - 1 sets - 5 reps - 30 hold - Clamshell with Resistance  - 1 x daily - 7 x weekly - 2 sets - 10 reps - 5 hold - Split Squats Forward Trunk  - 1 x daily - 7 x weekly - 2 sets - 10 reps - Standing Shoulder Row with Anchored Resistance  - 1 x daily - 7 x weekly - 2 sets - 10 reps Exercises - Sit to Stand Without Arm Support  - 1 x daily - 7 x weekly - 2 sets - 10 reps - 5 hold - Supine Bridge  - 1-2 x daily - 7 x weekly - 2 sets - 10 reps - 5 hold - Supine Lower Trunk Rotation  - 1-2 x daily - 7 x weekly - 2 sets - 10 reps - 10-15 hold Tandem SLS  Piriforms and clam   ASSESSMENT:  CLINICAL IMPRESSION: Patient unfortunately has had increasing pain over the past week or so.  She has seen her primary care physician and will be following up with Dr. Malcolm on December 9.  Today I showed her basic stretches that she could do to reduce her back and leg pain.  We deferred standing balance until her pain was better controlled.  She was educated her on body mechanics simple transfers bed to seated.  She will be on hold until she sees the doctor.       OBJECTIVE IMPAIRMENTS: decreased activity tolerance, decreased balance, decreased coordination, decreased endurance, decreased mobility, difficulty walking, decreased strength, increased fascial restrictions, impaired flexibility, postural dysfunction, obesity, and pain.   ACTIVITY LIMITATIONS: carrying, lifting, bending, sitting, standing, stairs, locomotion level, and caring for others  PARTICIPATION LIMITATIONS: cleaning, interpersonal relationship, shopping, community activity, and occupation  PERSONAL FACTORS: Time since onset of injury/illness/exacerbation and 3+ comorbidities: Depression anxiety back pain are also affecting patient's functional outcome.   REHAB POTENTIAL: Excellent  CLINICAL DECISION MAKING: Stable/uncomplicated  EVALUATION COMPLEXITY: Low   GOALS: Goals reviewed with patient? Yes  SHORT TERM GOALS: Target date: 09/18/2024    Patient will be independent with initial home exercise program Baseline: Goal status: ongoing   2.  Patient will be able to perform proper hip hinging and lifting in order to preserve her back while caring for her pets Baseline:  Goal status: ongoing   3.  Patient will report consistent walking 3 days a week in order to work toward improved fitness Baseline:  Goal status:MET   LONG TERM GOALS: Target date: 10/16/2024    Patient will be independent with final HEP upon discharge from PT and report consistent benefit following exercise completion.    Baseline:  Goal status: INITIAL  2.  Pt will be able to demonstrate good standing balance as evidenced by balance recovery from min to mod dynamic balance challenges.   Baseline:  Goal status: INITIAL  3.  Patient will be able to demonstrate a floor transfer for fall recovery Baseline:  Goal status: INITIAL  4.  Patient will improve Berg balance score to 54/56 Baseline: 50/56 Goal status: INITIAL  5.  Patient will improve 2-minute walk time by 50 feet as a proxy for  improved functional endurance and mobility Baseline: 480 Goal status: INITIAL  6. Patient will use the home gym at her  [address] 2 days a week for strengthening and general fitness Baseline: 50/56 Goal status: INITIAL  PLAN:  PT FREQUENCY: 1x/week  PT DURATION: 8 weeks  PLANNED INTERVENTIONS: 97164- PT Re-evaluation, 97750- Physical Performance Testing, 97110-Therapeutic exercises, 97530- Therapeutic activity, [id number]- Neuromuscular re-education, 97535- Self Care, [id number]- Manual therapy, [id number]- Gait training, Patient/Family education, and Balance training.  PLAN FOR NEXT SESSION: Goal check balance core posture hinging   Lanayah Gartley, PT 10/03/2024, 11:05 AM   Delon Norma, PT 10/03/24 11:05 AM Phone: [phone] Fax: [phone]

## 2024-10-13 ENCOUNTER — Encounter: Admitting: Physical Therapy

## 2024-10-17 ENCOUNTER — Other Ambulatory Visit: Payer: Self-pay | Admitting: Student

## 2024-10-17 DIAGNOSIS — M48061 Spinal stenosis, lumbar region without neurogenic claudication: Secondary | ICD-10-CM

## 2024-10-18 ENCOUNTER — Encounter: Admitting: Physical Therapy

## 2024-10-19 ENCOUNTER — Telehealth: Payer: Self-pay

## 2024-10-19 NOTE — Telephone Encounter (Signed)
 Telephone call placed to schedule PV and Colonoscopy w/ Dr. Avram per recall assessment.  VM obtained and message left for patient to return call and ask for scheduling.

## 2024-10-20 ENCOUNTER — Other Ambulatory Visit: Payer: Self-pay

## 2024-10-20 ENCOUNTER — Telehealth: Payer: Self-pay | Admitting: Family Medicine

## 2024-10-20 DIAGNOSIS — E119 Type 2 diabetes mellitus without complications: Secondary | ICD-10-CM

## 2024-10-20 MED ORDER — OZEMPIC (0.25 OR 0.5 MG/DOSE) 2 MG/3ML ~~LOC~~ SOPN: 0.5000 mg | PEN_INJECTOR | SUBCUTANEOUS | 1 refills | Status: AC

## 2024-10-20 NOTE — Telephone Encounter (Signed)
 I'm not sure what is being asked- does pt just need a refill?  Does she need a dose change?

## 2024-10-20 NOTE — Telephone Encounter (Signed)
 Prescription for 0.5mg  weekly dose sent to pharmacy

## 2024-10-20 NOTE — Telephone Encounter (Signed)
 Copied from CRM #[id number]. Topic: Clinical - Medication Refill >> Oct 20, 2024  2:10 PM Alfonso HERO wrote: Medication: OZEMPIC , 0.25 OR 0.5 MG/DOSE, 2 MG/3ML SOPN  Has the patient contacted their pharmacy? Yes (Agent: If no, request that the patient contact the pharmacy for the refill. If patient does not wish to contact the pharmacy document the reason why and proceed with request.) (Agent: If yes, when and what did the pharmacy advise?)  This is the patient's preferred pharmacy:  [id number] - [place], [place] - [address] [place] [address] Phone: [phone] Fax: [phone]   Is this the correct pharmacy for this prescription? Yes If no, delete pharmacy and type the correct one.   Has the prescription been filled recently? Yes  Is the patient out of the medication? No  Has the patient been seen for an appointment in the last year OR does the patient have an upcoming appointment? Yes  Can we respond through MyChart? Yes  Agent: Please be advised that Rx refills may take up to 3 business days. We ask that you follow-up with your pharmacy.

## 2024-10-27 ENCOUNTER — Ambulatory Visit

## 2024-10-27 ENCOUNTER — Other Ambulatory Visit: Payer: Self-pay

## 2024-10-27 DIAGNOSIS — R2689 Other abnormalities of gait and mobility: Secondary | ICD-10-CM | POA: Diagnosis present

## 2024-10-27 DIAGNOSIS — M6281 Muscle weakness (generalized): Secondary | ICD-10-CM | POA: Insufficient documentation

## 2024-10-27 DIAGNOSIS — M5459 Other low back pain: Secondary | ICD-10-CM | POA: Diagnosis present

## 2024-10-27 NOTE — Therapy (Addendum)
 " Pt is being discharged as she has not returned since her last visit.  Marijo Berber PT, DPT  11/29/2024 4:12 PM    OUTPATIENT PHYSICAL THERAPY RE-CERTIFICATION   Progress Note Reporting Period 08/21/2024 to 10/27/2024  See note below for Objective Data and Assessment of Progress/Goals.      Patient Name: [name] MRN: [id number] DOB:[birth year], 68 y.o., female Today's Date: 10/27/2024  END OF SESSION:  PT End of Session - 10/27/24 1208    Visit Number 6   Number of Visits 14   Date for Recertification  12/08/2024    Authorization Type UHC MCR    PT Start Time 1208   PT Stop Time 1246    PT Time Calculation (min) 38 min    Activity Tolerance Patient tolerated treatment well    Behavior During Therapy WFL for tasks assessed/performed             Past Medical History:  Diagnosis Date   Allergic rhinitis    Allergy    Anxiety    Arthritis    R knee, Left Knee   Asthma    Bipolar disorder (HCC)    Cataract    Complication of anesthesia    had bad anxiety after anesthesia   Constipation, chronic    Depression    Diabetes mellitus without complication (HCC)    Dysrhythmia    Palpations occ, takes Tenormin    GERD (gastroesophageal reflux disease)    Glaucoma    Heart murmur    Hx of adenomatous colonic polyps 2008   Hyperlipidemia    Hypertension    MVP (mitral valve prolapse)    Sleep apnea    wears CPAP   Urinary tract infection    frequent, see Urololgist 12/16/2010   Past Surgical History:  Procedure Laterality Date   ABDOMINAL HYSTERECTOMY  2002   no oophorectomy   BACK SURGERY  2001   L5 - S1   CATARACT EXTRACTION     COLONOSCOPY     NASAL SINUS SURGERY  01/2010   x2   TONSILLECTOMY     TOTAL KNEE ARTHROPLASTY  12/23/2011   Procedure: TOTAL KNEE ARTHROPLASTY;  Surgeon: Toribio JULIANNA Chancy, MD;  Location: [facility] OR;  Service: Orthopedics;  Laterality: Right;  DR MURPHY WANTS 120 MINUTES FOR SURGERY   TOTAL KNEE ARTHROPLASTY  08/31/2012    Procedure: TOTAL KNEE ARTHROPLASTY;  Surgeon: Toribio JULIANNA Chancy, MD;  Location: [facility] OR;  Service: Orthopedics;  Laterality: Left;   Patient Active Problem List   Diagnosis Date Noted   Iron deficiency 06/08/2024   B12 deficiency 06/08/2024   Fracture of third lumbar vertebra (HCC) 04/21/2024   Neuropathy 05/27/2023   Anxiety 07/20/2022   Hx of adenomatous colonic polyps 07/2021   Sleep related hypoxia 12/17/2020   Cervical myelopathy (HCC) 10/16/2020   Lumbar radiculopathy 10/16/2020   Positive ANA (antinuclear antibody) 08/14/2020   Chronically dry eyes, bilateral 08/14/2020   Dry mouth 08/14/2020   Allergic rhinitis due to pollen 12/13/2018   Diabetes mellitus without complication (HCC) 02/09/2018   Statin myopathy 11/04/2017   Family history of early CAD 03/10/2017   HTN (hypertension) 02/15/2017   Herniated nucleus pulposus, L5-S1 06/22/2014   Asthma, moderate persistent 10/09/2013   Obesity (BMI 30-39.9) 10/09/2013   Fatigue 12/12/2012   GERD (gastroesophageal reflux disease) 09/02/2012   Physical exam 07/03/2011   Vitamin D  deficiency 09/15/2010   DEPRESSIVE DISORDER 07/17/2010   RHINITIS 06/14/2009   MUSCLE WEAKNESS (GENERALIZED) 01/03/2009  Hyperlipidemia 10/10/2008   Bipolar 1 disorder (HCC) 10/10/2008   ADD 10/10/2008    PCP: Mahlon Comer BRAVO MD   REFERRING PROVIDER: Mahlon Comer BRAVO, MD  REFERRING DIAG: R29.6 (ICD-10-CM) - Multiple falls R53.83 (ICD-10-CM) - Decreased stamina M62.81 (ICD-10-CM) - Muscle weakness  Rationale for Evaluation and Treatment: Rehabilitation  THERAPY DIAG:  Other low back pain  Muscle weakness (generalized)  Other abnormalities of gait and mobility  ONSET DATE: Chronic over the past few months  SUBJECTIVE:                                                                                                                                                                                           SUBJECTIVE STATEMENT: Re-Cert:  pt reports she wants to switch to the [facility] location and to focus on her low back pain. She notes no additional falls since her start in PT. She also notes no change in her low back pain or strength.   Patient presents with referral to physical therapy for general balance and weakness.  She recently noted things that would make her trip and stumble a bit.  She has had a few falls in the past 6 months.  She reports her balance has declined over the years.  And she has become fearful.   I just feel like I've lost strength and I want to get it back  Patient reports an incident where she missed a curb as she was out, missed the step and fell.  She has no numbness or neuropathy in her feet.  She reports rare dizziness.  She is on a lot of meds and wonders if that might be an issue.  She used to be an active person and [facility]   PERTINENT HISTORY:  Back pain, lumbar surgery,  TKA x 2 , depression, diabetes, bipolar, anxiety, hypertension   PAIN:  Are you having pain? Yes: NPRS scale:   10/10 Pain location: lhips, buttocks, and back   Pain description: tight  Aggravating factors: prolonged periods of time sitting or standing  Relieving factors: Ibuprofen , moving , walking   PRECAUTIONS: Fall  RED FLAGS: None   WEIGHT BEARING RESTRICTIONS: No  FALLS:  Has patient fallen in last 6 months? Yes. 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  DIAGNOSTIC FINDINGS:  IMPRESSION: 1. Prior laminectomy at L5-S1. 2. Mild canal and foraminal stenosis bilaterally at L4-L5 secondary to disc bulging and facet arthropathy. 3. Mild foraminal stenosis at L2-L3 and L5-S1.   PATIENT SURVEYS:  PSFS: THE PATIENT SPECIFIC FUNCTIONAL SCALE  Place score of 0-10 (0 = unable to perform activity and 10 = able to perform activity at the same level as before injury or problem)  Activity Date: 08/21/24    Walking dog 5    2.  Standing long periods 5    3.  Bending to scoop litter or lift a pet 5    4.      Total Score 15      Total Score = Sum of activity scores/number of activities  Minimally Detectable Change: 3 points (for single activity); 2 points (for average score)  [employer] [facility] (nd). The Patient Specific Functional Scale . Retrieved from [email or web address]   COGNITION: Overall cognitive status: Within functional limits for tasks assessed     SENSATION: WFL   POSTURE: rounded shoulders and forward head  PALPATION: Did not palpate lumbar spine  LUMBAR ROM: WFL other than ext.   AROM eval 10/27/24  Flexion  75% limited  Extension Limited 25%  25% limited  Right lateral flexion  25% limited  Left lateral flexion  25% limited  Right rotation  25% limited  Left rotation  25% limited   (Blank rows = not tested)  LOWER EXTREMITY ROM:   WNL  Active  Right eval Left eval  Hip flexion    Hip extension    Hip abduction    Hip adduction    Hip internal rotation    Hip external rotation    Knee flexion    Knee extension    Ankle dorsiflexion    Ankle plantarflexion    Ankle inversion    Ankle eversion     (Blank rows = not tested)  LOWER EXTREMITY MMT:    MMT Right eval Left eval R  10/27/24 L 10/27/24  Hip flexion 4 4 4 4   Hip extension 3+ 3+ 3+ 3+  Hip abduction 3+ 3+ 3+ 3+  Hip adduction      Hip internal rotation       Hip external rotation      Knee flexion 5 5    Knee extension 5 5    Ankle dorsiflexion 5 5    Ankle plantarflexion      Ankle inversion      Ankle eversion       (Blank rows = not tested)  LUMBAR SPECIAL TESTS:  NT   FUNCTIONAL TESTS:  5 times sit to stand: 12.6 sec  2 minute walk test: 480 feet, legs tired, HR 86, 02 96%  GAIT: Distance walked: 480 Assistive device utilized: None Level of assistance: Complete Independence Comments: LE fatigue , VSS   TREATMENT DATE:  [facility] Adult PT Treatment:                                                DATE: 10/27/24  Therapeutic Activity: Tests and measures  [facility] Adult PT Treatment:                                                DATE: 10/03/24  Therapeutic Activity: Isometric ball and core contraction  LTR small Knee to chest Piriformis 2 ways  Seated HS   Self Care: Positioning and ice pack, body mechanics   Reviewed lifting body mechanics and golfers lift squat for when she has to clean up after her dog.  [facility] Adult PT Treatment:                                                DATE: 09/18/24 Therapeutic Exercise: Recumbent bike 5 min level 2  Knee to chest  Hamstring  Lower trunk rotation  Sit to stand x 10, 10 lbs  Squats x 10, 10 lbs  Split squat with Airex tap with knee UE support, Rt LE weaker   Therapeutic Activity: Floor transfer progression    [facility] Adult PT Treatment:                                                DATE: 09/13/24 Therapeutic Exercise: LTR x 10  Bridge  x 15  SLR x 10  Sidelying clam GTB x 15  Seated Figure 4, 2 x 20  Therapeutic Activity: Airex step ups Split lunge 1 foot on Airex Tandem 30 sec goal  SLS 10 sec goal  Gait with head turns, nods and pivot stops  [facility] Adult PT Treatment:                                                DATE: 09/07/24 Therapeutic Exercise: Recumbent bike L1 for 5 min  Lower trunk SLR flex, abduction  Bridge  Hip abduction  Heel raises Therapeutic  Activity: Simple balance on and off Airex  Tandem and SLS added to HEP   [facility] Adult PT Treatment:                                                DATE: 08/21/24 Therapeutic Activity: 2 min walk  Berg Balance  HEP issued  Self Care: See pt. education  PATIENT EDUCATION:  Education details: Patient given extensive information on reducing back pain with positioning body mechanics posture and reducing nerve tension.   Person educated: Patient Education method: Explanation, Demonstration, Verbal cues, and Handouts Education comprehension: verbalized understanding and returned demonstration  HOME EXERCISE PROGRAM: Access Code: [email or web address] URL: [email or web address] Date: 08/21/2024 Prepared by: Delon Norma Access Code: [email or web address] URL: [email or web address] Date: 09/18/2024 Prepared by: Delon Norma  Exercises - Sit to Stand Without Arm Support  - 1 x daily - 7 x weekly - 2 sets - 10 reps - 5 hold - Supine Bridge  - 1-2 x daily - 7 x weekly - 2 sets - 10 reps - 5 hold - Supine Lower Trunk Rotation  - 1-2 x daily - 7 x weekly - 2 sets - 10 reps - 10-15 hold - Standing Tandem Balance with Counter Support  - 1 x daily - 7 x weekly - 1 sets - 5 reps - 30 hold - Single Leg Stance with Support  - 1 x daily - 7 x weekly - 1 sets - 5 reps - 30 hold - Clamshell with Resistance  - 1 x daily - 7 x weekly - 2 sets - 10 reps - 5 hold - Split Squats Forward Trunk  - 1 x daily - 7 x weekly - 2 sets - 10 reps - Standing Shoulder Row with Anchored Resistance  - 1 x daily - 7 x weekly - 2 sets - 10 reps Exercises - Sit to Stand Without Arm Support  - 1 x daily - 7 x weekly - 2 sets - 10 reps - 5 hold - Supine Bridge  - 1-2 x daily - 7 x weekly - 2 sets - 10 reps - 5 hold - Supine Lower Trunk Rotation  - 1-2 x daily - 7 x weekly - 2 sets - 10 reps -  10-15 hold Tandem SLS  Piriforms and clam   ASSESSMENT:  CLINICAL IMPRESSION: Patient and therapist discussed current findings with comparison to findings during eval. Pt has seen no progress in her objective measures since the start of PT. She continues to have weakness, limited ROM, and pain which decreases her functional potential. The pt will benefit from skilled physical therapy to decrease pain and increase function.      OBJECTIVE IMPAIRMENTS: decreased activity tolerance, decreased balance, decreased coordination, decreased endurance, decreased mobility, difficulty walking, decreased strength, increased fascial restrictions, impaired flexibility, postural dysfunction, obesity, and pain.   ACTIVITY LIMITATIONS: carrying, lifting, bending, sitting, standing, stairs, locomotion level, and caring for others  PARTICIPATION LIMITATIONS: cleaning, interpersonal relationship, shopping, community activity, and occupation  PERSONAL FACTORS: Time since onset of injury/illness/exacerbation and 3+ comorbidities: Depression anxiety back pain are also affecting patient's functional outcome.   REHAB POTENTIAL: Excellent  CLINICAL DECISION MAKING: Stable/uncomplicated  EVALUATION COMPLEXITY: Low   GOALS: Goals reviewed with patient? Yes  SHORT TERM GOALS: Target date: 09/18/2024    Patient will be independent with initial home exercise program Baseline: Goal status: ongoing   2.  Patient will be able to perform proper hip hinging and lifting in order to preserve her back while caring for her pets Baseline:  Goal status: ongoing   3.  Patient will report consistent walking 3 days a week in order to work toward improved fitness Baseline:  Goal status:MET   LONG TERM GOALS: Target date: 12/08/2024    Patient will be independent with final HEP upon discharge from PT and report consistent benefit following exercise completion.  Baseline:  Goal status: ongoing  2.  Pt will be  able to demonstrate good standing balance as evidenced by balance recovery from min to mod dynamic balance challenges.   Baseline:  Goal status: ongoing  3.  Patient will be able to demonstrate a floor transfer for fall recovery Baseline:  Goal status: ongoing  4.  Patient will improve Berg balance score to 54/56 Baseline: 50/56 Goal status: ongoing  5.  Patient will improve 2-minute walk time by 50 feet as a proxy for improved functional endurance and mobility Baseline: 480 Goal status: ongoing  6. Patient will use the home gym at her Apt. 2 days a week for strengthening and general fitness Baseline: 50/56 Goal status: ongoing  PLAN:  PT FREQUENCY: 1x/week  PT DURATION: 6 weeks  PLANNED INTERVENTIONS: 97164- PT Re-evaluation, 97750- Physical Performance Testing, 97110-Therapeutic exercises, 97530- Therapeutic activity, [id number]- Neuromuscular re-education, 97535- Self Care, 02859- Manual therapy, [id number]- Gait training, Patient/Family education, and Balance training.  For all possible CPT codes, reference the Planned Interventions line above.     Check all conditions that are expected to impact treatment: {Conditions expected to impact treatment:Morbid obesity and Social determinants of health   If treatment provided at initial evaluation, no treatment charged due to lack of authorization.       PLAN FOR NEXT SESSION: Goal check balance core posture hinging   Marijo DELENA Berber, PT 11/05/2024, 8:11 PM   "

## 2024-11-03 ENCOUNTER — Telehealth: Payer: Self-pay

## 2024-11-03 NOTE — Discharge Instructions (Signed)

## 2024-11-03 NOTE — Telephone Encounter (Signed)
 Called to screen for Myelogram on 12/29. Pt has allergy/Intolerance to Xanax. Discussed Valium  beinf the same drug class. Pt will premedicate with her home medication.

## 2024-11-06 ENCOUNTER — Ambulatory Visit
Admission: RE | Admit: 2024-11-06 | Discharge: 2024-11-06 | Disposition: A | Source: Ambulatory Visit | Attending: Student

## 2024-11-06 DIAGNOSIS — M48061 Spinal stenosis, lumbar region without neurogenic claudication: Secondary | ICD-10-CM

## 2024-11-06 MED ORDER — ONDANSETRON HCL 4 MG/2ML IJ SOLN: 4.0000 mg | Freq: Once | INTRAMUSCULAR | Status: DC | PRN

## 2024-11-06 MED ORDER — IOPAMIDOL (ISOVUE-M 200) INJECTION 41%
20.0000 mL | Freq: Once | INTRAMUSCULAR | Status: AC
  Administered 2024-11-06: 20 mL via INTRATHECAL

## 2024-11-06 MED ORDER — MEPERIDINE HCL 50 MG/ML IJ SOLN
50.0000 mg | Freq: Once | INTRAMUSCULAR | Status: AC | PRN
  Administered 2024-11-06: 50 mg via INTRAMUSCULAR

## 2024-11-06 NOTE — Progress Notes (Signed)
 Pt states she took her Clonazepam  at home at 0400. Pt states she has 10/10 pain in back .Wears CPAP at night. O2 sats 94% on room air. Spoke with Dr Shoshana regarding pt. Dr Shoshana ok to premedicate pt with 50 mg Demerol  IM for 10/10 pain per procedure. Pt placed on oxygen 4LPM/[place] prophylactic due to history and pre med.

## 2024-11-10 ENCOUNTER — Other Ambulatory Visit: Payer: Self-pay

## 2024-11-10 DIAGNOSIS — B0229 Other postherpetic nervous system involvement: Secondary | ICD-10-CM

## 2024-11-10 MED ORDER — FEXOFENADINE HCL 180 MG PO TABS: 180.0000 mg | ORAL_TABLET | Freq: Every day | ORAL | 3 refills | Status: AC

## 2024-11-14 ENCOUNTER — Ambulatory Visit

## 2024-11-15 ENCOUNTER — Other Ambulatory Visit: Payer: Self-pay | Admitting: Nurse Practitioner

## 2024-11-15 DIAGNOSIS — Z8249 Family history of ischemic heart disease and other diseases of the circulatory system: Secondary | ICD-10-CM

## 2024-11-15 DIAGNOSIS — E785 Hyperlipidemia, unspecified: Secondary | ICD-10-CM

## 2024-11-17 ENCOUNTER — Telehealth (HOSPITAL_BASED_OUTPATIENT_CLINIC_OR_DEPARTMENT_OTHER): Payer: Self-pay | Admitting: *Deleted

## 2024-11-17 NOTE — Telephone Encounter (Signed)
Left message to call back and schedule tele pre op appt

## 2024-11-17 NOTE — Telephone Encounter (Signed)
"  °  °  Pre-operative Risk Assessment    Patient Name: [name]  DOB: 01/19/1956 MRN: [id number]   Date of last office visit: 07/26/24 DR. GEORGANNA ARCHER Date of next office visit: NONE   Request for Surgical Clearance    Procedure:  L4-5 LUMBAR FUSION  Date of Surgery:  Clearance TBD                                Surgeon:  DR. VICTORY GUNNELS Surgeon's Group or Practice Name: [facility] Phone number:  [phone] EXT [address] Fax number:  [phone]   Type of Clearance Requested:   - Medical    Type of Anesthesia:  General    Additional requests/questions:    Bonney Niels Jest   11/17/2024, 2:42 PM   "

## 2024-11-17 NOTE — Telephone Encounter (Signed)
" ° °  Name: [name]  DOB: 04-Apr-1956  MRN: [id number]  Primary Cardiologist: Georganna Archer, MD   Preoperative team, please contact this patient and set up a phone call appointment for further preoperative risk assessment. Please obtain consent and complete medication review. Thank you for your help.  I also confirmed the patient resides in the state of [place] . As per [facility] Medical Board telemedicine laws, the patient must reside in the state in which the provider is licensed.   Karver Fadden, [place] 11/17/2024, 2:52 PM [facility]    "

## 2024-11-20 NOTE — Telephone Encounter (Signed)
 Patient has been scheduled for pre-op clearance on 11/22/24 with Melanie Bane, NP. Med list and consent done.      Patient Consent for Virtual Visit        [name] has provided verbal consent on 11/20/2024 for a virtual visit (video or telephone).   CONSENT FOR VIRTUAL VISIT FOR:  [name]  By participating in this virtual visit I agree to the following:  I hereby voluntarily request, consent and authorize [facility] and its employed or contracted physicians, physician assistants, nurse practitioners or other licensed health care professionals (the Practitioner), to provide me with telemedicine health care services (the Services) as deemed necessary by the treating Practitioner. I acknowledge and consent to receive the Services by the Practitioner via telemedicine. I understand that the telemedicine visit will involve communicating with the Practitioner through live audiovisual communication technology and the disclosure of certain medical information by electronic transmission. I acknowledge that I have been given the opportunity to request an in-person assessment or other available alternative prior to the telemedicine visit and am voluntarily participating in the telemedicine visit.  I understand that I have the right to withhold or withdraw my consent to the use of telemedicine in the course of my care at any time, without affecting my right to future care or treatment, and that the Practitioner or I may terminate the telemedicine visit at any time. I understand that I have the right to inspect all information obtained and/or recorded in the course of the telemedicine visit and may receive copies of available information for a reasonable fee.  I understand that some of the potential risks of receiving the Services via telemedicine include:  Delay or interruption in medical evaluation due to technological equipment failure or disruption; Information transmitted may  not be sufficient (e.g. poor resolution of images) to allow for appropriate medical decision making by the Practitioner; and/or  In rare instances, security protocols could fail, causing a breach of personal health information.  Furthermore, I acknowledge that it is my responsibility to provide information about my medical history, conditions and care that is complete and accurate to the best of my ability. I acknowledge that Practitioner's advice, recommendations, and/or decision may be based on factors not within their control, such as incomplete or inaccurate data provided by me or distortions of diagnostic images or specimens that may result from electronic transmissions. I understand that the practice of medicine is not an exact science and that Practitioner makes no warranties or guarantees regarding treatment outcomes. I acknowledge that a copy of this consent can be made available to me via my patient portal [facility] MyChart), or I can request a printed copy by calling the office of [facility].    I understand that my insurance will be billed for this visit.   I have read or had this consent read to me. I understand the contents of this consent, which adequately explains the benefits and risks of the Services being provided via telemedicine.  I have been provided ample opportunity to ask questions regarding this consent and the Services and have had my questions answered to my satisfaction. I give my informed consent for the services to be provided through the use of telemedicine in my medical care

## 2024-11-21 ENCOUNTER — Ambulatory Visit

## 2024-11-22 ENCOUNTER — Ambulatory Visit

## 2024-11-22 DIAGNOSIS — Z01810 Encounter for preprocedural cardiovascular examination: Secondary | ICD-10-CM

## 2024-11-22 NOTE — Progress Notes (Signed)
 "   Virtual Visit via Telephone Note   Because of [name] co-morbid illnesses, she is at least at moderate risk for complications without adequate follow up.  This format is felt to be most appropriate for this patient at this time.  Due to technical limitations with video connection (technology), today's appointment will be conducted as an audio only telehealth visit, and [name] verbally agreed to proceed in this manner.   All issues noted in this document were discussed and addressed.  No physical exam could be performed with this format.  Evaluation Performed:  Preoperative cardiovascular risk assessment _____________   Date:  11/22/2024   Patient ID:  [name], DOB 1956-02-08, MRN [id number] Patient Location:  Home Provider location:   Office  Primary Care Provider:  Mahlon Comer BRAVO, MD Primary Cardiologist:  Georganna Archer, MD  Chief Complaint / Patient Profile   69 y.o. y/o female with a h/o OSA on CPAP, HTN, HLD, mild MR, mild AI, type 2 diabetes, fixed anterior defect on NM stress test 06/23/24 felt to be artifact, normal LVEF, no regional wall motion abnormalities on echo 06/2024 who is pending L4-5 lumbar fusion date TBD and presents today for telephonic preoperative cardiovascular risk assessment.  History of Present Illness    [name] is a 69 y.o. female who presents via audio/video conferencing for a telehealth visit today.  Pt was last seen in cardiology clinic on 07/26/24 by Dr. Archer.  At that time [name] was doing well.  The patient is now pending procedure as outlined above. Since her last visit, she  denies chest pain, shortness of breath, lower extremity edema, fatigue, palpitations, melena, hematuria, hemoptysis, diaphoresis, weakness, presyncope, syncope, orthopnea, and PND. She is active with taking her dog out and house work and is able to achieve > 4 METS activity without concerning cardiac symptoms.   Past Medical History    Past  Medical History:  Diagnosis Date   Allergic rhinitis    Allergy    Anxiety    Arthritis    R knee, Left Knee   Asthma    Bipolar disorder (HCC)    Cataract    Complication of anesthesia    had bad anxiety after anesthesia   Constipation, chronic    Depression    Diabetes mellitus without complication (HCC)    Dysrhythmia    Palpations occ, takes Tenormin    GERD (gastroesophageal reflux disease)    Glaucoma    Heart murmur    Hx of adenomatous colonic polyps 2008   Hyperlipidemia    Hypertension    MVP (mitral valve prolapse)    Sleep apnea    wears CPAP   Urinary tract infection    frequent, see Urololgist 12/16/2010   Past Surgical History:  Procedure Laterality Date   ABDOMINAL HYSTERECTOMY  2002   no oophorectomy   BACK SURGERY  2001   L5 - S1   CATARACT EXTRACTION     COLONOSCOPY     NASAL SINUS SURGERY  01/2010   x2   TONSILLECTOMY     TOTAL KNEE ARTHROPLASTY  12/23/2011   Procedure: TOTAL KNEE ARTHROPLASTY;  Surgeon: Toribio JULIANNA Chancy, MD;  Location: [facility] OR;  Service: Orthopedics;  Laterality: Right;  DR MURPHY WANTS 120 MINUTES FOR SURGERY   TOTAL KNEE ARTHROPLASTY  08/31/2012   Procedure: TOTAL KNEE ARTHROPLASTY;  Surgeon: Toribio JULIANNA Chancy, MD;  Location: [facility] OR;  Service: Orthopedics;  Laterality: Left;  Allergies  Allergies[1]  Home Medications    Prior to Admission medications  Medication Sig Start Date End Date Taking? Authorizing Provider  azelastine  (ASTELIN ) 0.1 % nasal spray PLACE 2 SPRAYS INTO EACH NOSTRIL TWO TIMES A DAY AS DIRECTED 08/22/21   Mahlon Comer BRAVO, MD  blood glucose meter kit and supplies KIT Dispense based on patient and insurance preference. Use up to four times daily as directed. 04/15/22   Tabori, Katherine E, MD  Cholecalciferol  (VITAMIN D3) 50 MCG (2000 UT) CAPS Take 2,000 Units by mouth daily.    [provider]  clonazePAM  (KLONOPIN ) 2 MG tablet Take 2 mg by mouth 2 (two) times daily as needed. 06/05/24   [provider]  Cyanocobalamin  (VITAMIN B-12 PO) Take by mouth.    [provider]  doxycycline  (VIBRA -TABS) 100 MG tablet Take 1 tablet (100 mg total) by mouth 2 (two) times daily. 08/31/24   Tabori, Katherine E, MD  fenofibrate  (TRICOR ) 145 MG tablet Take 1 tablet (145 mg total) by mouth daily. 08/31/24   Tabori, Katherine E, MD  fexofenadine  (ALLEGRA ) 180 MG tablet Take 1 tablet (180 mg total) by mouth daily. 11/10/24   Tabori, Katherine E, MD  fluticasone  (FLONASE ) 50 MCG/ACT nasal spray SPRAY TWO SPRAYS IN EACH NOSTRIL ONCE DAILY 07/07/24   Tabori, Katherine E, MD  gabapentin  (NEURONTIN ) 600 MG tablet Take 1,200 mg by mouth 2 (two) times daily.     [provider]  glucose blood test strip Use as instructed DX E11.0 06/09/23   Mahlon Comer BRAVO, MD  IBUPROFEN  PO Take by mouth as needed.    [provider]  lamoTRIgine  (LAMICTAL ) 200 MG tablet Take 200 mg by mouth 2 (two) times daily.    [provider]  Lancets [facility] DELICA PLUS [place]) MISC Use twice daily to test blood sugars as directed Dx- uncontrolled DM II 04/07/24   Tabori, Katherine E, MD  losartan  (COZAAR ) 25 MG tablet Take 1 tablet (25 mg total) by mouth daily. 05/16/24   Wyn Jackee VEAR Mickey., NP  LUMIGAN 0.01 % SOLN Place 1 drop into both eyes at bedtime.  10/19/19   [provider]  meclizine  (ANTIVERT ) 25 MG tablet Take 1 tablet (25 mg total) by mouth 3 (three) times daily as needed for dizziness. 07/07/24   Mahlon Comer BRAVO, MD  meloxicam  (MOBIC ) 15 MG tablet Take 1 tablet (15 mg total) by mouth daily as needed for pain. 02/08/24   Gershon Donnice SAUNDERS, DPM  methocarbamol  (ROBAXIN ) 500 MG tablet Take 1 tablet (500 mg total) by mouth every 8 (eight) hours as needed for muscle spasms. 07/29/23   Mahlon Comer BRAVO, MD  montelukast  (SINGULAIR ) 10 MG tablet Take 1 tablet (10 mg total) by mouth at bedtime. 05/27/23   Tabori, Katherine E, MD  Multiple Vitamin (MULTIVITAMIN WITH MINERALS) TABS  tablet Take 1 tablet by mouth daily.    [provider]  naproxen  (NAPROSYN ) 500 MG tablet Take 1 tablet (500 mg total) by mouth 2 (two) times daily with a meal. 09/27/24   Tabori, Katherine E, MD  neomycin-polymyxin b-dexamethasone  (MAXITROL) 3.5-10000-0.1 SUSP daily at 6 (six) AM. 07/19/24   [provider]  omega-3 acid ethyl esters (LOVAZA ) 1 g capsule TAKE 2 CAPSULES BY MOUTH 2 TIMES A DAY 07/03/24   Wyn Jackee VEAR Mickey., NP  PRALUENT  150 MG/ML SOAJ INJECT 1 MILLILITERS INTO THE SKIN EVERY 14 DAYS 11/15/24   Floretta Mallard, MD  Pyridoxine HCl (VITAMIN B-6 PO) Take  1 tablet by mouth daily.    [provider]  QUEtiapine  (SEROQUEL  XR) 400 MG 24 hr tablet Take 400 mg by mouth at bedtime.    [provider]  QUEtiapine  (SEROQUEL ) 50 MG tablet Take 50 mg by mouth daily. 04/29/22   [provider]  Semaglutide ,0.25 or 0.5MG /DOS, (OZEMPIC , 0.25 OR 0.5 MG/DOSE,) 2 MG/3ML SOPN Inject 0.5 mg into the skin once a week. 10/20/24   Tabori, Katherine E, MD  SUMAtriptan  (IMITREX ) 50 MG tablet Take 1 tablet (50 mg total) by mouth once for 1 dose. May repeat in 2 hours if headache persists or recurs. 07/26/23 11/20/24  Tabori, Katherine E, MD  timolol (TIMOPTIC) 0.5 % ophthalmic solution SMARTSIG:In Eye(s) 01/27/22   [provider]  traMADol  (ULTRAM ) 50 MG tablet Take by mouth. 12/16/23   [provider]  venlafaxine  (EFFEXOR -XR) 75 MG 24 hr capsule Take 75 mg by mouth. Take 2 tabs in the am and 1 tab in afternoonn    [provider]    Physical Exam    Vital Signs:  [name] does not have vital signs available for review today.  Given telephonic nature of communication, physical exam is limited. AAOx3. NAD. Normal affect.  Speech and respirations are unlabored.  Accessory Clinical Findings    None  Assessment & Plan    1.  Preoperative Cardiovascular Risk Assessment: According to the Revised Cardiac Risk Index (RCRI), her  Perioperative Risk of Major Cardiac Event is (%): 0.9. Her Functional Capacity in METs is: 5.07 according to the Duke Activity Status Index (DASI). The patient is doing well from a cardiac perspective. Therefore, based on ACC/AHA guidelines, the patient would be at acceptable risk for the planned procedure without further cardiovascular testing.   The patient was advised that if she develops new symptoms prior to surgery to contact our office to arrange for a follow-up visit, and she verbalized understanding.  No request to hold cardiac medications.   A copy of this note will be routed to requesting surgeon.  Time:   Today, I have spent 10 minutes with the patient with telehealth technology discussing medical history, symptoms, and management plan.     Melanie EMERSON Bane, NP-C  11/22/2024, 1:34 PM [address], [address] [place], [place] [address] Office [phone] Fax [phone]      [1]  Allergies Allergen Reactions   Macrobid  [Nitrofurantoin ]     Make her feel crazy   Nitrofurantoin  Monohyd Macro Nausea Only    Severe headache   Statins Other (See Comments)    Severe weakness, pain   Alprazolam Anxiety and Other (See Comments)    Hyper    Amoxicillin  Rash   Prednisone Anxiety   Sulfa  Antibiotics Nausea And Vomiting   "

## 2024-11-24 ENCOUNTER — Other Ambulatory Visit: Payer: Self-pay | Admitting: Nurse Practitioner

## 2024-11-24 ENCOUNTER — Other Ambulatory Visit: Payer: Self-pay | Admitting: Neurosurgery

## 2024-11-24 ENCOUNTER — Other Ambulatory Visit (HOSPITAL_COMMUNITY): Payer: Self-pay

## 2024-11-24 ENCOUNTER — Telehealth: Payer: Self-pay

## 2024-11-24 NOTE — Telephone Encounter (Addendum)
 Pharmacy Patient Advocate Encounter   Received notification from [facility] KEY that prior authorization for Ozempic  (0.25 or 0.5 MG/DOSE) 2MG /3ML pen-injectors is required/requested.   Insurance verification completed.   The patient is insured through [facility].   Per test claim: PA required and submitted KEY/EOC/Request #: [id number] APPROVED from 11/24/24 to 11/08/25. Ran test claim, Copay is $40.00. This test claim was processed through [facility]- copay amounts may vary at other pharmacies due to pharmacy/plan contracts, or as the patient moves through the different stages of their insurance plan.

## 2024-11-28 ENCOUNTER — Ambulatory Visit

## 2024-12-06 NOTE — Progress Notes (Signed)
 Surgical Instructions   Your procedure is scheduled on Tuesday, Feburary 3rd, 2026. Report to [facility] Main Entrance A at 8:30 A.M., then check in with the Admitting office. Any questions or running late day of surgery: call [phone]  Questions prior to your surgery date: call [phone], Monday-Friday, 8am-4pm. If you experience any cold or flu symptoms such as cough, fever, chills, shortness of breath, etc. between now and your scheduled surgery, please notify us  at the above number.     Remember:  Do not eat or drink after midnight the night before your surgery    Take these medicines the morning of surgery with A SIP OF WATER: Clonazepam  (Klonopin ) Fenofibrate  (Trico) Fexofenadine  (Allegra ) Gabapentin  (Neurontin ) Lamotrigine  (Lamictal ) Timolol (Timoptic) eye drop Venlafaxine  (Effexor -XR)   May take these medicines IF NEEDED: Azelastine  (Astelin ) Nasal Spray Buspirone  (Buspar ) Fluticasone  (Flonase ) Olanzaprine (Zyprexa ) Oxycodone -acetaminophen  (Percocet)    One week prior to surgery, STOP taking any Aspirin (unless otherwise instructed by your surgeon) Aleve , Naproxen , Ibuprofen , Motrin , Advil , Goody's, BC's, all herbal medications, fish oil, and non-prescription vitamins.   WHAT DO I DO ABOUT MY DIABETES MEDICATION?   Semalgutide (Ozempic ) should be held for 7 days prior to your surgery.  Your last dose of Semaglutide  (Ozempic ) should be on or before Monday, January 26th.     HOW TO MANAGE YOUR DIABETES BEFORE AND AFTER SURGERY  Why is it important to control my blood sugar before and after surgery? Improving blood sugar levels before and after surgery helps healing and can limit problems. A way of improving blood sugar control is eating a healthy diet by:  Eating less sugar and carbohydrates  Increasing activity/exercise  Talking with your doctor about reaching your blood sugar goals High blood sugars (greater than 180 mg/dL) can raise your risk of  infections and slow your recovery, so you will need to focus on controlling your diabetes during the weeks before surgery. Make sure that the doctor who takes care of your diabetes knows about your planned surgery including the date and location.  How do I manage my blood sugar before surgery? Check your blood sugar at least 4 times a day, starting 2 days before surgery, to make sure that the level is not too high or low.  Check your blood sugar the morning of your surgery when you wake up and every 2 hours until you get to the Short Stay unit.  If your blood sugar is less than 70 mg/dL, you will need to treat for low blood sugar: Do not take insulin. Treat a low blood sugar (less than 70 mg/dL) with  cup of clear juice (cranberry or apple), 4 glucose tablets, OR glucose gel. Recheck blood sugar in 15 minutes after treatment (to make sure it is greater than 70 mg/dL). If your blood sugar is not greater than 70 mg/dL on recheck, call [phone] for further instructions. Report your blood sugar to the short stay nurse when you get to Short Stay.  If you are admitted to the hospital after surgery: Your blood sugar will be checked by the staff and you will probably be given insulin after surgery (instead of oral diabetes medicines) to make sure you have good blood sugar levels. The goal for blood sugar control after surgery is 80-180 mg/dL.                      Do NOT Smoke (Tobacco/Vaping) for 24 hours prior to your procedure.  If you use a CPAP  at night, you may bring your mask/headgear for your overnight stay.   You will be asked to remove any contacts, glasses, piercing's, hearing aid's, dentures/partials prior to surgery. Please bring cases for these items if needed.    Your surgeon will determine if you are to be admitted or discharged the same day.  Patients discharged the day of surgery will not be allowed to drive home, and someone needs to stay with them for 24 hours.  SURGICAL  WAITING ROOM VISITATION Patients may have no more than 2 support people in the waiting area - these visitors may rotate.   Pre-op nurse will coordinate an appropriate time for 2 ADULT support persons, who may not rotate, to accompany patient in pre-op.  Children under the age of 6 must have an adult with them who is not the patient and must remain in the main waiting area with an adult.  If the patient needs to stay at the hospital during part of their recovery, the visitor guidelines for inpatient rooms apply.  Please refer to the [facility] website for the visitor guidelines for any additional information.   If you received a COVID test during your pre-op visit  it is requested that you wear a mask when out in public, stay away from anyone that may not be feeling well and notify your surgeon if you develop symptoms. If you have been in contact with anyone that has tested positive in the last 10 days please notify you surgeon.      Pre-operative 4 CHG Bathing Instructions   You can play a key role in reducing the risk of infection after surgery. Your skin needs to be as free of germs as possible. You can reduce the number of germs on your skin by washing with CHG (chlorhexidine gluconate) soap before surgery. CHG is an antiseptic soap that kills germs and continues to kill germs even after washing.   DO NOT use if you have an allergy to chlorhexidine/CHG or antibacterial soaps. If your skin becomes reddened or irritated, stop using the CHG and notify one of our RNs at [phone].   Please shower with the CHG soap starting 4 days before surgery using the following schedule:     Please keep in mind the following:  DO NOT shave, including legs and underarms, starting the day of your first shower.   You may shave your face at any point before/day of surgery.  Place clean sheets on your bed the day you start using CHG soap. Use a clean washcloth (not used since being washed) for each  shower. DO NOT sleep with pets once you start using the CHG.   CHG Shower Instructions:  Wash your face and private area with normal soap. If you choose to wash your hair, wash first with your normal shampoo.  After you use shampoo/soap, rinse your hair and body thoroughly to remove shampoo/soap residue.  Turn the water OFF and apply  bottle of CHG soap to a CLEAN washcloth.  Apply CHG soap ONLY FROM YOUR NECK DOWN TO YOUR TOES (washing for 3-5 minutes)  DO NOT use CHG soap on face, private areas, open wounds, or sores.  Pay special attention to the area where your surgery is being performed.  If you are having back surgery, having someone wash your back for you may be helpful. Wait 2 minutes after CHG soap is applied, then you may rinse off the CHG soap.  Pat dry with a clean towel  Put  on clean clothes/pajamas   If you choose to wear lotion, please use ONLY the CHG-compatible lotions that are listed below.  Additional instructions for the day of surgery:  If you choose, you may shower the morning of surgery with an antibacterial soap.  DO NOT APPLY any lotions, deodorants, cologne, or perfumes.   Do not bring valuables to the hospital. [facility] is not responsible for any belongings/valuables. Do not wear nail polish, gel polish, artificial nails, or any other type of covering on natural nails (fingers and toes) Do not wear jewelry or makeup Put on clean/comfortable clothes.  Please brush your teeth.  Ask your nurse before applying any prescription medications to the skin.     CHG Compatible Lotions   Aveeno Moisturizing lotion  Cetaphil Moisturizing Cream  Cetaphil Moisturizing Lotion  Clairol Herbal Essence Moisturizing Lotion, Dry Skin  Clairol Herbal Essence Moisturizing Lotion, Extra Dry Skin  Clairol Herbal Essence Moisturizing Lotion, Normal Skin  Curel Age Defying Therapeutic Moisturizing Lotion with Alpha Hydroxy  Curel Extreme Care Body Lotion  Curel Soothing  Hands Moisturizing Hand Lotion  Curel Therapeutic Moisturizing Cream, Fragrance-Free  Curel Therapeutic Moisturizing Lotion, Fragrance-Free  Curel Therapeutic Moisturizing Lotion, Original Formula  Eucerin Daily Replenishing Lotion  Eucerin Dry Skin Therapy Plus Alpha Hydroxy Crme  Eucerin Dry Skin Therapy Plus Alpha Hydroxy Lotion  Eucerin Original Crme  Eucerin Original Lotion  Eucerin Plus Crme Eucerin Plus Lotion  Eucerin TriLipid Replenishing Lotion  Keri Anti-Bacterial Hand Lotion  Keri Deep Conditioning Original Lotion Dry Skin Formula Softly Scented  Keri Deep Conditioning Original Lotion, Fragrance Free Sensitive Skin Formula  Keri Lotion Fast Absorbing Fragrance Free Sensitive Skin Formula  Keri Lotion Fast Absorbing Softly Scented Dry Skin Formula  Keri Original Lotion  Keri Skin Renewal Lotion Keri Silky Smooth Lotion  Keri Silky Smooth Sensitive Skin Lotion  Nivea Body Creamy Conditioning Oil  Nivea Body Extra Enriched Lotion  Nivea Body Original Lotion  Nivea Body Sheer Moisturizing Lotion Nivea Crme  Nivea Skin Firming Lotion  NutraDerm 30 Skin Lotion  NutraDerm Skin Lotion  NutraDerm Therapeutic Skin Cream  NutraDerm Therapeutic Skin Lotion  ProShield Protective Hand Cream  Provon moisturizing lotion  Please read over the following fact sheets that you were given.

## 2024-12-07 ENCOUNTER — Inpatient Hospital Stay (HOSPITAL_COMMUNITY)
Admission: RE | Admit: 2024-12-07 | Discharge: 2024-12-07 | Disposition: A | Discharge status: Home / Self Care | Source: Ambulatory Visit

## 2024-12-08 ENCOUNTER — Encounter (HOSPITAL_COMMUNITY)
Admission: RE | Admit: 2024-12-08 | Discharge: 2024-12-08 | Disposition: A | Source: Ambulatory Visit | Attending: Neurosurgery

## 2024-12-08 ENCOUNTER — Encounter (HOSPITAL_COMMUNITY): Payer: Self-pay

## 2024-12-08 ENCOUNTER — Other Ambulatory Visit: Payer: Self-pay

## 2024-12-08 VITALS — BP 133/86 | HR 75 | Temp 98.6°F | Resp 18 | Ht 69.0 in | Wt 197.0 lb

## 2024-12-08 DIAGNOSIS — Z01812 Encounter for preprocedural laboratory examination: Secondary | ICD-10-CM | POA: Diagnosis present

## 2024-12-08 DIAGNOSIS — E119 Type 2 diabetes mellitus without complications: Secondary | ICD-10-CM | POA: Diagnosis not present

## 2024-12-08 DIAGNOSIS — Z01818 Encounter for other preprocedural examination: Secondary | ICD-10-CM

## 2024-12-08 LAB — BASIC METABOLIC PANEL WITH GFR
Anion gap: 12 (ref 5–15)
BUN: 19 mg/dL (ref 8–23)
CO2: 25 mmol/L (ref 22–32)
Calcium: 9.9 mg/dL (ref 8.9–10.3)
Chloride: 105 mmol/L (ref 98–111)
Creatinine, Ser: 0.7 mg/dL (ref 0.44–1.00)
GFR, Estimated: >60 mL/min
Glucose, Bld: 80 mg/dL (ref 70–99)
Potassium: 4.1 mmol/L (ref 3.5–5.1)
Sodium: 141 mmol/L (ref 135–145)

## 2024-12-08 LAB — SURGICAL PCR SCREEN
MRSA, PCR: NEGATIVE
Staphylococcus aureus: NEGATIVE

## 2024-12-08 LAB — TYPE AND SCREEN
ABO/RH(D): A POS
Antibody Screen: NEGATIVE

## 2024-12-08 LAB — CBC
HCT: 36.9 % (ref 36.0–46.0)
Hemoglobin: 12.5 g/dL (ref 12.0–15.0)
MCH: 30 pg (ref 26.0–34.0)
MCHC: 33.9 g/dL (ref 30.0–36.0)
MCV: 88.5 fL (ref 80.0–100.0)
Platelets: 246 10*3/uL (ref 150–400)
RBC: 4.17 MIL/uL (ref 3.87–5.11)
RDW: 12.9 % (ref 11.5–15.5)
WBC: 3.5 10*3/uL — ABNORMAL LOW (ref 4.0–10.5)
nRBC: 0 % (ref 0.0–0.2)

## 2024-12-08 LAB — HEMOGLOBIN A1C
Hgb A1c MFr Bld: 5.7 % — ABNORMAL HIGH (ref 4.8–5.6)
Mean Plasma Glucose: 116.89 mg/dL

## 2024-12-08 LAB — GLUCOSE, CAPILLARY: Glucose-Capillary: 110 mg/dL — ABNORMAL HIGH (ref 70–99)

## 2024-12-08 NOTE — Progress Notes (Signed)
 PCP - Dr. Comer Greet Cardiologist - Dr. Georganna Archer - last office visit 07/26/2024  PPM/ICD - Denies Device Orders - n/a Rep Notified - n/a  Chest x-ray - n/a EKG - 05/16/2024 Stress Test - 06/23/2024 ECHO - 06/23/2024 Cardiac Cath - Denies  Sleep Study - +OSA. Pt wears CPAP, but not consistently. Unknown pressure settings.  Pt is DM2. She checks her blood sugar 1x/week. Unknown fasting range, but CBG 110 at pre-op appointment and she has only had water today. A1c result pending.  Last dose of GLP1 agonist- Last dose of Ozempic  11/29/2023 GLP1 instructions: Pt instructed to continue holding medication until after surgery  Blood Thinner Instructions: n/a Aspirin Instructions: n/a  NPO after midnight  COVID TEST- n/a   Anesthesia review: Yes. Cardiac Clearance. Pt has also stopped taking her Lovaza  per surgeon's instructions  Patient denies shortness of breath, fever, cough and chest pain at PAT appointment. Pt denies any respiratory illness/infection in the last two months.    All instructions explained to the patient, with a verbal understanding of the material. Patient agrees to go over the instructions while at home for a better understanding. Patient also instructed to self quarantine after being tested for COVID-19. The opportunity to ask questions was provided.

## 2024-12-11 ENCOUNTER — Ambulatory Visit: Admitting: Family Medicine

## 2024-12-11 NOTE — Anesthesia Preprocedure Evaluation (Signed)
"                                    Anesthesia Evaluation  Patient identified by MRN, date of birth, ID band Patient awake    Reviewed: Allergy & Precautions, NPO status , Patient's Chart, lab work & pertinent test results  History of Anesthesia Complications Negative for: history of anesthetic complications  Airway Mallampati: IV  TM Distance: >3 FB Neck ROM: Full    Dental  (+) Dental Advisory Given, Teeth Intact   Pulmonary asthma , sleep apnea and Continuous Positive Airway Pressure Ventilation    Pulmonary exam normal        Cardiovascular hypertension, Pt. on medications Normal cardiovascular exam   '25 Nuc Stress - Fixed anterior perfusion defect with normal wall motion, consistent with artifact.  Low risk study   '25 TTE - EF 55-60%. There is moderate left ventricular hypertrophy. Grade I diastolic dysfunction (impaired relaxation). Mild mitral valve regurgitation. Aortic valve regurgitation is mild.    Neuro/Psych  PSYCHIATRIC DISORDERS Anxiety Depression Bipolar Disorder    Neuromuscular disease    GI/Hepatic Neg liver ROS,GERD  Controlled,,  Endo/Other  diabetes, Type 2   On GLP-1a   Renal/GU negative Renal ROS     Musculoskeletal  (+) Arthritis ,    Abdominal   Peds  (+) ATTENTION DEFICIT DISORDER WITHOUT HYPERACTIVITY Hematology negative hematology ROS (+)   Anesthesia Other Findings   Reproductive/Obstetrics                              Anesthesia Physical Anesthesia Plan  ASA: 3  Anesthesia Plan: General   Post-op Pain Management: Tylenol  PO (pre-op)*   Induction: Intravenous  PONV Risk Score and Plan: 3 and Treatment may vary due to age or medical condition, Ondansetron , Propofol  infusion and Diphenhydramine   Airway Management Planned: Oral ETT  Additional Equipment: None  Intra-op Plan:   Post-operative Plan: Extubation in OR  Informed Consent: I have reviewed the patients History and  Physical, chart, labs and discussed the procedure including the risks, benefits and alternatives for the proposed anesthesia with the patient or authorized representative who has indicated his/her understanding and acceptance.     Dental advisory given  Plan Discussed with: CRNA and Anesthesiologist  Anesthesia Plan Comments: (PAT note by Lynwood Hope, PA-C Glidescope in room)         Anesthesia Quick Evaluation  "

## 2024-12-11 NOTE — Progress Notes (Signed)
 Anesthesia Chart Review:  69 yo female follows with cardiology for hx of OSA on CPAP (inconsistent use), HTN, HLD, LBBB,  mild MR, mild AI. Fixed anterior defect on NM stress test 06/23/24 felt to be artifact. Normal LVEF, no regional wall motion abnormalities on echo 06/2024. Seen by Rosaline Bane, NP 11/22/24 for preop eval. Per note, Preoperative Cardiovascular Risk Assessment: According to the Revised Cardiac Risk Index (RCRI), her Perioperative Risk of Major Cardiac Event is (%): 0.9. Her Functional Capacity in METs is: 5.07 according to the Duke Activity Status Index (DASI). The patient is doing well from a cardiac perspective. Therefore, based on ACC/AHA guidelines, the patient would be at acceptable risk for the planned procedure without further cardiovascular testing.  Other pertinent hx includes NIDDM2, bipolar disorder, GERD, anxiety.   Pt reports LD Ozempic  11/29/23.  Preop labs reviewed, unremarkable. DM2 well controlled, A1c 5.7.  EKG 05/16/24: Sinus with 1st degree block. Rate 75. LBBB. LAD.  Nuclear stress 06/23/24:   Fixed anterior perfusion defect with normal wall motion, consistent with artifact.  Low risk study   The study is normal. The study is low risk.   LV perfusion is abnormal. Defect 1: There is a medium defect with mild reduction in uptake present in the apical to basal anterior location(s) that is fixed. There is normal wall motion in the defect area. Consistent with artifact caused by breast attenuation.   Left ventricular function is normal. End diastolic cavity size is normal. End systolic cavity size is normal.   CT images were obtained for attenuation correction and were examined for the presence of coronary calcium  when appropriate.   Coronary calcium  was present on the attenuation correction CT images. Mild coronary calcifications were present. Coronary calcifications were present in the left anterior descending artery distribution(s).   Prior study not available  for comparison.  TTE 06/23/24: 1. Left ventricular ejection fraction, by estimation, is 55 to 60%. The  left ventricle has normal function. The left ventricle has no regional  wall motion abnormalities. There is moderate left ventricular hypertrophy.  Left ventricular diastolic  parameters are consistent with Grade I diastolic dysfunction (impaired  relaxation). The average left ventricular global longitudinal strain is  -20.2 %. The global longitudinal strain is normal.   2. Right ventricular systolic function is normal. The right ventricular  size is normal.   3. The mitral valve is normal in structure. Mild mitral valve  regurgitation. No evidence of mitral stenosis.   4. The aortic valve is tricuspid. Aortic valve regurgitation is mild. No  aortic stenosis is present.   5. The inferior vena cava is normal in size with greater than 50%  respiratory variability, suggesting right atrial pressure of 3 mmHg.     Lynwood Geofm RIGGERS [facility] Short Stay Center/Anesthesiology Phone [phone] 12/11/2024 10:48 AM

## 2024-12-12 ENCOUNTER — Ambulatory Visit (HOSPITAL_COMMUNITY): Admitting: Anesthesiology

## 2024-12-12 ENCOUNTER — Observation Stay (HOSPITAL_COMMUNITY)
Admission: RE | Admit: 2024-12-12 | Discharge: 2024-12-13 | Disposition: A | Source: Home / Self Care | Attending: Neurosurgery | Admitting: Neurosurgery

## 2024-12-12 ENCOUNTER — Encounter (HOSPITAL_COMMUNITY): Payer: Self-pay | Admitting: Neurosurgery

## 2024-12-12 ENCOUNTER — Ambulatory Visit (HOSPITAL_COMMUNITY)

## 2024-12-12 ENCOUNTER — Encounter (HOSPITAL_COMMUNITY): Admitting: Physician Assistant

## 2024-12-12 ENCOUNTER — Encounter (HOSPITAL_COMMUNITY): Admission: RE | Payer: Self-pay | Source: Home / Self Care

## 2024-12-12 DIAGNOSIS — E119 Type 2 diabetes mellitus without complications: Secondary | ICD-10-CM

## 2024-12-12 DIAGNOSIS — M4310 Spondylolisthesis, site unspecified: Principal | ICD-10-CM | POA: Diagnosis present

## 2024-12-12 LAB — GLUCOSE, CAPILLARY
Glucose-Capillary: 107 mg/dL — ABNORMAL HIGH (ref 70–99)
Glucose-Capillary: 85 mg/dL (ref 70–99)
Glucose-Capillary: 129 mg/dL — ABNORMAL HIGH (ref 70–99)
Glucose-Capillary: 116 mg/dL — ABNORMAL HIGH (ref 70–99)
Glucose-Capillary: 98 mg/dL (ref 70–99)

## 2024-12-12 MED ORDER — CHLORHEXIDINE GLUCONATE 0.12 % MT SOLN
15.0000 mL | Freq: Once | OROMUCOSAL | Status: AC
  Administered 2024-12-12: 15 mL via OROMUCOSAL
  Filled 2024-12-12: qty 15

## 2024-12-12 MED ORDER — LACTATED RINGERS IV SOLN: INTRAVENOUS | Status: DC

## 2024-12-12 MED ORDER — CLONAZEPAM 1 MG PO TABS
2.0000 mg | ORAL_TABLET | Freq: Two times a day (BID) | ORAL | Status: DC
  Administered 2024-12-12 – 2024-12-13 (×2): 2 mg via ORAL
  Filled 2024-12-12 (×2): qty 2

## 2024-12-12 MED ORDER — METHOCARBAMOL 500 MG PO TABS
500.0000 mg | ORAL_TABLET | Freq: Four times a day (QID) | ORAL | Status: DC | PRN
  Administered 2024-12-12 – 2024-12-13 (×2): 500 mg via ORAL
  Filled 2024-12-12 (×3): qty 1

## 2024-12-12 MED ORDER — LIDOCAINE 2% (20 MG/ML) 5 ML SYRINGE
INTRAMUSCULAR | Status: DC | PRN
  Administered 2024-12-12: 60 mg via INTRAVENOUS

## 2024-12-12 MED ORDER — SODIUM CHLORIDE 0.9 % IV SOLN
250.0000 mL | INTRAVENOUS | Status: DC
  Administered 2024-12-12: 250 mL via INTRAVENOUS

## 2024-12-12 MED ORDER — LIDOCAINE 2% (20 MG/ML) 5 ML SYRINGE
INTRAMUSCULAR | Status: AC
  Filled 2024-12-12: qty 5

## 2024-12-12 MED ORDER — ALBUMIN HUMAN 5 % IV SOLN: INTRAVENOUS | Status: DC | PRN

## 2024-12-12 MED ORDER — INSULIN ASPART 100 UNIT/ML IJ SOLN: 0.0000 [IU] | Freq: Three times a day (TID) | INTRAMUSCULAR | Status: DC

## 2024-12-12 MED ORDER — POLYETHYLENE GLYCOL 3350 17 G PO PACK: 17.0000 g | PACK | Freq: Every day | ORAL | Status: DC | PRN

## 2024-12-12 MED ORDER — FLEET ENEMA RE ENEM: 1.0000 | ENEMA | Freq: Once | RECTAL | Status: DC | PRN

## 2024-12-12 MED ORDER — AZELASTINE HCL 0.1 % NA SOLN: 2.0000 | Freq: Two times a day (BID) | NASAL | Status: DC | PRN

## 2024-12-12 MED ORDER — BUPIVACAINE HCL (PF) 0.25 % IJ SOLN
INTRAMUSCULAR | Status: AC
  Filled 2024-12-12: qty 30

## 2024-12-12 MED ORDER — GABAPENTIN 400 MG PO CAPS
1200.0000 mg | ORAL_CAPSULE | Freq: Two times a day (BID) | ORAL | Status: DC
  Administered 2024-12-12 – 2024-12-13 (×2): 1200 mg via ORAL
  Filled 2024-12-12 (×2): qty 3

## 2024-12-12 MED ORDER — 0.9 % SODIUM CHLORIDE (POUR BTL) OPTIME
TOPICAL | Status: DC | PRN
  Administered 2024-12-12: 1000 mL

## 2024-12-12 MED ORDER — INSULIN ASPART 100 UNIT/ML IJ SOLN: 0.0000 [IU] | INTRAMUSCULAR | Status: DC | PRN

## 2024-12-12 MED ORDER — TIMOLOL MALEATE 0.5 % OP SOLN
1.0000 [drp] | Freq: Every day | OPHTHALMIC | Status: DC
  Administered 2024-12-13: 1 [drp] via OPHTHALMIC
  Filled 2024-12-12: qty 5

## 2024-12-12 MED ORDER — OXYCODONE HCL 5 MG/5ML PO SOLN: 5.0000 mg | Freq: Once | ORAL | Status: DC | PRN

## 2024-12-12 MED ORDER — BISACODYL 10 MG RE SUPP: 10.0000 mg | Freq: Every day | RECTAL | Status: DC | PRN

## 2024-12-12 MED ORDER — SODIUM CHLORIDE 0.9% FLUSH: 3.0000 mL | INTRAVENOUS | Status: DC | PRN

## 2024-12-12 MED ORDER — FENOFIBRATE 160 MG PO TABS
160.0000 mg | ORAL_TABLET | Freq: Every day | ORAL | Status: DC
  Administered 2024-12-13: 160 mg via ORAL
  Filled 2024-12-12: qty 1

## 2024-12-12 MED ORDER — ACETAMINOPHEN 500 MG PO TABS
1000.0000 mg | ORAL_TABLET | Freq: Once | ORAL | Status: AC
  Administered 2024-12-12: 1000 mg via ORAL
  Filled 2024-12-12: qty 2

## 2024-12-12 MED ORDER — QUETIAPINE FUMARATE 50 MG PO TABS: 50.0000 mg | ORAL_TABLET | Freq: Every day | ORAL | Status: DC | PRN

## 2024-12-12 MED ORDER — LAMOTRIGINE 100 MG PO TABS
200.0000 mg | ORAL_TABLET | Freq: Two times a day (BID) | ORAL | Status: DC
  Administered 2024-12-12 – 2024-12-13 (×2): 200 mg via ORAL
  Filled 2024-12-12 (×2): qty 2

## 2024-12-12 MED ORDER — SENNOSIDES-DOCUSATE SODIUM 8.6-50 MG PO TABS
1.0000 | ORAL_TABLET | Freq: Two times a day (BID) | ORAL | Status: DC | PRN
  Administered 2024-12-12: 2 via ORAL
  Filled 2024-12-12: qty 2

## 2024-12-12 MED ORDER — FENTANYL CITRATE (PF) 250 MCG/5ML IJ SOLN
INTRAMUSCULAR | Status: AC
  Filled 2024-12-12: qty 5

## 2024-12-12 MED ORDER — FENTANYL CITRATE (PF) 250 MCG/5ML IJ SOLN
INTRAMUSCULAR | Status: DC | PRN
  Administered 2024-12-12: 25 ug via INTRAVENOUS
  Administered 2024-12-12 (×3): 50 ug via INTRAVENOUS

## 2024-12-12 MED ORDER — CHLORHEXIDINE GLUCONATE CLOTH 2 % EX PADS
6.0000 | MEDICATED_PAD | Freq: Once | CUTANEOUS | Status: AC
  Administered 2024-12-12: 6 via TOPICAL

## 2024-12-12 MED ORDER — PHENYLEPHRINE 80 MCG/ML (10ML) SYRINGE FOR IV PUSH (FOR BLOOD PRESSURE SUPPORT)
PREFILLED_SYRINGE | INTRAVENOUS | Status: DC | PRN
  Administered 2024-12-12 (×3): 80 ug via INTRAVENOUS
  Administered 2024-12-12 (×2): 160 ug via INTRAVENOUS

## 2024-12-12 MED ORDER — LOSARTAN POTASSIUM 25 MG PO TABS
25.0000 mg | ORAL_TABLET | Freq: Every day | ORAL | Status: DC
  Administered 2024-12-12 – 2024-12-13 (×2): 25 mg via ORAL
  Filled 2024-12-12 (×2): qty 1

## 2024-12-12 MED ORDER — LORATADINE 10 MG PO TABS
10.0000 mg | ORAL_TABLET | Freq: Every day | ORAL | Status: DC
  Administered 2024-12-13: 10 mg via ORAL
  Filled 2024-12-12 (×2): qty 1

## 2024-12-12 MED ORDER — QUETIAPINE FUMARATE ER 200 MG PO TB24
400.0000 mg | ORAL_TABLET | Freq: Every day | ORAL | Status: DC
  Administered 2024-12-12: 400 mg via ORAL
  Filled 2024-12-12 (×2): qty 2

## 2024-12-12 MED ORDER — VITAMIN B-6 25 MG PO TABS
50.0000 mg | ORAL_TABLET | Freq: Every day | ORAL | Status: DC
  Administered 2024-12-12 – 2024-12-13 (×2): 50 mg via ORAL
  Filled 2024-12-12 (×2): qty 2

## 2024-12-12 MED ORDER — VITAMIN D 25 MCG (1000 UNIT) PO TABS
2000.0000 [IU] | ORAL_TABLET | Freq: Every day | ORAL | Status: DC
  Administered 2024-12-12 – 2024-12-13 (×2): 2000 [IU] via ORAL
  Filled 2024-12-12 (×2): qty 2

## 2024-12-12 MED ORDER — PHENOL 1.4 % MT LIQD: 1.0000 | OROMUCOSAL | Status: DC | PRN

## 2024-12-12 MED ORDER — ROCURONIUM BROMIDE 10 MG/ML (PF) SYRINGE
PREFILLED_SYRINGE | INTRAVENOUS | Status: AC
  Filled 2024-12-12: qty 10

## 2024-12-12 MED ORDER — HYDROMORPHONE HCL 1 MG/ML IJ SOLN: 1.0000 mg | INTRAMUSCULAR | Status: DC | PRN

## 2024-12-12 MED ORDER — OXYCODONE HCL 5 MG PO TABS: 5.0000 mg | ORAL_TABLET | ORAL | Status: DC | PRN

## 2024-12-12 MED ORDER — VENLAFAXINE HCL ER 75 MG PO CP24
150.0000 mg | ORAL_CAPSULE | Freq: Every day | ORAL | Status: DC
  Administered 2024-12-13: 150 mg via ORAL
  Filled 2024-12-12: qty 2

## 2024-12-12 MED ORDER — ROCURONIUM BROMIDE 10 MG/ML (PF) SYRINGE
PREFILLED_SYRINGE | INTRAVENOUS | Status: DC | PRN
  Administered 2024-12-12: 50 mg via INTRAVENOUS
  Administered 2024-12-12: 10 mg via INTRAVENOUS
  Administered 2024-12-12: 50 mg via INTRAVENOUS

## 2024-12-12 MED ORDER — OXYCODONE HCL 5 MG PO TABS
10.0000 mg | ORAL_TABLET | ORAL | Status: DC | PRN
  Administered 2024-12-12 – 2024-12-13 (×3): 10 mg via ORAL
  Filled 2024-12-12 (×4): qty 2

## 2024-12-12 MED ORDER — ONDANSETRON HCL 4 MG/2ML IJ SOLN
INTRAMUSCULAR | Status: DC | PRN
  Administered 2024-12-12: 4 mg via INTRAVENOUS

## 2024-12-12 MED ORDER — ACETAMINOPHEN 325 MG PO TABS: 650.0000 mg | ORAL_TABLET | ORAL | Status: DC | PRN

## 2024-12-12 MED ORDER — PROPOFOL 10 MG/ML IV BOLUS
INTRAVENOUS | Status: AC
  Filled 2024-12-12: qty 20

## 2024-12-12 MED ORDER — ONDANSETRON HCL 4 MG/2ML IJ SOLN
INTRAMUSCULAR | Status: AC
  Filled 2024-12-12: qty 2

## 2024-12-12 MED ORDER — FENTANYL CITRATE (PF) 100 MCG/2ML IJ SOLN
25.0000 ug | INTRAMUSCULAR | Status: DC | PRN
  Administered 2024-12-12: 25 ug via INTRAVENOUS

## 2024-12-12 MED ORDER — PROPOFOL 10 MG/ML IV BOLUS
INTRAVENOUS | Status: DC | PRN
  Administered 2024-12-12: 140 mg via INTRAVENOUS

## 2024-12-12 MED ORDER — BUPIVACAINE HCL (PF) 0.25 % IJ SOLN
INTRAMUSCULAR | Status: DC | PRN
  Administered 2024-12-12: 20 mL

## 2024-12-12 MED ORDER — OMEGA-3-ACID ETHYL ESTERS 1 G PO CAPS
1.0000 g | ORAL_CAPSULE | Freq: Every day | ORAL | Status: DC
  Administered 2024-12-12 – 2024-12-13 (×2): 1 g via ORAL
  Filled 2024-12-12 (×2): qty 1

## 2024-12-12 MED ORDER — SODIUM CHLORIDE 0.9% FLUSH
3.0000 mL | Freq: Two times a day (BID) | INTRAVENOUS | Status: DC
  Administered 2024-12-12: 3 mL via INTRAVENOUS

## 2024-12-12 MED ORDER — VENLAFAXINE HCL ER 75 MG PO CP24: 75.0000 mg | ORAL_CAPSULE | ORAL | Status: DC

## 2024-12-12 MED ORDER — EPHEDRINE SULFATE-NACL 50-0.9 MG/10ML-% IV SOSY
PREFILLED_SYRINGE | INTRAVENOUS | Status: DC | PRN
  Administered 2024-12-12: 10 mg via INTRAVENOUS
  Administered 2024-12-12: 15 mg via INTRAVENOUS

## 2024-12-12 MED ORDER — ONDANSETRON HCL 4 MG/2ML IJ SOLN: 4.0000 mg | Freq: Once | INTRAMUSCULAR | Status: DC | PRN

## 2024-12-12 MED ORDER — SUGAMMADEX SODIUM 200 MG/2ML IV SOLN
INTRAVENOUS | Status: DC | PRN
  Administered 2024-12-12: 200 mg via INTRAVENOUS

## 2024-12-12 MED ORDER — MENTHOL 3 MG MT LOZG: 1.0000 | LOZENGE | OROMUCOSAL | Status: DC | PRN

## 2024-12-12 MED ORDER — ACETAMINOPHEN 650 MG RE SUPP: 650.0000 mg | RECTAL | Status: DC | PRN

## 2024-12-12 MED ORDER — CEFAZOLIN SODIUM-DEXTROSE 2-4 GM/100ML-% IV SOLN
2.0000 g | INTRAVENOUS | Status: AC
  Administered 2024-12-12: 2 g via INTRAVENOUS
  Filled 2024-12-12: qty 100

## 2024-12-12 MED ORDER — ONDANSETRON HCL 4 MG/2ML IJ SOLN: 4.0000 mg | Freq: Four times a day (QID) | INTRAMUSCULAR | Status: DC | PRN

## 2024-12-12 MED ORDER — PHENYLEPHRINE HCL-NACL 20-0.9 MG/250ML-% IV SOLN
INTRAVENOUS | Status: DC | PRN
  Administered 2024-12-12: 40 ug/min via INTRAVENOUS

## 2024-12-12 MED ORDER — CEFAZOLIN SODIUM-DEXTROSE 1-4 GM/50ML-% IV SOLN
1.0000 g | Freq: Three times a day (TID) | INTRAVENOUS | Status: AC
  Administered 2024-12-12 – 2024-12-13 (×2): 1 g via INTRAVENOUS
  Filled 2024-12-12 (×2): qty 50

## 2024-12-12 MED ORDER — VANCOMYCIN HCL 1000 MG IV SOLR
INTRAVENOUS | Status: DC | PRN
  Administered 2024-12-12: 1000 mg

## 2024-12-12 MED ORDER — OLANZAPINE 10 MG PO TABS: 10.0000 mg | ORAL_TABLET | Freq: Every day | ORAL | Status: DC | PRN

## 2024-12-12 MED ORDER — FENTANYL CITRATE (PF) 100 MCG/2ML IJ SOLN
INTRAMUSCULAR | Status: AC
  Filled 2024-12-12: qty 2

## 2024-12-12 MED ORDER — OXYCODONE HCL 5 MG PO TABS: 5.0000 mg | ORAL_TABLET | Freq: Once | ORAL | Status: DC | PRN

## 2024-12-12 MED ORDER — VANCOMYCIN HCL 1000 MG IV SOLR
INTRAVENOUS | Status: AC
  Filled 2024-12-12: qty 20

## 2024-12-12 MED ORDER — ORAL CARE MOUTH RINSE: 15.0000 mL | Freq: Once | OROMUCOSAL | Status: AC

## 2024-12-12 MED ORDER — LATANOPROST 0.005 % OP SOLN
1.0000 [drp] | Freq: Every day | OPHTHALMIC | Status: DC
  Filled 2024-12-12: qty 2.5

## 2024-12-12 MED ORDER — CHLORHEXIDINE GLUCONATE CLOTH 2 % EX PADS: 6.0000 | MEDICATED_PAD | Freq: Once | CUTANEOUS | Status: DC

## 2024-12-12 MED ORDER — METHOCARBAMOL 1000 MG/10ML IJ SOLN: 500.0000 mg | Freq: Four times a day (QID) | INTRAMUSCULAR | Status: DC | PRN

## 2024-12-12 MED ORDER — ONDANSETRON HCL 4 MG PO TABS: 4.0000 mg | ORAL_TABLET | Freq: Four times a day (QID) | ORAL | Status: DC | PRN

## 2024-12-12 MED ORDER — HYDROCODONE-ACETAMINOPHEN 10-325 MG PO TABS: 1.0000 | ORAL_TABLET | ORAL | Status: DC | PRN

## 2024-12-12 MED ORDER — MIDAZOLAM HCL 2 MG/2ML IJ SOLN
INTRAMUSCULAR | Status: AC
  Filled 2024-12-12: qty 2

## 2024-12-12 MED ORDER — VENLAFAXINE HCL ER 75 MG PO CP24
75.0000 mg | ORAL_CAPSULE | Freq: Every day | ORAL | Status: DC
  Administered 2024-12-12: 75 mg via ORAL
  Filled 2024-12-12: qty 1

## 2024-12-12 MED ORDER — PHENYLEPHRINE 80 MCG/ML (10ML) SYRINGE FOR IV PUSH (FOR BLOOD PRESSURE SUPPORT)
PREFILLED_SYRINGE | INTRAVENOUS | Status: AC
  Filled 2024-12-12: qty 10

## 2024-12-12 MED ORDER — THROMBIN 20000 UNITS EX SOLR
CUTANEOUS | Status: DC | PRN
  Administered 2024-12-12: 20 mL via TOPICAL

## 2024-12-12 MED ORDER — THROMBIN 20000 UNITS EX SOLR
CUTANEOUS | Status: AC
  Filled 2024-12-12: qty 20000

## 2024-12-12 NOTE — Transfer of Care (Signed)
 Immediate Anesthesia Transfer of Care Note  Patient: [name]  Procedure(s) Performed: POSTERIOR LUMBAR FUSION LUMBAR FOUR-FIVE (Back)  Patient Location: PACU  Anesthesia Type:General  Level of Consciousness: awake and sedated  Airway & Oxygen Therapy: Patient Spontanous Breathing and Patient connected to face mask oxygen  Post-op Assessment: Report given to RN and Post -op Vital signs reviewed and stable  Post vital signs: Reviewed and stable  Last Vitals:  Vitals Value Taken Time  BP 123/57 12/12/24 13:50  Temp    Pulse 88 12/12/24 13:54  Resp 15 12/12/24 13:54  SpO2 90 % 12/12/24 13:54  Vitals shown include unfiled device data.  Last Pain:  Vitals:   12/12/24 0908  TempSrc:   PainSc: 0-No pain         Complications: No notable events documented.

## 2024-12-12 NOTE — Op Note (Signed)
 Date of procedure: 12/12/2024  Date of dictation: Same  Service: Neurosurgery  Preoperative diagnosis: L4-5 unstable degenerative spondylolisthesis with stenosis and neurogenic claudication  Postoperative diagnosis: Same  Procedure Name: Bilateral L4-5 decompressive laminotomies and foraminotomies, more than would be required for simple interbody fusion alone.  Bilateral L4-5 ponte osteotomies for sagittal plane restoration and reduction of deformity  L4-5 posterior lumbar interbody fusion utilizing interbody cages, local harvested autograft, and morselized allograft  L4-5 posterior lateral fusion utilizing nonsegmental pedicle screw fixation and local autografting.  Surgeon:Doris Mcgilvery A.Daliya Parchment, M.D.  Asst. Surgeon: Jennetta, NP  Assistant utilized for exposure, decompression, fusion, instrumentation and closure  Anesthesia: General  Indication: 69 year old female remotely status post L5-S1 discectomy presents with severe back and bilateral lower extremity pain numbness and weakness failing conservative management her workup demonstrates evidence of unstable degenerative spondylolisthesis with severe stenosis at L4-5.  Patient has failed conservative management presents now for L4-5 decompression and fusion.  Operative note: After induction of anesthesia, patient positioned prone on the Wilson frame and appropriately padded.  Lumbar region prepped and draped sterilely.  Incision made overlying L4-5.  Dissection performed bilaterally.  Retractor placed.  Fluoroscopy used.  Levels confirmed.  Decompressive laminotomies and facetectomies were then performed using Leksell rongeur's, Kerrison rongeur's and the high-speed drill to remove the inferior two thirds of the lamina of L4 bilaterally.  The inferior facets of L4 and the pars interarticularis of L4 were resected bilaterally.  The superior articular process of L5 was partially resected as was the superior aspect of the L5 lamina.  Ligamentum flavum  elevated and resected in a piecemeal fashion.  Wide decompressive foraminotomies performed on the course the exiting L4 and L5 nerve roots bilaterally.  Because the patient's deformity further lateral release was performed completing the superior facetectomy of L5 thus completing a ponte osteotomy which allowed for mobilization and reduction of her sagittal plane deformity.  Bilateral discectomy was then performed at L4-5.  The space is then prepared for interbody fusion.  With the disc base distracted the disc base was cleaned of all soft tissue.  A 9 mm Medtronic expandable cage was then impacted in place and expanded.  Distractor removed patient's right side.  The space prepared on the right side.  Morselized autograft packed in the interspace.  A second cage was then impacted into place and expanded.  Pedicles of L4 and L5 were identified using surface landmarks and intraoperative fluoroscopy and superficial bone around the pedicle was then removed using high-speed drill.  Pedicle was then probed using a pedicle awl.  Pedicle tract was then probed and found to be solidly within the bone.  5.5 mm Medtronic Solera screws were placed bilaterally at L4.  6.5 screws were placed bilaterally at L5.  Final images reveal good position of the cages and the hardware at the proper level with normal alignment of the spine.  Each cage was then packed with demineralized bone fibers.  Short segment titanium rod placed of the screws at L4 and L5.  Locking caps placed over the screws.  Locking caps then engaged with the construct under mild compression.  Transverse processes of L4 and L5 were decorticated.  Morselized autograft was packed posterolaterally for later fusion.  Vancomycin  powder placed the deep wound space.  Wounds then closed in layers with Vicryl sutures.  Steri-Strips and sterile dressing were applied.  No apparent complications.  Patient tolerated the procedure well and she returns to the recovery room  postop.

## 2024-12-12 NOTE — Brief Op Note (Signed)
 12/12/2024 10:30 AM  1:30 PM  PATIENT:  [name]  69 y.o. female  PRE-OPERATIVE DIAGNOSIS:  Degenerative spondylolisthesis  POST-OPERATIVE DIAGNOSIS:  Degenerative spondylolisthesis  PROCEDURE:  Procedures with comments: POSTERIOR LUMBAR FUSION LUMBAR FOUR-FIVE (N/A) - PLIF - L4-L5 - Posterior Lateral and Interbody fusion  SURGEON:  Surgeons and Role:    DEWAINE Louis Shove, MD - Primary  PHYSICIAN ASSISTANT:   ASSISTANTS:  Bergman,N  ANES: General  DRAINS: none   LOCAL MEDICATIONS USED:  MARCAINE      SPECIMEN:  No Specimen  DISPOSITION OF SPECIMEN:  N/A  COUNTS:  YES  TOURNIQUET:  * No tourniquets in log *  DICTATION: .Dragon Dictation  PLAN OF CARE: Admit for overnight observation  PATIENT DISPOSITION:  PACU - hemodynamically stable.   Delay start of Pharmacological VTE agent (>24hrs) due to surgical blood loss or risk of bleeding: yes

## 2024-12-12 NOTE — Anesthesia Procedure Notes (Signed)
 Procedure Name: Intubation Date/Time: 12/12/2024 11:08 AM  Performed by: Worth Peppers, CRNAPre-anesthesia Checklist: Patient identified, Emergency Drugs available, Suction available and Patient being monitored Patient Re-evaluated:Patient Re-evaluated prior to induction Oxygen Delivery Method: Circle System Utilized Preoxygenation: Pre-oxygenation with 100% oxygen Induction Type: IV induction Ventilation: Mask ventilation without difficulty Laryngoscope Size: Mac and 3 Grade View: Grade I Tube type: Oral Number of attempts: 1 Airway Equipment and Method: Stylet and Oral airway Placement Confirmation: ETT inserted through vocal cords under direct vision, positive ETCO2 and breath sounds checked- equal and bilateral Secured at: 22 cm Tube secured with: Tape Dental Injury: Teeth and Oropharynx as per pre-operative assessment

## 2024-12-12 NOTE — H&P (Signed)
 " [name] is an 69 y.o. female.   Chief Complaint: Back pain HPI: 69 year old female with severe lumbar pain with radiation of both lower extremities worsened with standing and ambulation.  Workup demonstrates evidence of unstable degenerative spondylolisthesis at L4-5 with severe stenosis.  Patient has failed conservative management and presents now for decompression and fusion surgery.  She is remotely status post left side L5-S1 laminectomy and discectomy many years ago.  Past Medical History:  Diagnosis Date   Allergic rhinitis    Allergy    Anxiety    Arthritis    R knee, Left Knee   Asthma    Mild   Bipolar disorder (HCC)    Cataract    Complication of anesthesia    had bad anxiety after anesthesia   Constipation, chronic    Depression    Diabetes mellitus without complication (HCC)    Dysrhythmia    Palpations occ, takes Tenormin    GERD (gastroesophageal reflux disease)    Glaucoma    Heart murmur    Hx of adenomatous colonic polyps 2008   Hyperlipidemia    Hypertension    MVP (mitral valve prolapse)    Pneumonia    as a baby   Sleep apnea    wears CPAP   Urinary tract infection    frequent, see Urololgist 12/16/2010    Past Surgical History:  Procedure Laterality Date   ABDOMINAL HYSTERECTOMY  2002   no oophorectomy   BACK SURGERY  2001   L5 - S1   CATARACT EXTRACTION     COLONOSCOPY     NASAL SINUS SURGERY  01/2010   x2   TONSILLECTOMY     TOTAL KNEE ARTHROPLASTY  12/23/2011   Procedure: TOTAL KNEE ARTHROPLASTY;  Surgeon: Toribio JULIANNA Chancy, MD;  Location: [facility] OR;  Service: Orthopedics;  Laterality: Right;  DR MURPHY WANTS 120 MINUTES FOR SURGERY   TOTAL KNEE ARTHROPLASTY  08/31/2012   Procedure: TOTAL KNEE ARTHROPLASTY;  Surgeon: Toribio JULIANNA Chancy, MD;  Location: [facility] OR;  Service: Orthopedics;  Laterality: Left;    Family History  Problem Relation Age of Onset   Lung disease Mother        Mycobacterium avium intracellulare   Coronary artery disease  Father    Heart attack Father 72   Heart disease Father    Hypertension Brother    Hyperlipidemia Brother    Alcohol abuse Brother    Anxiety disorder Brother    Healthy Brother    Diabetes Maternal Grandmother    Stroke Maternal Grandmother    Stroke Maternal Grandfather    Heart attack Paternal Grandmother    Anesthesia problems Neg Hx    Colon cancer Neg Hx    Esophageal cancer Neg Hx    Rectal cancer Neg Hx    Stomach cancer Neg Hx    Social History:  reports that she has never smoked. She has never been exposed to tobacco smoke. She has never used smokeless tobacco. She reports that she does not drink alcohol and does not use drugs.  Allergies: Allergies[1]  Medications Prior to Admission  Medication Sig Dispense Refill   azelastine  (ASTELIN ) 0.1 % nasal spray PLACE 2 SPRAYS INTO EACH NOSTRIL TWO TIMES A DAY AS DIRECTED (Patient taking differently: Place 2 sprays into both nostrils 2 (two) times daily as needed for rhinitis or allergies. PLACE 2 SPRAYS INTO EACH NOSTRIL TWO TIMES A DAY AS DIRECTED) 30 mL 2   Cholecalciferol  (VITAMIN D3) 50 MCG (2000  UT) CAPS Take 2,000 Units by mouth daily.     clonazePAM  (KLONOPIN ) 2 MG tablet Take 2 mg by mouth 2 (two) times daily.     fenofibrate  (TRICOR ) 145 MG tablet Take 1 tablet (145 mg total) by mouth daily. 90 tablet 1   fexofenadine  (ALLEGRA ) 180 MG tablet Take 1 tablet (180 mg total) by mouth daily. 90 tablet 3   fluticasone  (FLONASE ) 50 MCG/ACT nasal spray SPRAY TWO SPRAYS IN EACH NOSTRIL ONCE DAILY (Patient taking differently: Place 2 sprays into both nostrils daily as needed for allergies.) 16 g 3   gabapentin  (NEURONTIN ) 600 MG tablet Take 1,200 mg by mouth 2 (two) times daily.      ibuprofen  (ADVIL ) 200 MG tablet Take 600 mg by mouth every 8 (eight) hours as needed for moderate pain (pain score 4-6).     lamoTRIgine  (LAMICTAL ) 200 MG tablet Take 200 mg by mouth 2 (two) times daily.     losartan  (COZAAR ) 25 MG tablet TAKE 1 TABLET  BY MOUTH DAILY 90 tablet 1   LUMIGAN 0.01 % SOLN Place 1 drop into both eyes at bedtime.      omega-3 acid ethyl esters (LOVAZA ) 1 g capsule TAKE 2 CAPSULES BY MOUTH 2 TIMES A DAY 120 capsule 3   oxyCODONE -acetaminophen  (PERCOCET/ROXICET) 5-325 MG tablet Take 1 tablet by mouth every 6 (six) hours as needed for moderate pain (pain score 4-6).     PRALUENT  150 MG/ML SOAJ INJECT 1 MILLILITERS INTO THE SKIN EVERY 14 DAYS 6 mL 1   Pyridoxine  HCl (VITAMIN B-6 PO) Take 1 tablet by mouth daily.     QUEtiapine  (SEROQUEL  XR) 400 MG 24 hr tablet Take 400 mg by mouth at bedtime.     QUEtiapine  (SEROQUEL ) 50 MG tablet Take 50 mg by mouth daily as needed (anxiety/agitation).     Semaglutide ,0.25 or 0.5MG /DOS, (OZEMPIC , 0.25 OR 0.5 MG/DOSE,) 2 MG/3ML SOPN Inject 0.5 mg into the skin once a week. 3 mL 1   timolol  (TIMOPTIC ) 0.5 % ophthalmic solution Place 1 drop into both eyes daily.     venlafaxine  (EFFEXOR -XR) 75 MG 24 hr capsule Take 75-150 mg by mouth See admin instructions. Take 150 mg by mouth in the morning and 75 mg in afternoonn     blood glucose meter kit and supplies KIT Dispense based on patient and insurance preference. Use up to four times daily as directed. 1 each 0   busPIRone  (BUSPAR ) 15 MG tablet Take 15 mg by mouth 2 (two) times daily as needed (anxiety). (Patient not taking: Reported on 12/12/2024)     doxycycline  (VIBRA -TABS) 100 MG tablet Take 1 tablet (100 mg total) by mouth 2 (two) times daily. (Patient not taking: Reported on 12/04/2024) 14 tablet 0   glucose blood test strip Use as instructed DX E11.0 100 each 12   Lancets (ONETOUCH DELICA PLUS LANCET30G) MISC Use twice daily to test blood sugars as directed Dx- uncontrolled DM II 100 each 1   meclizine  (ANTIVERT ) 25 MG tablet Take 1 tablet (25 mg total) by mouth 3 (three) times daily as needed for dizziness. (Patient not taking: Reported on 12/04/2024) 30 tablet 0   meloxicam  (MOBIC ) 15 MG tablet Take 1 tablet (15 mg total) by mouth daily as  needed for pain. (Patient not taking: Reported on 12/04/2024) 30 tablet 0   methocarbamol  (ROBAXIN ) 500 MG tablet Take 1 tablet (500 mg total) by mouth every 8 (eight) hours as needed for muscle spasms. (Patient not taking: Reported on 12/04/2024)  30 tablet 0   montelukast  (SINGULAIR ) 10 MG tablet Take 1 tablet (10 mg total) by mouth at bedtime. (Patient not taking: Reported on 12/04/2024) 90 tablet 1   naproxen  (NAPROSYN ) 500 MG tablet Take 1 tablet (500 mg total) by mouth 2 (two) times daily with a meal. (Patient not taking: Reported on 12/04/2024) 60 tablet 0   OLANZapine  (ZYPREXA ) 10 MG tablet Take 10 mg by mouth daily as needed (anxiety).     SUMAtriptan  (IMITREX ) 50 MG tablet Take 1 tablet (50 mg total) by mouth once for 1 dose. May repeat in 2 hours if headache persists or recurs. (Patient not taking: Reported on 12/04/2024) 10 tablet 1    Results for orders placed or performed during the hospital encounter of 12/12/24 (from the past 48 hours)  Glucose, capillary     Status: Abnormal   Collection Time: 12/12/24  8:44 AM  Result Value Ref Range   Glucose-Capillary 107 (H) 70 - 99 mg/dL    Comment: Glucose reference range applies only to samples taken after fasting for at least 8 hours.   Comment 1 Notify RN    *Note: Due to a large number of results and/or encounters for the requested time period, some results have not been displayed. A complete set of results can be found in Results Review.   No results found.  Pertinent items noted in HPI and remainder of comprehensive ROS otherwise negative.  Blood pressure (!) 146/86, pulse 76, temperature 98.8 F (37.1 C), temperature source Oral, resp. rate 18, height 5' 9 (1.753 m), weight 89.4 kg, last menstrual period 11/09/2000, SpO2 95%.  Patient is awake and alert.  She is oriented and appropriate.  Speech is fluent.  Judgment insight are intact.  Cranial nerve function normal bilateral.  Motor examination reveals intact motor strength  bilaterally.  Sensory study shows some patchy distal sensory loss in both lower extremities.  Reflexes are hypoactive but symmetric.  No evidence of long track signs.  Gait is antalgic.  Posture is mildly flexed.  Examination head ears eyes nose and throat is unremarkable chest and abdomen are benign.  Extremities are free of major deformity. Assessment/Plan L4-5 unstable degenerative spondylolisthesis with stenosis and neurogenic claudication.  Plan bilateral L4-5 decompressive laminotomies and foraminotomies followed by posterior lumbar interbody fusion utilizing interbody cages, local harvested autograft, and augmented with posterolateral thesis utilizing nonsegmental pedicle screw fixation and local autograft.  Risks and benefits been explained.  Patient wishes to proceed.  [name] Cataleyah Colborn 12/12/2024, 10:46 AM       [1]  Allergies Allergen Reactions   Macrobid  [Nitrofurantoin ]     Make her feel crazy   Nitrofurantoin  Monohyd Macro Nausea Only    Severe headache   Statins Other (See Comments)    Severe weakness, pain   Alprazolam Anxiety and Other (See Comments)    Hyper    Amoxicillin  Rash   Prednisone Anxiety   Sulfa  Antibiotics Nausea And Vomiting   "

## 2024-12-13 LAB — GLUCOSE, CAPILLARY: Glucose-Capillary: 97 mg/dL (ref 70–99)

## 2024-12-13 MED ORDER — METHOCARBAMOL 500 MG PO TABS: 500.0000 mg | ORAL_TABLET | Freq: Four times a day (QID) | ORAL | 0 refills | Status: AC | PRN

## 2024-12-13 MED ORDER — POLYETHYLENE GLYCOL 3350 17 G PO PACK: 17.0000 g | PACK | Freq: Every day | ORAL | 0 refills | Status: AC

## 2024-12-13 MED ORDER — OXYCODONE-ACETAMINOPHEN 5-325 MG PO TABS: 1.0000 | ORAL_TABLET | Freq: Four times a day (QID) | ORAL | 0 refills | Status: AC | PRN

## 2024-12-13 NOTE — Care Management Obs Status (Signed)
 MEDICARE OBSERVATION STATUS NOTIFICATION   Patient Details  Name: [name] MRN: [id number] Date of Birth: 23-Mar-1956   Medicare Observation Status Notification Given:     Obs notice signed and a copy given.   Lonie Rummell 12/13/2024, 9:25 AM

## 2024-12-13 NOTE — Discharge Summary (Signed)
 " Physician Discharge Summary     Providing Compassionate, Quality Care - Together   Patient ID: [name] MRN: [id number] DOB/AGE: Apr 08, 1956 69 y.o.  Admit date: 12/12/2024 Discharge date: 12/13/2024  Admission Diagnoses: Degenerative spondylolisthesis  Discharge Diagnoses:  Principal Problem:   Degenerative spondylolisthesis   Discharged Condition: good  Hospital Course: Patient underwent an L4-5 PLIF by Dr. Louis on 12/12/2024. She was admitted to 3C06 following recovery from anesthesia in the PACU. Her postoperative course has been uncomplicated. She has worked with both physical and occupational therapies who feel the patient is ready for discharge home. She is ambulating independently and without difficulty. She is tolerating a normal diet. She is not having any bowel or bladder dysfunction. Her pain is well-controlled with oral pain medication. She is ready for discharge home.   Consults: PT/OT/TOC  Significant Diagnostic Studies: radiology: DG Lumbar Spine 2-3 Views Result Date: 12/12/2024 CLINICAL DATA:  Elective surgery EXAM: LUMBAR SPINE - 2-3 VIEW COMPARISON:  Radiographs 05/11/2024 FINDINGS: Two fluoroscopic spot views of the lumbar spine submitted from the operating room. Pedicle screws at L4 and L5 with interbody spacer. Fluoroscopy time 37 seconds. Dose 46.93 mGy. IMPRESSION: Intraoperative fluoroscopy during lumbar fusion. Electronically Signed   By: Andrea Gasman M.D.   On: 12/12/2024 15:03   DG C-Arm 1-60 Min-No Report Result Date: 12/12/2024 Fluoroscopy was utilized by the requesting physician.  No radiographic interpretation.   DG C-Arm 1-60 Min-No Report Result Date: 12/12/2024 Fluoroscopy was utilized by the requesting physician.  No radiographic interpretation.     Treatments: surgery:  Bilateral L4-5 decompressive laminotomies and foraminotomies, more than would be required for simple interbody fusion alone.   Bilateral L4-5 ponte osteotomies for  sagittal plane restoration and reduction of deformity   L4-5 posterior lumbar interbody fusion utilizing interbody cages, local harvested autograft, and morselized allograft   L4-5 posterior lateral fusion utilizing nonsegmental pedicle screw fixation and local autografting.  Discharge Exam: Blood pressure (!) 109/59, pulse 96, temperature 98.4 F (36.9 C), temperature source Oral, resp. rate 18, height 5' 9 (1.753 m), weight 89.4 kg, last menstrual period 11/09/2000, SpO2 93%.  Alert and oriented x 4 PERRLA CN II-XII grossly intact MAE, Strength and sensation intact Incision is covered with Honeycomb dressing and Steri Strips; Dressing is clean, dry, and intact  Disposition: Discharge disposition: 01-Home or Self Care        Allergies as of 12/13/2024       Reactions   Macrobid  [nitrofurantoin ]    Make her feel crazy   Nitrofurantoin  Monohyd Macro Nausea Only   Severe headache   Statins Other (See Comments)   Severe weakness, pain   Alprazolam Anxiety, Other (See Comments)   Hyper    Amoxicillin  Rash   Prednisone Anxiety   Sulfa  Antibiotics Nausea And Vomiting        Medication List     STOP taking these medications    ibuprofen  200 MG tablet Commonly known as: ADVIL    meclizine  25 MG tablet Commonly known as: ANTIVERT    meloxicam  15 MG tablet Commonly known as: MOBIC    naproxen  500 MG tablet Commonly known as: Naprosyn        TAKE these medications    azelastine  0.1 % nasal spray Commonly known as: ASTELIN  PLACE 2 SPRAYS INTO EACH NOSTRIL TWO TIMES A DAY AS DIRECTED What changed:  how much to take how to take this when to take this reasons to take this   blood glucose meter kit and supplies Kit  Dispense based on patient and insurance preference. Use up to four times daily as directed.   busPIRone  15 MG tablet Commonly known as: BUSPAR  Take 15 mg by mouth 2 (two) times daily as needed (anxiety).   clonazePAM  2 MG tablet Commonly known  as: KLONOPIN  Take 2 mg by mouth 2 (two) times daily.   doxycycline  100 MG tablet Commonly known as: VIBRA -TABS Take 1 tablet (100 mg total) by mouth 2 (two) times daily.   fenofibrate  145 MG tablet Commonly known as: TRICOR  Take 1 tablet (145 mg total) by mouth daily.   fexofenadine  180 MG tablet Commonly known as: ALLEGRA  Take 1 tablet (180 mg total) by mouth daily.   fluticasone  50 MCG/ACT nasal spray Commonly known as: FLONASE  SPRAY TWO SPRAYS IN EACH NOSTRIL ONCE DAILY What changed:  how much to take how to take this when to take this reasons to take this additional instructions   gabapentin  600 MG tablet Commonly known as: NEURONTIN  Take 1,200 mg by mouth 2 (two) times daily.   glucose blood test strip Use as instructed DX E11.0   lamoTRIgine  200 MG tablet Commonly known as: LAMICTAL  Take 200 mg by mouth 2 (two) times daily.   losartan  25 MG tablet Commonly known as: COZAAR  TAKE 1 TABLET BY MOUTH DAILY   Lumigan 0.01 % Soln Generic drug: bimatoprost Place 1 drop into both eyes at bedtime.   methocarbamol  500 MG tablet Commonly known as: ROBAXIN  Take 1 tablet (500 mg total) by mouth every 6 (six) hours as needed for muscle spasms. What changed: when to take this   montelukast  10 MG tablet Commonly known as: SINGULAIR  Take 1 tablet (10 mg total) by mouth at bedtime.   OLANZapine  10 MG tablet Commonly known as: ZYPREXA  Take 10 mg by mouth daily as needed (anxiety).   omega-3 acid ethyl esters 1 g capsule Commonly known as: LOVAZA  TAKE 2 CAPSULES BY MOUTH 2 TIMES A DAY   OneTouch Delica Plus Lancet30G Misc Use twice daily to test blood sugars as directed Dx- uncontrolled DM II   oxyCODONE -acetaminophen  5-325 MG tablet Commonly known as: Percocet Take 1-2 tablets by mouth every 6 (six) hours as needed. What changed:  how much to take reasons to take this   Ozempic  (0.25 or 0.5 MG/DOSE) 2 MG/3ML Sopn Generic drug: Semaglutide (0.25 or  0.5MG /DOS) Inject 0.5 mg into the skin once a week.   polyethylene glycol 17 g packet Commonly known as: MiraLax  Take 17 g by mouth daily.   Praluent  150 MG/ML Soaj Generic drug: Alirocumab  INJECT 1 MILLILITERS INTO THE SKIN EVERY 14 DAYS   QUEtiapine  400 MG 24 hr tablet Commonly known as: SEROQUEL  XR Take 400 mg by mouth at bedtime.   QUEtiapine  50 MG tablet Commonly known as: SEROQUEL  Take 50 mg by mouth daily as needed (anxiety/agitation).   SUMAtriptan  50 MG tablet Commonly known as: Imitrex  Take 1 tablet (50 mg total) by mouth once for 1 dose. May repeat in 2 hours if headache persists or recurs.   timolol  0.5 % ophthalmic solution Commonly known as: TIMOPTIC  Place 1 drop into both eyes daily.   venlafaxine  XR 75 MG 24 hr capsule Commonly known as: EFFEXOR -XR Take 75-150 mg by mouth See admin instructions. Take 150 mg by mouth in the morning and 75 mg in afternoonn   VITAMIN B-6 PO Take 1 tablet by mouth daily.   vitamin D3 50 MCG (2000 UT) Caps Take 2,000 Units by mouth daily.  Durable Medical Equipment  (From admission, onward)           Start     Ordered   12/12/24 1634  DME Walker rolling  Once       Question:  Patient needs a walker to treat with the following condition  Answer:  Degenerative spondylolisthesis   12/12/24 1633   12/12/24 1634  DME 3 n 1  Once        12/12/24 1633            Follow-up Information     Louis Shove, MD Follow up.   Specialty: Neurosurgery Contact information: [address] [place] [address] [phone]                 Signed: Gerard Beck, DNP, AGNP-C Nurse Practitioner  [facility] [address], [address], [place], [place] [address] P: [phone]    F: [phone]  12/13/2024, 10:55 AM  "

## 2024-12-13 NOTE — TOC Transition Note (Signed)
 Transition of Care [facility]) - Discharge Note   Patient Details  Name: [name] MRN: [id number] Date of Birth: 11-14-1955  [facility] CM/SW Contact:  Andrez JULIANNA George, RN Phone Number: 12/13/2024, 12:09 PM   Clinical Narrative:     Pt is discharging home with home health through [facility]. [facility]'s information on the AVS. [facility] will contact her for the first home visit.  Pt has transportation home.  Final next level of care: Home w Home Health Services     Patient Goals and CMS Choice   CMS Medicare.gov Compare Post Acute Care list provided to:: Patient        Discharge Placement                       Discharge Plan and Services Additional resources added to the After Visit Summary for                            [facility] Arranged: PT, OT HH Agency: [facility] Date [facility] Agency Contacted: 12/13/24   Representative spoke with at [facility] Agency: [facility]  Social Drivers of Health (SDOH) Interventions SDOH Screenings   Food Insecurity: No Food Insecurity (08/23/2024)  Housing: Unknown (08/23/2024)  Transportation Needs: No Transportation Needs (08/23/2024)  Utilities: Not At Risk (08/23/2024)  Alcohol Screen: Low Risk (08/23/2024)  Depression (PHQ2-9): Low Risk (08/23/2024)  Financial Resource Strain: Medium Risk (08/23/2024)  Physical Activity: Sufficiently Active (08/23/2024)  Social Connections: Socially Isolated (08/23/2024)  Stress: No Stress Concern Present (08/23/2024)  Tobacco Use: Low Risk (12/12/2024)  Health Literacy: Adequate Health Literacy (08/23/2024)     Readmission Risk Interventions     No data to display

## 2024-12-13 NOTE — Anesthesia Postprocedure Evaluation (Signed)
"   Anesthesia Post Note  Patient: [name]  Procedure(s) Performed: POSTERIOR LUMBAR FUSION LUMBAR FOUR-FIVE (Back)     Patient location during evaluation: PACU Anesthesia Type: General Level of consciousness: awake and alert Pain management: pain level controlled Vital Signs Assessment: post-procedure vital signs reviewed and stable Respiratory status: spontaneous breathing, nonlabored ventilation, respiratory function stable and patient connected to nasal cannula oxygen Cardiovascular status: blood pressure returned to baseline and stable Postop Assessment: no apparent nausea or vomiting Anesthetic complications: no   No notable events documented.  Last Vitals:  Vitals:   12/13/24 0405 12/13/24 0749  BP: 109/70 (!) 109/59  Pulse: (!) 103 96  Resp: 18 18  Temp: 37.7 C 36.9 C  SpO2: 92% 93%                    Debby FORBES Like      "

## 2024-12-13 NOTE — Discharge Instructions (Signed)
Wound Care Keep incision covered and dry until post op day 3. You may remove the Honeycomb dressing on post op day 3. Leave steri-strips on back.  They will fall off by themselves. Do not put any creams, lotions, or ointments on incision. You are fine to shower. Let water run over incision and pat dry.  Activity Walk each and every day, increasing distance each day. No lifting greater than 5 lbs.  Avoid excessive back motion. No driving for 2 weeks; may ride as a passenger locally.  Diet Resume your normal diet.   Return to Work Will be discussed at your follow up appointment.  Call Your Doctor If Any of These Occur Redness, drainage, or swelling at the wound.  Temperature greater than 101 degrees. Severe pain not relieved by pain medication. Incision starts to come apart.  Follow Up Appt Call [phone] today for appointment in 2-3 weeks if you don't already have one or for any problems.

## 2024-12-13 NOTE — Evaluation (Signed)
 Occupational Therapy Evaluation Patient Details Name: [name] MRN: [id number] DOB: Apr 17, 1956 Today's Date: 12/13/2024   History of Present Illness   Pt is a 69 y.o. female s/p PLIF L4-5. PMH: back surgery, arthritis, bipolar, DM, dysrhythmia, GERD, HLD, HTN, mitral valve prolapse, sleep apnea.     Clinical Impressions PTA, pt lived alone and was mod I. Brother staying with her for 1 week after discharge. Upon eval, pt performing UB ADL with up to set-up A and LB ADL with up to Mod A. Pt educated and demonstrating use of compensatory techniques for bed mobility, LB ADL, grooming, toileting, and shower transfers within precautions. Per pt, brother able to provide physical assist as needed at home. Pt to continue to benefit from acute OT services.       If plan is discharge home, recommend the following:   A little help with walking and/or transfers;A little help with bathing/dressing/bathroom;Assistance with cooking/housework;Assist for transportation;Help with stairs or ramp for entrance     Functional Status Assessment   Patient has had a recent decline in their functional status and demonstrates the ability to make significant improvements in function in a reasonable and predictable amount of time.     Equipment Recommendations   BSC/3in1     Recommendations for Other Services         Precautions/Restrictions   Precautions Precautions: Back Precaution Booklet Issued: Yes (comment) Recall of Precautions/Restrictions: Impaired Precaution/Restrictions Comments: all precautions reviewed during BADL. pt slow processing she reports going on since waking from surgery. handout provided and plans for brother to be home with her Restrictions Weight Bearing Restrictions Per Provider Order: No     Mobility Bed Mobility Overal bed mobility: Needs Assistance Bed Mobility: Rolling, Sidelying to Sit Rolling: Contact guard assist Sidelying to sit: Contact guard assist,  Used rails       General bed mobility comments: significantly increased time, cues for optimal technique    Transfers Overall transfer level: Needs assistance Equipment used: None Transfers: Sit to/from Stand Sit to Stand: Contact guard assist           General transfer comment: close guard for safety      Balance Overall balance assessment: Needs assistance Sitting-balance support: No upper extremity supported, Feet supported Sitting balance-Leahy Scale: Good     Standing balance support: No upper extremity supported, During functional activity Standing balance-Leahy Scale: Poor Standing balance comment: close guard for safety                           ADL either performed or assessed with clinical judgement   ADL Overall ADL's : Needs assistance/impaired Eating/Feeding: Independent;Sitting   Grooming: Supervision/safety;Standing   Upper Body Bathing: Set up;Sitting   Lower Body Bathing: Moderate assistance;Sit to/from stand   Upper Body Dressing : Set up;Sitting   Lower Body Dressing: Moderate assistance;Sit to/from stand   Toilet Transfer: Contact guard assist;Ambulation   Toileting- Clothing Manipulation and Hygiene: Set up;Sitting/lateral lean   Tub/ Shower Transfer: Contact guard assist;Adhering to back precautions;Ambulation;Shower Field Seismologist Details (indicate cue type and reason): cues for technique Functional mobility during ADLs: Contact guard assist       Vision Patient Visual Report: No change from baseline Vision Assessment?: No apparent visual deficits;Wears glasses for reading     Perception Perception: Not tested       Praxis Praxis: Not tested       Pertinent Vitals/Pain Pain Assessment Pain Assessment: Faces Faces  Pain Scale: Hurts even more Pain Location: back Pain Descriptors / Indicators: Aching, Pins and needles Pain Intervention(s): Limited activity within patient's tolerance, Monitored during  session     Extremity/Trunk Assessment Upper Extremity Assessment Upper Extremity Assessment: Overall WFL for tasks assessed   Lower Extremity Assessment Lower Extremity Assessment: Defer to PT evaluation   Cervical / Trunk Assessment Cervical / Trunk Assessment: Back Surgery   Communication Communication Communication: No apparent difficulties   Cognition Arousal: Alert Behavior During Therapy: WFL for tasks assessed/performed Cognition: No family/caregiver present to determine baseline             OT - Cognition Comments: pt with slow processing                 Following commands: Intact       Cueing  General Comments   Cueing Techniques: Verbal cues  VSS   Exercises     Shoulder Instructions      Home Living Family/patient expects to be discharged to:: Private residence Living Arrangements: Alone Available Help at Discharge: Family;Available 24 hours/day (brother to stay 1 week) Type of Home: Apartment Home Access: Stairs to enter Entrance Stairs-Number of Steps: 7 Entrance Stairs-Rails: Right;Left;Can reach both Home Layout: One level     Bathroom Shower/Tub: Chief Strategy Officer: Standard (comfort)     Home Equipment: Cane - single point;Shower seat          Prior Functioning/Environment Prior Level of Function : Independent/Modified Independent             Mobility Comments: recent use of cane ADLs Comments: independent per report    OT Problem List: Decreased strength;Decreased activity tolerance;Impaired balance (sitting and/or standing);Decreased knowledge of precautions;Decreased knowledge of use of DME or AE   OT Treatment/Interventions: Self-care/ADL training;Therapeutic exercise;DME and/or AE instruction;Therapeutic activities;Patient/family education;Balance training      OT Goals(Current goals can be found in the care plan section)   Acute Rehab OT Goals Patient Stated Goal: get better OT Goal  Formulation: With patient Time For Goal Achievement: 12/27/24 Potential to Achieve Goals: Good   OT Frequency:  Min 2X/week    Co-evaluation              AM-PAC OT 6 Clicks Daily Activity     Outcome Measure Help from another person eating meals?: None Help from another person taking care of personal grooming?: A Little Help from another person toileting, which includes using toliet, bedpan, or urinal?: A Little Help from another person bathing (including washing, rinsing, drying)?: A Lot Help from another person to put on and taking off regular upper body clothing?: A Little Help from another person to put on and taking off regular lower body clothing?: A Lot 6 Click Score: 17   End of Session Equipment Utilized During Treatment: Gait belt;Back brace Nurse Communication: Mobility status  Activity Tolerance: Patient tolerated treatment well Patient left: with call bell/phone within reach;in chair  OT Visit Diagnosis: Unsteadiness on feet (R26.81);Muscle weakness (generalized) (M62.81);Pain                Time: 0825-0901 OT Time Calculation (min): 36 min Charges:  OT General Charges $OT Visit: 1 Visit OT Evaluation $OT Eval Low Complexity: 1 Low OT Treatments $Self Care/Home Management : 8-22 mins  Elma JONETTA Lebron FREDERICK, OTR/L [facility] Acute Rehabilitation Office: [phone]   Elma JONETTA Lebron 12/13/2024, 11:03 AM

## 2024-12-13 NOTE — Evaluation (Signed)
 Physical Therapy Evaluation  Patient Details Name: [name] MRN: [id number] DOB: 11/07/1956 Today's Date: 12/13/2024  History of Present Illness  Pt is a 69 y.o. female s/p PLIF L4-5. PMH: back surgery, arthritis, bipolar, DM, dysrhythmia, GERD, HLD, HTN, mitral valve prolapse, sleep apnea.  Clinical Impression  Pt admitted with above diagnosis. At the time of PT eval, pt was able to demonstrate transfers and ambulation with gross min assist and RW for support. Pt was educated on precautions, brace application/wearing schedule, appropriate activity progression, and car transfer. Pt with decreased cognition this morning and impulsive/restless. She is unsteady on her feet and requires assist to prevent fall, especially when not using a RW for support. Pt currently with functional limitations due to the deficits listed below (see PT Problem List). Pt will benefit from skilled PT to increase their independence and safety with mobility to allow discharge to the venue listed below.          If plan is discharge home, recommend the following: A little help with walking and/or transfers;A little help with bathing/dressing/bathroom;Assistance with cooking/housework;Assist for transportation;Help with stairs or ramp for entrance   Can travel by private vehicle        Equipment Recommendations Rolling walker (2 wheels)  Recommendations for Other Services       Functional Status Assessment Patient has had a recent decline in their functional status and demonstrates the ability to make significant improvements in function in a reasonable and predictable amount of time.     Precautions / Restrictions Precautions Precautions: Back Precaution Booklet Issued: Yes (comment) Recall of Precautions/Restrictions: Impaired Precaution/Restrictions Comments: all precautions reviewed during BADL. pt slow processing she reports going on since waking from surgery. handout provided and plans for brother to be  home with her Restrictions Weight Bearing Restrictions Per Provider Order: No      Mobility  Bed Mobility               General bed mobility comments: Pt was received sitting up in the recliner.    Transfers Overall transfer level: Needs assistance Equipment used: None Transfers: Sit to/from Stand Sit to Stand: Min assist           General transfer comment: VC's for hand placement on seated surface for safety. Pt was able to power up to full stand with min assist for balance support.    Ambulation/Gait Ambulation/Gait assistance: Min assist Gait Distance (Feet): 75 Feet Assistive device: Rolling walker (2 wheels), None Gait Pattern/deviations: Step-through pattern, Decreased stride length, Trunk flexed, Staggering left Gait velocity: Decreased Gait velocity interpretation: <1.31 ft/sec, indicative of household ambulator   General Gait Details: Pt initially without AD and then attempted with RW. Pt was much steadier with RW. She required assist for balance support and safety throughout OOB mobility.  Stairs Stairs: Yes Stairs assistance: Min assist Stair Management: One rail Right, Alternating pattern, Step to pattern, Forwards Number of Stairs: 7 General stair comments: VC's for sequencing and general safety. Assist required for balance.  Wheelchair Mobility     Tilt Bed    Modified Rankin (Stroke Patients Only)       Balance Overall balance assessment: Needs assistance Sitting-balance support: No upper extremity supported, Feet supported Sitting balance-Leahy Scale: Good     Standing balance support: No upper extremity supported, During functional activity Standing balance-Leahy Scale: Poor Standing balance comment: close guard for safety  Pertinent Vitals/Pain Pain Assessment Pain Assessment: Faces Faces Pain Scale: Hurts even more Pain Location: back Pain Descriptors / Indicators: Aching, Pins and  needles Pain Intervention(s): Limited activity within patient's tolerance, Monitored during session, Repositioned    Home Living Family/patient expects to be discharged to:: Private residence Living Arrangements: Alone Available Help at Discharge: Family;Available 24 hours/day (brother to stay 1 week) Type of Home: Apartment Home Access: Stairs to enter Entrance Stairs-Rails: Right;Left;Can reach both Entrance Stairs-Number of Steps: 7   Home Layout: One level Home Equipment: Cane - single point;Shower seat      Prior Function Prior Level of Function : Independent/Modified Independent             Mobility Comments: recent use of cane ADLs Comments: independent per report     Extremity/Trunk Assessment   Upper Extremity Assessment Upper Extremity Assessment: Defer to OT evaluation    Lower Extremity Assessment Lower Extremity Assessment: Generalized weakness (Mild; consistent with pre-op diagnosis)    Cervical / Trunk Assessment Cervical / Trunk Assessment: Back Surgery  Communication   Communication Communication: No apparent difficulties    Cognition Arousal: Alert Behavior During Therapy: Restless, Impulsive   PT - Cognitive impairments: Awareness, Attention, Initiation, Sequencing, Problem solving, Safety/Judgement                       PT - Cognition Comments: Likely due to meds Following commands: Intact       Cueing Cueing Techniques: Verbal cues     General Comments General comments (skin integrity, edema, etc.): VSS    Exercises     Assessment/Plan    PT Assessment Patient needs continued PT services  PT Problem List Decreased strength;Decreased activity tolerance;Decreased balance;Decreased mobility;Decreased knowledge of use of DME;Decreased safety awareness;Decreased knowledge of precautions;Pain       PT Treatment Interventions DME instruction;Gait training;Functional mobility training;Stair training;Therapeutic  activities;Therapeutic exercise;Balance training;Patient/family education    PT Goals (Current goals can be found in the Care Plan section)  Acute Rehab PT Goals Patient Stated Goal: Home today PT Goal Formulation: With patient Time For Goal Achievement: 12/20/24 Potential to Achieve Goals: Good    Frequency Min 5X/week     Co-evaluation               AM-PAC PT 6 Clicks Mobility  Outcome Measure Help needed turning from your back to your side while in a flat bed without using bedrails?: A Little Help needed moving from lying on your back to sitting on the side of a flat bed without using bedrails?: A Little Help needed moving to and from a bed to a chair (including a wheelchair)?: A Little Help needed standing up from a chair using your arms (e.g., wheelchair or bedside chair)?: A Little Help needed to walk in hospital room?: A Little Help needed climbing 3-5 steps with a railing? : A Little 6 Click Score: 18    End of Session Equipment Utilized During Treatment: Gait belt;Back brace Activity Tolerance: Patient tolerated treatment well Patient left: in chair;with call bell/phone within reach Nurse Communication: Mobility status PT Visit Diagnosis: Unsteadiness on feet (R26.81);Pain Pain - part of body:  (back)    Time: 9071-9053 PT Time Calculation (min) (ACUTE ONLY): 18 min   Charges:   PT Evaluation $PT Eval Low Complexity: 1 Low   PT General Charges $$ ACUTE PT VISIT: 1 Visit         [name], PT, DPT Acute Rehabilitation Services Secure Chat Preferred  Office: [phone]   [name] 12/13/2024, 12:18 PM

## 2025-05-03 ENCOUNTER — Ambulatory Visit: Admitting: Adult Health

## 2025-09-18 ENCOUNTER — Ambulatory Visit
